# Patient Record
Sex: Female | Born: 1935
Health system: Southern US, Community
[De-identification: ages and names within clinical notes are randomized; demographics above are authoritative.]

## PROBLEM LIST (undated history)

## (undated) DIAGNOSIS — I2510 Atherosclerotic heart disease of native coronary artery without angina pectoris: Secondary | ICD-10-CM

## (undated) DIAGNOSIS — E785 Hyperlipidemia, unspecified: Secondary | ICD-10-CM

## (undated) DIAGNOSIS — M545 Low back pain, unspecified: Secondary | ICD-10-CM

## (undated) DIAGNOSIS — F419 Anxiety disorder, unspecified: Secondary | ICD-10-CM

## (undated) DIAGNOSIS — R413 Other amnesia: Secondary | ICD-10-CM

## (undated) DIAGNOSIS — J189 Pneumonia, unspecified organism: Secondary | ICD-10-CM

## (undated) DIAGNOSIS — Z952 Presence of prosthetic heart valve: Secondary | ICD-10-CM

## (undated) DIAGNOSIS — M1990 Unspecified osteoarthritis, unspecified site: Secondary | ICD-10-CM

## (undated) DIAGNOSIS — J449 Chronic obstructive pulmonary disease, unspecified: Secondary | ICD-10-CM

## (undated) DIAGNOSIS — R918 Other nonspecific abnormal finding of lung field: Secondary | ICD-10-CM

## (undated) DIAGNOSIS — M797 Fibromyalgia: Secondary | ICD-10-CM

## (undated) DIAGNOSIS — Z9849 Cataract extraction status, unspecified eye: Secondary | ICD-10-CM

## (undated) DIAGNOSIS — I350 Nonrheumatic aortic (valve) stenosis: Secondary | ICD-10-CM

## (undated) DIAGNOSIS — I872 Venous insufficiency (chronic) (peripheral): Secondary | ICD-10-CM

## (undated) DIAGNOSIS — K869 Disease of pancreas, unspecified: Secondary | ICD-10-CM

## (undated) HISTORY — DX: Chronic obstructive pulmonary disease, unspecified: J44.9

## (undated) HISTORY — DX: Anxiety disorder, unspecified: F41.9

## (undated) HISTORY — PX: EYE SURGERY: SHX253

## (undated) HISTORY — DX: Venous insufficiency (chronic) (peripheral): I87.2

## (undated) HISTORY — DX: Low back pain: M54.5

## (undated) HISTORY — DX: Hyperlipidemia, unspecified: E78.5

## (undated) HISTORY — PX: LUMBAR LAMINECTOMY: SHX95

## (undated) HISTORY — DX: Cataract extraction status, unspecified eye: Z98.49

## (undated) HISTORY — DX: Other amnesia: R41.3

## (undated) HISTORY — DX: Fibromyalgia: M79.7

## (undated) HISTORY — DX: Low back pain, unspecified: M54.50

## (undated) HISTORY — DX: Disease of pancreas, unspecified: K86.9

## (undated) HISTORY — PX: OTHER SURGICAL HISTORY: SHX169

## (undated) HISTORY — DX: Other nonspecific abnormal finding of lung field: R91.8

## (undated) HISTORY — DX: Unspecified osteoarthritis, unspecified site: M19.90

## (undated) HISTORY — PX: APPENDECTOMY: SHX54

## (undated) HISTORY — PX: CATARACT EXTRACTION: SUR2

## (undated) HISTORY — PX: CORONARY STENT INTERVENTION: CATH118234

## (undated) HISTORY — DX: Nonrheumatic aortic (valve) stenosis: I35.0

## (undated) HISTORY — PX: ABDOMINAL HYSTERECTOMY: SHX81

## (undated) HISTORY — PX: ANTERIOR CERVICAL DISCECTOMY: SHX1160

---

## 1999-01-23 ENCOUNTER — Other Ambulatory Visit: Admission: RE | Admit: 1999-01-23 | Discharge: 1999-01-23 | Payer: Self-pay | Admitting: Obstetrics and Gynecology

## 1999-02-25 ENCOUNTER — Encounter: Payer: Self-pay | Admitting: Obstetrics and Gynecology

## 1999-02-25 ENCOUNTER — Ambulatory Visit (HOSPITAL_COMMUNITY): Admission: RE | Admit: 1999-02-25 | Discharge: 1999-02-25 | Payer: Self-pay | Admitting: Obstetrics and Gynecology

## 2000-07-09 ENCOUNTER — Encounter: Payer: Self-pay | Admitting: Neurosurgery

## 2000-07-09 ENCOUNTER — Ambulatory Visit (HOSPITAL_COMMUNITY): Admission: RE | Admit: 2000-07-09 | Discharge: 2000-07-09 | Payer: Self-pay

## 2000-08-05 ENCOUNTER — Ambulatory Visit (HOSPITAL_COMMUNITY): Admission: RE | Admit: 2000-08-05 | Discharge: 2000-08-05 | Payer: Self-pay | Admitting: Neurosurgery

## 2000-08-05 ENCOUNTER — Encounter: Payer: Self-pay | Admitting: Neurosurgery

## 2000-08-19 ENCOUNTER — Encounter: Payer: Self-pay | Admitting: Neurosurgery

## 2000-08-19 ENCOUNTER — Ambulatory Visit (HOSPITAL_COMMUNITY): Admission: RE | Admit: 2000-08-19 | Discharge: 2000-08-19 | Payer: Self-pay | Admitting: Neurosurgery

## 2000-09-07 ENCOUNTER — Encounter: Payer: Self-pay | Admitting: Neurosurgery

## 2000-09-07 ENCOUNTER — Ambulatory Visit (HOSPITAL_COMMUNITY): Admission: RE | Admit: 2000-09-07 | Discharge: 2000-09-07 | Payer: Self-pay | Admitting: Neurosurgery

## 2002-05-19 ENCOUNTER — Emergency Department (HOSPITAL_COMMUNITY): Admission: EM | Admit: 2002-05-19 | Discharge: 2002-05-20 | Payer: Self-pay | Admitting: Emergency Medicine

## 2002-05-20 ENCOUNTER — Encounter: Payer: Self-pay | Admitting: Emergency Medicine

## 2002-05-28 ENCOUNTER — Encounter: Payer: Self-pay | Admitting: Neurosurgery

## 2002-05-28 ENCOUNTER — Ambulatory Visit (HOSPITAL_COMMUNITY): Admission: RE | Admit: 2002-05-28 | Discharge: 2002-05-28 | Payer: Self-pay | Admitting: Neurosurgery

## 2002-06-17 ENCOUNTER — Encounter: Payer: Self-pay | Admitting: Neurosurgery

## 2002-06-21 ENCOUNTER — Encounter: Payer: Self-pay | Admitting: Neurosurgery

## 2002-06-21 ENCOUNTER — Inpatient Hospital Stay (HOSPITAL_COMMUNITY): Admission: RE | Admit: 2002-06-21 | Discharge: 2002-06-23 | Payer: Self-pay | Admitting: Neurosurgery

## 2002-09-06 ENCOUNTER — Ambulatory Visit (HOSPITAL_COMMUNITY): Admission: RE | Admit: 2002-09-06 | Discharge: 2002-09-06 | Payer: Self-pay | Admitting: Pulmonary Disease

## 2002-09-06 ENCOUNTER — Encounter: Payer: Self-pay | Admitting: Pulmonary Disease

## 2004-09-21 ENCOUNTER — Ambulatory Visit: Payer: Self-pay | Admitting: Internal Medicine

## 2004-09-24 ENCOUNTER — Ambulatory Visit: Payer: Self-pay | Admitting: Pulmonary Disease

## 2004-10-15 ENCOUNTER — Ambulatory Visit: Payer: Self-pay | Admitting: Pulmonary Disease

## 2005-07-08 ENCOUNTER — Ambulatory Visit: Payer: Self-pay | Admitting: Pulmonary Disease

## 2005-09-25 ENCOUNTER — Ambulatory Visit: Payer: Self-pay | Admitting: Pulmonary Disease

## 2005-10-22 ENCOUNTER — Ambulatory Visit: Payer: Self-pay | Admitting: Pulmonary Disease

## 2006-05-11 ENCOUNTER — Ambulatory Visit (HOSPITAL_COMMUNITY): Admission: RE | Admit: 2006-05-11 | Discharge: 2006-05-11 | Payer: Self-pay | Admitting: Pulmonary Disease

## 2006-05-22 ENCOUNTER — Encounter: Admission: RE | Admit: 2006-05-22 | Discharge: 2006-05-22 | Payer: Self-pay | Admitting: Pulmonary Disease

## 2006-06-23 ENCOUNTER — Ambulatory Visit (HOSPITAL_COMMUNITY): Admission: RE | Admit: 2006-06-23 | Discharge: 2006-06-23 | Payer: Self-pay | Admitting: Neurosurgery

## 2006-07-01 ENCOUNTER — Ambulatory Visit (HOSPITAL_COMMUNITY): Admission: RE | Admit: 2006-07-01 | Discharge: 2006-07-01 | Payer: Self-pay | Admitting: Neurosurgery

## 2006-07-20 ENCOUNTER — Ambulatory Visit (HOSPITAL_COMMUNITY): Admission: RE | Admit: 2006-07-20 | Discharge: 2006-07-20 | Payer: Self-pay | Admitting: Neurosurgery

## 2006-09-23 ENCOUNTER — Ambulatory Visit: Payer: Self-pay | Admitting: Pulmonary Disease

## 2006-10-27 HISTORY — PX: OTHER SURGICAL HISTORY: SHX169

## 2006-11-26 ENCOUNTER — Encounter: Admission: RE | Admit: 2006-11-26 | Discharge: 2006-11-26 | Payer: Self-pay | Admitting: Orthopedic Surgery

## 2006-11-30 ENCOUNTER — Ambulatory Visit (HOSPITAL_BASED_OUTPATIENT_CLINIC_OR_DEPARTMENT_OTHER): Admission: RE | Admit: 2006-11-30 | Discharge: 2006-12-01 | Payer: Self-pay | Admitting: Orthopedic Surgery

## 2007-02-05 ENCOUNTER — Ambulatory Visit: Payer: Self-pay | Admitting: Pulmonary Disease

## 2007-02-05 LAB — CONVERTED CEMR LAB
ALT: 12 units/L (ref 0–40)
Albumin: 3.6 g/dL (ref 3.5–5.2)
Alkaline Phosphatase: 117 units/L (ref 39–117)
BUN: 12 mg/dL (ref 6–23)
Basophils Relative: 0 % (ref 0.0–1.0)
Calcium: 9.2 mg/dL (ref 8.4–10.5)
Chloride: 106 meq/L (ref 96–112)
Glucose, Bld: 103 mg/dL — ABNORMAL HIGH (ref 70–99)
Hemoglobin: 13.6 g/dL (ref 12.0–15.0)
MCHC: 35.3 g/dL (ref 30.0–36.0)
MCV: 91 fL (ref 78.0–100.0)
Monocytes Absolute: 0.4 10*3/uL (ref 0.2–0.7)
Monocytes Relative: 3 % (ref 3.0–11.0)
Neutro Abs: 11.6 10*3/uL — ABNORMAL HIGH (ref 1.4–7.7)
Platelets: 343 10*3/uL (ref 150–400)
Potassium: 3.9 meq/L (ref 3.5–5.1)
RBC: 4.24 M/uL (ref 3.87–5.11)
RDW: 12.7 % (ref 11.5–14.6)
Total Bilirubin: 0.8 mg/dL (ref 0.3–1.2)
WBC: 13.2 10*3/uL — ABNORMAL HIGH (ref 4.5–10.5)

## 2007-02-16 ENCOUNTER — Ambulatory Visit: Payer: Self-pay | Admitting: Pulmonary Disease

## 2007-05-24 ENCOUNTER — Encounter: Admission: RE | Admit: 2007-05-24 | Discharge: 2007-05-24 | Payer: Self-pay | Admitting: Pulmonary Disease

## 2007-06-15 ENCOUNTER — Ambulatory Visit: Payer: Self-pay | Admitting: Pulmonary Disease

## 2007-07-14 ENCOUNTER — Emergency Department (HOSPITAL_COMMUNITY): Admission: EM | Admit: 2007-07-14 | Discharge: 2007-07-14 | Payer: Self-pay | Admitting: Emergency Medicine

## 2007-08-31 ENCOUNTER — Ambulatory Visit: Payer: Self-pay | Admitting: Pulmonary Disease

## 2007-09-07 ENCOUNTER — Ambulatory Visit: Payer: Self-pay | Admitting: Pulmonary Disease

## 2007-09-08 LAB — CONVERTED CEMR LAB: Creatinine, Ser: 0.7 mg/dL (ref 0.4–1.2)

## 2007-09-09 ENCOUNTER — Ambulatory Visit (HOSPITAL_COMMUNITY): Admission: RE | Admit: 2007-09-09 | Discharge: 2007-09-09 | Payer: Self-pay | Admitting: Pulmonary Disease

## 2007-10-19 ENCOUNTER — Encounter: Payer: Self-pay | Admitting: Pulmonary Disease

## 2007-12-14 DIAGNOSIS — R42 Dizziness and giddiness: Secondary | ICD-10-CM | POA: Insufficient documentation

## 2007-12-14 DIAGNOSIS — F411 Generalized anxiety disorder: Secondary | ICD-10-CM

## 2007-12-14 DIAGNOSIS — Z87448 Personal history of other diseases of urinary system: Secondary | ICD-10-CM | POA: Insufficient documentation

## 2007-12-15 ENCOUNTER — Ambulatory Visit: Payer: Self-pay | Admitting: Pulmonary Disease

## 2007-12-15 DIAGNOSIS — R079 Chest pain, unspecified: Secondary | ICD-10-CM

## 2007-12-15 DIAGNOSIS — H5501 Congenital nystagmus: Secondary | ICD-10-CM | POA: Insufficient documentation

## 2007-12-15 DIAGNOSIS — M545 Low back pain: Secondary | ICD-10-CM

## 2007-12-15 DIAGNOSIS — J209 Acute bronchitis, unspecified: Secondary | ICD-10-CM | POA: Insufficient documentation

## 2007-12-15 DIAGNOSIS — I872 Venous insufficiency (chronic) (peripheral): Secondary | ICD-10-CM | POA: Insufficient documentation

## 2007-12-15 DIAGNOSIS — IMO0001 Reserved for inherently not codable concepts without codable children: Secondary | ICD-10-CM

## 2007-12-19 DIAGNOSIS — R413 Other amnesia: Secondary | ICD-10-CM | POA: Insufficient documentation

## 2008-02-17 ENCOUNTER — Encounter: Payer: Self-pay | Admitting: Pulmonary Disease

## 2008-06-12 ENCOUNTER — Ambulatory Visit: Payer: Self-pay | Admitting: Pulmonary Disease

## 2008-06-12 DIAGNOSIS — M1990 Unspecified osteoarthritis, unspecified site: Secondary | ICD-10-CM | POA: Insufficient documentation

## 2008-06-12 LAB — CONVERTED CEMR LAB
ALT: 12 units/L (ref 0–35)
AST: 17 units/L (ref 0–37)
Albumin: 3.9 g/dL (ref 3.5–5.2)
Alkaline Phosphatase: 112 units/L (ref 39–117)
Basophils Absolute: 0 10*3/uL (ref 0.0–0.1)
Basophils Relative: 0.5 % (ref 0.0–3.0)
Bilirubin, Direct: 0.1 mg/dL (ref 0.0–0.3)
Creatinine, Ser: 0.9 mg/dL (ref 0.4–1.2)
Eosinophils Absolute: 0.1 10*3/uL (ref 0.0–0.7)
Eosinophils Relative: 2 % (ref 0.0–5.0)
GFR calc Af Amer: 77 mL/min
GFR calc non Af Amer: 64 mL/min
MCV: 93.4 fL (ref 78.0–100.0)
Monocytes Relative: 6.8 % (ref 3.0–12.0)
Platelets: 272 10*3/uL (ref 150–400)
RDW: 12.4 % (ref 11.5–14.6)
Sodium: 143 meq/L (ref 135–145)
TSH: 2.24 microintl units/mL (ref 0.35–5.50)
Total Bilirubin: 0.8 mg/dL (ref 0.3–1.2)
Total CHOL/HDL Ratio: 5.1
Triglycerides: 113 mg/dL (ref 0–149)
VLDL: 23 mg/dL (ref 0–40)

## 2008-07-06 ENCOUNTER — Encounter: Payer: Self-pay | Admitting: Pulmonary Disease

## 2008-07-17 ENCOUNTER — Encounter: Admission: RE | Admit: 2008-07-17 | Discharge: 2008-07-17 | Payer: Self-pay | Admitting: Pulmonary Disease

## 2008-07-19 ENCOUNTER — Encounter: Payer: Self-pay | Admitting: Pulmonary Disease

## 2008-07-19 ENCOUNTER — Encounter: Admission: RE | Admit: 2008-07-19 | Discharge: 2008-07-19 | Payer: Self-pay | Admitting: Pulmonary Disease

## 2008-08-09 LAB — CONVERTED CEMR LAB: Vit D, 1,25-Dihydroxy: 27 — ABNORMAL LOW (ref 30–89)

## 2008-08-16 ENCOUNTER — Ambulatory Visit: Payer: Self-pay | Admitting: Pulmonary Disease

## 2008-10-31 ENCOUNTER — Telehealth (INDEPENDENT_AMBULATORY_CARE_PROVIDER_SITE_OTHER): Payer: Self-pay | Admitting: *Deleted

## 2008-12-11 ENCOUNTER — Ambulatory Visit: Payer: Self-pay | Admitting: Pulmonary Disease

## 2008-12-17 LAB — CONVERTED CEMR LAB
ALT: 12 units/L (ref 0–35)
Alkaline Phosphatase: 105 units/L (ref 39–117)
BUN: 12 mg/dL (ref 6–23)
Basophils Absolute: 0.1 10*3/uL (ref 0.0–0.1)
Basophils Relative: 1 % (ref 0.0–3.0)
CO2: 30 meq/L (ref 19–32)
Calcium: 10 mg/dL (ref 8.4–10.5)
Cholesterol: 179 mg/dL (ref 0–200)
Creatinine, Ser: 0.6 mg/dL (ref 0.4–1.2)
Eosinophils Absolute: 0.1 10*3/uL (ref 0.0–0.7)
GFR calc non Af Amer: 102 mL/min
HCT: 37.8 % (ref 36.0–46.0)
HDL: 40.8 mg/dL (ref >39.0)
Hemoglobin: 13.1 g/dL (ref 12.0–15.0)
LDL Cholesterol: 117 mg/dL — ABNORMAL HIGH (ref 0–99)
Neutro Abs: 4.2 10*3/uL (ref 1.4–7.7)
RDW: 12.5 % (ref 11.5–14.6)
Sodium: 143 meq/L (ref 135–145)
Total CHOL/HDL Ratio: 4.4
Total Protein: 6.8 g/dL (ref 6.0–8.3)
WBC: 6.2 10*3/uL (ref 4.5–10.5)

## 2009-01-14 DIAGNOSIS — E785 Hyperlipidemia, unspecified: Secondary | ICD-10-CM

## 2009-01-14 DIAGNOSIS — E559 Vitamin D deficiency, unspecified: Secondary | ICD-10-CM | POA: Insufficient documentation

## 2009-02-13 ENCOUNTER — Ambulatory Visit: Payer: Self-pay | Admitting: Pulmonary Disease

## 2009-02-13 ENCOUNTER — Telehealth: Payer: Self-pay | Admitting: Pulmonary Disease

## 2009-02-13 LAB — CONVERTED CEMR LAB
Nitrite: NEGATIVE
Specific Gravity, Urine: 1.025 (ref 1.000–1.030)
Urine Glucose: NEGATIVE mg/dL
Urobilinogen, UA: 0.2 (ref 0.0–1.0)
pH: 5.5 (ref 5.0–8.0)

## 2009-02-14 ENCOUNTER — Encounter: Payer: Self-pay | Admitting: Pulmonary Disease

## 2009-03-06 ENCOUNTER — Encounter: Payer: Self-pay | Admitting: Pulmonary Disease

## 2009-04-26 HISTORY — PX: OTHER SURGICAL HISTORY: SHX169

## 2009-05-25 ENCOUNTER — Encounter: Payer: Self-pay | Admitting: Pulmonary Disease

## 2009-07-06 ENCOUNTER — Encounter: Payer: Self-pay | Admitting: Adult Health

## 2009-07-06 ENCOUNTER — Ambulatory Visit: Payer: Self-pay | Admitting: Internal Medicine

## 2009-07-10 ENCOUNTER — Telehealth (INDEPENDENT_AMBULATORY_CARE_PROVIDER_SITE_OTHER): Payer: Self-pay | Admitting: *Deleted

## 2009-07-10 LAB — CONVERTED CEMR LAB
AST: 24 units/L (ref 0–37)
Alkaline Phosphatase: 114 units/L (ref 39–117)
BUN: 10 mg/dL (ref 6–23)
Basophils Absolute: 0 10*3/uL (ref 0.0–0.1)
Basophils Relative: 0.7 % (ref 0.0–3.0)
Bilirubin Urine: NEGATIVE
Bilirubin, Direct: 0.1 mg/dL (ref 0.0–0.3)
CO2: 32 meq/L (ref 19–32)
Hemoglobin, Urine: NEGATIVE
Hemoglobin: 12.4 g/dL (ref 12.0–15.0)
Ketones, ur: NEGATIVE mg/dL
Lymphocytes Relative: 25.1 % (ref 12.0–46.0)
MCHC: 33.7 g/dL (ref 30.0–36.0)
Monocytes Relative: 7.8 % (ref 3.0–12.0)
Neutrophils Relative %: 64.8 % (ref 43.0–77.0)
Nitrite: NEGATIVE
Platelets: 302 10*3/uL (ref 150.0–400.0)
Potassium: 3.9 meq/L (ref 3.5–5.1)
Sodium: 147 meq/L — ABNORMAL HIGH (ref 135–145)
TSH: 1.13 microintl units/mL (ref 0.35–5.50)
Total Protein: 7 g/dL (ref 6.0–8.3)
Urobilinogen, UA: 0.2 (ref 0.0–1.0)
Vit D, 25-Hydroxy: 24 ng/mL — ABNORMAL LOW (ref 30–89)
WBC: 4.8 10*3/uL (ref 4.5–10.5)

## 2009-08-01 ENCOUNTER — Telehealth: Payer: Self-pay | Admitting: Pulmonary Disease

## 2009-09-11 ENCOUNTER — Encounter: Payer: Self-pay | Admitting: Pulmonary Disease

## 2009-09-27 ENCOUNTER — Encounter: Payer: Self-pay | Admitting: Pulmonary Disease

## 2009-10-12 ENCOUNTER — Ambulatory Visit: Payer: Self-pay | Admitting: Pulmonary Disease

## 2009-11-23 ENCOUNTER — Encounter: Payer: Self-pay | Admitting: Pulmonary Disease

## 2010-01-09 ENCOUNTER — Encounter: Payer: Self-pay | Admitting: Pulmonary Disease

## 2010-01-09 ENCOUNTER — Encounter: Admission: RE | Admit: 2010-01-09 | Discharge: 2010-01-09 | Payer: Self-pay | Admitting: Pulmonary Disease

## 2010-01-16 ENCOUNTER — Telehealth (INDEPENDENT_AMBULATORY_CARE_PROVIDER_SITE_OTHER): Payer: Self-pay | Admitting: *Deleted

## 2010-02-26 ENCOUNTER — Encounter: Payer: Self-pay | Admitting: Pulmonary Disease

## 2010-04-10 ENCOUNTER — Ambulatory Visit: Payer: Self-pay | Admitting: Pulmonary Disease

## 2010-06-12 ENCOUNTER — Observation Stay (HOSPITAL_COMMUNITY): Admission: EM | Admit: 2010-06-12 | Discharge: 2010-06-14 | Payer: Self-pay | Admitting: Emergency Medicine

## 2010-06-13 ENCOUNTER — Ambulatory Visit: Payer: Self-pay | Admitting: Cardiology

## 2010-06-13 ENCOUNTER — Encounter (INDEPENDENT_AMBULATORY_CARE_PROVIDER_SITE_OTHER): Payer: Self-pay | Admitting: Internal Medicine

## 2010-07-16 ENCOUNTER — Telehealth (INDEPENDENT_AMBULATORY_CARE_PROVIDER_SITE_OTHER): Payer: Self-pay | Admitting: *Deleted

## 2010-07-18 ENCOUNTER — Ambulatory Visit: Payer: Self-pay | Admitting: Pulmonary Disease

## 2010-07-24 ENCOUNTER — Telehealth (INDEPENDENT_AMBULATORY_CARE_PROVIDER_SITE_OTHER): Payer: Self-pay | Admitting: *Deleted

## 2010-07-25 ENCOUNTER — Encounter: Payer: Self-pay | Admitting: Cardiology

## 2010-07-25 ENCOUNTER — Encounter: Payer: Self-pay | Admitting: Internal Medicine

## 2010-07-25 ENCOUNTER — Ambulatory Visit: Payer: Self-pay | Admitting: Cardiology

## 2010-07-25 ENCOUNTER — Ambulatory Visit: Payer: Self-pay

## 2010-07-25 ENCOUNTER — Encounter (HOSPITAL_COMMUNITY): Admission: RE | Admit: 2010-07-25 | Discharge: 2010-08-02 | Payer: Self-pay | Admitting: Pulmonary Disease

## 2010-08-14 ENCOUNTER — Ambulatory Visit: Payer: Self-pay | Admitting: Internal Medicine

## 2010-09-30 ENCOUNTER — Telehealth (INDEPENDENT_AMBULATORY_CARE_PROVIDER_SITE_OTHER): Payer: Self-pay | Admitting: *Deleted

## 2010-10-07 ENCOUNTER — Ambulatory Visit: Payer: Self-pay | Admitting: Pulmonary Disease

## 2010-11-01 ENCOUNTER — Telehealth (INDEPENDENT_AMBULATORY_CARE_PROVIDER_SITE_OTHER): Payer: Self-pay | Admitting: *Deleted

## 2010-11-17 ENCOUNTER — Encounter: Payer: Self-pay | Admitting: Pulmonary Disease

## 2010-11-19 ENCOUNTER — Ambulatory Visit
Admission: RE | Admit: 2010-11-19 | Discharge: 2010-11-19 | Payer: Self-pay | Source: Home / Self Care | Attending: Internal Medicine | Admitting: Internal Medicine

## 2010-11-19 ENCOUNTER — Encounter: Payer: Self-pay | Admitting: Internal Medicine

## 2010-11-19 DIAGNOSIS — I2510 Atherosclerotic heart disease of native coronary artery without angina pectoris: Secondary | ICD-10-CM | POA: Insufficient documentation

## 2010-11-26 NOTE — Assessment & Plan Note (Signed)
Summary: post hosp f/u appt/ ok per TD/mg   CC:  3 month ROV & post hosp check....  History of Present Illness: 77 y/o WF here for a follow up visit... she has multiple medical problems as noted below...     ~  saw DrDeveshwar in 4/09 w/ right shoulder bursitis and given shot- prev hx of right shoulder surg in 2008 by DrWainer... she has fallen x2 w/ ?repeat injury...  ~  saw ENT Sep09 w/ decr hearing- left cerumen impaction removed...  ~  had Mammogram Sep09 w/ ? left breast mass- w/u revealed 8mm cyst in left breast- f/u 70yr.   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 still has some right shoulder pain and they ?want second opinion at [facility]...  ~  Dec10:  she had right shoulder surg- arthroplasty by DrWeisler, Pollyann Savoy 7/10... doing better after PT etc... also followed regularly by DrDeveshwar for Pseudogout (CPPD), OA, DDD, etc.. on DCN100, Flexeril Prn, etc... DrD will be switching to another analgesic when the [facility] runs out... also takes Caltrate, MVI, Vit D 1000u/d and she notes marked clinical improvement on the Vit d supplement.   ~  April 10, 2010:  she's had some right knee pain w/ shot from [employer] & improved... saw DrDeveshwar 5/11 & stable on her Colchicine for CPPD, Vicodin & OTC NSAIDs Prn pain, & Flex for muscle spasm... 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   ~  July 18, 2010:  she was hosp 8/17-19/11by [facility] for CP- severe SSCP w/ N&V, NTG helped in ER & EKG w/ poor R progression/ NAD... pain was somewhat atyp & it hurt to breathe (neg CTAngio), but no apparent CWP/ tender... Enz were neg etc- and we discussed poss further cardiac eval w/ Myoview & she would like to proceed w/ this testing...  Risk Factors: +- FamHx, ex-smoker, LDL ~117 on diet alone, neg HBP/ DM... no recurrent CP since disch- she has Vicodin for Prn use.    Current  Problems:  CONGENITAL NYSTAGMUS (ICD-379.51)  ASTHMATIC BRONCHITIS, ACUTE (ICD-466.0) - she has stopped her prev Advair therapy and denies any prob w/ cough, sputum, dyspnea, etc... she states doing well- just wants a ZPak for Prn use.  ~  she is an ex-smoker having smoked from age 67 to 64 up to 2ppd, "but I just puffed, didn't inhale"  ~  baseline CXR 8/08 showed clear lungs x scarring left base & DJD left shoulder...  ~  PFT's 11/05 showed FVC= 2.21 (81%), FEV1=1.35 (68%), %1sec=61, mid-flows=30%pred...  ~  CT Angio Chest 8/11 showed 5mm nodule RLL w/ fu CT in 25mo rec...  Hx of CHEST PAIN  (ICD-786.50) - ** see 8/17-19/11 hosp ** EKG, Enz, 2DEcho, CTA...  VENOUS INSUFFICIENCY (ICD-459.81) - VI changes without swelling... prev tinea pedis on right foot betw 4th-5th toes has resolved w/ Lotrisone cream...  HYPERCHOLESTEROLEMIA, BORDERLINE (ICD-272.4) - on diet alone...  ~  FLP 2/10 showed TChol 179, TG 108, HDL 41, LDL 117... rec> better diet, incr exerc.  UTI'S, HX OF (ICD-V13.00) - CT Angio chest 8/11 in hosp also showed ? hydronephrosis vs parapelvic cysts> subseq CT Abd confirmed cysts...  DEGENERATIVE JOINT DISEASE (ICD-715.90) - she is followed by DrDeveshwar for Rheum w/ Pseudogout (CPPD) on COLCHICINE 0.6mg /d; Osteoarthritis w/ right shoulder arthroplasty 7/10 at [facility]; & DDD... also followed by DrKramer who treated  her left hand fracture after a fall last yr...  she takes VICODIN Prn, FLEXERIL Prn, & COLCHICINE 0.6mg /d...  LOW BACK PAIN SYNDROME (ICD-724.2) - she had prev Lumbar Lam... she uses VICODIN and FLEXERIL as needed... she states that she is stable without new complaints or concerns...  FIBROMYALGIA (ICD-729.1) - followed by DrDeveshwar and her notes are reviewed...  VITAMIN D DEFICIENCY (ICD-268.9) - on Vit D 1000 u OTC daily...  ~  labs 8/09 showed Vit D level = 27... rec> start OTC Vit D supplement.  ~  labs 9/10 showed Vit D level = 24... rec> take Vit D 1000 u  daily.  MEMORY LOSS (ICD-780.93) - she notes poor memory and prev MRIBrain 11/08 showed old lacune in right thalamus, sm vessel dis, atrophy... she takes ASA 81mg /d...  Hx of VERTIGO (ICD-780.4) - uses Meclizine as needed...  ANXIETY (ICD-300.00) - she takes LEXAPRO 10mg /d and ALPRAZOLAM 0.5mg Tid Prn... both of which help her mood and anxiety...  [id number]   [facility]bear  Preventive Screening-Counseling & Management  Alcohol-Tobacco     Smoking Status: quit     Packs/Day: 2.0     Year Quit: 1993  Allergies (verified): No Known Drug Allergies  Comments:  Nurse/Medical Assistant: The patient's medications and allergies were reviewed with the patient and were updated in the Medication and Allergy Lists.  Past History:  Past Medical History: CONGENITAL NYSTAGMUS (ICD-379.51) ASTHMATIC BRONCHITIS, ACUTE (ICD-466.0) Hx of CHEST PAIN UNSPECIFIED (ICD-786.50)   Hosp 8/11 w/ CP- r/o for MI, & referred to Cards for further eval. VENOUS INSUFFICIENCY (ICD-459.81) HYPERCHOLESTEROLEMIA, BORDERLINE (ICD-272.4) UTI'S, HX OF (ICD-V13.00) DEGENERATIVE JOINT DISEASE (ICD-715.90) LOW BACK PAIN SYNDROME (ICD-724.2) FIBROMYALGIA (ICD-729.1) VITAMIN D DEFICIENCY (ICD-268.9) MEMORY LOSS (ICD-780.93) Hx of VERTIGO (ICD-780.4) ANXIETY (ICD-300.00)  Past Surgical History: Cataract extraction Hysterectomy S/P Anterior Cervical Discectomy S/P Lumbar Laminectomy S/P right knee arthroscopy S/P right shoulder surgery 2008 by DrWainer s/p right shoulder replacement 05/22/09  Family History: Reviewed history from 06/12/2008 and no changes required. mother deceased age 53 from broken hip father deceased age 80 from kidney problems 1 sibling alive age 54 1 sibling alive age 11 1 sibling deceased age 70 1 sibling alive age 13  Social History: Reviewed history from 06/12/2008 and no changes required. quit smoking in 1993 exposed to second hand smoke exercise sometimes 3 cups caffeine  daily widowed 3 children Packs/Day:  2.0  Review of Systems      See HPI       The patient complains of dyspnea on exertion.  The patient denies anorexia, fever, weight loss, weight gain, vision loss, decreased hearing, hoarseness, chest pain, syncope, peripheral edema, prolonged cough, headaches, hemoptysis, abdominal pain, melena, hematochezia, severe indigestion/heartburn, hematuria, incontinence, muscle weakness, suspicious skin lesions, transient blindness, difficulty walking, depression, unusual weight change, abnormal bleeding, enlarged lymph nodes, and angioedema.    Vital Signs:  Patient profile:   77 year old female Height:      65 inches Weight:      163 pounds O2 Sat:      95 % on Room air Temp:     96.8 degrees F oral Pulse rate:   75 / minute BP sitting:   114 / 68  (left arm) Cuff size:   regular  Vitals Entered By: Randell Loop CMA (July 18, 2010 10:10 AM)  O2 Sat at Rest %:  95 O2 Flow:  Room air CC: 3 month ROV & post hosp check... Is Patient Diabetic? No Pain Assessment Patient in pain? no  Comments no changes in meds today   Physical Exam  Additional Exam:  WD, WN, 77 y/o WF in NAD... GENERAL:  Alert & oriented; pleasant & cooperative... HEENT:  Blanding/AT, EOM-wnl, PERRLA w/ nystagmus, EACs-clear, TMs-wnl, NOSE-clear, THROAT-clear & wnl. NECK:  Supple w/ decr ROM; no JVD; normal carotid impulses w/o bruits; no thyromegaly or nodules palpated; no lymphadenopathy. CHEST:  Clear to P & A; without wheezes/ rales/ or rhonchi. HEART:  Regular Rhythm; without murmurs/ rubs/ or gallops. ABDOMEN:  Soft & nontender; normal bowel sounds; no organomegaly or masses detected. EXT: without deformities, mild arthritic changes; no varicose veins/ +venous insuffic/ tr edema. right shoulder well healed surgical scar & can raise right arm mid level... NEURO:  CN's intact; she has congenital nystagmus, no focal neuro deficits... DERM:  No lesions noted; no rash  etc...    MISC. Report  Procedure date:  07/18/2010  Findings:      DATA REVIEWED:  ~  Hosp data from 8/11: H&P, DCSummary, XRays, Scans, Echo, & Labs all reviewed w/ pt...   Impression & Recommendations:  Problem # 1:  Hx of CHEST PAIN UNSPECIFIED (ICD-786.50) She would like Cardiac eval w/ Myoview to see if further eval warranted...  She has Vicodin for CP... Orders: Cardiology Referral (Cardiology)  Problem # 2:  ASTHMATIC BRONCHITIS, ACUTE (ICD-466.0) Breathing is stable> w/o acute exac...  Problem # 3:  HYPERCHOLESTEROLEMIA, BORDERLINE (ICD-272.4) We reviewed diet + exercise recs for control of lipids> low chol/ low fat...  Problem # 4:  UTI'S, HX OF (ICD-V13.00) Renal cysts on CT Abd...  Problem # 5:  DEGENERATIVE JOINT DISEASE (ICD-715.90) Followed by Rober Minion for Rheum>  DJD, CPPD, & suspected FM... Her updated medication list for this problem includes:    Adult Aspirin Low Strength 81 Mg Tbdp (Aspirin) .Marland Kitchen... 1 tab daily...    Vicodin 5-500 Mg Tabs (Hydrocodone-acetaminophen) .Marland Kitchen... Take 1 tab every 6 h as needed for pain...  Problem # 6:  ANXIETY (ICD-300.00) Continue same meds... Her updated medication list for this problem includes:    Lexapro 10 Mg Tabs (Escitalopram oxalate) .Marland Kitchen... Take 1 tablet by mouth once a day    Alprazolam 0.5 Mg Tabs (Alprazolam) .Marland Kitchen... Take 1/2 to 1 tablet by mouth three times a day as needed for nerves... (not to exceed 3 per day)  Problem # 7:  OTHER MEDICAL PROBLEMS AS NOTED>>> OK Flu shot today... she requests Rx for Shingles vaccine as well...  Complete Medication List: 1)  Adult Aspirin Low Strength 81 Mg Tbdp (Aspirin) .Marland Kitchen.. 1 tab daily.Marland KitchenMarland Kitchen 2)  Colchicine 0.6 Mg Tabs (Colchicine) .... Take 1 tab by mouth once daily per drdeveshwar... 3)  Flexeril 10 Mg Tabs (Cyclobenzaprine hcl) .... 1/2 to 1 tab by mouth three times a day as directed for muscle spasm... 4)  Meclizine Hcl 25 Mg Tabs (Meclizine hcl) .... 1/2 to 1 tab by mouth  three times a day as needed for dizziness... 5)  Lexapro 10 Mg Tabs (Escitalopram oxalate) .... Take 1 tablet by mouth once a day 6)  Alprazolam 0.5 Mg Tabs (Alprazolam) .... Take 1/2 to 1 tablet by mouth three times a day as needed for nerves... (not to exceed 3 per day) 7)  Caltrate 600+d Plus 600-400 Mg-unit Tabs (Calcium carbonate-vit d-min) .... Take 1 tab by mouth once daily.Marland KitchenMarland Kitchen 8)  Womens Multivitamin Plus Tabs (Multiple vitamins-minerals) .... Take one tab daily.Marland KitchenMarland Kitchen 9)  Vitamin D 1000 Unit Tabs (Cholecalciferol) .... Take 1 tab daily... 10)  Vicodin 5-500 Mg  Tabs (Hydrocodone-acetaminophen) .... Take 1 tab every 6 h as needed for pain... 11)  Shingles Vaccine  .... Administer shingles vaccine...  Other Orders: Flu Vaccine 71yrs + MEDICARE PATIENTS (Z6109) Administration Flu vaccine - MCR (U0454)  Patient Instructions: 1)  Today we updated your med list- see below.... 2)  We will arrange for a Cardiology appt & ask them to do a Myoview scan to further evaluate your chest pain episode... 3)  Today we gave you the 2011 Flu vaccine... 4)  We also wrote a perscription for the Shingles vaccine which you can get at the [facility] at your convenience... 5)  Call for any questions.Marland Kitchen 6)  Keep our planned f/u appt in Dec... Prescriptions: SHINGLES VACCINE Administer shingles vaccine...  #1 x 0   Entered and Authorized by:   Michele Mcalpine MD   Signed by:   Michele Mcalpine MD on 07/18/2010   Method used:   Print then Give to Patient   RxID:   [id number]  Flu Vaccine Consent Questions     Do you have a history of severe allergic reactions to this vaccine? no    Any prior history of allergic reactions to egg and/or gelatin? no    Do you have a sensitivity to the preservative Thimersol? no    Do you have a past history of Guillan-Barre Syndrome? no    Do you currently have an acute febrile illness? no    Have you ever had a severe reaction to latex? no    Vaccine information given and  explained to patient? yes    Are you currently pregnant? no    Lot Number:[id number]   Exp Date:04/26/2011   Site Given  Left Deltoid Sallye Lat CMA  July 18, 2010 11:14 AM    .lbmedflu

## 2010-11-26 NOTE — Letter (Signed)
Summary: [facility]  [facility]   Imported By: Sherian Rein 12/07/2009 13:12:10  _____________________________________________________________________  External Attachment:    Type:   Image     Comment:   External Document

## 2010-11-26 NOTE — Progress Notes (Signed)
Summary: cough  Phone Note Call from Patient Call back at [phone]   Caller: Daughter//sonja Call For: nadel Summary of Call: Pt c/o coughing since sat, wants an abx pls advise.//[facility] Initial call taken by: Darletta Moll,  September 30, 2010 3:17 PM  Follow-up for Phone Call        Spoke with pt's daughter.  She states that pt has started to have prod cough with yellow sputum, head and congesiton x 2 days.  Denies any wheeze, SOB or fever.  She states that she is already taking mucinex dm two times a day.  Pls advise thanks! NKDA Follow-up by: Vernie Murders,  September 30, 2010 3:40 PM  Additional Follow-up for Phone Call Additional follow up Details #1::        per SN----ok for pt to have augmentin 875mg    #14  1 by mouth two times a day until gone.  thanks Randell Loop CMA  September 30, 2010 4:20 PM     Additional Follow-up for Phone Call Additional follow up Details #2::    Rx was sent to pharm.  Spoke with pt's daughter and notified this was done. Follow-up by: Vernie Murders,  September 30, 2010 4:24 PM  New/Updated Medications: AUGMENTIN 875-125 MG TABS (AMOXICILLIN-POT CLAVULANATE) 1 by mouth two times a day until gone Prescriptions: AUGMENTIN 875-125 MG TABS (AMOXICILLIN-POT CLAVULANATE) 1 by mouth two times a day until gone  #14 x 0   Entered by:   Vernie Murders   Authorized by:   Michele Mcalpine MD   Signed by:   Vernie Murders on 09/30/2010   Method used:   Electronically to        [facility] ([facility]ail[facility]       [address]       [place], [place]  [address]       Ph: [phone]       Fax: [phone]   RxID:   [id number]

## 2010-11-26 NOTE — Assessment & Plan Note (Signed)
Summary: new pt   Visit Type:  Initial Consult Primary Provider:  Lorin Picket Nadel,M.D.  CC:  Patient is here for f/u stress test and chest pain and shortness of breath..  History of Present Illness: Jessica [name] is an 77 y/o woman with h/o HL, fibromyalgia, anxiety and COPD (recently quit smoking).  Denies any h/o known CAD. In August 2011, admitted to [facility] with chest pressure and nausea. ECG and CE normal. D-dimer mildly elevated. CT scan negative for PE.  Had post hospital Myoview with normal EF and question of inferior ischemia. Referred by Dr. Kriste Basque to discuss results.   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 does get fatigued. No palpitations, CHF or syncope. Does have severe arthritis pain especially in R   Recent carotid u/s without signicant plaque per daughter's report.  Current Medications (verified): 1)  Adult Aspirin Low Strength 81 Mg  Tbdp (Aspirin) .Marland Kitchen.. 1 Tab Daily.Marland KitchenMarland Kitchen 2)  Colchicine 0.6 Mg Tabs (Colchicine) .... Take 1 Tab By Mouth Once Daily Per Drdeveshwar... 3)  Flexeril 10 Mg Tabs (Cyclobenzaprine Hcl) .... 1/2 To 1 Tab By Mouth Three Times A Day As Directed For Muscle Spasm... 4)  Meclizine Hcl 25 Mg  Tabs (Meclizine Hcl) .... 1/2 To 1 Tab By Mouth Three Times A Day As Needed For Dizziness... 5)  Lexapro 10 Mg  Tabs (Escitalopram Oxalate) .... Take 1 Tablet By Mouth Once A Day 6)  Alprazolam 0.5 Mg  Tabs (Alprazolam) .... Take 1/2 To 1 Tablet By Mouth Three Times A Day As Needed For Nerves... (Not To Exceed 3 Per Day) 7)  Caltrate 600+d Plus 600-400 Mg-Unit Tabs (Calcium Carbonate-Vit D-Min) .... Take 1 Tab By Mouth Once Daily.Marland KitchenMarland Kitchen 8)  Womens Multivitamin Plus  Tabs (Multiple Vitamins-Minerals) .... Take One Tab Daily.Marland KitchenMarland Kitchen 9)  Vitamin D 1000 Unit Tabs (Cholecalciferol) .... Take 1 Tab Daily... 10)  Vicodin 5-500 Mg Tabs (Hydrocodone-Acetaminophen) .... Take 1 Tab Every 6 H As Needed For Pain... 11)  Shingles Vaccine ....  Administer Shingles Vaccine...  Allergies (verified): No Known Drug Allergies  Past History:  Past Medical History: Last updated: 02-Aug-2010 CONGENITAL NYSTAGMUS (ICD-379.51) ASTHMATIC BRONCHITIS, ACUTE (ICD-466.0) Hx of CHEST PAIN UNSPECIFIED (ICD-786.50)   Hosp 8/11 w/ CP- r/o for MI, & referred to Cards for further eval. VENOUS INSUFFICIENCY (ICD-459.81) HYPERCHOLESTEROLEMIA, BORDERLINE (ICD-272.4) UTI'S, HX OF (ICD-V13.00) DEGENERATIVE JOINT DISEASE (ICD-715.90) LOW BACK PAIN SYNDROME (ICD-724.2) FIBROMYALGIA (ICD-729.1) VITAMIN D DEFICIENCY (ICD-268.9) MEMORY LOSS (ICD-780.93) Hx of VERTIGO (ICD-780.4) ANXIETY (ICD-300.00)  Past Surgical History: Last updated: 08-02-2010 Cataract extraction Hysterectomy S/P Anterior Cervical Discectomy S/P Lumbar Laminectomy S/P right knee arthroscopy S/P right shoulder surgery 2008 by DrWainer s/p right shoulder replacement 05/22/09  Family History: Last updated: 08/02/2010 mother deceased age 50 from broken hip father deceased age 63 from kidney problems 1 sibling alive age 47 1 sibling alive age 48 1 sibling deceased age 59 1 sibling alive age 35  Social History: Last updated: Aug 02, 2010 quit smoking in 1993 exposed to second hand smoke exercise sometimes 3 cups caffeine daily widowed 3 children  Risk Factors: Smoking Status: quit (08-02-2010) Packs/Day: 2.0 (02-Aug-2010)     Family History: Reviewed history from 08/02/10 and no changes required. mother deceased age 13 from broken hip father deceased age 57 from kidney problems 1 sibling alive age 51 1 sibling alive age 44 1 sibling deceased age 6 1 sibling alive age 77  Social History: Reviewed history from Aug 02, 2010 and no changes required. quit smoking  in 1993 exposed to second hand smoke exercise sometimes 3 cups caffeine daily widowed 3 children  Vital Signs:  Patient profile:   77 year old female Height:      65 inches Weight:      160  pounds BMI:     26.72 Pulse rate:   76 / minute BP sitting:   140 / 80  (left arm) Cuff size:   regular  Vitals Entered By: Bishop Dublin, CMA (August 14, 2010 9:55 AM)  Physical Exam  General:  elderly. a bit frail. no acute distress. no resp difficulty HEENT: normal Neck: supple. no JVD. Carotids 2+ bilat; no bruits. No lymphadenopathy or thryomegaly appreciated. Cor: PMI nondisplaced. Regular rate & rhythm. No rubs, gallops. soft systolic murmur at RSB. s2 crisp Lungs: clear withprolonged exp phase Abdomen: soft, nontender, nondistended. No hepatosplenomegaly. No bruits or masses. Good bowel sounds. Extremities: no cyanosis, clubbing, rash, edema. + synovial thickening Neuro: alert & orientedx3, cranial nerves grossly intact. moves all 4 extremities w/o difficulty. affect pleasant    Impression & Recommendations:  Problem # 1:  CHEST TIGHTNESS-PRESSURE-OTHER (ICD-786.59) CP with mildly abnormal stress test. we reviewed the results of her nuclear test which suggested mild ischemia. We discussed the options of medical therapy vs cardiac cath. as she is currently asx and stress test is low-risk we have opted for medical therapy. Will continue ASA and start simva 20 (with goal LDL < 70). If she has further episodes of CP she knows to contact us immediately and we can reconsider cath as needed.   Patient Instructions: 1)  Your physician has recommended you make the following change in your medication: Simvastatin 20mg  one tablet at bedtime. 2)  Your physician wants you to follow-up in: 3 months.  You will receive a reminder letter in the mail two months in advance. If you don't receive a letter, please call our office to schedule the follow-up appointment. Prescriptions: SIMVASTATIN 20 MG TABS (SIMVASTATIN) Take one tablet by mouth daily at bedtime  #30 x 6   Entered by:   Benedict Needy, RN   Authorized by:   Dolores Patty, MD, [facility]   Signed by:   Benedict Needy, RN on  08/14/2010   Method used:   Electronically to        [facility]       [address]       [place], [place]  [address]       Ph: [phone]       Fax: [phone]   RxID:   [id number]

## 2010-11-26 NOTE — Letter (Signed)
Summary: [facility]  [facility]   Imported By: Sherian Rein 03/06/2010 10:20:02  _____________________________________________________________________  External Attachment:    Type:   Image     Comment:   External Document

## 2010-11-26 NOTE — Progress Notes (Signed)
Summary: HFU---appt with SN 07/18/2010  Phone Note Call from Patient   Caller: Daughter Call For: nadel Summary of Call: daughter states that pt is to have a HFU w/ sn. pt was d/c'd from hosp 3 wks ago. does dr Kriste Basque want to see her or should she see tp? sonji setzer [phone] Initial call taken by: Tivis Ringer, CNA,  July 16, 2010 12:57 PM  Follow-up for Phone Call        SN, do you want to work this pt in to see you for HFU, or do you want her sched with TP? Pls advise, thanks! Follow-up by: Vernie Murders,  July 16, 2010 2:02 PM  Additional Follow-up for Phone Call Additional follow up Details #1::        per TD, ok to add on Thursday 07/18/2010 at 10am.  Called and spoke with pt's daughter, Leander Rams, and informed her of appt date and time.  [name] verbalized understanding and will relay appt date and time to pt.  Aundra Millet Reynolds LPN  July 16, 2010 2:29 PM

## 2010-11-26 NOTE — Assessment & Plan Note (Signed)
Summary: 6 months/apc   CC:  6 month ROV & review of mult medical problems....  History of Present Illness: 77 y/o WF here for a follow up visit... she has multiple medical problems as noted below...     ~  saw DrDeveshwar in 4/09 w/ right shoulder bursitis and given shot- prev hx of right shoulder surg in 2008 by DrWainer... she has fallen x2 w/ ?repeat injury...  ~  saw ENT Sep09 w/ decr hearing- left cerumen impaction removed...  ~  had Mammogram Sep09 w/ ? left breast mass- w/u revealed 8mm cyst in left breast- f/u 33yr.   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 still has some right shoulder pain and they ?want second opinion at [facility]...  ~  Dec10:  she had right shoulder surg- arthroplasty by DrWeisler, Pollyann Savoy 7/10... doing better after PT etc... also followed regularly by DrDeveshwar for Pseudogout (CPPD), OA, DDD, etc.. on DCN100, Flexeril Prn, etc... DrD will be switching to another analgesic when the [facility] runs out... also takes Caltrate, MVI, Vit D 1000u/d and she notes marked clinical improvement on the Vit d supplement.   ~  April 10, 2010:  she's had some right knee pain w/ shot from [employer] & improved... saw DrDeveshwar 5/11 & stable on her Colchicine for CPPD, Vicodin & OTC NSAIDs Prn pain, & Flex for muscle spasm... 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    Current Problems:  CONGENITAL NYSTAGMUS (ICD-379.51)  ASTHMATIC BRONCHITIS, ACUTE (ICD-466.0) - she has stopped her prev Advair therapy and denies any prob w/ cough, sputum, dyspnea, etc... she states doing well- just wants a ZPak for Prn use.  ~  baseline CXR 8/08 showed clear lungs x scarring left base & DJD left shoulder...  ~  PFT's 11/05 showed FVC= 2.21 (81%), FEV1=1.35 (68%), %1sec=61, mid-flows=30%pred...  Hx of CHEST PAIN UNSPECIFIED (ICD-786.50) - no recurrence...  VENOUS  INSUFFICIENCY (ICD-459.81) - VI changes without swelling... prev tinea pedis on right foot betw 4th-5th toes has resolved w/ Lotrisone cream...  HYPERCHOLESTEROLEMIA, BORDERLINE (ICD-272.4) - on diet alone...  ~  FLP 2/10 showed TChol 179, TG 108, HDL 41, LDL 117... rec> better diet, incr exerc.  UTI'S, HX OF (ICD-V13.00)  DEGENERATIVE JOINT DISEASE (ICD-715.90) - she is followed by DrDeveshwar for Rheum w/ Pseudogout (CPPD) on COLCHICINE 0.6mg /d; Osteoarthritis w/ right shoulder arthro[facility] 7/10 at [facility]; & DDD... also followed by DrKramer who treated her left hand fracture after a fall last yr...  she takes VICODIN Prn, FLEXERIL Prn, & COLCHICINE 0.6mg /d...  LOW BACK PAIN SYNDROME (ICD-724.2) - she had prev Lumbar Lam... she uses VICODIN and FLEXERIL as needed... she states that she is stable without new complaints or concerns...  FIBROMYALGIA (ICD-729.1) - followed by DrDeveshwar and her notes are reviewed...  VITAMIN D DEFICIENCY (ICD-268.9) - on Vit D 1000 u OTC daily...  ~  labs 8/09 showed Vit D level = 27... rec> start OTC Vit D supplement.  ~  labs 9/10 showed Vit D level = 24... rec> take Vit D 1000 u daily.  MEMORY LOSS (ICD-780.93) - she notes poor memory and prev MRIBrain 11/08 showed old lacune in right thalamus, sm vessel dis, atrophy... she takes ASA 81mg /d...  Hx of VERTIGO (ICD-780.4) - uses Meclizine as needed...  ANXIETY (ICD-300.00) - she takes LEXAPRO 10mg /d and ALPRAZOLAM 0.5mg Tid Prn... both of which help  her mood and anxiety...   Preventive Screening-Counseling & Management  Alcohol-Tobacco     Smoking Status: quit     Year Quit: 1993  Allergies (verified): No Known Drug Allergies  Comments:  Nurse/Medical Assistant: The patient's medications and allergies were reviewed with the patient and were updated in the Medication and Allergy Lists.  Past History:  Past Medical History: CONGENITAL NYSTAGMUS (ICD-379.51) ASTHMATIC BRONCHITIS, ACUTE  (ICD-466.0) Hx of CHEST PAIN UNSPECIFIED (ICD-786.50) VENOUS INSUFFICIENCY (ICD-459.81) HYPERCHOLESTEROLEMIA, BORDERLINE (ICD-272.4) UTI'S, HX OF (ICD-V13.00) DEGENERATIVE JOINT DISEASE (ICD-715.90) LOW BACK PAIN SYNDROME (ICD-724.2) FIBROMYALGIA (ICD-729.1) VITAMIN D DEFICIENCY (ICD-268.9) MEMORY LOSS (ICD-780.93) Hx of VERTIGO (ICD-780.4) ANXIETY (ICD-300.00)  Past Surgical History: Cataract extraction Hysterectomy S/P Anterior Cervical Discectomy S/P Lumbar Laminectomy S/P right knee arthroscopy S/P right shoulder surgery 2008 by DrWainer s/p right shoulder replacement 05/22/09  Family History: Reviewed history from 06/12/2008 and no changes required. mother deceased age 43 from broken hip father deceased age 60 from kidney problems 1 sibling alive age 55 1 sibling alive age 20 1 sibling deceased age 70 1 sibling alive age 47  Social History: Reviewed history from 06/12/2008 and no changes required. quit smoking in 1993 exposed to second hand smoke exercise sometimes 3 cups caffeine daily widowed 3 children  Review of Systems      See HPI       The patient complains of dyspnea on exertion.  The patient denies anorexia, fever, weight loss, weight gain, vision loss, decreased hearing, hoarseness, chest pain, syncope, peripheral edema, prolonged cough, headaches, hemoptysis, abdominal pain, melena, hematochezia, severe indigestion/heartburn, hematuria, incontinence, muscle weakness, suspicious skin lesions, transient blindness, difficulty walking, depression, unusual weight change, abnormal bleeding, enlarged lymph nodes, and angioedema.    Vital Signs:  Patient profile:   77 year old female Height:      65 inches Weight:      164 pounds BMI:     27.39 O2 Sat:      95 % on Room air Temp:     99.3 degrees F oral Pulse rate:   65 / minute BP sitting:   136 / 64  (left arm) Cuff size:   regular  Vitals Entered By: Randell Loop CMA (April 10, 2010 9:51 AM)  O2 Sat  at Rest %:  95 O2 Flow:  Room air CC: 6 month ROV & review of mult medical problems... Is Patient Diabetic? No Pain Assessment Patient in pain? yes      Onset of pain  RIGHT KNEE PAIN AT TIMES Comments MEDS UPDATED TODAY   Physical Exam  Additional Exam:  WD, WN, 77 y/o WF in NAD... GENERAL:  Alert & oriented; pleasant & cooperative... HEENT:  [place]/AT, EOM-wnl, PERRLA w/ nystagmus, EACs-clear, TMs-wnl, NOSE-clear, THROAT-clear & wnl. NECK:  Supple w/ decr ROM; no JVD; normal carotid impulses w/o bruits; no thyromegaly or nodules palpated; no lymphadenopathy. CHEST:  Clear to P & A; without wheezes/ rales/ or rhonchi. HEART:  Regular Rhythm; without murmurs/ rubs/ or gallops. ABDOMEN:  Soft & nontender; normal bowel sounds; no organomegaly or masses detected. EXT: without deformities, mild arthritic changes; no varicose veins/ +venous insuffic/ tr edema. right shoulder well healed surgical scar & can raise right arm mid level... NEURO:  CN's intact; she has congenital nystagmus, no focal neuro deficits... DERM:  No lesions noted; no rash etc...    Impression & Recommendations:  Problem # 1:  ASTHMATIC BRONCHITIS, ACUTE (ICD-466.0) Stable w/o recent exac & denies any need for inhalers etc...  Problem #  2:  VENOUS INSUFFICIENCY (ICD-459.81) She has mild VI, VV but no incr swelling, no pain, etc...  Problem # 3:  HYPERCHOLESTEROLEMIA, BORDERLINE (ICD-272.4) She has mild hyperchol- on diet alone... we discussed f/u FLP on ret 70mo...  Problem # 4:  DEGENERATIVE JOINT DISEASE (ICD-715.90) Followed by drKramer for Ortho & DrDeveshwar for Rheum> on Colcicine for Pseudogout, Vicodin for pain/ back pain, Flexeril for FM & back pain... Her updated medication list for this problem includes:    Adult Aspirin Low Strength 81 Mg Tbdp (Aspirin) .Marland Kitchen... 1 tab daily...  Problem # 5:  ANXIETY (ICD-300.00) She is stable on the Lexapro 10mg  & Alprazolam Prn... Her updated medication list for  this problem includes:    Lexapro 10 Mg Tabs (Escitalopram oxalate) .Marland Kitchen... Take 1 tablet by mouth once a day    Alprazolam 0.5 Mg Tabs (Alprazolam) .Marland Kitchen... Take 1/2 to 1 tablet by mouth three times a day as needed for nerves... (not to exceed 3 per day)  Complete Medication List: 1)  Adult Aspirin Low Strength 81 Mg Tbdp (Aspirin) .Marland Kitchen.. 1 tab daily.Marland KitchenMarland Kitchen 2)  Flexeril 10 Mg Tabs (Cyclobenzaprine hcl) .... 1/2 to 1 tab by mouth three times a day as directed for muscle spasm... 3)  Colchicine 0.6 Mg Tabs (Colchicine) .... Take 1 tab by mouth once daily per drdeveshwar... 4)  Meclizine Hcl 25 Mg Tabs (Meclizine hcl) .... 1/2 to 1 tab by mouth three times a day as needed for dizziness... 5)  Lexapro 10 Mg Tabs (Escitalopram oxalate) .... Take 1 tablet by mouth once a day 6)  Alprazolam 0.5 Mg Tabs (Alprazolam) .... Take 1/2 to 1 tablet by mouth three times a day as needed for nerves... (not to exceed 3 per day) 7)  Caltrate 600+d Plus 600-400 Mg-unit Tabs (Calcium carbonate-vit d-min) .... Take 1 tab by mouth once daily.Marland KitchenMarland Kitchen 8)  Womens Multivitamin Plus Tabs (Multiple vitamins-minerals) .... Take one tab daily.Marland KitchenMarland Kitchen 9)  Vitamin D 1000 Unit Tabs (Cholecalciferol) .... Take 1 tab daily...  Patient Instructions: 1)  Today we updated your med list- see below.... 2)  Continue your current meds the same... 3)  Call for any problems.Marland KitchenMarland Kitchen 4)  Please schedule a follow-up appointment in 6 months, sooner as needed.   Immunization History:  Influenza Immunization History:    Influenza:  historical (09/12/2009)  Pneumovax Immunization History:    Pneumovax:  historical (01/10/2009)

## 2010-11-26 NOTE — Progress Notes (Signed)
Summary: lab notes/ fax request  Phone Note From Other Clinic   Caller: DR Titus Dubin Call For: NADEL Summary of Call: needs latest labs re: Elenore Rota. fax to [phone] attn: sheila Initial call taken by: Tivis Ringer, CNA,  January 16, 2010 3:01 PM  Follow-up for Phone Call        faxed labs/Juanita Follow-up by: Darletta Moll,  January 16, 2010 3:38 PM

## 2010-11-26 NOTE — Letter (Signed)
Summary: [facility]    [facility]    Imported By: Sherian Rein 01/16/2010 11:06:06  _____________________________________________________________________  External Attachment:    Type:   Image     Comment:   External Document

## 2010-11-26 NOTE — Progress Notes (Signed)
Summary: Nuclear pre procedure  Phone Note Outgoing Call Call back at [facility] Phone [phone]   Call placed by: Rea College, CMA,  July 24, 2010 4:48 PM Call placed to: Patient Summary of Call: Reviewed information on Myoview Information Sheet (see scanned document for further details).  Arline Asp spoke with patient.      Nuclear Med Background Indications for Stress Test: Evaluation for Ischemia, Post Hospital  Indications Comments: 06/12/10  chest pain, negative enzymes.  History: Echo  History Comments: 06/13/10 Echo:normal  Symptoms: Chest Pain, Nausea, Vomiting    Nuclear Pre-Procedure Cardiac Risk Factors: History of Smoking Height (in): 65

## 2010-11-26 NOTE — Assessment & Plan Note (Signed)
Summary: Cardiology Nuclear Testing  Nuclear Med Background Indications for Stress Test: Evaluation for Ischemia, Post Hospital  Indications Comments: 06/12/10  chest pain, negative enzymes.  History: Echo  History Comments: 06/13/10 Echo:normal  Symptoms: Chest Pressure, DOE, Fatigue, Nausea, Near Syncope, Palpitations, SOB, Vomiting  Symptoms Comments: Last episode of ZO:XWRU since d/c   Nuclear Pre-Procedure Cardiac Risk Factors: History of Smoking Caffeine/Decaff Intake: None NPO After: 8:30 PM Lungs: Clear.  O2 Sat 96% on RA. IV 0.9% NS with Angio Cath: 22g     IV Site: R Antecubital IV Started by: Bonnita Levan, RN Chest Size (in) 38     Cup Size C     Height (in): 65 Weight (lb): 159 BMI: 26.55  Nuclear Med Study 1 or 2 day study:  1 day     Stress Test Type:  Eugenie Birks Reading MD:  Olga Millers, MD     Referring MD:  Alroy Dust, MD Resting Radionuclide:  Technetium 57m Tetrofosmin     Resting Radionuclide Dose:  11 mCi  Stress Radionuclide:  Technetium 27m Tetrofosmin     Stress Radionuclide Dose:  33 mCi   Stress Protocol   Lexiscan: 0.4 mg   Stress Test Technologist:  Rea College, CMA-N     Nuclear Technologist:  Domenic Polite, CNMT  Rest Procedure  Myocardial perfusion imaging was performed at rest 45 minutes following the intravenous administration of Technetium 10m Tetrofosmin.  Stress Procedure  The patient received IV Lexiscan 0.4 mg over 15-seconds.  Technetium 61m Tetrofosmin injected at 30-seconds.  There were nonspecific T-wave changes with infusion.  Quantitative spect images were obtained after a 45 minute delay.  QPS Raw Data Images:  Acquisition technically good; normal left ventricular size. Stress Images:  There is decreased uptake in the inferior wall and apex. Rest Images:  There is decreased uptake in the inferior wall. Subtraction (SDS):  These findings are consistent with inferior thinning and mild ischemia in the inferior  wall/apex. Transient Ischemic Dilatation:  1.01  (Normal <1.22)  Lung/Heart Ratio:  .29  (Normal <0.45)  Quantitative Gated Spect Images QGS EDV:  70 ml QGS ESV:  18 ml QGS EF:  74 % QGS cine images:  Normal wall motion.   Overall Impression  Exercise Capacity: Lexiscan with no exercise. BP Response: Normal blood pressure response. Clinical Symptoms: No chest pain ECG Impression: No significant ST segment change suggestive of ischemia. Overall Impression: Abnormal lexiscan nuclear study with inferior thinning and mild ischemia in the inferior/apical wall.  Appended Document: Cardiology Nuclear Testing Myoview showed mild inferior ischemia & we will need her to see Cardiology for further eval...  SN  Appended Document: Orders Update    Clinical Lists Changes  Orders: Added new Referral order of Cardiology Referral (Cardiology) - Signed     called and spoke with pts daughter and she is aware that order to see cardiologist has been sent  and that someone will call her with this appt. Randell Loop CMA  July 29, 2010 12:50 PM

## 2010-11-28 NOTE — Progress Notes (Signed)
Summary: prescriptions-LMTCBx1 - Waiting for daughter tcb with update  Phone Note Call from Patient   Caller: Daughter/[name] Call For: Dr. Kriste Basque Summary of Call: Patients daughter Leander Rams phoned she would like for a nurse to call her. Her mother still has the congestion that Dr. Kriste Basque treated her for and last night she had stomach pains all night she had nausea but only spit up clear phlegm. She still has a deep coug and congestion in her lungs. She wants to know if something can be called in to [facility] at [facility]. [name] can be reached at [phone] Initial call taken by: Vedia Coffer,  November 01, 2010 9:59 AM  Follow-up for Phone Call        [id number]. Carron Curie CMA  November 01, 2010 11:00 AM  Pt's daughter reports that pt moaned all night with stomach pains last night.  Denies nausea or vomiting.  Also still has lots of chest congestion and deep cough.  Mucus is clear.  Denies SOB, wheezing or fever.  Daughter gave pt pain pill this am and she has been sleeping since.  She is going to check on pt and will call us back and let us know how she is .  Offered her appt with TP this afternoon.  She did not want to accept appt until she had checked on pt again. Abigail Miyamoto RN  November 01, 2010 11:20 AM    Additional Follow-up for Phone Call Additional follow up Details #1::        Pt's stomach is better now but still would like another round of Augmentin for chest congestion and cough. Please advise. Abigail Miyamoto RN  November 01, 2010 1:15 PM     Additional Follow-up for Phone Call Additional follow up Details #2::    per SN---no fever and the phlegm is clear---augmentin will not help this----what is the augmentin treating???  pt needs to use mucinex 2 two times a day with plenty of fluids and tussionex #4oz   1 tsp two times a day for cough and pred dosepak 5 mg   6 day pack take as directed.  thanks Randell Loop CMA  November 01, 2010 3:41 PM   Additional Follow-up for Phone  Call Additional follow up Details #3:: Details for Additional Follow-up Action Taken: Spoke with pt's daughter and notified of the above recs per SN. She verbalized understanding and rxs were called to pharm. Additional Follow-up by: Vernie Murders,  November 01, 2010 3:47 PM

## 2010-11-28 NOTE — Assessment & Plan Note (Signed)
Summary: F3M/AMD   Visit Type:  Follow-up Primary Provider:  Lorin Picket Nadel,M.D.  CC:  "doing well" denies chest pain and SOB and palpitations..  History of Present Illness: Jessica [name] is an 77 y/o woman with h/o HL, fibromyalgia, anxiety and COPD.  Went to ER with CP in 8/11. Had post hospital Myoview in 9/11 with normal EF and question of inferior ischemia. We discussed this last visit and given that she was asymptomatic we decided to pursue medical therapy.  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 Does get fatigued. No palpitations, CHF or syncope.   Wondering about f/u on lung nodule seen on previous CT.   Current Medications (verified): 1)  Meclizine Hcl 25 Mg  Tabs (Meclizine Hcl) .... 1/2 To 1 Tab By Mouth Three Times A Day As Needed For Dizziness... 2)  Adult Aspirin Low Strength 81 Mg  Tbdp (Aspirin) .Marland Kitchen.. 1 Tab Daily.Marland KitchenMarland Kitchen 3)  Simvastatin 20 Mg Tabs (Simvastatin) .... Take One Tablet By Mouth Daily At Bedtime 4)  Vicodin 5-500 Mg Tabs (Hydrocodone-Acetaminophen) .... Take 1 Tab Every 6 H As Needed For Pain... 5)  Colchicine 0.6 Mg Tabs (Colchicine) .... Take 1 Tab By Mouth Once Daily Per Drdeveshwar... 6)  Flexeril 10 Mg Tabs (Cyclobenzaprine Hcl) .... 1/2 To 1 Tab By Mouth Three Times A Day As Directed For Muscle Spasm... 7)  Caltrate 600+d Plus 600-400 Mg-Unit Tabs (Calcium Carbonate-Vit D-Min) .... Take 1 Tab By Mouth Once Daily.Marland KitchenMarland Kitchen 8)  Womens Multivitamin Plus  Tabs (Multiple Vitamins-Minerals) .... Take One Tab Daily.Marland KitchenMarland Kitchen 9)  Vitamin D 1000 Unit Tabs (Cholecalciferol) .... Take 1 Tab Daily... 10)  Lexapro 10 Mg  Tabs (Escitalopram Oxalate) .... Take 1 Tablet By Mouth Once A Day 11)  Alprazolam 0.5 Mg  Tabs (Alprazolam) .... Take 1/2 To 1 Tablet By Mouth Three Times A Day As Needed For Nerves... (Not To Exceed 3 Per Day) 12)  Shingles Vaccine .... Administer Shingles Vaccine... 13)  Augmentin 875-125 Mg Tabs (Amoxicillin-Pot  Clavulanate) .Marland Kitchen.. 1 By Mouth Two Times A Day Until Gone 14)  Prednisone (Pak) 5 Mg Tabs (Prednisone) .... Take As Directed---Give 6 Day Pack  Allergies (verified): No Known Drug Allergies  Past History:  Past Medical History: Last updated: 10/07/2010 CONGENITAL NYSTAGMUS (ICD-379.51) ASTHMATIC BRONCHITIS, ACUTE (ICD-466.0) Hx of CHEST PAIN UNSPECIFIED (ICD-786.50)   Hosp 8/11 w/ CP- r/o for MI, & referred to Cards for further eval. VENOUS INSUFFICIENCY (ICD-459.81) HYPERCHOLESTEROLEMIA, BORDERLINE (ICD-272.4) UTI'S, HX OF (ICD-V13.00) DEGENERATIVE JOINT DISEASE (ICD-715.90) LOW BACK PAIN SYNDROME (ICD-724.2) FIBROMYALGIA (ICD-729.1) VITAMIN D DEFICIENCY (ICD-268.9) MEMORY LOSS (ICD-780.93) Hx of VERTIGO (ICD-780.4) ANXIETY (ICD-300.00)  Past Surgical History: Last updated: 10/07/2010 Cataract extraction Hysterectomy S/P Anterior Cervical Discectomy S/P Lumbar Laminectomy S/P right knee arthroscopy S/P right shoulder surgery 2008 by DrWainer s/p right shoulder replacement 05/22/09  Family History: Last updated: 08/16/10 mother deceased age 32 from broken hip father deceased age 38 from kidney problems 1 sibling alive age 78 1 sibling alive age 19 1 sibling deceased age 31 1 sibling alive age 43  Social History: Last updated: 08/16/10 quit smoking in 1993 exposed to second hand smoke exercise sometimes 3 cups caffeine daily widowed 3 children  Risk Factors: Smoking Status: quit (10/07/2010) Packs/Day: 2.0 (10/07/2010)  Review of Systems       As per HPI and past medical history; otherwise all systems negative.   Vital Signs:  Patient profile:   77 year old female Height:  65 inches Weight:      161.25 pounds BMI:     26.93 Pulse rate:   61 / minute BP sitting:   124 / 68  (left arm) Cuff size:   regular  Vitals Entered By: Lysbeth Galas CMA (November 19, 2010 10:12 AM)  Physical Exam  General:  elderly. a bit frail. no acute distress. no  resp difficulty HEENT: normal ? mild R facial droop Neck: supple. no JVD. Carotids 2+ bilat; R bruits. No lymphadenopathy or thryomegaly appreciated. Cor: PMI nondisplaced. Regular rate & rhythm. No rubs, gallops. soft systolic murmur at RSB. s2 crisp Lungs: clear withprolonged exp phase Abdomen: soft, nontender, nondistended. No hepatosplenomegaly. No bruits or masses. Good bowel sounds. Extremities: no cyanosis, clubbing, rash, edema. + synovial thickening Neuro: alert & orientedx3, cranial nerves grossly intact. moves all 4 extremities w/o difficulty. affect pleasant    Impression & Recommendations:  Problem # 1:  CAD, NATIVE VESSEL (ICD-414.01) Presumed CAD by stress test. (low-risk). Remains asymptomatic. Continue ASA 81 & simvastatin 20 with goal LDL < 70.    Problem # 2:  Pulmonary nodule Will need f/u with Dr. Kriste Basque.   Other Orders: EKG w/ Interpretation (93000)

## 2010-11-28 NOTE — Assessment & Plan Note (Signed)
Summary: 6 months/apc   Primary Care Dynesha Woolen:  Lorin Picket Nadel,M.D.  CC:  3 month ROV & review....  History of Present Illness: 77 y/o WF here for a follow up visit... she has multiple medical problems as noted below...     ~  April 10, 2010:  she's had some right knee pain w/ shot from [employer] & improved... saw DrDeveshwar 5/11 & stable on her Colchicine for CPPD, Vicodin & OTC NSAIDs Prn pain, & Flex for muscle spasm... 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   ~  July 18, 2010:  she was hosp 8/17-19/11 by [facility] for CP- severe SSCP w/ N&V, NTG helped in ER & EKG w/ poor R progression/ NAD... pain was somewhat atyp & it hurt to breathe (neg CTAngio), but no apparent CWP/ tender... Enz were neg etc- and we discussed poss further cardiac eval w/ Myoview & she would like to proceed w/ this testing...  Risk Factors: +- FamHx, ex-smoker, LDL ~117 on diet alone, neg HBP/ DM... no recurrent CP since disch- she has Vicodin for Prn use.   ~  October 07, 2010:  Myoview showed ?inferior ischemia, norm EF; she saw DrBensimhon for cards & offered med rx vs cath- chose med rx w/ ASA, Simva20, call for problems... she is c/o left knee pain & thinks it's "gout" & she has appt w/ DrKramer later today... she had cough, yellow sput, congestion & some SOB- Augmentin called in for her & we will add Depo/ Dosepak, Mucinex, Fluids... needs f/u FLP when able.   Current Problems:  CONGENITAL NYSTAGMUS (ICD-379.51)  ASTHMATIC BRONCHITIS, ACUTE (ICD-466.0) - she has stopped her prev Advair therapy and denies any prob w/ cough, sputum, dyspnea, etc... she states doing well- just wants a ZPak for Prn use.  ~  she is an ex-smoker having smoked from age 65 to 24 up to 2ppd, "but I just puffed, didn't inhale"  ~  baseline CXR 8/08 showed clear lungs x scarring left base & DJD left shoulder...  ~  PFT's 11/05 showed FVC= 2.21 (81%), FEV1=1.35  (68%), %1sec=61, mid-flows=30%pred...  ~  CT Angio Chest 8/11 showed 5mm nodule RLL w/ fu CT in 72mo rec...  ~  12/11:  she had acute exac Rx w/ Augmentin, Depo/ Dosepak, Mucinex, etc...  Hx of CHEST PAIN  (ICD-786.50) - ** see 8/17-19/11 hosp ** EKG, Enz, 2DEcho, CTA... subseq outpt Myoview showed ?ischemia, norm EF; eval by DrBensimhon w/ option for med rx vs cath & she chose med rx w/ ASA, Simva20...  VENOUS INSUFFICIENCY (ICD-459.81) - VI changes without swelling... prev tinea pedis on right foot betw 4th-5th toes has resolved w/ Lotrisone cream...  HYPERCHOLESTEROLEMIA, BORDERLINE (ICD-272.4) - on diet alone...  ~  FLP 2/10 showed TChol 179, TG 108, HDL 41, LDL 117... rec> better diet, incr exerc.  ~  10/11:  started on Simva20 & she is due for f/u FLP- asked to ret fasting for this lab.  UTI'S, HX OF (ICD-V13.00) - CT Angio chest 8/11 in hosp also showed ? hydronephrosis vs parapelvic cysts> subseq CT Abd confirmed cysts...  DEGENERATIVE JOINT DISEASE (ICD-715.90) - she is followed by DrDeveshwar for Rheum w/ Pseudogout (CPPD) on COLCHICINE 0.6mg /d; Osteoarthritis w/ right shoulder arthroplasty 7/10 at [facility]; & DDD... also followed by DrKramer who treated her left hand fracture after a fall last yr...  she takes VICODIN Prn, FLEXERIL Prn, & COLCHICINE 0.6mg /d...  ~  12/11:  c/o right knee pain which she thinks is "gout" & she will f/u w/ DrKramer.  LOW BACK PAIN SYNDROME (ICD-724.2) - she had prev Lumbar Lam... she uses VICODIN and FLEXERIL as needed... she states that she is stable without new complaints or concerns...  FIBROMYALGIA (ICD-729.1) - followed by DrDeveshwar and her notes are reviewed...  VITAMIN D DEFICIENCY (ICD-268.9) - on Vit D 1000 u OTC daily...  ~  labs 8/09 showed Vit D level = 27... rec> start OTC Vit D supplement.  ~  labs 9/10 showed Vit D level = 24... rec> take Vit D 1000 u daily.  MEMORY LOSS (ICD-780.93) - she notes poor memory and prev MRIBrain 11/08 showed  old lacune in right thalamus, sm vessel dis, atrophy... she takes ASA 81mg /d...  Hx of VERTIGO (ICD-780.4) - uses Meclizine as needed...  ANXIETY (ICD-300.00) - she takes LEXAPRO 10mg /d and ALPRAZOLAM 0.5mg Tid Prn... both of which help her mood and anxiety...   Preventive Screening-Counseling & Management  Alcohol-Tobacco     Smoking Status: quit     Packs/Day: 2.0     Year Quit: 1993  Allergies (verified): No Known Drug Allergies  Comments:  Nurse/Medical Assistant: The patient's medications and allergies were reviewed with the patient and were updated in the Medication and Allergy Lists.  Past History:  Past Medical History: CONGENITAL NYSTAGMUS (ICD-379.51) ASTHMATIC BRONCHITIS, ACUTE (ICD-466.0) Hx of CHEST PAIN UNSPECIFIED (ICD-786.50)   Hosp 8/11 w/ CP- r/o for MI, & referred to Cards for further eval. VENOUS INSUFFICIENCY (ICD-459.81) HYPERCHOLESTEROLEMIA, BORDERLINE (ICD-272.4) UTI'S, HX OF (ICD-V13.00) DEGENERATIVE JOINT DISEASE (ICD-715.90) LOW BACK PAIN SYNDROME (ICD-724.2) FIBROMYALGIA (ICD-729.1) VITAMIN D DEFICIENCY (ICD-268.9) MEMORY LOSS (ICD-780.93) Hx of VERTIGO (ICD-780.4) ANXIETY (ICD-300.00)  Past Surgical History: Cataract extraction Hysterectomy S/P Anterior Cervical Discectomy S/P Lumbar Laminectomy S/P right knee arthroscopy S/P right shoulder surgery 2008 by DrWainer s/p right shoulder replacement 05/22/09  Family History: Reviewed history from 07/18/2010 and no changes required. mother deceased age 73 from broken hip father deceased age 71 from kidney problems 1 sibling alive age 77 1 sibling alive age 45 1 sibling deceased age 35 1 sibling alive age 73  Social History: Reviewed history from 07/18/2010 and no changes required. quit smoking in 1993 exposed to second hand smoke exercise sometimes 3 cups caffeine daily widowed 3 children  Review of Systems      See HPI       The patient complains of decreased hearing,  dyspnea on exertion, and muscle weakness.  The patient denies anorexia, fever, weight loss, weight gain, vision loss, hoarseness, chest pain, syncope, peripheral edema, prolonged cough, headaches, hemoptysis, abdominal pain, melena, hematochezia, severe indigestion/heartburn, hematuria, incontinence, suspicious skin lesions, transient blindness, difficulty walking, depression, unusual weight change, abnormal bleeding, enlarged lymph nodes, and angioedema.    Vital Signs:  Patient profile:   77 year old female Height:      65 inches O2 Sat:      94 % on Room air Temp:     98.0 degrees F oral Pulse rate:   88 / minute BP sitting:   124 / 62  (left arm) Cuff size:   regular  Vitals Entered By: Randell Loop CMA (October 07, 2010 11:14 AM)  O2 Sat at Rest %:  94 O2 Flow:  Room air CC: 3 month ROV & review... Is Patient Diabetic? No Pain Assessment Patient in pain? yes      Onset of pain  pain in right knee that started on friday---unable to bend the  knee now Comments no changes in meds today   Physical Exam  Additional Exam:  WD, WN, 77 y/o WF in NAD... GENERAL:  Alert & oriented; pleasant & cooperative... HEENT:  [place]/AT, EOM-wnl, PERRLA w/ nystagmus, EACs-clear, TMs-wnl, NOSE-clear, THROAT-clear & wnl. NECK:  Supple w/ decr ROM; no JVD; normal carotid impulses w/o bruits; no thyromegaly or nodules palpated; no lymphadenopathy. CHEST:  Clear to P & A; without wheezes/ rales/ or rhonchi. HEART:  Regular Rhythm; without murmurs/ rubs/ or gallops. ABDOMEN:  Soft & nontender; normal bowel sounds; no organomegaly or masses detected. EXT: without deformities, mild arthritic changes; no varicose veins/ +venous insuffic/ tr edema. right shoulder well healed surgical scar & can raise right arm mid level... NEURO:  CN's intact; she has congenital nystagmus, no focal neuro deficits... DERM:  No lesions noted; no rash etc...    MISC. Report  Procedure date:  10/07/2010  Findings:       DATA REVIEWED:  ~  Myoview 07/25/10...  ~  Cards eval DrBensimhon 08/14/10...   Impression & Recommendations:  Problem # 1:  ASTHMATIC BRONCHITIS, ACUTE (ICD-466.0) Recent exac Rx w/ Augmentin, Depo/ Dosepak, Mucinex, etc... Her updated medication list for this problem includes:    Augmentin 875-125 Mg Tabs (Amoxicillin-pot clavulanate) .Marland Kitchen... 1 by mouth two times a day until gone  Orders: Depo- Medrol 80mg  (J1040)  Problem # 2:  Hx of CHEST PAIN UNSPECIFIED (ICD-786.50) S/p cards eval DrBensimhon & she opted for med rx w/ ASA, Simva20, & observation... doing satis no angina etc.  Problem # 3:  HYPERCHOLESTEROLEMIA, BORDERLINE (ICD-272.4) On Simva20 now & needs f/u FLP- pending. Her updated medication list for this problem includes:    Simvastatin 20 Mg Tabs (Simvastatin) .Marland Kitchen... Take one tablet by mouth daily at bedtime  Problem # 4:  DEGENERATIVE JOINT DISEASE (ICD-715.90) Followed by DrKramer for Ortho & DrDeveshwar for Rheum... Her updated medication list for this problem includes:    Adult Aspirin Low Strength 81 Mg Tbdp (Aspirin) .Marland Kitchen... 1 tab daily...    Vicodin 5-500 Mg Tabs (Hydrocodone-acetaminophen) .Marland Kitchen... Take 1 tab every 6 h as needed for pain...  Problem # 5:  VITAMIN D DEFICIENCY (ICD-268.9) Continue Vit D supplement OTC...  Problem # 6:  ANXIETY (ICD-300.00) Stable on meds>  continue same. Her updated medication list for this problem includes:    Lexapro 10 Mg Tabs (Escitalopram oxalate) .Marland Kitchen... Take 1 tablet by mouth once a day    Alprazolam 0.5 Mg Tabs (Alprazolam) .Marland Kitchen... Take 1/2 to 1 tablet by mouth three times a day as needed for nerves... (not to exceed 3 per day)  Complete Medication List: 1)  Meclizine Hcl 25 Mg Tabs (Meclizine hcl) .... 1/2 to 1 tab by mouth three times a day as needed for dizziness... 2)  Adult Aspirin Low Strength 81 Mg Tbdp (Aspirin) .Marland Kitchen.. 1 tab daily.Marland KitchenMarland Kitchen 3)  Simvastatin 20 Mg Tabs (Simvastatin) .... Take one tablet by mouth daily at  bedtime 4)  Vicodin 5-500 Mg Tabs (Hydrocodone-acetaminophen) .... Take 1 tab every 6 h as needed for pain.Marland KitchenMarland Kitchen 5)  Colchicine 0.6 Mg Tabs (Colchicine) .... Take 1 tab by mouth once daily per drdeveshwar... 6)  Flexeril 10 Mg Tabs (Cyclobenzaprine hcl) .... 1/2 to 1 tab by mouth three times a day as directed for muscle spasm... 7)  Caltrate 600+d Plus 600-400 Mg-unit Tabs (Calcium carbonate-vit d-min) .... Take 1 tab by mouth once daily.Marland KitchenMarland Kitchen 8)  Womens Multivitamin Plus Tabs (Multiple vitamins-minerals) .... Take one tab daily.Marland KitchenMarland Kitchen 9)  Vitamin  D 1000 Unit Tabs (Cholecalciferol) .... Take 1 tab daily... 10)  Lexapro 10 Mg Tabs (Escitalopram oxalate) .... Take 1 tablet by mouth once a day 11)  Alprazolam 0.5 Mg Tabs (Alprazolam) .... Take 1/2 to 1 tablet by mouth three times a day as needed for nerves... (not to exceed 3 per day) 12)  Shingles Vaccine  .... Administer shingles vaccine... 13)  Augmentin 875-125 Mg Tabs (Amoxicillin-pot clavulanate) .Marland Kitchen.. 1 by mouth two times a day until gone 14)  Prednisone (pak) 5 Mg Tabs (Prednisone) .... Take as directed---give 6 day pack  Other Orders: Admin of Therapeutic Inj  intramuscular or subcutaneous (16109)  Patient Instructions: 1)  Today we updated your med list- see below.... 2)  For your Asthmatic Bronchitis:  we gave you a DepoMedrol shot & wrote a new perscription for a Prednisone dosepak to take over the next 6days... finish the Augmentin, and continue the Mucinex.Marland KitchenMarland Kitchen 3)  Continue your current meds including the Colchicine per DrDeveshwar (for your pseudogout).Marland KitchenMarland Kitchen 4)  Hopefully DrKramer will be able to draw some fluid off your right knee, check for gout crystals, & instill some cortisone to help this joint today... 5)  Call for any questions.Marland KitchenMarland Kitchen 6)  Let's plan a follow up visit in 3months w/ FASTING labs at that time... Prescriptions: PREDNISONE (PAK) 5 MG TABS (PREDNISONE) take as directed---give 6 day pack  #1 pack x 0   Entered by:   Randell Loop  CMA   Authorized by:   Michele Mcalpine MD   Signed by:   Randell Loop CMA on 10/09/2010   Method used:   Electronically to        [facility] (retail)       [address]       [place], [place]  [address]       Ph: [phone]       Fax: [phone]   RxID:   [id number]     Medication Administration  Injection # 1:    Medication: Depo- Medrol 80mg     Diagnosis: ASTHMATIC BRONCHITIS, ACUTE (ICD-466.0)    Route: IM    Site: RUOQ gluteus    Exp Date: 02/2013    Lot #: [id number]    Mfr: Pharm[employer]    Patient tolerated injection without complications    Given by: Randell Loop CMA (October 07, 2010 12:36 PM)  Orders Added: 1)  Est. Patient Level IV [44010] 2)  Depo- Medrol 80mg  [J1040] 3)  Admin of Therapeutic Inj  intramuscular or subcutaneous [27253]

## 2010-12-05 ENCOUNTER — Telehealth: Payer: Self-pay | Admitting: Pulmonary Disease

## 2010-12-12 NOTE — Progress Notes (Signed)
Summary: Alprazolam refill request  Phone Note Refill Request Message from:  Fax from Pharmacy on December 05, 2010 11:26 AM  Refills Requested: Medication #1:  ALPRAZOLAM 0.5 MG  TABS take 1/2 to 1 tablet by mouth three times a day as needed for nerves... (not to exceed 3 per day)   Dosage confirmed as above?Dosage Confirmed   Brand Name Necessary? No   Supply Requested: 1 month   Last Refilled: 10/28/2010   Notes: Dr. Kriste Basque patient [facility] [address]  (p) [phone]   Method Requested: Telephone to Pharmacy Next Appointment Scheduled: 01/06/2011 w/ SN Initial call taken by: Michel Bickers CMA,  December 05, 2010 11:28 AM  Follow-up for Phone Call        Pls advise if okay to refill. Michel Bickers CMA  December 05, 2010 11:29 AM    Prescriptions: ALPRAZOLAM 0.5 MG  TABS (ALPRAZOLAM) take 1/2 to 1 tablet by mouth three times a day as needed for nerves... (not to exceed 3 per day)  #100 x 5   Entered by:   Randell Loop CMA   Authorized by:   Michele Mcalpine MD   Signed by:   Randell Loop CMA on 12/06/2010   Method used:   Telephoned to ...       [facility]       [address]       [place], [place]  [address]       Ph: [phone]       Fax: [phone]   RxID:   [id number] ALPRAZOLAM 0.5 MG  TABS (ALPRAZOLAM) take 1/2 to 1 tablet by mouth three times a day as needed for nerves... (not to exceed 3 per day)  #100 x 5   Entered by:   Randell Loop CMA   Authorized by:   Michele Mcalpine MD   Signed by:   Randell Loop CMA on 12/05/2010   Method used:   Telephoned to ...       [facility] (retail)       [address]       [place], [place]  [address]       Ph: [phone]       Fax: [phone]   RxID:   [id number]

## 2010-12-24 ENCOUNTER — Encounter: Payer: Self-pay | Admitting: Adult Health

## 2010-12-24 ENCOUNTER — Ambulatory Visit (INDEPENDENT_AMBULATORY_CARE_PROVIDER_SITE_OTHER)
Admission: RE | Admit: 2010-12-24 | Discharge: 2010-12-24 | Disposition: A | Discharge status: Home / Self Care | Payer: PRIVATE HEALTH INSURANCE | Source: Ambulatory Visit | Attending: Pulmonary Disease | Admitting: Pulmonary Disease

## 2010-12-24 ENCOUNTER — Ambulatory Visit (INDEPENDENT_AMBULATORY_CARE_PROVIDER_SITE_OTHER): Payer: PRIVATE HEALTH INSURANCE | Admitting: Adult Health

## 2010-12-24 ENCOUNTER — Other Ambulatory Visit: Payer: Self-pay | Admitting: Pulmonary Disease

## 2010-12-24 DIAGNOSIS — J209 Acute bronchitis, unspecified: Secondary | ICD-10-CM

## 2011-01-02 NOTE — Assessment & Plan Note (Signed)
Summary: Acute NP office visit - bronchitis   Primary Provider/Referring Provider:  Lorin Picket Nadel,M.D.  CC:  prod cough with green/yellow mucus, wheezing, and increased SOB x48months - states is no better since 09/2010 ov w/ SN w/ pred dose pak and augmentin.  History of Present Illness: 77 yo WF with known history of DJD, Hyperlipidemia.   December 24, 2010 --Presents for an acute office visit. Complains of prod cough with green/yellow mucus, wheezing, increased SOB x59months - states is no better since 09/2010 ov w/ SN w/ pred dose pak and augmentin. Pt is with daughter today and says over last 2 months she has had a progressive cough with congestion for last 2 months. Worse for last week. She is coughing up green mucus. Does not have rx coverage. Denies chest pain,  , orthopnea, hemoptysis, fever, n/v/d, edema, headache.  NO weight loss , gerd or dysphagia      Medications Prior to Update: 1)  Meclizine Hcl 25 Mg  Tabs (Meclizine Hcl) .... 1/2 To 1 Tab By Mouth Three Times A Day As Needed For Dizziness... 2)  Adult Aspirin Low Strength 81 Mg  Tbdp (Aspirin) .Marland Kitchen.. 1 Tab Daily.Marland KitchenMarland Kitchen 3)  Simvastatin 20 Mg Tabs (Simvastatin) .... Take One Tablet By Mouth Daily At Bedtime 4)  Vicodin 5-500 Mg Tabs (Hydrocodone-Acetaminophen) .... Take 1 Tab Every 6 H As Needed For Pain... 5)  Colchicine 0.6 Mg Tabs (Colchicine) .... Take 1 Tab By Mouth Once Daily Per Drdeveshwar... 6)  Flexeril 10 Mg Tabs (Cyclobenzaprine Hcl) .... 1/2 To 1 Tab By Mouth Three Times A Day As Directed For Muscle Spasm... 7)  Caltrate 600+d Plus 600-400 Mg-Unit Tabs (Calcium Carbonate-Vit D-Min) .... Take 1 Tab By Mouth Once Daily.Marland KitchenMarland Kitchen 8)  Womens Multivitamin Plus  Tabs (Multiple Vitamins-Minerals) .... Take One Tab Daily.Marland KitchenMarland Kitchen 9)  Vitamin D 1000 Unit Tabs (Cholecalciferol) .... Take 1 Tab Daily... 10)  Lexapro 10 Mg  Tabs (Escitalopram Oxalate) .... Take 1 Tablet By Mouth Once A Day 11)  Alprazolam 0.5 Mg  Tabs (Alprazolam) .... Take 1/2 To  1 Tablet By Mouth Three Times A Day As Needed For Nerves... (Not To Exceed 3 Per Day) 12)  Shingles Vaccine .... Administer Shingles Vaccine... 13)  Augmentin 875-125 Mg Tabs (Amoxicillin-Pot Clavulanate) .Marland Kitchen.. 1 By Mouth Two Times A Day Until Gone 14)  Prednisone (Pak) 5 Mg Tabs (Prednisone) .... Take As Directed---Give 6 Day Pack  Current Medications (verified): 1)  Meclizine Hcl 25 Mg  Tabs (Meclizine Hcl) .... 1/2 To 1 Tab By Mouth Three Times A Day As Needed For Dizziness... 2)  Adult Aspirin Low Strength 81 Mg  Tbdp (Aspirin) .Marland Kitchen.. 1 Tab Daily.Marland KitchenMarland Kitchen 3)  Simvastatin 20 Mg Tabs (Simvastatin) .... Take One Tablet By Mouth Daily At Bedtime 4)  Vicodin 5-500 Mg Tabs (Hydrocodone-Acetaminophen) .... Take 1 Tab Every 6 H As Needed For Pain... 5)  Colchicine 0.6 Mg Tabs (Colchicine) .... Take 1 Tab By Mouth Once Daily Per Drdeveshwar... 6)  Flexeril 10 Mg Tabs (Cyclobenzaprine Hcl) .... 1/2 To 1 Tab By Mouth Three Times A Day As Directed For Muscle Spasm... 7)  Caltrate 600+d Plus 600-400 Mg-Unit Tabs (Calcium Carbonate-Vit D-Min) .... Take 1 Tab By Mouth Once Daily.Marland KitchenMarland Kitchen 8)  Womens Multivitamin Plus  Tabs (Multiple Vitamins-Minerals) .... Take One Tab Daily.Marland KitchenMarland Kitchen 9)  Vitamin D 1000 Unit Tabs (Cholecalciferol) .... Take 1 Tab Daily... 10)  Lexapro 10 Mg  Tabs (Escitalopram Oxalate) .... Take 1 Tablet By Mouth Once A Day 11)  Alprazolam 0.5  Mg  Tabs (Alprazolam) .... Take 1/2 To 1 Tablet By Mouth Three Times A Day As Needed For Nerves... (Not To Exceed 3 Per Day)  Allergies (verified): No Known Drug Allergies  Past History:  Past Medical History: Last updated: 10/07/2010 CONGENITAL NYSTAGMUS (ICD-379.51) ASTHMATIC BRONCHITIS, ACUTE (ICD-466.0) Hx of CHEST PAIN UNSPECIFIED (ICD-786.50)   Hosp 8/11 w/ CP- r/o for MI, & referred to Cards for further eval. VENOUS INSUFFICIENCY (ICD-459.81) HYPERCHOLESTEROLEMIA, BORDERLINE (ICD-272.4) UTI'S, HX OF (ICD-V13.00) DEGENERATIVE JOINT DISEASE  (ICD-715.90) LOW BACK PAIN SYNDROME (ICD-724.2) FIBROMYALGIA (ICD-729.1) VITAMIN D DEFICIENCY (ICD-268.9) MEMORY LOSS (ICD-780.93) Hx of VERTIGO (ICD-780.4) ANXIETY (ICD-300.00)  Past Surgical History: Last updated: 10/07/2010 Cataract extraction Hysterectomy S/P Anterior Cervical Discectomy S/P Lumbar Laminectomy S/P right knee arthroscopy S/P right shoulder surgery 2008 by DrWainer s/p right shoulder replacement 05/22/09  Family History: Last updated: 08-17-10 mother deceased age 63 from broken hip father deceased age 20 from kidney problems 1 sibling alive age 53 1 sibling alive age 39 1 sibling deceased age 36 1 sibling alive age 15  Social History: Last updated: 08-17-2010 quit smoking in 1993 exposed to second hand smoke exercise sometimes 3 cups caffeine daily widowed 3 children  Risk Factors: Smoking Status: quit (10/07/2010) Packs/Day: 2.0 (10/07/2010)  Review of Systems      See HPI  Vital Signs:  Patient profile:   77 year old female Height:      65 inches Weight:      155.38 pounds BMI:     25.95 O2 Sat:      97 % on Room air Temp:     97.4 degrees F oral Pulse rate:   88 / minute BP sitting:   136 / 64  (left arm) Cuff size:   regular  Vitals Entered By: Boone Master CNA/MA (December 24, 2010 4:53 PM)  O2 Flow:  Room air CC: prod cough with green/yellow mucus, wheezing, increased SOB x64months - states is no better since 09/2010 ov w/ SN w/ pred dose pak and augmentin Is Patient Diabetic? No Comments Medications reviewed with patient Daytime contact number verified with patient. Boone Master CNA/MA  December 24, 2010 4:54 PM    Physical Exam  Additional Exam:  WD, WN, 77  y/o WF in NAD... GENERAL:  Alert & oriented; pleasant & cooperative. HEENT:  [place]/AT,   EACs-clear, TMs-wnl, NOSE-clear, THROAT-clear & wnl. NECK:  Supple w/ decr ROM; no JVD; normal carotid impulses w/o bruits; no thyromegaly or nodules palpated; no  lymphadenopathy. CHEST:  Coarse BS w/ scattered rhonchi  HEART:  Regular Rhythm; without murmurs/ rubs/ or gallops. ABDOMEN:  Soft & nontender; normal bowel sounds; no organomegaly or masses detected. EXT: without deformities, mild arthritic changes; no varicose veins/ +venous insuffic/ tr edema.     Impression & Recommendations:  Problem # 1:  ASTHMATIC BRONCHITIS, ACUTE (ICD-466.0) Recurrent exacerbation  xopenex neb in office  cxr pending Plan:   Doxycyline 100mg  two times a day for 10 days   Mucinex DM two times a day as needed cough/congestion  Fluids and rest Prednisone taper over next week Tussionex as needed cough  Please contact office for sooner follow up if symptoms do not improve or worsen  The following medications were removed from the medication list:    Augmentin 875-125 Mg Tabs (Amoxicillin-pot clavulanate) .Marland Kitchen... 1 by mouth two times a day until gone Her updated medication list for this problem includes:    Doxycycline Hyclate 100 Mg Caps (Doxycycline hyclate) .Marland Kitchen... 1 by mouth two times  a day  Orders: T-2 View CXR (71020TC) Nebulizer Tx (91478) Albuterol Sulfate Sol 1mg  unit dose (G9562) Est. Patient Level IV (13086)  Medications Added to Medication List This Visit: 1)  Doxycycline Hyclate 100 Mg Caps (Doxycycline hyclate) .Marland Kitchen.. 1 by mouth two times a day 2)  Prednisone 10 Mg Tabs (Prednisone) .... 4 tabs for 3 days, then 3 tabs for 3 days, 2 tabs for 3 days, then 1 tab for 3 days, then stop  Patient Instructions: 1)  Doxycyline 100mg  two times a day for 10 days   2)  Mucinex DM two times a day as needed cough/congestion  3)  Fluids and rest 4)  Prednisone taper over next week 5)  Tussionex as needed cough  6)  Please contact office for sooner follow up if symptoms do not improve or worsen  Prescriptions: PREDNISONE 10 MG TABS (PREDNISONE) 4 tabs for 3 days, then 3 tabs for 3 days, 2 tabs for 3 days, then 1 tab for 3 days, then stop  #30 x 0   Entered and  Authorized by:   Rubye Oaks NP   Signed by:   Rubye Oaks NP on 12/24/2010   Method used:   Electronically to        [facility] ([facility]ail)       [address]       [place], [place]  [address]       Ph: [phone]       Fax: [phone]   RxID:   [id number] DOXYCYCLINE HYCLATE 100 MG CAPS (DOXYCYCLINE HYCLATE) 1 by mouth two times a day  #20 x 0   Entered and Authorized by:   Rubye Oaks NP   Signed by:   Tyrann Donaho NP on 12/24/2010   Method used:   Electronically to        [facility] (retail)       [address]       [place], [place]  [address]       Ph: [phone]       Fax: [phone]   RxID:   [id number]    Medication Administration  Medication # 1:    Medication: Albuterol Sulfate Sol 1mg  unit dose    Diagnosis: ASTHMATIC BRONCHITIS, ACUTE (ICD-466.0)    Dose: 1 vial    Route: inhaled    Exp Date: 10-2011    Lot #: [id number]    Mfr: nephron    Patient tolerated medication without complications    Given by: Boone Master CNA/MA (December 24, 2010 5:41 PM)  Orders Added: 1)  T-2 View CXR [71020TC] 2)  Nebulizer Tx [70623] 3)  Albuterol Sulfate Sol 1mg  unit dose [J7613] 4)  Est. Patient Level IV [76283]

## 2011-01-06 ENCOUNTER — Encounter: Payer: Self-pay | Admitting: Pulmonary Disease

## 2011-01-06 ENCOUNTER — Other Ambulatory Visit: Payer: Self-pay | Admitting: Pulmonary Disease

## 2011-01-06 ENCOUNTER — Ambulatory Visit (INDEPENDENT_AMBULATORY_CARE_PROVIDER_SITE_OTHER): Payer: Medicare Other | Admitting: Pulmonary Disease

## 2011-01-06 ENCOUNTER — Other Ambulatory Visit: Payer: Medicare Other

## 2011-01-06 DIAGNOSIS — J984 Other disorders of lung: Secondary | ICD-10-CM | POA: Insufficient documentation

## 2011-01-06 DIAGNOSIS — F411 Generalized anxiety disorder: Secondary | ICD-10-CM

## 2011-01-06 DIAGNOSIS — M1990 Unspecified osteoarthritis, unspecified site: Secondary | ICD-10-CM

## 2011-01-06 DIAGNOSIS — IMO0001 Reserved for inherently not codable concepts without codable children: Secondary | ICD-10-CM

## 2011-01-06 DIAGNOSIS — M545 Low back pain: Secondary | ICD-10-CM

## 2011-01-06 DIAGNOSIS — J209 Acute bronchitis, unspecified: Secondary | ICD-10-CM

## 2011-01-06 DIAGNOSIS — E785 Hyperlipidemia, unspecified: Secondary | ICD-10-CM

## 2011-01-06 DIAGNOSIS — E559 Vitamin D deficiency, unspecified: Secondary | ICD-10-CM

## 2011-01-06 DIAGNOSIS — I2510 Atherosclerotic heart disease of native coronary artery without angina pectoris: Secondary | ICD-10-CM

## 2011-01-06 DIAGNOSIS — R079 Chest pain, unspecified: Secondary | ICD-10-CM

## 2011-01-06 DIAGNOSIS — I872 Venous insufficiency (chronic) (peripheral): Secondary | ICD-10-CM

## 2011-01-06 LAB — BASIC METABOLIC PANEL
BUN: 13 mg/dL (ref 6–23)
CO2: 27 mEq/L (ref 19–32)
Chloride: 102 mEq/L (ref 96–112)
Creatinine, Ser: 0.7 mg/dL (ref 0.4–1.2)
Glucose, Bld: 75 mg/dL (ref 70–99)
Sodium: 139 mEq/L (ref 135–145)

## 2011-01-06 LAB — HEPATIC FUNCTION PANEL
Albumin: 4 g/dL (ref 3.5–5.2)
Alkaline Phosphatase: 93 U/L (ref 39–117)
Total Bilirubin: 0.6 mg/dL (ref 0.3–1.2)

## 2011-01-06 LAB — TSH: TSH: 1.33 u[IU]/mL (ref 0.35–5.50)

## 2011-01-06 LAB — LIPID PANEL
HDL: 43 mg/dL (ref >39.00)
Total CHOL/HDL Ratio: 4

## 2011-01-06 LAB — CBC WITH DIFFERENTIAL/PLATELET
Basophils Absolute: 0 10*3/uL (ref 0.0–0.1)
Basophils Relative: 0.6 % (ref 0.0–3.0)
Eosinophils Relative: 3.6 % (ref 0.0–5.0)
Lymphs Abs: 1.4 10*3/uL (ref 0.7–4.0)
MCHC: 34.1 g/dL (ref 30.0–36.0)
MCV: 93.9 fl (ref 78.0–100.0)
Neutrophils Relative %: 65.5 % (ref 43.0–77.0)
RBC: 3.94 Mil/uL (ref 3.87–5.11)

## 2011-01-09 LAB — HEPATIC FUNCTION PANEL
Alkaline Phosphatase: 90 U/L (ref 39–117)
Bilirubin, Direct: 0.1 mg/dL (ref 0.0–0.3)
Indirect Bilirubin: 0.5 mg/dL (ref 0.3–0.9)
Total Bilirubin: 0.6 mg/dL (ref 0.3–1.2)

## 2011-01-09 LAB — CBC
HCT: 39.2 % (ref 36.0–46.0)
HCT: 42.7 % (ref 36.0–46.0)
Hemoglobin: 12.6 g/dL (ref 12.0–15.0)
Hemoglobin: 14.4 g/dL (ref 12.0–15.0)
MCH: 30.8 pg (ref 26.0–34.0)
MCHC: 32.1 g/dL (ref 30.0–36.0)
MCV: 95.8 fL (ref 78.0–100.0)
MCV: 96 fL (ref 78.0–100.0)
RBC: 4.45 MIL/uL (ref 3.87–5.11)
WBC: 9.4 10*3/uL (ref 4.0–10.5)

## 2011-01-09 LAB — COMPREHENSIVE METABOLIC PANEL
ALT: 15 U/L (ref 0–35)
CO2: 27 mEq/L (ref 19–32)
Calcium: 8.5 mg/dL (ref 8.4–10.5)
GFR calc non Af Amer: >60 mL/min (ref >60)
Glucose, Bld: 95 mg/dL (ref 70–99)
Sodium: 142 mEq/L (ref 135–145)

## 2011-01-09 LAB — DIFFERENTIAL
Basophils Absolute: 0 10*3/uL (ref 0.0–0.1)
Eosinophils Relative: 2 % (ref 0–5)
Lymphocytes Relative: 18 % (ref 12–46)
Lymphs Abs: 1.7 10*3/uL (ref 0.7–4.0)
Monocytes Absolute: 0.6 10*3/uL (ref 0.1–1.0)
Monocytes Relative: 6 % (ref 3–12)
Neutro Abs: 6.9 10*3/uL (ref 1.7–7.7)

## 2011-01-09 LAB — BASIC METABOLIC PANEL
Chloride: 106 mEq/L (ref 96–112)
GFR calc non Af Amer: >60 mL/min (ref >60)
Potassium: 3.7 mEq/L (ref 3.5–5.1)
Sodium: 141 mEq/L (ref 135–145)

## 2011-01-09 LAB — MAGNESIUM: Magnesium: 1.9 mg/dL (ref 1.5–2.5)

## 2011-01-09 LAB — CARDIAC PANEL(CRET KIN+CKTOT+MB+TROPI)
CK, MB: 1.5 ng/mL (ref 0.3–4.0)
Relative Index: INVALID (ref 0.0–2.5)
Troponin I: 0.02 ng/mL (ref 0.00–0.06)

## 2011-01-09 LAB — POCT CARDIAC MARKERS
CKMB, poc: 1.5 ng/mL (ref 1.0–8.0)
Myoglobin, poc: 68 ng/mL (ref 12–200)

## 2011-01-09 LAB — LIPASE, BLOOD: Lipase: 22 U/L (ref 11–59)

## 2011-01-14 NOTE — Assessment & Plan Note (Signed)
Summary: 3 month rov   Primary Care Provider:  Lorin Picket Orvill Coulthard,M.D.  CC:  3 month ROV & review of mult medical problems....  History of Present Illness: 77 y/o WF here for a follow up visit... she has multiple medical problems as noted below...      ~  July 18, 2010:  she was hosp 8/17-19/11 by [facility] for CP- severe SSCP w/ N&V, NTG helped in ER & EKG w/ poor R progression/ NAD... pain was somewhat atyp & it hurt to breathe (neg CTAngio), but no apparent CWP/ tender... Enz were neg etc- and we discussed poss further cardiac eval w/ Myoview & she would like to proceed w/ this testing...  Risk Factors: +- FamHx, ex-smoker, LDL ~117 on diet alone, neg HBP/ DM... no recurrent CP since disch- she has Vicodin for Prn use.   ~  October 07, 2010:  Myoview showed ?inferior ischemia, norm EF; she saw DrBensimhon for cards & offered med rx vs cath- chose med rx w/ ASA, Simva20, call for problems... she is c/o left knee pain & thinks it's "gout" & she has appt w/ DrKramer later today... she had cough, yellow sput, congestion & some SOB- Augmentin called in for her & we will add Depo/ Dosepak, Mucinex, Fluids... needs f/u FLP when able.   ~  January 06, 2011:  35mo ROV & stable> had bronchitis 2/12 treated by TP w/ Doxy, Mucinex, Pred & improved;  CXR was clear, basilar scarring, no nodule seen, atherosclerotic changes in Ao, right humeral prosthesis;  she has hx AB & had CT Angio chest 8/11 from ER when she presented w/ CP (neg for PE & incidental 5mm RLL nodule seen)> due for f/u 6+mo CT to check RLL nodule & compare;  ex-smoker quit in 1993...    Hx CP> prev eval DrBensimhon & she opted for med Rx rather than more aggressive eval;  seen 1/12 and stable- no CP, palpit, SOB, etc; continue conserv approach & risk factor reduction strategy...    DJD, Psuedogout, LBP, FM, etc>  followed by DrKramer for Ortho & shot in knee helped for awile but she says she needs TKR;  due for f/u DrDeveshwar later this month;  continue  same meds...    Anxiety>  she remains under stress but the Lexapro/ Alpraz really helps she says...    FASTING Labs>  all look good;  FLP on Simva20 showed TChol 183, TG 110, HDL 43, LDL 118...      Current Problems:  CONGENITAL NYSTAGMUS (ICD-379.51)  ASTHMATIC BRONCHITIS, ACUTE (ICD-466.0) - she has stopped her prev Advair therapy and denies any prob w/ cough, sputum, dyspnea, etc... she states doing well- just wants a ZPak for Prn use.  ~  she is an ex-smoker having smoked from age 45 to 53 up to 2ppd, "but I just puffed, didn't inhale"  ~  baseline CXR 8/08 showed clear lungs x scarring left base & DJD left shoulder...  ~  PFT's 11/05 showed FVC= 2.21 (81%), FEV1=1.35 (68%), %1sec=61, mid-flows=30%pred...  ~  CT Angio Chest 8/11 showed 5mm nodule RLL w/ fu CT in 57mo rec...  ~  12/11:  she had acute exac Rx w/ Augmentin, Depo/ Dosepak, Mucinex, etc...  Hx of CHEST PAIN  (ICD-786.50) - ** see 8/17-19/11 hosp ** EKG, Enz, 2DEcho, CTA... subseq outpt Myoview showed ?ischemia, norm EF; eval by DrBensimhon w/ option for med rx vs cath & she chose med rx w/ ASA, Simva20...  VENOUS INSUFFICIENCY (ICD-459.81) - VI changes  without swelling... prev tinea pedis on right foot betw 4th-5th toes has resolved w/ Lotrisone cream...  HYPERCHOLESTEROLEMIA, BORDERLINE (ICD-272.4) - on SIMVASTATIN 20mg /d + diet rx now...  ~  FLP 2/10 showed TChol 179, TG 108, HDL 41, LDL 117... rec> better diet, incr exerc.  ~  10/11:  started on Simva20 & she is due for f/u FLP- asked to ret fasting for this lab.  UTI'S, HX OF (ICD-V13.00) - CT Angio chest 8/11 in hosp also showed ? hydronephrosis vs parapelvic cysts> subseq CT Abd confirmed cysts...  DEGENERATIVE JOINT DISEASE (ICD-715.90) - she is followed by DrDeveshwar for Rheum w/ Pseudogout (CPPD) on COLCHICINE 0.6mg /d; Osteoarthritis w/ right shoulder arthroplasty 7/10 at [facility]; & DDD... also followed by DrKramer who treated her left hand fracture after a fall last yr  & has injected her knees...  she takes VICODIN Prn, FLEXERIL Prn, & COLCHICINE 0.6mg /d...  ~  12/11:  c/o right knee pain which she thinks is "gout" & she will f/u w/ DrKramer.  LOW BACK PAIN SYNDROME (ICD-724.2) - she had prev Lumbar Lam... she uses VICODIN and FLEXERIL as needed... she states that she is stable without new complaints or concerns...  FIBROMYALGIA (ICD-729.1) - followed by DrDeveshwar and her notes are reviewed...  VITAMIN D DEFICIENCY (ICD-268.9) - on Vit D 1000 u OTC daily...  ~  labs 8/09 showed Vit D level = 27... rec> start OTC Vit D supplement.  ~  labs 9/10 showed Vit D level = 24... rec> take Vit D 1000 u daily.  ~  labs 3/12 showed Vit D level = 41... continue 1000 u daily.  MEMORY LOSS (ICD-780.93) - she notes poor memory and prev MRIBrain 11/08 showed old lacune in right thalamus, sm vessel dis, atrophy... she takes ASA 81mg /d...  Hx of VERTIGO (ICD-780.4) - uses Meclizine as needed...  ANXIETY (ICD-300.00) - she takes LEXAPRO 10mg /d and ALPRAZOLAM 0.5mg Tid Prn (both of which help her mood and anxiety)...  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   Preventive Screening-Counseling & Management  Alcohol-Tobacco     Smoking Status: quit     Packs/Day: 2.0     Year Quit: 1993  Allergies (verified): No Known Drug Allergies  Comments:  Nurse/Medical Assistant: The patient's medications and allergies were reviewed with the patient and were updated in the Medication and Allergy Lists.  Past History:  Past Medical History: CONGENITAL NYSTAGMUS (ICD-379.51) ASTHMATIC BRONCHITIS, ACUTE (ICD-466.0) Hx of CHEST PAIN UNSPECIFIED (ICD-786.50)   Hosp 8/11 w/ CP- r/o for MI, & referred to Cards for further eval. VENOUS INSUFFICIENCY (ICD-459.81) HYPERCHOLESTEROLEMIA, BORDERLINE (ICD-272.4) UTI'S, HX OF (ICD-V13.00) DEGENERATIVE JOINT DISEASE (ICD-715.90) LOW BACK PAIN SYNDROME (ICD-724.2) FIBROMYALGIA  (ICD-729.1) VITAMIN D DEFICIENCY (ICD-268.9) MEMORY LOSS (ICD-780.93) Hx of VERTIGO (ICD-780.4) ANXIETY (ICD-300.00)  Past Surgical History: Cataract extraction Hysterectomy S/P Anterior Cervical Discectomy S/P Lumbar Laminectomy S/P right knee arthroscopy S/P right shoulder surgery 2008 by DrWainer s/p right shoulder replacement 05/22/09  Family History: Reviewed history from 07/18/2010 and no changes required. mother deceased age 4 from broken hip father deceased age 33 from kidney problems 1 sibling alive age 83 1 sibling alive age 89 1 sibling deceased age 71 1 sibling alive age 16  Social History: Reviewed history from 07/18/2010 and no changes required. quit smoking in 1993 exposed to second hand smoke exercise sometimes 3 cups caffeine daily widowed 3 children  Review of Systems      See HPI  The patient complains of dyspnea on exertion and difficulty walking.  The patient denies anorexia, fever, weight loss, weight gain, vision loss, decreased hearing, hoarseness, chest pain, syncope, peripheral edema, prolonged cough, headaches, hemoptysis, abdominal pain, melena, hematochezia, severe indigestion/heartburn, hematuria, incontinence, muscle weakness, suspicious skin lesions, transient blindness, depression, unusual weight change, abnormal bleeding, enlarged lymph nodes, and angioedema.    Vital Signs:  Patient profile:   77 year old female Height:      65 inches Weight:      155.13 pounds BMI:     25.91 O2 Sat:      94 % on Room air Temp:     97.7 degrees F oral Pulse rate:   83 / minute BP sitting:   132 / 70  (left arm) Cuff size:   regular  Vitals Entered By: Randell Loop CMA (January 06, 2011 11:34 AM)  O2 Sat at Rest %:  94 O2 Flow:  Room air CC: 3 month ROV & review of mult medical problems... Is Patient Diabetic? No Pain Assessment Patient in pain? no      Comments meds updated today with pt and daughter   Physical Exam  Additional  Exam:  WD, WN, 77 y/o WF in NAD... GENERAL:  Alert & oriented; pleasant & cooperative... HEENT:  [place]/AT, EOM-wnl, PERRLA w/ nystagmus, EACs-clear, TMs-wnl, NOSE-clear, THROAT-clear & wnl. NECK:  Supple w/ decr ROM; no JVD; normal carotid impulses w/o bruits; no thyromegaly or nodules palpated; no lymphadenopathy. CHEST:  Clear to P & A; without wheezes/ rales/ or rhonchi. HEART:  Regular Rhythm; without murmurs/ rubs/ or gallops. ABDOMEN:  Soft & nontender; normal bowel sounds; no organomegaly or masses detected. EXT: without deformities, mild arthritic changes; no varicose veins/ +venous insuffic/ tr edema. right shoulder well healed surgical scar & can raise right arm mid level... NEURO:  CN's intact; she has congenital nystagmus, no focal neuro deficits... DERM:  No lesions noted; no rash etc...    Impression & Recommendations:  Problem # 1:  ASTHMATIC BRONCHITIS, ACUTE (ICD-466.0) Improved after last Rx 2/12... she did not want to continue Advair/ Symbicort/ etc... The following medications were removed from the medication list:    Doxycycline Hyclate 100 Mg Caps (Doxycycline hyclate) .Marland Kitchen... 1 by mouth two times a day  Problem # 2:  PULMONARY NODULE (ICD-518.89) Due for f/u CT Chest to check status of the 5mm RLL nodule seen on CTA 8/11 (incidental finding)... Recent CXR 2/12 w/o nodule seen, she has basilar scarring etc... Orders: Radiology Referral (Radiology) >> CT Chest ==> pending.  Problem # 3:  CAD, NATIVE VESSEL (ICD-414.01) ?CAD> followed by DrBensimhon on risk factor reduction strategy as she declined cath etc... Her updated medication list for this problem includes:    Adult Aspirin Low Strength 81 Mg Tbdp (Aspirin) .Marland Kitchen... 1 tab daily...  Orders: TLB-BMP (Basic Metabolic Panel-BMET) (80048-METABOL) TLB-Hepatic/Liver Function Pnl (80076-HEPATIC) TLB-CBC Platelet - w/Differential (85025-CBCD) TLB-Lipid Panel (80061-LIPID) TLB-TSH (Thyroid Stimulating Hormone)  (84443-TSH) T-Vitamin D (25-Hydroxy) ([address]-60454)  Problem # 4:  HYPERCHOLESTEROLEMIA, BORDERLINE (ICD-272.4) On Simva20 & LDL 118> goal is <100, <70... discussed diet & exercise before going up on med... Her updated medication list for this problem includes:    Simvastatin 20 Mg Tabs (Simvastatin) .Marland Kitchen... Take one tablet by mouth daily at bedtime  Problem # 5:  DEGENERATIVE JOINT DISEASE (ICD-715.90) Followed by DrKramer & she says she was told she will need a TKR... Her updated medication list for this problem includes:    Adult Aspirin Low  Strength 81 Mg Tbdp (Aspirin) .Marland Kitchen... 1 tab daily...    Vicodin 5-500 Mg Tabs (Hydrocodone-acetaminophen) .Marland Kitchen... Take 1 tab every 6 h as needed for pain...  Problem # 6:  FIBROMYALGIA (ICD-729.1) Followed by DrDeveshwar for Rheum w/ CPPD & FM... continue Rx +colchicine... Her updated medication list for this problem includes:    Adult Aspirin Low Strength 81 Mg Tbdp (Aspirin) .Marland Kitchen... 1 tab daily...    Vicodin 5-500 Mg Tabs (Hydrocodone-acetaminophen) .Marland Kitchen... Take 1 tab every 6 h as needed for pain...    Flexeril 10 Mg Tabs (Cyclobenzaprine hcl) .Marland Kitchen... 1/2 to 1 tab by mouth three times a day as directed for muscle spasm...  Problem # 7:  ANXIETY (ICD-300.00) Remains under stress, meds help & wants to continue the same... Her updated medication list for this problem includes:    Lexapro 10 Mg Tabs (Escitalopram oxalate) .Marland Kitchen... Take 1 tablet by mouth once a day    Alprazolam 0.5 Mg Tabs (Alprazolam) .Marland Kitchen... Take 1/2 to 1 tablet by mouth three times a day as needed for nerves... (not to exceed 3 per day)  Complete Medication List: 1)  Adult Aspirin Low Strength 81 Mg Tbdp (Aspirin) .Marland Kitchen.. 1 tab daily.Marland KitchenMarland Kitchen 2)  Simvastatin 20 Mg Tabs (Simvastatin) .... Take one tablet by mouth daily at bedtime 3)  Vicodin 5-500 Mg Tabs (Hydrocodone-acetaminophen) .... Take 1 tab every 6 h as needed for pain.Marland KitchenMarland Kitchen 4)  Colchicine 0.6 Mg Tabs (Colchicine) .... Take 1 tab by mouth once daily  per drdeveshwar... 5)  Flexeril 10 Mg Tabs (Cyclobenzaprine hcl) .... 1/2 to 1 tab by mouth three times a day as directed for muscle spasm... 6)  Caltrate 600+d Plus 600-400 Mg-unit Tabs (Calcium carbonate-vit d-min) .... Take 1 tab by mouth once daily.Marland KitchenMarland Kitchen 7)  Womens Multivitamin Plus Tabs (Multiple vitamins-minerals) .... Take one tab daily.Marland KitchenMarland Kitchen 8)  Vitamin D 1000 Unit Tabs (Cholecalciferol) .... Take 1 tab daily.Marland KitchenMarland Kitchen 9)  Lexapro 10 Mg Tabs (Escitalopram oxalate) .... Take 1 tablet by mouth once a day 10)  Alprazolam 0.5 Mg Tabs (Alprazolam) .... Take 1/2 to 1 tablet by mouth three times a day as needed for nerves... (not to exceed 3 per day) 11)  Meclizine Hcl 25 Mg Tabs (Meclizine hcl) .... 1/2 to 1 tab by mouth three times a day as needed for dizziness...  Patient Instructions: 1)  Today we updated your med list- see below.... 2)  Continue your current meds the same... 3)  We will call in a refill of your alprazolam to the [facility] on [address] as requested... 4)  Today we did your follow up FASTING blood work...  5)  We will sched a follow up CT Scan of your Chest to re-evaluate the tiny RLL nodule seen in Aug2011... 6)  We will call you w/ the results when avail.Marland KitchenMarland Kitchen 7)  Call for any problems... 8)  Please schedule a follow-up appointment in 6 months. Prescriptions: ALPRAZOLAM 0.5 MG  TABS (ALPRAZOLAM) take 1/2 to 1 tablet by mouth three times a day as needed for nerves... (not to exceed 3 per day)  #100 x 5   Entered by:   Randell Loop CMA   Authorized by:   Michele Mcalpine MD   Signed by:   Randell Loop CMA on 01/06/2011   Method used:   Telephoned to ...       [facility] [address] [address] (retail)       [address]       [place]  [place], [place]  [address]       Ph: [phone]       Fax: [phone]   RxID:   [id number]

## 2011-01-15 ENCOUNTER — Ambulatory Visit (INDEPENDENT_AMBULATORY_CARE_PROVIDER_SITE_OTHER)
Admission: RE | Admit: 2011-01-15 | Discharge: 2011-01-15 | Disposition: A | Discharge status: Home / Self Care | Payer: Medicare Other | Source: Ambulatory Visit | Attending: Pulmonary Disease | Admitting: Pulmonary Disease

## 2011-01-15 DIAGNOSIS — J984 Other disorders of lung: Secondary | ICD-10-CM

## 2011-01-15 MED ORDER — IOHEXOL 300 MG/ML  SOLN
100.0000 mL | Freq: Once | INTRAMUSCULAR | Status: AC | PRN
  Administered 2011-01-15: 100 mL via INTRAVENOUS

## 2011-01-16 ENCOUNTER — Telehealth: Payer: Self-pay | Admitting: *Deleted

## 2011-01-16 MED ORDER — MECLIZINE HCL 25 MG PO TABS: 25.0000 mg | ORAL_TABLET | Freq: Three times a day (TID) | ORAL | Status: DC | PRN

## 2011-01-16 NOTE — Telephone Encounter (Signed)
Called and spoke with pt about her lab results and ct scan per SN----ct scan shows no growth in the rll nodule, it is less dense and this is all good news.  We will repeat ct scan in 1 year and pt voiced her understanding of this.  Labs all looked good--cont the same meds for now.  Pt is aware that copy has been mailed to Dr. Corliss Skains for their records.  Pt requesting that meclizine be refilled for her and ok per SN to send in refills for her.

## 2011-03-14 NOTE — Assessment & Plan Note (Signed)
[facility]                             PULMONARY OFFICE NOTE   NAME:Jessica [name], Jessica [name]                     MRN:          [id number]  DATE:02/16/2007                            DOB:          13-Oct-1926    HISTORY OF PRESENT ILLNESS:  The patient is a 77 year old white female  patient of Dr. Kriste Basque with a known history of asthmatic bronchitis,  fibromyalgia, and anxiety who presents for an acute office visit.  The  patient was seen two weeks ago for an acute asthmatic bronchitic flare  and was given Avelox and a Medrol Dosepak.  The patient reports that her  cough and congestion are much better, however at that office visit the  patient had complained of some weakness and dizziness which have not  improved.  The patient continues to complain of feeling quite light-  headed when she stands up, turns positions, or rolls over in bed.  The  patient denies any associated palpitations, chest pain, slurred speech,  difficulty swallowing, or visual changes.   PAST MEDICAL HISTORY:  Reviewed.   CURRENT MEDICATIONS:  Reviewed.   PHYSICAL EXAMINATION:  The patient is an elderly, frail female in no  acute distress.  She is afebrile.  Blood pressure 160/86.  O2 saturation  98% on room air.  HEENT:  PERRLA.  EOMI without nystagmus.  EACs have some slight cerumen  noted.  TMs are normal.  NECK:  Supple without cervical adenopathy.  No JVD.  Facial features are  symmetrical.  LUNGS:  Clear without any crackles or rhonchi, or wheezes.  CARDIAC:  S1, S2 without murmur, rub, or gallop.  EXTREMITIES:  Warm without any calf cyanosis, clubbing, or edema.  NEURO:  The patient is alert and oriented to person and place.  Moves  all extremities well.  Cranial nerves II through XII are intact.  The  patient does have an unsteady gait and has reproducible symptoms with  head maneuvers.  Negative for nystagmus.   DATA:  Labs on February 05, 2007, showed a white cell count of  13,200,  hemoglobin 13.6.  Sed rate 32.  Electrolyte panel normal.  Thyroid level  normal.   IMPRESSION AND PLAN:  Suspect benign positional vertigo.  The patient is  to use meclizine 25 mg, 1/2 to 1 tablet up to 3 times a day as needed.  Increase fluid intake.  The patient is  advised if the symptoms do not improve or worsen, she is to contact our  office for sooner followup.      Rubye Oaks, NP  Electronically Signed      Lonzo Cloud. Kriste Basque, MD  Electronically Signed   TP/MedQ  DD: 02/16/2007  DT: 02/16/2007  Job #: [id number]

## 2011-03-14 NOTE — Op Note (Signed)
NAME:  Jessica [name], Jessica [name]                        ACCOUNT NO.:  [id number]   MEDICAL RECORD NO.:  [id number]                   PATIENT TYPE:  INP   LOCATION:  3025                                 FACILITY:  [facility]   PHYSICIAN:  Payton Doughty, M.D.                   DATE OF BIRTH:  23-Mar-1926   DATE OF PROCEDURE:  06/21/2002  DATE OF DISCHARGE:                                 OPERATIVE REPORT   PREOPERATIVE DIAGNOSIS:  Herniated disc at C3-4.   POSTOPERATIVE DIAGNOSIS:  Herniated disc at C3-4.   OPERATION:  C3-4 anterior cervical infusion with a tethered plate.   SURGEON:  Payton Doughty, M.D.   ANESTHESIA:  General endotracheal   PREPARATION:  Sterile drape and prepped and scrubbed with alcohol wipe.   COMPLICATIONS:  None   ASSISTANT:  [place]   INDICATIONS FOR PROCEDURE:  This is a 77 year old right handed white lady  with a herniated cervical disc at C3-4.   DESCRIPTION OF PROCEDURE:  She was taken to the operating room,  anesthetized, intubated and placed supine on the operating table in the  Holter head traction.  Shaved, prepped and draped in the usual sterile  fashion.  Skin was incised in the midline,  the medial more  sternocleidomastoid muscle approximately 3 fingerbreadths above the level of  carotid tubercle and 2 fingerbreadths below the angle of the mandible.  The  platysma was identified and elevated, divided and undermined.  The  sternocleidomastoid was identified and dissection revealed the carotid  artery, it was retracted laterally to the left.  The trachea and esophagus  were retracted laterally to the right exposing the bones of the anterior  cervical spine.  Marker was placed.  Intraoperative x-ray obtained to  confirm correctness of level.  Having confirmed the correctness of level,  dissection was carried out under close observation.  We then brought in the  operating microscope and microdissection technique was used by [name] to dissect  the anterior  epidural space, decompress the nerve roots and remove the large  herniated disc which was compressing the central part of the spinal cord.  Following complete removal of the disc and exploration of the nerve roots,  the wound was irrigated and hemostasis achieved.  A 7 mm bone graft was  fashioned with patella Allograft and tapped into place.  A 14 mm tethered  plate was then placed with 12 mm screws.  Intraoperative x-ray showed good  placement of bone graft, plate and screws.  The wound was irrigated.  Hemostasis was assured.  The platysma was reapproximated with 3-0 Vicryl in  an interrupted fashion.  Subcutaneous tissue was reapproximated with 3-0  Vicryl in interrupted fashion and skin was closed with 4-0 Vicryl in running  subcuticular fashion.  Benzoin and Steri-Strips were placed made occlusive  with Telfa and Op-Site.  The patient returned to the recovery room in good  condition.                                              Payton Doughty, M.D.   MWR/MEDQ  D:  06/21/2002  T:  06/23/2002  Job:  [id number]

## 2011-03-14 NOTE — Op Note (Signed)
NAMEGWENDLYN, Jessica [name]              ACCOUNT NO.:  [id number]   MEDICAL RECORD NO.:  [id number]          PATIENT TYPE:  AMB   LOCATION:  DSC                          FACILITY:  [facility]   PHYSICIAN:  Robert A. Thurston Hole, M.D. DATE OF BIRTH:  [birth year]   DATE OF PROCEDURE:  11/30/2006  DATE OF DISCHARGE:                               OPERATIVE REPORT   PREOPERATIVE DIAGNOSES:  1. Right shoulder partial rotator cuff tear, partial labrum tear and      partial biceps tendon tear.  2. Right shoulder chondromalacia.  3. Right shoulder impingement.  4. Right shoulder acromioclavicular joint degenerative joint disease      and spurring.   POSTOPERATIVE DIAGNOSES:  1. Right shoulder partial rotator cuff tear, partial labrum tear and      partial biceps tendon tear.  2. Right shoulder chondromalacia.  3. Right shoulder impingement.  4. Right shoulder acromioclavicular joint degenerative joint disease      and spurring.   PROCEDURES:  1. Right shoulder examination under anesthesia, followed by      arthroscopic debridement of partial rotator cuff tear, partial      labrum tear and partial biceps tendon tear.  2. Right shoulder chondroplasty.  3. Right shoulder subacromial decompression.  4. Right shoulder distal clavicle excision.   SURGEON:  Elana Alm. Thurston Hole, MD.   ASSISTANT:  Hardin Negus, PA.   ANESTHESIA:  General.   OPERATIVE TIME:  45 minutes.   COMPLICATIONS:  None.   INDICATIONS FOR PROCEDURES:  Jessica [name] is a 77 year old woman, who  fell 6 months ago injuring her right shoulder, with exam and MRI  documenting a partial versus complete rotator cuff tear, with labrum  tear and impingement, and AC joint spurring and arthropathy.  She has  failed conservative care and is now to undergo arthroscopy.   DESCRIPTION:  Jessica [name] was brought to the operating room on  November 30, 2006, after an interscalene block was placed in the holding  room by Anesthesia.  She was placed  on the operating table in a supine  position.  After being placed under general anesthesia, her right  shoulder was examined.  She had near full range of motion, and her  shoulder was stable to ligamentous exam.  She was placed in a beach-  chair position and her shoulder and arm were prepped using sterile  DuraPrep and draped using sterile technique.  Originally, through a  posterior arthroscopic portal, the arthroscope with a pump attached was  placed, and through an anterior portal an arthroscopic probe was placed.  She received Ancef 1 g IV preoperatively for prophylaxis.  On initial  inspection of the intra-articular portion of the shoulder, she was found  to have 50 to 60% grade 3 chondromalacia on the glenoid and humeral  head, and this was debrided.  She had partial tearing of the anterior,  superior and posterior labrum, 25 to 30%, which was debrided.  The  anterior inferior labrum and anterior inferior glenohumeral ligament  complex was intact.  The biceps tendon anchor was intact.  The biceps  tendon had 30 to  40% partial tearing, which was debrided.  The rotator  cuff showed a partial tear, 60 to 70% of the supraspinatus, which was  debrided, but it was otherwise well attached, and he had tearing of 30  to 40% of the infraspinatus, which was debrided.  The rest of the  rotator cuff was intact.  The inferior capsular recess showed a large  amount of erythematous synovitis, which was debrided and cauterized.  No  definite loose bodies were noted.  The subacromial space was entered,  and a lateral arthroscopic portal was made.  Moderately thickened  bursitis was resected.  The rotator cuff was very frayed on the bursal  surface, but no other significant tearing noted, and no definite  complete tear was found.  Impingement was noted and a subacromial  decompression was carried out, removing 6 mm of the undersurface of the  anterior, anterolateral and anteromedial acromion, and CA  ligament  release carried out as well.  The [facility] joint showed significant spurring  and degenerative changes, and the distal 5 to 6 mm of clavicle was  resected with a 6-mm bur.  After this was done, the shoulder could be  brought through a full range of motion with no impingement on the  rotator cuff.  At this point, it was felt that all pathology had been  satisfactorily addressed.  Instruments were removed.  The portals were  closed with 3-0 nylon suture.  Sterile dressings and a sling were  applied, and the patient was awakened and taken to the recovery room in  stable condition.   FOLLOWUP CARE:  Jessica [name] will be followed overnight for  observation, discharged tomorrow on Percocet for pain.  Begin early  physical therapy.  See me back in the office in a week for sutures out  and followup.      Robert A. Thurston Hole, M.D.  Electronically Signed     RAW/MEDQ  D:  11/30/2006  T:  11/30/2006  Job:  [id number]

## 2011-03-14 NOTE — H&P (Signed)
NAME:  Jessica [name], Jessica [name]                        ACCOUNT NO.:  [id number]   MEDICAL RECORD NO.:  [id number]                   PATIENT TYPE:  INP   LOCATION:  3025                                 FACILITY:  [facility]   PHYSICIAN:  Payton Doughty, M.D.                   DATE OF BIRTH:  1926/03/04   DATE OF ADMISSION:  06/21/2002  DATE OF DISCHARGE:                                HISTORY & PHYSICAL   ADMISSION DIAGNOSIS:  Herniated disk C3-C4.   HISTORY OF PRESENT ILLNESS:  The patient is a 77 year old right-handed white  lady whom I operated on eight years ago for herniated lumbar disk.  She did  well.  A couple of weeks ago, she fell [place] the [place] and landed  on top of her head and has had neck pain and pain in her arms ever since.  MRI's obtained show a large herniated disk at C3-4, and she is admitted for  an anterior cervicectomy and fusion.   PAST MEDICAL HISTORY:  Otherwise unremarkable.  She has had a knee  arthroscopy in the past and has undergone epidural steroids two years ago.   CURRENT MEDICATIONS:  Vicodin.   ALLERGIES:  None.   SOCIAL HISTORY:  She does not smoke, does not drink, and still works as an  Probation officer.   FAMILY HISTORY:  Not given.   REVIEW OF SYSTEMS:  Remarkable for neck pain and shoulder pain.   PHYSICAL EXAMINATION:  HEENT:  Within normal limits.  NECK:  She has limited range of motion in the neck secondary to pain.  CHEST:  Clear.  CARDIAC:  Regular rate and rhythm.  ABDOMEN:  Nontender with no hepatosplenomegaly.  EXTREMITIES:  Without clubbing or cyanosis.  GU:  Exam deferred.  PULSES:  Peripheral pulses are good.  NEUROLOGIC:  Awake, alert, and oriented.  Cranial nerves are intact.  Motor  exam shows 5/5 strength throughout the upper and lower extremities.  Dysesthesia as described in C4, C5, C6, and C7 distribution.  These occur  with neck extension and flexion.  Reflexes are 1 throughout.  Hoffman's is  negative.  Lower extremity  reflexes are non myelopathic.   LABORATORY DATA:  She has MRI that demonstrates large herniated disk at C3-4  with significant posterior displacement and compression of the spinal cord.   CLINICAL IMPRESSION:  Cervical herniated disk with early myopathy.    PLAN:  Anterior cervicectomy and fusion at C3-C4.  The risks and benefits of  this approach have been discussed, and she wishes to proceed.                                               Payton Doughty, M.D.    MWR/MEDQ  D:  06/21/2002  T:  06/22/2002  Job:  [id number]

## 2011-03-19 ENCOUNTER — Encounter: Payer: Self-pay | Admitting: Pulmonary Disease

## 2011-03-20 ENCOUNTER — Encounter: Payer: Self-pay | Admitting: Pulmonary Disease

## 2011-07-10 ENCOUNTER — Ambulatory Visit: Payer: Medicare Other | Admitting: Pulmonary Disease

## 2011-07-15 ENCOUNTER — Telehealth: Payer: Self-pay | Admitting: Pulmonary Disease

## 2011-07-15 NOTE — Telephone Encounter (Signed)
I spoke with sonja and advised her we did not have anything sooner. She states she will just get the kids out of school early

## 2011-07-17 ENCOUNTER — Encounter: Payer: Self-pay | Admitting: Pulmonary Disease

## 2011-07-17 ENCOUNTER — Ambulatory Visit (INDEPENDENT_AMBULATORY_CARE_PROVIDER_SITE_OTHER): Payer: Medicare Other | Admitting: Pulmonary Disease

## 2011-07-17 DIAGNOSIS — I872 Venous insufficiency (chronic) (peripheral): Secondary | ICD-10-CM

## 2011-07-17 DIAGNOSIS — M545 Low back pain: Secondary | ICD-10-CM

## 2011-07-17 DIAGNOSIS — E785 Hyperlipidemia, unspecified: Secondary | ICD-10-CM

## 2011-07-17 DIAGNOSIS — M1990 Unspecified osteoarthritis, unspecified site: Secondary | ICD-10-CM

## 2011-07-17 DIAGNOSIS — R413 Other amnesia: Secondary | ICD-10-CM

## 2011-07-17 DIAGNOSIS — F411 Generalized anxiety disorder: Secondary | ICD-10-CM

## 2011-07-17 DIAGNOSIS — Z23 Encounter for immunization: Secondary | ICD-10-CM

## 2011-07-17 DIAGNOSIS — I2510 Atherosclerotic heart disease of native coronary artery without angina pectoris: Secondary | ICD-10-CM

## 2011-07-17 DIAGNOSIS — IMO0001 Reserved for inherently not codable concepts without codable children: Secondary | ICD-10-CM

## 2011-07-17 DIAGNOSIS — R42 Dizziness and giddiness: Secondary | ICD-10-CM

## 2011-07-17 MED ORDER — ESCITALOPRAM OXALATE 10 MG PO TABS: 10.0000 mg | ORAL_TABLET | Freq: Every day | ORAL | Status: DC

## 2011-07-17 MED ORDER — ALPRAZOLAM 0.5 MG PO TABS: ORAL_TABLET | ORAL | Status: DC

## 2011-07-17 MED ORDER — CYCLOBENZAPRINE HCL 10 MG PO TABS: ORAL_TABLET | ORAL | Status: DC

## 2011-07-17 NOTE — Patient Instructions (Signed)
Today we updated your med list in EPIC...    Continue your current meds the same...  Call for any problems...  Let's plan a routine follw up in 6 months w/ FASTING blood work & CXR at that time.Marland KitchenMarland Kitchen

## 2011-07-17 NOTE — Progress Notes (Signed)
Subjective:    Patient ID: Jessica [name], female    DOB: 1925-11-02, 77 y.o.   MRN: [id number]  HPI 77 y/o WF here for a follow up visit... she has multiple medical problems as noted below...    ~  July 18, 2010:  she was hosp 8/17-19/11 by [facility] for CP- severe SSCP w/ N&V, NTG helped in ER & EKG w/ poor R progression/ NAD... pain was somewhat atyp & it hurt to breathe (neg CTAngio), but no apparent CWP/ tender... Enz were neg etc- and we discussed poss further cardiac eval w/ Myoview & she would like to proceed w/ this testing...  Risk Factors: +- FamHx, ex-smoker, LDL~117 on diet alone, neg HBP/ DM... no recurrent CP since disch- she has Vicodin for Prn use.  ~  October 07, 2010:  Myoview showed ?inferior ischemia, norm EF; she saw DrBensimhon for cards & offered med rx vs cath- chose med rx w/ ASA, Simva20, call for problems... she is c/o left knee pain & thinks it's "gout" & she has appt w/ DrKramer later today... she had cough, yellow sput, congestion & some SOB- Augmentin called in for her & we will add Depo/ Dosepak, Mucinex, Fluids... needs f/u FLP when able.  ~  January 06, 2011:  13mo ROV & stable> had another bronchitis 2/12 treated by TP w/ Doxy, Mucinex, Pred & improved;  CXR was clear, basilar scarring, no nodule seen, atherosclerotic changes in Ao, right humeral prosthesis;  she has hx AB & had CT Angio chest 8/11 from ER when she presented w/ CP (neg for PE & incidental 5mm RLL nodule seen)> due for f/u 6+mo CT to check RLL nodule & compare;  ex-smoker quit in 1993...    Hx CP> prev eval DrBensimhon & she opted for med Rx rather than more aggressive eval;  seen 1/12 and stable- no CP, palpit, SOB, etc; continue conserv approach & risk factor reduction strategy...    DJD, Psuedogout, LBP, FM, etc>  followed by DrKramer for Ortho & shot in knee helped for awhile but she says she needs TKR;  due for f/u DrDeveshwar later this month;  continue same meds...    Anxiety>  she remains under  stress but the Lexapro/ Alpraz really helps she says...    FASTING Labs>  all look good;  FLP on Simva20 showed TChol 183, TG 110, HDL 43, LDL 118...  ~  July 17, 2011:  3mo ROV & she reports right TKR at [facility] 7/12 by DrMartin, post op PT etc & now doing better, pleased w/ results;  Needs several refill meds & Flu shot today...    Hx Asthmatic Bronchitis> she denies recent exac and is not taking any regular breathing meds; she knows to avoid infections etc...    Hx Atyp CP> she had neg ER eval 8/11; subseq Cards eval by DrBensimhon w/ ?abn myoview & she opted for conservative approach & med Rx- ASA, Simva20, risk factor reduction strategy.    CHOL> on Simva20 + diet efforts w/ FLP 3/12 improved (LDL still 118 but she didn't want to incr dose, she'll try better diet)...    DJD> s/p right TKR 7/12 [facility] w/ post-op rehab etc; she is pleased w/ results; prev right shoulder arthroplasty 7/10 at [facility]; taking Vicodin prn pain...    Hx LBP> on Flexeril Tid as needed; she's had remote LLam & doing satis w/ daily exercises (reminded to continue exercise etc)...    FM, Psuedogout> followed by  DrDeveshwar on meds above + Colchicine 0.6mg /d...    Vit D Defic> her Vit D level has improved on her 1000u OTC supplement daily..    Memory Loss, Lacunar Infarct> on ASA daily, MRI in 2008 showed old lacunar infarct, sm vessel dis & atrophy...    Anxiety/ Depression> on Xanax & Lexapro; improved she says & confirmed by family, continue same meds...             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 she states doing well- just wants a ZPak for Prn use. ~  she is an ex-smoker having smoked from age 21 to 56 up to 2ppd, "but I just puffed, didn't inhale" ~  baseline CXR 8/08 showed clear lungs x scarring left base & DJD left shoulder... ~  PFT's 11/05 showed  FVC= 2.21 (81%), FEV1=1.35 (68%), %1sec=61, mid-flows=30%pred... ~  CT Angio Chest 8/11 showed 5mm nodule RLL w/ fu CT in 24mo rec... ~  12/11:  she had acute exac Rx w/ Augmentin, Depo/ Dosepak, Mucinex, etc... ~  3/12: f/u CTChest showed stable 5mm RUL nodule, some scarring & centrilob emphysema, coronary calcif is seen...  Hx of CHEST PAIN  (ICD-786.50) < SEE 8/17-19/11 HOSP > EKG, Enz, 2DEcho, CTA... subseq outpt Myoview showed ?ischemia, norm EF; eval by DrBensimhon w/ option for med rx vs cath & she chose med rx w/ ASA, Simva20...  VENOUS INSUFFICIENCY (ICD-459.81) - VI changes without swelling... prev tinea pedis on right foot betw 4th-5th toes has resolved w/ Lotrisone cream...  HYPERCHOLESTEROLEMIA, BORDERLINE (ICD-272.4) - on SIMVASTATIN 20mg /d + diet rx now... ~  FLP 2/10 showed TChol 179, TG 108, HDL 41, LDL 117... rec> better diet, incr exerc. ~  10/11:  started on Simva20 & she is due for f/u FLP- asked to ret fasting for this lab. ~  FLP 3/12 on Simva20 showed TChol 183, TG 110, HDL 43, LDL 118... She declined incr dose, therefore needs better diet.  UTI'S, HX OF (ICD-V13.00) - CT Angio chest 8/11 in hosp also showed ? hydronephrosis vs parapelvic cysts> subseq CT Abd confirmed cysts...  DEGENERATIVE JOINT DISEASE (ICD-715.90) - she is followed by DrDeveshwar for Rheum w/ Pseudogout (CPPD) on COLCHICINE 0.6mg /d; Osteoarthritis w/ right shoulder arthroplasty 7/10 at [facility]; & DDD... also followed by DrKramer who treated her left hand fracture after a fall last yr & has injected her knees...  she takes VICODIN Prn, FLEXERIL Prn, & COLCHICINE 0.6mg /d... ~  7/10:  s/p right Shoulder Arthroplasty at [facility] ~  12/11:  c/o right knee pain which she thinks is "gout" & she will f/u w/ DrKramer- severe DJD & needs TKR... ~  7/12:  s/p right TKR at [facility] by DrMartin...  LOW BACK PAIN SYNDROME (ICD-724.2) - she had prev Lumbar Lam & known DDD... she uses VICODIN and FLEXERIL as needed... she states  that she is stable without new complaints or concerns...  FIBROMYALGIA (ICD-729.1) - followed by DrDeveshwar and her notes are reviewed...  VITAMIN D DEFICIENCY (ICD-268.9) - on Vit D 1000 u OTC daily... ~  labs 8/09 showed Vit D level = 27... rec> start OTC Vit D supplement. ~  labs 9/10 showed Vit D level = 24... rec> take Vit D 1000 u daily. ~  labs 3/12 showed Vit D level = 41... continue 1000 u daily.  MEMORY LOSS (ICD-780.93) - she notes poor memory  and prev MRIBrain 11/08 showed old lacune in right thalamus, sm vessel dis, atrophy... she takes ASA 81mg /d...  Hx of VERTIGO (ICD-780.4) - uses Meclizine as needed...  ANXIETY (ICD-300.00) - she takes LEXAPRO 10mg /d and ALPRAZOLAM 0.5mg Tid Prn (both of which help her mood and anxiety)...  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   Past Surgical History  Procedure Date  . Cataract extraction   . Abdominal hysterectomy   . Anterior cervical discectomy   . Lumbar laminectomy   . Right knee arthroscopy   . Right shoulder surgery 2008    Dr. Thurston Hole  . Right shoulder replacement 04/2009    Outpatient Encounter Prescriptions as of 07/17/2011  Medication Sig Dispense Refill  . ALPRAZolam (XANAX) 0.5 MG tablet Take 1/2 to 1 tablet by mouth three times daily as needed for nerves--not to exceed 3 per day       . aspirin 81 MG tablet Take 81 mg by mouth daily.        . Calcium Carbonate-Vitamin D (CALTRATE 600+D) 600-400 MG-UNIT per tablet Take 1 tablet by mouth daily.        . Cholecalciferol (VITAMIN D) 1000 UNITS capsule Take 1,000 Units by mouth daily.        . colchicine 0.6 MG tablet Take 0.6 mg by mouth daily. Per Dr. Corliss Skains       . cyclobenzaprine (FLEXERIL) 10 MG tablet Take 1/2 to 1 tablet by mouth three times daily as directed for muscle spasms       . escitalopram (LEXAPRO) 10 MG tablet Take 10 mg by mouth daily.        Marland Kitchen HYDROcodone-acetaminophen (VICODIN) 5-500 MG per tablet  Take 1 tablet by mouth every 6 (six) hours as needed.        . meclizine (ANTIVERT) 25 MG tablet Take 1 tablet (25 mg total) by mouth 3 (three) times daily as needed (can take 1/2 to 1 tablet as needed for dizziness). Take 1/2 to 1 tablet by mouth three times daily as needed for dizziness  90 tablet  6  . simvastatin (ZOCOR) 20 MG tablet Take 20 mg by mouth at bedtime.        . Multiple Vitamins-Minerals (WOMENS MULTIVITAMIN PLUS) TABS Take 1 tablet by mouth daily.          No Known Allergies   Current Medications, Allergies, Past Medical History, Past Surgical History, Family History, and Social History were reviewed in [employer] record.    Review of Systems         See HPI - all other systems neg except as noted...  The patient complains of dyspnea on exertion and difficulty walking.  The patient denies anorexia, fever, weight loss, weight gain, vision loss, decreased hearing, hoarseness, chest pain, syncope, peripheral edema, prolonged cough, headaches, hemoptysis, abdominal pain, melena, hematochezia, severe indigestion/heartburn, hematuria, incontinence, muscle weakness, suspicious skin lesions, transient blindness, depression, unusual weight change, abnormal bleeding, enlarged lymph nodes, and angioedema.     Objective:   Physical Exam     WD, WN, 77 y/o WF in NAD... GENERAL:  Alert & oriented; pleasant & cooperative... HEENT:  Carrier/AT, EOM-wnl, PERRLA w/ nystagmus, EACs-clear, TMs-wnl, NOSE-clear, THROAT-clear & wnl. NECK:  Supple w/ decr ROM; no JVD; normal carotid impulses w/o bruits; no thyromegaly or nodules palpated; no lymphadenopathy. CHEST:  Clear to P & A; without wheezes/ rales/ or rhonchi. HEART:  Regular Rhythm; without murmurs/ rubs/ or  gallops. ABDOMEN:  Soft & nontender; normal bowel sounds; no organomegaly or masses detected. EXT: without deformities, mild arthritic changes; no varicose veins/ +venous insuffic/ tr edema. right shoulder well  healed surgical scar & can raise right arm mid level... NEURO:  CN's intact; she has congenital nystagmus, no focal neuro deficits... DERM:  No lesions noted; no rash etc...   Assessment & Plan:   Hx Asthmatic Bronchitis>  Stable & not taking any regular breathing meds; she knows to avoid infections etc; CTChest 3/12 is reviewed...     Hx Atyp CP>  Cards eval by DrBensimhon 2011 w/ ?abn myoview & she opted for conservative approach & med Rx- ASA, Simva20, risk factor reduction strategy.     CHOL>  on Simva20 + diet efforts w/ FLP 3/12 improved (LDL still 118 but she didn't want to incr dose, she'll try better diet)...     DJD>  s/p right TKR 7/12 by DrMartin at [facility] w/ post-op rehab etc; she is pleased w/ results; prev right shoulder arthroplasty 7/10 at [facility]; taking Vicodin prn pain...     Hx LBP>  on Flexeril Tid as needed; she's had remote LLam & doing satis w/ daily exercises (reminded to continue exercise etc)...     FM, Psuedogout>  followed by DrDeveshwar on meds above + Colchicine 0.6mg /d...     Vit D Defic>  her Vit D level has improved on her 1000u OTC supplement daily..     Memory Loss, Lacunar Infarct>  on ASA daily, MRI in 2008 showed old lacunar infarct, sm vessel dis & atrophy...     Anxiety/ Depression>  on Xanax & Lexapro; improved she says & confirmed by family, continue same meds.Marland KitchenMarland Kitchen

## 2011-07-18 ENCOUNTER — Ambulatory Visit: Payer: Medicare Other | Admitting: Pulmonary Disease

## 2011-07-21 ENCOUNTER — Encounter: Payer: Self-pay | Admitting: Pulmonary Disease

## 2011-08-29 ENCOUNTER — Other Ambulatory Visit: Payer: Self-pay | Admitting: Internal Medicine

## 2011-11-07 DIAGNOSIS — I510 Cardiac septal defect, acquired: Secondary | ICD-10-CM | POA: Diagnosis not present

## 2011-11-11 DIAGNOSIS — Z96659 Presence of unspecified artificial knee joint: Secondary | ICD-10-CM | POA: Diagnosis not present

## 2011-11-11 DIAGNOSIS — M1710 Unilateral primary osteoarthritis, unspecified knee: Secondary | ICD-10-CM | POA: Diagnosis not present

## 2011-11-11 DIAGNOSIS — G5600 Carpal tunnel syndrome, unspecified upper limb: Secondary | ICD-10-CM | POA: Diagnosis not present

## 2011-11-11 DIAGNOSIS — I10 Essential (primary) hypertension: Secondary | ICD-10-CM | POA: Diagnosis not present

## 2011-11-11 DIAGNOSIS — Z96619 Presence of unspecified artificial shoulder joint: Secondary | ICD-10-CM | POA: Diagnosis not present

## 2011-11-25 DIAGNOSIS — G5600 Carpal tunnel syndrome, unspecified upper limb: Secondary | ICD-10-CM | POA: Diagnosis not present

## 2011-12-09 DIAGNOSIS — H353290 Exudative age-related macular degeneration, unspecified eye, stage unspecified: Secondary | ICD-10-CM | POA: Diagnosis not present

## 2011-12-09 DIAGNOSIS — H353190 Nonexudative age-related macular degeneration, unspecified eye, stage unspecified: Secondary | ICD-10-CM | POA: Diagnosis not present

## 2011-12-09 DIAGNOSIS — H43819 Vitreous degeneration, unspecified eye: Secondary | ICD-10-CM | POA: Diagnosis not present

## 2011-12-11 DIAGNOSIS — Z96659 Presence of unspecified artificial knee joint: Secondary | ICD-10-CM | POA: Diagnosis not present

## 2011-12-11 DIAGNOSIS — M25469 Effusion, unspecified knee: Secondary | ICD-10-CM | POA: Diagnosis not present

## 2011-12-11 DIAGNOSIS — IMO0002 Reserved for concepts with insufficient information to code with codable children: Secondary | ICD-10-CM | POA: Diagnosis not present

## 2011-12-11 DIAGNOSIS — M1710 Unilateral primary osteoarthritis, unspecified knee: Secondary | ICD-10-CM | POA: Diagnosis not present

## 2011-12-11 DIAGNOSIS — M25569 Pain in unspecified knee: Secondary | ICD-10-CM | POA: Diagnosis not present

## 2011-12-22 DIAGNOSIS — G5600 Carpal tunnel syndrome, unspecified upper limb: Secondary | ICD-10-CM | POA: Diagnosis not present

## 2011-12-26 HISTORY — PX: CARPAL TUNNEL RELEASE: SHX101

## 2012-01-12 DIAGNOSIS — L570 Actinic keratosis: Secondary | ICD-10-CM | POA: Diagnosis not present

## 2012-01-12 DIAGNOSIS — L819 Disorder of pigmentation, unspecified: Secondary | ICD-10-CM | POA: Diagnosis not present

## 2012-01-12 DIAGNOSIS — L821 Other seborrheic keratosis: Secondary | ICD-10-CM | POA: Diagnosis not present

## 2012-01-14 ENCOUNTER — Ambulatory Visit (INDEPENDENT_AMBULATORY_CARE_PROVIDER_SITE_OTHER)
Admission: RE | Admit: 2012-01-14 | Discharge: 2012-01-14 | Disposition: A | Discharge status: Home / Self Care | Payer: Medicare Other | Source: Ambulatory Visit | Attending: Pulmonary Disease | Admitting: Pulmonary Disease

## 2012-01-14 ENCOUNTER — Ambulatory Visit (INDEPENDENT_AMBULATORY_CARE_PROVIDER_SITE_OTHER): Payer: Medicare Other | Admitting: Pulmonary Disease

## 2012-01-14 ENCOUNTER — Other Ambulatory Visit (INDEPENDENT_AMBULATORY_CARE_PROVIDER_SITE_OTHER): Payer: Medicare Other

## 2012-01-14 ENCOUNTER — Encounter: Payer: Self-pay | Admitting: Pulmonary Disease

## 2012-01-14 VITALS — BP 120/74 | HR 75 | Temp 97.1°F | Ht 65.0 in | Wt 144.6 lb

## 2012-01-14 DIAGNOSIS — J209 Acute bronchitis, unspecified: Secondary | ICD-10-CM | POA: Diagnosis not present

## 2012-01-14 DIAGNOSIS — J984 Other disorders of lung: Secondary | ICD-10-CM | POA: Diagnosis not present

## 2012-01-14 DIAGNOSIS — I872 Venous insufficiency (chronic) (peripheral): Secondary | ICD-10-CM

## 2012-01-14 DIAGNOSIS — F411 Generalized anxiety disorder: Secondary | ICD-10-CM

## 2012-01-14 DIAGNOSIS — M545 Low back pain: Secondary | ICD-10-CM

## 2012-01-14 DIAGNOSIS — R413 Other amnesia: Secondary | ICD-10-CM

## 2012-01-14 DIAGNOSIS — M1990 Unspecified osteoarthritis, unspecified site: Secondary | ICD-10-CM

## 2012-01-14 DIAGNOSIS — E785 Hyperlipidemia, unspecified: Secondary | ICD-10-CM

## 2012-01-14 DIAGNOSIS — R918 Other nonspecific abnormal finding of lung field: Secondary | ICD-10-CM | POA: Diagnosis not present

## 2012-01-14 DIAGNOSIS — IMO0001 Reserved for inherently not codable concepts without codable children: Secondary | ICD-10-CM

## 2012-01-14 LAB — CBC WITH DIFFERENTIAL/PLATELET
Basophils Absolute: 0 10*3/uL (ref 0.0–0.1)
Lymphocytes Relative: 20.8 % (ref 12.0–46.0)
Lymphs Abs: 1.2 10*3/uL (ref 0.7–4.0)
Monocytes Relative: 8.1 % (ref 3.0–12.0)
Neutrophils Relative %: 69.6 % (ref 43.0–77.0)
Platelets: 295 10*3/uL (ref 150.0–400.0)
RDW: 14.3 % (ref 11.5–14.6)

## 2012-01-14 LAB — BASIC METABOLIC PANEL
CO2: 26 mEq/L (ref 19–32)
Chloride: 102 mEq/L (ref 96–112)
Creatinine, Ser: 0.7 mg/dL (ref 0.4–1.2)
Sodium: 138 mEq/L (ref 135–145)

## 2012-01-14 LAB — HEPATIC FUNCTION PANEL
ALT: 13 U/L (ref 0–35)
AST: 20 U/L (ref 0–37)
Albumin: 3.9 g/dL (ref 3.5–5.2)
Alkaline Phosphatase: 123 U/L — ABNORMAL HIGH (ref 39–117)
Total Protein: 7.1 g/dL (ref 6.0–8.3)

## 2012-01-14 LAB — LIPID PANEL
Total CHOL/HDL Ratio: 3
Triglycerides: 55 mg/dL (ref 0.0–149.0)

## 2012-01-14 LAB — TSH: TSH: 1.4 u[IU]/mL (ref 0.35–5.50)

## 2012-01-14 MED ORDER — ALPRAZOLAM 0.5 MG PO TABS: ORAL_TABLET | ORAL | Status: DC

## 2012-01-14 NOTE — Progress Notes (Signed)
Subjective:    Patient ID: Jessica [name], female    DOB: August 11, 1926, 78 y.o.   MRN: [id number]  HPI 78 y/o WF here for a follow up visit... she has multiple medical problems as noted below...    ~  July 18, 2010:  she was hosp 8/17-19/11 by [facility] for CP- severe SSCP w/ N&V, NTG helped in ER & EKG w/ poor R progression/ NAD... pain was somewhat atyp & it hurt to breathe (neg CTAngio), but no apparent CWP/ tender... Enz were neg etc- and we discussed poss further cardiac eval w/ Myoview & she would like to proceed w/ this testing...  Risk Factors: +- FamHx, ex-smoker, LDL~117 on diet alone, neg HBP/ DM... no recurrent CP since disch- she has Vicodin for Prn use.  ~  October 07, 2010:  Myoview showed ?inferior ischemia, norm EF; she saw DrBensimhon for cards & offered med rx vs cath- chose med rx w/ ASA, Simva20, call for problems... she is c/o left knee pain & thinks it's "gout" & she has appt w/ DrKramer later today... she had cough, yellow sput, congestion & some SOB- Augmentin called in for her & we will add Depo/ Dosepak, Mucinex, Fluids... needs f/u FLP when able.  ~  January 06, 2011:  75mo ROV & stable> had another bronchitis 2/12 treated by TP w/ Doxy, Mucinex, Pred & improved;  CXR was clear, basilar scarring, no nodule seen, atherosclerotic changes in Ao, right humeral prosthesis;  she has hx AB & had CT Angio chest 8/11 from ER when she presented w/ CP (neg for PE & incidental 5mm RLL nodule seen)> due for f/u 6+mo CT to check RLL nodule & compare;  ex-smoker quit in 1993...    Hx CP> prev eval DrBensimhon & she opted for med Rx rather than more aggressive eval;  seen 1/12 and stable- no CP, palpit, SOB, etc; continue conserv approach & risk factor reduction strategy...    DJD, Psuedogout, LBP, FM, etc>  followed by DrKramer for Ortho & shot in knee helped for awhile but she says she needs TKR;  due for f/u DrDeveshwar later this month;  continue same meds...    Anxiety>  she remains under  stress but the Lexapro/ Alpraz really helps she says...    FASTING Labs>  all look good;  FLP on Simva20 showed TChol 183, TG 110, HDL 43, LDL 118...  ~  July 17, 2011:  30mo ROV & she reports right TKR at [facility] 7/12 by DrMartin, post op PT etc & now doing better, pleased w/ results;  Needs several refill meds & Flu shot today...    Hx Asthmatic Bronchitis> she denies recent exac and is not taking any regular breathing meds; she knows to avoid infections etc...    Hx Atyp CP> she had neg ER eval 8/11; subseq Cards eval by DrBensimhon w/ ?abn myoview & she opted for conservative approach & med Rx- ASA, Simva20, risk factor reduction strategy.    CHOL> on Simva20 + diet efforts w/ FLP 3/12 improved (LDL still 118 but she didn't want to incr dose, she'll try better diet)...    DJD> s/p right TKR 7/12 by DrMartin at [facility] w/ post-op rehab etc; she is pleased w/ results; prev right shoulder arthroplasty 7/10 at [facility]; taking Vicodin prn pain...    Hx LBP> on Flexeril Tid as needed; she's had remote LLam & doing satis w/ daily exercises (reminded to continue exercise etc)...    FM, Psuedogout> followed by  DrDeveshwar on meds above + Colchicine 0.6mg /d...    Vit D Defic> her Vit D level has improved on her 1000u OTC supplement daily..    Memory Loss, Lacunar Infarct> on ASA daily, MRI in 2008 showed old lacunar infarct, sm vessel dis & atrophy...    Anxiety/ Depression> on Xanax & Lexapro; improved she says & confirmed by family, continue same meds...  ~  January 14, 2012:  62mo ROV & Jessica [name] tells me that she had right CTS surg by DrWeisler at [facility] about 3 weeks ago- still having some pain but improved;  She wants [name] to refill her [name] today...    Hx Asthmatic Bronchitis> she denies recent exac and is not taking any regular breathing meds; she knows to avoid infections etc...    Hx Atyp CP> she had Cards eval 2011 by DrBensimhon w/ ?abn myoview & she opted for conservative approach & med Rx- ASA, Simva20, risk  factor reduction strategy; states she is doing well & denies recent CP, palpit, ch in SOB, etc...    CHOL> on Simva20 + diet efforts w/ FLP showing TChol 163, TG 55, HDL 65, LDL 87    DJD> s/p recent right CTS surg by  DrWeisler at [facility]; s/p right TKR 7/12 by DrMartin at [facility] w/ post-op rehab etc; prev right shoulder arthroplasty 7/10 at [facility]; she also sees DrDeveshwar> on Colchicine 0.6mg /d, Flexeril prn, Vicodin prn...    Hx LBP> she's had remote LLam & doing satis w/ daily exercises (reminded to continue exercise etc)...    FM, Psuedogout> followed by DrDeveshwar on meds above...    Vit D Defic> her Vit D level has improved on her 1000u OTC supplement daily..    Memory Loss, Lacunar Infarct> on ASA daily, MRI in 2008 showed old lacunar infarct, sm vessel dis & atrophy...    Anxiety/ Depression> on Xanax & Lexapro; improved she says & confirmed by family, continue same meds... CXR 3/13showed stable heart size, clear lungs w/ some hyperinflation, right humeral prosthesis, DJD in spine... LABS 3/12:  FLP- at goals on Simva20;  Chems- wnl;  CBC- wnl;  TSH=1.40          Problem List:   CONGENITAL NYSTAGMUS (ICD-379.51)  ASTHMATIC BRONCHITIS, ACUTE (ICD-466.0) - she has stopped her prev Advair therapy and denies any prob w/ cough, sputum, dyspnea, etc... she states doing well- just wants a ZPak for Prn use. ~  she is an ex-smoker having smoked from age 5 to 47 up to 2ppd, "but I just puffed, didn't inhale" ~  baseline CXR 8/08 showed clear lungs x scarring left base & DJD left shoulder... ~  PFT's 11/05 showed FVC= 2.21 (81%), FEV1=1.35 (68%), %1sec=61, mid-flows=30%pred... ~  CT Angio Chest 8/11 showed 5mm nodule RLL w/ fu CT in 62mo rec... ~  12/11:  she had acute exac Rx w/ Augmentin, Depo/ Dosepak, Mucinex, etc... ~  3/12: f/u CTChest showed stable 5mm RUL nodule, some scarring & centrilob emphysema, coronary calcif is seen... ~  CXR 3/13showed stable heart size, clear lungs w/ some  hyperinflation, right humeral prosthesis, DJD in spine...  Hx of CHEST PAIN  (ICD-786.50) < SEE 8/17-19/11 HOSP > EKG, Enz, 2DEcho, CTA... subseq outpt Myoview showed ?ischemia, norm EF; eval by DrBensimhon w/ option for med rx vs cath & she chose med rx w/ ASA, Simva20...  VENOUS INSUFFICIENCY (ICD-459.81) - VI changes without swelling... prev tinea pedis on right foot betw 4th-5th toes has resolved w/ Lotrisone cream...  HYPERCHOLESTEROLEMIA, BORDERLINE (ICD-272.4) -  on SIMVASTATIN 20mg /d + diet rx now... ~  FLP 2/10 showed TChol 179, TG 108, HDL 41, LDL 117... rec> better diet, incr exerc. ~  10/11:  started on Simva20 & she is due for f/u FLP- asked to ret fasting for this lab. ~  FLP 3/12 on Simva20 showed TChol 183, TG 110, HDL 43, LDL 118... She declined incr dose, therefore needs better diet. ~  FLP 3/13 on Simva20 showed TChol 163, TG 55, HDL 65, LDL 87  UTI'S, HX OF (ICD-V13.00) - CT Angio chest 8/11 in hosp also showed ? hydronephrosis vs parapelvic cysts> subseq CT Abd confirmed cysts...  DEGENERATIVE JOINT DISEASE (ICD-715.90) - she is followed by DrDeveshwar for Rheum w/ Pseudogout (CPPD) on COLCHICINE 0.6mg /d; Osteoarthritis w/ right shoulder arthroplasty 7/10 at [facility]; & DDD... also followed by DrKramer who treated her left hand fracture after a fall last yr & has injected her knees...  she takes VICODIN Prn, FLEXERIL Prn, & COLCHICINE 0.6mg /d... ~  7/10:  s/p right Shoulder Arthroplasty at [facility] ~  12/11:  c/o right knee pain which she thinks is "gout" & she will f/u w/ DrKramer- severe DJD & needs TKR... ~  7/12:  s/p right TKR at [facility] by DrMartin... ~  3/13: s/p right CTS surg at [facility] by DrWeisler  LOW BACK PAIN SYNDROME (ICD-724.2) - she had prev Lumbar Lam & known DDD... she uses VICODIN and FLEXERIL as needed... she states that she is stable without new complaints or concerns...  FIBROMYALGIA (ICD-729.1) - followed by DrDeveshwar and her notes are reviewed...  VITAMIN D  DEFICIENCY (ICD-268.9) - on Vit D 1000 u OTC daily... ~  labs 8/09 showed Vit D level = 27... rec> start OTC Vit D supplement. ~  labs 9/10 showed Vit D level = 24... rec> take Vit D 1000 u daily. ~  labs 3/12 showed Vit D level = 41... continue 1000 u daily.  MEMORY LOSS (ICD-780.93) - she notes poor memory and prev MRIBrain 11/08 showed old lacune in right thalamus, sm vessel dis, atrophy... she takes ASA 81mg /d...  Hx of VERTIGO (ICD-780.4) - uses Meclizine as needed...  ANXIETY (ICD-300.00) - she takes LEXAPRO 10mg /d and ALPRAZOLAM 0.5mg Tid Prn (both of which help her mood and anxiety)...  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   Past Surgical History  Procedure Date  . Cataract extraction   . Abdominal hysterectomy   . Anterior cervical discectomy   . Lumbar laminectomy   . Right knee arthroscopy   . Right shoulder surgery 2008    Dr. Thurston Hole  . Right shoulder replacement 04/2009  . Carpal tunnel release 12/2011    right arm    Outpatient Encounter Prescriptions as of 01/14/2012  Medication Sig Dispense Refill  . ALPRAZolam (XANAX) 0.5 MG tablet Take 1/2 to 1 tablet by mouth three times daily as needed for nerves--not to exceed 3 per day  90 tablet  5  . aspirin 81 MG tablet Take 81 mg by mouth daily.        . Calcium Carbonate-Vitamin D (CALTRATE 600+D) 600-400 MG-UNIT per tablet Take 1 tablet by mouth daily.        . Cholecalciferol (VITAMIN D) 1000 UNITS capsule Take 1,000 Units by mouth daily.        . colchicine 0.6 MG tablet Take 0.6 mg by mouth daily. Per Dr. Corliss Skains       . cyclobenzaprine (FLEXERIL) 10 MG tablet Take 1/2  to 1 tablet by mouth three times daily as directed for muscle spasms  90 tablet  5  . escitalopram (LEXAPRO) 10 MG tablet Take 1 tablet (10 mg total) by mouth daily.  30 tablet  11  . HYDROcodone-acetaminophen (VICODIN) 5-500 MG per tablet Take 1 tablet by mouth every 6 (six) hours as needed.        .  meclizine (ANTIVERT) 25 MG tablet Take 1/2 to 1 tablet by mouth three times daily as needed for dizziness      . Multiple Vitamins-Minerals (WOMENS MULTIVITAMIN PLUS) TABS Take 1 tablet by mouth daily.        . simvastatin (ZOCOR) 20 MG tablet TAKE  ONE TABLET BY MOUTH NIGHTLY AT BEDTIME  30 tablet  5  . DISCONTD: meclizine (ANTIVERT) 25 MG tablet Take 1 tablet (25 mg total) by mouth 3 (three) times daily as needed (can take 1/2 to 1 tablet as needed for dizziness). Take 1/2 to 1 tablet by mouth three times daily as needed for dizziness  90 tablet  6    No Known Allergies   Current Medications, Allergies, Past Medical History, Past Surgical History, Family History, and Social History were reviewed in [employer] record.    Review of Systems         See HPI - all other systems neg except as noted...  The patient complains of dyspnea on exertion and difficulty walking.  The patient denies anorexia, fever, weight loss, weight gain, vision loss, decreased hearing, hoarseness, chest pain, syncope, peripheral edema, prolonged cough, headaches, hemoptysis, abdominal pain, melena, hematochezia, severe indigestion/heartburn, hematuria, incontinence, muscle weakness, suspicious skin lesions, transient blindness, depression, unusual weight change, abnormal bleeding, enlarged lymph nodes, and angioedema.     Objective:   Physical Exam     WD, WN, 78 y/o WF in NAD... GENERAL:  Alert & oriented; pleasant & cooperative... HEENT:  [place]/AT, EOM-wnl, PERRLA w/ nystagmus, EACs-clear, TMs-wnl, NOSE-clear, THROAT-clear & wnl. NECK:  Supple w/ decr ROM; no JVD; normal carotid impulses w/o bruits; no thyromegaly or nodules palpated; no lymphadenopathy. CHEST:  Clear to P & A; without wheezes/ rales/ or rhonchi. HEART:  Regular Rhythm; without murmurs/ rubs/ or gallops. ABDOMEN:  Soft & nontender; normal bowel sounds; no organomegaly or masses detected. EXT: without deformities, mild  arthritic changes; no varicose veins/ +venous insuffic/ tr edema. right shoulder well healed surgical scar & can raise right arm mid level... NEURO:  CN's intact; she has congenital nystagmus, no focal neuro deficits... DERM:  No lesions noted; no rash etc...  RADIOLOGY DATA:  Reviewed in the EPIC EMR & discussed w/ the patient...    >>CXR 3/13showed stable heart size, clear lungs w/ some hyperinflation, right humeral prosthesis, DJD in spine...  LABORATORY DATA:  Reviewed in the EPIC EMR & discussed w/ the patient...    >>LABS 3/12:  FLP- at goals on Simva20;  Chems- wnl;  CBC- wnl;  TSH=1.40   Assessment & Plan:   Hx Asthmatic Bronchitis>  Stable & not taking any regular breathing meds; she knows to avoid infections etc; CXR clear, NAD...     Hx Atyp CP>  Cards eval by DrBensimhon 2011 w/ ?abn myoview & she opted for conservative approach & med Rx- ASA, Simva20, risk factor reduction strategy.     CHOL>  on Simva20 + diet efforts w/ FLP looks good w/ parameters at goal...     DJD>  s/p right TKR 7/12 by DrMartin at [facility] w/ post-op rehab  etc; she is pleased w/ results; prev right shoulder arthroplasty 7/10 at [facility]; taking Vicodin prn pain...     Hx LBP>  on Flexeril Tid as needed; she's had remote LLam & doing satis w/ daily exercises (reminded to continue exercise etc)...     FM, Psuedogout>  followed by DrDeveshwar on meds above + Colchicine 0.6mg /d...     Vit D Defic>  her Vit D level has improved on her 1000u OTC supplement daily..     Memory Loss, Lacunar Infarct>  on ASA daily, MRI in 2008 showed old lacunar infarct, sm vessel dis & atrophy...     Anxiety/ Depression>  on Xanax & Lexapro; improved she says & confirmed by family, continue same meds...   Patient's Medications  New Prescriptions   No medications on file  Previous Medications   ASPIRIN 81 MG TABLET    Take 81 mg by mouth daily.     CALCIUM CARBONATE-VITAMIN D (CALTRATE 600+D) 600-400 MG-UNIT PER TABLET    Take  1 tablet by mouth daily.     CHOLECALCIFEROL (VITAMIN D) 1000 UNITS CAPSULE    Take 1,000 Units by mouth daily.     COLCHICINE 0.6 MG TABLET    Take 0.6 mg by mouth daily. Per Dr. Corliss Skains    CYCLOBENZAPRINE (FLEXERIL) 10 MG TABLET    Take 1/2 to 1 tablet by mouth three times daily as directed for muscle spasms   ESCITALOPRAM (LEXAPRO) 10 MG TABLET    Take 1 tablet (10 mg total) by mouth daily.   HYDROCODONE-ACETAMINOPHEN (VICODIN) 5-500 MG PER TABLET    Take 1 tablet by mouth every 6 (six) hours as needed.     MULTIPLE VITAMINS-MINERALS (WOMENS MULTIVITAMIN PLUS) TABS    Take 1 tablet by mouth daily.     SIMVASTATIN (ZOCOR) 20 MG TABLET    TAKE  ONE TABLET BY MOUTH NIGHTLY AT BEDTIME  Modified Medications   Modified Medication Previous Medication   ALPRAZOLAM (XANAX) 0.5 MG TABLET ALPRAZolam (XANAX) 0.5 MG tablet      Take 1/2 to 1 tablet by mouth three times daily as needed for nerves--not to exceed 3 per day    Take 1/2 to 1 tablet by mouth three times daily as needed for nerves--not to exceed 3 per day   MECLIZINE (ANTIVERT) 25 MG TABLET meclizine (ANTIVERT) 25 MG tablet      Take 1/2 to 1 tablet by mouth three times daily as needed for dizziness    Take 1 tablet (25 mg total) by mouth 3 (three) times daily as needed (can take 1/2 to 1 tablet as needed for dizziness). Take 1/2 to 1 tablet by mouth three times daily as needed for dizziness  Discontinued Medications   No medications on file

## 2012-01-14 NOTE — Patient Instructions (Signed)
Today we updated your med list in our EPIC system...    Continue your current medications the same...  Today we did your follow up CXR & Fasting blood work...    We will call you w/ these results...  Stay as active as poss, and keep up the good work w/ diet & your weight...  Call for any questions...  Let's plan a follow up visit in 6 months, sooner if needed for any reason.Marland KitchenMarland Kitchen

## 2012-01-21 ENCOUNTER — Telehealth: Payer: Self-pay | Admitting: Pulmonary Disease

## 2012-01-21 NOTE — Telephone Encounter (Signed)
Called and spoke with pts daughter about her lab and cxr results.  Daughter voiced her understanding of these..  See result notes

## 2012-03-15 DIAGNOSIS — H31019 Macula scars of posterior pole (postinflammatory) (post-traumatic), unspecified eye: Secondary | ICD-10-CM | POA: Diagnosis not present

## 2012-03-15 DIAGNOSIS — H35059 Retinal neovascularization, unspecified, unspecified eye: Secondary | ICD-10-CM | POA: Diagnosis not present

## 2012-03-15 DIAGNOSIS — H353290 Exudative age-related macular degeneration, unspecified eye, stage unspecified: Secondary | ICD-10-CM | POA: Diagnosis not present

## 2012-03-15 DIAGNOSIS — H43819 Vitreous degeneration, unspecified eye: Secondary | ICD-10-CM | POA: Diagnosis not present

## 2012-04-08 DIAGNOSIS — S2239XA Fracture of one rib, unspecified side, initial encounter for closed fracture: Secondary | ICD-10-CM | POA: Diagnosis not present

## 2012-04-12 ENCOUNTER — Telehealth: Payer: Self-pay | Admitting: Pulmonary Disease

## 2012-04-12 MED ORDER — HYDROCOD POLST-CHLORPHEN POLST 10-8 MG/5ML PO LQCR: 5.0000 mL | Freq: Two times a day (BID) | ORAL | Status: DC

## 2012-04-12 MED ORDER — AZITHROMYCIN 250 MG PO TABS: ORAL_TABLET | ORAL | Status: AC

## 2012-04-12 NOTE — Telephone Encounter (Signed)
I spoke with daughter and pt c/o cough w/ very little clear phlem, chest congestion, nasal congestion x Friday. Denies any f/c/s/nv. Pt taking mucniex QD. Pt currently has a fx rib and it hurts her when she is coughing. Daughter wants rx called in to help pt get over this. Please advise SN THANKS  No Known Allergies    [employer] Jessica [name]

## 2012-04-12 NOTE — Telephone Encounter (Signed)
Called and lmom to make daughter aware of these meds that have been sent to the pharmacy per SN recs.  Told to call back for any questions or concerns.

## 2012-04-12 NOTE — Telephone Encounter (Signed)
Per SN--ok to call in zpak #1  Take as directed with no refills, mucinex 2 po bid with plenty of fluids, call in tussionex #4oz  1 tsp every 12 hours prn cough.  thanks

## 2012-06-01 DIAGNOSIS — H6120 Impacted cerumen, unspecified ear: Secondary | ICD-10-CM | POA: Diagnosis not present

## 2012-06-01 DIAGNOSIS — R42 Dizziness and giddiness: Secondary | ICD-10-CM | POA: Diagnosis not present

## 2012-06-15 DIAGNOSIS — H353290 Exudative age-related macular degeneration, unspecified eye, stage unspecified: Secondary | ICD-10-CM | POA: Diagnosis not present

## 2012-06-15 DIAGNOSIS — H31019 Macula scars of posterior pole (postinflammatory) (post-traumatic), unspecified eye: Secondary | ICD-10-CM | POA: Diagnosis not present

## 2012-06-15 DIAGNOSIS — H35059 Retinal neovascularization, unspecified, unspecified eye: Secondary | ICD-10-CM | POA: Diagnosis not present

## 2012-06-15 DIAGNOSIS — H43819 Vitreous degeneration, unspecified eye: Secondary | ICD-10-CM | POA: Diagnosis not present

## 2012-07-16 ENCOUNTER — Encounter: Payer: Self-pay | Admitting: Pulmonary Disease

## 2012-07-16 ENCOUNTER — Ambulatory Visit (INDEPENDENT_AMBULATORY_CARE_PROVIDER_SITE_OTHER): Payer: Medicare Other | Admitting: Pulmonary Disease

## 2012-07-16 VITALS — BP 132/62 | HR 73 | Temp 97.6°F | Ht 65.0 in | Wt 149.0 lb

## 2012-07-16 DIAGNOSIS — M1990 Unspecified osteoarthritis, unspecified site: Secondary | ICD-10-CM

## 2012-07-16 DIAGNOSIS — F411 Generalized anxiety disorder: Secondary | ICD-10-CM

## 2012-07-16 DIAGNOSIS — E785 Hyperlipidemia, unspecified: Secondary | ICD-10-CM

## 2012-07-16 DIAGNOSIS — E559 Vitamin D deficiency, unspecified: Secondary | ICD-10-CM

## 2012-07-16 DIAGNOSIS — I2510 Atherosclerotic heart disease of native coronary artery without angina pectoris: Secondary | ICD-10-CM | POA: Diagnosis not present

## 2012-07-16 DIAGNOSIS — J209 Acute bronchitis, unspecified: Secondary | ICD-10-CM | POA: Diagnosis not present

## 2012-07-16 DIAGNOSIS — IMO0001 Reserved for inherently not codable concepts without codable children: Secondary | ICD-10-CM

## 2012-07-16 DIAGNOSIS — I872 Venous insufficiency (chronic) (peripheral): Secondary | ICD-10-CM | POA: Diagnosis not present

## 2012-07-16 DIAGNOSIS — R413 Other amnesia: Secondary | ICD-10-CM

## 2012-07-16 DIAGNOSIS — M545 Low back pain, unspecified: Secondary | ICD-10-CM

## 2012-07-16 MED ORDER — HYDROCOD POLST-CHLORPHEN POLST 10-8 MG/5ML PO LQCR: 5.0000 mL | Freq: Two times a day (BID) | ORAL | Status: DC

## 2012-07-16 MED ORDER — SIMVASTATIN 20 MG PO TABS: 20.0000 mg | ORAL_TABLET | Freq: Every day | ORAL | Status: DC

## 2012-07-16 MED ORDER — CYCLOBENZAPRINE HCL 10 MG PO TABS: ORAL_TABLET | ORAL | Status: DC

## 2012-07-16 MED ORDER — ALPRAZOLAM 0.5 MG PO TABS: ORAL_TABLET | ORAL | Status: DC

## 2012-07-16 MED ORDER — ESCITALOPRAM OXALATE 10 MG PO TABS: 10.0000 mg | ORAL_TABLET | Freq: Every day | ORAL | Status: DC

## 2012-07-16 NOTE — Patient Instructions (Addendum)
Today we updated your med list in our EPIC system...    Continue your current medications the same...    We refilled your meds per request...  We gave you the 2013 flu shot today...  We wrote a prescription for a Shingles vaccine which you can fill at your local [facility] shot [facility]...  Call for any questions...  Let's plan a follow up visit in about 6 months w/ FASTING blood work at that time.Marland KitchenMarland Kitchen

## 2012-07-16 NOTE — Progress Notes (Signed)
Subjective:    Patient ID: Jessica [name], female    DOB: August 11, 1926, 78 y.o.   MRN: [id number]  HPI 78 y/o WF here for a follow up visit... she has multiple medical problems as noted below...    ~  July 18, 2010:  she was hosp 8/17-19/11 by [facility] for CP- severe SSCP w/ N&V, NTG helped in ER & EKG w/ poor R progression/ NAD... pain was somewhat atyp & it hurt to breathe (neg CTAngio), but no apparent CWP/ tender... Enz were neg etc- and we discussed poss further cardiac eval w/ Myoview & she would like to proceed w/ this testing...  Risk Factors: +- FamHx, ex-smoker, LDL~117 on diet alone, neg HBP/ DM... no recurrent CP since disch- she has Vicodin for Prn use.  ~  October 07, 2010:  Myoview showed ?inferior ischemia, norm EF; she saw DrBensimhon for cards & offered med rx vs cath- chose med rx w/ ASA, Simva20, call for problems... she is c/o left knee pain & thinks it's "gout" & she has appt w/ DrKramer later today... she had cough, yellow sput, congestion & some SOB- Augmentin called in for her & we will add Depo/ Dosepak, Mucinex, Fluids... needs f/u FLP when able.  ~  January 06, 2011:  75mo ROV & stable> had another bronchitis 2/12 treated by TP w/ Doxy, Mucinex, Pred & improved;  CXR was clear, basilar scarring, no nodule seen, atherosclerotic changes in Ao, right humeral prosthesis;  she has hx AB & had CT Angio chest 8/11 from ER when she presented w/ CP (neg for PE & incidental 5mm RLL nodule seen)> due for f/u 6+mo CT to check RLL nodule & compare;  ex-smoker quit in 1993...    Hx CP> prev eval DrBensimhon & she opted for med Rx rather than more aggressive eval;  seen 1/12 and stable- no CP, palpit, SOB, etc; continue conserv approach & risk factor reduction strategy...    DJD, Psuedogout, LBP, FM, etc>  followed by DrKramer for Ortho & shot in knee helped for awhile but she says she needs TKR;  due for f/u DrDeveshwar later this month;  continue same meds...    Anxiety>  she remains under  stress but the Lexapro/ Alpraz really helps she says...    FASTING Labs>  all look good;  FLP on Simva20 showed TChol 183, TG 110, HDL 43, LDL 118...  ~  July 17, 2011:  30mo ROV & she reports right TKR at [facility] 7/12 by DrMartin, post op PT etc & now doing better, pleased w/ results;  Needs several refill meds & Flu shot today...    Hx Asthmatic Bronchitis> she denies recent exac and is not taking any regular breathing meds; she knows to avoid infections etc...    Hx Atyp CP> she had neg ER eval 8/11; subseq Cards eval by DrBensimhon w/ ?abn myoview & she opted for conservative approach & med Rx- ASA, Simva20, risk factor reduction strategy.    CHOL> on Simva20 + diet efforts w/ FLP 3/12 improved (LDL still 118 but she didn't want to incr dose, she'll try better diet)...    DJD> s/p right TKR 7/12 by DrMartin at [facility] w/ post-op rehab etc; she is pleased w/ results; prev right shoulder arthroplasty 7/10 at [facility]; taking Vicodin prn pain...    Hx LBP> on Flexeril Tid as needed; she's had remote LLam & doing satis w/ daily exercises (reminded to continue exercise etc)...    FM, Psuedogout> followed by  DrDeveshwar on meds above + Colchicine 0.6mg /d...    Vit D Defic> her Vit D level has improved on her 1000u OTC supplement daily..    Memory Loss, Lacunar Infarct> on ASA daily, MRI in 2008 showed old lacunar infarct, sm vessel dis & atrophy...    Anxiety/ Depression> on Xanax & Lexapro; improved she says & confirmed by family, continue same meds...  ~  January 14, 2012:  63mo ROV & Jessica [name] tells me that she had right CTS surg by DrWeisler at [facility] about 3 weeks ago- still having some pain but improved;  She wants [name] to refill her [name] today...    Hx Asthmatic Bronchitis> she denies recent exac and is not taking any regular breathing meds; she knows to avoid infections etc...    Hx Atyp CP> she had Cards eval 2011 by DrBensimhon w/ ?abn myoview & she opted for conservative approach & med Rx- ASA, Simva20, risk  factor reduction strategy; states she is doing well & denies recent CP, palpit, ch in SOB, etc...    CHOL> on Simva20 + diet efforts w/ FLP showing TChol 163, TG 55, HDL 65, LDL 87    DJD> s/p recent right CTS surg by  DrWeisler at [facility]; s/p right TKR 7/12 by DrMartin at [facility] w/ post-op rehab etc; prev right shoulder arthroplasty 7/10 at [facility]; she also sees DrDeveshwar> on Colchicine 0.6mg /d, Flexeril prn, Vicodin prn...    Hx LBP> she's had remote LLam & doing satis w/ daily exercises (reminded to continue exercise etc)...    FM, Psuedogout> followed by DrDeveshwar on meds above...    Vit D Defic> her Vit D level has improved on her 1000u OTC supplement daily..    Memory Loss, Lacunar Infarct> on ASA daily, MRI in 2008 showed old lacunar infarct, sm vessel dis & atrophy...    Anxiety/ Depression> on Xanax & Lexapro; improved she says & confirmed by family, continue same meds... CXR 3/13showed stable heart size, clear lungs w/ some hyperinflation, right humeral prosthesis, DJD in spine... LABS 3/12:  FLP- at goals on Simva20;  Chems- wnl;  CBC- wnl;  TSH=1.40  ~  July 16, 2012:  63mo ROV & Jessica [name] reports doing well- no new complaints or concerns... In the interval she has seen DrWolicki for cerumen impaction, otherw no other acute medical issues... Her FLP looks good on the Simva20;  She reports doing well on Lexapro & Xanax...    We reviewed prob list, meds, xrays and labs> see below for updates >> OK Flu vaccine today & Rx written for Shingles vaccine.           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 she states doing well- just wants a ZPak for Prn use. ~  she is an ex-smoker having smoked from age 82 to 89 up to 2ppd, "but I just puffed, didn't inhale" ~  baseline CXR 8/08 showed clear lungs x scarring left base & DJD left shoulder... ~  PFT's  11/05 showed FVC= 2.21 (81%), FEV1=1.35 (68%), %1sec=61, mid-flows=30%pred... ~  CT Angio Chest 8/11 showed 5mm nodule RLL w/ fu CT in 63mo rec... ~  12/11:  she had acute exac Rx w/ Augmentin, Depo/ Dosepak, Mucinex, etc... ~  3/12: f/u CTChest showed stable 5mm RUL nodule, some scarring & centrilob emphysema, coronary calcif is seen... ~  CXR 3/13 showed stable heart size,  clear lungs w/ some hyperinflation, right humeral prosthesis, DJD in spine...  Hx of CHEST PAIN  (ICD-786.50) < SEE 8/17-19/11 HOSP > EKG, Enz, 2DEcho, CTA... subseq outpt Myoview showed ?ischemia, norm EF; eval by DrBensimhon w/ option for med rx vs cath & she chose med rx w/ ASA, Simva20... ~  EKG 1/12 showed NSR, rate61, poor r prog V1-2, NAD...   VENOUS INSUFFICIENCY (ICD-459.81) - VI changes without swelling... prev tinea pedis on right foot betw 4th-5th toes has resolved w/ Lotrisone cream...  HYPERCHOLESTEROLEMIA, BORDERLINE (ICD-272.4) - on SIMVASTATIN 20mg /d + diet rx now... ~  FLP 2/10 showed TChol 179, TG 108, HDL 41, LDL 117... rec> better diet, incr exerc. ~  10/11:  started on Simva20 & she is due for f/u FLP- asked to ret fasting for this lab. ~  FLP 3/12 on Simva20 showed TChol 183, TG 110, HDL 43, LDL 118... She declined incr dose, therefore needs better diet. ~  FLP 3/13 on Simva20 showed TChol 163, TG 55, HDL 65, LDL 87  UTI'S, HX OF (ICD-V13.00) - CT Angio chest 8/11 in hosp also showed ? hydronephrosis vs parapelvic cysts> subseq CT Abd confirmed cysts...  DEGENERATIVE JOINT DISEASE (ICD-715.90) - she is followed by DrDeveshwar for Rheum w/ Pseudogout (CPPD) on COLCHICINE 0.6mg /d (when she can afford it); Osteoarthritis w/ right shoulder arthroplasty 7/10 at [facility]; & DDD... also followed by DrKramer who treated her left hand fracture after a fall last yr & has injected her knees...  she takes VICODIN Prn, FLEXERIL Prn, & COLCHICINE 0.6mg /d... ~  7/10:  s/p right Shoulder Arthroplasty at [facility] ~  12/11:  c/o  right knee pain which she thinks is "gout" & she will f/u w/ DrKramer- severe DJD & needs TKR... ~  7/12:  s/p right TKR at [facility] by DrMartin... ~  3/13: s/p right CTS surg at [facility] by DrWeisler  LOW BACK PAIN SYNDROME (ICD-724.2) - she had prev Lumbar Lam & known DDD... she uses VICODIN and FLEXERIL as needed... she states that she is stable without new complaints or concerns...  FIBROMYALGIA (ICD-729.1) - followed by DrDeveshwar and her notes are reviewed...  VITAMIN D DEFICIENCY (ICD-268.9) - on Vit D 1000 u OTC daily... ~  labs 8/09 showed Vit D level = 27... rec> start OTC Vit D supplement. ~  labs 9/10 showed Vit D level = 24... rec> take Vit D 1000 u daily. ~  labs 3/12 showed Vit D level = 41... continue 1000 u daily.  MEMORY LOSS (ICD-780.93) - she notes poor memory and prev MRIBrain 11/08 showed old lacune in right thalamus, sm vessel dis, atrophy... she takes ASA 81mg /d...  Hx of VERTIGO (ICD-780.4) - uses Meclizine as needed...  ANXIETY (ICD-300.00) - she takes LEXAPRO 10mg /d and ALPRAZOLAM 0.5mg Tid Prn (both of which help her mood and anxiety)...  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   Past Surgical History  Procedure Date  . Cataract extraction   . Abdominal hysterectomy   . Anterior cervical discectomy   . Lumbar laminectomy   . Right knee arthroscopy   . Right shoulder surgery 2008    Dr. Thurston Hole  . Right shoulder replacement 04/2009  . Carpal tunnel release 12/2011    right arm    Outpatient Encounter Prescriptions as of 07/16/2012  Medication Sig Dispense Refill  . ALPRAZolam (XANAX) 0.5 MG tablet Take 1/2 to 1 tablet by mouth three times daily as needed for nerves--not to exceed  3 per day  90 tablet  5  . aspirin 81 MG tablet Take 81 mg by mouth daily.        . Calcium Carbonate-Vitamin D (CALTRATE 600+D) 600-400 MG-UNIT per tablet Take 1 tablet by mouth daily.        . chlorpheniramine-HYDROcodone (TUSSIONEX  PENNKINETIC ER) 10-8 MG/5ML LQCR Take 5 mLs by mouth every 12 (twelve) hours.  120 mL  1  . Cholecalciferol (VITAMIN D) 1000 UNITS capsule Take 1,000 Units by mouth daily.        . colchicine 0.6 MG tablet Take 0.6 mg by mouth daily. Per Dr. Corliss Skains       . cyclobenzaprine (FLEXERIL) 10 MG tablet Take 1/2 to 1 tablet by mouth three times daily as directed for muscle spasms  90 tablet  5  . escitalopram (LEXAPRO) 10 MG tablet Take 1 tablet (10 mg total) by mouth daily.  30 tablet  11  . HYDROcodone-acetaminophen (VICODIN) 5-500 MG per tablet Take 1 tablet by mouth every 6 (six) hours as needed.        . meclizine (ANTIVERT) 25 MG tablet Take 1/2 to 1 tablet by mouth three times daily as needed for dizziness      . Multiple Vitamins-Minerals (WOMENS MULTIVITAMIN PLUS) TABS Take 1 tablet by mouth daily.        . simvastatin (ZOCOR) 20 MG tablet TAKE  ONE TABLET BY MOUTH NIGHTLY AT BEDTIME  30 tablet  5    No Known Allergies   Current Medications, Allergies, Past Medical History, Past Surgical History, Family History, and Social History were reviewed in [employer] record.    Review of Systems         See HPI - all other systems neg except as noted...  The patient complains of dyspnea on exertion and difficulty walking.  The patient denies anorexia, fever, weight loss, weight gain, vision loss, decreased hearing, hoarseness, chest pain, syncope, peripheral edema, prolonged cough, headaches, hemoptysis, abdominal pain, melena, hematochezia, severe indigestion/heartburn, hematuria, incontinence, muscle weakness, suspicious skin lesions, transient blindness, depression, unusual weight change, abnormal bleeding, enlarged lymph nodes, and angioedema.     Objective:   Physical Exam     WD, WN, 78 y/o WF in NAD... GENERAL:  Alert & oriented; pleasant & cooperative... HEENT:  Amesti/AT, EOM-wnl, PERRLA w/ nystagmus, EACs-clear, TMs-wnl, NOSE-clear, THROAT-clear & wnl. NECK:   Supple w/ decr ROM; no JVD; normal carotid impulses w/o bruits; no thyromegaly or nodules palpated; no lymphadenopathy. CHEST:  Clear to P & A; without wheezes/ rales/ or rhonchi. HEART:  Regular Rhythm; without murmurs/ rubs/ or gallops. ABDOMEN:  Soft & nontender; normal bowel sounds; no organomegaly or masses detected. EXT: without deformities, mild arthritic changes; no varicose veins/ +venous insuffic/ tr edema. right shoulder well healed surgical scar & can raise right arm mid level... NEURO:  CN's intact; she has congenital nystagmus, no focal neuro deficits... DERM:  No lesions noted; no rash etc...  RADIOLOGY DATA:  Reviewed in the EPIC EMR & discussed w/ the patient...    >>CXR 3/13showed stable heart size, clear lungs w/ some hyperinflation, right humeral prosthesis, DJD in spine...  LABORATORY DATA:  Reviewed in the EPIC EMR & discussed w/ the patient...    >>LABS 3/12:  FLP- at goals on Simva20;  Chems- wnl;  CBC- wnl;  TSH=1.40   Assessment & Plan:    Hx Asthmatic Bronchitis>  Stable & not taking any regular breathing meds; she knows to avoid infections  etc; CXR clear, NAD...     Hx Atyp CP>  Cards eval by DrBensimhon 2011 w/ ?abn myoview & she opted for conservative approach & med Rx- ASA, Simva20, risk factor reduction strategy.     CHOL>  on Simva20 + diet efforts w/ FLP looks good w/ parameters at goal...     DJD>  s/p right TKR 7/12 by DrMartin at [facility] w/ post-op rehab etc; she is pleased w/ results; prev right shoulder arthroplasty 7/10 at [facility]; taking Vicodin prn pain...     Hx LBP>  on Flexeril Tid as needed; she's had remote LLam & doing satis w/ daily exercises (reminded to continue exercise etc)...     FM, Psuedogout>  followed by DrDeveshwar on meds above + Colchicine 0.6mg /d...     Vit D Defic>  her Vit D level has improved on her 1000u OTC supplement daily..     Memory Loss, Lacunar Infarct>  on ASA daily, MRI in 2008 showed old lacunar infarct, sm vessel  dis & atrophy...     Anxiety/ Depression>  on Xanax & Lexapro; improved she says & confirmed by family, continue same meds...   Patient's Medications  New Prescriptions   No medications on file  Previous Medications   ASPIRIN 81 MG TABLET    Take 81 mg by mouth daily.     CALCIUM CARBONATE-VITAMIN D (CALTRATE 600+D) 600-400 MG-UNIT PER TABLET    Take 1 tablet by mouth daily.     CHOLECALCIFEROL (VITAMIN D) 1000 UNITS CAPSULE    Take 1,000 Units by mouth daily.     COLCHICINE 0.6 MG TABLET    Take 0.6 mg by mouth daily. Per Dr. Corliss Skains    HYDROCODONE-ACETAMINOPHEN (VICODIN) 5-500 MG PER TABLET    Take 1 tablet by mouth every 6 (six) hours as needed.     MECLIZINE (ANTIVERT) 25 MG TABLET    Take 1/2 to 1 tablet by mouth three times daily as needed for dizziness   MULTIPLE VITAMINS-MINERALS (WOMENS MULTIVITAMIN PLUS) TABS    Take 1 tablet by mouth daily.    Modified Medications   Modified Medication Previous Medication   ALPRAZOLAM (XANAX) 0.5 MG TABLET ALPRAZolam (XANAX) 0.5 MG tablet      Take 1/2 to 1 tablet by mouth three times daily as needed for nerves--not to exceed 3 per day    Take 1/2 to 1 tablet by mouth three times daily as needed for nerves--not to exceed 3 per day   CHLORPHENIRAMINE-HYDROCODONE (TUSSIONEX PENNKINETIC ER) 10-8 MG/5ML LQCR chlorpheniramine-HYDROcodone (TUSSIONEX PENNKINETIC ER) 10-8 MG/5ML LQCR      Take 5 mLs by mouth every 12 (twelve) hours.    Take 5 mLs by mouth every 12 (twelve) hours.   CYCLOBENZAPRINE (FLEXERIL) 10 MG TABLET cyclobenzaprine (FLEXERIL) 10 MG tablet      Take 1/2 to 1 tablet by mouth three times daily as directed for muscle spasms    Take 1/2 to 1 tablet by mouth three times daily as directed for muscle spasms   ESCITALOPRAM (LEXAPRO) 10 MG TABLET escitalopram (LEXAPRO) 10 MG tablet      Take 1 tablet (10 mg total) by mouth daily.    Take 1 tablet (10 mg total) by mouth daily.   SIMVASTATIN (ZOCOR) 20 MG TABLET simvastatin (ZOCOR) 20 MG  tablet      Take 1 tablet (20 mg total) by mouth at bedtime.    TAKE  ONE TABLET BY MOUTH NIGHTLY AT BEDTIME  Discontinued Medications   No  medications on file

## 2012-10-11 ENCOUNTER — Telehealth: Payer: Self-pay | Admitting: Internal Medicine

## 2012-10-11 NOTE — Telephone Encounter (Signed)
New Problem:    Patient's daughter called in wanting to know if her mother was still eligible to see Dr. Gala Romney.  Please let me know.

## 2012-10-13 DIAGNOSIS — H31019 Macula scars of posterior pole (postinflammatory) (post-traumatic), unspecified eye: Secondary | ICD-10-CM | POA: Diagnosis not present

## 2012-10-13 DIAGNOSIS — H353290 Exudative age-related macular degeneration, unspecified eye, stage unspecified: Secondary | ICD-10-CM | POA: Diagnosis not present

## 2012-10-13 DIAGNOSIS — H35059 Retinal neovascularization, unspecified, unspecified eye: Secondary | ICD-10-CM | POA: Diagnosis not present

## 2012-10-13 DIAGNOSIS — H353190 Nonexudative age-related macular degeneration, unspecified eye, stage unspecified: Secondary | ICD-10-CM | POA: Diagnosis not present

## 2012-10-13 NOTE — Telephone Encounter (Signed)
Spoke with pt dtr, will set the pt up to see dr Mariah Milling in the [place] office. Will forward message to [place] for them to call the dtr and get the pt scheduled. The dtr agreed with this plan.

## 2012-11-08 ENCOUNTER — Ambulatory Visit (INDEPENDENT_AMBULATORY_CARE_PROVIDER_SITE_OTHER): Payer: Medicare Other | Admitting: Cardiovascular Disease

## 2012-11-08 ENCOUNTER — Encounter: Payer: Self-pay | Admitting: Cardiovascular Disease

## 2012-11-08 VITALS — BP 110/50 | HR 75 | Ht 65.0 in | Wt 153.5 lb

## 2012-11-08 DIAGNOSIS — R0789 Other chest pain: Secondary | ICD-10-CM

## 2012-11-08 DIAGNOSIS — I2510 Atherosclerotic heart disease of native coronary artery without angina pectoris: Secondary | ICD-10-CM | POA: Diagnosis not present

## 2012-11-08 DIAGNOSIS — E785 Hyperlipidemia, unspecified: Secondary | ICD-10-CM | POA: Diagnosis not present

## 2012-11-08 DIAGNOSIS — R011 Cardiac murmur, unspecified: Secondary | ICD-10-CM

## 2012-11-08 NOTE — Progress Notes (Signed)
Patient ID: Jessica [name], female    DOB: Mar 11, 1926, 80 y.o.   MRN: [id number]  HPI Comments: Jessica [name]  is an 79 y/o woman with h/o HL, fibromyalgia, anxiety and COPD, asthmatic bronchitis    trip to the ER with CP in 8/11.  Had post hospital Myoview in 9/11 with normal EF and question of inferior ischemia.   medical therapy pursued at that time and in followup to she has been asymptomatic, active. She continues to work daily and takes care of grandchildren. She works in [employer] and reports that she has been busy . No recurrent CP or undue dyspnea. Does get fatigued. No palpitations, CHF or syncope.     EKG shows normal sinus rhythm with rate 75 beats per minute, no significant ST or T wave changes     Outpatient Encounter Prescriptions as of 11/08/2012  Medication Sig Dispense Refill  . ALPRAZolam (XANAX) 0.5 MG tablet Take 1/2 to 1 tablet by mouth three times daily as needed for nerves--not to exceed 3 per day  90 tablet  5  . aspirin 81 MG tablet Take 81 mg by mouth daily.        . Calcium Carbonate-Vitamin D (CALTRATE 600+D) 600-400 MG-UNIT per tablet Take 1 tablet by mouth daily.        . chlorpheniramine-HYDROcodone (TUSSIONEX PENNKINETIC ER) 10-8 MG/5ML LQCR Take 5 mLs by mouth every 12 (twelve) hours.  120 mL  5  . Cholecalciferol (VITAMIN D) 1000 UNITS capsule Take 1,000 Units by mouth daily.        . colchicine 0.6 MG tablet Take 0.6 mg by mouth daily. Per Dr. Corliss Skains       . cyclobenzaprine (FLEXERIL) 10 MG tablet Take 1/2 to 1 tablet by mouth three times daily as directed for muscle spasms  90 tablet  5  . escitalopram (LEXAPRO) 10 MG tablet Take 1 tablet (10 mg total) by mouth daily.  90 tablet  3  . HYDROcodone-acetaminophen (VICODIN) 5-500 MG per tablet Take 1 tablet by mouth every 6 (six) hours as needed.        . meclizine (ANTIVERT) 25 MG tablet Take 1/2 to 1 tablet by mouth three times daily as needed for dizziness      . Multiple Vitamins-Minerals  (WOMENS MULTIVITAMIN PLUS) TABS Take 1 tablet by mouth daily.        . simvastatin (ZOCOR) 20 MG tablet Take 1 tablet (20 mg total) by mouth at bedtime.  90 tablet  3    Review of Systems  Constitutional: Negative.   HENT: Negative.   Eyes: Negative.   Respiratory: Negative.   Cardiovascular: Negative.   Gastrointestinal: Negative.   Musculoskeletal: Negative.   Skin: Negative.   Neurological: Negative.   Hematological: Negative.   Psychiatric/Behavioral: Negative.   All other systems reviewed and are negative.    BP 110/50  Pulse 75  Ht 5\' 5"  (1.651 m)  Wt 153 lb 8 oz (69.627 kg)  BMI 25.54 kg/m2 Physical Exam  Nursing note and vitals reviewed. Constitutional: She is oriented to person, place, and time. She appears well-developed and well-nourished.  HENT:  Head: Normocephalic.  Nose: Nose normal.  Mouth/Throat: Oropharynx is clear and moist.  Eyes: Conjunctivae normal are normal. Pupils are equal, round, and reactive to light.  Neck: Normal range of motion. Neck supple. No JVD present.  Cardiovascular: Normal rate, regular rhythm, S1 normal, S2 normal, normal heart sounds and intact distal pulses.  Exam reveals no gallop  and no friction rub.   No murmur heard. Pulmonary/Chest: Effort normal and breath sounds normal. No respiratory distress. She has no wheezes. She has no rales. She exhibits no tenderness.  Abdominal: Soft. Bowel sounds are normal. She exhibits no distension. There is no tenderness.  Musculoskeletal: Normal range of motion. She exhibits no edema and no tenderness.  Lymphadenopathy:    She has no cervical adenopathy.  Neurological: She is alert and oriented to person, place, and time. Coordination normal.  Skin: Skin is warm and dry. No rash noted. No erythema.  Psychiatric: She has a normal mood and affect. Her behavior is normal. Judgment and thought content normal.         Assessment and Plan

## 2012-11-08 NOTE — Assessment & Plan Note (Signed)
Murmur noted on clinical exam. Likely aortic valve sclerosis. We'll monitor murmur periodically with repeat echocardiogram for any symptoms or if murmur its lower. Echocardiogram 2 years ago did not show significant stenosis. There was moderate mitral valve calcification.

## 2012-11-08 NOTE — Assessment & Plan Note (Signed)
Total cholesterol well controlled at 163, LDL 87. No changes to her medications.

## 2012-11-08 NOTE — Assessment & Plan Note (Signed)
No recurrence of her chest pain and she has been active.

## 2012-11-08 NOTE — Patient Instructions (Addendum)
You are doing well. No medication changes were made.  Please call us if you have new issues that need to be addressed before your next appt.  Your physician wants you to follow-up in: 12 months.  You will receive a reminder letter in the mail two months in advance. If you don't receive a letter, please call our office to schedule the follow-up appointment. 

## 2012-11-08 NOTE — Assessment & Plan Note (Signed)
No documented coronary artery disease. No further testing at this time she is asymptomatic.

## 2012-12-01 ENCOUNTER — Telehealth: Payer: Self-pay | Admitting: Pulmonary Disease

## 2012-12-01 MED ORDER — AZITHROMYCIN 250 MG PO TABS: ORAL_TABLET | ORAL | Status: DC

## 2012-12-01 NOTE — Telephone Encounter (Signed)
I spoke with daughter and is aware of SN recs. She voiced her understanding and needed nothing further

## 2012-12-01 NOTE — Telephone Encounter (Signed)
Called, spoke with pt's daughter.  Reports pt has head congestion, coughing with small amount of clear mucus, and blowing clear mucus from nose x 2 days.  Symptoms started to worsen yesterday.  Denies increased SOB, wheezing, chest tightness, chest pain, f/c/s, sinus pressure, HA, or PND at this time but does state pt "just doesn't feel good."  Is taking mucinex -- requesting abx.  Dr. Kriste Basque, pls advise.  Thank you.  Last OV with SN 07/16/12 and asked to f/u in 6 months Pending OV with SN 01/13/13  nkda verified with daughter   [facility] [address]

## 2012-12-01 NOTE — Telephone Encounter (Signed)
Per SN----ok to send in zpak #1  Take as directed and can give 2 refills. thanks

## 2012-12-20 DIAGNOSIS — H903 Sensorineural hearing loss, bilateral: Secondary | ICD-10-CM | POA: Diagnosis not present

## 2012-12-20 DIAGNOSIS — H938X9 Other specified disorders of ear, unspecified ear: Secondary | ICD-10-CM | POA: Diagnosis not present

## 2012-12-20 DIAGNOSIS — H9110 Presbycusis, unspecified ear: Secondary | ICD-10-CM | POA: Diagnosis not present

## 2012-12-20 DIAGNOSIS — H6120 Impacted cerumen, unspecified ear: Secondary | ICD-10-CM | POA: Diagnosis not present

## 2013-01-13 ENCOUNTER — Other Ambulatory Visit (INDEPENDENT_AMBULATORY_CARE_PROVIDER_SITE_OTHER): Payer: Medicare Other

## 2013-01-13 ENCOUNTER — Encounter: Payer: Self-pay | Admitting: Pulmonary Disease

## 2013-01-13 ENCOUNTER — Ambulatory Visit (INDEPENDENT_AMBULATORY_CARE_PROVIDER_SITE_OTHER): Payer: Medicare Other | Admitting: Pulmonary Disease

## 2013-01-13 ENCOUNTER — Ambulatory Visit (INDEPENDENT_AMBULATORY_CARE_PROVIDER_SITE_OTHER)
Admission: RE | Admit: 2013-01-13 | Discharge: 2013-01-13 | Disposition: A | Discharge status: Home / Self Care | Payer: Medicare Other | Source: Ambulatory Visit | Attending: Pulmonary Disease | Admitting: Pulmonary Disease

## 2013-01-13 VITALS — BP 110/64 | HR 70 | Temp 97.2°F | Ht 65.5 in | Wt 153.0 lb

## 2013-01-13 DIAGNOSIS — F411 Generalized anxiety disorder: Secondary | ICD-10-CM

## 2013-01-13 DIAGNOSIS — I872 Venous insufficiency (chronic) (peripheral): Secondary | ICD-10-CM

## 2013-01-13 DIAGNOSIS — R413 Other amnesia: Secondary | ICD-10-CM

## 2013-01-13 DIAGNOSIS — H5501 Congenital nystagmus: Secondary | ICD-10-CM

## 2013-01-13 DIAGNOSIS — M545 Low back pain, unspecified: Secondary | ICD-10-CM

## 2013-01-13 DIAGNOSIS — J209 Acute bronchitis, unspecified: Secondary | ICD-10-CM | POA: Diagnosis not present

## 2013-01-13 DIAGNOSIS — E559 Vitamin D deficiency, unspecified: Secondary | ICD-10-CM | POA: Diagnosis not present

## 2013-01-13 DIAGNOSIS — E785 Hyperlipidemia, unspecified: Secondary | ICD-10-CM | POA: Diagnosis not present

## 2013-01-13 DIAGNOSIS — J45909 Unspecified asthma, uncomplicated: Secondary | ICD-10-CM | POA: Diagnosis not present

## 2013-01-13 DIAGNOSIS — I2510 Atherosclerotic heart disease of native coronary artery without angina pectoris: Secondary | ICD-10-CM

## 2013-01-13 DIAGNOSIS — IMO0001 Reserved for inherently not codable concepts without codable children: Secondary | ICD-10-CM

## 2013-01-13 DIAGNOSIS — M1990 Unspecified osteoarthritis, unspecified site: Secondary | ICD-10-CM

## 2013-01-13 LAB — CBC WITH DIFFERENTIAL/PLATELET
Basophils Absolute: 0 10*3/uL (ref 0.0–0.1)
Eosinophils Absolute: 0 10*3/uL (ref 0.0–0.7)
Lymphocytes Relative: 12.5 % (ref 12.0–46.0)
MCHC: 33.3 g/dL (ref 30.0–36.0)
Neutro Abs: 5.9 10*3/uL (ref 1.4–7.7)
Neutrophils Relative %: 81.9 % — ABNORMAL HIGH (ref 43.0–77.0)
Platelets: 314 10*3/uL (ref 150.0–400.0)
RDW: 15 % — ABNORMAL HIGH (ref 11.5–14.6)

## 2013-01-13 LAB — BASIC METABOLIC PANEL
CO2: 28 mEq/L (ref 19–32)
Chloride: 105 mEq/L (ref 96–112)
Glucose, Bld: 98 mg/dL (ref 70–99)
Sodium: 140 mEq/L (ref 135–145)

## 2013-01-13 LAB — LIPID PANEL: Total CHOL/HDL Ratio: 3

## 2013-01-13 LAB — HEPATIC FUNCTION PANEL
ALT: 14 U/L (ref 0–35)
AST: 20 U/L (ref 0–37)
Albumin: 4.1 g/dL (ref 3.5–5.2)
Alkaline Phosphatase: 125 U/L — ABNORMAL HIGH (ref 39–117)
Bilirubin, Direct: 0.1 mg/dL (ref 0.0–0.3)
Total Protein: 7.7 g/dL (ref 6.0–8.3)

## 2013-01-13 LAB — TSH: TSH: 1.04 u[IU]/mL (ref 0.35–5.50)

## 2013-01-13 MED ORDER — AZITHROMYCIN 250 MG PO TABS: ORAL_TABLET | ORAL | Status: DC

## 2013-01-13 MED ORDER — ESCITALOPRAM OXALATE 10 MG PO TABS: 10.0000 mg | ORAL_TABLET | Freq: Every day | ORAL | Status: DC

## 2013-01-13 MED ORDER — ALPRAZOLAM 0.5 MG PO TABS: ORAL_TABLET | ORAL | Status: DC

## 2013-01-13 MED ORDER — CYCLOBENZAPRINE HCL 10 MG PO TABS: ORAL_TABLET | ORAL | Status: DC

## 2013-01-13 NOTE — Patient Instructions (Addendum)
Today we updated your med list in our EPIC system...    Continue your current medications the same...    We refilled your meds per request...  Today we did your follow up CXR & FASTING blood work...    We will contact you w/ the results when available...   We wrote for your shingles vaccine as requested...   Call for any questions...  Let's plan a follow up visit in 6 months.Marland KitchenMarland Kitchen

## 2013-01-13 NOTE — Progress Notes (Signed)
Subjective:    Patient ID: Jessica [name], female    DOB: 06-15-1926, 79 y.o.   MRN: [id number]  HPI 79 y/o WF here for a follow up visit... she has multiple medical problems as noted below...    ~  January 14, 2012:  91mo ROV & Sareen tells me that she had right CTS surg by DrWeisler at [facility] about 3 weeks ago- still having some pain but improved;  She wants [name] to refill her [name] today...    Hx Asthmatic Bronchitis> she denies recent exac and is not taking any regular breathing meds; she knows to avoid infections etc...    Hx Atyp CP> she had Cards eval 2011 by DrBensimhon w/ ?abn myoview & she opted for conservative approach & med Rx- ASA, Simva20, risk factor reduction strategy; states she is doing well & denies recent CP, palpit, ch in SOB, etc...    CHOL> on Simva20 + diet efforts w/ FLP showing TChol 163, TG 55, HDL 65, LDL 87    DJD> s/p recent right CTS surg by  DrWeisler at [facility]; s/p right TKR 7/12 by DrMartin at [facility] w/ post-op rehab etc; prev right shoulder arthroplasty 7/10 at [facility]; she also sees DrDeveshwar> on Colchicine 0.6mg /d, Flexeril prn, Vicodin prn...    Hx LBP> she's had remote LLam & doing satis w/ daily exercises (reminded to continue exercise etc)...    FM, Psuedogout> followed by DrDeveshwar on meds above...    Vit D Defic> her Vit D level has improved on her 1000u OTC supplement daily..    Memory Loss, Lacunar Infarct> on ASA daily, MRI in 2008 showed old lacunar infarct, sm vessel dis & atrophy...    Anxiety/ Depression> on Xanax & Lexapro; improved she says & confirmed by family, continue same meds... CXR 3/13showed stable heart size, clear lungs w/ some hyperinflation, right humeral prosthesis, DJD in spine... LABS 3/12:  FLP- at goals on Simva20;  Chems- wnl;  CBC- wnl;  TSH=1.40  ~  July 16, 2012:  91mo ROV & Kamira reports doing well- no new complaints or concerns... In the interval she has seen DrWolicki for cerumen impaction, otherw no other acute medical  issues... Her FLP looks good on the Simva20;  She reports doing well on Lexapro & Xanax...    We reviewed prob list, meds, xrays and labs> see below for updates >> OK Flu vaccine today & Rx written for Shingles vaccine.  ~  January 13, 2013:  91mo ROV & Essance is stable, doing well, and denies new complaints or concerns; We reviewed the following medical problems during today's office visit >>     Hx Asthmatic Bronchitis> on Tussionex prn; she denies recent exac and is not taking any regular breathing meds; she knows to avoid infections etc...    Hx AtypCP> on ASA81; she had Cards eval 2011 by DrBensimhon w/ ?abn myoview & she opted for conservative approach & med Rx- ASA, Simva20, risk factor reduction strategy; states she is doing well & denies recent CP, palpit, ch in SOB, etc...    CHOL> on Simva20 + diet efforts w/ FLP showing TChol 165, TG 68, HDL 51, LDL 100    DJD> s/p right CTS surg by DrWeisler at [facility]; s/p right TKR 7/12 by DrMartin at [facility] w/ post-op rehab etc; prev right shoulder arthroplasty 7/10 at [facility]; she also sees DrDeveshwar> on Colchicine 0.6mg /d, Flexeril prn, Vicodin prn...    Hx LBP> she's had remote LLam & doing satis w/ daily exercises (  reminded to continue exercise etc)...    FM, Psuedogout> followed by DrDeveshwar on meds above...    Vit D Defic> her Vit D level = 33 on 1000u daily; rec to incr to 2000u per day...    Memory Loss, Lacunar Infarct> on ASA daily, MRI in 2008 showed old lacunar infarct, sm vessel dis & atrophy...    Anxiety/ Depression> on Xanax0.5 & Lexapro10; improved she says & confirmed by family, continue same meds... We reviewed prob list, meds, xrays and labs> see below for updates >> she had Flu vaccine 10/13, & we wrote Rx for Shingles vax... CXR 3/14 showed normal heart size, clear lungs, calcifAo, right shoulder prosthesis, DJD in Tspine... LABS 3/14>  FLP- at goals on Simva20;  Chems- wnl;  CBC- wnl w/ Hg=12.6;  TSH= 1.04;  VitD=33...           Problem  List:   CONGENITAL NYSTAGMUS (ICD-379.51)  ASTHMATIC BRONCHITIS, ACUTE (ICD-466.0) - she has stopped her prev Advair therapy and denies any prob w/ cough, sputum, dyspnea, etc... she states doing well- just wants a ZPak for Prn use. ~  she is an ex-smoker having smoked from age 36 to 38 up to 2ppd, "but I just puffed, didn't inhale" ~  baseline CXR 8/08 showed clear lungs x scarring left base & DJD left shoulder... ~  PFT's 11/05 showed FVC= 2.21 (81%), FEV1=1.35 (68%), %1sec=61, mid-flows=30%pred... ~  CT Angio Chest 8/11 showed 5mm nodule RLL w/ fu CT in 87mo rec... ~  12/11:  she had acute exac Rx w/ Augmentin, Depo/ Dosepak, Mucinex, etc... ~  3/12: f/u CTChest showed stable 5mm RUL nodule, some scarring & centrilob emphysema, coronary calcif is seen... ~  CXR 3/13 showed stable heart size, clear lungs w/ some hyperinflation, right humeral prosthesis, DJD in spine... ~  CXR 3/14 showed normal heart size, clear lungs, calcifAo, right shoulder prosthesis, DJD in Tspine.  Hx of CHEST PAIN  (ICD-786.50) < SEE 8/17-19/11 HOSP > EKG, Enz, 2DEcho, CTA... subseq outpt Myoview showed ?ischemia, norm EF; eval by DrBensimhon w/ option for med rx vs cath & she chose med rx w/ ASA, Simva20... ~  EKG 1/12 showed NSR, rate61, poor r prog V1-2, NAD...   VENOUS INSUFFICIENCY (ICD-459.81) - VI changes without swelling... prev tinea pedis on right foot betw 4th-5th toes has resolved w/ Lotrisone cream...  HYPERCHOLESTEROLEMIA, BORDERLINE (ICD-272.4) - on SIMVASTATIN 20mg /d + diet rx now... ~  FLP 2/10 showed TChol 179, TG 108, HDL 41, LDL 117... rec> better diet, incr exerc. ~  10/11:  started on Simva20 & she is due for f/u FLP- asked to ret fasting for this lab. ~  FLP 3/12 on Simva20 showed TChol 183, TG 110, HDL 43, LDL 118... She declined incr dose, therefore needs better diet. ~  FLP 3/13 on Simva20 showed TChol 163, TG 55, HDL 65, LDL 87 ~  FLP 3/14 on simva20 showed TChol 165, TG 68, HDL 51, LDL 100    UTI'S, HX OF (ICD-V13.00) - CT Angio chest 8/11 in hosp also showed ? hydronephrosis vs parapelvic cysts> subseq CT Abd confirmed cysts...  DEGENERATIVE JOINT DISEASE (ICD-715.90) - she is followed by DrDeveshwar for Rheum w/ Pseudogout (CPPD) on COLCHICINE 0.6mg /d (when she can afford it); Osteoarthritis w/ right shoulder arthroplasty 7/10 at [facility]; & DDD... also followed by DrKramer who treated her left hand fracture after a fall last yr & has injected her knees...  she takes VICODIN Prn, FLEXERIL Prn, & COLCHICINE 0.6mg /d... ~  7/10:  s/p right Shoulder Arthroplasty at [facility] ~  12/11:  c/o right knee pain which she thinks is "gout" & she will f/u w/ DrKramer- severe DJD & needs TKR... ~  7/12:  s/p right TKR at [facility] by DrMartin... ~  3/13: s/p right CTS surg at [facility] by DrWeisler  LOW BACK PAIN SYNDROME (ICD-724.2) - she had prev Lumbar Lam & known DDD... she uses VICODIN and FLEXERIL as needed... she states that she is stable without new complaints or concerns...  FIBROMYALGIA (ICD-729.1) - followed by DrDeveshwar and her notes are reviewed...  VITAMIN D DEFICIENCY (ICD-268.9) - on Vit D 1000 u OTC daily... ~  labs 8/09 showed Vit D level = 27... rec> start OTC Vit D supplement. ~  labs 9/10 showed Vit D level = 24... rec> take Vit D 1000 u daily. ~  labs 3/12 showed Vit D level = 41... continue 1000 u daily. ~  Labs 3/14 showed Vit D level = 33... rec to incr Vit D supplement to 2000u.  MEMORY LOSS (ICD-780.93) - she notes poor memory and prev MRIBrain 11/08 showed old lacune in right thalamus, sm vessel dis, atrophy... she takes ASA 81mg /d...  Hx of VERTIGO (ICD-780.4) - uses Meclizine as needed...  ANXIETY (ICD-300.00) - she takes LEXAPRO 10mg /d and ALPRAZOLAM 0.5mg Tid Prn (both of which help her mood and anxiety)...  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   Past Surgical History  Procedure Laterality Date  . Cataract extraction    .  Abdominal hysterectomy    . Anterior cervical discectomy    . Lumbar laminectomy    . Right knee arthroscopy    . Right shoulder surgery  2008    Dr. Thurston Hole  . Right shoulder replacement  04/2009  . Carpal tunnel release  12/2011    right arm    Outpatient Encounter Prescriptions as of 01/13/2013  Medication Sig Dispense Refill  . ALPRAZolam (XANAX) 0.5 MG tablet Take 1/2 to 1 tablet by mouth three times daily as needed for nerves--not to exceed 3 per day  90 tablet  5  . aspirin 81 MG tablet Take 81 mg by mouth daily.        . Calcium Carbonate-Vitamin D (CALTRATE 600+D) 600-400 MG-UNIT per tablet Take 1 tablet by mouth daily.        . chlorpheniramine-HYDROcodone (TUSSIONEX PENNKINETIC ER) 10-8 MG/5ML LQCR Take 5 mLs by mouth every 12 (twelve) hours.  120 mL  5  . Cholecalciferol (VITAMIN D) 1000 UNITS capsule Take 1,000 Units by mouth daily.        . colchicine 0.6 MG tablet Take 0.6 mg by mouth daily. Per Dr. Corliss Skains       . cyclobenzaprine (FLEXERIL) 10 MG tablet Take 1/2 to 1 tablet by mouth three times daily as directed for muscle spasms  90 tablet  5  . escitalopram (LEXAPRO) 10 MG tablet Take 1 tablet (10 mg total) by mouth daily.  90 tablet  3  . HYDROcodone-acetaminophen (VICODIN) 5-500 MG per tablet Take 1 tablet by mouth every 6 (six) hours as needed.        . meclizine (ANTIVERT) 25 MG tablet Take 1/2 to 1 tablet by mouth three times daily as needed for dizziness      . Multiple Vitamins-Minerals (WOMENS MULTIVITAMIN PLUS) TABS Take 1 tablet by mouth daily.        . simvastatin (ZOCOR) 20 MG tablet Take 1 tablet (  20 mg total) by mouth at bedtime.  90 tablet  3  . [DISCONTINUED] azithromycin (ZITHROMAX) 250 MG tablet Take as directed  6 tablet  2   No facility-administered encounter medications on file as of 01/13/2013.    No Known Allergies   Current Medications, Allergies, Past Medical History, Past Surgical History, Family History, and Social History were reviewed in  [employer] record.    Review of Systems         See HPI - all other systems neg except as noted...  The patient complains of dyspnea on exertion and difficulty walking.  The patient denies anorexia, fever, weight loss, weight gain, vision loss, decreased hearing, hoarseness, chest pain, syncope, peripheral edema, prolonged cough, headaches, hemoptysis, abdominal pain, melena, hematochezia, severe indigestion/heartburn, hematuria, incontinence, muscle weakness, suspicious skin lesions, transient blindness, depression, unusual weight change, abnormal bleeding, enlarged lymph nodes, and angioedema.     Objective:   Physical Exam     WD, WN, 79 y/o WF in NAD... GENERAL:  Alert & oriented; pleasant & cooperative... HEENT:  [place]/AT, EOM-wnl, PERRLA w/ nystagmus, EACs-clear, TMs-wnl, NOSE-clear, THROAT-clear & wnl. NECK:  Supple w/ decr ROM; no JVD; normal carotid impulses w/o bruits; no thyromegaly or nodules palpated; no lymphadenopathy. CHEST:  Clear to P & A; without wheezes/ rales/ or rhonchi. HEART:  Regular Rhythm; without murmurs/ rubs/ or gallops. ABDOMEN:  Soft & nontender; normal bowel sounds; no organomegaly or masses detected. EXT: without deformities, mild arthritic changes; no varicose veins/ +venous insuffic/ tr edema. right shoulder well healed surgical scar & can raise right arm mid level... NEURO:  CN's intact; she has congenital nystagmus, no focal neuro deficits... DERM:  No lesions noted; no rash etc...  RADIOLOGY DATA:  Reviewed in the EPIC EMR & discussed w/ the patient...  LABORATORY DATA:  Reviewed in the EPIC EMR & discussed w/ the patient...    Assessment & Plan:    Hx Asthmatic Bronchitis>  Stable & not taking any regular breathing meds; she knows to avoid infections etc; CXR clear, NAD...     Hx Atyp CP>  Cards eval by DrBensimhon 2011 w/ ?abn myoview & she opted for conservative approach & med Rx- ASA, Simva20, risk factor reduction  strategy.     CHOL>  on Simva20 + diet efforts w/ FLP looks good w/ parameters at goal...     DJD>  s/p right TKR 7/12 by DrMartin at [facility] w/ post-op rehab etc; she is pleased w/ results; prev right shoulder arthroplasty 7/10 at [facility]; taking Vicodin prn pain...     Hx LBP>  on Flexeril Tid as needed; she's had remote LLam & doing satis w/ daily exercises (reminded to continue exercise etc)...     FM, Psuedogout>  followed by DrDeveshwar on meds above + Colchicine 0.6mg /d...     Vit D Defic>  her Vit D level = 33 on the 1000u daily, rec to incr to 2000u daily...     Memory Loss, Lacunar Infarct>  on ASA daily, MRI in 2008 showed old lacunar infarct, sm vessel dis & atrophy...     Anxiety/ Depression>  on Xanax & Lexapro; improved she says & confirmed by family, continue same meds...   Patient's Medications  New Prescriptions   No medications on file  Previous Medications   ASPIRIN 81 MG TABLET    Take 81 mg by mouth daily.     CALCIUM CARBONATE-VITAMIN D (CALTRATE 600+D) 600-400 MG-UNIT PER TABLET    Take 1  tablet by mouth daily.     CHLORPHENIRAMINE-HYDROCODONE (TUSSIONEX PENNKINETIC ER) 10-8 MG/5ML LQCR    Take 5 mLs by mouth every 12 (twelve) hours.   CHOLECALCIFEROL (VITAMIN D) 1000 UNITS CAPSULE    Take 1,000 Units by mouth daily.     COLCHICINE 0.6 MG TABLET    Take 0.6 mg by mouth daily. Per Dr. Corliss Skains    HYDROCODONE-ACETAMINOPHEN (VICODIN) 5-500 MG PER TABLET    Take 1 tablet by mouth every 6 (six) hours as needed.     MECLIZINE (ANTIVERT) 25 MG TABLET    Take 1/2 to 1 tablet by mouth three times daily as needed for dizziness   MULTIPLE VITAMINS-MINERALS (WOMENS MULTIVITAMIN PLUS) TABS    Take 1 tablet by mouth daily.     SIMVASTATIN (ZOCOR) 20 MG TABLET    Take 1 tablet (20 mg total) by mouth at bedtime.  Modified Medications   Modified Medication Previous Medication   ALPRAZOLAM (XANAX) 0.5 MG TABLET ALPRAZolam (XANAX) 0.5 MG tablet      Take 1/2 to 1 tablet by mouth three  times daily as needed for nerves--not to exceed 3 per day    Take 1/2 to 1 tablet by mouth three times daily as needed for nerves--not to exceed 3 per day   AZITHROMYCIN (ZITHROMAX) 250 MG TABLET azithromycin (ZITHROMAX) 250 MG tablet      Take as directed    Take as directed   CYCLOBENZAPRINE (FLEXERIL) 10 MG TABLET cyclobenzaprine (FLEXERIL) 10 MG tablet      Take 1/2 to 1 tablet by mouth three times daily as directed for muscle spasms    Take 1/2 to 1 tablet by mouth three times daily as directed for muscle spasms   ESCITALOPRAM (LEXAPRO) 10 MG TABLET escitalopram (LEXAPRO) 10 MG tablet      Take 1 tablet (10 mg total) by mouth daily.    Take 1 tablet (10 mg total) by mouth daily.  Discontinued Medications   No medications on file

## 2013-01-16 ENCOUNTER — Emergency Department (HOSPITAL_COMMUNITY): Payer: Medicare Other

## 2013-01-16 ENCOUNTER — Emergency Department (HOSPITAL_COMMUNITY)
Admission: EM | Admit: 2013-01-16 | Discharge: 2013-01-16 | Disposition: A | Discharge status: Home / Self Care | Payer: Medicare Other | Attending: Emergency Medicine | Admitting: Emergency Medicine

## 2013-01-16 ENCOUNTER — Other Ambulatory Visit: Payer: Self-pay

## 2013-01-16 ENCOUNTER — Encounter (HOSPITAL_COMMUNITY): Payer: Self-pay

## 2013-01-16 DIAGNOSIS — Z79899 Other long term (current) drug therapy: Secondary | ICD-10-CM | POA: Insufficient documentation

## 2013-01-16 DIAGNOSIS — W010XXA Fall on same level from slipping, tripping and stumbling without subsequent striking against object, initial encounter: Secondary | ICD-10-CM | POA: Insufficient documentation

## 2013-01-16 DIAGNOSIS — J45909 Unspecified asthma, uncomplicated: Secondary | ICD-10-CM | POA: Insufficient documentation

## 2013-01-16 DIAGNOSIS — S0100XA Unspecified open wound of scalp, initial encounter: Secondary | ICD-10-CM | POA: Diagnosis not present

## 2013-01-16 DIAGNOSIS — Z8669 Personal history of other diseases of the nervous system and sense organs: Secondary | ICD-10-CM | POA: Insufficient documentation

## 2013-01-16 DIAGNOSIS — S0993XA Unspecified injury of face, initial encounter: Secondary | ICD-10-CM | POA: Diagnosis not present

## 2013-01-16 DIAGNOSIS — F411 Generalized anxiety disorder: Secondary | ICD-10-CM | POA: Diagnosis not present

## 2013-01-16 DIAGNOSIS — Z23 Encounter for immunization: Secondary | ICD-10-CM | POA: Insufficient documentation

## 2013-01-16 DIAGNOSIS — S0180XA Unspecified open wound of other part of head, initial encounter: Secondary | ICD-10-CM | POA: Insufficient documentation

## 2013-01-16 DIAGNOSIS — E785 Hyperlipidemia, unspecified: Secondary | ICD-10-CM | POA: Diagnosis not present

## 2013-01-16 DIAGNOSIS — Z7982 Long term (current) use of aspirin: Secondary | ICD-10-CM | POA: Insufficient documentation

## 2013-01-16 DIAGNOSIS — Z8739 Personal history of other diseases of the musculoskeletal system and connective tissue: Secondary | ICD-10-CM | POA: Insufficient documentation

## 2013-01-16 DIAGNOSIS — Z87891 Personal history of nicotine dependence: Secondary | ICD-10-CM | POA: Diagnosis not present

## 2013-01-16 DIAGNOSIS — Y9289 Other specified places as the place of occurrence of the external cause: Secondary | ICD-10-CM | POA: Insufficient documentation

## 2013-01-16 DIAGNOSIS — S0181XA Laceration without foreign body of other part of head, initial encounter: Secondary | ICD-10-CM

## 2013-01-16 DIAGNOSIS — S199XXA Unspecified injury of neck, initial encounter: Secondary | ICD-10-CM | POA: Diagnosis not present

## 2013-01-16 DIAGNOSIS — Z8679 Personal history of other diseases of the circulatory system: Secondary | ICD-10-CM | POA: Insufficient documentation

## 2013-01-16 DIAGNOSIS — W1809XA Striking against other object with subsequent fall, initial encounter: Secondary | ICD-10-CM | POA: Insufficient documentation

## 2013-01-16 DIAGNOSIS — Y9301 Activity, walking, marching and hiking: Secondary | ICD-10-CM | POA: Insufficient documentation

## 2013-01-16 DIAGNOSIS — S0990XA Unspecified injury of head, initial encounter: Secondary | ICD-10-CM

## 2013-01-16 MED ORDER — TETANUS-DIPHTH-ACELL PERTUSSIS 5-2.5-18.5 LF-MCG/0.5 IM SUSP
0.5000 mL | Freq: Once | INTRAMUSCULAR | Status: AC
  Administered 2013-01-16: 0.5 mL via INTRAMUSCULAR
  Filled 2013-01-16: qty 0.5

## 2013-01-16 NOTE — ED Provider Notes (Signed)
History     CSN: [id number]  Arrival date & time 01/16/13  1414   First MD Initiated Contact with Patient 01/16/13 1508      Chief Complaint  Patient presents with  . Fall  . Head Laceration    (Consider location/radiation/quality/duration/timing/severity/associated sxs/prior treatment) The history is provided by the patient.  Jessica [name] is a 79 y.o. female history of arthritis, fibromyalgia here presenting with head laceration. She was walking in her room and tripped over something and hit the door frame. Denies any LOC or syncope or back or neck pain. Didn't remember when her last tetanus was. Denies any other injuries. She is on baby ASA daily and no coumadin or plavix.    Past Medical History  Diagnosis Date  . congenital nystagmus   . Acute asthmatic bronchitis   . Venous insufficiency   . Atypical chest pain   . Other and unspecified hyperlipidemia   . DJD (degenerative joint disease)   . Low back pain syndrome   . Fibromyalgia   . Memory loss   . Anxiety     Past Surgical History  Procedure Laterality Date  . Cataract extraction    . Abdominal hysterectomy    . Anterior cervical discectomy    . Lumbar laminectomy    . Right knee arthroscopy    . Right shoulder surgery  2008    Dr. Thurston Hole  . Right shoulder replacement  04/2009  . Carpal tunnel release  12/2011    right arm    No family history on file.  History  Substance Use Topics  . Smoking status: Former Smoker -- 2.00 packs/day    Types: Cigarettes    Quit date: 10/28/1991  . Smokeless tobacco: Not on file  . Alcohol Use: Not on file    OB History   Grav Para Term Preterm Abortions TAB SAB Ect Mult Living                  Review of Systems  Skin: Positive for wound.  All other systems reviewed and are negative.    Allergies  Review of patient's allergies indicates no known allergies.  Home Medications   Current Outpatient Rx  Name  Route  Sig  Dispense  Refill  . ALPRAZolam  (XANAX) 0.5 MG tablet      Take 1/2 to 1 tablet by mouth three times daily as needed for nerves--not to exceed 3 per day   90 tablet   5   . aspirin 81 MG tablet   Oral   Take 81 mg by mouth daily.           . Calcium Carbonate-Vitamin D (CALTRATE 600+D) 600-400 MG-UNIT per tablet   Oral   Take 1 tablet by mouth daily.           . Cholecalciferol (VITAMIN D) 1000 UNITS capsule   Oral   Take 1,000 Units by mouth daily.           Marland Kitchen escitalopram (LEXAPRO) 10 MG tablet   Oral   Take 1 tablet (10 mg total) by mouth daily.   90 tablet   3   . HYDROcodone-acetaminophen (VICODIN) 5-500 MG per tablet   Oral   Take 1 tablet by mouth every 6 (six) hours as needed for pain.          . simvastatin (ZOCOR) 20 MG tablet   Oral   Take 1 tablet (20 mg total) by mouth at bedtime.  90 tablet   3   . vitamin C (ASCORBIC ACID) 500 MG tablet   Oral   Take 500 mg by mouth daily.           There were no vitals taken for this visit.  Physical Exam  Nursing note and vitals reviewed. Constitutional: She is oriented to person, place, and time. She appears well-developed and well-nourished.  HENT:  Head: Normocephalic.  Mouth/Throat: Oropharynx is clear and moist.  No posterior hematoma. See skin section for laceration   Eyes: Conjunctivae are normal. Pupils are equal, round, and reactive to light.  Neck: Normal range of motion. Neck supple.  Cardiovascular: Normal rate, regular rhythm and normal heart sounds.   Pulmonary/Chest: Effort normal and breath sounds normal. No respiratory distress. She has no wheezes. She has no rales.  Abdominal: Soft. Bowel sounds are normal. She exhibits no distension. There is no tenderness. There is no rebound and no guarding.  Musculoskeletal: Normal range of motion.  No obvious injury in extremities.   Neurological: She is alert and oriented to person, place, and time.  Skin: Skin is warm and dry.  3 in vertical laceration on forehead that is  linear but there is a v shaped area on the super aspect. Well approximated.   Psychiatric: She has a normal mood and affect. Her behavior is normal. Judgment and thought content normal.    ED Course  Procedures (including critical care time)  LACERATION REPAIR Performed by: Chaney Malling Authorized by: Chaney Malling Consent: Verbal consent obtained. Risks and benefits: risks, benefits and alternatives were discussed Consent given by: patient Patient identity confirmed: provided demographic data Prepped and Draped in normal sterile fashion Wound explored  Laceration Location: forehead  Laceration Length: 8 cm, complicated   No Foreign Bodies seen or palpated  Anesthesia: local infiltration  Local anesthetic: lidocaine 2% with epinephrine  Anesthetic total: 10 ml  Irrigation method: syringe Amount of cleaning: standard  Skin closure: multi layer closure with 3 4-0 vicryl subcutaneous stitches and 8 6-0 ethilon superficial simple interrupted stitches   Number of sutures: 11  Technique: See above   Patient tolerance: Patient tolerated the procedure well with no immediate complications.    Labs Reviewed - No data to display Ct Head Wo Contrast  01/16/2013  *RADIOLOGY REPORT*  Clinical Data: Fall, laceration  CT HEAD WITHOUT CONTRAST,CT CERVICAL SPINE WITHOUT CONTRAST  Technique:  Contiguous axial images were obtained from the base of the skull through the vertex without contrast.,Technique: Multidetector CT imaging of the cervical spine was performed. Multiplanar CT image reconstructions were also generated.  Comparison: 09/09/2007  Findings: No skull fracture is noted.  There is scalp laceration left frontal region adjacent to midline. No intracranial hemorrhage, mass effect or midline shift.  Moderate cerebral atrophy.  Periventricular white matter decreased attenuation is probable due to chronic small vessel ischemic changes.  No acute infarction.  No mass lesion is noted on this  unenhanced scan.  IMPRESSION: There is no acute intracranial abnormality.  Moderate cerebral atrophy.  Scalp laceration in the left frontal region.  CT cervical spine without IV contrast:  Axial images of the cervical spine shows no acute fracture or subluxation.  Computer processed images shows degenerative changes C1-C2 articulation.  Postsurgical changes with anterior metallic fusion plate noted at W0-J8 level.  There is disc space flattening with mild anterior and mild posterior spurring at C4-C5 and C5-C6 level. Mild disc space flattening with mild anterior spurring at C6-C7 level.  No prevertebral  soft tissue swelling.  Cervical airway is patent.  Impression: 1.  No acute fracture or subluxation.  Degenerative changes as described above. 2.  Postsurgical changes at C3-C4 level.   Original Report Authenticated By: Natasha Mead, M.D.    Ct Cervical Spine Wo Contrast  01/16/2013  *RADIOLOGY REPORT*  Clinical Data: Fall, laceration  CT HEAD WITHOUT CONTRAST,CT CERVICAL SPINE WITHOUT CONTRAST  Technique:  Contiguous axial images were obtained from the base of the skull through the vertex without contrast.,Technique: Multidetector CT imaging of the cervical spine was performed. Multiplanar CT image reconstructions were also generated.  Comparison: 09/09/2007  Findings: No skull fracture is noted.  There is scalp laceration left frontal region adjacent to midline. No intracranial hemorrhage, mass effect or midline shift.  Moderate cerebral atrophy.  Periventricular white matter decreased attenuation is probable due to chronic small vessel ischemic changes.  No acute infarction.  No mass lesion is noted on this unenhanced scan.  IMPRESSION: There is no acute intracranial abnormality.  Moderate cerebral atrophy.  Scalp laceration in the left frontal region.  CT cervical spine without IV contrast:  Axial images of the cervical spine shows no acute fracture or subluxation.  Computer processed images shows degenerative  changes C1-C2 articulation.  Postsurgical changes with anterior metallic fusion plate noted at N5-A2 level.  There is disc space flattening with mild anterior and mild posterior spurring at C4-C5 and C5-C6 level. Mild disc space flattening with mild anterior spurring at C6-C7 level.  No prevertebral soft tissue swelling.  Cervical airway is patent.  Impression: 1.  No acute fracture or subluxation.  Degenerative changes as described above. 2.  Postsurgical changes at C3-C4 level.   Original Report Authenticated By: Natasha Mead, M.D.      No diagnosis found.    MDM  COURTLAND COPPA is a 79 y.o. female here with mechanical fall. Will do CT head/neck. Will suture laceration and update tetanus.   5:03 PM CT head/neck normal. Complicated laceration repaired. Recommend PMD or urgent f/u in 7 days for suture removal.         Richardean Canal, MD 01/16/13 [phone]

## 2013-01-16 NOTE — ED Notes (Signed)
Pt presents with NAD- daughter present- Pt recalls tripping over "tub" in hall hitting door jam.  Denies LOC, neck pain, back pain. Head laceration 3 inch vertical laceration to forehead.  Bleeding controlled- dressing applied for small amount of bleeding.  GCS 15 PEERL. No other injuries visual from chest upward.  Becca RN updated on pt current status

## 2013-01-16 NOTE — ED Notes (Signed)
Patient transported to CT 

## 2013-01-16 NOTE — ED Notes (Signed)
Pt resting quietly. Denies pain at present. States her head "feels numb". Pt with no acute distress. Dressing remains on head wound. Bleeding controlled.

## 2013-01-25 ENCOUNTER — Encounter (HOSPITAL_COMMUNITY): Payer: Self-pay

## 2013-01-25 ENCOUNTER — Emergency Department (INDEPENDENT_AMBULATORY_CARE_PROVIDER_SITE_OTHER)
Admission: EM | Admit: 2013-01-25 | Discharge: 2013-01-25 | Disposition: A | Discharge status: Home / Self Care | Payer: Medicare Other | Source: Home / Self Care

## 2013-01-25 DIAGNOSIS — Z4802 Encounter for removal of sutures: Secondary | ICD-10-CM

## 2013-01-25 NOTE — ED Notes (Signed)
Here for wound check and poss suture removal

## 2013-01-25 NOTE — ED Provider Notes (Signed)
History     CSN: [id number]  Arrival date & time 01/25/13  1028   First MD Initiated Contact with Patient 01/25/13 1119      Chief Complaint  Patient presents with  . Wound Check    (Consider location/radiation/quality/duration/timing/severity/associated sxs/prior treatment) HPI Comments: A 79-year-old patient had a fall approximately one week ago and was evaluated emergency department. She suffered a approximately 4 cm vertical laceration to the fore head. And was closed in emergency department. She is here for suture removal   Past Medical History  Diagnosis Date  . congenital nystagmus   . Acute asthmatic bronchitis   . Venous insufficiency   . Atypical chest pain   . Other and unspecified hyperlipidemia   . DJD (degenerative joint disease)   . Low back pain syndrome   . Fibromyalgia   . Memory loss   . Anxiety     Past Surgical History  Procedure Laterality Date  . Cataract extraction    . Abdominal hysterectomy    . Anterior cervical discectomy    . Lumbar laminectomy    . Right knee arthroscopy    . Right shoulder surgery  2008    Dr. Thurston Hole  . Right shoulder replacement  04/2009  . Carpal tunnel release  12/2011    right arm    History reviewed. No pertinent family history.  History  Substance Use Topics  . Smoking status: Former Smoker -- 2.00 packs/day    Types: Cigarettes    Quit date: 10/28/1991  . Smokeless tobacco: Not on file  . Alcohol Use: Not on file    OB History   Grav Para Term Preterm Abortions TAB SAB Ect Mult Living                  Review of Systems  All other systems reviewed and are negative.    Allergies  Review of patient's allergies indicates no known allergies.  Home Medications   Current Outpatient Rx  Name  Route  Sig  Dispense  Refill  . ALPRAZolam (XANAX) 0.5 MG tablet      Take 1/2 to 1 tablet by mouth three times daily as needed for nerves--not to exceed 3 per day   90 tablet   5   . aspirin 81 MG  tablet   Oral   Take 81 mg by mouth daily.           . Calcium Carbonate-Vitamin D (CALTRATE 600+D) 600-400 MG-UNIT per tablet   Oral   Take 1 tablet by mouth daily.           . Cholecalciferol (VITAMIN D) 1000 UNITS capsule   Oral   Take 1,000 Units by mouth daily.           Marland Kitchen escitalopram (LEXAPRO) 10 MG tablet   Oral   Take 1 tablet (10 mg total) by mouth daily.   90 tablet   3   . HYDROcodone-acetaminophen (VICODIN) 5-500 MG per tablet   Oral   Take 1 tablet by mouth every 6 (six) hours as needed for pain.          . simvastatin (ZOCOR) 20 MG tablet   Oral   Take 1 tablet (20 mg total) by mouth at bedtime.   90 tablet   3   . vitamin C (ASCORBIC ACID) 500 MG tablet   Oral   Take 500 mg by mouth daily.           BP 153/52  Pulse 66  Temp(Src) 98.1 F (36.7 C) (Oral)  Resp 16  SpO2 100%  Physical Exam  Nursing note and vitals reviewed. Constitutional: She appears well-developed and well-nourished. No distress.  Neck: Neck supple.  Pulmonary/Chest: Effort normal.  Skin:  Sutures intact. Edges are well approximated and the wound is healing well. No signs of infection    ED Course  SUTURE REMOVAL Date/Time: 01/25/2013 11:48 AM Performed by: Phineas Real, Atziri Zubiate Authorized by: Bradd Canary D Consent: Verbal consent obtained. Risks and benefits: risks, benefits and alternatives were discussed Consent given by: patient Patient identity confirmed: verbally with patient Body area: head/neck Location details: forehead Wound Appearance: clean Sutures Removed: 6 Comments: All sutures removed and  Healing well.    (including critical care time)  Labs Reviewed - No data to display No results found.   1. Visit for suture removal       MDM  Is healing nicely. All sutures were removed. May return for any new symptoms problems or worsening.    Hayden Rasmussen, NP 01/25/13 1151

## 2013-01-27 NOTE — ED Provider Notes (Signed)
Medical screening examination/treatment/procedure(s) were performed by resident physician or non-physician practitioner and as supervising physician I was immediately available for consultation/collaboration.   KINDL,JAMES DOUGLAS MD.   James D Kindl, MD 01/27/13 1943 

## 2013-02-07 ENCOUNTER — Other Ambulatory Visit: Payer: Self-pay

## 2013-02-07 ENCOUNTER — Ambulatory Visit
Admission: RE | Admit: 2013-02-07 | Discharge: 2013-02-07 | Disposition: A | Discharge status: Home / Self Care | Payer: Medicare Other | Source: Ambulatory Visit

## 2013-02-07 DIAGNOSIS — L821 Other seborrheic keratosis: Secondary | ICD-10-CM | POA: Diagnosis not present

## 2013-02-07 DIAGNOSIS — Z1231 Encounter for screening mammogram for malignant neoplasm of breast: Secondary | ICD-10-CM | POA: Diagnosis not present

## 2013-02-07 DIAGNOSIS — D1801 Hemangioma of skin and subcutaneous tissue: Secondary | ICD-10-CM | POA: Diagnosis not present

## 2013-02-16 ENCOUNTER — Telehealth (HOSPITAL_COMMUNITY): Payer: Self-pay | Admitting: Emergency Medicine

## 2013-02-16 ENCOUNTER — Emergency Department (INDEPENDENT_AMBULATORY_CARE_PROVIDER_SITE_OTHER)
Admission: EM | Admit: 2013-02-16 | Discharge: 2013-02-16 | Disposition: A | Discharge status: Home / Self Care | Payer: Medicare Other | Source: Home / Self Care | Attending: Family Medicine | Admitting: Family Medicine

## 2013-02-16 ENCOUNTER — Encounter (HOSPITAL_COMMUNITY): Payer: Self-pay

## 2013-02-16 DIAGNOSIS — Z5189 Encounter for other specified aftercare: Secondary | ICD-10-CM

## 2013-02-16 NOTE — ED Provider Notes (Signed)
History     CSN: [id number]  Arrival date & time 02/16/13  1515   First MD Initiated Contact with Patient 02/16/13 1736      Chief Complaint  Patient presents with  . Wound Check    (Consider location/radiation/quality/duration/timing/severity/associated sxs/prior treatment) HPI Comments: Pt had stitches from wound in forehead removed last week, concerned one was left behind because can see white colored thread in wound.   Patient is a 79 y.o. female presenting with wound check. The history is provided by the patient and a relative.  Wound Check This is a new problem. Episode onset: in March. The problem occurs constantly. The problem has been gradually improving. Nothing aggravates the symptoms. Nothing relieves the symptoms. Treatments tried: sutures. The treatment provided significant relief.    Past Medical History  Diagnosis Date  . congenital nystagmus   . Acute asthmatic bronchitis   . Venous insufficiency   . Atypical chest pain   . Other and unspecified hyperlipidemia   . DJD (degenerative joint disease)   . Low back pain syndrome   . Fibromyalgia   . Memory loss   . Anxiety     Past Surgical History  Procedure Laterality Date  . Cataract extraction    . Abdominal hysterectomy    . Anterior cervical discectomy    . Lumbar laminectomy    . Right knee arthroscopy    . Right shoulder surgery  2008    Dr. Thurston Hole  . Right shoulder replacement  04/2009  . Carpal tunnel release  12/2011    right arm    History reviewed. No pertinent family history.  History  Substance Use Topics  . Smoking status: Former Smoker -- 2.00 packs/day    Types: Cigarettes    Quit date: 10/28/1991  . Smokeless tobacco: Not on file  . Alcohol Use: Not on file    OB History   Grav Para Term Preterm Abortions TAB SAB Ect Mult Living                  Review of Systems  Constitutional: Negative for fever and chills.  Skin: Positive for wound. Negative for color change.     Allergies  Review of patient's allergies indicates no known allergies.  Home Medications   Current Outpatient Rx  Name  Route  Sig  Dispense  Refill  . ALPRAZolam (XANAX) 0.5 MG tablet      Take 1/2 to 1 tablet by mouth three times daily as needed for nerves--not to exceed 3 per day   90 tablet   5   . aspirin 81 MG tablet   Oral   Take 81 mg by mouth daily.           . Calcium Carbonate-Vitamin D (CALTRATE 600+D) 600-400 MG-UNIT per tablet   Oral   Take 1 tablet by mouth daily.           . Cholecalciferol (VITAMIN D) 1000 UNITS capsule   Oral   Take 1,000 Units by mouth daily.           Marland Kitchen escitalopram (LEXAPRO) 10 MG tablet   Oral   Take 1 tablet (10 mg total) by mouth daily.   90 tablet   3   . HYDROcodone-acetaminophen (VICODIN) 5-500 MG per tablet   Oral   Take 1 tablet by mouth every 6 (six) hours as needed for pain.          . simvastatin (ZOCOR) 20 MG tablet  Oral   Take 1 tablet (20 mg total) by mouth at bedtime.   90 tablet   3   . vitamin C (ASCORBIC ACID) 500 MG tablet   Oral   Take 500 mg by mouth daily.           BP 146/55  Pulse 70  Temp(Src) 97.9 F (36.6 C) (Oral)  Resp 16  SpO2 96%  Physical Exam  Constitutional: She appears well-developed and well-nourished. No distress.  Skin: Skin is warm and dry. No erythema.  2 areas in healing forehead laceration with white suture visible. No purulent drainage, no erythema.     ED Course  Procedures (including critical care time)  Labs Reviewed - No data to display No results found.   1. Visit for wound check       MDM  Healing laceration. Pt able to see 2 spots of white dissolvable, internal suture. Pt reassured.         Cathlyn Parsons, NP 02/16/13 [phone]

## 2013-02-16 NOTE — ED Notes (Signed)
Vitals taken by xray student 

## 2013-02-16 NOTE — ED Notes (Signed)
Here for wound check; family concern for retained suture; wound checked, white colored suture material visible; NAD

## 2013-02-17 NOTE — ED Provider Notes (Signed)
Medical screening examination/treatment/procedure(s) were performed by non-physician practitioner and as supervising physician I was immediately available for consultation/collaboration.   MORENO-COLL,Daily Crate; MD  Charlottie Peragine Moreno-Coll, MD 02/17/13 0925 

## 2013-02-23 ENCOUNTER — Telehealth: Payer: Self-pay | Admitting: Pulmonary Disease

## 2013-02-23 NOTE — Telephone Encounter (Signed)
Pt's daughter is aware of SN recommendations. She states that they already MMW.  I advised her to call back if she doesn't get any better.

## 2013-02-23 NOTE — Telephone Encounter (Signed)
Spoke with pt's daughter  Pt is c/o prod cough with clear sputum, chest congestion, and sore throat x 2 days Started on zpack that she had on yesterday and is c/o not feeling any better yet I advised that it probably needs more time to work, but since mucus is not purulent, may not even need this SN, what do you rec? Please advise thanks! Pt last seen 07/20/12 Next ov 07/20/13 No Known Allergies

## 2013-02-23 NOTE — Telephone Encounter (Signed)
Per SN---  Rest at home, fluids, mucinex 2 po bid, it takes a little time for the zpak to start working,  Can call in MMW for the sore throat if she needs this  #4oz  1 tsp gargle and swallow QID and she can use hot tea with lemon and honey.  thanks

## 2013-04-13 DIAGNOSIS — H353290 Exudative age-related macular degeneration, unspecified eye, stage unspecified: Secondary | ICD-10-CM | POA: Diagnosis not present

## 2013-04-13 DIAGNOSIS — H35059 Retinal neovascularization, unspecified, unspecified eye: Secondary | ICD-10-CM | POA: Diagnosis not present

## 2013-07-14 ENCOUNTER — Other Ambulatory Visit: Payer: Self-pay | Admitting: Pulmonary Disease

## 2013-07-14 MED ORDER — ALPRAZOLAM 0.5 MG PO TABS: ORAL_TABLET | ORAL | Status: DC

## 2013-07-20 ENCOUNTER — Ambulatory Visit (INDEPENDENT_AMBULATORY_CARE_PROVIDER_SITE_OTHER): Payer: Medicare Other | Admitting: Pulmonary Disease

## 2013-07-20 ENCOUNTER — Encounter: Payer: Self-pay | Admitting: Pulmonary Disease

## 2013-07-20 VITALS — BP 114/66 | HR 69 | Temp 97.4°F | Ht 65.5 in | Wt 151.8 lb

## 2013-07-20 DIAGNOSIS — I872 Venous insufficiency (chronic) (peripheral): Secondary | ICD-10-CM | POA: Diagnosis not present

## 2013-07-20 DIAGNOSIS — J209 Acute bronchitis, unspecified: Secondary | ICD-10-CM

## 2013-07-20 DIAGNOSIS — R413 Other amnesia: Secondary | ICD-10-CM

## 2013-07-20 DIAGNOSIS — M1990 Unspecified osteoarthritis, unspecified site: Secondary | ICD-10-CM

## 2013-07-20 DIAGNOSIS — M545 Low back pain: Secondary | ICD-10-CM

## 2013-07-20 DIAGNOSIS — R079 Chest pain, unspecified: Secondary | ICD-10-CM | POA: Diagnosis not present

## 2013-07-20 DIAGNOSIS — E559 Vitamin D deficiency, unspecified: Secondary | ICD-10-CM

## 2013-07-20 DIAGNOSIS — F411 Generalized anxiety disorder: Secondary | ICD-10-CM

## 2013-07-20 DIAGNOSIS — E785 Hyperlipidemia, unspecified: Secondary | ICD-10-CM | POA: Diagnosis not present

## 2013-07-20 MED ORDER — AZITHROMYCIN 250 MG PO TABS: 250.0000 mg | ORAL_TABLET | Freq: Every day | ORAL | Status: DC

## 2013-07-20 MED ORDER — ALBUTEROL SULFATE HFA 108 (90 BASE) MCG/ACT IN AERS: INHALATION_SPRAY | RESPIRATORY_TRACT | Status: DC

## 2013-07-20 NOTE — Patient Instructions (Addendum)
Today we updated your med list in our EPIC system...    Continue your current medications the same...  We wrote a new prescription for a Ventolin inhaler to use as needed and refilled your ZPak to keep on hand...  Continue the MUCINEX 600mg  - 2 tabs twice daily w/ lots of fluids...  Try the warm towel trick behinf the ears 7 on your neck for the discomfort in that area...  Call for any questions...  Let's plan a follow up visit in 42mo w/ FASTING blood work at that time.Marland KitchenMarland Kitchen

## 2013-07-20 NOTE — Progress Notes (Signed)
Subjective:    Patient ID: Jessica [name], female    DOB: 01/25/1926, 79 y.o.   MRN: [id number]  HPI 79 y/o WF here for a follow up visit... she has multiple medical problems as noted below...    ~  January 14, 2012:  34mo ROV & [place] tells me that she had right CTS surg by DrWeisler at [facility] about 3 weeks ago- still having some pain but improved;  She wants [name] to refill her [name] today...    Hx Asthmatic Bronchitis> she denies recent exac and is not taking any regular breathing meds; she knows to avoid infections etc...    Hx Atyp CP> she had Cards eval 2011 by DrBensimhon w/ ?abn myoview & she opted for conservative approach & med Rx- ASA, Simva20, risk factor reduction strategy; states she is doing well & denies recent CP, palpit, ch in SOB, etc...    CHOL> on Simva20 + diet efforts w/ FLP showing TChol 163, TG 55, HDL 65, LDL 87    DJD> s/p recent right CTS surg by  DrWeisler at [facility]; s/p right TKR 7/12 by DrMartin at [facility] w/ post-op rehab etc; prev right shoulder arthroplasty 7/10 at [facility]; she also sees DrDeveshwar> on Colchicine 0.6mg /d, Flexeril prn, Vicodin prn...    Hx LBP> she's had remote LLam & doing satis w/ daily exercises (reminded to continue exercise etc)...    FM, Psuedogout> followed by DrDeveshwar on meds above...    Vit D Defic> her Vit D level has improved on her 1000u OTC supplement daily..    Memory Loss, Lacunar Infarct> on ASA daily, MRI in 2008 showed old lacunar infarct, sm vessel dis & atrophy...    Anxiety/ Depression> on Xanax & Lexapro; improved she says & confirmed by family, continue same meds... CXR 3/13showed stable heart size, clear lungs w/ some hyperinflation, right humeral prosthesis, DJD in spine... LABS 3/12:  FLP- at goals on Simva20;  Chems- wnl;  CBC- wnl;  TSH=1.40  ~  July 16, 2012:  34mo ROV & [name] reports doing well- no new complaints or concerns... In the interval she has seen DrWolicki for cerumen impaction, otherw no other acute medical  issues... Her FLP looks good on the Simva20;  She reports doing well on Lexapro & Xanax...    We reviewed prob list, meds, xrays and labs> see below for updates >> [place] Flu vaccine today & Rx written for Shingles vaccine.  ~  January 13, 2013:  34mo ROV & [name] is stable, doing well, and denies new complaints or concerns; We reviewed the following medical problems during today's office visit >>     Hx Asthmatic Bronchitis> on Tussionex prn; she denies recent exac and is not taking any regular breathing meds; she knows to avoid infections etc...    Hx AtypCP> on ASA81; she had Cards eval 2011 by DrBensimhon w/ ?abn myoview & she opted for conservative approach & med Rx- ASA, Simva20, risk factor reduction strategy; states she is doing well & denies recent CP, palpit, ch in SOB, etc...    CHOL> on Simva20 + diet efforts w/ FLP showing TChol 165, TG 68, HDL 51, LDL 100    DJD> s/p right CTS surg by DrWeisler at [facility]; s/p right TKR 7/12 by DrMartin at [facility] w/ post-op rehab etc; prev right shoulder arthroplasty 7/10 at [facility]; she also sees DrDeveshwar> on Colchicine 0.6mg /d, Flexeril prn, Vicodin prn...    Hx LBP> she's had remote LLam & doing satis w/ daily exercises (  reminded to continue exercise etc)...    FM, Psuedogout> followed by DrDeveshwar on meds above...    Vit D Defic> her Vit D level = 33 on 1000u daily; rec to incr to 2000u per day...    Memory Loss, Lacunar Infarct> on ASA daily, MRI in 2008 showed old lacunar infarct, sm vessel dis & atrophy...    Anxiety/ Depression> on Xanax0.5 & Lexapro10; improved she says & confirmed by family, continue same meds... We reviewed prob list, meds, xrays and labs> see below for updates >> she had Flu vaccine 10/13, & we wrote Rx for Shingles vax... CXR 3/14 showed normal heart size, clear lungs, calcifAo, right shoulder prosthesis, DJD in Tspine... LABS 3/14>  FLP- at goals on Simva20;  Chems- wnl;  CBC- wnl w/ Hg=12.6;  TSH= 1.04;  VitD=33...   ~  July 20, 2013:  4mo ROV & [name] has had a good interval- stable w/o signif new complaints or concerns...    She has HxAB on no regular meds; she likes to keep ZPak & Tussionex on hand, but not on regular inhalers and no exac in the interval...    Hx AtypCP and conserv Rx from Cards; doing satis w/o CP, palpit, SOB, etc but needs to be more active, incr exercise, etc...    Chol is well regulated w/ Simva20 + diet, weight is 152# and stable...    Hx DJD, LBP, FM, pseudogout> on Colchicine 0.6mg /d, Flexeril prn, Vicodin prn & followed by Rober Minion- she has had numerous operations from Ortho at [facility] as noted...    Memory loss, Hx lacunar infarct, anxiety, depression> on ASA 81 + Xanax & Lexapro; stablr w/o cerebral ischemic symptoms...   We reviewed prob list, meds, xrays and labs> see below for updates >> OK 2014 Flu vaccine today...            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 she states doing well- just wants a ZPak for Prn use. ~  she is an ex-smoker having smoked from age 38 to 66 up to 2ppd, "but I just puffed, didn't inhale" ~  baseline CXR 8/08 showed clear lungs x scarring left base & DJD left shoulder... ~  PFT's 11/05 showed FVC= 2.21 (81%), FEV1=1.35 (68%), %1sec=61, mid-flows=30%pred... ~  CT Angio Chest 8/11 showed 5mm nodule RLL w/ fu CT in 4mo rec... ~  12/11:  she had acute exac Rx w/ Augmentin, Depo/ Dosepak, Mucinex, etc... ~  3/12: f/u CTChest showed stable 5mm RUL nodule, some scarring & centrilob emphysema, coronary calcif is seen... ~  CXR 3/13 showed stable heart size, clear lungs w/ some hyperinflation, right humeral prosthesis, DJD in spine... ~  CXR 3/14 showed normal heart size, clear lungs, calcifAo, right shoulder prosthesis, DJD in Tspine.  Hx of CHEST PAIN  (ICD-786.50) < SEE 8/17-19/11 HOSP > EKG, Enz, 2DEcho, CTA...  subseq outpt Myoview showed ?ischemia, norm EF; eval by DrBensimhon w/ option for med rx vs cath & she chose med rx w/ ASA, Simva20... ~  EKG 1/12 showed NSR, rate61, poor r prog V1-2, NAD...  ~  EKG 3/14 showed NSR, rate71, poor r progression V1-3, NAD...  VENOUS INSUFFICIENCY (ICD-459.81) - VI changes without swelling... prev tinea pedis on right foot betw 4th-5th toes has resolved w/ Lotrisone cream...  HYPERCHOLESTEROLEMIA, BORDERLINE (ICD-272.4) - on SIMVASTATIN 20mg /d + diet rx now... ~  FLP 2/10 showed TChol 179, TG 108, HDL 41, LDL 117... rec> better diet, incr exerc. ~  10/11:  started on Simva20 & she is due for f/u FLP- asked to ret fasting for this lab. ~  FLP 3/12 on Simva20 showed TChol 183, TG 110, HDL 43, LDL 118... She declined incr dose, therefore needs better diet. ~  FLP 3/13 on Simva20 showed TChol 163, TG 55, HDL 65, LDL 87 ~  FLP 3/14 on simva20 showed TChol 165, TG 68, HDL 51, LDL 100   UTI'S, HX OF (ICD-V13.00) - CT Angio chest 8/11 in hosp also showed ? hydronephrosis vs parapelvic cysts> subseq CT Abd confirmed cysts...  DEGENERATIVE JOINT DISEASE (ICD-715.90) - she is followed by DrDeveshwar for Rheum w/ Pseudogout (CPPD) on COLCHICINE 0.6mg /d (when she can afford it); Osteoarthritis w/ right shoulder arthroplasty 7/10 at [facility]; & DDD... also followed by DrKramer who treated her left hand fracture after a fall last yr & has injected her knees...  she takes VICODIN Prn, FLEXERIL Prn, & COLCHICINE 0.6mg /d... ~  7/10:  s/p right Shoulder Arthroplasty at [facility] ~  12/11:  c/o right knee pain which she thinks is "gout" & she will f/u w/ DrKramer- severe DJD & needs TKR... ~  7/12:  s/p right TKR at [facility] by DrMartin... ~  3/13: s/p right CTS surg at [facility] by DrWeisler  LOW BACK PAIN SYNDROME (ICD-724.2) - she had prev Lumbar Lam & known DDD... she uses VICODIN and FLEXERIL as needed... she states that she is stable without new complaints or concerns...  FIBROMYALGIA (ICD-729.1) -  followed by DrDeveshwar and her notes are reviewed...  VITAMIN D DEFICIENCY (ICD-268.9) - on Vit D 1000 u OTC daily... ~  labs 8/09 showed Vit D level = 27... rec> start OTC Vit D supplement. ~  labs 9/10 showed Vit D level = 24... rec> take Vit D 1000 u daily. ~  labs 3/12 showed Vit D level = 41... continue 1000 u daily. ~  Labs 3/14 showed Vit D level = 33... rec to incr Vit D supplement to 2000u.  MEMORY LOSS (ICD-780.93) - she notes poor memory and prev MRIBrain 11/08 showed old lacune in right thalamus, sm vessel dis, atrophy... she takes ASA 81mg /d...  Hx of VERTIGO (ICD-780.4) - uses Meclizine as needed...  ANXIETY (ICD-300.00) - she takes LEXAPRO 10mg /d and ALPRAZOLAM 0.5mg Tid Prn (both of which help her mood and anxiety)...  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   Past Surgical History  Procedure Laterality Date  . Cataract extraction    . Abdominal hysterectomy    . Anterior cervical discectomy    . Lumbar laminectomy    . Right knee arthroscopy    . Right shoulder surgery  2008    Dr. Thurston Hole  . Right shoulder replacement  04/2009  . Carpal tunnel release  12/2011    right arm    Outpatient Encounter Prescriptions as of 07/20/2013  Medication Sig Dispense Refill  . ALPRAZolam (XANAX) 0.5 MG tablet Take 1/2 to 1 tablet by mouth three times daily as needed for nerves--not to exceed 3 per day  90 tablet  5  . aspirin 81 MG tablet Take 81 mg by mouth daily.        . Calcium Carbonate-Vitamin D (CALTRATE 600+D) 600-400 MG-UNIT per tablet Take 1 tablet by mouth daily.        . Cholecalciferol (VITAMIN D) 1000 UNITS capsule Take 1,000  Units by mouth daily.        Marland Kitchen escitalopram (LEXAPRO) 10 MG tablet Take 1 tablet (10 mg total) by mouth daily.  90 tablet  3  . HYDROcodone-acetaminophen (VICODIN) 5-500 MG per tablet Take 1 tablet by mouth every 6 (six) hours as needed for pain.       . simvastatin (ZOCOR) 20 MG tablet Take 1  tablet (20 mg total) by mouth at bedtime.  90 tablet  3  . vitamin C (ASCORBIC ACID) 500 MG tablet Take 500 mg by mouth daily.       No facility-administered encounter medications on file as of 07/20/2013.    No Known Allergies   Current Medications, Allergies, Past Medical History, Past Surgical History, Family History, and Social History were reviewed in [employer] record.    Review of Systems         See HPI - all other systems neg except as noted...  The patient complains of dyspnea on exertion and difficulty walking.  The patient denies anorexia, fever, weight loss, weight gain, vision loss, decreased hearing, hoarseness, chest pain, syncope, peripheral edema, prolonged cough, headaches, hemoptysis, abdominal pain, melena, hematochezia, severe indigestion/heartburn, hematuria, incontinence, muscle weakness, suspicious skin lesions, transient blindness, depression, unusual weight change, abnormal bleeding, enlarged lymph nodes, and angioedema.     Objective:   Physical Exam     WD, WN, 79 y/o WF in NAD... GENERAL:  Alert & oriented; pleasant & cooperative... HEENT:  [place]/AT, EOM-wnl, PERRLA w/ nystagmus, EACs-clear, TMs-wnl, NOSE-clear, THROAT-clear & wnl. NECK:  Supple w/ decr ROM; no JVD; normal carotid impulses w/o bruits; no thyromegaly or nodules palpated; no lymphadenopathy. CHEST:  Clear to P & A; without wheezes/ rales/ or rhonchi. HEART:  Regular Rhythm; without murmurs/ rubs/ or gallops. ABDOMEN:  Soft & nontender; normal bowel sounds; no organomegaly or masses detected. EXT: without deformities, mild arthritic changes; no varicose veins/ +venous insuffic/ tr edema. right shoulder well healed surgical scar & can raise right arm mid level... NEURO:  CN's intact; she has congenital nystagmus, no focal neuro deficits... DERM:  No lesions noted; no rash etc...  RADIOLOGY DATA:  Reviewed in the EPIC EMR & discussed w/ the patient...  LABORATORY DATA:   Reviewed in the EPIC EMR & discussed w/ the patient...    Assessment & Plan:    Hx Asthmatic Bronchitis>  Stable & not taking any regular breathing meds; she knows to avoid infections etc; CXR clear, NAD...     Hx Atyp CP>  Cards eval by DrBensimhon 2011 w/ ?abn myoview & she opted for conservative approach & med Rx- ASA, Simva20, risk factor reduction strategy.     CHOL>  on Simva20 + diet efforts w/ FLP looks good w/ parameters at goal...     DJD>  s/p right TKR 7/12 by DrMartin at [facility] w/ post-op rehab etc; she is pleased w/ results; prev right shoulder arthroplasty 7/10 [facility]; taking Vicodin prn pain...     Hx LBP>  on Flexeril Tid as needed; she's had remote LLam & doing satis w/ daily exercises (reminded to continue exercise etc)...     FM, Psuedogout>  followed by DrDeveshwar on meds above + Colchicine 0.6mg /d...     Vit D Defic>  her Vit D level = 33 on the 1000u daily, rec to incr to 2000u daily...     Memory Loss, Lacunar Infarct>  on ASA daily, MRI in 2008 showed old lacunar infarct, sm vessel dis & atrophy.Marland KitchenMarland Kitchen  Anxiety/ Depression>  on Xanax & Lexapro; improved she says & confirmed by family, continue same meds...   Patient's Medications  New Prescriptions   ALBUTEROL (PROVENTIL HFA;VENTOLIN HFA) 108 (90 BASE) MCG/ACT INHALER    1-2 puffs every 4 hours as needed   AZITHROMYCIN (ZITHROMAX) 250 MG TABLET    Take 1 tablet (250 mg total) by mouth daily. As needed use  Previous Medications   ALPRAZOLAM (XANAX) 0.5 MG TABLET    Take 1/2 to 1 tablet by mouth three times daily as needed for nerves--not to exceed 3 per day   ASPIRIN 81 MG TABLET    Take 81 mg by mouth daily.     CALCIUM CARBONATE-VITAMIN D (CALTRATE 600+D) 600-400 MG-UNIT PER TABLET    Take 1 tablet by mouth daily.     CHOLECALCIFEROL (VITAMIN D) 1000 UNITS CAPSULE    Take 1,000 Units by mouth daily.     ESCITALOPRAM (LEXAPRO) 10 MG TABLET    Take 1 tablet (10 mg total) by mouth daily.    HYDROCODONE-ACETAMINOPHEN (VICODIN) 5-500 MG PER TABLET    Take 1 tablet by mouth every 6 (six) hours as needed for pain.    VITAMIN C (ASCORBIC ACID) 500 MG TABLET    Take 500 mg by mouth daily.  Modified Medications   Modified Medication Previous Medication   SIMVASTATIN (ZOCOR) 20 MG TABLET simvastatin (ZOCOR) 20 MG tablet      Take 1 tablet (20 mg total) by mouth at bedtime.    Take 1 tablet (20 mg total) by mouth at bedtime.  Discontinued Medications   No medications on file

## 2013-08-22 ENCOUNTER — Telehealth: Payer: Self-pay | Admitting: Pulmonary Disease

## 2013-08-22 NOTE — Telephone Encounter (Signed)
Pt's daughter is aware that we do not have a flu shot documented. She is going to bring her in through the allergy lab for this.

## 2013-10-12 DIAGNOSIS — H353190 Nonexudative age-related macular degeneration, unspecified eye, stage unspecified: Secondary | ICD-10-CM | POA: Diagnosis not present

## 2013-10-12 DIAGNOSIS — H35379 Puckering of macula, unspecified eye: Secondary | ICD-10-CM | POA: Diagnosis not present

## 2013-10-12 DIAGNOSIS — H31019 Macula scars of posterior pole (postinflammatory) (post-traumatic), unspecified eye: Secondary | ICD-10-CM | POA: Diagnosis not present

## 2013-10-12 DIAGNOSIS — H43819 Vitreous degeneration, unspecified eye: Secondary | ICD-10-CM | POA: Diagnosis not present

## 2013-10-28 ENCOUNTER — Telehealth: Payer: Self-pay | Admitting: Pulmonary Disease

## 2013-10-28 MED ORDER — PREDNISONE (PAK) 5 MG PO TABS: ORAL_TABLET | ORAL | Status: DC

## 2013-10-28 NOTE — Telephone Encounter (Signed)
Pt's daughter [name] says she can be reached at [phone].Elnita Maxwell

## 2013-10-28 NOTE — Telephone Encounter (Signed)
Called and spoke with pts daughter and she stated that the pt has started on her 2nd round of zpak.  Daughter stated that the pt has lots of clear congestion, just does not feel good with no energy.  She has been using the tussionex and is almost out of this.  She has started on mucinex and this is still not helping.  She denies any body aches or fever.  Daughter wanted to know if SN thought steroids would help her.   SN please advise. Thanks  No Known Allergies   Current Outpatient Prescriptions on File Prior to Visit  Medication Sig Dispense Refill  . albuterol (PROVENTIL HFA;VENTOLIN HFA) 108 (90 BASE) MCG/ACT inhaler 1-2 puffs every 4 hours as needed  1 Inhaler  11  . ALPRAZolam (XANAX) 0.5 MG tablet Take 1/2 to 1 tablet by mouth three times daily as needed for nerves--not to exceed 3 per day  90 tablet  5  . aspirin 81 MG tablet Take 81 mg by mouth daily.        Marland Kitchen azithromycin (ZITHROMAX) 250 MG tablet Take 1 tablet (250 mg total) by mouth daily. As needed use  6 tablet  1  . Calcium Carbonate-Vitamin D (CALTRATE 600+D) 600-400 MG-UNIT per tablet Take 1 tablet by mouth daily.        . Cholecalciferol (VITAMIN D) 1000 UNITS capsule Take 1,000 Units by mouth daily.        Marland Kitchen escitalopram (LEXAPRO) 10 MG tablet Take 1 tablet (10 mg total) by mouth daily.  90 tablet  3  . HYDROcodone-acetaminophen (VICODIN) 5-500 MG per tablet Take 1 tablet by mouth every 6 (six) hours as needed for pain.       . simvastatin (ZOCOR) 20 MG tablet Take 1 tablet (20 mg total) by mouth at bedtime.  90 tablet  3  . vitamin C (ASCORBIC ACID) 500 MG tablet Take 500 mg by mouth daily.       No current facility-administered medications on file prior to visit.

## 2013-10-28 NOTE — Telephone Encounter (Signed)
Per SN---  Ok to call in prednisone dosepak  5 mg  6 day pack.  Called and spoke with pts daughter and she is aware of meds sent to the pharmacy.  Nothing further is needed.

## 2013-11-07 ENCOUNTER — Ambulatory Visit (INDEPENDENT_AMBULATORY_CARE_PROVIDER_SITE_OTHER): Payer: Medicare Other | Admitting: Cardiovascular Disease

## 2013-11-07 ENCOUNTER — Encounter: Payer: Self-pay | Admitting: Cardiovascular Disease

## 2013-11-07 VITALS — BP 130/62 | HR 77 | Ht 64.5 in | Wt 148.2 lb

## 2013-11-07 DIAGNOSIS — R42 Dizziness and giddiness: Secondary | ICD-10-CM | POA: Diagnosis not present

## 2013-11-07 DIAGNOSIS — I2510 Atherosclerotic heart disease of native coronary artery without angina pectoris: Secondary | ICD-10-CM

## 2013-11-07 DIAGNOSIS — R079 Chest pain, unspecified: Secondary | ICD-10-CM

## 2013-11-07 DIAGNOSIS — R011 Cardiac murmur, unspecified: Secondary | ICD-10-CM | POA: Diagnosis not present

## 2013-11-07 DIAGNOSIS — R0602 Shortness of breath: Secondary | ICD-10-CM | POA: Diagnosis not present

## 2013-11-07 DIAGNOSIS — F411 Generalized anxiety disorder: Secondary | ICD-10-CM

## 2013-11-07 DIAGNOSIS — E785 Hyperlipidemia, unspecified: Secondary | ICD-10-CM

## 2013-11-07 MED ORDER — SIMVASTATIN 20 MG PO TABS: 20.0000 mg | ORAL_TABLET | Freq: Every day | ORAL | Status: DC

## 2013-11-07 NOTE — Progress Notes (Signed)
Patient ID: Jessica [name], female    DOB: [birth year], 80 y.o.   MRN: [id number]  HPI Comments: [name]  is an 80 y/o woman with h/o HL, fibromyalgia, anxiety and COPD, asthmatic bronchitis Previous smoking history but stopped over 30 years ago. History of total knee replacement July 2013 by her report Mild gait instability, walks with a cane. Previous history of falls with trauma to her face/forehead requiring stitches. No recent falls  In followup today, she reports that she has periodic anxiety and in this setting has episodes of shortness of breath. She denies having shortness of breath with exertion. She is very active, continues to work in Production assistant, radio working full time. Reports that she is helping to take care of 2 grandchildren.   Previous trip to the ER with CP in 8/11.  Had post hospital Myoview in 9/11 with normal EF and question of inferior ischemia.   medical therapy pursued at that time. Asymptomatic since then  No recurrent CP or undue dyspnea. Does get fatigued. No palpitations, CHF or syncope.     EKG shows normal sinus rhythm with rate 77 beats per minute, no significant ST or T wave changes     Outpatient Encounter Prescriptions as of 11/07/2013  Medication Sig  . albuterol (PROVENTIL HFA;VENTOLIN HFA) 108 (90 BASE) MCG/ACT inhaler 1-2 puffs every 4 hours as needed  . ALPRAZolam (XANAX) 0.5 MG tablet Take 1/2 to 1 tablet by mouth three times daily as needed for nerves--not to exceed 3 per day  . aspirin 81 MG tablet Take 81 mg by mouth daily.    Marland Kitchen azithromycin (ZITHROMAX) 250 MG tablet Take 1 tablet (250 mg total) by mouth daily. As needed use  . Calcium Carbonate-Vitamin D (CALTRATE 600+D) 600-400 MG-UNIT per tablet Take 1 tablet by mouth daily.    . Cholecalciferol (VITAMIN D) 1000 UNITS capsule Take 1,000 Units by mouth daily.    Marland Kitchen escitalopram (LEXAPRO) 10 MG tablet Take 1 tablet (10 mg total) by mouth daily.  Marland Kitchen HYDROcodone-acetaminophen (VICODIN) 5-500  MG per tablet Take 1 tablet by mouth every 6 (six) hours as needed for pain.   . simvastatin (ZOCOR) 20 MG tablet Take 1 tablet (20 mg total) by mouth at bedtime.  . vitamin C (ASCORBIC ACID) 500 MG tablet Take 500 mg by mouth daily.  . [DISCONTINUED] predniSONE (STERAPRED UNI-PAK) 5 MG TABS tablet Take as directed    Review of Systems  Constitutional: Negative.   HENT: Negative.   Eyes: Negative.   Respiratory: Negative.   Cardiovascular: Negative.   Gastrointestinal: Negative.   Endocrine: Negative.   Musculoskeletal: Negative.   Skin: Negative.   Allergic/Immunologic: Negative.   Neurological: Negative.   Hematological: Negative.   Psychiatric/Behavioral: Negative.   All other systems reviewed and are negative.    BP 130/62  Pulse 77  Ht 5' 4.5" (1.638 m)  Wt 148 lb 4 oz (67.246 kg)  BMI 25.06 kg/m2  Physical Exam  Nursing note and vitals reviewed. Constitutional: She is oriented to person, place, and time. She appears well-developed and well-nourished.  HENT:  Head: Normocephalic.  Nose: Nose normal.  Mouth/Throat: Oropharynx is clear and moist.  Eyes: Conjunctivae are normal. Pupils are equal, round, and reactive to light.  Neck: Normal range of motion. Neck supple. No JVD present.  Cardiovascular: Normal rate, regular rhythm, S1 normal, S2 normal and intact distal pulses.  Exam reveals no gallop and no friction rub.   Murmur heard.  Systolic murmur is  present with a grade of 2/6  Pulmonary/Chest: Effort normal and breath sounds normal. No respiratory distress. She has no wheezes. She has no rales. She exhibits no tenderness.  Abdominal: Soft. Bowel sounds are normal. She exhibits no distension. There is no tenderness.  Musculoskeletal: Normal range of motion. She exhibits no edema and no tenderness.  Lymphadenopathy:    She has no cervical adenopathy.  Neurological: She is alert and oriented to person, place, and time. Coordination normal.  Skin: Skin is warm and  dry. No rash noted. No erythema.  Psychiatric: She has a normal mood and affect. Her behavior is normal. Judgment and thought content normal.    Assessment and Plan

## 2013-11-07 NOTE — Assessment & Plan Note (Signed)
Cholesterol is at goal on the current lipid regimen. No changes to the medications were made.  

## 2013-11-07 NOTE — Assessment & Plan Note (Signed)
No further episodes of chest pain on today's visit.

## 2013-11-07 NOTE — Assessment & Plan Note (Signed)
Currently with no symptoms of angina. No further workup at this time. Continue current medication regimen. 

## 2013-11-07 NOTE — Assessment & Plan Note (Signed)
Low-grade murmur on exam. Suspect aortic valve sclerosis, possible stenosis, mild

## 2013-11-07 NOTE — Assessment & Plan Note (Signed)
Periodic episodes of anxiety with associated shortness of breath. She has Xanax on her medication list. Does not seem to be consistent with cardiac issue or angina as she is otherwise active with no symptoms. No further testing at this time

## 2013-11-07 NOTE — Patient Instructions (Signed)
You are doing well. No medication changes were made.  Please call us if you have new issues that need to be addressed before your next appt.  Your physician wants you to follow-up in: 12 months.  You will receive a reminder letter in the mail two months in advance. If you don't receive a letter, please call our office to schedule the follow-up appointment. 

## 2013-11-23 DIAGNOSIS — H353290 Exudative age-related macular degeneration, unspecified eye, stage unspecified: Secondary | ICD-10-CM | POA: Diagnosis not present

## 2013-11-23 DIAGNOSIS — H04129 Dry eye syndrome of unspecified lacrimal gland: Secondary | ICD-10-CM | POA: Diagnosis not present

## 2014-01-18 ENCOUNTER — Encounter: Payer: Self-pay | Admitting: Pulmonary Disease

## 2014-01-18 ENCOUNTER — Ambulatory Visit (INDEPENDENT_AMBULATORY_CARE_PROVIDER_SITE_OTHER): Payer: Medicare Other | Admitting: Pulmonary Disease

## 2014-01-18 ENCOUNTER — Other Ambulatory Visit (INDEPENDENT_AMBULATORY_CARE_PROVIDER_SITE_OTHER): Payer: Medicare Other

## 2014-01-18 VITALS — BP 110/70 | HR 78 | Temp 97.3°F | Ht 65.0 in | Wt 145.8 lb

## 2014-01-18 DIAGNOSIS — I872 Venous insufficiency (chronic) (peripheral): Secondary | ICD-10-CM

## 2014-01-18 DIAGNOSIS — M545 Low back pain, unspecified: Secondary | ICD-10-CM

## 2014-01-18 DIAGNOSIS — E559 Vitamin D deficiency, unspecified: Secondary | ICD-10-CM

## 2014-01-18 DIAGNOSIS — IMO0001 Reserved for inherently not codable concepts without codable children: Secondary | ICD-10-CM | POA: Diagnosis not present

## 2014-01-18 DIAGNOSIS — E785 Hyperlipidemia, unspecified: Secondary | ICD-10-CM | POA: Diagnosis not present

## 2014-01-18 DIAGNOSIS — F411 Generalized anxiety disorder: Secondary | ICD-10-CM

## 2014-01-18 DIAGNOSIS — I2510 Atherosclerotic heart disease of native coronary artery without angina pectoris: Secondary | ICD-10-CM

## 2014-01-18 DIAGNOSIS — R413 Other amnesia: Secondary | ICD-10-CM

## 2014-01-18 DIAGNOSIS — M1990 Unspecified osteoarthritis, unspecified site: Secondary | ICD-10-CM | POA: Diagnosis not present

## 2014-01-18 LAB — TSH: TSH: 1.67 u[IU]/mL (ref 0.35–5.50)

## 2014-01-18 LAB — CBC WITH DIFFERENTIAL/PLATELET
BASOS ABS: 0 10*3/uL (ref 0.0–0.1)
Basophils Relative: 0.4 % (ref 0.0–3.0)
EOS ABS: 0 10*3/uL (ref 0.0–0.7)
Eosinophils Relative: 0.6 % (ref 0.0–5.0)
HEMATOCRIT: 37.8 % (ref 36.0–46.0)
Hemoglobin: 12.6 g/dL (ref 12.0–15.0)
LYMPHS ABS: 1.1 10*3/uL (ref 0.7–4.0)
Lymphocytes Relative: 21.7 % (ref 12.0–46.0)
MCHC: 33.3 g/dL (ref 30.0–36.0)
MCV: 91.8 fl (ref 78.0–100.0)
MONO ABS: 0.4 10*3/uL (ref 0.1–1.0)
MONOS PCT: 7.3 % (ref 3.0–12.0)
Neutro Abs: 3.5 10*3/uL (ref 1.4–7.7)
Neutrophils Relative %: 70 % (ref 43.0–77.0)
PLATELETS: 278 10*3/uL (ref 150.0–400.0)
RBC: 4.12 Mil/uL (ref 3.87–5.11)
RDW: 14.9 % — AB (ref 11.5–14.6)
WBC: 5 10*3/uL (ref 4.5–10.5)

## 2014-01-18 LAB — HEPATIC FUNCTION PANEL
ALBUMIN: 4.1 g/dL (ref 3.5–5.2)
ALK PHOS: 101 U/L (ref 39–117)
ALT: 13 U/L (ref 0–35)
AST: 22 U/L (ref 0–37)
Bilirubin, Direct: 0.1 mg/dL (ref 0.0–0.3)
Total Bilirubin: 0.7 mg/dL (ref 0.3–1.2)
Total Protein: 7 g/dL (ref 6.0–8.3)

## 2014-01-18 LAB — LIPID PANEL
Cholesterol: 168 mg/dL (ref 0–200)
HDL: 53.4 mg/dL (ref >39.00)
LDL Cholesterol: 101 mg/dL — ABNORMAL HIGH (ref 0–99)
Total CHOL/HDL Ratio: 3
Triglycerides: 69 mg/dL (ref 0.0–149.0)
VLDL: 13.8 mg/dL (ref 0.0–40.0)

## 2014-01-18 LAB — BASIC METABOLIC PANEL
BUN: 12 mg/dL (ref 6–23)
CO2: 27 mEq/L (ref 19–32)
Calcium: 9.6 mg/dL (ref 8.4–10.5)
Chloride: 105 mEq/L (ref 96–112)
Creatinine, Ser: 0.8 mg/dL (ref 0.4–1.2)
GFR: 73.34 mL/min (ref >60.00)
GLUCOSE: 84 mg/dL (ref 70–99)
POTASSIUM: 3.8 meq/L (ref 3.5–5.1)
SODIUM: 138 meq/L (ref 135–145)

## 2014-01-18 MED ORDER — ALPRAZOLAM 0.5 MG PO TABS: ORAL_TABLET | ORAL | Status: DC

## 2014-01-18 MED ORDER — ESCITALOPRAM OXALATE 20 MG PO TABS: 20.0000 mg | ORAL_TABLET | Freq: Every day | ORAL | Status: DC

## 2014-01-18 MED ORDER — SIMVASTATIN 20 MG PO TABS
20.0000 mg | ORAL_TABLET | Freq: Every day | ORAL | Status: DC
Start: 2014-01-18 — End: 2015-02-26

## 2014-01-18 MED ORDER — AZITHROMYCIN 250 MG PO TABS: 250.0000 mg | ORAL_TABLET | Freq: Every day | ORAL | Status: DC

## 2014-01-18 MED ORDER — ALBUTEROL SULFATE HFA 108 (90 BASE) MCG/ACT IN AERS: INHALATION_SPRAY | RESPIRATORY_TRACT | Status: DC

## 2014-01-18 NOTE — Patient Instructions (Signed)
Today we updated your med list in our EPIC system...    Continue your current medications the same...  We decided to increase the Lexapro to 20mg  daily...  Today we did your follow up FASTING blood work...    We will contact you w/ the results when available...   Call for any questions or if we can be of service in any way.Marland KitchenMarland Kitchen

## 2014-01-18 NOTE — Progress Notes (Signed)
Subjective:    Patient ID: Jessica [name], female    DOB: August 24, 1926, 80 y.o.   MRN: [id number]  HPI 80 y/o WF here for a follow up visit... she has multiple medical problems as noted below...    ~  January 14, 2012:  125moROV & HTaleahtells me that she [facility]- still having some pain but improved;  She wants uKoreato refill her ASharin Gravetoday...    Hx Asthmatic Bronchitis> she denies recent exac and is not taking any regular breathing meds; she knows to avoid infections etc...    Hx Atyp CP> she had Cards eval 2011 by DrBensimhon w/ ?abn myoview & she opted for conservative approach & med Rx- ASA, Simva20, risk factor reduction strategy; states she is doing well & denies recent CP, palpit, ch in SOB, etc...    CHOL> on Simva20 + diet efforts w/ FLP showing TChol 163, TG 55, HDL 65, LDL 87    DJD> [facility]; [facility] she also sees DrDeveshwar> on Colchicine 0.622md, Flexeril prn, Vicodin prn...    Hx LBP> she's had remote LLam & doing satis w/ daily exercises (reminded to continue exercise etc)...    FM, Psuedogout> followed by DrDeveshwar on meds above...    Vit D Defic> her Vit D level has improved on her 1000u OTC supplement daily..    Memory Loss, Lacunar Infarct> on ASA daily, MRI in 2008 showed old lacunar infarct, sm vessel dis & atrophy...    Anxiety/ Depression> on Xanax & Lexapro; improved she says & confirmed by family, continue same meds...  CXR 3/13showed stable heart size, clear lungs w/ some hyperinflation, right humeral prosthesis, DJD in spine...  LABS 3/12:  FLP- at goals on Simva20;  Chems- wnl;  CBC- wnl;  TSH=1.40  ~  July 16, 2012:  80moV & [name] reports doing well- no new complaints or concerns... In the interval she has seen DrWolicki for cerumen impaction, otherw no other acute medical  issues... Her FLP looks good on the Simva20;  She reports doing well on Lexapro & Xanax...    We reviewed prob list, meds, xrays and labs> see below for updates >> OK Flu vaccine today & Rx written for Shingles vaccine.  ~  January 13, 2013:  80mo & HeleJenniahstable, doing well, and denies new complaints or concerns; We reviewed the following medical problems during today's office visit >>     Hx Asthmatic Bronchitis> on Tussionex prn; she denies recent exac and is not taking any regular breathing meds; she knows to avoid infections etc...    Hx AtypCP> on ASA81; she had Cards eval 2011 by DrBensimhon w/ ?abn myoview & she opted for conservative approach & med Rx- ASA, Simva20, risk factor reduction strategy; states she is doing well & denies recent CP, palpit, ch in SOB, etc...    CHOL> on Simva20 + diet efforts w/ FLP showing TChol 165, TG 68, HDL 51, LDL 100    DJD> [facility]; [facility]> on Colchicine 0.25mg/4mFlexeril prn, Vicodin prn...    Hx LBP> she's had remote LLam & doing satis w/  daily exercises (reminded to continue exercise etc)...    FM, Psuedogout> followed by DrDeveshwar on meds above...    Vit D Defic> her Vit D level = 33 on 1000u daily; rec to incr to 2000u per day...    Memory Loss, Lacunar Infarct> on ASA daily, MRI in 2008 showed old lacunar infarct, sm vessel dis & atrophy...    Anxiety/ Depression> on Xanax0.5 & Lexapro10; improved she says & confirmed by family, continue same meds... We reviewed prob list, meds, xrays and labs> see below for updates >> she had Flu vaccine 10/13, & we wrote Rx for Shingles vax...  CXR 3/14 showed normal heart size, clear lungs, calcifAo, right shoulder prosthesis, DJD in Tspine...  LABS 3/14>  FLP- at goals on Simva20;  Chems- wnl;  CBC- wnl w/ Hg=12.6;  TSH= 1.04;  VitD=33...   ~  July 20, 2013:  78moROV & HCambreighhas had a good interval- stable w/o signif new complaints or concerns...    She has HxAB on no regular meds; she likes to keep ZPak & Tussionex on hand, but not on regular inhalers and no exac in the interval...    Hx AtypCP and conserv Rx from Cards; doing satis w/o CP, palpit, SOB, etc but needs to be more active, incr exercise, etc...    Chol is well regulated w/ Simva20 + diet, weight is 152# and stable...    Hx DJD, LBP, FM, pseudogout> on Colchicine 0.673md, Flexeril prn, Vicodin prn & followed by DrLadene Artistshe has had numerous [facility]...    Memory loss, Hx lacunar infarct, anxiety, depression> on ASA 81 + Xanax & Lexapro; stablr w/o cerebral ischemic symptoms... We reviewed prob list, meds, xrays and labs> see below for updates >> OK 2014 Flu vaccine today...  ~  January 18, 2014:  80moV & Holley reports macular degen w/ 6 shots in left eye by DrSanders, she is improved she says; daugh feels she is still depressed "I worry too much" & they want to increase her Lexapro from 27m63mto 20mg63mOK... We reviewed the following medical problems during today's office visit >>     Hx Asthmatic Bronchitis> on ProairHFA & Tussionex prn; she denies recent exac and is not taking any regular breathing meds; she knows to avoid infections etc...    Hx AtypCP> on ASA81; she had Cards eval 2011 by DrBensimhon w/ ?abn myoview & she opted for conservative approach & med Rx- ASA, Simva20, risk factor reduction strategy; states she is doing well & denies recent CP, palpit, ch in SOB, etc...    CHOL> on Simva20 + diet efforts w/ FLP 3/15 showing TChol 168, TG 69, HDL 53, LDL 101    DJD> [facility]; [facility] also sees DrDeveshwar> on Vicodin prn (but off prev colchicine & flexeril)...    Hx LBP> she's had remote LLam & doing satis w/  daily exercises (reminded to continue exercise etc)...    FM, Psuedogout> followed by DrDeveshwar on meds above...    Vit D Defic> her Vit D level = 47 on 1000u daily...    Memory Loss, Lacunar Infarct> on ASA daily, MRI in 2008 showed old lacunar infarct, sm vessel dis & atrophy...    Anxiety/ Depression> on Xanax0.5 & Lexapro10 but daugh wants to incr Lexapro to 20mg/69mk... We  reviewed prob list, meds, xrays and labs> see below for updates >> All meds refilled today per request...  LABS 3/15:  FLP- at goals on simva20;  Chems- wnl;  CBC- ok w/ Hg=12.6;  TSH=1.67;  VitD=47...            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 she states doing well- just wants a ZPak for Prn use. ~  she is an ex-smoker having smoked from age 53 to 9 up to 2ppd, "but I just puffed, didn't inhale" ~  baseline CXR 8/08 showed clear lungs x scarring left base & DJD left shoulder... ~  PFT's 11/05 showed FVC= 2.21 (81%), FEV1=1.35 (68%), %1sec=61, mid-flows=30%pred... ~  CT Angio Chest 8/11 showed 7m nodule RLL w/ fu CT in 676moec... ~  12/11:  she had acute exac Rx w/ Augmentin, Depo/ Dosepak, Mucinex, etc... ~  3/12: f/u CTChest showed stable 42m101mUL nodule, some scarring & centrilob emphysema, coronary calcif is seen... ~  CXR 3/13 showed stable heart size, clear lungs w/ some hyperinflation, right humeral prosthesis, DJD in spine... ~  CXR 3/14 showed normal heart size, clear lungs, calcifAo, right shoulder prosthesis, DJD in Tspine.  Hx of CHEST PAIN  (ICD-786.50) < SEE 8/17-19/11 HOSP > EKG, Enz, 2DEcho, CTA... subseq outpt Myoview showed ?ischemia, norm EF; eval by DrBensimhon w/ option for med rx vs cath & she chose med rx w/ ASA, Simva20... ~  EKG 1/12 showed NSR, rate61, poor r prog V1-2, NAD...  ~  EKG 3/14 showed NSR, rate71, poor r progression V1-3,  NAD...  VENOUS INSUFFICIENCY (ICD-459.81) - VI changes without swelling... prev tinea pedis on right foot betw 4th-5th toes has resolved w/ Lotrisone cream...  HYPERCHOLESTEROLEMIA, BORDERLINE (ICD-272.4) - on SIMVASTATIN 68m80m+ diet rx now... ~  FLP 2/10 showed TChol 179, TG 108, HDL 41, LDL 117... rec> better diet, incr exerc. ~  10/11:  started on Simva20 & she is due for f/u FLP- asked to ret fasting for this lab. ~  FLP Deep Water2 on Simva20 showed TChol 183, TG 110, HDL 43, LDL 118... She declined incr dose, therefore needs better diet. ~  FLP 3/13 on Simva20 showed TChol 163, TG 55, HDL 65, LDL 87 ~  FLP 3/14 on simva20 showed TChol 165, TG 68, HDL 51, LDL 100   UTI'S, HX OF (ICD-V13.00) - CT Angio chest 8/11 in hosp also showed ? hydronephrosis vs parapelvic cysts> subseq CT Abd confirmed cysts...  DEGENERATIVE JOINT DISEASE (ICD-715.90) - she is followed by DrDeveshwar for Rheum w/ Pseudogout (CPPD) on COLCHICINE 0.6mg/78mwhen she can afford it); [facility]/ [facility]... also followed by DrKramer who treated her left hand fracture after a fall last yr & has injected her knees...  she takes VICODIN Prn, FLEXERIL Prn, & COLCHICINE 0.6mg/d3m ~  7/10:  [facility]:  c/o right knee pain which she thinks is "gout" & she will f/u w/ DrKramer- severe DJD & needs TKR... ~  [facility]  [facility] ~  [facility]  LOW BACK PAIN SYNDROME (ICD-724.2) - she had prev Lumbar Lam & known DDD... she uses VICODIN and FLEXERIL as needed... she states that she is stable without new complaints or concerns...  FIBROMYALGIA (  ICD-729.1) - followed by Ladene Artist and her notes are reviewed...  VITAMIN D DEFICIENCY (ICD-268.9) - on Vit D 1000 u OTC daily... ~  labs 8/09 showed Vit D level = 27... rec> start OTC Vit D supplement. ~  labs 9/10 showed Vit D level = 24... rec> take Vit D  1000 u daily. ~  labs 3/12 showed Vit D level = 41... continue 1000 u daily. ~  Labs 3/14 showed Vit D level = 33... rec to incr Vit D supplement to 2000u.  MEMORY LOSS (ICD-780.93) - she notes poor memory and prev MRIBrain 11/08 showed old lacune in right thalamus, sm vessel dis, atrophy... she takes ASA 48m/d...  Hx of VERTIGO (ICD-780.4) - uses Meclizine as needed...  ANXIETY (ICD-300.00) - she takes LEXAPRO 176md and ALPRAZOLAM 0.42m80md Prn (both of which help her mood and anxiety)...  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   Past Surgical History  Procedure Laterality Date  . Cataract extraction    . Abdominal hysterectomy    . Anterior cervical discectomy    . Lumbar laminectomy    . Right knee arthroscopy    . Right shoulder surgery  2008    Dr. WaiNoemi Chapel Right shoulder replacement  04/2009  . Carpal tunnel release  12/2011    right arm    Outpatient Encounter Prescriptions as of 01/18/2014  Medication Sig  . albuterol (PROVENTIL HFA;VENTOLIN HFA) 108 (90 BASE) MCG/ACT inhaler 1-2 puffs every 4 hours as needed  . ALPRAZolam (XANAX) 0.5 MG tablet Take 1/2 to 1 tablet by mouth three times daily as needed for nerves--not to exceed 3 per day  . aspirin 81 MG tablet Take 81 mg by mouth daily.    . Calcium Carbonate-Vitamin D (CALTRATE 600+D) 600-400 MG-UNIT per tablet Take 1 tablet by mouth daily.    . Cholecalciferol (VITAMIN D) 1000 UNITS capsule Take 1,000 Units by mouth daily.    . eMarland Kitchencitalopram (LEXAPRO) 10 MG tablet Take 1 tablet (10 mg total) by mouth daily.  . HMarland KitchenDROcodone-acetaminophen (VICODIN) 5-500 MG per tablet Take 1 tablet by mouth every 6 (six) hours as needed for pain.   . simvastatin (ZOCOR) 20 MG tablet Take 1 tablet (20 mg total) by mouth at bedtime.  . vitamin C (ASCORBIC ACID) 500 MG tablet Take 500 mg by mouth daily.  . [DISCONTINUED] azithromycin (ZITHROMAX) 250 MG tablet Take 1 tablet (250 mg total) by  mouth daily. As needed use    No Known Allergies   Current Medications, Allergies, Past Medical History, Past Surgical History, Family History, and Social History were reviewed in [employer].    Review of Systems         See HPI - all other systems neg except as noted...  The patient complains of dyspnea on exertion and difficulty walking.  The patient denies anorexia, fever, weight loss, weight gain, vision loss, decreased hearing, hoarseness, chest pain, syncope, peripheral edema, prolonged cough, headaches, hemoptysis, abdominal pain, melena, hematochezia, severe indigestion/heartburn, hematuria, incontinence, muscle weakness, suspicious skin lesions, transient blindness, depression, unusual weight change, abnormal bleeding, enlarged lymph nodes, and angioedema.     Objective:   Physical Exam     WD, WN, [age] WF in NAD... GENERAL:  Alert & oriented; pleasant & cooperative... HEENT:  [place]/AT, EOM-wnl, PERRLA w/ nystagmus, EACs-clear, TMs-wnl, NOSE-clear, THROAT-clear & wnl. NECK:  Supple w/ decr ROM; no JVD; normal carotid impulses w/o bruits; no  thyromegaly or nodules palpated; no lymphadenopathy. CHEST:  Clear to P & A; without wheezes/ rales/ or rhonchi. HEART:  Regular Rhythm; without murmurs/ rubs/ or gallops. ABDOMEN:  Soft & nontender; normal bowel sounds; no organomegaly or masses detected. EXT: without deformities, mild arthritic changes; no varicose veins/ +venous insuffic/ tr edema. right shoulder well healed surgical scar & can raise right arm mid level... NEURO:  CN's intact; she has congenital nystagmus, no focal neuro deficits... DERM:  No lesions noted; no rash etc...  RADIOLOGY DATA:  Reviewed in the EPIC EMR & discussed w/ the patient...  LABORATORY DATA:  Reviewed in the EPIC EMR & discussed w/ the patient...    Assessment & Plan:    Hx Asthmatic Bronchitis>  Stable & not taking any regular breathing meds; she knows to avoid  infections etc; CXR clear, NAD...     Hx Atyp CP>  Cards eval by DrBensimhon 2011 w/ ?abn myoview & she opted for conservative approach & med Rx- ASA, Simva20, risk factor reduction strategy.     CHOL>  on Simva20 + diet efforts w/ FLP looks good w/ parameters at goal...     DJD>  s[facility] etc; she is pleased w/ results; [facility]; taking Vicodin prn pain...     Hx LBP>  she's had remote LLam & doing satis w/ daily exercises (reminded to continue exercise etc)...     FM, Psuedogout>  followed by DrDeveshwar on meds above + she's topped her prev Colchicine & Flexeril Rx...     Vit D Defic>  her Vit D level = 47 on the 1000u daily...     Memory Loss, Lacunar Infarct>  on ASA daily, MRI in 2008 showed old lacunar infarct, sm vessel dis & atrophy...     Anxiety/ Depression>  on Xanax & Lexapro; OK to incr the Lexapro to 18m/d...   Patient's Medications  New Prescriptions   ESCITALOPRAM (LEXAPRO) 20 MG TABLET    Take 1 tablet (20 mg total) by mouth daily.  Previous Medications   ASPIRIN 81 MG TABLET    Take 81 mg by mouth daily.     CALCIUM CARBONATE-VITAMIN D (CALTRATE 600+D) 600-400 MG-UNIT PER TABLET    Take 1 tablet by mouth daily.     CHOLECALCIFEROL (VITAMIN D) 1000 UNITS CAPSULE    Take 1,000 Units by mouth daily.     HYDROCODONE-ACETAMINOPHEN (VICODIN) 5-500 MG PER TABLET    Take 1 tablet by mouth every 6 (six) hours as needed for pain.    VITAMIN C (ASCORBIC ACID) 500 MG TABLET    Take 500 mg by mouth daily.  Modified Medications   Modified Medication Previous Medication   ALBUTEROL (PROVENTIL HFA;VENTOLIN HFA) 108 (90 BASE) MCG/ACT INHALER albuterol (PROVENTIL HFA;VENTOLIN HFA) 108 (90 BASE) MCG/ACT inhaler      1-2 puffs every 4 hours as needed    1-2 puffs every 4 hours as needed   ALPRAZOLAM (XANAX) 0.5 MG TABLET ALPRAZolam (XANAX) 0.5 MG tablet      Take 1/2 to 1 tablet by mouth three times  daily as needed for nerves--not to exceed 3 per day    Take 1/2 to 1 tablet by mouth three times daily as needed for nerves--not to exceed 3 per day   AZITHROMYCIN (ZITHROMAX) 250 MG TABLET azithromycin (ZITHROMAX) 250 MG tablet      Take 1 tablet (250 mg total) by mouth daily. As  needed use    Take 1 tablet (250 mg total) by mouth daily. As needed use   SIMVASTATIN (ZOCOR) 20 MG TABLET simvastatin (ZOCOR) 20 MG tablet      Take 1 tablet (20 mg total) by mouth at bedtime.    Take 1 tablet (20 mg total) by mouth at bedtime.  Discontinued Medications   ESCITALOPRAM (LEXAPRO) 10 MG TABLET    Take 1 tablet (10 mg total) by mouth daily.

## 2014-01-19 LAB — VITAMIN D 25 HYDROXY (VIT D DEFICIENCY, FRACTURES): Vit D, 25-Hydroxy: 47 ng/mL (ref 30–89)

## 2014-03-06 ENCOUNTER — Telehealth: Payer: Self-pay | Admitting: Pulmonary Disease

## 2014-03-06 NOTE — Telephone Encounter (Signed)
Called and spoke with pts daughter and she stated that she has been coughing with clear sputum, and wheezing at night when she lays down in the bed.  Pt has been taking mucinex daily.  Daughter stated that the pt is very concerned about this and would like to be seen.  appt scheduled for the pt with SN on 5/12/at 10.

## 2014-03-07 ENCOUNTER — Telehealth: Payer: Self-pay | Admitting: Pulmonary Disease

## 2014-03-07 ENCOUNTER — Ambulatory Visit (INDEPENDENT_AMBULATORY_CARE_PROVIDER_SITE_OTHER)
Admission: RE | Admit: 2014-03-07 | Discharge: 2014-03-07 | Disposition: A | Discharge status: Home / Self Care | Payer: Medicare Other | Source: Ambulatory Visit | Attending: Pulmonary Disease | Admitting: Pulmonary Disease

## 2014-03-07 ENCOUNTER — Ambulatory Visit (INDEPENDENT_AMBULATORY_CARE_PROVIDER_SITE_OTHER): Payer: Medicare Other | Admitting: Pulmonary Disease

## 2014-03-07 ENCOUNTER — Encounter: Payer: Self-pay | Admitting: Pulmonary Disease

## 2014-03-07 VITALS — BP 128/80 | HR 68 | Temp 97.5°F | Ht 65.0 in | Wt 143.0 lb

## 2014-03-07 DIAGNOSIS — M545 Low back pain, unspecified: Secondary | ICD-10-CM

## 2014-03-07 DIAGNOSIS — J209 Acute bronchitis, unspecified: Secondary | ICD-10-CM | POA: Diagnosis not present

## 2014-03-07 DIAGNOSIS — M1990 Unspecified osteoarthritis, unspecified site: Secondary | ICD-10-CM

## 2014-03-07 DIAGNOSIS — F411 Generalized anxiety disorder: Secondary | ICD-10-CM

## 2014-03-07 DIAGNOSIS — E559 Vitamin D deficiency, unspecified: Secondary | ICD-10-CM

## 2014-03-07 DIAGNOSIS — I2510 Atherosclerotic heart disease of native coronary artery without angina pectoris: Secondary | ICD-10-CM

## 2014-03-07 DIAGNOSIS — I872 Venous insufficiency (chronic) (peripheral): Secondary | ICD-10-CM

## 2014-03-07 DIAGNOSIS — R413 Other amnesia: Secondary | ICD-10-CM

## 2014-03-07 DIAGNOSIS — E785 Hyperlipidemia, unspecified: Secondary | ICD-10-CM

## 2014-03-07 DIAGNOSIS — J984 Other disorders of lung: Secondary | ICD-10-CM | POA: Diagnosis not present

## 2014-03-07 MED ORDER — LEVALBUTEROL HCL 0.63 MG/3ML IN NEBU
0.6300 mg | INHALATION_SOLUTION | Freq: Once | RESPIRATORY_TRACT | Status: AC
  Administered 2014-03-07: 0.63 mg via RESPIRATORY_TRACT

## 2014-03-07 MED ORDER — ALBUTEROL SULFATE (2.5 MG/3ML) 0.083% IN NEBU: 2.5000 mg | INHALATION_SOLUTION | Freq: Three times a day (TID) | RESPIRATORY_TRACT | Status: DC

## 2014-03-07 MED ORDER — PREDNISONE 20 MG PO TABS: ORAL_TABLET | ORAL | Status: DC

## 2014-03-07 MED ORDER — METHYLPREDNISOLONE ACETATE 80 MG/ML IJ SUSP
80.0000 mg | Freq: Once | INTRAMUSCULAR | Status: AC
  Administered 2014-03-07: 80 mg via INTRAMUSCULAR

## 2014-03-07 MED ORDER — ALBUTEROL SULFATE (2.5 MG/3ML) 0.083% IN NEBU: 2.5000 mg | INHALATION_SOLUTION | Freq: Three times a day (TID) | RESPIRATORY_TRACT | Status: DC | PRN

## 2014-03-07 NOTE — Telephone Encounter (Signed)
Per SN--  Ok to change the rx to read 1 vial via nebulizer TID.  thanks

## 2014-03-07 NOTE — Telephone Encounter (Signed)
Called spoke with Manuela Schwartz from [facility]. Medicare no longer allows PRN to be on nebulizer medications any longer. We sent in order to state 1 vial tid prn. They are wanting okay and will need new RX to state 1 vial TID. Please advise SN thanks

## 2014-03-07 NOTE — Progress Notes (Addendum)
Subjective:    Patient ID: Jessica [name], female    DOB: 02-Nov-1925, 80 y.o.   MRN: [id number]  HPI 80 y/o WF here for a follow up visit... she has multiple medical problems as noted below...    ~  July 16, 2012:  23moROV & Trea reports doing well- no new complaints or concerns... In the interval she has seen DrWolicki for cerumen impaction, otherw no other acute medical issues... Her FLP looks good on the Simva20;  She reports doing well on Lexapro & Xanax...    We reviewed prob list, meds, xrays and labs> see below for updates >> OK Flu vaccine today & Rx written for Shingles vaccine.  ~  January 13, 2013:  664moOV & HeLougenias stable, doing well, and denies new complaints or concerns; We reviewed the following medical problems during today's office visit >>     Hx Asthmatic Bronchitis> on Tussionex prn; she denies recent exac and is not taking any regular breathing meds; she knows to avoid infections etc...    Hx AtypCP> on ASA81; she had Cards eval 2011 by DrBensimhon w/ ?abn myoview & she opted for conservative approach & med Rx- ASA, Simva20, risk factor reduction strategy; states she is doing well & denies recent CP, palpit, ch in SOB, etc...    CHOL> on Simva20 + diet efforts w/ FLP showing TChol 165, TG 68, HDL 51, LDL 100    DJD> [facility] etc; [facility]> on Colchicine 0.35m40m, Flexeril prn, Vicodin prn...    Hx LBP> she's had remote LLam & doing satis w/ daily exercises (reminded to continue exercise etc)...    FM, Psuedogout> followed by DrDeveshwar on meds above...    Vit D Defic> her Vit D level = 33 on 1000u daily; rec to incr to 2000u per day...    Memory Loss, Lacunar Infarct> on ASA daily, MRI in 2008 showed old lacunar infarct, sm vessel dis & atrophy...    Anxiety/ Depression> on Xanax0.5 & Lexapro10; improved she says &  confirmed by family, continue same meds... We reviewed prob list, meds, xrays and labs> see below for updates >> she had Flu vaccine 10/13, & we wrote Rx for Shingles vax...  CXR 3/14 showed normal heart size, clear lungs, calcifAo, right shoulder prosthesis, DJD in Tspine...  LABS 3/14>  FLP- at goals on Simva20;  Chems- wnl;  CBC- wnl w/ Hg=12.6;  TSH= 1.04;  VitD=33...   ~  July 20, 2013:  35mo48mo & Jessica [name] had a good interval- stable w/o signif new complaints or concerns...    She has HxAB on no regular meds; she likes to keep ZPak & Tussionex on hand, but not on regular inhalers and no exac in the interval...    Hx AtypCP and conserv Rx from Cards; doing satis w/o CP, palpit, SOB, etc but needs to be more active, incr exercise, etc...    Chol is well regulated w/ Simva20 + diet, weight is 152# and stable...    Hx DJD, LBP, FM, pseudogout> on Colchicine 0.35mg/45mFlexeril prn, Vicodin prn & followed by DrDevLadene Artist [facility]...    Memory loss, Hx lacunar infarct, anxiety, depression> on ASA 81 + Xanax & Lexapro; stablr w/o cerebral ischemic symptoms... We reviewed prob list, meds, xrays and  labs> see below for updates >> OK 2014 Flu vaccine today...  ~  January 18, 2014:  68moROV & Jessica [name] reports macular degen w/ 6 shots in left eye by DrSanders, she is improved she says; daugh feels she is still depressed "I worry too much" & they want to increase her Lexapro from 156md to 2027m- OK... We reviewed the following medical problems during today's office visit >>     Hx Asthmatic Bronchitis> on ProairHFA & Tussionex prn; she denies recent exac and is not taking any regular breathing meds; she knows to avoid infections etc...    Hx AtypCP> on ASA81; she had Cards eval 2011 by DrBensimhon w/ ?abn myoview & she opted for conservative approach & med Rx- ASA, Simva20, risk factor reduction strategy; states she is doing well & denies recent CP, palpit,  ch in SOB, etc...    CHOL> on Simva20 + diet efforts w/ FLP 3/15 showing TChol 168, TG 69, HDL 53, LDL 101    DJD> [facility]; [facility]> on Vicodin prn (but off prev colchicine & flexeril)...    Hx LBP> she's had remote LLam & doing satis w/ daily exercises (reminded to continue exercise etc)...    FM, Psuedogout> followed by DrDeveshwar on meds above...    Vit D Defic> her Vit D level = 47 on 1000u daily...    Memory Loss, Lacunar Infarct> on ASA daily, MRI in 2008 showed old lacunar infarct, sm vessel dis & atrophy...    Anxiety/ Depression> on Xanax0.5 & Lexapro10 but daugh wants to incr Lexapro to 23m67m ok... We reviewed prob list, meds, xrays and labs> see below for updates >> All meds refilled today per request...  LABS 3/15:  FLP- at goals on simva20;  Chems- wnl;  CBC- ok w/ Hg=12.6;  TSH=1.67;  VitD=47...   ~  Mar 07, 2014:  6wk ROV & add-on requested for 2-3wk hx of SOB; states she had a URI ~3wks ago "bad cold" w/ cough, congestion, yellow sput, & SOB, but she denies f/c/s or CP; she took ZPak x2, and Mucinex OTC, plus her ProairHFA but symptoms have persisted & incr SOB noted; Exam shows bibasilar rhonchi but no conslidation; CXR is clear w/o signs of pneumonia; we gave her a NEB treatment in office & this worked better for her in opening up her bronchial tubes & clearing the mucus; we decided to get her a NEBULIZER w/ Albut2.5mg 74mto Tid, continue the Mucinex 1200mgB53mand give her a Depo80 shot + Pred23mg- 70mapering schedule... She is reminded to use the Alprazolam up to tid as needed for anxiety...   CXR 5/15 showed norm heart size, mild atherosclerotic changes in Ao, clear lungs w/ mild scarring at the bases, right shoulder prosthesis, NAD...           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 she states doing well- just wants a ZPak for Prn use. ~  she is an ex-smoker having smoked from age 45 to 651up t102ppd, "but I just puffed, didn't inhale" ~  baseline CXR 8/08 showed clear lungs x scarring left base & DJD left shoulder... ~  PFT's 11/05 showed FVC= 2.21 (81%), FEV1=1.35 (68%), %  1sec=61, mid-flows=30%pred... ~  CT Angio Chest 8/11 showed 1m nodule RLL w/ fu CT in 613moec... ~  12/11:  she had acute exac Rx w/ Augmentin, Depo/ Dosepak, Mucinex, etc... ~  3/12: f/u CTChest showed stable 7m90mUL nodule, some scarring & centrilob emphysema, coronary calcif is seen... ~  CXR 3/13 showed stable heart size, clear lungs w/ some hyperinflation, right humeral prosthesis, DJD in spine... ~  CXR 3/14 showed normal heart size, clear lungs, calcifAo, right shoulder prosthesis, DJD in Tspine. ~  Presented 5/15 w/ 3wk hx URI, cough, congestion, SOB; AB exac treated w/ ZPak, Mucinex, Depo80, Pred20-3d taper, & switch to NEB w/ Albut tid as needed... ~  CXR 5/15 showed norm heart size, mild atherosclerotic changes in Ao, clear lungs w/ mild scarring at the bases, right shoulder prosthesis, NAD  Hx of CHEST PAIN  (ICD-786.50) < SEE 8/17-19/11 HOSP > EKG, Enz, 2DEcho, CTA... subseq outpt Myoview showed ?ischemia, norm EF; eval by DrBensimhon w/ option for med rx vs cath & she chose med rx w/ ASA, Simva20... ~  EKG 1/12 showed NSR, rate61, poor r prog V1-2, NAD...  ~  EKG 3/14 showed NSR, rate71, poor r progression V1-3, NAD... ~  1/15:  She saw DrGollan for Cards f/u & felt to be stable- no change in meds...  VENOUS INSUFFICIENCY (ICD-459.81) - VI changes without swelling... prev tinea pedis on right foot betw 4th-5th toes has resolved w/ Lotrisone cream...  HYPERCHOLESTEROLEMIA, BORDERLINE (ICD-272.4) - on SIMVASTATIN 90m50m+ diet rx now... ~  FLP 2/10 showed TChol 179, TG 108, HDL  41, LDL 117... rec> better diet, incr exerc. ~  10/11:  started on Simva20 & she is due for f/u FLP- asked to ret fasting for this lab. ~  FLP [place] on Simva20 showed TChol 183, TG 110, HDL 43, LDL 118... She declined incr dose, therefore needs better diet. ~  FLP 3/13 on Simva20 showed TChol 163, TG 55, HDL 65, LDL 87 ~  FLP 3/14 on Simva20 showed TChol 165, TG 68, HDL 51, LDL 100  ~  FLP 3/15 on Simva20 showed TChol 168, TG 69, HDL 53, LDL 101  UTI'S, HX OF (ICD-V13.00) - CT Angio chest 8/11 in hosp also showed ? hydronephrosis vs parapelvic cysts> subseq CT Abd confirmed cysts...  DEGENERATIVE JOINT DISEASE (ICD-715.90) - she is followed by DrDeveshwar for Rheum w/ Pseudogout (CPPD) on COLCHICINE 0.6mg/67mwhen she can afford it); [facility]/ [facility] also followed by DrKramer who treated her left hand fracture after a fall last yr & has injected her knees...  she takes VICODIN Prn, FLEXERIL Prn, & COLCHICINE 0.6mg/d28m ~  7/10:  [facility]/p [facility]plasty at [facility]/11:  c/o right knee pain which she thinks is "gout" & she will f/u w/ DrKramer- severe DJD & needs TKR... ~  [facility]  [facility] ~  [facility]  LOW BACK PAIN SYNDROME (ICD-724.2) - she had prev Lumbar Lam & known DDD... she uses VICODIN and FLEXERIL as needed... she states that she is stable without new complaints or concerns...  FIBROMYALGIA (ICD-729.1) - followed by DrDeveshwar and her notes are reviewed...  VITAMIN D DEFICIENCY (ICD-268.9) - on Vit D 1000 u OTC daily... ~  labs 8/09 showed Vit D level = 27... rec> start OTC Vit D supplement. ~  labs 9/10 showed Vit D level = 24... rec>  take Vit D 1000 u daily. ~  labs 3/12 showed Vit D level = 41... continue 1000 u daily. ~  Labs 3/14 showed Vit D level = 33... rec to incr Vit D supplement to 2000u.  MEMORY LOSS (ICD-780.93) - she notes poor memory and prev MRIBrain  11/08 showed old lacune in right thalamus, sm vessel dis, atrophy... she takes ASA 16m/d...  Hx of VERTIGO (ICD-780.4) - uses Meclizine as needed...  ANXIETY (ICD-300.00) - she takes LEXAPRO 293md and ALPRAZOLAM 0.42m64md Prn (both of which help her mood and anxiety)...   Past Surgical History  Procedure Laterality Date  . Cataract extraction    . Abdominal hysterectomy    . Anterior cervical discectomy    . Lumbar laminectomy    . Right knee arthroscopy    . Right shoulder surgery  2008    Dr. WaiNoemi Chapel Right shoulder replacement  04/2009  . Carpal tunnel release  12/2011    right arm    Outpatient Encounter Prescriptions as of 03/07/2014  Medication Sig  . albuterol (PROVENTIL HFA;VENTOLIN HFA) 108 (90 BASE) MCG/ACT inhaler 1-2 puffs every 4 hours as needed  . ALPRAZolam (XANAX) 0.5 MG tablet Take 1/2 to 1 tablet by mouth three times daily as needed for nerves--not to exceed 3 per day  . aspirin 81 MG tablet Take 81 mg by mouth daily.    . Calcium Carbonate-Vitamin D (CALTRATE 600+D) 600-400 MG-UNIT per tablet Take 1 tablet by mouth daily.    . Cholecalciferol (VITAMIN D) 1000 UNITS capsule Take 1,000 Units by mouth daily.    . eMarland Kitchencitalopram (LEXAPRO) 20 MG tablet Take 1 tablet (20 mg total) by mouth daily.  . HMarland KitchenDROcodone-acetaminophen (VICODIN) 5-500 MG per tablet Take 1 tablet by mouth every 6 (six) hours as needed for pain.   . simvastatin (ZOCOR) 20 MG tablet Take 1 tablet (20 mg total) by mouth at bedtime.  . vitamin C (ASCORBIC ACID) 500 MG tablet Take 500 mg by mouth daily.  . aMarland Kitchenithromycin (ZITHROMAX) 250 MG tablet Take 1 tablet (250 mg total) by mouth daily. As needed use    No Known Allergies   Current Medications, Allergies, Past Medical History, Past Surgical History, Family History, and Social History were reviewed in [employer].    Review of Systems         See HPI - all other systems neg except as noted...  The patient complains  of dyspnea on exertion and difficulty walking.  The patient denies anorexia, fever, weight loss, weight gain, vision loss, decreased hearing, hoarseness, chest pain, syncope, peripheral edema, prolonged cough, headaches, hemoptysis, abdominal pain, melena, hematochezia, severe indigestion/heartburn, hematuria, incontinence, muscle weakness, suspicious skin lesions, transient blindness, depression, unusual weight change, abnormal bleeding, enlarged lymph nodes, and angioedema.     Objective:   Physical Exam     WD, WN, 88 51o WF in NAD... GENERAL:  Alert & oriented; pleasant & cooperative... HEENT:  [place]/AT, EOM-wnl, PERRLA w/ nystagmus, EACs-clear, TMs-wnl, NOSE-clear, THROAT-clear & wnl. NECK:  Supple w/ decr ROM; no JVD; normal carotid impulses w/o bruits; no thyromegaly or nodules palpated; no lymphadenopathy. CHEST:  Clear to P & A; without wheezes/ rales/ or rhonchi. HEART:  Regular Rhythm; without murmurs/ rubs/ or gallops. ABDOMEN:  Soft & nontender; normal bowel sounds; no organomegaly or masses detected. EXT: without deformities, mild arthritic changes; no varicose veins/ +venous insuffic/ tr edema. right shoulder well healed surgical scar & can raise right arm mid level... NEURO:  CN's intact; she has congenital nystagmus, no focal neuro deficits... DERM:  No lesions noted; no rash etc...  RADIOLOGY DATA:  Reviewed in the EPIC EMR & discussed w/ the patient...  LABORATORY DATA:  Reviewed in the EPIC EMR & discussed w/ the patient...    Assessment & Plan:    Hx Asthmatic Bronchitis>  Presents w/ AB axac & hard to shake it off- Rx w/ Depo80, Pred45m- 3d tapering sched, Mucinex 12064mid, and NEBS w/ Albut tid (she just finished ZPak)...      Hx Atyp CP>  Cards eval by DrBensimhon 2011 w/ ?abn myoview & she opted for conservative approach & med Rx- ASA, Simva20, risk factor reduction strategy; [facility]- stable...     CHOL>  on Simva20 + diet efforts w/ FLP looks good  w/ parameters at goal...     DJD>  [facility] etc; she is pleased w/ results; [facility]...     Hx LBP>  she's had remote LLam & doing satis w/ daily exercises (reminded to continue exercise etc)...     FM, Psuedogout>  followed by DrDeveshwar on meds above + she's topped her prev Colchicine & Flexeril Rx...     Vit D Defic>  her Vit D level = 47 on the 1000u daily...     Memory Loss, Lacunar Infarct>  on ASA daily, MRI in 2008 showed old lacunar infarct, sm vessel dis & atrophy...     Anxiety/ Depression>  on Xanax & Lexapro- stable & doing satis per daughter...   Patient's Medications  New Prescriptions   ALBUTEROL (PROVENTIL) (2.5 MG/3ML) 0.083% NEBULIZER SOLUTION    Take 3 mLs (2.5 mg total) by nebulization 3 (three) times daily as needed for wheezing or shortness of breath.   PREDNISONE (DELTASONE) 20 MG TABLET    1 by mouth two times daily x 3 days, 1 by mouth daily x 3 days, 1/2 tablet daily x 3 days, 1/2 tablet every other day until gone.  Previous Medications   ALBUTEROL (PROVENTIL HFA;VENTOLIN HFA) 108 (90 BASE) MCG/ACT INHALER    1-2 puffs every 4 hours as needed   ALPRAZOLAM (XANAX) 0.5 MG TABLET    Take 1/2 to 1 tablet by mouth three times daily as needed for nerves--not to exceed 3 per day   ASPIRIN 81 MG TABLET    Take 81 mg by mouth daily.     AZITHROMYCIN (ZITHROMAX) 250 MG TABLET    Take 1 tablet (250 mg total) by mouth daily. As needed use   CALCIUM CARBONATE-VITAMIN D (CALTRATE 600+D) 600-400 MG-UNIT PER TABLET    Take 1 tablet by mouth daily.     CHOLECALCIFEROL (VITAMIN D) 1000 UNITS CAPSULE    Take 1,000 Units by mouth daily.     ESCITALOPRAM (LEXAPRO) 20 MG TABLET    Take 1 tablet (20 mg total) by mouth daily.   HYDROCODONE-ACETAMINOPHEN (VICODIN) 5-500 MG PER TABLET    Take 1 tablet by mouth every 6 (six) hours as needed for pain.    SIMVASTATIN (ZOCOR) 20 MG  TABLET    Take 1 tablet (20 mg total) by mouth at bedtime.   VITAMIN C (ASCORBIC ACID) 500 MG TABLET    Take 500 mg by mouth daily.  Modified Medications   No medications on file  Discontinued Medications   No medications on file

## 2014-03-07 NOTE — Patient Instructions (Signed)
Today we updated your med list in our EPIC system...    Continue your current medications the same...  Today we did a follow up CXR...    We will contact you w/ the results when available...   We gave you a NEBULIZER treatment & decided to go w/ a home nebulizer machine>    Use this up to 3 times daily as needed...  Today we gave you a Depo shot & a new prescription for Prednisone to take as directed>>    Start w/ one tab twice daily for 3 days...    Then take one tab each AM for 3 days...    Then take 1/2 tab each AM for 3 days...    Then take 1/2 tab every other day til gone...  Continue the MUCINEX 1200mg  (eg- 2 of the 600mg  tabs) twice daily w/ lots of water by mouth...  Call for any questions or if we can be of service in any way.Marland KitchenMarland Kitchen

## 2014-03-07 NOTE — Telephone Encounter (Signed)
RX has been printed and placed on SN cart for signature. Will fax once done. Nothing furthr needed

## 2014-03-08 ENCOUNTER — Ambulatory Visit: Payer: Self-pay | Admitting: Internal Medicine

## 2014-03-09 ENCOUNTER — Telehealth: Payer: Self-pay | Admitting: Pulmonary Disease

## 2014-03-09 NOTE — Telephone Encounter (Signed)
Jessica [name] w/ [facility] called this morning upset stating that pt's rx for Albuterol neb soln needs to be faxed STAT b/c pt is currently out of this medication - message was left as well as faxes sent to the office.  Spoke with apologizing for the delay and confusion - pt's chart states this was done on 5.12.15 Was able to locate a fax from [facility] in [place] will have this signed NOW and faxed  Form signed by SN and faxed back to [place] @ [phone] Otho Bellows back at [phone] to inform her of this - she stated this is a generic fax and requested it be sent to her direct fax at [phone]. This has been done and fax has been received  Will sign off and fax sent to be scanned

## 2014-03-21 ENCOUNTER — Telehealth: Payer: Self-pay | Admitting: Pulmonary Disease

## 2014-03-21 NOTE — Telephone Encounter (Signed)
LMOM x 1 for pt to return call. Need to verify pharmacy that patient is wanting to receive nebs through before order can be finalized for refills.

## 2014-03-22 NOTE — Telephone Encounter (Signed)
lmomtcb x1 

## 2014-03-22 NOTE — Telephone Encounter (Signed)
Patient returning call.  [phone]

## 2014-03-22 NOTE — Telephone Encounter (Signed)
Will forward to [place] per our conversation.

## 2014-03-22 NOTE — Telephone Encounter (Signed)
Called and spoke with pts daughter and she is aware that we are going to keep the pt with [facility].  No other medications or equipment should come from [facility].  The order form for [facility] has been marked to cancel all future orders for the pt and faxed back to them.  pts daugher is aware.

## 2014-04-10 DIAGNOSIS — H35059 Retinal neovascularization, unspecified, unspecified eye: Secondary | ICD-10-CM | POA: Diagnosis not present

## 2014-04-10 DIAGNOSIS — H353290 Exudative age-related macular degeneration, unspecified eye, stage unspecified: Secondary | ICD-10-CM | POA: Diagnosis not present

## 2014-06-06 ENCOUNTER — Telehealth: Payer: Self-pay | Admitting: Pulmonary Disease

## 2014-06-06 MED ORDER — ALBUTEROL SULFATE HFA 108 (90 BASE) MCG/ACT IN AERS
INHALATION_SPRAY | RESPIRATORY_TRACT | Status: DC
Start: 2014-06-06 — End: 2014-06-06

## 2014-06-06 MED ORDER — AZITHROMYCIN 250 MG PO TABS: ORAL_TABLET | ORAL | Status: DC

## 2014-06-06 MED ORDER — ALBUTEROL SULFATE HFA 108 (90 BASE) MCG/ACT IN AERS: INHALATION_SPRAY | RESPIRATORY_TRACT | Status: DC

## 2014-06-06 NOTE — Telephone Encounter (Signed)
Per SN---  Ok to call in zpak #1  Take as directed proair [facility]  Called and spoke with pts daughter and she is aware of meds that have been sent to the pts pharmacy.  Nothing further is needed.

## 2014-06-06 NOTE — Telephone Encounter (Signed)
Spoke with [employer] States that the patient is having incr productive coughing--yellow, wheezing and having increased SOB. Pt is requesting refill of rescue inhaler-- this has been sent to [facility].  Requesting abx for current symptoms.  No Known Allergies  Please advise Dr Lenna Gilford. thanks.

## 2014-06-12 DIAGNOSIS — R42 Dizziness and giddiness: Secondary | ICD-10-CM | POA: Diagnosis not present

## 2014-06-15 DIAGNOSIS — R42 Dizziness and giddiness: Secondary | ICD-10-CM | POA: Diagnosis not present

## 2014-06-17 ENCOUNTER — Encounter: Payer: Self-pay | Admitting: Family Medicine

## 2014-06-17 ENCOUNTER — Ambulatory Visit (INDEPENDENT_AMBULATORY_CARE_PROVIDER_SITE_OTHER): Payer: Medicare Other | Admitting: Family Medicine

## 2014-06-17 VITALS — BP 128/70 | HR 88 | Temp 97.5°F | Wt 138.8 lb

## 2014-06-17 DIAGNOSIS — J209 Acute bronchitis, unspecified: Secondary | ICD-10-CM | POA: Diagnosis not present

## 2014-06-17 DIAGNOSIS — I2510 Atherosclerotic heart disease of native coronary artery without angina pectoris: Secondary | ICD-10-CM | POA: Diagnosis not present

## 2014-06-17 MED ORDER — PREDNISONE 20 MG PO TABS: ORAL_TABLET | ORAL | Status: DC

## 2014-06-17 MED ORDER — AZITHROMYCIN 250 MG PO TABS: ORAL_TABLET | ORAL | Status: DC

## 2014-06-17 NOTE — Assessment & Plan Note (Signed)
New-  Sent in another course of zpack. Prednisone eRx sent as well. Follow up with Dr. Lenna Gilford next week. The patient indicates understanding of these issues and agrees with the plan.

## 2014-06-17 NOTE — Progress Notes (Signed)
Pre visit review using our clinic review tool, if applicable. No additional management support is needed unless otherwise documented below in the visit note. 

## 2014-06-17 NOTE — Patient Instructions (Signed)
Nice to meet you. Take prednisone and zpack as directed. Please call Dr. Lenna Gilford next week.

## 2014-06-17 NOTE — Progress Notes (Signed)
Subjective:   Patient ID: Jessica [name], female    DOB: 1926/06/13, 80 y.o.   MRN: [id number]  Jessica [name] is a pleasant 80 y.o. year old female new to me who presents to weekend clinic today with Cough and Shortness of Breath  on 06/17/2014  HPI:  H/o asthmatic bronchitis/COPD, followed by Dr. Lenna Gilford.  + former smoker  Also has been seeing cardiology for ?CAD, Dr. [name] (most recently on 11/07/13- note reviewed)- suspects AS, no further work up advised at this time.   2 weeks of increased productive cough, congestion. Dr. Lenna Gilford sent in a zpack- finished this last Friday.    She has just not "bounced back." Using albuterol nebulizer.  No fevers.  She is still SOB but mainly just weak.  Current Outpatient Prescriptions on File Prior to Visit  Medication Sig Dispense Refill  . albuterol (PROVENTIL HFA;VENTOLIN HFA) 108 (90 BASE) MCG/ACT inhaler 1-2 puffs every 4 hours as needed  1 Inhaler  3  . albuterol (PROVENTIL) (2.5 MG/3ML) 0.083% nebulizer solution Take 3 mLs (2.5 mg total) by nebulization 3 (three) times daily.  270 mL  3  . ALPRAZolam (XANAX) 0.5 MG tablet Take 1/2 to 1 tablet by mouth three times daily as needed for nerves--not to exceed 3 per day  90 tablet  5  . aspirin 81 MG tablet Take 81 mg by mouth daily.        . Calcium Carbonate-Vitamin D (CALTRATE 600+D) 600-400 MG-UNIT per tablet Take 1 tablet by mouth daily.        . Cholecalciferol (VITAMIN D) 1000 UNITS capsule Take 1,000 Units by mouth daily.        Marland Kitchen escitalopram (LEXAPRO) 20 MG tablet Take 1 tablet (20 mg total) by mouth daily.  30 tablet  4  . HYDROcodone-acetaminophen (VICODIN) 5-500 MG per tablet Take 1 tablet by mouth every 6 (six) hours as needed for pain.       . simvastatin (ZOCOR) 20 MG tablet Take 1 tablet (20 mg total) by mouth at bedtime.  90 tablet  1  . vitamin C (ASCORBIC ACID) 500 MG tablet Take 500 mg by mouth daily.       No current facility-administered medications on file prior  to visit.    No Known Allergies  Past Medical History  Diagnosis Date  . congenital nystagmus   . Acute asthmatic bronchitis   . Venous insufficiency   . Atypical chest pain   . Other and unspecified hyperlipidemia   . DJD (degenerative joint disease)   . Low back pain syndrome   . Fibromyalgia   . Memory loss   . Anxiety     Past Surgical History  Procedure Laterality Date  . Cataract extraction    . Abdominal hysterectomy    . Anterior cervical discectomy    . Lumbar laminectomy    . Right knee arthroscopy    . Right shoulder surgery  2008    Dr. Noemi Chapel  . Right shoulder replacement  04/2009  . Carpal tunnel release  12/2011    right arm    No family history on file.  History   Social History  . Marital Status: Widowed    Spouse Name: N/A    Number of Children: 3  . Years of Education: N/A   Occupational History  .     Social History Main Topics  . Smoking status: Former Smoker -- 2.00 packs/day for 30 years    Types: Cigarettes  Quit date: 10/28/1991  . Smokeless tobacco: Not on file  . Alcohol Use: No  . Drug Use: No  . Sexual Activity: Not on file   Other Topics Concern  . Not on file   Social History Narrative   1 sibling alive age 2   1 sibling alive age 66   1 sibling deceased age 15   1 sibling alive age 79   The PMH, PSH, Social History, Family History, Medications, and allergies have been reviewed in [facility], and have been updated if relevant.     Review of Systems     Objective:    BP 128/70  Pulse 88  Temp(Src) 97.5 F (36.4 C) (Oral)  Wt 138 lb 12 oz (62.937 kg)  SpO2 93%   Physical Exam  Constitutional: She is oriented to person, place, and time. She appears well-developed and well-nourished. No distress.  HENT:  Head: Normocephalic.  Cardiovascular:  Murmur heard. Pulmonary/Chest: No respiratory distress. She has wheezes. She has rales. She exhibits no tenderness.  Musculoskeletal: Normal range of motion.    Neurological: She is alert and oriented to person, place, and time.  Skin: Skin is warm and dry.           Assessment & Plan:   No diagnosis found. No Follow-up on file.

## 2014-06-21 DIAGNOSIS — R42 Dizziness and giddiness: Secondary | ICD-10-CM | POA: Diagnosis not present

## 2014-06-23 ENCOUNTER — Telehealth: Payer: Self-pay | Admitting: *Deleted

## 2014-06-23 DIAGNOSIS — R42 Dizziness and giddiness: Secondary | ICD-10-CM | POA: Diagnosis not present

## 2014-06-23 NOTE — Telephone Encounter (Signed)
[facility] Triage Call Report Triage Record Num: [id number] Operator: Marianne Sofia Patient Name: Jessica [name] Call Date & Time: 06/17/2014 11:30:00PM Patient Phone: [phone] PCP: Unice Cobble Patient Gender: Female PCP Fax : [phone] Patient DOB: 05/01/1949 Practice Name: [facility] Reason for Call: Caller: Cynthia/Patient; PCP: Unice Cobble; CB#: [phone]; Call regarding insect bite at 19:00 this PM 06/17/14, right buttock, still stinging and itches, now feels itchy all over body, left arm painful since bite, took Benadryl at 21:00 and pain started in left arm at 22:00, asking what to take for severe arm pain, 911 disposition obtained pr Bite Guideline due to left arm/chest pain, she was given disposition,encouraged to go to ED Protocol(s) Used: Bites and Stings - Insects or Spiders Recommended Outcome per Protocol: Activate EMS 911 Reason for Outcome: Signs/symptoms of anaphylaxis Care Advice: ~ 08/

## 2014-06-26 ENCOUNTER — Telehealth: Payer: Self-pay | Admitting: Pulmonary Disease

## 2014-06-26 NOTE — Telephone Encounter (Signed)
Your pt Jessica [name] stated dr Shawna Orleans will accept this pt and her niece. Can I sch?

## 2014-06-27 DIAGNOSIS — R42 Dizziness and giddiness: Secondary | ICD-10-CM | POA: Diagnosis not present

## 2014-06-27 NOTE — Telephone Encounter (Signed)
Yes, I will accept as new pt

## 2014-06-28 NOTE — Telephone Encounter (Signed)
Pt has been sch

## 2014-06-29 DIAGNOSIS — R42 Dizziness and giddiness: Secondary | ICD-10-CM | POA: Diagnosis not present

## 2014-07-05 DIAGNOSIS — R42 Dizziness and giddiness: Secondary | ICD-10-CM | POA: Diagnosis not present

## 2014-07-07 DIAGNOSIS — R42 Dizziness and giddiness: Secondary | ICD-10-CM | POA: Diagnosis not present

## 2014-07-12 DIAGNOSIS — R42 Dizziness and giddiness: Secondary | ICD-10-CM | POA: Diagnosis not present

## 2014-07-12 DIAGNOSIS — H8110 Benign paroxysmal vertigo, unspecified ear: Secondary | ICD-10-CM | POA: Diagnosis not present

## 2014-07-14 DIAGNOSIS — H8110 Benign paroxysmal vertigo, unspecified ear: Secondary | ICD-10-CM | POA: Diagnosis not present

## 2014-07-14 DIAGNOSIS — R42 Dizziness and giddiness: Secondary | ICD-10-CM | POA: Diagnosis not present

## 2014-07-14 DIAGNOSIS — R269 Unspecified abnormalities of gait and mobility: Secondary | ICD-10-CM | POA: Diagnosis not present

## 2014-07-14 DIAGNOSIS — R279 Unspecified lack of coordination: Secondary | ICD-10-CM | POA: Diagnosis not present

## 2014-07-19 DIAGNOSIS — R42 Dizziness and giddiness: Secondary | ICD-10-CM | POA: Diagnosis not present

## 2014-07-19 DIAGNOSIS — H8110 Benign paroxysmal vertigo, unspecified ear: Secondary | ICD-10-CM | POA: Diagnosis not present

## 2014-07-19 DIAGNOSIS — R279 Unspecified lack of coordination: Secondary | ICD-10-CM | POA: Diagnosis not present

## 2014-07-19 DIAGNOSIS — R269 Unspecified abnormalities of gait and mobility: Secondary | ICD-10-CM | POA: Diagnosis not present

## 2014-07-25 ENCOUNTER — Ambulatory Visit: Payer: Medicare Other | Admitting: Internal Medicine

## 2014-07-25 DIAGNOSIS — R269 Unspecified abnormalities of gait and mobility: Secondary | ICD-10-CM | POA: Diagnosis not present

## 2014-07-25 DIAGNOSIS — H8110 Benign paroxysmal vertigo, unspecified ear: Secondary | ICD-10-CM | POA: Diagnosis not present

## 2014-07-25 DIAGNOSIS — R279 Unspecified lack of coordination: Secondary | ICD-10-CM | POA: Diagnosis not present

## 2014-07-25 DIAGNOSIS — R42 Dizziness and giddiness: Secondary | ICD-10-CM | POA: Diagnosis not present

## 2014-07-27 ENCOUNTER — Other Ambulatory Visit: Payer: Self-pay | Admitting: Pulmonary Disease

## 2014-07-28 ENCOUNTER — Ambulatory Visit: Payer: Medicare Other | Admitting: Internal Medicine

## 2014-07-31 ENCOUNTER — Telehealth: Payer: Self-pay | Admitting: Pulmonary Disease

## 2014-07-31 NOTE — Telephone Encounter (Signed)
Called # listed x 3. Line would ring once then stop ringing. WCB

## 2014-08-01 MED ORDER — PREDNISONE (PAK) 5 MG PO TABS: 5.0000 mg | ORAL_TABLET | ORAL | Status: DC

## 2014-08-01 MED ORDER — LEVOFLOXACIN 500 MG PO TABS: 500.0000 mg | ORAL_TABLET | Freq: Every day | ORAL | Status: DC

## 2014-08-01 NOTE — Telephone Encounter (Signed)
Per SN-  Pred dose pack 5mg  6 day pack, levaquin 500mg  #7 1 PO QD, Align OTC TID, Mucinex 600mg  2 tab BID.   Advised daughter of the above and sent rx to preferred pharm. Daughter verbalized understanding and denied any further questions or concerns at this time.

## 2014-08-01 NOTE — Telephone Encounter (Signed)
Called and spoke to pt's daughter. Pt's daughter stated the pt as been c/o weakness, increase in SOB and prod cough with yellow mucus that presented 7 days ago. Pt's daughter stated pt has been alert and oriented. Pt denies CP, f/c/s, swelling and lethargy. Pt's daughter had a zpak that was unused and gave it to pt, since taking the zpak she stated the pt has slightly improved. Pt also taking mucinex. Pt's daughter is requesting recs from SN.  SN, please advise.  No Known Allergies   Current Outpatient Prescriptions on File Prior to Visit  Medication Sig Dispense Refill  . albuterol (PROVENTIL HFA;VENTOLIN HFA) 108 (90 BASE) MCG/ACT inhaler 1-2 puffs every 4 hours as needed  1 Inhaler  3  . albuterol (PROVENTIL) (2.5 MG/3ML) 0.083% nebulizer solution Take 3 mLs (2.5 mg total) by nebulization 3 (three) times daily.  270 mL  3  . ALPRAZolam (XANAX) 0.5 MG tablet TAKE ONE HALF TO ONE TABLET BY MOUTH THREE TIMES DAILY AS NEEDED for nerves. Do not exceed 3 per day.  90 tablet  3  . aspirin 81 MG tablet Take 81 mg by mouth daily.        Marland Kitchen azithromycin (ZITHROMAX) 250 MG tablet Take as directed  6 tablet  0  . Calcium Carbonate-Vitamin D (CALTRATE 600+D) 600-400 MG-UNIT per tablet Take 1 tablet by mouth daily.        . Cholecalciferol (VITAMIN D) 1000 UNITS capsule Take 1,000 Units by mouth daily.        Marland Kitchen escitalopram (LEXAPRO) 20 MG tablet Take 1 tablet (20 mg total) by mouth daily.  30 tablet  4  . HYDROcodone-acetaminophen (VICODIN) 5-500 MG per tablet Take 1 tablet by mouth every 6 (six) hours as needed for pain.       . predniSONE (DELTASONE) 20 MG tablet 1 by mouth two times daily x 3 days, 1 by mouth daily x 3 days, 1/2 tablet daily x 3 days, 1/2 tablet every other day until gone.  15 tablet  0  . simvastatin (ZOCOR) 20 MG tablet Take 1 tablet (20 mg total) by mouth at bedtime.  90 tablet  1  . vitamin C (ASCORBIC ACID) 500 MG tablet Take 500 mg by mouth daily.       No current  facility-administered medications on file prior to visit.

## 2014-09-05 ENCOUNTER — Ambulatory Visit: Payer: Medicare Other | Admitting: Internal Medicine

## 2014-09-06 ENCOUNTER — Ambulatory Visit (INDEPENDENT_AMBULATORY_CARE_PROVIDER_SITE_OTHER): Payer: Medicare Other | Admitting: Internal Medicine

## 2014-09-06 ENCOUNTER — Encounter: Payer: Self-pay | Admitting: Internal Medicine

## 2014-09-06 VITALS — BP 124/68 | HR 76 | Temp 98.3°F | Ht 65.0 in | Wt 139.0 lb

## 2014-09-06 DIAGNOSIS — R05 Cough: Secondary | ICD-10-CM

## 2014-09-06 DIAGNOSIS — R053 Chronic cough: Secondary | ICD-10-CM

## 2014-09-06 DIAGNOSIS — I2510 Atherosclerotic heart disease of native coronary artery without angina pectoris: Secondary | ICD-10-CM | POA: Diagnosis not present

## 2014-09-06 DIAGNOSIS — R413 Other amnesia: Secondary | ICD-10-CM | POA: Diagnosis not present

## 2014-09-06 DIAGNOSIS — Z23 Encounter for immunization: Secondary | ICD-10-CM

## 2014-09-06 DIAGNOSIS — R0989 Other specified symptoms and signs involving the circulatory and respiratory systems: Secondary | ICD-10-CM | POA: Diagnosis not present

## 2014-09-06 DIAGNOSIS — M810 Age-related osteoporosis without current pathological fracture: Secondary | ICD-10-CM | POA: Diagnosis not present

## 2014-09-06 NOTE — Progress Notes (Signed)
Subjective:    Patient ID: Jessica [name], female    DOB: September 21, 1926, 80 y.o.   MRN: [id number]  HPI  80 year old white female with history of tobacco use, anxiety disorder and degenerative joint disease to transfer care. Patient previously followed by Dr. Lenna Gilford. Patient has a 60 year pack history. She quit smoking in 1993. She is accompanied by her supportive daughter.  Patient never diagnosed officially with COPD. She had episode of significant bronchitis in August 2015 which required 2 rounds of antibiotics. Patient complains of chronic intermittent cough. Her medical records reviewed. She underwent CT of chest in 2011 with IV contrast. It is notable for 5 mm nodule in the right lower lobe. She has not had follow-up CT scan but her chest x-ray in May 2015 was unremarkable.  Chronic anxiety-patient is stable on her current dose of Lexapro 20 mg once daily.  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  Eyes: Negative for visual disturbance.  Respiratory: Negative for cough, chest tightness and shortness of breath.   Cardiovascular: Negative for chest pain.  Genitourinary: Negative for difficulty urinating.  Neurological: Negative for headaches.  Patient denies significant memory loss.  Her daughter helps her with her finances  Gastrointestinal: Negative for abdominal pain, heartburn melena or hematochezia Psych: Negative for depression or anxiety Endo:  No polyuria or polydypsia        Past Medical History  Diagnosis Date  . congenital nystagmus   . Acute asthmatic bronchitis   . Venous insufficiency   . Atypical chest pain   . Other and unspecified hyperlipidemia   . DJD (degenerative joint disease)   . Low back pain syndrome   . Fibromyalgia   . Memory loss   . Anxiety     History   Social History  . Marital Status: Widowed     Spouse Name: N/A    Number of Children: 3  . Years of Education: N/A   Occupational History  .     Social History Main Topics  . Smoking status: Former Smoker -- 2.00 packs/day for 30 years    Types: Cigarettes    Quit date: 10/28/1991  . Smokeless tobacco: Not on file  . Alcohol Use: No  . Drug Use: No  . Sexual Activity: Not on file   Other Topics Concern  . Not on file   Social History Narrative   1 sibling alive age 48   1 sibling alive age 60   1 sibling deceased age 43   1 sibling alive age 44    Past Surgical History  Procedure Laterality Date  . Cataract extraction    . Abdominal hysterectomy    . Anterior cervical discectomy    . Lumbar laminectomy    . Right knee arthroscopy    . Right shoulder surgery  2008    Dr. Noemi Chapel  . Right shoulder replacement  04/2009  . Carpal tunnel release  12/2011    right arm    No family history on file.  No Known Allergies  Current Outpatient Prescriptions on File Prior to Visit  Medication Sig Dispense Refill  . albuterol (PROVENTIL HFA;VENTOLIN HFA) 108 (90 BASE) MCG/ACT inhaler 1-2 puffs every 4 hours as needed 1 Inhaler 3  . albuterol (PROVENTIL) (2.5 MG/3ML) 0.083% nebulizer solution Take 3 mLs (2.5 mg total) by nebulization 3 (three) times daily. 270 mL 3  .  ALPRAZolam (XANAX) 0.5 MG tablet TAKE ONE HALF TO ONE TABLET BY MOUTH THREE TIMES DAILY AS NEEDED for nerves. Do not exceed 3 per day. 90 tablet 3  . aspirin 81 MG tablet Take 81 mg by mouth daily.      . Calcium Carbonate-Vitamin D (CALTRATE 600+D) 600-400 MG-UNIT per tablet Take 1 tablet by mouth daily.      . Cholecalciferol (VITAMIN D) 1000 UNITS capsule Take 1,000 Units by mouth daily.      Marland Kitchen escitalopram (LEXAPRO) 20 MG tablet Take 1 tablet (20 mg total) by mouth daily. 30 tablet 4  . HYDROcodone-acetaminophen (VICODIN) 5-500 MG per tablet Take 1 tablet by mouth every 6 (six) hours as needed for pain.     . simvastatin (ZOCOR) 20 MG tablet Take 1 tablet  (20 mg total) by mouth at bedtime. 90 tablet 1  . vitamin C (ASCORBIC ACID) 500 MG tablet Take 500 mg by mouth daily.     No current facility-administered medications on file prior to visit.    BP 124/68 mmHg  Pulse 76  Temp(Src) 98.3 F (36.8 C) (Oral)  Ht 5\' 5"  (1.651 m)  Wt 139 lb (63.05 kg)  BMI 23.13 kg/m2     Objective:   Physical Exam  Constitutional: She is oriented to person, place, and time. She appears well-developed and well-nourished. No distress.  HENT:  Head: Normocephalic and atraumatic.  Mouth/Throat: Oropharynx is clear and moist.  Eyes: Conjunctivae and EOM are normal. Pupils are equal, round, and reactive to light.  Neck: Neck supple.  Left carotid bruit  Cardiovascular: Normal rate, regular rhythm and normal heart sounds.   Systolic ejection murmur right sternal border 2 out of 6  Pulmonary/Chest: Effort normal. She has no wheezes.  Prolonged expiration, slight coarse breath sounds bilaterally  Abdominal: Soft. Bowel sounds are normal. There is no tenderness.  Musculoskeletal:  Bilateral hand osteoarthritis (Heberden's and Bouchard's nodes)  Neurological: She is alert and oriented to person, place, and time. No cranial nerve deficit.  Skin: Skin is warm and dry.  Psychiatric: She has a normal mood and affect. Her behavior is normal.          Assessment & Plan:

## 2014-09-06 NOTE — Assessment & Plan Note (Signed)
Patient has history of memory loss. Patient oriented to person, place, month and year. Patient likely has mild cognitive impairment. Consider further testing with Mini-Mental Status exam

## 2014-09-06 NOTE — Assessment & Plan Note (Signed)
Patient has left carotid bruit. Obtain carotid Dopplers. Continue statin and aspirin therapy.

## 2014-09-06 NOTE — Assessment & Plan Note (Signed)
80 year old white female with 60-pack-year history complains of chronic cough. She quit smoking in 1993. She likely has underlying COPD. Repeat chest x-ray. Obtain PFTs. Patient may benefit from inhaled corticosteroids. Patient updated with Prevnar 13.

## 2014-09-06 NOTE — Progress Notes (Signed)
Pre visit review using our clinic review tool, if applicable. No additional management support is needed unless otherwise documented below in the visit note. 

## 2014-09-12 ENCOUNTER — Ambulatory Visit (HOSPITAL_COMMUNITY): Payer: Medicare Other | Attending: Cardiology | Admitting: *Deleted

## 2014-09-12 DIAGNOSIS — R0989 Other specified symptoms and signs involving the circulatory and respiratory systems: Secondary | ICD-10-CM

## 2014-09-12 DIAGNOSIS — Z87891 Personal history of nicotine dependence: Secondary | ICD-10-CM | POA: Insufficient documentation

## 2014-09-12 NOTE — Progress Notes (Signed)
Carotid Duplex Performed 

## 2014-11-07 DIAGNOSIS — H6123 Impacted cerumen, bilateral: Secondary | ICD-10-CM | POA: Diagnosis not present

## 2014-11-08 ENCOUNTER — Ambulatory Visit: Payer: Medicare Other | Admitting: Internal Medicine

## 2014-11-09 ENCOUNTER — Ambulatory Visit (INDEPENDENT_AMBULATORY_CARE_PROVIDER_SITE_OTHER)
Admission: RE | Admit: 2014-11-09 | Discharge: 2014-11-09 | Disposition: A | Discharge status: Home / Self Care | Payer: Medicare Other | Source: Ambulatory Visit | Attending: Internal Medicine | Admitting: Internal Medicine

## 2014-11-09 ENCOUNTER — Encounter: Payer: Self-pay | Admitting: Internal Medicine

## 2014-11-09 ENCOUNTER — Ambulatory Visit (INDEPENDENT_AMBULATORY_CARE_PROVIDER_SITE_OTHER): Payer: Medicare Other | Admitting: Internal Medicine

## 2014-11-09 VITALS — BP 120/68 | HR 85 | Temp 97.7°F | Resp 20 | Ht 65.0 in | Wt 143.0 lb

## 2014-11-09 DIAGNOSIS — J441 Chronic obstructive pulmonary disease with (acute) exacerbation: Secondary | ICD-10-CM

## 2014-11-09 DIAGNOSIS — J209 Acute bronchitis, unspecified: Secondary | ICD-10-CM | POA: Diagnosis not present

## 2014-11-09 DIAGNOSIS — J449 Chronic obstructive pulmonary disease, unspecified: Secondary | ICD-10-CM | POA: Diagnosis not present

## 2014-11-09 DIAGNOSIS — R918 Other nonspecific abnormal finding of lung field: Secondary | ICD-10-CM | POA: Diagnosis not present

## 2014-11-09 DIAGNOSIS — R0989 Other specified symptoms and signs involving the circulatory and respiratory systems: Secondary | ICD-10-CM | POA: Diagnosis not present

## 2014-11-09 DIAGNOSIS — R0602 Shortness of breath: Secondary | ICD-10-CM | POA: Diagnosis not present

## 2014-11-09 MED ORDER — AZITHROMYCIN 250 MG PO TABS: ORAL_TABLET | ORAL | Status: DC

## 2014-11-09 NOTE — Progress Notes (Signed)
Subjective:    Patient ID: Jessica [name], female    DOB: [birth year], 81 y.o.   MRN: [id number]  HPI 81 year old patient who has a prior history of tobacco use.  4 days ago.  She had an episode of chills and over the past few days has had worsening chest congestion and cough.  No documented fever.  No wheezing.  However, she has been using home nebulizer treatments with albuterol She has a history of a left carotid bruit and this has been evaluated with carotid artery Doppler study that revealed noncritical disease.  Denies any focal neurological complaints  Past Medical History  Diagnosis Date  . congenital nystagmus   . Acute asthmatic bronchitis   . Venous insufficiency   . Atypical chest pain   . Other and unspecified hyperlipidemia   . DJD (degenerative joint disease)   . Low back pain syndrome   . Fibromyalgia   . Memory loss   . Anxiety     History   Social History  . Marital Status: Widowed    Spouse Name: N/A    Number of Children: 3  . Years of Education: N/A   Occupational History  .     Social History Main Topics  . Smoking status: Former Smoker -- 2.00 packs/day for 30 years    Types: Cigarettes    Quit date: 10/28/1991  . Smokeless tobacco: Not on file  . Alcohol Use: No  . Drug Use: No  . Sexual Activity: Not on file   Other Topics Concern  . Not on file   Social History Narrative   1 sibling alive age 53   1 sibling alive age 75   1 sibling deceased age 71   1 sibling alive age 72    Past Surgical History  Procedure Laterality Date  . Cataract extraction    . Abdominal hysterectomy    . Anterior cervical discectomy    . Lumbar laminectomy    . Right knee arthroscopy    . Right shoulder surgery  2008    Dr. Noemi Chapel  . Right shoulder replacement  04/2009  . Carpal tunnel release  12/2011    right arm    No family history on file.  No Known Allergies  Current Outpatient Prescriptions on File Prior to Visit  Medication Sig Dispense  Refill  . albuterol (PROVENTIL HFA;VENTOLIN HFA) 108 (90 BASE) MCG/ACT inhaler 1-2 puffs every 4 hours as needed 1 Inhaler 3  . albuterol (PROVENTIL) (2.5 MG/3ML) 0.083% nebulizer solution Take 3 mLs (2.5 mg total) by nebulization 3 (three) times daily. 270 mL 3  . ALPRAZolam (XANAX) 0.5 MG tablet TAKE ONE HALF TO ONE TABLET BY MOUTH THREE TIMES DAILY AS NEEDED for nerves. Do not exceed 3 per day. 90 tablet 3  . aspirin 81 MG tablet Take 81 mg by mouth daily.      . Calcium Carbonate-Vitamin D (CALTRATE 600+D) 600-400 MG-UNIT per tablet Take 1 tablet by mouth daily.      . Cholecalciferol (VITAMIN D) 1000 UNITS capsule Take 1,000 Units by mouth daily.      Marland Kitchen escitalopram (LEXAPRO) 20 MG tablet Take 1 tablet (20 mg total) by mouth daily. 30 tablet 4  . HYDROcodone-acetaminophen (VICODIN) 5-500 MG per tablet Take 1 tablet by mouth every 6 (six) hours as needed for pain.     . simvastatin (ZOCOR) 20 MG tablet Take 1 tablet (20 mg total) by mouth at bedtime. 90 tablet 1  .  vitamin C (ASCORBIC ACID) 500 MG tablet Take 500 mg by mouth daily.     No current facility-administered medications on file prior to visit.    BP 120/68 mmHg  Pulse 85  Temp(Src) 97.7 F (36.5 C) (Oral)  Resp 20  Ht 5\' 5"  (1.651 m)  Wt 143 lb (64.864 kg)  BMI 23.80 kg/m2  SpO2 95%      Review of Systems  Constitutional: Positive for chills, activity change and appetite change.  HENT: Positive for congestion. Negative for dental problem, hearing loss, rhinorrhea, sinus pressure, sore throat and tinnitus.   Eyes: Negative for pain, discharge and visual disturbance.  Respiratory: Positive for cough. Negative for shortness of breath.   Cardiovascular: Negative for chest pain, palpitations and leg swelling.  Gastrointestinal: Negative for nausea, vomiting, abdominal pain, diarrhea, constipation, blood in stool and abdominal distention.  Genitourinary: Negative for dysuria, urgency, frequency, hematuria, flank pain,  vaginal bleeding, vaginal discharge, difficulty urinating, vaginal pain and pelvic pain.  Musculoskeletal: Negative for joint swelling, arthralgias and gait problem.  Skin: Negative for rash.  Neurological: Negative for dizziness, syncope, speech difficulty, weakness, numbness and headaches.  Hematological: Negative for adenopathy.  Psychiatric/Behavioral: Negative for behavioral problems, dysphoric mood and agitation. The patient is not nervous/anxious.        Objective:   Physical Exam  Constitutional: She is oriented to person, place, and time. She appears well-developed and well-nourished.  HENT:  Head: Normocephalic.  Right Ear: External ear normal.  Left Ear: External ear normal.  Mouth/Throat: Oropharynx is clear and moist.  Eyes: Conjunctivae and EOM are normal. Pupils are equal, round, and reactive to light.  Neck: Normal range of motion. Neck supple. No thyromegaly present.  LOUD left bruit  Cardiovascular: Normal rate, regular rhythm and intact distal pulses.   Murmur heard. Pulmonary/Chest: Effort normal. She has rales.  Scattered coarse rhonchi and bibasilar rales, left greater than the right  Abdominal: Soft. Bowel sounds are normal. She exhibits no mass. There is no tenderness.  Musculoskeletal: Normal range of motion.  Lymphadenopathy:    She has no cervical adenopathy.  Neurological: She is alert and oriented to person, place, and time.  Skin: Skin is warm and dry. No rash noted.  Psychiatric: She has a normal mood and affect. Her behavior is normal.          Assessment & Plan:   Exacerbation of COPD, rule out community-acquired pneumonia.  Will treat with azithromycin and expectorants.  A chest x-ray will be reviewed.  Will continue home nebulizer treatments.  We'll report any clinical worsening Left carotid bruit.  Stable

## 2014-11-09 NOTE — Patient Instructions (Signed)
Take over-the-counter expectorants and cough medications such as  Mucinex DM.  Call if there is no improvement in 5 to 7 days or if  you develop worsening cough, fever, or new symptoms, such as shortness of breath or chest pain.  Chronic Obstructive Pulmonary Disease Exacerbation Chronic obstructive pulmonary disease (COPD) is a common lung condition in which airflow from the lungs is limited. COPD is a general term that can be used to describe many different lung problems that limit airflow, including chronic bronchitis and emphysema. COPD exacerbations are episodes when breathing symptoms become much worse and require extra treatment. Without treatment, COPD exacerbations can be life threatening, and frequent COPD exacerbations can cause further damage to your lungs. CAUSES   Respiratory infections.   Exposure to smoke.   Exposure to air pollution, chemical fumes, or dust. Sometimes there is no apparent cause or trigger. RISK FACTORS  Smoking cigarettes.  Older age.  Frequent prior COPD exacerbations. SIGNS AND SYMPTOMS   Increased coughing.   Increased thick spit (sputum) production.   Increased wheezing.   Increased shortness of breath.   Rapid breathing.   Chest tightness. DIAGNOSIS  Your medical history, a physical exam, and tests will help your health care provider make a diagnosis. Tests may include:  A chest X-ray.  Basic lab tests.  Sputum testing.  An arterial blood gas test. TREATMENT  Depending on the severity of your COPD exacerbation, you may need to be admitted to a hospital for treatment. Some of the treatments commonly used to treat COPD exacerbations are:   Antibiotic medicines.   Bronchodilators. These are drugs that expand the air passages. They may be given with an inhaler or nebulizer. Spacer devices may be needed to help improve drug delivery.  Corticosteroid medicines.  Supplemental oxygen therapy.  HOME CARE INSTRUCTIONS   Do  not smoke. Quitting smoking is very important to prevent COPD from getting worse and exacerbations from happening as often.  Avoid exposure to all substances that irritate the airway, especially to tobacco smoke.   If you were prescribed an antibiotic medicine, finish it all even if you start to feel better.  Take all medicines as directed by your health care provider.It is important to use correct technique with inhaled medicines.  Drink enough fluids to keep your urine clear or pale yellow (unless you have a medical condition that requires fluid restriction).  Use a cool mist vaporizer. This makes it easier to clear your chest when you cough.   If you have a home nebulizer and oxygen, continue to use them as directed.   Maintain all necessary vaccinations to prevent infections.   Exercise regularly.   Eat a healthy diet.   Keep all follow-up appointments as directed by your health care provider. SEEK IMMEDIATE MEDICAL CARE IF:  You have worsening shortness of breath.   You have trouble talking.   You have severe chest pain.  You have blood in your sputum.  You have a fever.  You have weakness, vomit repeatedly, or faint.   You feel confused.   You continue to get worse. MAKE SURE YOU:   Understand these instructions.  Will watch your condition.  Will get help right away if you are not doing well or get worse. Document Released: 08/10/2007 Document Revised: 02/27/2014 Document Reviewed: 06/17/2013 [facility] Patient Information 2015 [place], [place]. This information is not intended to replace advice given to you by your health care provider. Make sure you discuss any questions you have with your  health care provider.  

## 2014-11-09 NOTE — Progress Notes (Signed)
Pre visit review using our clinic review tool, if applicable. No additional management support is needed unless otherwise documented below in the visit note. 

## 2014-11-17 ENCOUNTER — Ambulatory Visit: Payer: Medicare Other | Admitting: Family Medicine

## 2014-12-14 ENCOUNTER — Telehealth: Payer: Self-pay | Admitting: Internal Medicine

## 2014-12-14 MED ORDER — ALPRAZOLAM 0.5 MG PO TABS
ORAL_TABLET | ORAL | Status: DC
Start: 2014-12-14 — End: 2015-04-12

## 2014-12-14 NOTE — Telephone Encounter (Signed)
rx faxed

## 2014-12-14 NOTE — Telephone Encounter (Signed)
[name] call stated she spoke with someone about her mother Alprazolam 0.5  they said they would call it in but did not ,She said her mother is out.

## 2014-12-20 ENCOUNTER — Ambulatory Visit (INDEPENDENT_AMBULATORY_CARE_PROVIDER_SITE_OTHER): Payer: Medicare Other | Admitting: Internal Medicine

## 2014-12-20 ENCOUNTER — Encounter: Payer: Self-pay | Admitting: Internal Medicine

## 2014-12-20 VITALS — BP 140/74 | HR 83 | Temp 97.9°F | Ht 65.0 in | Wt 142.0 lb

## 2014-12-20 DIAGNOSIS — J441 Chronic obstructive pulmonary disease with (acute) exacerbation: Secondary | ICD-10-CM | POA: Diagnosis not present

## 2014-12-20 DIAGNOSIS — R05 Cough: Secondary | ICD-10-CM | POA: Diagnosis not present

## 2014-12-20 DIAGNOSIS — R053 Chronic cough: Secondary | ICD-10-CM

## 2014-12-20 MED ORDER — PREDNISONE 20 MG PO TABS: ORAL_TABLET | ORAL | Status: DC

## 2014-12-20 MED ORDER — DOXYCYCLINE HYCLATE 100 MG PO TABS: 100.0000 mg | ORAL_TABLET | Freq: Two times a day (BID) | ORAL | Status: DC

## 2014-12-20 MED ORDER — FLUTICASONE-SALMETEROL 250-50 MCG/DOSE IN AEPB: 1.0000 | INHALATION_SPRAY | Freq: Two times a day (BID) | RESPIRATORY_TRACT | Status: DC

## 2014-12-20 NOTE — Progress Notes (Signed)
Pre visit review using our clinic review tool, if applicable. No additional management support is needed unless otherwise documented below in the visit note. 

## 2014-12-20 NOTE — Progress Notes (Deleted)
Subjective:     Patient ID: Jessica [name], female   DOB: 1926/10/02, 81 y.o.   MRN: [id number]  HPI   Review of Systems     Objective:   Physical Exam     Assessment:     ***    Plan:     ***

## 2014-12-20 NOTE — Assessment & Plan Note (Signed)
Patient experiencing COPD exacerbation. Patient seen in January 2016 and treated with azithromycin and prednisone taper. Chest x-ray concerning for focal opacity at the posterior base on lateral view.  She has persistent cough and wheezing. Treat with doxycycline 20 mg twice daily and repeat prednisone taper. Also start Advair 250/50 one dose twice daily.  Obtain CT of chest without contrast considering 60-pack-year smoking history.  Patient's daughter declines referral for home nebulizer. She already has nebulizer and albuterol to use at home.

## 2014-12-20 NOTE — Progress Notes (Signed)
Subjective:    Patient ID: Jessica [name], female    DOB: 24-Mar-1926, 81 y.o.   MRN: [id number]  HPI  81 year old white female with history of tobacco use (60 pack year), hyperlipidemia carotid artery disease for follow-up. Patient seen in January, 79 by Dr. Raliegh Ip for asthmatic bronchitis. She was treated with azithromycin and prednisone taper. She is accompanied by supportive daughter. Her cough has improved but not resolved. She has had chronic cough for months.  At her initial visit in October,2015  she was referred for port function tests but this was not completed.  Patient completed chest x-ray on 11/09/2014. It is notable for COPD changes. "Focal opacity at the posterior lung base on lateral view could represent atelectasis or infiltrate or mass/nodule not excluded."  Review of Systems Chronic cough, mild shortness of breath, negative for fever    Past Medical History  Diagnosis Date  . congenital nystagmus   . Acute asthmatic bronchitis   . Venous insufficiency   . Atypical chest pain   . Other and unspecified hyperlipidemia   . DJD (degenerative joint disease)   . Low back pain syndrome   . Fibromyalgia   . Memory loss   . Anxiety     History   Social History  . Marital Status: Widowed    Spouse Name: N/A  . Number of Children: 3  . Years of Education: N/A   Occupational History  .     Social History Main Topics  . Smoking status: Former Smoker -- 2.00 packs/day for 30 years    Types: Cigarettes    Quit date: 10/28/1991  . Smokeless tobacco: Not on file  . Alcohol Use: No  . Drug Use: No  . Sexual Activity: Not on file   Other Topics Concern  . Not on file   Social History Narrative   1 sibling alive age 70   1 sibling alive age 44   1 sibling deceased age 86   1 sibling alive age 44    Past Surgical History  Procedure Laterality Date  . Cataract extraction    . Abdominal hysterectomy    . Anterior cervical discectomy    . Lumbar laminectomy    .  Right knee arthroscopy    . Right shoulder surgery  2008    Dr. Noemi Chapel  . Right shoulder replacement  04/2009  . Carpal tunnel release  12/2011    right arm    No family history on file.  No Known Allergies  Current Outpatient Prescriptions on File Prior to Visit  Medication Sig Dispense Refill  . albuterol (PROVENTIL HFA;VENTOLIN HFA) 108 (90 BASE) MCG/ACT inhaler 1-2 puffs every 4 hours as needed 1 Inhaler 3  . albuterol (PROVENTIL) (2.5 MG/3ML) 0.083% nebulizer solution Take 3 mLs (2.5 mg total) by nebulization 3 (three) times daily. 270 mL 3  . ALPRAZolam (XANAX) 0.5 MG tablet TAKE ONE HALF TO ONE TABLET BY MOUTH THREE TIMES DAILY AS NEEDED for nerves. Do not exceed 3 per day. 90 tablet 3  . aspirin 81 MG tablet Take 81 mg by mouth daily.      Marland Kitchen azithromycin (ZITHROMAX) 250 MG tablet 2 tablets once daily for 3 consecutive days 6 tablet 0  . Calcium Carbonate-Vitamin D (CALTRATE 600+D) 600-400 MG-UNIT per tablet Take 1 tablet by mouth daily.      . Cholecalciferol (VITAMIN D) 1000 UNITS capsule Take 1,000 Units by mouth daily.      Marland Kitchen escitalopram (LEXAPRO) 20 MG  tablet Take 1 tablet (20 mg total) by mouth daily. 30 tablet 4  . HYDROcodone-acetaminophen (VICODIN) 5-500 MG per tablet Take 1 tablet by mouth every 6 (six) hours as needed for pain.     . simvastatin (ZOCOR) 20 MG tablet Take 1 tablet (20 mg total) by mouth at bedtime. 90 tablet 1  . vitamin C (ASCORBIC ACID) 500 MG tablet Take 500 mg by mouth daily.     No current facility-administered medications on file prior to visit.    BP 140/74 mmHg  Pulse 83  Temp(Src) 97.9 F (36.6 C) (Oral)  Ht 5\' 5"  (1.651 m)  Wt 142 lb (64.411 kg)  BMI 23.63 kg/m2    Objective:   Physical Exam  Constitutional: She is oriented to person, place, and time. She appears well-developed and well-nourished. No distress.  HENT:  Head: Normocephalic and atraumatic.  Right Ear: External ear normal.  Left Ear: External ear normal.    Mouth/Throat: Oropharynx is clear and moist.  Neck: Neck supple.  Cardiovascular: Normal rate and normal heart sounds.  Exam reveals no gallop.   No murmur heard. Pulmonary/Chest: Effort normal.  Prolonged expiration, scattered expiratory wheeze  Musculoskeletal: She exhibits no edema.  Neurological: She is alert and oriented to person, place, and time. No cranial nerve deficit.  Skin: Skin is warm and dry.  Psychiatric: She has a normal mood and affect. Her behavior is normal.          Assessment & Plan:

## 2014-12-28 ENCOUNTER — Ambulatory Visit (INDEPENDENT_AMBULATORY_CARE_PROVIDER_SITE_OTHER)
Admission: RE | Admit: 2014-12-28 | Discharge: 2014-12-28 | Disposition: A | Discharge status: Home / Self Care | Payer: Medicare Other | Source: Ambulatory Visit | Attending: Internal Medicine | Admitting: Internal Medicine

## 2014-12-28 DIAGNOSIS — R05 Cough: Secondary | ICD-10-CM

## 2014-12-28 DIAGNOSIS — R053 Chronic cough: Secondary | ICD-10-CM

## 2014-12-28 DIAGNOSIS — J984 Other disorders of lung: Secondary | ICD-10-CM | POA: Diagnosis not present

## 2015-01-24 ENCOUNTER — Ambulatory Visit (INDEPENDENT_AMBULATORY_CARE_PROVIDER_SITE_OTHER): Payer: Medicare Other | Admitting: Internal Medicine

## 2015-01-24 ENCOUNTER — Encounter: Payer: Self-pay | Admitting: Internal Medicine

## 2015-01-24 VITALS — BP 138/72 | HR 72 | Temp 98.2°F | Ht 65.0 in | Wt 138.0 lb

## 2015-01-24 DIAGNOSIS — K5900 Constipation, unspecified: Secondary | ICD-10-CM | POA: Diagnosis not present

## 2015-01-24 DIAGNOSIS — R011 Cardiac murmur, unspecified: Secondary | ICD-10-CM | POA: Diagnosis not present

## 2015-01-24 DIAGNOSIS — J441 Chronic obstructive pulmonary disease with (acute) exacerbation: Secondary | ICD-10-CM | POA: Diagnosis not present

## 2015-01-24 DIAGNOSIS — J439 Emphysema, unspecified: Secondary | ICD-10-CM | POA: Diagnosis not present

## 2015-01-24 MED ORDER — POLYETHYLENE GLYCOL 3350 17 GM/SCOOP PO POWD: 17.0000 g | Freq: Two times a day (BID) | ORAL | Status: DC | PRN

## 2015-01-24 MED ORDER — FLUTICASONE-SALMETEROL 250-50 MCG/DOSE IN AEPB: 1.0000 | INHALATION_SPRAY | Freq: Two times a day (BID) | RESPIRATORY_TRACT | Status: DC

## 2015-01-24 NOTE — Assessment & Plan Note (Signed)
CT of chest negative for signs of malignancy.  Her respiratory status is back to baseline. Patient advised to use Advair and a regular basis. Obtain PFTs as planned to assess severity of COPD.

## 2015-01-24 NOTE — Progress Notes (Signed)
Subjective:    Patient ID: Jessica [name], female    DOB: 15-Apr-1926, 81 y.o.   MRN: [id number]  HPI  81 year old white female previously seen for pneumonia/bronchitis for follow-up. She completed CT of the chest. It was negative for edema or consolidation. The opacity in the posterior lung base seen on the lateral view of most recent chest radiograph is not apparent currently. Several small stable nodular opacities remain in the lungs. There are areas of mild scarring. No new opacity. There is underlying central lobar emphysema.  Patient reports her cough resolved. She stopped using Advair but her symptoms improved.  She still reports dyspnea with exertion. She never completed PFTs.  She also has heart murmur. No 2-D echocardiogram in medical records for review.  Patient accompanied by supportive daughter. She uses Vicodin 2-3 times per week to treat chronic left knee pain secondary to osteoarthritis. She complains of associated constipation.  Review of Systems Dyspnea, no chronic cough.  Weight is stable.       Past Medical History  Diagnosis Date  . congenital nystagmus   . Acute asthmatic bronchitis   . Venous insufficiency   . Atypical chest pain   . Other and unspecified hyperlipidemia   . DJD (degenerative joint disease)   . Low back pain syndrome   . Fibromyalgia   . Memory loss   . Anxiety     History   Social History  . Marital Status: Widowed    Spouse Name: N/A  . Number of Children: 3  . Years of Education: N/A   Occupational History  .     Social History Main Topics  . Smoking status: Former Smoker -- 2.00 packs/day for 30 years    Types: Cigarettes    Quit date: 10/28/1991  . Smokeless tobacco: Not on file  . Alcohol Use: No  . Drug Use: No  . Sexual Activity: Not on file   Other Topics Concern  . Not on file   Social History Narrative   1 sibling alive age 49   1 sibling alive age 72   1 sibling deceased age 33   1 sibling alive age 77     Past Surgical History  Procedure Laterality Date  . Cataract extraction    . Abdominal hysterectomy    . Anterior cervical discectomy    . Lumbar laminectomy    . Right knee arthroscopy    . Right shoulder surgery  2008    Dr. Noemi Chapel  . Right shoulder replacement  04/2009  . Carpal tunnel release  12/2011    right arm    No family history on file.  No Known Allergies  Current Outpatient Prescriptions on File Prior to Visit  Medication Sig Dispense Refill  . albuterol (PROVENTIL HFA;VENTOLIN HFA) 108 (90 BASE) MCG/ACT inhaler 1-2 puffs every 4 hours as needed 1 Inhaler 3  . albuterol (PROVENTIL) (2.5 MG/3ML) 0.083% nebulizer solution Take 3 mLs (2.5 mg total) by nebulization 3 (three) times daily. 270 mL 3  . ALPRAZolam (XANAX) 0.5 MG tablet TAKE ONE HALF TO ONE TABLET BY MOUTH THREE TIMES DAILY AS NEEDED for nerves. Do not exceed 3 per day. 90 tablet 3  . aspirin 81 MG tablet Take 81 mg by mouth daily.      . Calcium Carbonate-Vitamin D (CALTRATE 600+D) 600-400 MG-UNIT per tablet Take 1 tablet by mouth daily.      . Cholecalciferol (VITAMIN D) 1000 UNITS capsule Take 1,000 Units by mouth daily.      Marland Kitchen  escitalopram (LEXAPRO) 20 MG tablet Take 1 tablet (20 mg total) by mouth daily. 30 tablet 4  . HYDROcodone-acetaminophen (VICODIN) 5-500 MG per tablet Take 1 tablet by mouth every 6 (six) hours as needed for pain.     . predniSONE (DELTASONE) 20 MG tablet Take 1 tab twice daily for 4 days, then 1 tab once daily for 4 days, then 1/2 tab daily for 4 days 14 tablet 0  . simvastatin (ZOCOR) 20 MG tablet Take 1 tablet (20 mg total) by mouth at bedtime. 90 tablet 1  . vitamin C (ASCORBIC ACID) 500 MG tablet Take 500 mg by mouth daily.     No current facility-administered medications on file prior to visit.    BP 138/72 mmHg  Pulse 72  Temp(Src) 98.2 F (36.8 C) (Oral)  Ht 5\' 5"  (1.651 m)  Wt 138 lb (62.596 kg)  BMI 22.96 kg/m2    Objective:   Physical Exam  Constitutional:  She is oriented to person, place, and time. She appears well-developed and well-nourished. No distress.  HENT:  Head: Normocephalic and atraumatic.  Mouth/Throat: Oropharynx is clear and moist.  Cardiovascular: Normal rate, regular rhythm and normal heart sounds.   No murmur heard. Pulmonary/Chest: Effort normal. She has no wheezes.  Prolonged expiration, no wheezing  Musculoskeletal: She exhibits no edema.  Neurological: She is alert and oriented to person, place, and time.  Psychiatric: She has a normal mood and affect. Her behavior is normal.          Assessment & Plan:

## 2015-01-24 NOTE — Patient Instructions (Signed)
Use over-the-counter Metamucil once daily

## 2015-01-24 NOTE — Assessment & Plan Note (Signed)
Patient has constipation associated with Vicodin use.  Use miralax and metamucile as directed.  She may need stronger laxative.

## 2015-01-24 NOTE — Progress Notes (Signed)
Pre visit review using our clinic review tool, if applicable. No additional management support is needed unless otherwise documented below in the visit note. 

## 2015-01-24 NOTE — Assessment & Plan Note (Signed)
Patient complains of chronic dyspnea. This is likely secondary to COPD. She has systolic ejection murmur right sternal border which is likely secondary to aortic sclerosis. Obtain 2-D echocardiogram.

## 2015-01-26 ENCOUNTER — Ambulatory Visit (INDEPENDENT_AMBULATORY_CARE_PROVIDER_SITE_OTHER): Payer: Medicare Other | Admitting: Internal Medicine

## 2015-01-26 DIAGNOSIS — J439 Emphysema, unspecified: Secondary | ICD-10-CM | POA: Diagnosis not present

## 2015-01-26 LAB — PULMONARY FUNCTION TEST
DL/VA % PRED: 75 %
DL/VA: 3.56 ml/min/mmHg/L
DLCO unc % pred: 64 %
DLCO unc: 14.87 ml/min/mmHg
FEF 25-75 Post: 0.87 L/sec
FEF 25-75 Pre: 0.53 L/sec
FEF2575-%CHANGE-POST: 64 %
FEF2575-%PRED-PRE: 53 %
FEF2575-%Pred-Post: 88 %
FEV1-%CHANGE-POST: 25 %
FEV1-%Pred-Post: 82 %
FEV1-%Pred-Pre: 65 %
FEV1-Post: 1.31 L
FEV1-Pre: 1.05 L
FEV1FVC-%Change-Post: 11 %
FEV1FVC-%Pred-Pre: 75 %
FEV6-%Change-Post: 13 %
FEV6-%PRED-POST: 107 %
FEV6-%Pred-Pre: 94 %
FEV6-POST: 2.18 L
FEV6-Pre: 1.92 L
FEV6FVC-%Change-Post: 0 %
FEV6FVC-%PRED-PRE: 105 %
FEV6FVC-%Pred-Post: 106 %
FVC-%Change-Post: 12 %
FVC-%PRED-PRE: 89 %
FVC-%Pred-Post: 100 %
FVC-POST: 2.18 L
FVC-PRE: 1.94 L
Post FEV1/FVC ratio: 60 %
Post FEV6/FVC ratio: 100 %
Pre FEV1/FVC ratio: 54 %
Pre FEV6/FVC Ratio: 99 %
RV % pred: 119 %
RV: 2.97 L
TLC % PRED: 101 %
TLC: 4.96 L

## 2015-01-26 NOTE — Progress Notes (Signed)
PFT performed today. 

## 2015-01-30 ENCOUNTER — Ambulatory Visit (HOSPITAL_COMMUNITY): Payer: Medicare Other | Attending: Cardiology

## 2015-01-30 DIAGNOSIS — R011 Cardiac murmur, unspecified: Secondary | ICD-10-CM | POA: Diagnosis not present

## 2015-01-30 DIAGNOSIS — I351 Nonrheumatic aortic (valve) insufficiency: Secondary | ICD-10-CM | POA: Diagnosis not present

## 2015-01-30 DIAGNOSIS — I059 Rheumatic mitral valve disease, unspecified: Secondary | ICD-10-CM | POA: Insufficient documentation

## 2015-01-30 DIAGNOSIS — I350 Nonrheumatic aortic (valve) stenosis: Secondary | ICD-10-CM | POA: Insufficient documentation

## 2015-01-30 NOTE — Progress Notes (Signed)
2D Echo completed. 01/30/2015

## 2015-02-02 ENCOUNTER — Other Ambulatory Visit: Payer: Self-pay | Admitting: Internal Medicine

## 2015-02-02 DIAGNOSIS — I350 Nonrheumatic aortic (valve) stenosis: Secondary | ICD-10-CM

## 2015-02-10 ENCOUNTER — Ambulatory Visit (INDEPENDENT_AMBULATORY_CARE_PROVIDER_SITE_OTHER): Payer: Medicare Other | Admitting: Family Medicine

## 2015-02-10 ENCOUNTER — Encounter: Payer: Self-pay | Admitting: Family Medicine

## 2015-02-10 VITALS — BP 120/68 | HR 96 | Temp 97.5°F | Wt 136.0 lb

## 2015-02-10 DIAGNOSIS — J209 Acute bronchitis, unspecified: Secondary | ICD-10-CM | POA: Diagnosis not present

## 2015-02-10 DIAGNOSIS — J441 Chronic obstructive pulmonary disease with (acute) exacerbation: Secondary | ICD-10-CM

## 2015-02-10 MED ORDER — PREDNISONE 10 MG PO TABS: ORAL_TABLET | ORAL | Status: DC

## 2015-02-10 MED ORDER — DOXYCYCLINE HYCLATE 100 MG PO CAPS: 100.0000 mg | ORAL_CAPSULE | Freq: Two times a day (BID) | ORAL | Status: AC

## 2015-02-10 NOTE — Progress Notes (Signed)
Pre visit review using our clinic review tool, if applicable. No additional management support is needed unless otherwise documented below in the visit note. 

## 2015-02-10 NOTE — Progress Notes (Signed)
   Subjective:    Patient ID: Jessica [name], female    DOB: 1926-02-06, 81 y.o.   MRN: [id number]  HPI Here for 4 days of chest congestion, wheezing, and coughing up yellow sputum. No fever. She uses Advair bid and she has a nebulizer she can use at home prn.   Review of Systems  Constitutional: Negative.   HENT: Positive for congestion. Negative for postnasal drip and sinus pressure.   Eyes: Negative.   Respiratory: Positive for cough, chest tightness, shortness of breath and wheezing.   Cardiovascular: Negative.        Objective:   Physical Exam  Constitutional: She appears well-developed and well-nourished. No distress.  HENT:  Right Ear: External ear normal.  Left Ear: External ear normal.  Nose: Nose normal.  Mouth/Throat: Oropharynx is clear and moist.  Eyes: Conjunctivae are normal.  Pulmonary/Chest: Effort normal and breath sounds normal. No respiratory distress. She has no rales.  Scattered wheezes and rhonchi   Lymphadenopathy:    She has no cervical adenopathy.          Assessment & Plan:  Add Mucinex prn.

## 2015-02-22 ENCOUNTER — Ambulatory Visit: Payer: Medicare Other | Admitting: Cardiovascular Disease

## 2015-02-26 ENCOUNTER — Encounter: Payer: Self-pay | Admitting: Cardiovascular Disease

## 2015-02-26 ENCOUNTER — Ambulatory Visit (INDEPENDENT_AMBULATORY_CARE_PROVIDER_SITE_OTHER): Payer: Medicare Other | Admitting: Cardiovascular Disease

## 2015-02-26 VITALS — BP 130/62 | HR 63 | Ht 64.0 in | Wt 138.0 lb

## 2015-02-26 DIAGNOSIS — R0609 Other forms of dyspnea: Secondary | ICD-10-CM | POA: Insufficient documentation

## 2015-02-26 DIAGNOSIS — E785 Hyperlipidemia, unspecified: Secondary | ICD-10-CM

## 2015-02-26 DIAGNOSIS — R0602 Shortness of breath: Secondary | ICD-10-CM

## 2015-02-26 DIAGNOSIS — I2510 Atherosclerotic heart disease of native coronary artery without angina pectoris: Secondary | ICD-10-CM

## 2015-02-26 DIAGNOSIS — I350 Nonrheumatic aortic (valve) stenosis: Secondary | ICD-10-CM | POA: Insufficient documentation

## 2015-02-26 DIAGNOSIS — J441 Chronic obstructive pulmonary disease with (acute) exacerbation: Secondary | ICD-10-CM | POA: Diagnosis not present

## 2015-02-26 MED ORDER — SIMVASTATIN 20 MG PO TABS: 20.0000 mg | ORAL_TABLET | Freq: Every day | ORAL | Status: DC

## 2015-02-26 MED ORDER — ALBUTEROL SULFATE HFA 108 (90 BASE) MCG/ACT IN AERS: INHALATION_SPRAY | RESPIRATORY_TRACT | Status: DC

## 2015-02-26 NOTE — Assessment & Plan Note (Signed)
Mild to moderate aortic valve stenosis. Repeat echocardiogram in a few years. Will likely have slow progression. This could contribute to mild shortness of breath in addition to her asthmatic bronchitis, COPD

## 2015-02-26 NOTE — Progress Notes (Signed)
Patient ID: Jessica [name], female    DOB: Jun 11, 1926, 81 y.o.   MRN: [id number]  HPI Comments: Jessica [name]  is an 81 y/o woman with mild to moderate aortic valve stenosis, h/o HL, fibromyalgia, anxiety and moderate COPD/emphysema, asthmatic bronchitis Previous smoking history but stopped over 30 years ago. History of total knee replacement July 2013 by her report Mild gait instability, walks with a cane. Previous history of falls with trauma to her face/forehead requiring stitches. No recent falls She presents for follow-up of her aortic valve disease and shortness of breath  In follow-up, she continues to be busy, continues to work in Production assistant, radio working full time. Reports that she is helping to take care of 2 grandchildren. She does have some shortness of breath with heavy exertion, some days are worse than others. She has albuterol, nebulizer, Advair.  Echocardiogram April 2016 showing mild to moderate aortic valve stenosis Carotid ultrasound November 2015 showing mild bilateral carotid disease Results were discussed with her of both studies  EKG shows normal sinus rhythm with rate 63 bpm, no significant ST or T-wave changes   Other past medical history Previous trip to the ER with CP in 8/11.  Had post hospital Myoview in 9/11 with normal EF and question of inferior ischemia.   medical therapy pursued at that time. Asymptomatic since then  No recurrent CP or undue dyspnea. Does get fatigued. No palpitations, CHF or syncope.      No Known Allergies  Current Outpatient Prescriptions on File Prior to Visit  Medication Sig Dispense Refill  . albuterol (PROVENTIL) (2.5 MG/3ML) 0.083% nebulizer solution Take 3 mLs (2.5 mg total) by nebulization 3 (three) times daily. 270 mL 3  . ALPRAZolam (XANAX) 0.5 MG tablet TAKE ONE HALF TO ONE TABLET BY MOUTH THREE TIMES DAILY AS NEEDED for nerves. Do not exceed 3 per day. 90 tablet 3  . aspirin 81 MG tablet Take 81 mg by mouth daily.       . Calcium Carbonate-Vitamin D (CALTRATE 600+D) 600-400 MG-UNIT per tablet Take 1 tablet by mouth daily.      . Cholecalciferol (VITAMIN D) 1000 UNITS capsule Take 1,000 Units by mouth daily.      Marland Kitchen escitalopram (LEXAPRO) 20 MG tablet Take 1 tablet (20 mg total) by mouth daily. 30 tablet 4  . Fluticasone-Salmeterol (ADVAIR) 250-50 MCG/DOSE AEPB Inhale 1 puff into the lungs 2 (two) times daily. 60 each 5  . HYDROcodone-acetaminophen (VICODIN) 5-500 MG per tablet Take 1 tablet by mouth every 6 (six) hours as needed for pain.     . polyethylene glycol powder (GLYCOLAX/MIRALAX) powder Take 17 g by mouth 2 (two) times daily as needed. 3350 g 3   No current facility-administered medications on file prior to visit.    Past Medical History  Diagnosis Date  . congenital nystagmus   . Acute asthmatic bronchitis   . Venous insufficiency   . Atypical chest pain   . Other and unspecified hyperlipidemia   . DJD (degenerative joint disease)   . Low back pain syndrome   . Fibromyalgia   . Memory loss   . Anxiety     Past Surgical History  Procedure Laterality Date  . Cataract extraction    . Abdominal hysterectomy    . Anterior cervical discectomy    . Lumbar laminectomy    . Right knee arthroscopy    . Right shoulder surgery  2008    Dr. Noemi Chapel  . Right shoulder replacement  04/2009  . Carpal tunnel release  12/2011    right arm    Social History  reports that she quit smoking about 23 years ago. Her smoking use included Cigarettes. She has a 60 pack-year smoking history. She does not have any smokeless tobacco history on file. She reports that she does not drink alcohol or use illicit drugs.  Family History Family history is unknown by patient.   Review of Systems  Constitutional: Negative.   Respiratory: Negative.   Cardiovascular: Negative.   Gastrointestinal: Negative.   Musculoskeletal: Negative.   Skin: Negative.   Neurological: Negative.   Hematological: Negative.    Psychiatric/Behavioral: Negative.   All other systems reviewed and are negative.   BP 130/62 mmHg  Pulse 63  Ht 5\' 4"  (1.626 m)  Wt 138 lb (62.596 kg)  BMI 23.68 kg/m2  Physical Exam  Constitutional: She is oriented to person, place, and time. She appears well-developed and well-nourished.  HENT:  Head: Normocephalic.  Nose: Nose normal.  Mouth/Throat: Oropharynx is clear and moist.  Eyes: Conjunctivae are normal. Pupils are equal, round, and reactive to light.  Neck: Normal range of motion. Neck supple. No JVD present.  Cardiovascular: Normal rate, regular rhythm, S1 normal, S2 normal and intact distal pulses.  Exam reveals no gallop and no friction rub.   Murmur heard.  Systolic murmur is present with a grade of 2/6  Pulmonary/Chest: Effort normal and breath sounds normal. No respiratory distress. She has no wheezes. She has no rales. She exhibits no tenderness.  Abdominal: Soft. Bowel sounds are normal. She exhibits no distension. There is no tenderness.  Musculoskeletal: Normal range of motion. She exhibits no edema or tenderness.  Lymphadenopathy:    She has no cervical adenopathy.  Neurological: She is alert and oriented to person, place, and time. Coordination normal.  Skin: Skin is warm and dry. No rash noted. No erythema.  Psychiatric: She has a normal mood and affect. Her behavior is normal. Judgment and thought content normal.    Assessment and Plan  Nursing note and vitals reviewed.

## 2015-02-26 NOTE — Assessment & Plan Note (Signed)
Waxing waning episodes of shortness of breath, she reports stable recently Moderate COPD, emphysema on CT scan

## 2015-02-26 NOTE — Assessment & Plan Note (Signed)
Recommended she restart her simvastatin 20 mg daily Coronary artery disease seen on CT scan. Mild carotid arterial disease Goal LDL ideally less than 70

## 2015-02-26 NOTE — Patient Instructions (Signed)
You are doing well.  Please restart the simvastatin one a day  Please call us if you have new issues that need to be addressed before your next appt.  Your physician wants you to follow-up in: 12 months.  You will receive a reminder letter in the mail two months in advance. If you don't receive a letter, please call our office to schedule the follow-up appointment.

## 2015-02-26 NOTE — Assessment & Plan Note (Signed)
Currently with no symptoms of angina. No further workup at this time. Continue current medication regimen. Mild carotid artery disease seen on CT scan

## 2015-02-26 NOTE — Assessment & Plan Note (Signed)
Chronic issue, multifactorial including COPD, asthma, mild to moderate aortic valve stenosis, unable to exclude deconditioning

## 2015-04-12 ENCOUNTER — Other Ambulatory Visit: Payer: Self-pay | Admitting: Internal Medicine

## 2015-04-13 NOTE — Telephone Encounter (Signed)
Okay to refill? 

## 2015-04-18 NOTE — Telephone Encounter (Signed)
Pt has a few pill left

## 2015-05-07 ENCOUNTER — Encounter: Payer: Self-pay | Admitting: Internal Medicine

## 2015-05-07 ENCOUNTER — Ambulatory Visit (INDEPENDENT_AMBULATORY_CARE_PROVIDER_SITE_OTHER): Payer: Medicare Other | Admitting: Internal Medicine

## 2015-05-07 VITALS — BP 130/78 | HR 93 | Wt 134.0 lb

## 2015-05-07 DIAGNOSIS — F411 Generalized anxiety disorder: Secondary | ICD-10-CM

## 2015-05-07 DIAGNOSIS — J449 Chronic obstructive pulmonary disease, unspecified: Secondary | ICD-10-CM | POA: Insufficient documentation

## 2015-05-07 DIAGNOSIS — I350 Nonrheumatic aortic (valve) stenosis: Secondary | ICD-10-CM

## 2015-05-07 DIAGNOSIS — I2510 Atherosclerotic heart disease of native coronary artery without angina pectoris: Secondary | ICD-10-CM | POA: Diagnosis not present

## 2015-05-07 DIAGNOSIS — N907 Vulvar cyst: Secondary | ICD-10-CM

## 2015-05-07 DIAGNOSIS — J439 Emphysema, unspecified: Secondary | ICD-10-CM

## 2015-05-07 MED ORDER — FLUTICASONE-SALMETEROL 250-50 MCG/DOSE IN AEPB: 1.0000 | INHALATION_SPRAY | Freq: Two times a day (BID) | RESPIRATORY_TRACT | Status: DC

## 2015-05-07 MED ORDER — ESCITALOPRAM OXALATE 20 MG PO TABS: 20.0000 mg | ORAL_TABLET | Freq: Every day | ORAL | Status: DC

## 2015-05-07 NOTE — Patient Instructions (Signed)
Please complete the following lab tests before your next follow up appointment: FLP, BMET, LFTs - 272.4 CBCD - use COPD code

## 2015-05-07 NOTE — Assessment & Plan Note (Signed)
Stable / compensated.  Continue same dose of Advair.  I stressed importance of avoiding 2nd hand smoke.

## 2015-05-07 NOTE — Assessment & Plan Note (Signed)
Stable.  Continue same dose of Lexapro 20 mg.  Use alprazolam as needed.

## 2015-05-07 NOTE — Assessment & Plan Note (Signed)
Continue simvastatin. Monitor LFTs.

## 2015-05-07 NOTE — Assessment & Plan Note (Signed)
Refer to GYN for further evaluation / treatment.

## 2015-05-07 NOTE — Progress Notes (Signed)
Subjective:    Patient ID: Jessica [name], female    DOB: 1926-07-14, 81 y.o.   MRN: [id number]  HPI  81 year old white female for follow-up regarding heart murmur, COPD and anxiety. Interval medical history patient seen by cardiology.  2-D echocardiogram completed.Patient found to have mild to moderate aortic valve stenosis. Valve area (VTI): 1.09 cm^2. Valve area(Vmean): 0.93 cm^2.   COPD-her breathing is at baseline. She is exposed to secondhand smoke. Her daughter lives with patient and she is a smoker.  She is using Advair regularly.  She notes anxiety getting better.  She uses Lexapro and alprazolam at bedtime as needed  She also complains of labial cyst. She would like to see GYN.  Review of Systems Negative for chest pain.  Chronic dyspnea unchanged. Negative for cough    Past Medical History  Diagnosis Date  . congenital nystagmus   . Acute asthmatic bronchitis   . Venous insufficiency   . Atypical chest pain   . Other and unspecified hyperlipidemia   . DJD (degenerative joint disease)   . Low back pain syndrome   . Fibromyalgia   . Memory loss   . Anxiety     History   Social History  . Marital Status: Widowed    Spouse Name: N/A  . Number of Children: 3  . Years of Education: N/A   Occupational History  .     Social History Main Topics  . Smoking status: Former Smoker -- 2.00 packs/day for 30 years    Types: Cigarettes    Quit date: 10/28/1991  . Smokeless tobacco: Not on file  . Alcohol Use: No  . Drug Use: No  . Sexual Activity: Not on file   Other Topics Concern  . Not on file   Social History Narrative   1 sibling alive age 31   1 sibling alive age 58   1 sibling deceased age 59   1 sibling alive age 71    Past Surgical History  Procedure Laterality Date  . Cataract extraction    . Abdominal hysterectomy    . Anterior cervical discectomy    . Lumbar laminectomy    . Right knee arthroscopy    . Right shoulder surgery  2008   Dr. Noemi Chapel  . Right shoulder replacement  04/2009  . Carpal tunnel release  12/2011    right arm    Family History  Problem Relation Age of Onset  . Family history unknown: Yes    No Known Allergies  Current Outpatient Prescriptions on File Prior to Visit  Medication Sig Dispense Refill  . albuterol (PROVENTIL HFA;VENTOLIN HFA) 108 (90 BASE) MCG/ACT inhaler 1-2 puffs every 4 hours as needed 1 Inhaler 3  . albuterol (PROVENTIL) (2.5 MG/3ML) 0.083% nebulizer solution Take 3 mLs (2.5 mg total) by nebulization 3 (three) times daily. 270 mL 3  . ALPRAZolam (XANAX) 0.5 MG tablet TAKE 1/2-1 TABLET BY MOUTH THREE TIMES DAILY AS NEEDED FOR NERVE(MAXIUMUM 3 A DAY) 90 tablet 2  . aspirin 81 MG tablet Take 81 mg by mouth daily.      . Calcium Carbonate-Vitamin D (CALTRATE 600+D) 600-400 MG-UNIT per tablet Take 1 tablet by mouth daily.      . Cholecalciferol (VITAMIN D) 1000 UNITS capsule Take 1,000 Units by mouth daily.      Marland Kitchen escitalopram (LEXAPRO) 20 MG tablet Take 1 tablet (20 mg total) by mouth daily. 30 tablet 4  . Fluticasone-Salmeterol (ADVAIR) 250-50 MCG/DOSE AEPB Inhale 1  puff into the lungs 2 (two) times daily. 60 each 5  . HYDROcodone-acetaminophen (VICODIN) 5-500 MG per tablet Take 1 tablet by mouth every 6 (six) hours as needed for pain.     . polyethylene glycol powder (GLYCOLAX/MIRALAX) powder Take 17 g by mouth 2 (two) times daily as needed. 3350 g 3  . simvastatin (ZOCOR) 20 MG tablet Take 1 tablet (20 mg total) by mouth at bedtime. 90 tablet 3   No current facility-administered medications on file prior to visit.    BP 130/78 mmHg  Pulse 93  Wt 134 lb (60.782 kg)  SpO2 93%    Objective:   Physical Exam  Constitutional: She is oriented to person, place, and time. She appears well-developed and well-nourished.  HENT:  Head: Normocephalic and atraumatic.  Neck: Neck supple.  Cardiovascular: Normal rate and regular rhythm.   SEM II/VI RSB   Pulmonary/Chest: Effort normal.   Prolonged expiration.  Decrease BS throughout  Neurological: She is alert and oriented to person, place, and time. No cranial nerve deficit.  Skin: Skin is warm and dry.  Psychiatric: She has a normal mood and affect. Her behavior is normal.          Assessment & Plan:

## 2015-05-07 NOTE — Assessment & Plan Note (Signed)
Followed by Dr. [name].  Plan to repeat 2D echo in 1-2 years.

## 2015-05-07 NOTE — Progress Notes (Signed)
Pre visit review using our clinic review tool, if applicable. No additional management support is needed unless otherwise documented below in the visit note. 

## 2015-05-24 DIAGNOSIS — Z1289 Encounter for screening for malignant neoplasm of other sites: Secondary | ICD-10-CM | POA: Diagnosis not present

## 2015-05-31 DIAGNOSIS — H3532 Exudative age-related macular degeneration: Secondary | ICD-10-CM | POA: Diagnosis not present

## 2015-06-01 DIAGNOSIS — H3532 Exudative age-related macular degeneration: Secondary | ICD-10-CM | POA: Diagnosis not present

## 2015-06-01 DIAGNOSIS — H35372 Puckering of macula, left eye: Secondary | ICD-10-CM | POA: Diagnosis not present

## 2015-06-01 DIAGNOSIS — H43813 Vitreous degeneration, bilateral: Secondary | ICD-10-CM | POA: Diagnosis not present

## 2015-06-01 DIAGNOSIS — H31011 Macula scars of posterior pole (postinflammatory) (post-traumatic), right eye: Secondary | ICD-10-CM | POA: Diagnosis not present

## 2015-07-11 DIAGNOSIS — H10012 Acute follicular conjunctivitis, left eye: Secondary | ICD-10-CM | POA: Diagnosis not present

## 2015-07-20 ENCOUNTER — Other Ambulatory Visit: Payer: Self-pay | Admitting: Internal Medicine

## 2015-07-25 ENCOUNTER — Telehealth: Payer: Self-pay | Admitting: Internal Medicine

## 2015-07-25 NOTE — Telephone Encounter (Signed)
Pt needs a refill on alprazolam. Call into [facility] on [address]

## 2015-07-25 NOTE — Telephone Encounter (Signed)
Ok to RF x 1.  She will need to see another provider next month if she needs additional refills

## 2015-07-26 MED ORDER — ALPRAZOLAM 0.5 MG PO TABS: ORAL_TABLET | ORAL | Status: DC

## 2015-07-30 DIAGNOSIS — H353211 Exudative age-related macular degeneration, right eye, with active choroidal neovascularization: Secondary | ICD-10-CM | POA: Diagnosis not present

## 2015-07-30 DIAGNOSIS — H353112 Nonexudative age-related macular degeneration, right eye, intermediate dry stage: Secondary | ICD-10-CM | POA: Diagnosis not present

## 2015-08-08 ENCOUNTER — Ambulatory Visit: Payer: PRIVATE HEALTH INSURANCE | Admitting: Internal Medicine

## 2015-08-09 ENCOUNTER — Encounter (HOSPITAL_COMMUNITY): Payer: Self-pay | Admitting: *Deleted

## 2015-08-09 ENCOUNTER — Emergency Department (INDEPENDENT_AMBULATORY_CARE_PROVIDER_SITE_OTHER)
Admission: EM | Admit: 2015-08-09 | Discharge: 2015-08-09 | Disposition: A | Discharge status: Home / Self Care | Payer: Medicare Other | Source: Home / Self Care

## 2015-08-09 DIAGNOSIS — S61219A Laceration without foreign body of unspecified finger without damage to nail, initial encounter: Secondary | ICD-10-CM | POA: Diagnosis not present

## 2015-08-09 NOTE — ED Provider Notes (Signed)
CSN: [id number]     Arrival date & time 08/09/15  1310 History   None    Chief Complaint  Patient presents with  . Hand Injury   (Consider location/radiation/quality/duration/timing/severity/associated sxs/prior Treatment) HPI Comments: A 81 year old Caucasian female presents to urgent care complaining of lacerations to her third and fourth right fingers. She excellently slammed her fingers in a car door possibly 2 hours prior to presentation. She is able to move both fingers and her hand without difficulty. She states that the wound on the top of her right third finger initially bled but stopped bleeding on its own. She denies significant pain. In fact, she states her fingers are slightly numb.  Patient is a 81 y.o. female presenting with hand injury. The history is provided by the patient.  Hand Injury Location:  Finger Time since incident:  2 hours Injury: yes   Finger location:  R middle finger and R ring finger Pain details:    Quality: numb. Chronicity:  New Dislocation: no   Foreign body present:  No foreign bodies Tetanus status:  Up to date Prior injury to area:  No Worsened by:  Nothing tried Ineffective treatments:  None tried Associated symptoms: numbness   Associated symptoms: no fever     Past Medical History  Diagnosis Date  . congenital nystagmus   . Acute asthmatic bronchitis   . Venous insufficiency   . Atypical chest pain   . Other and unspecified hyperlipidemia   . DJD (degenerative joint disease)   . Low back pain syndrome   . Fibromyalgia   . Memory loss   . Anxiety    Past Surgical History  Procedure Laterality Date  . Cataract extraction    . Abdominal hysterectomy    . Anterior cervical discectomy    . Lumbar laminectomy    . Right knee arthroscopy    . Right shoulder surgery  2008    Dr. Noemi Chapel  . Right shoulder replacement  04/2009  . Carpal tunnel release  12/2011    right arm   Family History  Problem Relation Age of Onset  . Family  history unknown: Yes   Social History  Substance Use Topics  . Smoking status: Former Smoker -- 2.00 packs/day for 30 years    Types: Cigarettes    Quit date: 10/28/1991  . Smokeless tobacco: None  . Alcohol Use: No   OB History    No data available     Review of Systems  Constitutional: Negative for fever.  Respiratory: Negative for cough, chest tightness and shortness of breath.   Cardiovascular: Negative for chest pain.  Gastrointestinal: Negative for nausea, vomiting, abdominal pain and diarrhea.  Genitourinary: Negative for dysuria.  Musculoskeletal: Negative for joint swelling.  Skin: Positive for wound.  Neurological: Negative for dizziness and light-headedness.    Allergies  Review of patient's allergies indicates no known allergies.  Home Medications   Prior to Admission medications   Medication Sig Start Date End Date Taking? Authorizing Provider  albuterol (PROVENTIL HFA;VENTOLIN HFA) 108 (90 BASE) MCG/ACT inhaler 1-2 puffs every 4 hours as needed 02/26/15   [name], MD  albuterol (PROVENTIL) (2.5 MG/3ML) 0.083% nebulizer solution Take 3 mLs (2.5 mg total) by nebulization 3 (three) times daily. 03/07/14   Noralee Space, MD  ALPRAZolam Duanne Moron) 0.5 MG tablet TAKE 1/2-1 TABLET BY MOUTH THREE TIMES DAILY AS NEEDED FOR NERVE(MAXIUMUM 3 A DAY) 07/26/15   Doe-Hyun R Shawna Orleans, DO  aspirin 81 MG tablet Take  81 mg by mouth daily.      Historical Provider, MD  Calcium Carbonate-Vitamin D (CALTRATE 600+D) 600-400 MG-UNIT per tablet Take 1 tablet by mouth daily.      Historical Provider, MD  Cholecalciferol (VITAMIN D) 1000 UNITS capsule Take 1,000 Units by mouth daily.      Historical Provider, MD  escitalopram (LEXAPRO) 20 MG tablet Take 1 tablet (20 mg total) by mouth daily. 05/07/15   Doe-Hyun R Shawna Orleans, DO  Fluticasone-Salmeterol (ADVAIR) 250-50 MCG/DOSE AEPB Inhale 1 puff into the lungs 2 (two) times daily. 05/07/15   Doe-Hyun R Shawna Orleans, DO  HYDROcodone-acetaminophen (VICODIN) 5-500  MG per tablet Take 1 tablet by mouth every 6 (six) hours as needed for pain.     Historical Provider, MD  polyethylene glycol powder (GLYCOLAX/MIRALAX) powder Take 17 g by mouth 2 (two) times daily as needed. 01/24/15   Doe-Hyun R Shawna Orleans, DO  simvastatin (ZOCOR) 20 MG tablet Take 1 tablet (20 mg total) by mouth at bedtime. 02/26/15   [name], MD   Meds Ordered and Administered this Visit  Medications - No data to display  BP 143/58 mmHg  Pulse 65  Temp(Src) 97.9 F (36.6 C) (Oral)  Resp 16  SpO2 98% No data found.   Physical Exam  Constitutional: She is oriented to person, place, and time. She appears well-developed and well-nourished. No distress.  HENT:  Head: Normocephalic and atraumatic.  Mouth/Throat: Oropharynx is clear and moist.  Eyes: Conjunctivae and EOM are normal. Pupils are equal, round, and reactive to light. No scleral icterus.  Neck: Normal range of motion. Neck supple.  Cardiovascular: Normal rate and regular rhythm.  Exam reveals no gallop and no friction rub.   Murmur heard.  Systolic murmur is present with a grade of 3/6  Radiates to carotid  Pulmonary/Chest: Effort normal and breath sounds normal.  Abdominal: Soft. Bowel sounds are normal. She exhibits no distension. There is no tenderness.  Musculoskeletal: Normal range of motion. She exhibits no edema.       Hands: Bruising present palmar side of third and fourth right fingers  Neurological: She is alert and oriented to person, place, and time. No cranial nerve deficit.  Skin: Skin is warm and dry.  Psychiatric: She has a normal mood and affect. Her behavior is normal. Judgment and thought content normal.    ED Course  Procedures (including critical care time)  Labs Review Labs Reviewed - No data to display  Imaging Review No results found.   Visual Acuity Review  Right Eye Distance:   Left Eye Distance:   Bilateral Distance:    Right Eye Near:   Left Eye Near:    Bilateral Near:          MDM   1. Laceration of finger of right hand, initial encounter    He lacerations to third and fourth right digit with well approximated edges and no foreign bodies within wound. Good range of motion. No apparent tendon damage. No joint involvement. Wounds clean and dressed in clinic. Antibiotic ointment applied. Wound care instructions provided. Follow-up with primary care doctor.    Harrie Foreman, MD 08/09/15 [phone]

## 2015-08-09 NOTE — ED Notes (Addendum)
Pt  Reports  She  Sustained  An injury  To  Her  r  Ring  Finger      Slammed  In  Car  Door    Lac  Present      [t  denys  Any  Other    Injury    Ambulated  To  Room

## 2015-08-09 NOTE — Discharge Instructions (Signed)
Laceration Care, Adult  A laceration is a cut that goes through all layers of the skin. The cut also goes into the tissue that is right under the skin. Some cuts heal on their own. Others need to be closed with stitches (sutures), staples, skin adhesive strips, or wound glue. Taking care of your cut lowers your risk of infection and helps your cut to heal better.  HOW TO TAKE CARE OF YOUR CUT  For stitches or staples:  · Keep the wound clean and dry.  · If you were given a bandage (dressing), you should change it at least one time per day or as told by your doctor. You should also change it if it gets wet or dirty.  · Keep the wound completely dry for the first 24 hours or as told by your doctor. After that time, you may take a shower or a bath. However, make sure that the wound is not soaked in water until after the stitches or staples have been removed.  · Clean the wound one time each day or as told by your doctor:    Wash the wound with soap and water.    Rinse the wound with water until all of the soap comes off.    Pat the wound dry with a clean towel. Do not rub the wound.  · After you clean the wound, put a thin layer of antibiotic ointment on it as told by your doctor. This ointment:    Helps to prevent infection.    Keeps the bandage from sticking to the wound.  · Have your stitches or staples removed as told by your doctor.  If your doctor used skin adhesive strips:   · Keep the wound clean and dry.  · If you were given a bandage, you should change it at least one time per day or as told by your doctor. You should also change it if it gets dirty or wet.  · Do not get the skin adhesive strips wet. You can take a shower or a bath, but be careful to keep the wound dry.  · If the wound gets wet, pat it dry with a clean towel. Do not rub the wound.  · Skin adhesive strips fall off on their own. You can trim the strips as the wound heals. Do not remove any strips that are still stuck to the wound. They will  fall off after a while.  If your doctor used wound glue:  · Try to keep your wound dry, but you may briefly wet it in the shower or bath. Do not soak the wound in water, such as by swimming.  · After you take a shower or a bath, gently pat the wound dry with a clean towel. Do not rub the wound.  · Do not do any activities that will make you really sweaty until the skin glue has fallen off on its own.  · Do not apply liquid, cream, or ointment medicine to your wound while the skin glue is still on.  · If you were given a bandage, you should change it at least one time per day or as told by your doctor. You should also change it if it gets dirty or wet.  · If a bandage is placed over the wound, do not let the tape for the bandage touch the skin glue.  · Do not pick at the glue. The skin glue usually stays on for 5-10 days. Then, it   falls off of the skin.  General Instructions   · To help prevent scarring, make sure to cover your wound with sunscreen whenever you are outside after stitches are removed, after adhesive strips are removed, or when wound glue stays in place and the wound is healed. Make sure to wear a sunscreen of at least 30 SPF.  · Take over-the-counter and prescription medicines only as told by your doctor.  · If you were given antibiotic medicine or ointment, take or apply it as told by your doctor. Do not stop using the antibiotic even if your wound is getting better.  · Do not scratch or pick at the wound.  · Keep all follow-up visits as told by your doctor. This is important.  · Check your wound every day for signs of infection. Watch for:    Redness, swelling, or pain.    Fluid, blood, or pus.  · Raise (elevate) the injured area above the level of your heart while you are sitting or lying down, if possible.  GET HELP IF:  · You got a tetanus shot and you have any of these problems at the injection site:    Swelling.    Very bad pain.    Redness.    Bleeding.  · You have a fever.  · A wound that was  closed breaks open.  · You notice a bad smell coming from your wound or your bandage.  · You notice something coming out of the wound, such as wood or glass.  · Medicine does not help your pain.  · You have more redness, swelling, or pain at the site of your wound.  · You have fluid, blood, or pus coming from your wound.  · You notice a change in the color of your skin near your wound.  · You need to change the bandage often because fluid, blood, or pus is coming from the wound.  · You start to have a new rash.  · You start to have numbness around the wound.  GET HELP RIGHT AWAY IF:  · You have very bad swelling around the wound.  · Your pain suddenly gets worse and is very bad.  · You notice painful lumps near the wound or on skin that is anywhere on your body.  · You have a red streak going away from your wound.  · The wound is on your hand or foot and you cannot move a finger or toe like you usually can.  · The wound is on your hand or foot and you notice that your fingers or toes look pale or bluish.     This information is not intended to replace advice given to you by your health care provider. Make sure you discuss any questions you have with your health care provider.     Document Released: 03/31/2008 Document Revised: 02/27/2015 Document Reviewed: 10/09/2014  Elsevier Interactive Patient Education ©2016 [employer]

## 2015-08-13 ENCOUNTER — Ambulatory Visit: Payer: PRIVATE HEALTH INSURANCE | Admitting: Internal Medicine

## 2015-08-13 DIAGNOSIS — Z0289 Encounter for other administrative examinations: Secondary | ICD-10-CM

## 2015-08-21 DIAGNOSIS — H6123 Impacted cerumen, bilateral: Secondary | ICD-10-CM | POA: Diagnosis not present

## 2015-08-21 DIAGNOSIS — H903 Sensorineural hearing loss, bilateral: Secondary | ICD-10-CM | POA: Diagnosis not present

## 2015-08-22 DIAGNOSIS — M25462 Effusion, left knee: Secondary | ICD-10-CM | POA: Diagnosis not present

## 2015-08-22 DIAGNOSIS — M85862 Other specified disorders of bone density and structure, left lower leg: Secondary | ICD-10-CM | POA: Diagnosis not present

## 2015-08-22 DIAGNOSIS — M25562 Pain in left knee: Secondary | ICD-10-CM | POA: Diagnosis not present

## 2015-08-22 DIAGNOSIS — Z96651 Presence of right artificial knee joint: Secondary | ICD-10-CM | POA: Diagnosis not present

## 2015-08-22 DIAGNOSIS — M1712 Unilateral primary osteoarthritis, left knee: Secondary | ICD-10-CM | POA: Diagnosis not present

## 2015-08-22 DIAGNOSIS — M179 Osteoarthritis of knee, unspecified: Secondary | ICD-10-CM | POA: Diagnosis not present

## 2015-08-25 ENCOUNTER — Other Ambulatory Visit: Payer: Self-pay | Admitting: Internal Medicine

## 2015-08-30 ENCOUNTER — Telehealth: Payer: Self-pay | Admitting: Internal Medicine

## 2015-08-30 NOTE — Telephone Encounter (Signed)
Pt request refill of the following: ALPRAZolam (XANAX) 0.5 MG tablet   Phamacy:  [facility] [address] [place]

## 2015-08-31 MED ORDER — ALPRAZOLAM 0.5 MG PO TABS
ORAL_TABLET | ORAL | Status: DC
Start: 2015-08-31 — End: 2015-11-01

## 2015-08-31 NOTE — Telephone Encounter (Signed)
Ok to RF x 2 

## 2015-08-31 NOTE — Telephone Encounter (Signed)
Rx called in 

## 2015-09-08 DIAGNOSIS — M25562 Pain in left knee: Secondary | ICD-10-CM | POA: Diagnosis not present

## 2015-09-08 DIAGNOSIS — M11262 Other chondrocalcinosis, left knee: Secondary | ICD-10-CM | POA: Diagnosis not present

## 2015-09-08 DIAGNOSIS — S83282A Other tear of lateral meniscus, current injury, left knee, initial encounter: Secondary | ICD-10-CM | POA: Diagnosis not present

## 2015-09-08 DIAGNOSIS — S83242A Other tear of medial meniscus, current injury, left knee, initial encounter: Secondary | ICD-10-CM | POA: Diagnosis not present

## 2015-09-08 DIAGNOSIS — M6588 Other synovitis and tenosynovitis, other site: Secondary | ICD-10-CM | POA: Diagnosis not present

## 2015-09-08 DIAGNOSIS — M179 Osteoarthritis of knee, unspecified: Secondary | ICD-10-CM | POA: Diagnosis not present

## 2015-09-18 DIAGNOSIS — Z7982 Long term (current) use of aspirin: Secondary | ICD-10-CM | POA: Diagnosis not present

## 2015-09-18 DIAGNOSIS — Z96651 Presence of right artificial knee joint: Secondary | ICD-10-CM | POA: Diagnosis not present

## 2015-09-18 DIAGNOSIS — Z87891 Personal history of nicotine dependence: Secondary | ICD-10-CM | POA: Diagnosis not present

## 2015-09-18 DIAGNOSIS — Z471 Aftercare following joint replacement surgery: Secondary | ICD-10-CM | POA: Diagnosis not present

## 2015-09-18 DIAGNOSIS — M170 Bilateral primary osteoarthritis of knee: Secondary | ICD-10-CM | POA: Diagnosis not present

## 2015-09-24 DIAGNOSIS — H353113 Nonexudative age-related macular degeneration, right eye, advanced atrophic without subfoveal involvement: Secondary | ICD-10-CM | POA: Diagnosis not present

## 2015-09-24 DIAGNOSIS — H43813 Vitreous degeneration, bilateral: Secondary | ICD-10-CM | POA: Diagnosis not present

## 2015-09-24 DIAGNOSIS — H353221 Exudative age-related macular degeneration, left eye, with active choroidal neovascularization: Secondary | ICD-10-CM | POA: Diagnosis not present

## 2015-10-03 DIAGNOSIS — M1712 Unilateral primary osteoarthritis, left knee: Secondary | ICD-10-CM | POA: Diagnosis not present

## 2015-10-03 DIAGNOSIS — Z23 Encounter for immunization: Secondary | ICD-10-CM | POA: Diagnosis not present

## 2015-10-03 DIAGNOSIS — Z96651 Presence of right artificial knee joint: Secondary | ICD-10-CM | POA: Diagnosis not present

## 2015-10-10 DIAGNOSIS — M1712 Unilateral primary osteoarthritis, left knee: Secondary | ICD-10-CM | POA: Diagnosis not present

## 2015-10-17 DIAGNOSIS — M1712 Unilateral primary osteoarthritis, left knee: Secondary | ICD-10-CM | POA: Diagnosis not present

## 2015-11-01 ENCOUNTER — Other Ambulatory Visit: Payer: Self-pay | Admitting: Internal Medicine

## 2015-11-22 NOTE — Telephone Encounter (Signed)
This refill has been pending since 11/01/15. Pt had appointment on 1/27 and was going to address at this time. Can you please refill until she next appointment 12/19/15? [facility] / [address]

## 2015-11-23 ENCOUNTER — Ambulatory Visit: Payer: Medicare Other | Admitting: Internal Medicine

## 2015-11-28 NOTE — Telephone Encounter (Signed)
Pt is out and daughter is having a hard time with her. Can you refill this?  Again, pending since Jan. Thank you!!

## 2015-11-29 NOTE — Telephone Encounter (Signed)
Rx sent 

## 2015-12-11 DIAGNOSIS — S299XXA Unspecified injury of thorax, initial encounter: Secondary | ICD-10-CM | POA: Diagnosis not present

## 2015-12-11 DIAGNOSIS — S42001A Fracture of unspecified part of right clavicle, initial encounter for closed fracture: Secondary | ICD-10-CM | POA: Diagnosis not present

## 2015-12-11 DIAGNOSIS — S42021A Displaced fracture of shaft of right clavicle, initial encounter for closed fracture: Secondary | ICD-10-CM | POA: Diagnosis not present

## 2015-12-11 DIAGNOSIS — Z043 Encounter for examination and observation following other accident: Secondary | ICD-10-CM | POA: Diagnosis not present

## 2015-12-11 DIAGNOSIS — I10 Essential (primary) hypertension: Secondary | ICD-10-CM | POA: Diagnosis not present

## 2015-12-11 DIAGNOSIS — F329 Major depressive disorder, single episode, unspecified: Secondary | ICD-10-CM | POA: Diagnosis not present

## 2015-12-11 DIAGNOSIS — S4991XA Unspecified injury of right shoulder and upper arm, initial encounter: Secondary | ICD-10-CM | POA: Diagnosis not present

## 2015-12-11 DIAGNOSIS — W19XXXA Unspecified fall, initial encounter: Secondary | ICD-10-CM | POA: Diagnosis not present

## 2015-12-11 DIAGNOSIS — J449 Chronic obstructive pulmonary disease, unspecified: Secondary | ICD-10-CM | POA: Diagnosis not present

## 2015-12-11 DIAGNOSIS — M542 Cervicalgia: Secondary | ICD-10-CM | POA: Diagnosis not present

## 2015-12-11 DIAGNOSIS — F419 Anxiety disorder, unspecified: Secondary | ICD-10-CM | POA: Diagnosis not present

## 2015-12-12 DIAGNOSIS — S42001A Fracture of unspecified part of right clavicle, initial encounter for closed fracture: Secondary | ICD-10-CM | POA: Diagnosis not present

## 2015-12-12 DIAGNOSIS — S42021A Displaced fracture of shaft of right clavicle, initial encounter for closed fracture: Secondary | ICD-10-CM | POA: Diagnosis present

## 2015-12-12 DIAGNOSIS — I951 Orthostatic hypotension: Secondary | ICD-10-CM | POA: Diagnosis not present

## 2015-12-12 DIAGNOSIS — F329 Major depressive disorder, single episode, unspecified: Secondary | ICD-10-CM | POA: Diagnosis present

## 2015-12-12 DIAGNOSIS — R0781 Pleurodynia: Secondary | ICD-10-CM | POA: Diagnosis not present

## 2015-12-12 DIAGNOSIS — Z7982 Long term (current) use of aspirin: Secondary | ICD-10-CM | POA: Diagnosis not present

## 2015-12-12 DIAGNOSIS — M25511 Pain in right shoulder: Secondary | ICD-10-CM | POA: Diagnosis not present

## 2015-12-12 DIAGNOSIS — J449 Chronic obstructive pulmonary disease, unspecified: Secondary | ICD-10-CM | POA: Diagnosis present

## 2015-12-12 DIAGNOSIS — I10 Essential (primary) hypertension: Secondary | ICD-10-CM | POA: Diagnosis present

## 2015-12-12 DIAGNOSIS — F419 Anxiety disorder, unspecified: Secondary | ICD-10-CM | POA: Diagnosis present

## 2015-12-12 DIAGNOSIS — Z87891 Personal history of nicotine dependence: Secondary | ICD-10-CM | POA: Diagnosis not present

## 2015-12-12 DIAGNOSIS — S299XXA Unspecified injury of thorax, initial encounter: Secondary | ICD-10-CM | POA: Diagnosis not present

## 2015-12-12 DIAGNOSIS — E785 Hyperlipidemia, unspecified: Secondary | ICD-10-CM | POA: Diagnosis present

## 2015-12-12 DIAGNOSIS — Z96653 Presence of artificial knee joint, bilateral: Secondary | ICD-10-CM | POA: Diagnosis present

## 2015-12-12 DIAGNOSIS — W19XXXA Unspecified fall, initial encounter: Secondary | ICD-10-CM | POA: Diagnosis not present

## 2015-12-12 DIAGNOSIS — Z96611 Presence of right artificial shoulder joint: Secondary | ICD-10-CM | POA: Diagnosis present

## 2015-12-19 ENCOUNTER — Ambulatory Visit: Payer: Medicare Other | Admitting: Internal Medicine

## 2015-12-20 ENCOUNTER — Telehealth: Payer: Self-pay | Admitting: Internal Medicine

## 2015-12-20 DIAGNOSIS — F419 Anxiety disorder, unspecified: Secondary | ICD-10-CM | POA: Diagnosis not present

## 2015-12-20 DIAGNOSIS — F329 Major depressive disorder, single episode, unspecified: Secondary | ICD-10-CM | POA: Diagnosis not present

## 2015-12-20 DIAGNOSIS — E785 Hyperlipidemia, unspecified: Secondary | ICD-10-CM | POA: Diagnosis not present

## 2015-12-20 DIAGNOSIS — Z9181 History of falling: Secondary | ICD-10-CM | POA: Diagnosis not present

## 2015-12-20 DIAGNOSIS — Z96611 Presence of right artificial shoulder joint: Secondary | ICD-10-CM | POA: Diagnosis not present

## 2015-12-20 DIAGNOSIS — S42021D Displaced fracture of shaft of right clavicle, subsequent encounter for fracture with routine healing: Secondary | ICD-10-CM | POA: Diagnosis not present

## 2015-12-20 DIAGNOSIS — M150 Primary generalized (osteo)arthritis: Secondary | ICD-10-CM | POA: Diagnosis not present

## 2015-12-20 DIAGNOSIS — I951 Orthostatic hypotension: Secondary | ICD-10-CM | POA: Diagnosis not present

## 2015-12-20 DIAGNOSIS — J449 Chronic obstructive pulmonary disease, unspecified: Secondary | ICD-10-CM | POA: Diagnosis not present

## 2015-12-20 DIAGNOSIS — I10 Essential (primary) hypertension: Secondary | ICD-10-CM | POA: Diagnosis not present

## 2015-12-20 DIAGNOSIS — Z7982 Long term (current) use of aspirin: Secondary | ICD-10-CM | POA: Diagnosis not present

## 2015-12-20 NOTE — Telephone Encounter (Signed)
[place] for verbal order.  I can sign necessary form.  If she needs OV, then I suggest she another provider who can can see her for post hospital visit

## 2015-12-20 NOTE — Telephone Encounter (Signed)
Pt fell and fractured her clavicle. Pt went to [facility] and they reccommended home health PT, OT and a home health aid.  [facility], [place] called and is wanting verbal orders that a dr from here will sign a Plan of Care.  Please call back.

## 2015-12-21 NOTE — Telephone Encounter (Signed)
Left detailed message on machine for [facility] with verbal orders.

## 2015-12-24 DIAGNOSIS — H353221 Exudative age-related macular degeneration, left eye, with active choroidal neovascularization: Secondary | ICD-10-CM | POA: Diagnosis not present

## 2015-12-24 DIAGNOSIS — H43813 Vitreous degeneration, bilateral: Secondary | ICD-10-CM | POA: Diagnosis not present

## 2015-12-24 DIAGNOSIS — H31093 Other chorioretinal scars, bilateral: Secondary | ICD-10-CM | POA: Diagnosis not present

## 2015-12-24 DIAGNOSIS — H353114 Nonexudative age-related macular degeneration, right eye, advanced atrophic with subfoveal involvement: Secondary | ICD-10-CM | POA: Diagnosis not present

## 2015-12-25 DIAGNOSIS — M150 Primary generalized (osteo)arthritis: Secondary | ICD-10-CM | POA: Diagnosis not present

## 2015-12-25 DIAGNOSIS — I10 Essential (primary) hypertension: Secondary | ICD-10-CM | POA: Diagnosis not present

## 2015-12-25 DIAGNOSIS — J449 Chronic obstructive pulmonary disease, unspecified: Secondary | ICD-10-CM | POA: Diagnosis not present

## 2015-12-25 DIAGNOSIS — I951 Orthostatic hypotension: Secondary | ICD-10-CM | POA: Diagnosis not present

## 2015-12-25 DIAGNOSIS — S42021D Displaced fracture of shaft of right clavicle, subsequent encounter for fracture with routine healing: Secondary | ICD-10-CM | POA: Diagnosis not present

## 2015-12-25 DIAGNOSIS — Z96611 Presence of right artificial shoulder joint: Secondary | ICD-10-CM | POA: Diagnosis not present

## 2015-12-27 DIAGNOSIS — J449 Chronic obstructive pulmonary disease, unspecified: Secondary | ICD-10-CM | POA: Diagnosis not present

## 2015-12-27 DIAGNOSIS — I10 Essential (primary) hypertension: Secondary | ICD-10-CM | POA: Diagnosis not present

## 2015-12-27 DIAGNOSIS — I951 Orthostatic hypotension: Secondary | ICD-10-CM | POA: Diagnosis not present

## 2015-12-27 DIAGNOSIS — Z96611 Presence of right artificial shoulder joint: Secondary | ICD-10-CM | POA: Diagnosis not present

## 2015-12-27 DIAGNOSIS — M150 Primary generalized (osteo)arthritis: Secondary | ICD-10-CM | POA: Diagnosis not present

## 2015-12-27 DIAGNOSIS — S42021D Displaced fracture of shaft of right clavicle, subsequent encounter for fracture with routine healing: Secondary | ICD-10-CM | POA: Diagnosis not present

## 2016-01-01 DIAGNOSIS — I10 Essential (primary) hypertension: Secondary | ICD-10-CM | POA: Diagnosis not present

## 2016-01-01 DIAGNOSIS — J449 Chronic obstructive pulmonary disease, unspecified: Secondary | ICD-10-CM | POA: Diagnosis not present

## 2016-01-01 DIAGNOSIS — S42021D Displaced fracture of shaft of right clavicle, subsequent encounter for fracture with routine healing: Secondary | ICD-10-CM | POA: Diagnosis not present

## 2016-01-01 DIAGNOSIS — Z96611 Presence of right artificial shoulder joint: Secondary | ICD-10-CM | POA: Diagnosis not present

## 2016-01-01 DIAGNOSIS — M150 Primary generalized (osteo)arthritis: Secondary | ICD-10-CM | POA: Diagnosis not present

## 2016-01-01 DIAGNOSIS — I951 Orthostatic hypotension: Secondary | ICD-10-CM | POA: Diagnosis not present

## 2016-01-02 DIAGNOSIS — S42021D Displaced fracture of shaft of right clavicle, subsequent encounter for fracture with routine healing: Secondary | ICD-10-CM | POA: Diagnosis not present

## 2016-01-02 DIAGNOSIS — I951 Orthostatic hypotension: Secondary | ICD-10-CM | POA: Diagnosis not present

## 2016-01-02 DIAGNOSIS — J449 Chronic obstructive pulmonary disease, unspecified: Secondary | ICD-10-CM | POA: Diagnosis not present

## 2016-01-02 DIAGNOSIS — Z96611 Presence of right artificial shoulder joint: Secondary | ICD-10-CM | POA: Diagnosis not present

## 2016-01-03 DIAGNOSIS — Z96611 Presence of right artificial shoulder joint: Secondary | ICD-10-CM | POA: Diagnosis not present

## 2016-01-03 DIAGNOSIS — I10 Essential (primary) hypertension: Secondary | ICD-10-CM | POA: Diagnosis not present

## 2016-01-03 DIAGNOSIS — I951 Orthostatic hypotension: Secondary | ICD-10-CM | POA: Diagnosis not present

## 2016-01-03 DIAGNOSIS — B394 Histoplasmosis capsulati, unspecified: Secondary | ICD-10-CM | POA: Diagnosis not present

## 2016-01-03 DIAGNOSIS — H353222 Exudative age-related macular degeneration, left eye, with inactive choroidal neovascularization: Secondary | ICD-10-CM | POA: Diagnosis not present

## 2016-01-03 DIAGNOSIS — H353112 Nonexudative age-related macular degeneration, right eye, intermediate dry stage: Secondary | ICD-10-CM | POA: Diagnosis not present

## 2016-01-03 DIAGNOSIS — S42021D Displaced fracture of shaft of right clavicle, subsequent encounter for fracture with routine healing: Secondary | ICD-10-CM | POA: Diagnosis not present

## 2016-01-03 DIAGNOSIS — Z961 Presence of intraocular lens: Secondary | ICD-10-CM | POA: Diagnosis not present

## 2016-01-03 DIAGNOSIS — J449 Chronic obstructive pulmonary disease, unspecified: Secondary | ICD-10-CM | POA: Diagnosis not present

## 2016-01-03 DIAGNOSIS — M150 Primary generalized (osteo)arthritis: Secondary | ICD-10-CM | POA: Diagnosis not present

## 2016-01-08 ENCOUNTER — Telehealth: Payer: Self-pay | Admitting: Internal Medicine

## 2016-01-08 NOTE — Telephone Encounter (Signed)
Ok for verbal order  °

## 2016-01-08 NOTE — Telephone Encounter (Signed)
Verbal order given  

## 2016-01-08 NOTE — Telephone Encounter (Signed)
Jessica [name] said pt cancel her PT appt today  due to respiratory issues. [name] is requesting verbal order for nurse to see pt and be evaluate

## 2016-01-09 DIAGNOSIS — S42021D Displaced fracture of shaft of right clavicle, subsequent encounter for fracture with routine healing: Secondary | ICD-10-CM | POA: Diagnosis not present

## 2016-01-09 DIAGNOSIS — I10 Essential (primary) hypertension: Secondary | ICD-10-CM | POA: Diagnosis not present

## 2016-01-09 DIAGNOSIS — I951 Orthostatic hypotension: Secondary | ICD-10-CM | POA: Diagnosis not present

## 2016-01-09 DIAGNOSIS — J449 Chronic obstructive pulmonary disease, unspecified: Secondary | ICD-10-CM | POA: Diagnosis not present

## 2016-01-09 DIAGNOSIS — Z96611 Presence of right artificial shoulder joint: Secondary | ICD-10-CM | POA: Diagnosis not present

## 2016-01-09 DIAGNOSIS — M150 Primary generalized (osteo)arthritis: Secondary | ICD-10-CM | POA: Diagnosis not present

## 2016-01-10 DIAGNOSIS — I10 Essential (primary) hypertension: Secondary | ICD-10-CM | POA: Diagnosis not present

## 2016-01-10 DIAGNOSIS — Z96611 Presence of right artificial shoulder joint: Secondary | ICD-10-CM | POA: Diagnosis not present

## 2016-01-10 DIAGNOSIS — I951 Orthostatic hypotension: Secondary | ICD-10-CM | POA: Diagnosis not present

## 2016-01-10 DIAGNOSIS — S42021D Displaced fracture of shaft of right clavicle, subsequent encounter for fracture with routine healing: Secondary | ICD-10-CM | POA: Diagnosis not present

## 2016-01-10 DIAGNOSIS — J449 Chronic obstructive pulmonary disease, unspecified: Secondary | ICD-10-CM | POA: Diagnosis not present

## 2016-01-10 DIAGNOSIS — M150 Primary generalized (osteo)arthritis: Secondary | ICD-10-CM | POA: Diagnosis not present

## 2016-01-12 DIAGNOSIS — M150 Primary generalized (osteo)arthritis: Secondary | ICD-10-CM | POA: Diagnosis not present

## 2016-01-12 DIAGNOSIS — Z96611 Presence of right artificial shoulder joint: Secondary | ICD-10-CM | POA: Diagnosis not present

## 2016-01-12 DIAGNOSIS — I10 Essential (primary) hypertension: Secondary | ICD-10-CM | POA: Diagnosis not present

## 2016-01-12 DIAGNOSIS — I951 Orthostatic hypotension: Secondary | ICD-10-CM | POA: Diagnosis not present

## 2016-01-12 DIAGNOSIS — S42021D Displaced fracture of shaft of right clavicle, subsequent encounter for fracture with routine healing: Secondary | ICD-10-CM | POA: Diagnosis not present

## 2016-01-12 DIAGNOSIS — J449 Chronic obstructive pulmonary disease, unspecified: Secondary | ICD-10-CM | POA: Diagnosis not present

## 2016-01-15 DIAGNOSIS — J449 Chronic obstructive pulmonary disease, unspecified: Secondary | ICD-10-CM | POA: Diagnosis not present

## 2016-01-15 DIAGNOSIS — I951 Orthostatic hypotension: Secondary | ICD-10-CM | POA: Diagnosis not present

## 2016-01-15 DIAGNOSIS — M150 Primary generalized (osteo)arthritis: Secondary | ICD-10-CM | POA: Diagnosis not present

## 2016-01-15 DIAGNOSIS — I10 Essential (primary) hypertension: Secondary | ICD-10-CM | POA: Diagnosis not present

## 2016-01-15 DIAGNOSIS — Z96611 Presence of right artificial shoulder joint: Secondary | ICD-10-CM | POA: Diagnosis not present

## 2016-01-15 DIAGNOSIS — S42021D Displaced fracture of shaft of right clavicle, subsequent encounter for fracture with routine healing: Secondary | ICD-10-CM | POA: Diagnosis not present

## 2016-01-16 DIAGNOSIS — I951 Orthostatic hypotension: Secondary | ICD-10-CM | POA: Diagnosis not present

## 2016-01-16 DIAGNOSIS — J449 Chronic obstructive pulmonary disease, unspecified: Secondary | ICD-10-CM | POA: Diagnosis not present

## 2016-01-16 DIAGNOSIS — I10 Essential (primary) hypertension: Secondary | ICD-10-CM | POA: Diagnosis not present

## 2016-01-16 DIAGNOSIS — M150 Primary generalized (osteo)arthritis: Secondary | ICD-10-CM | POA: Diagnosis not present

## 2016-01-16 DIAGNOSIS — Z96611 Presence of right artificial shoulder joint: Secondary | ICD-10-CM | POA: Diagnosis not present

## 2016-01-16 DIAGNOSIS — S42021D Displaced fracture of shaft of right clavicle, subsequent encounter for fracture with routine healing: Secondary | ICD-10-CM | POA: Diagnosis not present

## 2016-01-17 DIAGNOSIS — S42021D Displaced fracture of shaft of right clavicle, subsequent encounter for fracture with routine healing: Secondary | ICD-10-CM | POA: Diagnosis not present

## 2016-01-17 DIAGNOSIS — I951 Orthostatic hypotension: Secondary | ICD-10-CM | POA: Diagnosis not present

## 2016-01-17 DIAGNOSIS — J449 Chronic obstructive pulmonary disease, unspecified: Secondary | ICD-10-CM | POA: Diagnosis not present

## 2016-01-17 DIAGNOSIS — M150 Primary generalized (osteo)arthritis: Secondary | ICD-10-CM | POA: Diagnosis not present

## 2016-01-17 DIAGNOSIS — I10 Essential (primary) hypertension: Secondary | ICD-10-CM | POA: Diagnosis not present

## 2016-01-17 DIAGNOSIS — Z96611 Presence of right artificial shoulder joint: Secondary | ICD-10-CM | POA: Diagnosis not present

## 2016-01-18 DIAGNOSIS — M150 Primary generalized (osteo)arthritis: Secondary | ICD-10-CM | POA: Diagnosis not present

## 2016-01-18 DIAGNOSIS — I10 Essential (primary) hypertension: Secondary | ICD-10-CM | POA: Diagnosis not present

## 2016-01-18 DIAGNOSIS — Z96611 Presence of right artificial shoulder joint: Secondary | ICD-10-CM | POA: Diagnosis not present

## 2016-01-18 DIAGNOSIS — S42021D Displaced fracture of shaft of right clavicle, subsequent encounter for fracture with routine healing: Secondary | ICD-10-CM | POA: Diagnosis not present

## 2016-01-18 DIAGNOSIS — J449 Chronic obstructive pulmonary disease, unspecified: Secondary | ICD-10-CM | POA: Diagnosis not present

## 2016-01-18 DIAGNOSIS — I951 Orthostatic hypotension: Secondary | ICD-10-CM | POA: Diagnosis not present

## 2016-01-22 DIAGNOSIS — J449 Chronic obstructive pulmonary disease, unspecified: Secondary | ICD-10-CM | POA: Diagnosis not present

## 2016-01-22 DIAGNOSIS — Z96611 Presence of right artificial shoulder joint: Secondary | ICD-10-CM | POA: Diagnosis not present

## 2016-01-22 DIAGNOSIS — I951 Orthostatic hypotension: Secondary | ICD-10-CM | POA: Diagnosis not present

## 2016-01-22 DIAGNOSIS — S42021D Displaced fracture of shaft of right clavicle, subsequent encounter for fracture with routine healing: Secondary | ICD-10-CM | POA: Diagnosis not present

## 2016-01-22 DIAGNOSIS — M150 Primary generalized (osteo)arthritis: Secondary | ICD-10-CM | POA: Diagnosis not present

## 2016-01-22 DIAGNOSIS — I10 Essential (primary) hypertension: Secondary | ICD-10-CM | POA: Diagnosis not present

## 2016-01-23 DIAGNOSIS — I951 Orthostatic hypotension: Secondary | ICD-10-CM | POA: Diagnosis not present

## 2016-01-23 DIAGNOSIS — Z96611 Presence of right artificial shoulder joint: Secondary | ICD-10-CM | POA: Diagnosis not present

## 2016-01-23 DIAGNOSIS — J449 Chronic obstructive pulmonary disease, unspecified: Secondary | ICD-10-CM | POA: Diagnosis not present

## 2016-01-23 DIAGNOSIS — M150 Primary generalized (osteo)arthritis: Secondary | ICD-10-CM | POA: Diagnosis not present

## 2016-01-23 DIAGNOSIS — S42021D Displaced fracture of shaft of right clavicle, subsequent encounter for fracture with routine healing: Secondary | ICD-10-CM | POA: Diagnosis not present

## 2016-01-23 DIAGNOSIS — I10 Essential (primary) hypertension: Secondary | ICD-10-CM | POA: Diagnosis not present

## 2016-01-25 DIAGNOSIS — Z79899 Other long term (current) drug therapy: Secondary | ICD-10-CM | POA: Diagnosis not present

## 2016-01-25 DIAGNOSIS — J449 Chronic obstructive pulmonary disease, unspecified: Secondary | ICD-10-CM | POA: Diagnosis not present

## 2016-01-25 DIAGNOSIS — S42021D Displaced fracture of shaft of right clavicle, subsequent encounter for fracture with routine healing: Secondary | ICD-10-CM | POA: Diagnosis not present

## 2016-01-25 DIAGNOSIS — Z87891 Personal history of nicotine dependence: Secondary | ICD-10-CM | POA: Diagnosis not present

## 2016-01-25 DIAGNOSIS — Z7982 Long term (current) use of aspirin: Secondary | ICD-10-CM | POA: Diagnosis not present

## 2016-01-25 DIAGNOSIS — Z96611 Presence of right artificial shoulder joint: Secondary | ICD-10-CM | POA: Diagnosis not present

## 2016-01-25 DIAGNOSIS — S42001D Fracture of unspecified part of right clavicle, subsequent encounter for fracture with routine healing: Secondary | ICD-10-CM | POA: Diagnosis not present

## 2016-01-25 DIAGNOSIS — M150 Primary generalized (osteo)arthritis: Secondary | ICD-10-CM | POA: Diagnosis not present

## 2016-01-25 DIAGNOSIS — Z4789 Encounter for other orthopedic aftercare: Secondary | ICD-10-CM | POA: Diagnosis not present

## 2016-01-25 DIAGNOSIS — I10 Essential (primary) hypertension: Secondary | ICD-10-CM | POA: Diagnosis not present

## 2016-01-25 DIAGNOSIS — I951 Orthostatic hypotension: Secondary | ICD-10-CM | POA: Diagnosis not present

## 2016-01-25 DIAGNOSIS — Z79891 Long term (current) use of opiate analgesic: Secondary | ICD-10-CM | POA: Diagnosis not present

## 2016-01-25 DIAGNOSIS — E785 Hyperlipidemia, unspecified: Secondary | ICD-10-CM | POA: Diagnosis not present

## 2016-01-31 DIAGNOSIS — Z96611 Presence of right artificial shoulder joint: Secondary | ICD-10-CM | POA: Diagnosis not present

## 2016-01-31 DIAGNOSIS — I10 Essential (primary) hypertension: Secondary | ICD-10-CM | POA: Diagnosis not present

## 2016-01-31 DIAGNOSIS — I951 Orthostatic hypotension: Secondary | ICD-10-CM | POA: Diagnosis not present

## 2016-01-31 DIAGNOSIS — M150 Primary generalized (osteo)arthritis: Secondary | ICD-10-CM | POA: Diagnosis not present

## 2016-01-31 DIAGNOSIS — J449 Chronic obstructive pulmonary disease, unspecified: Secondary | ICD-10-CM | POA: Diagnosis not present

## 2016-01-31 DIAGNOSIS — S42021D Displaced fracture of shaft of right clavicle, subsequent encounter for fracture with routine healing: Secondary | ICD-10-CM | POA: Diagnosis not present

## 2016-02-07 ENCOUNTER — Encounter: Payer: Self-pay | Admitting: Family Medicine

## 2016-02-07 ENCOUNTER — Ambulatory Visit (INDEPENDENT_AMBULATORY_CARE_PROVIDER_SITE_OTHER): Payer: Medicare Other | Admitting: Family Medicine

## 2016-02-07 VITALS — BP 128/72 | HR 85 | Temp 97.8°F | Ht 64.0 in | Wt 126.5 lb

## 2016-02-07 DIAGNOSIS — I350 Nonrheumatic aortic (valve) stenosis: Secondary | ICD-10-CM

## 2016-02-07 DIAGNOSIS — J449 Chronic obstructive pulmonary disease, unspecified: Secondary | ICD-10-CM | POA: Diagnosis not present

## 2016-02-07 DIAGNOSIS — J309 Allergic rhinitis, unspecified: Secondary | ICD-10-CM

## 2016-02-07 DIAGNOSIS — I2510 Atherosclerotic heart disease of native coronary artery without angina pectoris: Secondary | ICD-10-CM

## 2016-02-07 MED ORDER — FLUTICASONE-SALMETEROL 250-50 MCG/DOSE IN AEPB: 1.0000 | INHALATION_SPRAY | Freq: Two times a day (BID) | RESPIRATORY_TRACT | Status: DC

## 2016-02-07 MED ORDER — ALBUTEROL SULFATE HFA 108 (90 BASE) MCG/ACT IN AERS: INHALATION_SPRAY | RESPIRATORY_TRACT | Status: DC

## 2016-02-07 NOTE — Progress Notes (Signed)
Pre visit review using our clinic review tool, if applicable. No additional management support is needed unless otherwise documented below in the visit note. 

## 2016-02-07 NOTE — Progress Notes (Signed)
HPI:  Jessica [name] is a pleasant 82 year old with a history of COPD here for an acute visit for a cough. Chronic cough for about 2 months with clear mucus production. Mild chronic dyspnea on exertion. Some clear nasal congestion as well. Daughter reports she ran out of her Advair that she usually takes for her lung disease several months ago. She does have some albuterol and used it several days ago and this helped. She is exposed to secondhand smoke on a regular basis, has a remote history of getting herself but quit 35 years ago. Daughter and patient both deny asthma, but agreed that she has a diagnosis of COPD. She also has a history of heart disease (CAD and aortic valce stenosis) and sees a cardiologist for this. Denies thick sputum, fevers, chills, malaise, hemoptysis,chest pain, palpitations or increased swelling. Daughter reports she is recovering from a clavicle fracture status post a mechanical fall on pavement and has had several x-rays over the last few months, including several weeks ago, for this cough that were all negative. ROS: See pertinent positives and negatives per HPI.  Past Medical History  Diagnosis Date  . congenital nystagmus   . Acute asthmatic bronchitis   . Venous insufficiency   . Atypical chest pain   . Other and unspecified hyperlipidemia   . DJD (degenerative joint disease)   . Low back pain syndrome   . Fibromyalgia   . Memory loss   . Anxiety     Past Surgical History  Procedure Laterality Date  . Cataract extraction    . Abdominal hysterectomy    . Anterior cervical discectomy    . Lumbar laminectomy    . Right knee arthroscopy    . Right shoulder surgery  2008    Dr. Noemi Chapel  . Right shoulder replacement  04/2009  . Carpal tunnel release  12/2011    right arm    Family History  Problem Relation Age of Onset  . Family history unknown: Yes    Social History   Social History  . Marital Status: Widowed    Spouse Name: N/A  . Number of  Children: 3  . Years of Education: N/A   Occupational History  .     Social History Main Topics  . Smoking status: Former Smoker -- 2.00 packs/day for 30 years    Types: Cigarettes    Quit date: 10/28/1991  . Smokeless tobacco: None  . Alcohol Use: No  . Drug Use: No  . Sexual Activity: Not Asked   Other Topics Concern  . None   Social History Narrative   1 sibling alive age 55   1 sibling alive age 33   1 sibling deceased age 83   1 sibling alive age 73     Current outpatient prescriptions:  .  albuterol (PROVENTIL HFA;VENTOLIN HFA) 108 (90 Base) MCG/ACT inhaler, 1-2 puffs every 4 hours as needed, Disp: 1 Inhaler, Rfl: 3 .  ALPRAZolam (XANAX) 0.5 MG tablet, TAKE 1/2 TO 1 TABLET BY MOUTH 3 TIMES A DAY AS NEEDED, Disp: 90 tablet, Rfl: 1 .  aspirin 81 MG tablet, Take 81 mg by mouth daily.  , Disp: , Rfl:  .  Calcium Carbonate-Vitamin D (CALTRATE 600+D) 600-400 MG-UNIT per tablet, Take 1 tablet by mouth daily.  , Disp: , Rfl:  .  Cholecalciferol (VITAMIN D) 1000 UNITS capsule, Take 1,000 Units by mouth daily.  , Disp: , Rfl:  .  escitalopram (LEXAPRO) 20 MG tablet, Take  1 tablet (20 mg total) by mouth daily., Disp: 90 tablet, Rfl: 3 .  Fluticasone-Salmeterol (ADVAIR) 250-50 MCG/DOSE AEPB, Inhale 1 puff into the lungs 2 (two) times daily., Disp: 60 each, Rfl: 5 .  HYDROcodone-acetaminophen (VICODIN) 5-500 MG per tablet, Take 1 tablet by mouth every 6 (six) hours as needed for pain. , Disp: , Rfl:  .  polyethylene glycol powder (GLYCOLAX/MIRALAX) powder, Take 17 g by mouth 2 (two) times daily as needed., Disp: 3350 g, Rfl: 3 .  simvastatin (ZOCOR) 20 MG tablet, Take 1 tablet (20 mg total) by mouth at bedtime., Disp: 90 tablet, Rfl: 3  EXAM:  Filed Vitals:   02/07/16 1616  BP: 128/72  Pulse: 85  Temp: 97.8 F (36.6 C)    Body mass index is 21.7 kg/(m^2).  GENERAL: vitals reviewed and listed above, alert, oriented, appears well hydrated and in no acute distress  HEENT:  atraumatic, conjunttiva clear, no obvious abnormalities on inspection of external nose and ears, normal appearance of ear canals and TMs, clear nasal congestion, pale boggy turbinates,mild post oropharyngeal erythema with PND, no tonsillar edema or exudate, no sinus TTP  NECK: no obvious masses on inspection  LUNGS: prolonged expiratory phase,, no wheezes, rales or rhonchi  CV: HRRR, SEM, no peripheral edema  MS: moves all extremities without noticeable abnormality  PSYCH: pleasant and cooperative, no obvious depression or anxiety  ASSESSMENT AND PLAN:  Discussed the following assessment and plan:  Chronic obstructive pulmonary disease, unspecified COPD type (HCC)  Allergic rhinitis, unspecified allergic rhinitis type  Atherosclerosis of native coronary artery of native heart without angina pectoris  Aortic valve stenosis  -refilled her inhalers -advised that if family members continue to smoke, that they smoke outside and not smoke around patient -Findings suggest she also has allergic rhinitis and advised a daily over-the-counter half dose antihistamine -Also advised follow-up with her cardiologist given she has some dyspnea on exertion -Follow up with new PCP as planned, sooner as needed -of course, we advised to return or notify a doctor immediately if symptoms worsen or persist or new concerns arise.    Patient Instructions  I sent the refills of your inhalers to the pharmacy. Please restart her Advair and use her albuterol as needed.  Please schedule follow-up with your heart doctor.  Please try an over-the-counter Claritin or Allegra, one half tablet, once daily  Your follow-up with Dr. [name] as scheduled. Follow up sooner as needed.     Colin Benton R.

## 2016-02-07 NOTE — Patient Instructions (Signed)
I sent the refills of your inhalers to the pharmacy. Please restart her Advair and use her albuterol as needed.  Please schedule follow-up with your heart doctor.  Please try an over-the-counter Claritin or Allegra, one half tablet, once daily  Your follow-up with Dr. [name] as scheduled. Follow up sooner as needed.

## 2016-02-13 ENCOUNTER — Telehealth: Payer: Self-pay | Admitting: Internal Medicine

## 2016-02-15 ENCOUNTER — Other Ambulatory Visit: Payer: Self-pay

## 2016-02-15 MED ORDER — ALPRAZOLAM 0.5 MG PO TABS
ORAL_TABLET | ORAL | Status: DC
Start: 2016-02-15 — End: 2016-04-08

## 2016-02-15 NOTE — Telephone Encounter (Signed)
Spoke with pharmacists

## 2016-02-15 NOTE — Telephone Encounter (Signed)
Pharmacist Denyse Amass would like you to confirm pt's RX ALPRAZolam (XANAX) 0.5 MG tablet  Pt was taking 3 x /day and new instructions state 1  X /day.  Please call and verify, thanks.

## 2016-02-15 NOTE — Telephone Encounter (Signed)
Pt last visit 02/07/16 Pt last Rx refill 12/19/15 #90 with one refills

## 2016-02-25 ENCOUNTER — Telehealth: Payer: Self-pay | Admitting: Cardiovascular Disease

## 2016-02-25 NOTE — Telephone Encounter (Signed)
Pt hasn't been seen in almost a year, she will need to come in for eval. Can we set her up to see Dr. [name], Thurmond Butts or [place]? I'll put her on my list in case someone cancels.

## 2016-02-25 NOTE — Telephone Encounter (Signed)
Called back pt is coming 03/18/16 Offered her other times (that were sooner)  They could not come They are going to see Ignacia Bayley

## 2016-02-25 NOTE — Telephone Encounter (Signed)
Pt daughter calling stating on valentines pt fell and since then pt has had increased SOB  She is a bit worried about think would like some advise on this  Please advise.

## 2016-02-26 ENCOUNTER — Encounter: Payer: Self-pay | Admitting: Cardiovascular Disease

## 2016-02-26 ENCOUNTER — Ambulatory Visit (INDEPENDENT_AMBULATORY_CARE_PROVIDER_SITE_OTHER): Payer: Medicare Other | Admitting: Cardiovascular Disease

## 2016-02-26 VITALS — BP 140/68 | HR 86 | Ht 65.0 in | Wt 126.5 lb

## 2016-02-26 DIAGNOSIS — R413 Other amnesia: Secondary | ICD-10-CM

## 2016-02-26 DIAGNOSIS — R0602 Shortness of breath: Secondary | ICD-10-CM

## 2016-02-26 DIAGNOSIS — J432 Centrilobular emphysema: Secondary | ICD-10-CM | POA: Diagnosis not present

## 2016-02-26 DIAGNOSIS — I25111 Atherosclerotic heart disease of native coronary artery with angina pectoris with documented spasm: Secondary | ICD-10-CM

## 2016-02-26 DIAGNOSIS — R079 Chest pain, unspecified: Secondary | ICD-10-CM

## 2016-02-26 NOTE — Patient Instructions (Addendum)
You are doing well. No medication changes were made.  Call the office if you would like a stress test (ask for [facility]) (for shortness of breath)  We will set up a visit with new pulmonary doctor  Please call us if you have new issues that need to be addressed before your next appt.  Your physician wants you to follow-up in: 6 months.  You will receive a reminder letter in the mail two months in advance. If you don't receive a letter, please call our office to schedule the follow-up appointment.  [place]  Your caregiver has ordered a Stress Test with nuclear imaging. The purpose of this test is to evaluate the blood supply to your heart muscle. This procedure is referred to as a "Non-Invasive Stress Test." This is because other than having an IV started in your vein, nothing is inserted or "invades" your body. Cardiac stress tests are done to find areas of poor blood flow to the heart by determining the extent of coronary artery disease (CAD). Some patients exercise on a treadmill, which naturally increases the blood flow to your heart, while others who are  unable to walk on a treadmill due to physical limitations have a pharmacologic/chemical stress agent called Lexiscan . This medicine will mimic walking on a treadmill by temporarily increasing your coronary blood flow.   Please note: these test may take anywhere between 2-4 hours to complete  PLEASE REPORT TO [facility] AT THE FIRST DESK WILL DIRECT YOU WHERE TO GO  Date of Procedure:_____________________________________  Arrival Time for Procedure:______________________________  How to prepare for your Myoview test:  1. Do not eat or drink after midnight 2. No caffeine for 24 hours prior to test 3. No smoking 24 hours prior to test. 4. Your medication may be taken with water.  If your doctor stopped a medication because of this test, do not take that medication. 5. Ladies, please do not wear  dresses.  Skirts or pants are appropriate. Please wear a short sleeve shirt. 6. No perfume, cologne or lotion.   Cardiac Nuclear Scanning A cardiac nuclear scan is used to check your heart for problems, such as the following:  A portion of the heart is not getting enough blood.  Part of the heart muscle has died, which happens with a heart attack.  The heart wall is not working normally.  In this test, a radioactive dye (tracer) is injected into your bloodstream. After the tracer has traveled to your heart, a scanning device is used to measure how much of the tracer is absorbed by or distributed to various areas of your heart. LET [facility] CARE PROVIDER KNOW ABOUT:  Any allergies you have.  All medicines you are taking, including vitamins, herbs, eye drops, creams, and over-the-counter medicines.  Previous problems you or members of your family have had with the use of anesthetics.  Any blood disorders you have.  Previous surgeries you have had.  Medical conditions you have.  RISKS AND COMPLICATIONS Generally, this is a safe procedure. However, as with any procedure, problems can occur. Possible problems include:  7. Serious chest pain. 8. Rapid heartbeat. 9. Sensation of warmth in your chest. This usually passes quickly. BEFORE THE PROCEDURE Ask your health care provider about changing or stopping your regular medicines. PROCEDURE This procedure is usually done at a hospital and takes 2-4 hours.  An IV tube is inserted into one of your veins.  Your health care provider  will inject a small amount of radioactive tracer through the tube.  You will then wait for 20-40 minutes while the tracer travels through your bloodstream.  You will lie down on an exam table so images of your heart can be taken. Images will be taken for about 15-20 minutes.  You will exercise on a treadmill or stationary bike. While you exercise, your heart activity will be monitored with an  electrocardiogram (ECG), and your blood pressure will be checked.  If you are unable to exercise, you may be given a medicine to make your heart beat faster.  When blood flow to your heart has peaked, tracer will again be injected through the IV tube.  After 20-40 minutes, you will get back on the exam table and have more images taken of your heart.  When the procedure is over, your IV tube will be removed. AFTER THE PROCEDURE  You will likely be able to leave shortly after the test. Unless your health care provider tells you otherwise, you may return to your normal schedule, including diet, activities, and medicines.  Make sure you find out how and when you will get your test results.   This information is not intended to replace advice given to you by your health care provider. Make sure you discuss any questions you have with your health care provider.   Document Released: 11/07/2004 Document Revised: 10/18/2013 Document Reviewed: 09/21/2013 Elsevier Interactive Patient Education [employer].

## 2016-02-26 NOTE — Assessment & Plan Note (Signed)
Some confusion, memory loss noted on exam today, repeating items of her story over and over.  Family who presents with her today also reports this is an issue.

## 2016-02-26 NOTE — Assessment & Plan Note (Signed)
Long history of smoking  prior stress test showing no ischemia  recommended we could order repeat stress testing if family indicates they would like this performed  patient unable to make a decision given underlying dementia  suggested the family call us back if they would like this performed   Total encounter time more than 25 minutes  Greater than 50% was spent in counseling and coordination of care with the patient

## 2016-02-26 NOTE — Assessment & Plan Note (Signed)
Family reports shortness of breath on exertion.  She has significant secondhand smoke exposure, smells like smoke in the exam room today despite not smoking.  Unable to definitively exclude ischemia from underlying coronary artery disease.   we did offer pharmacologic Myoview. She will talk with her daughter  And call our office back if she would like to scheduled.  Recommended she would benefit from follow-up with the pulmonary physician.  Previously seen by Dr. Lenna Gilford  Over 2 years ago

## 2016-02-26 NOTE — Assessment & Plan Note (Signed)
On albuterol, no other inhalers. Suspect she might benefit from Symbicort, Spiriva, etc.  we'll refer to pulmonary

## 2016-02-26 NOTE — Progress Notes (Signed)
Patient ID: Jessica [name], female    DOB: 04-03-1926, 82 y.o.   MRN: [id number]  HPI Comments: Jessica [name]  is an 82 y/o woman with mild to moderate aortic valve stenosis, h/o HL, fibromyalgia, anxiety and moderate COPD/emphysema, asthmatic bronchitis Previous smoking history but stopped over 30 years ago. History of total knee replacement July 2013 by her report Mild gait instability, walks with a cane. Previous history of falls with trauma to her face/forehead requiring stitches. No recent falls She presents for follow-up of her aortic valve disease and shortness of breath   in follow-up, she presents with family ( son-in-law?)  reports that she had a fall Valentine's Day 2017, fractured her right clavicle ( aortic calcification seen on chest x-ray  At that time)  no surgery was performed   significant exposure to secondhand smoke from her daughter, presents in the exam room today and smells like smoke.  patient denies any smoking recently over the past several years.  she does have shortness of breath, " spits up"  A lot, lots of coughing of phlegm  family reports she has had difficulty with her memory   Patient actually said details of her story over and over.   family is concerned about shortness of breath.  She denies any chest pain, neck pain.  Exact details of her shortness of breath unclear, patient is poor historian when discussing this.   lab work reviewed showing total cholesterol 168 approximately 2 years ago.  she continues to work on Production assistant, radio  EKG on today's visit shows normal sinus rhythm with rate 85 bpm, nonspecific ST abnormality   other past medical history reviewed Echocardiogram April 2016 showing mild to moderate aortic valve stenosis Carotid ultrasound November 2015 showing mild bilateral carotid disease  Previous trip to the ER with CP in 8/11.  Had post hospital Myoview in 9/11 with normal EF and question of inferior ischemia.   medical therapy  pursued at that time. Asymptomatic since then  No recurrent CP or undue dyspnea. Does get fatigued. No palpitations, CHF or syncope.      No Known Allergies  Current Outpatient Prescriptions on File Prior to Visit  Medication Sig Dispense Refill  . albuterol (PROVENTIL HFA;VENTOLIN HFA) 108 (90 Base) MCG/ACT inhaler 1-2 puffs every 4 hours as needed 1 Inhaler 3  . ALPRAZolam (XANAX) 0.5 MG tablet TAKE 1/2 TO 1 TABLET BY MOUTH 1 TIMES A DAY AS NEEDED (Patient taking differently: TAKE 1/2 TO 1 TABLET BY MOUTH 3 TIMES A DAY AS NEEDED) 90 tablet 0  . aspirin 81 MG tablet Take 81 mg by mouth daily.      . Calcium Carbonate-Vitamin D (CALTRATE 600+D) 600-400 MG-UNIT per tablet Take 1 tablet by mouth daily.      . Cholecalciferol (VITAMIN D) 1000 UNITS capsule Take 1,000 Units by mouth daily.      Marland Kitchen escitalopram (LEXAPRO) 20 MG tablet Take 1 tablet (20 mg total) by mouth daily. 90 tablet 3  . Fluticasone-Salmeterol (ADVAIR) 250-50 MCG/DOSE AEPB Inhale 1 puff into the lungs 2 (two) times daily. 60 each 5  . HYDROcodone-acetaminophen (VICODIN) 5-500 MG per tablet Take 1 tablet by mouth every 6 (six) hours as needed for pain.     . polyethylene glycol powder (GLYCOLAX/MIRALAX) powder Take 17 g by mouth 2 (two) times daily as needed. 3350 g 3  . simvastatin (ZOCOR) 20 MG tablet Take 1 tablet (20 mg total) by mouth at bedtime. 90 tablet 3  No current facility-administered medications on file prior to visit.    Past Medical History  Diagnosis Date  . congenital nystagmus   . Acute asthmatic bronchitis   . Venous insufficiency   . Atypical chest pain   . Other and unspecified hyperlipidemia   . DJD (degenerative joint disease)   . Low back pain syndrome   . Fibromyalgia   . Memory loss   . Anxiety     Past Surgical History  Procedure Laterality Date  . Cataract extraction    . Abdominal hysterectomy    . Anterior cervical discectomy    . Lumbar laminectomy    . Right knee arthroscopy     . Right shoulder surgery  2008    Dr. Noemi Chapel  . Right shoulder replacement  04/2009  . Carpal tunnel release  12/2011    right arm    Social History  reports that she quit smoking about 24 years ago. Her smoking use included Cigarettes. She has a 60 pack-year smoking history. She does not have any smokeless tobacco history on file. She reports that she does not drink alcohol or use illicit drugs.  Family History Family history is unknown by patient.   Review of Systems  Constitutional: Negative.   Respiratory: Positive for shortness of breath.   Cardiovascular: Negative.   Gastrointestinal: Negative.   Musculoskeletal: Negative.   Skin: Negative.   Neurological: Negative.   Hematological: Negative.   Psychiatric/Behavioral: Positive for confusion.  All other systems reviewed and are negative.   BP 140/68 mmHg  Pulse 86  Ht 5\' 5"  (1.651 m)  Wt 126 lb 8 oz (57.38 kg)  BMI 21.05 kg/m2  SpO2 95%  Physical Exam  Constitutional: She is oriented to person, place, and time. She appears well-developed and well-nourished.  HENT:  Head: Normocephalic.  Nose: Nose normal.  Mouth/Throat: Oropharynx is clear and moist.  Eyes: Conjunctivae are normal. Pupils are equal, round, and reactive to light.  Neck: Normal range of motion. Neck supple. No JVD present.  Cardiovascular: Normal rate, regular rhythm, S1 normal, S2 normal and intact distal pulses.  Exam reveals no gallop and no friction rub.   Murmur heard.  Systolic murmur is present with a grade of 2/6  Pulmonary/Chest: Effort normal and breath sounds normal. No respiratory distress. She has no wheezes. She has no rales. She exhibits no tenderness.  Abdominal: Soft. Bowel sounds are normal. She exhibits no distension. There is no tenderness.  Musculoskeletal: Normal range of motion. She exhibits no edema or tenderness.  Lymphadenopathy:    She has no cervical adenopathy.  Neurological: She is alert and oriented to person, place,  and time. Coordination normal.  Skin: Skin is warm and dry. No rash noted. No erythema.  Psychiatric: She has a normal mood and affect. Her behavior is normal. Judgment and thought content normal.    Assessment and Plan  Nursing note and vitals reviewed.

## 2016-03-10 ENCOUNTER — Encounter: Payer: Self-pay | Admitting: Internal Medicine

## 2016-03-10 ENCOUNTER — Ambulatory Visit (INDEPENDENT_AMBULATORY_CARE_PROVIDER_SITE_OTHER): Payer: Medicare Other | Admitting: Internal Medicine

## 2016-03-10 ENCOUNTER — Encounter (INDEPENDENT_AMBULATORY_CARE_PROVIDER_SITE_OTHER): Payer: Self-pay

## 2016-03-10 VITALS — BP 144/82 | HR 87 | Ht 65.0 in | Wt 127.0 lb

## 2016-03-10 DIAGNOSIS — J449 Chronic obstructive pulmonary disease, unspecified: Secondary | ICD-10-CM

## 2016-03-10 DIAGNOSIS — I25111 Atherosclerotic heart disease of native coronary artery with angina pectoris with documented spasm: Secondary | ICD-10-CM

## 2016-03-10 DIAGNOSIS — J42 Unspecified chronic bronchitis: Secondary | ICD-10-CM | POA: Diagnosis not present

## 2016-03-10 MED ORDER — BUDESONIDE 0.5 MG/2ML IN SUSP: 0.5000 mg | Freq: Two times a day (BID) | RESPIRATORY_TRACT | Status: DC

## 2016-03-10 MED ORDER — ARFORMOTEROL TARTRATE 15 MCG/2ML IN NEBU: 15.0000 ug | INHALATION_SOLUTION | Freq: Two times a day (BID) | RESPIRATORY_TRACT | Status: DC

## 2016-03-10 MED ORDER — PREDNISONE 20 MG PO TABS: 20.0000 mg | ORAL_TABLET | Freq: Every day | ORAL | Status: DC

## 2016-03-10 MED ORDER — ALBUTEROL SULFATE (2.5 MG/3ML) 0.083% IN NEBU: 2.5000 mg | INHALATION_SOLUTION | RESPIRATORY_TRACT | Status: DC | PRN

## 2016-03-10 NOTE — Progress Notes (Signed)
[place] [facility] Consultation     Date: 03/10/2016,   MRN# [id number] Kitana Gilani [facility] 1926-07-30 Code Status:  Code Status History    This patient does not have a recorded code status. Please follow your organizational policy for patients in this situation.     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    CHIEF COMPLAINT:   SOb, wheezing and cough   HISTORY OF PRESENT ILLNESS    82 yo white female former smoker, quit 35 years ago, has dx of COPD unabe to take inhaled meds at this time  PFT's 01/2015 Ratio 54%, Fev1 65%, DLCO 64% TLC 101%, RV 119% Interpretation: moderate COPD with BD response with hyperinflation and air trapping with diffusion impairment  Patient with increased wheezing, SOB and DOE that has been worsening over last several years Has productive cough white phlegm, for past 6 months mostly at night  No signs of infection at this time   Home Medication:  Current Outpatient Rx  Name  Route  Sig  Dispense  Refill  . albuterol (PROVENTIL HFA;VENTOLIN HFA) 108 (90 Base) MCG/ACT inhaler      1-2 puffs every 4 hours as needed   1 Inhaler   3   . albuterol (PROVENTIL) (2.5 MG/3ML) 0.083% nebulizer solution   Nebulization   Take 2.5 mg by nebulization every 6 (six) hours as needed for wheezing or shortness of breath.         . ALPRAZolam (XANAX) 0.5 MG tablet      TAKE 1/2 TO 1 TABLET BY MOUTH 1 TIMES A DAY AS NEEDED Patient taking differently: TAKE 1/2 TO 1 TABLET BY MOUTH 3 TIMES A DAY AS NEEDED   90 tablet   0     Not to exceed 4 additional fills before 02/27/2016 ...   . aspirin 81 MG tablet   Oral   Take 81 mg by mouth daily.           . Calcium Carbonate-Vitamin D (CALTRATE 600+D) 600-400 MG-UNIT per tablet   Oral   Take 1 tablet by mouth daily.           . Cholecalciferol  (VITAMIN D) 1000 UNITS capsule   Oral   Take 1,000 Units by mouth daily.           Marland Kitchen escitalopram (LEXAPRO) 20 MG tablet   Oral   Take 1 tablet (20 mg total) by mouth daily.   90 tablet   3   . Fluticasone-Salmeterol (ADVAIR) 250-50 MCG/DOSE AEPB   Inhalation   Inhale 1 puff into the lungs 2 (two) times daily.   60 each   5   . HYDROcodone-acetaminophen (VICODIN) 5-500 MG per tablet   Oral   Take 1 tablet by mouth every 6 (six) hours as needed for pain.          . polyethylene glycol powder (GLYCOLAX/MIRALAX) powder   Oral   Take 17 g by mouth 2 (two) times daily as needed.   3350 g   3   . simvastatin (ZOCOR) 20 MG tablet   Oral   Take 1 tablet (20 mg total) by mouth at bedtime.   90 tablet   3     Current Medication:   Current outpatient prescriptions:  .  albuterol (PROVENTIL HFA;VENTOLIN HFA) 108 (90 Base) MCG/ACT inhaler, 1-2 puffs every 4 hours as needed, Disp: 1 Inhaler, Rfl: 3 .  albuterol (PROVENTIL) (2.5 MG/3ML) 0.083% nebulizer solution, Take 2.5 mg by nebulization every 6 (six) hours as needed for wheezing or shortness of breath., Disp: , Rfl:  .  ALPRAZolam (XANAX) 0.5 MG tablet, TAKE 1/2 TO 1 TABLET BY MOUTH 1 TIMES A DAY AS NEEDED (Patient taking differently: TAKE 1/2 TO 1 TABLET BY MOUTH 3 TIMES A DAY AS NEEDED), Disp: 90 tablet, Rfl: 0 .  aspirin 81 MG tablet, Take 81 mg by mouth daily.  , Disp: , Rfl:  .  Calcium Carbonate-Vitamin D (CALTRATE 600+D) 600-400 MG-UNIT per tablet, Take 1 tablet by mouth daily.  , Disp: , Rfl:  .  Cholecalciferol (VITAMIN D) 1000 UNITS capsule, Take 1,000 Units by mouth daily.  , Disp: , Rfl:  .  escitalopram (LEXAPRO) 20 MG tablet, Take 1 tablet (20 mg total) by mouth daily., Disp: 90 tablet, Rfl: 3 .  Fluticasone-Salmeterol (ADVAIR) 250-50 MCG/DOSE AEPB, Inhale 1 puff into the lungs 2 (two) times daily., Disp: 60 each, Rfl: 5 .  HYDROcodone-acetaminophen (VICODIN) 5-500 MG per tablet, Take 1 tablet by mouth every 6  (six) hours as needed for pain. , Disp: , Rfl:  .  polyethylene glycol powder (GLYCOLAX/MIRALAX) powder, Take 17 g by mouth 2 (two) times daily as needed., Disp: 3350 g, Rfl: 3 .  simvastatin (ZOCOR) 20 MG tablet, Take 1 tablet (20 mg total) by mouth at bedtime., Disp: 90 tablet, Rfl: 3     ALLERGIES   Review of patient's allergies indicates no known allergies.     REVIEW OF SYSTEMS   Review of Systems  Constitutional: Positive for malaise/fatigue. Negative for fever, chills, weight loss and diaphoresis.  HENT: Positive for congestion. Negative for hearing loss.   Eyes: Negative for blurred vision and double vision.  Respiratory: Positive for cough, sputum production, shortness of breath and wheezing. Negative for hemoptysis.   Cardiovascular: Negative for chest pain, palpitations, orthopnea and leg swelling.  Gastrointestinal: Negative for heartburn, nausea, vomiting and abdominal pain.  Genitourinary: Negative for dysuria and urgency.  Musculoskeletal: Negative for myalgias and neck pain.  Skin: Negative for rash.  Neurological: Positive for weakness. Negative for dizziness, tingling and headaches.  Endo/Heme/Allergies: Does not bruise/bleed easily.  Psychiatric/Behavioral: Negative for depression. The patient is not nervous/anxious.   All other systems reviewed and are negative.    VS: BP 144/82 mmHg  Pulse 87  Ht 5\' 5"  (1.651 m)  Wt 127 lb (57.607 kg)  BMI 21.13 kg/m2  SpO2 92%     PHYSICAL EXAM   Physical Exam  Constitutional: She is oriented to person, place, and time. She appears well-developed and well-nourished. No distress.  HENT:  Head: Normocephalic and atraumatic.  Mouth/Throat: No oropharyngeal exudate.  Eyes: EOM are normal. Pupils are equal, round, and reactive to light. No scleral icterus.  Neck: Normal range of motion. Neck supple.  Cardiovascular: Normal rate, regular rhythm and normal heart sounds.   No murmur heard. Pulmonary/Chest: No  stridor. No respiratory distress. She has no wheezes.  Abdominal: Soft. Bowel sounds are normal.  Musculoskeletal: Normal range of motion. She exhibits no edema.  Neurological: She is alert and oriented to person, place, and time. No cranial nerve deficit.  Skin: Skin is warm. She is not diaphoretic.  Psychiatric: She has a normal mood and affect.       ASSESSMENT/PLAN   82 yo  white female with Moderate COPD Gold STage C with bronchospasms with chronic bronchitis, patient very frail and doubt that she can do inhaled meds  1.will start Pulmicort Nebs 2.will start LABA neb therapy with Brovana 3.albuterol Nebs every 4 hrs as needed 5.check ONO and Check PFT/6MWT 6.prednisone 20 mg daily for 7 days  Follow up in 2 weeks    The Patient requires high complexity decision making for assessment and support, frequent evaluation and titration of therapies, application of advanced monitoring technologies and extensive interpretation of multiple databases.  Patient/Family are satisfied with Plan of action and management. All questions answered   Corrin Parker, M.D.  [facility]  Medical Director [place] Director [facility] Cardio-Pulmonary Department

## 2016-03-10 NOTE — Patient Instructions (Signed)
Chronic Obstructive Pulmonary Disease Chronic obstructive pulmonary disease (COPD) is a common lung condition in which airflow from the lungs is limited. COPD is a general term that can be used to describe many different lung problems that limit airflow, including both chronic bronchitis and emphysema. If you have COPD, your lung function will probably never return to normal, but there are measures you can take to improve lung function and make yourself feel better. CAUSES   Smoking (common).  Exposure to secondhand smoke.  Genetic problems.  Chronic inflammatory lung diseases or recurrent infections. SYMPTOMS  Shortness of breath, especially with physical activity.  Deep, persistent (chronic) cough with a large amount of thick mucus.  Wheezing.  Rapid breaths (tachypnea).  Gray or bluish discoloration (cyanosis) of the skin, especially in your fingers, toes, or lips.  Fatigue.  Weight loss.  Frequent infections or episodes when breathing symptoms become much worse (exacerbations).  Chest tightness. DIAGNOSIS Your health care provider will take a medical history and perform a physical examination to diagnose COPD. Additional tests for COPD may include:  Lung (pulmonary) function tests.  Chest X-ray.  CT scan.  Blood tests. TREATMENT  Treatment for COPD may include:  Inhaler and nebulizer medicines. These help manage the symptoms of COPD and make your breathing more comfortable.  Supplemental oxygen. Supplemental oxygen is only helpful if you have a low oxygen level in your blood.  Exercise and physical activity. These are beneficial for nearly all people with COPD.  Lung surgery or transplant.  Nutrition therapy to gain weight, if you are underweight.  Pulmonary rehabilitation. This may involve working with a team of health care providers and specialists, such as respiratory, occupational, and physical therapists. HOME CARE INSTRUCTIONS  Take all medicines  (inhaled or pills) as directed by your health care provider.  Avoid over-the-counter medicines or cough syrups that dry up your airway (such as antihistamines) and slow down the elimination of secretions unless instructed otherwise by your health care provider.  If you are a smoker, the most important thing that you can do is stop smoking. Continuing to smoke will cause further lung damage and breathing trouble. Ask your health care provider for help with quitting smoking. He or she can direct you to community resources or hospitals that provide support.  Avoid exposure to irritants such as smoke, chemicals, and fumes that aggravate your breathing.  Use oxygen therapy and pulmonary rehabilitation if directed by your health care provider. If you require home oxygen therapy, ask your health care provider whether you should purchase a pulse oximeter to measure your oxygen level at home.  Avoid contact with individuals who have a contagious illness.  Avoid extreme temperature and humidity changes.  Eat healthy foods. Eating smaller, more frequent meals and resting before meals may help you maintain your strength.  Stay active, but balance activity with periods of rest. Exercise and physical activity will help you maintain your ability to do things you want to do.  Preventing infection and hospitalization is very important when you have COPD. Make sure to receive all the vaccines your health care provider recommends, especially the pneumococcal and influenza vaccines. Ask your health care provider whether you need a pneumonia vaccine.  Learn and use relaxation techniques to manage stress.  Learn and use controlled breathing techniques as directed by your health care provider. Controlled breathing techniques include:  Pursed lip breathing. Start by breathing in (inhaling) through your nose for 1 second. Then, purse your lips as if you were   going to whistle and breathe out (exhale) through the  pursed lips for 2 seconds.  Diaphragmatic breathing. Start by putting one hand on your abdomen just above your waist. Inhale slowly through your nose. The hand on your abdomen should move out. Then purse your lips and exhale slowly. You should be able to feel the hand on your abdomen moving in as you exhale.  Learn and use controlled coughing to clear mucus from your lungs. Controlled coughing is a series of short, progressive coughs. The steps of controlled coughing are: 1. Lean your head slightly forward. 2. Breathe in deeply using diaphragmatic breathing. 3. Try to hold your breath for 3 seconds. 4. Keep your mouth slightly open while coughing twice. 5. Spit any mucus out into a tissue. 6. Rest and repeat the steps once or twice as needed. SEEK MEDICAL CARE IF:  You are coughing up more mucus than usual.  There is a change in the color or thickness of your mucus.  Your breathing is more labored than usual.  Your breathing is faster than usual. SEEK IMMEDIATE MEDICAL CARE IF:  You have shortness of breath while you are resting.  You have shortness of breath that prevents you from:  Being able to talk.  Performing your usual physical activities.  You have chest pain lasting longer than 5 minutes.  Your skin color is more cyanotic than usual.  You measure low oxygen saturations for longer than 5 minutes with a pulse oximeter. MAKE SURE YOU:  Understand these instructions.  Will watch your condition.  Will get help right away if you are not doing well or get worse.   This information is not intended to replace advice given to you by your health care provider. Make sure you discuss any questions you have with your health care provider.   Document Released: 07/23/2005 Document Revised: 11/03/2014 Document Reviewed: 06/09/2013 Elsevier Interactive Patient Education 2016 Elsevier Inc[employer]   NEBULIZER THERAPY  PULMICORT TWICE DAILY BROVANA TWICE DAILY ALBUTEROL NEBS EVERY  4 HRS as NEEDED

## 2016-03-11 ENCOUNTER — Ambulatory Visit (INDEPENDENT_AMBULATORY_CARE_PROVIDER_SITE_OTHER): Payer: Medicare Other | Admitting: Family Medicine

## 2016-03-11 ENCOUNTER — Encounter: Payer: Self-pay | Admitting: Family Medicine

## 2016-03-11 VITALS — BP 112/60 | HR 91 | Temp 97.6°F | Resp 12 | Ht 65.0 in | Wt 128.0 lb

## 2016-03-11 DIAGNOSIS — G4700 Insomnia, unspecified: Secondary | ICD-10-CM

## 2016-03-11 DIAGNOSIS — E559 Vitamin D deficiency, unspecified: Secondary | ICD-10-CM

## 2016-03-11 DIAGNOSIS — F411 Generalized anxiety disorder: Secondary | ICD-10-CM | POA: Diagnosis not present

## 2016-03-11 DIAGNOSIS — E785 Hyperlipidemia, unspecified: Secondary | ICD-10-CM

## 2016-03-11 DIAGNOSIS — J441 Chronic obstructive pulmonary disease with (acute) exacerbation: Secondary | ICD-10-CM

## 2016-03-11 DIAGNOSIS — I25111 Atherosclerotic heart disease of native coronary artery with angina pectoris with documented spasm: Secondary | ICD-10-CM

## 2016-03-11 LAB — COMPREHENSIVE METABOLIC PANEL
ALT: 10 U/L (ref 0–35)
AST: 18 U/L (ref 0–37)
Albumin: 3.9 g/dL (ref 3.5–5.2)
Alkaline Phosphatase: 97 U/L (ref 39–117)
BUN: 13 mg/dL (ref 6–23)
CALCIUM: 9.3 mg/dL (ref 8.4–10.5)
CHLORIDE: 104 meq/L (ref 96–112)
CO2: 28 meq/L (ref 19–32)
CREATININE: 0.71 mg/dL (ref 0.40–1.20)
GFR: 82.54 mL/min (ref >60.00)
Glucose, Bld: 115 mg/dL — ABNORMAL HIGH (ref 70–99)
POTASSIUM: 3.1 meq/L — AB (ref 3.5–5.1)
Sodium: 141 mEq/L (ref 135–145)
Total Bilirubin: 0.4 mg/dL (ref 0.2–1.2)
Total Protein: 6.7 g/dL (ref 6.0–8.3)

## 2016-03-11 LAB — LIPID PANEL
CHOL/HDL RATIO: 2
Cholesterol: 133 mg/dL (ref 0–200)
HDL: 53.7 mg/dL (ref >39.00)
LDL CALC: 67 mg/dL (ref 0–99)
NonHDL: 78.95
Triglycerides: 59 mg/dL (ref 0.0–149.0)
VLDL: 11.8 mg/dL (ref 0.0–40.0)

## 2016-03-11 LAB — VITAMIN D 25 HYDROXY (VIT D DEFICIENCY, FRACTURES): VITD: 43.52 ng/mL (ref 30.00–100.00)

## 2016-03-11 MED ORDER — TRAZODONE HCL 50 MG PO TABS: 25.0000 mg | ORAL_TABLET | Freq: Every evening | ORAL | Status: DC | PRN

## 2016-03-11 NOTE — Progress Notes (Signed)
Subjective:    Patient ID: Jessica [name], female    DOB: 30-Jun-1926, 82 y.o.   MRN: [id number]  HPI   Jessica [name] is a 82 y.o.female here today with her daughter to establish care with me,fprmer Dr Lora Havens pt.  She lives with daughter. She has Hx of osteoarthritis,COPD,PAD and CAD among some.  + Hearing loss, she could not afford hearing aid. She does not drive.   Dr Maretta Bees is her pulmonologist, Dx COPD gold stage C. She was seen yesterday and was started on Prednisone for COPD exacerbation, also reporting that medications were changed. No smoker but second hand exposure.   02/26/16 cardiologists, Dr [name] due to Hx of AS , according to daughter stress test is to be arranged. Last routine physical was about 2 years ago.  She tries to follow a healthy diet and does not exercises regularly due to gait instability but she is active.  Concerns today: Anxiety,insomnia, and labs.   Anxiety: On Lexapro, which she has takes it for years, she does not recall taking other med in the past.  Alprazolam 0.5 mg 2-3 tabs daily. She is supposed to be on Alprazolam 1/2-1 tab daily as needed, taking more, mainly at night because having trouble staying asleep. She feels like medications help. According to daughter, Ms [facility] worries about "everything", denies depressed mood or suicidal ideation. Daughter also takes medication for anxiety.   She has a cane, no falls since 11/2015. Occasional urine leakage, rest ADL's independent. 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 Fall in 11/2015 left clavicle fx, already completed home PT, orders have not been signed and [place] form was sent to me.  Vit D deficiency: She is on OTC Vit D 1000 U daily.  Hyperlipidemia: She is on Zocor 20 mg daily. Following a low fat diet. She has not noted side effects with medication.   Lab Results  Component Value Date   CHOL 168 01/18/2014   HDL 53.40 01/18/2014   LDLCALC 101* 01/18/2014   LDLDIRECT 150.0 06/12/2008   TRIG 69.0 01/18/2014   CHOLHDL 3 01/18/2014     Review of Systems  Constitutional: Negative for fever, activity change, appetite change, fatigue and unexpected weight change.  HENT: Negative for mouth sores, nosebleeds and trouble swallowing.   Eyes: Negative for redness and visual disturbance.  Respiratory: Positive for cough, shortness of breath and wheezing.   Cardiovascular: Negative for chest pain, palpitations and leg swelling.  Gastrointestinal: Negative for nausea, vomiting and abdominal pain.       Negative for changes in bowel habits.  Genitourinary: Negative for dysuria, hematuria, decreased urine volume and difficulty urinating.  Musculoskeletal: Positive for back pain and arthralgias. Negative for joint swelling.  Skin: Negative for color change and rash.  Neurological: Negative for seizures, syncope, weakness, numbness and headaches.  Psychiatric/Behavioral: Positive for sleep disturbance. Negative for suicidal ideas, hallucinations and confusion. The patient is nervous/anxious.      Current Outpatient Prescriptions on File Prior to Visit  Medication Sig Dispense Refill  . albuterol (PROVENTIL HFA;VENTOLIN HFA) 108 (90 Base) MCG/ACT inhaler 1-2 puffs every 4 hours as needed 1 Inhaler 3  . albuterol (PROVENTIL) (2.5 MG/3ML) 0.083% nebulizer solution Take 2.5 mg by nebulization every 6 (six) hours as needed for wheezing or shortness of breath.    Marland Kitchen albuterol (PROVENTIL) (2.5 MG/3ML) 0.083% nebulizer solution Take 3 mLs (2.5 mg total) by nebulization every 4 (four) hours as needed for wheezing or shortness of breath. 75  mL 12  . ALPRAZolam (XANAX) 0.5 MG tablet TAKE 1/2 TO 1 TABLET BY MOUTH 1 TIMES A DAY AS NEEDED (Patient taking differently: TAKE 1/2 TO 1 TABLET BY MOUTH 3 TIMES A DAY AS NEEDED) 90 tablet 0  . arformoterol (BROVANA) 15 MCG/2ML NEBU Take 2 mLs (15 mcg total) by nebulization 2 (two) times daily. 120 mL 6  . aspirin 81 MG tablet Take  81 mg by mouth daily.      . budesonide (PULMICORT) 0.5 MG/2ML nebulizer solution Take 2 mLs (0.5 mg total) by nebulization 2 (two) times daily. 120 mL 6  . Calcium Carbonate-Vitamin D (CALTRATE 600+D) 600-400 MG-UNIT per tablet Take 1 tablet by mouth daily.      . Cholecalciferol (VITAMIN D) 1000 UNITS capsule Take 1,000 Units by mouth daily.      Marland Kitchen escitalopram (LEXAPRO) 20 MG tablet Take 1 tablet (20 mg total) by mouth daily. 90 tablet 3  . Fluticasone-Salmeterol (ADVAIR) 250-50 MCG/DOSE AEPB Inhale 1 puff into the lungs 2 (two) times daily. 60 each 5  . HYDROcodone-acetaminophen (VICODIN) 5-500 MG per tablet Take 1 tablet by mouth every 6 (six) hours as needed for pain.     . polyethylene glycol powder (GLYCOLAX/MIRALAX) powder Take 17 g by mouth 2 (two) times daily as needed. 3350 g 3  . predniSONE (DELTASONE) 20 MG tablet Take 1 tablet (20 mg total) by mouth daily. 7 tablet 0  . simvastatin (ZOCOR) 20 MG tablet Take 1 tablet (20 mg total) by mouth at bedtime. 90 tablet 3   No current facility-administered medications on file prior to visit.     Past Medical History  Diagnosis Date  . congenital nystagmus   . Acute asthmatic bronchitis   . Venous insufficiency   . Atypical chest pain   . Other and unspecified hyperlipidemia   . DJD (degenerative joint disease)   . Low back pain syndrome   . Fibromyalgia   . Memory loss   . Anxiety     Social History   Social History  . Marital Status: Widowed    Spouse Name: N/A  . Number of Children: 3  . Years of Education: N/A   Occupational History  .     Social History Main Topics  . Smoking status: Former Smoker -- 2.00 packs/day for 30 years    Types: Cigarettes    Quit date: 10/28/1991  . Smokeless tobacco: None  . Alcohol Use: No  . Drug Use: No  . Sexual Activity: Not Asked   Other Topics Concern  . None   Social History Narrative   1 sibling alive age 37   1 sibling alive age 75   1 sibling deceased age 33   1  sibling alive age 21    Filed Vitals:   03/11/16 0952  BP: 112/60  Pulse: 91  Temp: 97.6 F (36.4 C)  Resp: 12   Body mass index is 21.3 kg/(m^2).      Objective:   Physical Exam  Constitutional: She is oriented to person, place, and time. She appears well-developed and well-nourished. No distress.  HENT:  Head: Atraumatic.  Mouth/Throat: Oropharynx is clear and moist and mucous membranes are normal.  Eyes: Conjunctivae and EOM are normal. Pupils are equal, round, and reactive to light.  Neck: No thyromegaly present.  Cardiovascular: Normal rate and regular rhythm.   Murmur heard. Pulses:      Dorsalis pedis pulses are 2+ on the right side, and 2+ on the  left side.  Base SEM II-III/VI  Pulmonary/Chest: Effort normal. No respiratory distress. She has wheezes (occasional). She has no rales.  Coughs a few times during OV.  Abdominal: Soft. She exhibits no mass. There is no tenderness.  Musculoskeletal: She exhibits no edema.  Some joint deformities on IP bilateral, limited flexion, no signs of synovitis.  Lymphadenopathy:    She has no cervical adenopathy.  Neurological: She is alert and oriented to person, place, and time. She has normal strength. Coordination normal.  Mild hand and head tremor. Gait assisted with cane, stable.  Skin: Skin is warm. No rash noted.  Psychiatric: Her mood appears anxious.  Well groomed, good eye contact.        Assessment & Plan:    Diagnoses and all orders for this visit:  Insomnia, unspecified  Still having trouble sleeping, so Trazodone recommended. Some side effects discussed. Good sleep hygiene. Continue Alprazolam 0.5 mg at bedtime, no more than a tab. F/U in 4 weeks, before if needed.  -     traZODone (DESYREL) 50 MG tablet; Take 0.5-1 tablets (25-50 mg total) by mouth at bedtime as needed for sleep.  Vitamin D deficiency  No changes in current management, will follow labs done today and will give further  recommendations accordingly. Fall precautions.  -     Comprehensive metabolic panel -     VITAMIN D 25 Hydroxy (Vit-D Deficiency, Fractures)  Generalized anxiety disorder  Still symptomatic but stable. Trazodone might help. No changes for now.Alprazolam 1/2 tab daily as needed, no more than 2 tabs total daily. Some side effects from medications discussed.  Hyperlipidemia  No changes in current management, will follow labs done today and will give further recommendations accordingly.  -     Lipid Panel -     Comprehensive metabolic panel  COPD exacerbation (HCC)  No changes in current management. Daughter attributes tremor noted today to her neb treatment, which she took before visit.  Continue Prednisone, which was recommended for 5 days. Instructed about warning signs.    -Patient advised to return or notify a doctor immediately if symptoms worsen or persist or new concerns arise.     [name], MD  [facility]. [place] office.

## 2016-03-11 NOTE — Patient Instructions (Addendum)
A few things to remember from today's visit:   1. Vitamin D deficiency No changes.  - Comprehensive metabolic panel - VITAMIN D 25 Hydroxy (Vit-D Deficiency, Fractures)  2. Generalized anxiety disorder  No refills needed.  Max Alprazolam 2 tabs daily.  Trazodone added at bedtime for sleep.   3. Hyperlipidemia No changes.  - Lipid Panel - Comprehensive metabolic panel  4. COPD exacerbation ([place]) Continue Prednisone and keep appt with pulmonologist.       If you sign-up for My chart, you can communicate easier with [name] in case you have any question or concern.

## 2016-03-11 NOTE — Progress Notes (Signed)
Pre visit review using our clinic review tool, if applicable. No additional management support is needed unless otherwise documented below in the visit note. 

## 2016-03-12 ENCOUNTER — Encounter: Payer: Self-pay | Admitting: Internal Medicine

## 2016-03-12 DIAGNOSIS — J449 Chronic obstructive pulmonary disease, unspecified: Secondary | ICD-10-CM | POA: Diagnosis not present

## 2016-03-18 ENCOUNTER — Ambulatory Visit: Payer: Medicare Other | Admitting: Nurse Practitioner

## 2016-03-25 ENCOUNTER — Other Ambulatory Visit: Payer: Self-pay

## 2016-03-25 ENCOUNTER — Telehealth: Payer: Self-pay

## 2016-03-25 DIAGNOSIS — J449 Chronic obstructive pulmonary disease, unspecified: Secondary | ICD-10-CM

## 2016-03-25 NOTE — Telephone Encounter (Signed)
LMOVM for pt in regards of ONO results. Will await call back

## 2016-03-26 NOTE — Telephone Encounter (Signed)
Pt & daughter both aware of need for 2L 02 @ bedtime. Order placed. Nothing further needed

## 2016-03-27 ENCOUNTER — Ambulatory Visit: Payer: Medicare Other | Admitting: Internal Medicine

## 2016-03-31 ENCOUNTER — Ambulatory Visit (INDEPENDENT_AMBULATORY_CARE_PROVIDER_SITE_OTHER): Payer: Medicare Other | Admitting: *Deleted

## 2016-03-31 DIAGNOSIS — J449 Chronic obstructive pulmonary disease, unspecified: Secondary | ICD-10-CM

## 2016-03-31 DIAGNOSIS — J42 Unspecified chronic bronchitis: Secondary | ICD-10-CM | POA: Diagnosis not present

## 2016-03-31 LAB — PULMONARY FUNCTION TEST
DL/VA % pred: 69 %
DL/VA: 3.24 ml/min/mmHg/L
DLCO UNC % PRED: 70 %
DLCO UNC: 16.12 ml/min/mmHg
FEF 25-75 Post: 0.83 L/sec
FEF 25-75 Pre: 0.53 L/sec
FEF2575-%CHANGE-POST: 55 %
FEF2575-%PRED-POST: 90 %
FEF2575-%Pred-Pre: 57 %
FEV1-%CHANGE-POST: 22 %
FEV1-%PRED-POST: 88 %
FEV1-%Pred-Pre: 72 %
FEV1-POST: 1.38 L
FEV1-Pre: 1.13 L
FEV1FVC-%CHANGE-POST: 8 %
FEV1FVC-%Pred-Pre: 72 %
FEV6-%Change-Post: 16 %
FEV6-%PRED-POST: 122 %
FEV6-%PRED-PRE: 105 %
FEV6-PRE: 2.07 L
FEV6-Post: 2.41 L
FEV6FVC-%CHANGE-POST: 2 %
FEV6FVC-%PRED-PRE: 103 %
FEV6FVC-%Pred-Post: 106 %
FVC-%Change-Post: 13 %
FVC-%Pred-Post: 115 %
FVC-%Pred-Pre: 102 %
FVC-Post: 2.44 L
FVC-Pre: 2.16 L
POST FEV1/FVC RATIO: 56 %
POST FEV6/FVC RATIO: 99 %
PRE FEV6/FVC RATIO: 96 %
Pre FEV1/FVC ratio: 52 %

## 2016-03-31 NOTE — Progress Notes (Signed)
SMW performed today. 

## 2016-03-31 NOTE — Progress Notes (Signed)
PFT performed with nitrogen washout today.

## 2016-04-01 ENCOUNTER — Ambulatory Visit: Payer: Medicare Other | Admitting: Internal Medicine

## 2016-04-01 ENCOUNTER — Encounter: Payer: Self-pay | Admitting: Internal Medicine

## 2016-04-01 ENCOUNTER — Ambulatory Visit (INDEPENDENT_AMBULATORY_CARE_PROVIDER_SITE_OTHER): Payer: Medicare Other | Admitting: Internal Medicine

## 2016-04-01 VITALS — BP 128/84 | HR 86 | Ht 63.0 in | Wt 127.0 lb

## 2016-04-01 DIAGNOSIS — I25111 Atherosclerotic heart disease of native coronary artery with angina pectoris with documented spasm: Secondary | ICD-10-CM

## 2016-04-01 DIAGNOSIS — J449 Chronic obstructive pulmonary disease, unspecified: Secondary | ICD-10-CM

## 2016-04-01 NOTE — Progress Notes (Signed)
[facility] Consultation     Date: 04/01/2016,   MRN# [id number] Jessica [name] [facility] Dec 17, 1925 Code Status:  Code Status History    This patient does not have a recorded code status. Please follow your organizational policy for patients in this situation.     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    CHIEF COMPLAINT:   SOb, wheezing and cough, follow up COPD   HISTORY OF PRESENT ILLNESS    82 yo white female former smoker, quit 35 years ago, has dx of COPD unabe to take inhaled meds at this time, so prescribed nebulized therapy and seems to helping her breathing No sign signs of infection at this time  PFT's 01/2015 Ratio 54%, Fev1 65%, DLCO 64% TLC 101%, RV 119% Interpretation: moderate COPD with BD response with hyperinflation and air trapping with diffusion impairment  PFT 03/31/2016 Ratio 52% FEV1 72% DLCO 70% TLC 88% RV 90% Interpretation: mild obstructive disease with BD response  ONO shows hypoxia-oxygen ordered  Doing well today, no acute complaints at this time    Current Medication:   Current outpatient prescriptions:  .  albuterol (PROVENTIL HFA;VENTOLIN HFA) 108 (90 Base) MCG/ACT inhaler, 1-2 puffs every 4 hours as needed, Disp: 1 Inhaler, Rfl: 3 .  albuterol (PROVENTIL) (2.5 MG/3ML) 0.083% nebulizer solution, Take 2.5 mg by nebulization every 6 (six) hours as needed for wheezing or shortness of breath., Disp: , Rfl:  .  albuterol (PROVENTIL) (2.5 MG/3ML) 0.083% nebulizer solution, Take 3 mLs (2.5 mg total) by nebulization every 4 (four) hours as needed for wheezing or shortness of breath., Disp: 75 mL, Rfl: 12 .  ALPRAZolam (XANAX) 0.5 MG tablet, TAKE 1/2 TO 1 TABLET BY MOUTH 1 TIMES A DAY AS NEEDED (Patient taking differently: TAKE 1/2 TO 1 TABLET BY MOUTH 3 TIMES A DAY AS NEEDED), Disp: 90  tablet, Rfl: 0 .  arformoterol (BROVANA) 15 MCG/2ML NEBU, Take 2 mLs (15 mcg total) by nebulization 2 (two) times daily., Disp: 120 mL, Rfl: 6 .  aspirin 81 MG tablet, Take 81 mg by mouth daily.  , Disp: , Rfl:  .  budesonide (PULMICORT) 0.5 MG/2ML nebulizer solution, Take 2 mLs (0.5 mg total) by nebulization 2 (two) times daily., Disp: 120 mL, Rfl: 6 .  Calcium Carbonate-Vitamin D (CALTRATE 600+D) 600-400 MG-UNIT per tablet, Take 1 tablet by mouth daily.  , Disp: , Rfl:  .  Cholecalciferol (VITAMIN D) 1000 UNITS capsule, Take 1,000 Units by mouth daily.  , Disp: , Rfl:  .  escitalopram (LEXAPRO) 20 MG tablet, Take 1 tablet (20 mg total) by mouth daily., Disp: 90 tablet, Rfl: 3 .  Fluticasone-Salmeterol (ADVAIR) 250-50 MCG/DOSE AEPB, Inhale 1 puff into the lungs 2 (two) times daily., Disp: 60 each, Rfl: 5 .  HYDROcodone-acetaminophen (VICODIN) 5-500 MG per tablet, Take 1 tablet by mouth every 6 (six) hours as needed for pain. , Disp: , Rfl:  .  polyethylene glycol powder (GLYCOLAX/MIRALAX) powder, Take 17 g by mouth 2 (two) times daily as needed., Disp: 3350 g, Rfl: 3 .  predniSONE (DELTASONE) 20 MG tablet, Take 1 tablet (20 mg total) by mouth daily., Disp: 7 tablet, Rfl: 0 .  simvastatin (ZOCOR) 20 MG tablet, Take 1 tablet (20 mg total) by mouth at  bedtime., Disp: 90 tablet, Rfl: 3 .  traZODone (DESYREL) 50 MG tablet, Take 0.5-1 tablets (25-50 mg total) by mouth at bedtime as needed for sleep., Disp: 30 tablet, Rfl: 1     ALLERGIES   Review of patient's allergies indicates no known allergies.     REVIEW OF SYSTEMS   Review of Systems  Constitutional: Negative for fever, chills, weight loss and malaise/fatigue.  HENT: Negative for congestion and hearing loss.   Respiratory: Negative for cough, hemoptysis, sputum production, shortness of breath and wheezing.   Cardiovascular: Negative for chest pain, palpitations, orthopnea and leg swelling.  Gastrointestinal: Negative for heartburn and  nausea.  Skin: Negative for rash.  Neurological: Negative for dizziness and headaches.  All other systems reviewed and are negative.    VS: BP 128/84 mmHg  Pulse 86  Ht 5\' 3"  (1.6 m)  Wt 127 lb (57.607 kg)  BMI 22.50 kg/m2  SpO2 93%     PHYSICAL EXAM   Physical Exam  Constitutional: She is oriented to person, place, and time. No distress.  Cardiovascular: Normal rate, regular rhythm and normal heart sounds.   No murmur heard. Pulmonary/Chest: Effort normal and breath sounds normal. No stridor. No respiratory distress. She has no wheezes. She has no rales.  Abdominal: Soft.  Neurological: She is alert and oriented to person, place, and time. No cranial nerve deficit.  Skin: Skin is warm. She is not diaphoretic.  Psychiatric: She has a normal mood and affect.       ASSESSMENT/PLAN   82 yo white female with Mild/Moderate COPD Gold STage C  patient very frail breathing has improved, feels better.  1.will continue Pulmicort Nebs 2.will continue LABA neb therapy with Brovana 3.albuterol Nebs every 4 hrs as needed 4.No need for abx adn steroids at this time. 5.Continue oxygen at night  Follow up in 3 months    The Patient requires high complexity decision making for assessment and support, frequent evaluation and titration of therapies, application of advanced monitoring technologies and extensive interpretation of multiple databases.  Patient/Family are satisfied with Plan of action and management. All questions answered   Corrin Parker, M.D.  [facility]  Medical Director [place] Director [facility] Cardio-Pulmonary Department

## 2016-04-01 NOTE — Patient Instructions (Signed)
Chronic Obstructive Pulmonary Disease Chronic obstructive pulmonary disease (COPD) is a common lung condition in which airflow from the lungs is limited. COPD is a general term that can be used to describe many different lung problems that limit airflow, including both chronic bronchitis and emphysema. If you have COPD, your lung function will probably never return to normal, but there are measures you can take to improve lung function and make yourself feel better. CAUSES   Smoking (common).  Exposure to secondhand smoke.  Genetic problems.  Chronic inflammatory lung diseases or recurrent infections. SYMPTOMS  Shortness of breath, especially with physical activity.  Deep, persistent (chronic) cough with a large amount of thick mucus.  Wheezing.  Rapid breaths (tachypnea).  Gray or bluish discoloration (cyanosis) of the skin, especially in your fingers, toes, or lips.  Fatigue.  Weight loss.  Frequent infections or episodes when breathing symptoms become much worse (exacerbations).  Chest tightness. DIAGNOSIS Your health care provider will take a medical history and perform a physical examination to diagnose COPD. Additional tests for COPD may include:  Lung (pulmonary) function tests.  Chest X-ray.  CT scan.  Blood tests. TREATMENT  Treatment for COPD may include:  Inhaler and nebulizer medicines. These help manage the symptoms of COPD and make your breathing more comfortable.  Supplemental oxygen. Supplemental oxygen is only helpful if you have a low oxygen level in your blood.  Exercise and physical activity. These are beneficial for nearly all people with COPD.  Lung surgery or transplant.  Nutrition therapy to gain weight, if you are underweight.  Pulmonary rehabilitation. This may involve working with a team of health care providers and specialists, such as respiratory, occupational, and physical therapists. HOME CARE INSTRUCTIONS  Take all medicines  (inhaled or pills) as directed by your health care provider.  Avoid over-the-counter medicines or cough syrups that dry up your airway (such as antihistamines) and slow down the elimination of secretions unless instructed otherwise by your health care provider.  If you are a smoker, the most important thing that you can do is stop smoking. Continuing to smoke will cause further lung damage and breathing trouble. Ask your health care provider for help with quitting smoking. He or she can direct you to community resources or hospitals that provide support.  Avoid exposure to irritants such as smoke, chemicals, and fumes that aggravate your breathing.  Use oxygen therapy and pulmonary rehabilitation if directed by your health care provider. If you require home oxygen therapy, ask your health care provider whether you should purchase a pulse oximeter to measure your oxygen level at home.  Avoid contact with individuals who have a contagious illness.  Avoid extreme temperature and humidity changes.  Eat healthy foods. Eating smaller, more frequent meals and resting before meals may help you maintain your strength.  Stay active, but balance activity with periods of rest. Exercise and physical activity will help you maintain your ability to do things you want to do.  Preventing infection and hospitalization is very important when you have COPD. Make sure to receive all the vaccines your health care provider recommends, especially the pneumococcal and influenza vaccines. Ask your health care provider whether you need a pneumonia vaccine.  Learn and use relaxation techniques to manage stress.  Learn and use controlled breathing techniques as directed by your health care provider. Controlled breathing techniques include:  Pursed lip breathing. Start by breathing in (inhaling) through your nose for 1 second. Then, purse your lips as if you were   going to whistle and breathe out (exhale) through the  pursed lips for 2 seconds.  Diaphragmatic breathing. Start by putting one hand on your abdomen just above your waist. Inhale slowly through your nose. The hand on your abdomen should move out. Then purse your lips and exhale slowly. You should be able to feel the hand on your abdomen moving in as you exhale.  Learn and use controlled coughing to clear mucus from your lungs. Controlled coughing is a series of short, progressive coughs. The steps of controlled coughing are: 1. Lean your head slightly forward. 2. Breathe in deeply using diaphragmatic breathing. 3. Try to hold your breath for 3 seconds. 4. Keep your mouth slightly open while coughing twice. 5. Spit any mucus out into a tissue. 6. Rest and repeat the steps once or twice as needed. SEEK MEDICAL CARE IF:  You are coughing up more mucus than usual.  There is a change in the color or thickness of your mucus.  Your breathing is more labored than usual.  Your breathing is faster than usual. SEEK IMMEDIATE MEDICAL CARE IF:  You have shortness of breath while you are resting.  You have shortness of breath that prevents you from:  Being able to talk.  Performing your usual physical activities.  You have chest pain lasting longer than 5 minutes.  Your skin color is more cyanotic than usual.  You measure low oxygen saturations for longer than 5 minutes with a pulse oximeter. MAKE SURE YOU:  Understand these instructions.  Will watch your condition.  Will get help right away if you are not doing well or get worse.   This information is not intended to replace advice given to you by your health care provider. Make sure you discuss any questions you have with your health care provider.   Document Released: 07/23/2005 Document Revised: 11/03/2014 Document Reviewed: 06/09/2013 Elsevier Interactive Patient Education 2016 [employer] Inc[employer]  

## 2016-04-08 ENCOUNTER — Ambulatory Visit (INDEPENDENT_AMBULATORY_CARE_PROVIDER_SITE_OTHER): Payer: Medicare Other | Admitting: Family Medicine

## 2016-04-08 ENCOUNTER — Encounter: Payer: Self-pay | Admitting: Family Medicine

## 2016-04-08 VITALS — BP 110/62 | HR 98 | Temp 98.5°F | Resp 12 | Ht 63.0 in | Wt 127.7 lb

## 2016-04-08 DIAGNOSIS — F411 Generalized anxiety disorder: Secondary | ICD-10-CM | POA: Diagnosis not present

## 2016-04-08 DIAGNOSIS — F419 Anxiety disorder, unspecified: Secondary | ICD-10-CM | POA: Insufficient documentation

## 2016-04-08 DIAGNOSIS — R739 Hyperglycemia, unspecified: Secondary | ICD-10-CM

## 2016-04-08 DIAGNOSIS — E876 Hypokalemia: Secondary | ICD-10-CM | POA: Diagnosis not present

## 2016-04-08 DIAGNOSIS — I25111 Atherosclerotic heart disease of native coronary artery with angina pectoris with documented spasm: Secondary | ICD-10-CM | POA: Diagnosis not present

## 2016-04-08 DIAGNOSIS — G4700 Insomnia, unspecified: Secondary | ICD-10-CM | POA: Diagnosis not present

## 2016-04-08 LAB — POTASSIUM: Potassium: 3.8 mEq/L (ref 3.5–5.1)

## 2016-04-08 LAB — HEMOGLOBIN A1C: HEMOGLOBIN A1C: 5.4 % (ref 4.6–6.5)

## 2016-04-08 MED ORDER — ALPRAZOLAM 0.5 MG PO TABS: ORAL_TABLET | ORAL | Status: DC

## 2016-04-08 MED ORDER — TRAZODONE HCL 50 MG PO TABS: 50.0000 mg | ORAL_TABLET | Freq: Every day | ORAL | Status: DC

## 2016-04-08 NOTE — Progress Notes (Signed)
HPI:   [name] is a 82 y.o. female, who is here today with her daugther to follow on her last OV.  Hx of anxiety and insomnia, last OV Trazodone was added, she is taking 25 mg at bedtime and able to sleep through the night, 6-7 hours. She is still taking Xanax 0.5 mg at bedtime and daughter tells me that she takes a tab tid as needed. Last OV we discussed this issue, she is taking it more than recommended on prescription label.  She is also on Lexapro, which according to daughter helps greatly.  Recently she was seen by pulmonologist for COPD, on supplemental O2 at night.  No concerns today.  Last OV lab work was done, HypoK+ at 3.1 and hyperglycemia at 115.   Review of Systems  Constitutional: Positive for fatigue (no more than usual). Negative for fever, activity change, appetite change and unexpected weight change.  HENT: Negative for facial swelling, mouth sores, nosebleeds and trouble swallowing.   Respiratory: Positive for cough and wheezing. Negative for shortness of breath.        Hx of COPD, stable symptoms.  Cardiovascular: Negative for chest pain, palpitations and leg swelling.  Gastrointestinal: Negative for nausea, vomiting and abdominal pain.       No changes in bowel habits.  Genitourinary: Negative for dysuria, hematuria and decreased urine volume.  Musculoskeletal: Positive for arthralgias (chronic) and gait problem (stable, has a cane).  Skin: Negative for rash.  Neurological: Negative for dizziness, syncope, weakness, numbness and headaches.  Psychiatric/Behavioral: Positive for sleep disturbance (improved with Trazodone.). Negative for suicidal ideas, hallucinations, confusion and agitation. The patient is nervous/anxious.       Current Outpatient Prescriptions on File Prior to Visit  Medication Sig Dispense Refill  . albuterol (PROVENTIL HFA;VENTOLIN HFA) 108 (90 Base) MCG/ACT inhaler 1-2 puffs every 4 hours as needed 1 Inhaler 3  .  albuterol (PROVENTIL) (2.5 MG/3ML) 0.083% nebulizer solution Take 3 mLs (2.5 mg total) by nebulization every 4 (four) hours as needed for wheezing or shortness of breath. 75 mL 12  . arformoterol (BROVANA) 15 MCG/2ML NEBU Take 2 mLs (15 mcg total) by nebulization 2 (two) times daily. 120 mL 6  . aspirin 81 MG tablet Take 81 mg by mouth daily.      . budesonide (PULMICORT) 0.5 MG/2ML nebulizer solution Take 2 mLs (0.5 mg total) by nebulization 2 (two) times daily. 120 mL 6  . Calcium Carbonate-Vitamin D (CALTRATE 600+D) 600-400 MG-UNIT per tablet Take 1 tablet by mouth daily.      . Cholecalciferol (VITAMIN D) 1000 UNITS capsule Take 1,000 Units by mouth daily.      Marland Kitchen escitalopram (LEXAPRO) 20 MG tablet Take 1 tablet (20 mg total) by mouth daily. 90 tablet 3  . Fluticasone-Salmeterol (ADVAIR) 250-50 MCG/DOSE AEPB Inhale 1 puff into the lungs 2 (two) times daily. 60 each 5  . HYDROcodone-acetaminophen (VICODIN) 5-500 MG per tablet Take 1 tablet by mouth every 6 (six) hours as needed for pain.     . polyethylene glycol powder (GLYCOLAX/MIRALAX) powder Take 17 g by mouth 2 (two) times daily as needed. 3350 g 3  . predniSONE (DELTASONE) 20 MG tablet Take 1 tablet (20 mg total) by mouth daily. 7 tablet 0  . simvastatin (ZOCOR) 20 MG tablet Take 1 tablet (20 mg total) by mouth at bedtime. 90 tablet 3   No current facility-administered medications on file prior to visit.     Past Medical  History  Diagnosis Date  . congenital nystagmus   . Acute asthmatic bronchitis   . Venous insufficiency   . Atypical chest pain   . Other and unspecified hyperlipidemia   . DJD (degenerative joint disease)   . Low back pain syndrome   . Fibromyalgia   . Memory loss   . Anxiety    No Known Allergies  Social History   Social History  . Marital Status: Widowed    Spouse Name: N/A  . Number of Children: 3  . Years of Education: N/A   Occupational History  .     Social History Main Topics  . Smoking  status: Former Smoker -- 2.00 packs/day for 30 years    Types: Cigarettes    Quit date: 10/28/1991  . Smokeless tobacco: None  . Alcohol Use: No  . Drug Use: No  . Sexual Activity: Not Asked   Other Topics Concern  . None   Social History Narrative   1 sibling alive age 61   1 sibling alive age 50   1 sibling deceased age 56   1 sibling alive age 9    Filed Vitals:   04/08/16 0824  BP: 110/62  Pulse: 98  Temp: 98.5 F (36.9 C)  Resp: 12   Body mass index is 22.63 kg/(m^2).  SpO2 Readings from Last 3 Encounters:  04/08/16 92%  04/01/16 93%  03/11/16 93%     Physical Exam  Constitutional: She is oriented to person, place, and time. She appears well-developed and well-nourished. No distress.  HENT:  Head: Atraumatic.  Mouth/Throat: Oropharynx is clear and moist and mucous membranes are normal.  Eyes: Conjunctivae are normal.  Cardiovascular: Normal rate and regular rhythm.   Murmur heard. SEM II/VI RUSB>LUSB. DP pulses present bilateral.  Respiratory: Effort normal and breath sounds normal. No respiratory distress.  Occasional cough during examination.  GI: There is no tenderness.  Musculoskeletal: She exhibits no edema.  Lymphadenopathy:    She has no cervical adenopathy.  Neurological: She is alert and oriented to person, place, and time. Coordination normal.  Stable gait with a cane.  Skin: Skin is warm. No erythema.  Psychiatric: She has a normal mood and affect.  Well groomed, good eye contact.      ASSESSMENT AND PLAN:    Jessica [name] was seen today for follow-up.  Diagnoses and all orders for this visit:  Insomnia, unspecified  Improved. I recommended decreasing dose of Alprazolam from 1 tablet to half tablet at bedtime and increase dose of Trazodone 25 mg 50 mg. We discussed some side effects of these medications, at her age I feel like Trazodone is safer to treat insomnia. Good sleep hygiene. Follow-up in 4 months.  -     traZODone (DESYREL)  50 MG tablet; Take 1 tablet (50 mg total) by mouth at bedtime.  Hypokalemia  For the recommendations would be given according to lab results. For now continue with potassium rich diet.  -     Potassium  Hyperglycemia  Further recommendations would be given according to lab results.  -     Hemoglobin A1c  Generalized anxiety disorder  I recommend decreasing alprazolam dose from 3 times daily as needed to 1/2-1 tablet during the day and half tablet at night. We discussed side effects of this medication given her age. No changes in Lexapro. Follow-up in 4 months.  -     ALPRAZolam (XANAX) 0.5 MG tablet; TAKE 1/2 TO 1 TABLET BY MOUTH 1 TIME  during the DAY AS NEEDED and 1/2 tab at bedtime.       -She advised to return sooner than planned today if  new concerns arise.       [name], MD  [facility]. [place] office.

## 2016-04-08 NOTE — Patient Instructions (Signed)
A few things to remember from today's visit:   1. Insomnia   2. Hypokalemia   3. Hyperglycemia   4. Generalized anxiety disorder  Xanax to continue 1/2 tab at bedtime and 1/2-1 tab during the day if needed. Trazodone to increase from 25 mg to 50 mg.  Fall precautions. No changes in rest. Continue following with pulmonologist.      If you sign-up for My chart, you can communicate easier with [name] in case you have any question or concern.

## 2016-04-14 DIAGNOSIS — H353213 Exudative age-related macular degeneration, right eye, with inactive scar: Secondary | ICD-10-CM | POA: Diagnosis not present

## 2016-04-14 DIAGNOSIS — H43813 Vitreous degeneration, bilateral: Secondary | ICD-10-CM | POA: Diagnosis not present

## 2016-04-14 DIAGNOSIS — H353222 Exudative age-related macular degeneration, left eye, with inactive choroidal neovascularization: Secondary | ICD-10-CM | POA: Diagnosis not present

## 2016-04-26 ENCOUNTER — Other Ambulatory Visit: Payer: Self-pay | Admitting: Family Medicine

## 2016-04-26 DIAGNOSIS — J449 Chronic obstructive pulmonary disease, unspecified: Secondary | ICD-10-CM

## 2016-04-28 NOTE — Telephone Encounter (Signed)
I believe she follows with pulmonologist. I sent a  Refill to pharmacy, in the future , if she is still following with pulmonologist, she needs to call his office. Thanks, BJ

## 2016-06-17 ENCOUNTER — Telehealth: Payer: Self-pay | Admitting: Family Medicine

## 2016-06-17 DIAGNOSIS — G5600 Carpal tunnel syndrome, unspecified upper limb: Secondary | ICD-10-CM | POA: Diagnosis not present

## 2016-06-17 DIAGNOSIS — M7989 Other specified soft tissue disorders: Secondary | ICD-10-CM | POA: Diagnosis not present

## 2016-06-17 DIAGNOSIS — F419 Anxiety disorder, unspecified: Secondary | ICD-10-CM | POA: Diagnosis not present

## 2016-06-17 DIAGNOSIS — F329 Major depressive disorder, single episode, unspecified: Secondary | ICD-10-CM | POA: Diagnosis not present

## 2016-06-17 DIAGNOSIS — Z87891 Personal history of nicotine dependence: Secondary | ICD-10-CM | POA: Diagnosis not present

## 2016-06-17 DIAGNOSIS — L03115 Cellulitis of right lower limb: Secondary | ICD-10-CM | POA: Diagnosis not present

## 2016-06-17 DIAGNOSIS — E785 Hyperlipidemia, unspecified: Secondary | ICD-10-CM | POA: Diagnosis not present

## 2016-06-17 DIAGNOSIS — H3530 Unspecified macular degeneration: Secondary | ICD-10-CM | POA: Diagnosis not present

## 2016-06-17 DIAGNOSIS — I10 Essential (primary) hypertension: Secondary | ICD-10-CM | POA: Diagnosis not present

## 2016-06-17 NOTE — Telephone Encounter (Signed)
I will call and check on them in the morning. The daughter has already been contacted and they are going to take the patient to the emergency department.

## 2016-06-17 NOTE — Telephone Encounter (Signed)
[facility]  [place]    --------------------------------------------------------------------------------   Patient Name: PARNEET CHAVANA  Gender: Female  DOB: April 14, 1926   Age: 82 Y 31 M 8 D  Return Phone Number: [phone] (Primary)  Address:     City/State/Zip:       Client [facility] - Client  Client Site [facility] - Day  Physician [name], Betty - MD  Contact Type Call  Who Is Calling Patient / Member / Family / Caregiver  Call Type Triage / Clinical  Caller Name [place]  Relationship To Patient Daughter  Return Phone Number [phone] (Primary)  Chief Complaint Leg Pain  Reason for Call Symptomatic / Request for Health Information  Initial Comment Her mother is having swelling in her right ankle over the weekend. Now she has a red mark going up her leg towards her knee.  Appointment Disposition EMR Appointment Attempted - Not Scheduled  Info pasted into Epic Yes  [place] Not Listed [facility]   PreDisposition Go to ED  Translation No       Nurse Assessment  Nurse: Venetia Maxon, RN, Manuela Schwartz Date/Time (Eastern Time): 06/17/2016 4:19:32 PM  Confirm and document reason for call. If symptomatic, describe symptoms. You must click the next button to save text entered. ---Her mother is having swelling in her right ankle over the weekend. Now she has a red mark going up her leg towards her knee. no thermometer. ( they are at work now) there were some brown spots. that she scratched. She has not had chills    Has the patient traveled out of the country within the last 30 days? ---No    Does the patient have any new or worsening symptoms? ---Yes    Will a triage be completed? ---Yes    Related visit to physician within the last 2 weeks? ---No    Does the PT have any chronic conditions? (i.e. diabetes, asthma, etc.) ---Yes    List chronic  conditions. ---antidepressant , cholesterol. RT knee replacement done 4 yrs ago    Is this a behavioral health or substance abuse call? ---No           Guidelines          Guideline Title Affirmed Question Affirmed Notes Nurse Date/Time (Eastern Time)  Boil (Skin Abscess) Red streak from area of infection    Venetia Maxon, RN, Manuela Schwartz 06/17/2016 4:22:02 PM    Disp. Time Eilene Ghazi Time) Disposition Final User         06/17/2016 4:23:20 PM See Physician within 4 Hours (or PCP triage) Yes Venetia Maxon, RN, Edwena Bunde Understands: Yes  Disagree/Comply: Comply       Care Advice Given Per Guideline        SEE PHYSICIAN WITHIN 4 HOURS (or PCP triage): CALL BACK IF: * You become worse. CARE ADVICE per Boil and Abscess (Adult) guideline.        --------------------------------------------------------------------------------            Referrals  GO TO FACILITY OTHER - SPECIFY

## 2016-06-17 NOTE — Telephone Encounter (Signed)
Please Advise:  Called pt's daughter and she informed me that she just got off work and is taking Mrs.Kosch to the ED soon.   Please Follow up.

## 2016-06-18 ENCOUNTER — Other Ambulatory Visit: Payer: Self-pay | Admitting: Internal Medicine

## 2016-06-18 ENCOUNTER — Telehealth: Payer: Self-pay

## 2016-06-18 DIAGNOSIS — F411 Generalized anxiety disorder: Secondary | ICD-10-CM

## 2016-06-18 NOTE — Telephone Encounter (Signed)
Okay for refill?  

## 2016-06-18 NOTE — Telephone Encounter (Signed)
Called daughter to see how patient is doing. She did go to the emergency department and is on antibiotics. They did want to follow up with [name], so appt scheduled for tomorrow 8/24 at 10:30 for 30 min appt.

## 2016-06-19 ENCOUNTER — Ambulatory Visit (INDEPENDENT_AMBULATORY_CARE_PROVIDER_SITE_OTHER): Payer: Medicare Other | Admitting: Family Medicine

## 2016-06-19 ENCOUNTER — Encounter: Payer: Self-pay | Admitting: Family Medicine

## 2016-06-19 VITALS — BP 138/70 | HR 75 | Resp 12 | Ht 63.0 in | Wt 123.5 lb

## 2016-06-19 DIAGNOSIS — F411 Generalized anxiety disorder: Secondary | ICD-10-CM | POA: Diagnosis not present

## 2016-06-19 DIAGNOSIS — I25111 Atherosclerotic heart disease of native coronary artery with angina pectoris with documented spasm: Secondary | ICD-10-CM | POA: Diagnosis not present

## 2016-06-19 DIAGNOSIS — I8001 Phlebitis and thrombophlebitis of superficial vessels of right lower extremity: Secondary | ICD-10-CM | POA: Diagnosis not present

## 2016-06-19 DIAGNOSIS — G4700 Insomnia, unspecified: Secondary | ICD-10-CM

## 2016-06-19 MED ORDER — ALPRAZOLAM 0.5 MG PO TABS: ORAL_TABLET | ORAL | 2 refills | Status: DC

## 2016-06-19 NOTE — Progress Notes (Signed)
HPI:   [name] is a 82 y.o. female, who is here today (by herself) to follow on recent office/ER visit.  She was seen on 06/18/16 in the ER, [facility], at the time of the visit I don't have records.  According to patient, she presented to the ER because right calf erythema and edema for a couple days, sudden onset. No hx of trauma and no associated fever, chills, cough, wheezing, dyspnea, palpitations, or MS changes.  According to patient, blood work was done as well as lower extremity imaging, Doppler ultrasound according to summary discharge. I don't have any report or results from these labs.  She was discharged on Keflex 500 mg qid  and Bactrim DS bid, today is on her second day. She denies any side effect from  medication. She has noted improvement on LE edema and erythema, she has not noted any fever, chills, or changes in appetite. This morning she noted right groin "soreness" with palpation and mild with movement.   Denies abdominal pain, nausea, vomiting, changes in bowel habits, blood in stool or melena.    -Today she is also requesting refill for Alprazolam, I had received a couple request from pharmacy for the past 2 days. She has Hx of anxiety and insomnia, she is supposed to take Alprazolam 0.5 mg 1/2-1 tab during the day and 1/2 tab at bedtime. Trazodone 50 mg was added last OV, 04/08/16,she is not sure if she is taking it.  She also takes Lexapro 20 mg daily. Denies depressed mood or suicidal thoughts. + Anxiety. Her daughter usually takes care of her meds, she could not come today.    Review of Systems  Constitutional: Negative for activity change, appetite change, fatigue, fever and unexpected weight change.  HENT: Negative for mouth sores, nosebleeds and trouble swallowing.   Respiratory: Negative for cough, shortness of breath and wheezing.   Cardiovascular: Positive for leg swelling. Negative for chest pain and palpitations.    Gastrointestinal: Negative for abdominal pain, nausea and vomiting.       Negative for changes in bowel habits.  Genitourinary: Negative for decreased urine volume, difficulty urinating, dysuria and hematuria.  Musculoskeletal: Positive for arthralgias and gait problem. Negative for myalgias.  Skin: Positive for rash. Negative for pallor and wound.  Neurological: Negative for seizures, syncope, weakness, numbness and headaches.  Psychiatric/Behavioral: Positive for sleep disturbance. Negative for confusion. The patient is nervous/anxious.       Current Outpatient Prescriptions on File Prior to Visit  Medication Sig Dispense Refill  . albuterol (PROVENTIL) (2.5 MG/3ML) 0.083% nebulizer solution Take 3 mLs (2.5 mg total) by nebulization every 4 (four) hours as needed for wheezing or shortness of breath. 75 mL 12  . albuterol (VENTOLIN HFA) 108 (90 Base) MCG/ACT inhaler Inhale 2 puffs into the lungs every 6 (six) hours as needed for wheezing or shortness of breath. Do not use if using nebs. 18 Inhaler 0  . arformoterol (BROVANA) 15 MCG/2ML NEBU Take 2 mLs (15 mcg total) by nebulization 2 (two) times daily. 120 mL 6  . aspirin 81 MG tablet Take 81 mg by mouth daily.      . budesonide (PULMICORT) 0.5 MG/2ML nebulizer solution Take 2 mLs (0.5 mg total) by nebulization 2 (two) times daily. 120 mL 6  . Calcium Carbonate-Vitamin D (CALTRATE 600+D) 600-400 MG-UNIT per tablet Take 1 tablet by mouth daily.      . Cholecalciferol (VITAMIN D) 1000 UNITS capsule Take 1,000 Units  by mouth daily.      Marland Kitchen escitalopram (LEXAPRO) 20 MG tablet Take 1 tablet (20 mg total) by mouth daily. 90 tablet 3  . Fluticasone-Salmeterol (ADVAIR) 250-50 MCG/DOSE AEPB Inhale 1 puff into the lungs 2 (two) times daily. 60 each 5  . HYDROcodone-acetaminophen (VICODIN) 5-500 MG per tablet Take 1 tablet by mouth every 6 (six) hours as needed for pain.     . polyethylene glycol powder (GLYCOLAX/MIRALAX) powder Take 17 g by mouth 2  (two) times daily as needed. 3350 g 3  . predniSONE (DELTASONE) 20 MG tablet Take 1 tablet (20 mg total) by mouth daily. 7 tablet 0  . simvastatin (ZOCOR) 20 MG tablet Take 1 tablet (20 mg total) by mouth at bedtime. 90 tablet 3  . traZODone (DESYREL) 50 MG tablet Take 1 tablet (50 mg total) by mouth at bedtime. 30 tablet 3   No current facility-administered medications on file prior to visit.      Past Medical History:  Diagnosis Date  . Acute asthmatic bronchitis   . Anxiety   . Atypical chest pain   . congenital nystagmus   . DJD (degenerative joint disease)   . Fibromyalgia   . Low back pain syndrome   . Memory loss   . Other and unspecified hyperlipidemia   . Venous insufficiency    No Known Allergies  Social History   Social History  . Marital status: Widowed    Spouse name: N/A  . Number of children: 3  . Years of education: N/A   Occupational History  .  Retired   Social History Main Topics  . Smoking status: Former Smoker    Packs/day: 2.00    Years: 30.00    Types: Cigarettes    Quit date: 10/28/1991  . Smokeless tobacco: None  . Alcohol use No  . Drug use: No  . Sexual activity: Not Asked   Other Topics Concern  . None   Social History Narrative   1 sibling alive age 76   1 sibling alive age 18   1 sibling deceased age 62   1 sibling alive age 5    Vitals:   06/19/16 1022  BP: 138/70  Pulse: 75  Resp: 12   Body mass index is 21.88 kg/m.    Physical Exam  Nursing note and vitals reviewed. Constitutional: She is oriented to person, place, and time. She appears well-developed and well-nourished. She does not appear ill. No distress.  HENT:  Head: Atraumatic.  Mouth/Throat: Oropharynx is clear and moist and mucous membranes are normal.  Eyes: Conjunctivae are normal.  Cardiovascular: Normal rate and regular rhythm.   Murmur (SEM II-III/VI base) heard. DP pulses present bilateral.  Respiratory: Effort normal and breath sounds normal.  No respiratory distress.  GI: There is no hepatomegaly. There is no tenderness. No hernia.  Musculoskeletal: She exhibits edema (trace pitting edema bilateral).  Hip flexion mildly limited, symmetric. No pain elicited.  Lymphadenopathy:    She has no cervical adenopathy.       Right: No inguinal (No enlarged lymph nodes but tenderness upon palpation.) adenopathy present.       Left: No inguinal adenopathy present.  I do not appreciate hernia like defects  Upon inspection or palpation on supine and standing positions.  Neurological: She is alert and oriented to person, place, and time. She has normal strength. Coordination normal.  Stable gait with a cane.  Skin: Skin is warm. No ecchymosis and no petechiae noted. Rash  is not vesicular. There is erythema.     Noted macular erythema, linear pattern, medial aspect distal RLE. Palpated cord underneath affected area, 1-2 cm x 13-15 cm length, mild tender. No induration, mild local heat, no ulcers.  Psychiatric: She has a normal mood and affect.  Well groomed, good eye contact.      ASSESSMENT AND PLAN:     Jessica [name] was seen today for follow-up.  Diagnoses and all orders for this visit:  Superficial phlebitis of leg, right  Vs cellulitis. I recommended completing 7 days of abx treatment. LE elevation. Because age and medical problems oral NSAID's are not recommended. Clearly instructed about warning signs. She voices understanding. She is supposed to have an appt coming for follow-up, recommended keeping appt.   Generalized anxiety disorder  No changes in Lexapro or Alprazolam dose. Post dated Rx for Alprazolam given. Keep f/u appt.  -     ALPRAZolam (XANAX) 0.5 MG tablet; TAKE 1/2 TO 1 TABLET BY MOUTH 1 TIME during the DAY AS NEEDED and 1/2 tab at bedtime.  Insomnia  Good sleep hygiene. Trazodone to continue, it could be increased if she is taking medication. Alprazolam 0.5 mg 1/2 tab to continue. We discussed side  effects of these mediations, which are greater at her age. Keep f/u appt.         Nalaya Wojdyla G. [name], MD  [facility]. [place] office.

## 2016-06-19 NOTE — Progress Notes (Signed)
Pre visit review using our clinic review tool, if applicable. No additional management support is needed unless otherwise documented below in the visit note. 

## 2016-06-19 NOTE — Telephone Encounter (Signed)
She filled Rx for Alprazolam 04/26/16 #90. If she is taking medication as instructed she should have medication at least until 06/27/16. She is supposed to be on Alprazolam 0.5 mg 1/2-1 tab during the day and 1/2 tab at bedtime.  Thanks, BJ

## 2016-06-19 NOTE — Patient Instructions (Addendum)
A few things to remember from today's visit:   Superficial phlebitis of leg, right  Generalized anxiety disorder - Plan: ALPRAZolam (XANAX) 0.5 MG tablet  I do not have results of labs or ultrasound done in the hospital, I think this is a superficial phlebitis. I think right groin pain is related, I did not find a hernia or skin abnormality in this area. Elevation of lower extremity.   Complete 7 days of antibiotics. Start a daily Probiotic.  Monitor for fever, chills, worsening symptoms.   -In regard to Xanax, he was supposed to be taking half to 1 tablets during the day as needed  and half tablet at night, he was supposed to still have some tablets left;so today a postdated prescription was given. You are supposed to be on trazodone at bedtime to help her sleep. Please keep next appointment.  Please be sure medication list is accurate. If a new problem present, please set up appointment sooner than planned today.

## 2016-06-23 DIAGNOSIS — H43813 Vitreous degeneration, bilateral: Secondary | ICD-10-CM | POA: Diagnosis not present

## 2016-06-23 DIAGNOSIS — H353222 Exudative age-related macular degeneration, left eye, with inactive choroidal neovascularization: Secondary | ICD-10-CM | POA: Diagnosis not present

## 2016-06-23 DIAGNOSIS — H31093 Other chorioretinal scars, bilateral: Secondary | ICD-10-CM | POA: Diagnosis not present

## 2016-06-23 DIAGNOSIS — H353123 Nonexudative age-related macular degeneration, left eye, advanced atrophic without subfoveal involvement: Secondary | ICD-10-CM | POA: Diagnosis not present

## 2016-07-03 ENCOUNTER — Ambulatory Visit (INDEPENDENT_AMBULATORY_CARE_PROVIDER_SITE_OTHER): Payer: Medicare Other | Admitting: Internal Medicine

## 2016-07-03 ENCOUNTER — Encounter: Payer: Self-pay | Admitting: Internal Medicine

## 2016-07-03 VITALS — BP 122/70 | HR 70 | Ht 63.0 in | Wt 128.0 lb

## 2016-07-03 DIAGNOSIS — J9611 Chronic respiratory failure with hypoxia: Secondary | ICD-10-CM

## 2016-07-03 DIAGNOSIS — I25111 Atherosclerotic heart disease of native coronary artery with angina pectoris with documented spasm: Secondary | ICD-10-CM

## 2016-07-03 NOTE — Patient Instructions (Signed)
Continue inhaled meds as prescribed Follow up in 6 months  Chronic Obstructive Pulmonary Disease Chronic obstructive pulmonary disease (COPD) is a common lung condition in which airflow from the lungs is limited. COPD is a general term that can be used to describe many different lung problems that limit airflow, including both chronic bronchitis and emphysema. If you have COPD, your lung function will probably never return to normal, but there are measures you can take to improve lung function and make yourself feel better. CAUSES   Smoking (common).  Exposure to secondhand smoke.  Genetic problems.  Chronic inflammatory lung diseases or recurrent infections. SYMPTOMS  Shortness of breath, especially with physical activity.  Deep, persistent (chronic) cough with a large amount of thick mucus.  Wheezing.  Rapid breaths (tachypnea).  Gray or bluish discoloration (cyanosis) of the skin, especially in your fingers, toes, or lips.  Fatigue.  Weight loss.  Frequent infections or episodes when breathing symptoms become much worse (exacerbations).  Chest tightness. DIAGNOSIS Your health care provider will take a medical history and perform a physical examination to diagnose COPD. Additional tests for COPD may include:  Lung (pulmonary) function tests.  Chest X-ray.  CT scan.  Blood tests. TREATMENT  Treatment for COPD may include:  Inhaler and nebulizer medicines. These help manage the symptoms of COPD and make your breathing more comfortable.  Supplemental oxygen. Supplemental oxygen is only helpful if you have a low oxygen level in your blood.  Exercise and physical activity. These are beneficial for nearly all people with COPD.  Lung surgery or transplant.  Nutrition therapy to gain weight, if you are underweight.  Pulmonary rehabilitation. This may involve working with a team of health care providers and specialists, such as respiratory, occupational, and physical  therapists. HOME CARE INSTRUCTIONS  Take all medicines (inhaled or pills) as directed by your health care provider.  Avoid over-the-counter medicines or cough syrups that dry up your airway (such as antihistamines) and slow down the elimination of secretions unless instructed otherwise by your health care provider.  If you are a smoker, the most important thing that you can do is stop smoking. Continuing to smoke will cause further lung damage and breathing trouble. Ask your health care provider for help with quitting smoking. He or she can direct you to community resources or hospitals that provide support.  Avoid exposure to irritants such as smoke, chemicals, and fumes that aggravate your breathing.  Use oxygen therapy and pulmonary rehabilitation if directed by your health care provider. If you require home oxygen therapy, ask your health care provider whether you should purchase a pulse oximeter to measure your oxygen level at home.  Avoid contact with individuals who have a contagious illness.  Avoid extreme temperature and humidity changes.  Eat healthy foods. Eating smaller, more frequent meals and resting before meals may help you maintain your strength.  Stay active, but balance activity with periods of rest. Exercise and physical activity will help you maintain your ability to do things you want to do.  Preventing infection and hospitalization is very important when you have COPD. Make sure to receive all the vaccines your health care provider recommends, especially the pneumococcal and influenza vaccines. Ask your health care provider whether you need a pneumonia vaccine.  Learn and use relaxation techniques to manage stress.  Learn and use controlled breathing techniques as directed by your health care provider. Controlled breathing techniques include:  Pursed lip breathing. Start by breathing in (inhaling) through your nose  for 1 second. Then, purse your lips as if you were  going to whistle and breathe out (exhale) through the pursed lips for 2 seconds.  Diaphragmatic breathing. Start by putting one hand on your abdomen just above your waist. Inhale slowly through your nose. The hand on your abdomen should move out. Then purse your lips and exhale slowly. You should be able to feel the hand on your abdomen moving in as you exhale.  Learn and use controlled coughing to clear mucus from your lungs. Controlled coughing is a series of short, progressive coughs. The steps of controlled coughing are: 1. Lean your head slightly forward. 2. Breathe in deeply using diaphragmatic breathing. 3. Try to hold your breath for 3 seconds. 4. Keep your mouth slightly open while coughing twice. 5. Spit any mucus out into a tissue. 6. Rest and repeat the steps once or twice as needed. SEEK MEDICAL CARE IF:  You are coughing up more mucus than usual.  There is a change in the color or thickness of your mucus.  Your breathing is more labored than usual.  Your breathing is faster than usual. SEEK IMMEDIATE MEDICAL CARE IF:  You have shortness of breath while you are resting.  You have shortness of breath that prevents you from:  Being able to talk.  Performing your usual physical activities.  You have chest pain lasting longer than 5 minutes.  Your skin color is more cyanotic than usual.  You measure low oxygen saturations for longer than 5 minutes with a pulse oximeter. MAKE SURE YOU:  Understand these instructions.  Will watch your condition.  Will get help right away if you are not doing well or get worse.   This information is not intended to replace advice given to you by your health care provider. Make sure you discuss any questions you have with your health care provider.   Document Released: 07/23/2005 Document Revised: 11/03/2014 Document Reviewed: 06/09/2013 Elsevier Interactive Patient Education [employer].

## 2016-07-03 NOTE — Progress Notes (Signed)
[facility] Consultation     Date: 07/03/2016,   MRN# [id number] Ruie Wolfrom [facility] 20-Jun-1926 Code Status:  Code Status History    This patient does not have a recorded code status. Please follow your organizational policy for patients in this situation.     Hosp day:@LENGTHOFSTAYDAYS @ Referring MD: @ATDPROV @     PCP:      AdmissionWeight: 128 lb (58.1 kg)                 CurrentWeight: 128 lb (58.1 kg) Former [employer] account pl[employer] Patient    CHIEF COMPLAINT:   follow up COPD   HISTORY OF PRESENT ILLNESS    82 yo white female former smoker, quit 35 years ago, has dx of COPD unable to take inhaled meds at this time,  prescribed nebulized therapy and seems to helping her breathing No sign signs of infection at this time  PFT's 01/2015 Ratio 54%, Fev1 65%, DLCO 64% TLC 101%, RV 119% Interpretation: moderate COPD with BD response with hyperinflation and air trapping with diffusion impairment  PFT 03/31/2016 Ratio 52% FEV1 72% DLCO 70% TLC 88% RV 90% Interpretation: mild obstructive disease with BD response  ONO shows hypoxia-oxygen ordered  Doing well today, no acute complaints at this time    Current Medication:   Current Outpatient Prescriptions:  .  albuterol (PROVENTIL) (2.5 MG/3ML) 0.083% nebulizer solution, Take 3 mLs (2.5 mg total) by nebulization every 4 (four) hours as needed for wheezing or shortness of breath., Disp: 75 mL, Rfl: 12 .  albuterol (VENTOLIN HFA) 108 (90 Base) MCG/ACT inhaler, Inhale 2 puffs into the lungs every 6 (six) hours as needed for wheezing or shortness of breath. Do not use if using nebs., Disp: 18 Inhaler, Rfl: 0 .  ALPRAZolam (XANAX) 0.5 MG tablet, TAKE 1/2 TO 1 TABLET BY MOUTH 1 TIME during the DAY AS NEEDED and 1/2 tab at bedtime., Disp: 45 tablet, Rfl: 2 .  arformoterol (BROVANA) 15 MCG/2ML NEBU, Take 2 mLs (15 mcg total) by nebulization 2 (two) times daily., Disp: 120 mL, Rfl: 6 .  aspirin 81 MG tablet, Take 81 mg by  mouth daily.  , Disp: , Rfl:  .  budesonide (PULMICORT) 0.5 MG/2ML nebulizer solution, Take 2 mLs (0.5 mg total) by nebulization 2 (two) times daily., Disp: 120 mL, Rfl: 6 .  Calcium Carbonate-Vitamin D (CALTRATE 600+D) 600-400 MG-UNIT per tablet, Take 1 tablet by mouth daily.  , Disp: , Rfl:  .  Cholecalciferol (VITAMIN D) 1000 UNITS capsule, Take 1,000 Units by mouth daily.  , Disp: , Rfl:  .  escitalopram (LEXAPRO) 20 MG tablet, Take 1 tablet (20 mg total) by mouth daily., Disp: 90 tablet, Rfl: 3 .  Fluticasone-Salmeterol (ADVAIR) 250-50 MCG/DOSE AEPB, Inhale 1 puff into the lungs 2 (two) times daily., Disp: 60 each, Rfl: 5 .  HYDROcodone-acetaminophen (VICODIN) 5-500 MG per tablet, Take 1 tablet by mouth every 6 (six) hours as needed for pain. , Disp: , Rfl:  .  polyethylene glycol powder (GLYCOLAX/MIRALAX) powder, Take 17 g by mouth 2 (two) times daily as needed., Disp: 3350 g, Rfl: 3 .  predniSONE (DELTASONE) 20 MG tablet, Take 1 tablet (20 mg total) by mouth daily., Disp: 7 tablet, Rfl: 0 .  simvastatin (ZOCOR) 20 MG tablet, Take 1 tablet (20 mg total) by mouth at bedtime., Disp: 90 tablet, Rfl: 3 .  traZODone (DESYREL) 50 MG tablet, Take 1 tablet (50 mg total) by mouth at bedtime., Disp: 30 tablet, Rfl: 3  ALLERGIES   Review of patient's allergies indicates no known allergies.     REVIEW OF SYSTEMS   Review of Systems  Constitutional: Negative for chills, fever, malaise/fatigue and weight loss.  HENT: Negative for congestion and hearing loss.   Respiratory: Positive for cough and sputum production. Negative for hemoptysis, shortness of breath and wheezing.        Chronic cough and chronic sputum production  Cardiovascular: Negative for chest pain, palpitations, orthopnea and leg swelling.  Gastrointestinal: Negative for heartburn and nausea.  Skin: Negative for rash.  Neurological: Negative for dizziness and headaches.  All other systems reviewed and are  negative.    VS: BP 122/70 (BP Location: Left Arm, Cuff Size: Normal)   Pulse 70   Ht 5\' 3"  (1.6 m)   Wt 128 lb (58.1 kg)   SpO2 94%   BMI 22.67 kg/m      PHYSICAL EXAM   Physical Exam  Constitutional: She is oriented to person, place, and time. No distress.  Cardiovascular: Normal rate, regular rhythm and normal heart sounds.   No murmur heard. Pulmonary/Chest: Effort normal and breath sounds normal. No stridor. No respiratory distress. She has no wheezes. She has no rales.  Abdominal: Soft.  Neurological: She is alert and oriented to person, place, and time. No cranial nerve deficit.  Skin: Skin is warm. She is not diaphoretic.  Psychiatric: She has a normal mood and affect.       ASSESSMENT/PLAN   82 yo white female with Mild/Moderate COPD Gold Stage C  With chronic hypoxic resp failure, patient very frail breathing has improved, feels better.  1.will continue Pulmicort Nebs 2.will continue LABA neb therapy with Brovana 3.albuterol Nebs every 4 hrs as needed 4.No need for abx and steroids at this time. 5.Continue oxygen at night  Follow up in 6 months    The Patient requires high complexity decision making for assessment and support, frequent evaluation and titration of therapies, application of advanced monitoring technologies and extensive interpretation of multiple databases.  Patient are satisfied with Plan of action and management. All questions answered   Corrin Parker, M.D.  [facility]  Medical Director [facility] Director [facility] Cardio-Pulmonary Department

## 2016-07-28 ENCOUNTER — Other Ambulatory Visit: Payer: Self-pay

## 2016-07-28 DIAGNOSIS — G4700 Insomnia, unspecified: Secondary | ICD-10-CM

## 2016-07-28 MED ORDER — TRAZODONE HCL 50 MG PO TABS: 50.0000 mg | ORAL_TABLET | Freq: Every day | ORAL | 0 refills | Status: DC

## 2016-08-21 DIAGNOSIS — D1801 Hemangioma of skin and subcutaneous tissue: Secondary | ICD-10-CM | POA: Diagnosis not present

## 2016-08-21 DIAGNOSIS — L02412 Cutaneous abscess of left axilla: Secondary | ICD-10-CM | POA: Diagnosis not present

## 2016-08-21 DIAGNOSIS — L814 Other melanin hyperpigmentation: Secondary | ICD-10-CM | POA: Diagnosis not present

## 2016-08-21 DIAGNOSIS — L821 Other seborrheic keratosis: Secondary | ICD-10-CM | POA: Diagnosis not present

## 2016-08-21 DIAGNOSIS — L723 Sebaceous cyst: Secondary | ICD-10-CM | POA: Diagnosis not present

## 2016-10-14 ENCOUNTER — Other Ambulatory Visit: Payer: Self-pay | Admitting: Family Medicine

## 2016-10-14 DIAGNOSIS — F411 Generalized anxiety disorder: Secondary | ICD-10-CM

## 2016-10-15 NOTE — Telephone Encounter (Signed)
Okay to refill? 

## 2016-10-22 NOTE — Telephone Encounter (Signed)
Rx faxed

## 2016-10-22 NOTE — Telephone Encounter (Signed)
Last Rx was filled 09/27/16, so she is not due yet. Rx for Alprazolam 0.25 mg can be called in to fill 10/28/2016 to continue bid as needed, no more than 45 tabs per month. # 45/0.   Also she was last seen for ER follow up, has not keep f/u appts as recommended. Las visit she was not sure if she was taking Trazodone to help with insomnia, I recommended keeping f/u appt (which was supposed to be scheduled after visit in 03/2016). I do not even see a f/u appt scheduled.  Thanks, BJ

## 2016-10-30 DIAGNOSIS — H353114 Nonexudative age-related macular degeneration, right eye, advanced atrophic with subfoveal involvement: Secondary | ICD-10-CM | POA: Diagnosis not present

## 2016-10-30 DIAGNOSIS — H43813 Vitreous degeneration, bilateral: Secondary | ICD-10-CM | POA: Diagnosis not present

## 2016-10-30 DIAGNOSIS — H353222 Exudative age-related macular degeneration, left eye, with inactive choroidal neovascularization: Secondary | ICD-10-CM | POA: Diagnosis not present

## 2016-10-30 DIAGNOSIS — H31093 Other chorioretinal scars, bilateral: Secondary | ICD-10-CM | POA: Diagnosis not present

## 2016-11-08 ENCOUNTER — Other Ambulatory Visit: Payer: Self-pay | Admitting: Family Medicine

## 2016-11-08 DIAGNOSIS — G4700 Insomnia, unspecified: Secondary | ICD-10-CM

## 2016-11-10 ENCOUNTER — Telehealth: Payer: Self-pay | Admitting: Family Medicine

## 2016-11-10 DIAGNOSIS — F411 Generalized anxiety disorder: Secondary | ICD-10-CM

## 2016-11-10 NOTE — Telephone Encounter (Signed)
Pt needs a refill on alprazolam [facility] [address]

## 2016-11-10 NOTE — Telephone Encounter (Signed)
Patient has an appointment for 1/24.

## 2016-11-11 MED ORDER — ALPRAZOLAM 0.25 MG PO TABS: ORAL_TABLET | ORAL | 0 refills | Status: DC

## 2016-11-11 NOTE — Telephone Encounter (Signed)
Rx called in 

## 2016-11-11 NOTE — Telephone Encounter (Signed)
Alprazolam 0.5 mg can be called in to continue 1/2-1 tab bid daily as needed # 45/0 (this is 30 days supply).  Thanks, BJ

## 2016-11-18 DIAGNOSIS — H52223 Regular astigmatism, bilateral: Secondary | ICD-10-CM | POA: Diagnosis not present

## 2016-11-18 DIAGNOSIS — H524 Presbyopia: Secondary | ICD-10-CM | POA: Diagnosis not present

## 2016-11-18 DIAGNOSIS — H353112 Nonexudative age-related macular degeneration, right eye, intermediate dry stage: Secondary | ICD-10-CM | POA: Diagnosis not present

## 2016-11-18 DIAGNOSIS — H5213 Myopia, bilateral: Secondary | ICD-10-CM | POA: Diagnosis not present

## 2016-11-18 DIAGNOSIS — H26493 Other secondary cataract, bilateral: Secondary | ICD-10-CM | POA: Diagnosis not present

## 2016-11-18 DIAGNOSIS — B394 Histoplasmosis capsulati, unspecified: Secondary | ICD-10-CM | POA: Diagnosis not present

## 2016-11-18 DIAGNOSIS — H353222 Exudative age-related macular degeneration, left eye, with inactive choroidal neovascularization: Secondary | ICD-10-CM | POA: Diagnosis not present

## 2016-11-19 ENCOUNTER — Encounter: Payer: Self-pay | Admitting: Family Medicine

## 2016-11-19 ENCOUNTER — Ambulatory Visit (INDEPENDENT_AMBULATORY_CARE_PROVIDER_SITE_OTHER): Payer: Medicare Other | Admitting: Family Medicine

## 2016-11-19 VITALS — BP 140/88 | HR 93 | Resp 12 | Ht 63.0 in | Wt 122.1 lb

## 2016-11-19 DIAGNOSIS — G4700 Insomnia, unspecified: Secondary | ICD-10-CM | POA: Diagnosis not present

## 2016-11-19 DIAGNOSIS — F411 Generalized anxiety disorder: Secondary | ICD-10-CM

## 2016-11-19 DIAGNOSIS — Z23 Encounter for immunization: Secondary | ICD-10-CM | POA: Diagnosis not present

## 2016-11-19 DIAGNOSIS — R251 Tremor, unspecified: Secondary | ICD-10-CM

## 2016-11-19 MED ORDER — ALPRAZOLAM 0.25 MG PO TABS: ORAL_TABLET | ORAL | 3 refills | Status: DC

## 2016-11-19 MED ORDER — ESCITALOPRAM OXALATE 20 MG PO TABS: 20.0000 mg | ORAL_TABLET | Freq: Every day | ORAL | 2 refills | Status: DC

## 2016-11-19 MED ORDER — TRAZODONE HCL 50 MG PO TABS: 50.0000 mg | ORAL_TABLET | Freq: Every day | ORAL | 1 refills | Status: DC

## 2016-11-19 NOTE — Patient Instructions (Signed)
A few things to remember from today's visit:   Insomnia, unspecified type - Plan: traZODone (DESYREL) 50 MG tablet  Generalized anxiety disorder - Plan: ALPRAZolam (XANAX) 0.25 MG tablet  No changes today. Fall precautions.    Please be sure medication list is accurate. If a new problem present, please set up appointment sooner than planned today.

## 2016-11-19 NOTE — Progress Notes (Signed)
HPI:   [name] is a 83 y.o. female, who is here today with to follow on some of her chronic medical problems. She has not had a fall since her last OV. Yesterday she had her eye exam. Hx of macular degeneration left eye,she had intra ocular injections in the past. Problem reported as stable.  Insomnia:  She started with Trazodone 50 mg at bedtime in 03/2016. She takes it with Xanax  0.25 mg. She is sleeping better, denies side effects. She is sleeping about 7 hours.  Anxiety: She has been on Lexapro 20 mg for years. She is tolerating well, denies depressed mood or suicidal thoughts. Takes Alprazolam 0.25 mg bid as needed.   She has Hx of COPD (Gold stage C), follows with pul monologists (Dr Albertine Grates). PAD,HLD, and CAD, she follows with cardiologists, Dr [name]. She has an appt Monday 11/24/16.  Hx of OA and fibromyalgia. Knee OA, she is following with ortho as needed for intra articular steroid injections.  She denies falls since her last OV. It has ben a concerned about memory during prior OV's. 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   Concerns today: Daughter mentions mild tremor, she has noted in the past few months. Usually noted when she is trying to reach something with her left hand. She is right handed,she has not noted weakness. She denies palpitations, worsening anxiety,heat/cold intolerance, numbness or tingling. Tremor is not interfering with writing,eating ,or other daily activities.  She has not noted changes in gait, she uses a cane    Review of Systems  Constitutional: Negative for appetite change, fatigue, fever and unexpected weight change.  HENT: Negative for mouth sores, nosebleeds, trouble swallowing and voice change.   Eyes: Negative for redness and visual disturbance.  Respiratory: Positive for cough (occasional). Negative for shortness of breath and wheezing.     Cardiovascular: Negative for chest pain, palpitations and leg swelling.  Gastrointestinal: Negative for abdominal pain, nausea and vomiting.       Negative for changes in bowel habits.  Endocrine: Negative for cold intolerance and heat intolerance.  Genitourinary: Negative for decreased urine volume, difficulty urinating and hematuria.  Musculoskeletal: Positive for arthralgias and gait problem.  Skin: Negative for rash.  Neurological: Positive for tremors. Negative for syncope, facial asymmetry, speech difficulty, weakness, numbness and headaches.  Psychiatric/Behavioral: Positive for sleep disturbance. Negative for confusion, hallucinations and suicidal ideas. The patient is nervous/anxious.       Current Outpatient Prescriptions on File Prior to Visit  Medication Sig Dispense Refill  . albuterol (PROVENTIL) (2.5 MG/3ML) 0.083% nebulizer solution Take 3 mLs (2.5 mg total) by nebulization every 4 (four) hours as needed for wheezing or shortness of breath. 75 mL 12  . albuterol (VENTOLIN HFA) 108 (90 Base) MCG/ACT inhaler Inhale 2 puffs into the lungs every 6 (six) hours as needed for wheezing or shortness of breath. Do not use if using nebs. 18 Inhaler 0  . arformoterol (BROVANA) 15 MCG/2ML NEBU Take 2 mLs (15 mcg total) by nebulization 2 (two) times daily. 120 mL 6  . aspirin 81 MG tablet Take 81 mg by mouth daily.      . budesonide (PULMICORT) 0.5 MG/2ML nebulizer solution Take 2 mLs (0.5 mg total) by nebulization 2 (two) times daily. 120 mL 6  . Calcium Carbonate-Vitamin D (CALTRATE 600+D) 600-400 MG-UNIT per tablet Take 1 tablet by mouth daily.      Marland Kitchen  Cholecalciferol (VITAMIN D) 1000 UNITS capsule Take 1,000 Units by mouth daily.      . Fluticasone-Salmeterol (ADVAIR) 250-50 MCG/DOSE AEPB Inhale 1 puff into the lungs 2 (two) times daily. 60 each 5  . polyethylene glycol powder (GLYCOLAX/MIRALAX) powder Take 17 g by mouth 2 (two) times daily as needed. 3350 g 3  . simvastatin (ZOCOR) 20  MG tablet Take 1 tablet (20 mg total) by mouth at bedtime. 90 tablet 3   No current facility-administered medications on file prior to visit.      Past Medical History:  Diagnosis Date  . Acute asthmatic bronchitis   . Anxiety   . Atypical chest pain   . congenital nystagmus   . DJD (degenerative joint disease)   . Fibromyalgia   . Low back pain syndrome   . Memory loss   . Other and unspecified hyperlipidemia   . Venous insufficiency    No Known Allergies  Social History   Social History  . Marital status: Widowed    Spouse name: N/A  . Number of children: 3  . Years of education: N/A   Occupational History  .  Retired   Social History Main Topics  . Smoking status: Former Smoker    Packs/day: 2.00    Years: 30.00    Types: Cigarettes    Quit date: 10/28/1991  . Smokeless tobacco: Never Used  . Alcohol use No  . Drug use: No  . Sexual activity: Not Asked   Other Topics Concern  . None   Social History Narrative   1 sibling alive age 29   1 sibling alive age 72   1 sibling deceased age 27   1 sibling alive age 5    Vitals:   11/19/16 0940  BP: 140/88  Pulse: 93  Resp: 12   Body mass index is 21.63 kg/m.   Physical Exam  Nursing note and vitals reviewed. Constitutional: She is oriented to person, place, and time. She appears well-developed and well-nourished. No distress.  HENT:  Head: Atraumatic.  Mouth/Throat: Oropharynx is clear and moist and mucous membranes are normal.  Eyes: Conjunctivae and EOM are normal.  Neck: No thyroid mass and no thyromegaly present.  Cardiovascular: Normal rate and regular rhythm.   Murmur (SEM II/VI RUSB mainly) heard. Varicose veins bilateral. DP pulses present bilateral.  Respiratory: Effort normal and breath sounds normal. No respiratory distress.  GI: She exhibits no mass. There is no hepatomegaly. There is no tenderness.  Musculoskeletal: She exhibits no edema or tenderness.  Lymphadenopathy:    She has  no cervical adenopathy.  Neurological: She is alert and oriented to person, place, and time. No cranial nerve deficit. Coordination normal.  Minimal left>rigth hand tremor noted when she extends arms forward, it lasted a few seconds. Stable gait with a cane.  Skin: Skin is warm. No erythema.  Psychiatric: She has a normal mood and affect.  Well groomed, good eye contact.      ASSESSMENT AND PLAN:   Yori was seen today for follow-up.  Diagnoses and all orders for this visit:   Insomnia, unspecified type  Well controlled. Good sleep hygiene. We discussed some side effects of Trazodone and risk of interaction with Lexapro. No changes in current management. F/U in 6 months.  -     traZODone (DESYREL) 50 MG tablet; Take 1 tablet (50 mg total) by mouth at bedtime.  Tremor Possible causes discussed: Medications side effects, anxiety, and essential tremor among some. Today examination  does not suggest a serious problems, she has mild tremor with intention. For now she agrees with monitoring, no further work up recommended today. Fall precautions.   Lab Results  Component Value Date   TSH 1.67 01/18/2014    Generalized anxiety disorder  Stable. No changes in current management. Planning on trying to decrease Lexapro next OV. We discussed some side effects of medications.  -     escitalopram (LEXAPRO) 20 MG tablet; Take 1 tablet (20 mg total) by mouth daily. -     ALPRAZolam (XANAX) 0.25 MG tablet; Take 1/2-1 tablet by mouth twice a day as needed.  Need for prophylactic vaccination and inoculation against influenza -     Flu vaccine HIGH DOSE PF     -Ms. Osie [place] Chapdelaine was advised to return sooner than planned today if new concerns arise.       Jacier Gladu G. [name], MD  [facility]. [place] office.

## 2016-11-19 NOTE — Progress Notes (Signed)
Pre visit review using our clinic review tool, if applicable. No additional management support is needed unless otherwise documented below in the visit note. 

## 2016-11-24 ENCOUNTER — Encounter: Payer: Self-pay | Admitting: Cardiovascular Disease

## 2016-11-24 ENCOUNTER — Ambulatory Visit (INDEPENDENT_AMBULATORY_CARE_PROVIDER_SITE_OTHER): Payer: Medicare Other | Admitting: Cardiovascular Disease

## 2016-11-24 VITALS — BP 146/70 | HR 70 | Ht 64.0 in | Wt 123.0 lb

## 2016-11-24 DIAGNOSIS — E782 Mixed hyperlipidemia: Secondary | ICD-10-CM

## 2016-11-24 DIAGNOSIS — J441 Chronic obstructive pulmonary disease with (acute) exacerbation: Secondary | ICD-10-CM

## 2016-11-24 DIAGNOSIS — R413 Other amnesia: Secondary | ICD-10-CM | POA: Diagnosis not present

## 2016-11-24 DIAGNOSIS — I350 Nonrheumatic aortic (valve) stenosis: Secondary | ICD-10-CM

## 2016-11-24 DIAGNOSIS — R0602 Shortness of breath: Secondary | ICD-10-CM

## 2016-11-24 DIAGNOSIS — I209 Angina pectoris, unspecified: Secondary | ICD-10-CM

## 2016-11-24 DIAGNOSIS — R05 Cough: Secondary | ICD-10-CM

## 2016-11-24 DIAGNOSIS — R053 Chronic cough: Secondary | ICD-10-CM

## 2016-11-24 DIAGNOSIS — I25111 Atherosclerotic heart disease of native coronary artery with angina pectoris with documented spasm: Secondary | ICD-10-CM

## 2016-11-24 NOTE — Patient Instructions (Signed)

## 2016-11-24 NOTE — Progress Notes (Addendum)
Cardiology Office Note  Date:  11/24/2016   ID:  Jessica [name], DOB Feb 29, 1926, MRN [id number]  PCP:  Jessica [name], MD   Chief Complaint  Patient presents with  . other    6 month f/u c/o fatigue. Meds reviewed verbally with pt.    HPI:  Jessica [name]  is an 83 y/o woman with mild to moderate aortic valve stenosis, h/o HL, fibromyalgia, anxiety and moderate COPD/emphysema, asthmatic bronchitis Previous smoking history but stopped over 30 years ago. History of total knee replacement July 2013 by her report Mild gait instability, walks with a cane. Previous history of falls with trauma to her face/forehead requiring stitches. No recent falls She presents for follow-up of her mild to moderate aortic valve disease(by echo 01/2015)  and shortness of breath Macular degeneration  Presents today with family Still working at [employer] , does not want to retire   she continues to work on Scientist, research (life sciences) a neb treatment, breathing better, spitting up, thick "stuff" Denies any chest pain on exertion Pulmonary, Dr. Mortimer Fries,   significant exposure to secondhand smoke from her daughter, presents in the exam room today and smells like smoke.   Previous fall Valentine's Day 2017, fractured her right clavicle ( aortic calcification seen on chest x-ray  At that time)  no surgery was performed  difficulty with her memory per the family    said details of her story over and over on her visit today   EKG on today's visit shows normal sinus rhythm with rate 70 bpm, no significant ST or T-wave changes    other past medical history reviewed Echocardiogram April 2016 showing mild to moderate aortic valve stenosis Carotid ultrasound November 2015 showing mild bilateral carotid disease  Previous trip to the ER with CP in 8/11.  Had post hospital Myoview in 9/11 with normal EF and question of inferior ischemia.   medical therapy pursued at that time. Asymptomatic since then  No  recurrent CP or undue dyspnea. Does get fatigued. No palpitations, CHF or syncope.      PMH:   has a past medical history of Acute asthmatic bronchitis; Anxiety; Atypical chest pain; congenital nystagmus; DJD (degenerative joint disease); Fibromyalgia; Low back pain syndrome; Memory loss; Other and unspecified hyperlipidemia; and Venous insufficiency.  PSH:    Past Surgical History:  Procedure Laterality Date  . ABDOMINAL HYSTERECTOMY    . ANTERIOR CERVICAL DISCECTOMY    . CARPAL TUNNEL RELEASE  12/2011   right arm  . CATARACT EXTRACTION    . LUMBAR LAMINECTOMY    . right knee arthroscopy    . right shoulder replacement  04/2009  . right shoulder surgery  2008   Dr. Noemi Chapel    Current Outpatient Prescriptions  Medication Sig Dispense Refill  . albuterol (PROVENTIL) (2.5 MG/3ML) 0.083% nebulizer solution Take 3 mLs (2.5 mg total) by nebulization every 4 (four) hours as needed for wheezing or shortness of breath. 75 mL 12  . albuterol (VENTOLIN HFA) 108 (90 Base) MCG/ACT inhaler Inhale 2 puffs into the lungs every 6 (six) hours as needed for wheezing or shortness of breath. Do not use if using nebs. 18 Inhaler 0  . ALPRAZolam (XANAX) 0.25 MG tablet Take 1/2-1 tablet by mouth twice a day as needed. 45 tablet 3  . arformoterol (BROVANA) 15 MCG/2ML NEBU Take 2 mLs (15 mcg total) by nebulization 2 (two) times daily. 120 mL 6  . aspirin 81 MG tablet Take 81 mg by mouth daily.      Marland Kitchen  budesonide (PULMICORT) 0.5 MG/2ML nebulizer solution Take 2 mLs (0.5 mg total) by nebulization 2 (two) times daily. 120 mL 6  . Calcium Carbonate-Vitamin D (CALTRATE 600+D) 600-400 MG-UNIT per tablet Take 1 tablet by mouth daily.      . Cholecalciferol (VITAMIN D) 1000 UNITS capsule Take 1,000 Units by mouth daily.      Marland Kitchen escitalopram (LEXAPRO) 20 MG tablet Take 1 tablet (20 mg total) by mouth daily. 90 tablet 2  . simvastatin (ZOCOR) 20 MG tablet Take 1 tablet (20 mg total) by mouth at bedtime. 90 tablet 3  .  traZODone (DESYREL) 50 MG tablet Take 1 tablet (50 mg total) by mouth at bedtime. 90 tablet 1   No current facility-administered medications for this visit.      Allergies:   Patient has no known allergies.   Social History:  The patient  reports that she quit smoking about 25 years ago. Her smoking use included Cigarettes. She has a 60.00 pack-year smoking history. She has never used smokeless tobacco. She reports that she drinks alcohol. She reports that she does not use drugs.   Family History:   Family history is unknown by patient.    Review of Systems: Review of Systems  Constitutional: Negative.   Respiratory: Positive for cough and shortness of breath.   Cardiovascular: Negative.   Gastrointestinal: Negative.   Musculoskeletal: Negative.   Neurological: Negative.   Psychiatric/Behavioral: Negative.   All other systems reviewed and are negative.    PHYSICAL EXAM: VS:  BP (!) 146/70 (BP Location: Left Arm, Patient Position: Sitting, Cuff Size: Normal)   Pulse 70   Ht 5\' 4"  (1.626 m)   Wt 123 lb (55.8 kg)   BMI 21.11 kg/m  , BMI Body mass index is 21.11 kg/m. GEN: Well nourished, well developed, in no acute distress  HEENT: normal  Neck: no JVD, + carotid bruits, no masses Cardiac: RRR; 2/6 SEM RSB with bruit radiating up into the carotids,  no rubs, or gallops,no edema  Respiratory:   Clear, mildly decreased breath sounds throughout,normal work of breathing GI: soft, nontender, nondistended, + BS MS: no deformity or atrophy  Skin: warm and dry, no rash Neuro:  Strength and sensation are intact Psych: euthymic mood, full affect    Recent Labs: 03/11/2016: ALT 10; BUN 13; Creatinine, Ser 0.71; Sodium 141 04/08/2016: Potassium 3.8    Lipid Panel Lab Results  Component Value Date   CHOL 133 03/11/2016   HDL 53.70 03/11/2016   LDLCALC 67 03/11/2016   TRIG 59.0 03/11/2016      Wt Readings from Last 3 Encounters:  11/24/16 123 lb (55.8 kg)  11/19/16 122  lb 2 oz (55.4 kg)  07/03/16 128 lb (58.1 kg)       ASSESSMENT AND PLAN:  Atherosclerosis of native coronary artery of native heart with angina pectoris with documented spasm ([place]) - Plan: EKG 12-Lead Currently with no symptoms of angina. No further workup at this time. Continue current medication regimen.  Mixed hyperlipidemia Cholesterol is at goal on the current lipid regimen. No changes to the medications were made.  Nonrheumatic aortic valve stenosis Aortic valve stenosis, mild to moderate in April 2016 Findings discussed with her, no need for echocardiogram at this time Asymptomatic  Memory loss Symptoms seem stable, mild on today's visit  Chronic cough Chronically spitting up thick dark-colored mucus Again recommended to family she minimize smoke exposure Room smells like smoke today with family present  COPD exacerbation ([place]) No recent  exacerbation of her COPD, Symptoms improved with nebulizers  Shortness of breath She reports shortness of breath is better with nebulizer treatments which she does every day Discussed with family in the room with her today, recommended she try to minimize smoke exposure   Total encounter time more than 25 minutes  Greater than 50% was spent in counseling and coordination of care with the patient   Disposition:   F/U  6 months   Orders Placed This Encounter  Procedures  . EKG 12-Lead     Signed, Esmond Plants, M.D., Ph.D. 11/24/2016  [place], [place]

## 2016-12-05 DIAGNOSIS — S93601A Unspecified sprain of right foot, initial encounter: Secondary | ICD-10-CM | POA: Diagnosis not present

## 2016-12-20 ENCOUNTER — Other Ambulatory Visit: Payer: Self-pay | Admitting: Family Medicine

## 2016-12-20 DIAGNOSIS — F411 Generalized anxiety disorder: Secondary | ICD-10-CM

## 2016-12-22 DIAGNOSIS — M542 Cervicalgia: Secondary | ICD-10-CM | POA: Diagnosis not present

## 2016-12-22 NOTE — Telephone Encounter (Signed)
Called and spoke with pharmacy. There is an Rx on hold from January that they will fill for patient.

## 2016-12-22 NOTE — Telephone Encounter (Signed)
Last OV, 11/19/16, I gave them a Rx for Alprazolam with 3 refills. According to [place] controlled substance reporting system last Rx was filled 11/11/16. Please clarify this with pt or her daughter.  Thanks, BJ

## 2017-01-17 ENCOUNTER — Other Ambulatory Visit: Payer: Self-pay | Admitting: Family Medicine

## 2017-01-17 DIAGNOSIS — F411 Generalized anxiety disorder: Secondary | ICD-10-CM

## 2017-01-19 NOTE — Telephone Encounter (Signed)
It seems like last Rx was given 11/2016 with 3 refills.So she should be fine.  Thanks, BJ

## 2017-01-19 NOTE — Telephone Encounter (Signed)
This is too soon right? We called in #45 with 3 refills on 1/24

## 2017-01-29 ENCOUNTER — Other Ambulatory Visit: Payer: Self-pay | Admitting: Family Medicine

## 2017-01-29 DIAGNOSIS — F411 Generalized anxiety disorder: Secondary | ICD-10-CM

## 2017-01-29 NOTE — Telephone Encounter (Signed)
Rx called in 

## 2017-01-29 NOTE — Telephone Encounter (Signed)
Rx for Alprazolam 0.25 mg can be called in to continue 1/2-1 tab bid as needed, no more than 45 tabs in 30 days. #45/2. Please specify to pharmacists this is a 30 days Rx [they are reporting it as 22 days Rx in the [place] controlled system].  Thanks, BJ

## 2017-02-06 ENCOUNTER — Encounter: Payer: Self-pay | Admitting: Family Medicine

## 2017-02-06 ENCOUNTER — Ambulatory Visit (INDEPENDENT_AMBULATORY_CARE_PROVIDER_SITE_OTHER): Payer: Medicare Other | Admitting: Family Medicine

## 2017-02-06 VITALS — BP 118/80 | HR 83 | Resp 12 | Ht 64.0 in | Wt 118.5 lb

## 2017-02-06 DIAGNOSIS — J432 Centrilobular emphysema: Secondary | ICD-10-CM | POA: Diagnosis not present

## 2017-02-06 DIAGNOSIS — I25111 Atherosclerotic heart disease of native coronary artery with angina pectoris with documented spasm: Secondary | ICD-10-CM | POA: Diagnosis not present

## 2017-02-06 DIAGNOSIS — K5900 Constipation, unspecified: Secondary | ICD-10-CM | POA: Diagnosis not present

## 2017-02-06 DIAGNOSIS — R5383 Other fatigue: Secondary | ICD-10-CM | POA: Diagnosis not present

## 2017-02-06 DIAGNOSIS — K625 Hemorrhage of anus and rectum: Secondary | ICD-10-CM | POA: Diagnosis not present

## 2017-02-06 DIAGNOSIS — R638 Other symptoms and signs concerning food and fluid intake: Secondary | ICD-10-CM | POA: Diagnosis not present

## 2017-02-06 LAB — BASIC METABOLIC PANEL
BUN: 8 mg/dL (ref 6–23)
CO2: 32 meq/L (ref 19–32)
Calcium: 9.5 mg/dL (ref 8.4–10.5)
Chloride: 101 mEq/L (ref 96–112)
Creatinine, Ser: 0.74 mg/dL (ref 0.40–1.20)
GFR: 78.53 mL/min (ref >60.00)
GLUCOSE: 77 mg/dL (ref 70–99)
POTASSIUM: 4.2 meq/L (ref 3.5–5.1)
SODIUM: 141 meq/L (ref 135–145)

## 2017-02-06 LAB — CBC WITH DIFFERENTIAL/PLATELET
Basophils Absolute: 0 10*3/uL (ref 0.0–0.1)
Basophils Relative: 0.6 % (ref 0.0–3.0)
EOS PCT: 0.4 % (ref 0.0–5.0)
Eosinophils Absolute: 0 10*3/uL (ref 0.0–0.7)
HCT: 41.7 % (ref 36.0–46.0)
HEMOGLOBIN: 13.6 g/dL (ref 12.0–15.0)
LYMPHS ABS: 1 10*3/uL (ref 0.7–4.0)
Lymphocytes Relative: 14.9 % (ref 12.0–46.0)
MCHC: 32.5 g/dL (ref 30.0–36.0)
MCV: 93.3 fl (ref 78.0–100.0)
MONOS PCT: 7.1 % (ref 3.0–12.0)
Monocytes Absolute: 0.5 10*3/uL (ref 0.1–1.0)
NEUTROS PCT: 77 % (ref 43.0–77.0)
Neutro Abs: 5.1 10*3/uL (ref 1.4–7.7)
Platelets: 312 10*3/uL (ref 150.0–400.0)
RBC: 4.46 Mil/uL (ref 3.87–5.11)
RDW: 15.1 % (ref 11.5–15.5)
WBC: 6.6 10*3/uL (ref 4.0–10.5)

## 2017-02-06 NOTE — Progress Notes (Signed)
HPI:   ACUTE VISIT:  Chief Complaint  Patient presents with  . very weak    Jessica [name] is a 83 y.o. female, who is here today with her daughter complaining of worsening "weakness."  According to daughter for a while she has had fatigue and feeling tired, "a little" but seems to be worse after she had dental procedure.   3 weeks ago she had dental extractions and replaced for dentures. She could not chew due to pain, still having trouble despite of dentures,daughter is blending her food, given her Ensure, and apple sauce.  "Slowed down in doing things", sitting most of the time,no falls.She uses a cane to help with balance. Denies MS changes,fever, chills, nausea,vomiting, or focal weakness.  Hx of COPD, according to daughter, she is having "a little" non productive cough and exertional dyspnea, both chronic. She missed appt with pulmonologist and has missed some neb treatments.  No sick contact.  -Last week she had 2-3 days of blood in toilet after defecation. According to pt,it was "not much" and attributed to straining and bulky stool day before symptoms started. Denies dyschezia. She had mild lower abdominal cramp, resolved after defecation. + Flatus, denies abdominal distension. Last bowel movement this morning.  She denies urinary symptoms.  Hx of anxiety,she is on Lexapro and Alprazolam.She is taking medication as instructed.   Review of Systems  Constitutional: Positive for activity change, appetite change and fatigue. Negative for chills and fever.  HENT: Positive for dental problem. Negative for facial swelling, mouth sores, nosebleeds, sore throat and trouble swallowing.   Eyes: Negative for redness and visual disturbance.  Respiratory: Positive for cough and shortness of breath. Negative for wheezing and stridor.   Cardiovascular: Negative for chest pain, palpitations and leg swelling.  Gastrointestinal: Positive for anal bleeding and constipation.  Negative for nausea and vomiting.  Endocrine: Negative for cold intolerance and heat intolerance.  Genitourinary: Negative for decreased urine volume, dysuria, vaginal bleeding and vaginal discharge.  Musculoskeletal: Positive for arthralgias (Hx of OA and fibromyalgia) and gait problem.  Skin: Negative for pallor and rash.  Allergic/Immunologic: Positive for environmental allergies.  Neurological: Negative for syncope, weakness and headaches.  Psychiatric/Behavioral: Positive for sleep disturbance (On Trazodone, stable). Negative for confusion and hallucinations. The patient is nervous/anxious.        Current Outpatient Prescriptions on File Prior to Visit  Medication Sig Dispense Refill  . albuterol (PROVENTIL) (2.5 MG/3ML) 0.083% nebulizer solution Take 3 mLs (2.5 mg total) by nebulization every 4 (four) hours as needed for wheezing or shortness of breath. 75 mL 12  . albuterol (VENTOLIN HFA) 108 (90 Base) MCG/ACT inhaler Inhale 2 puffs into the lungs every 6 (six) hours as needed for wheezing or shortness of breath. Do not use if using nebs. 18 Inhaler 0  . ALPRAZolam (XANAX) 0.25 MG tablet TAKE 1/2 - 1 TABS BY MOUTH TWICE A DAY AS NEEDED 45 tablet 2  . arformoterol (BROVANA) 15 MCG/2ML NEBU Take 2 mLs (15 mcg total) by nebulization 2 (two) times daily. 120 mL 6  . aspirin 81 MG tablet Take 81 mg by mouth daily.      . budesonide (PULMICORT) 0.5 MG/2ML nebulizer solution Take 2 mLs (0.5 mg total) by nebulization 2 (two) times daily. 120 mL 6  . Calcium Carbonate-Vitamin D (CALTRATE 600+D) 600-400 MG-UNIT per tablet Take 1 tablet by mouth daily.      . Cholecalciferol (VITAMIN D) 1000 UNITS capsule Take 1,000 Units by  mouth daily.      Marland Kitchen escitalopram (LEXAPRO) 20 MG tablet Take 1 tablet (20 mg total) by mouth daily. 90 tablet 2  . simvastatin (ZOCOR) 20 MG tablet Take 1 tablet (20 mg total) by mouth at bedtime. 90 tablet 3  . traZODone (DESYREL) 50 MG tablet Take 1 tablet (50 mg total) by  mouth at bedtime. 90 tablet 1   No current facility-administered medications on file prior to visit.      Past Medical History:  Diagnosis Date  . Acute asthmatic bronchitis   . Anxiety   . Atypical chest pain   . congenital nystagmus   . DJD (degenerative joint disease)   . Fibromyalgia   . Low back pain syndrome   . Memory loss   . Other and unspecified hyperlipidemia   . Venous insufficiency    No Known Allergies  Social History   Social History  . Marital status: Widowed    Spouse name: N/A  . Number of children: 3  . Years of education: N/A   Occupational History  .  Retired   Social History Main Topics  . Smoking status: Former Smoker    Packs/day: 2.00    Years: 30.00    Types: Cigarettes    Quit date: 10/28/1991  . Smokeless tobacco: Never Used  . Alcohol use Yes  . Drug use: No  . Sexual activity: Not Asked   Other Topics Concern  . None   Social History Narrative   1 sibling alive age 92   1 sibling alive age 16   1 sibling deceased age 29   1 sibling alive age 93    Vitals:   02/06/17 0907  BP: 118/80  Pulse: 83  Resp: 12  O2 sat 94% at RA Body mass index is 20.34 kg/m.   Wt Readings from Last 3 Encounters:  02/06/17 118 lb 8 oz (53.8 kg)  11/24/16 123 lb (55.8 kg)  11/19/16 122 lb 2 oz (55.4 kg)    Physical Exam  Nursing note and vitals reviewed. Constitutional: She is oriented to person, place, and time. She appears well-developed and well-nourished. She does not appear ill. No distress.  HENT:  Head: Atraumatic.  Mouth/Throat: Oropharynx is clear and moist and mucous membranes are normal.  Eyes: Conjunctivae and EOM are normal.  Neck: No JVD present.  Cardiovascular: Normal rate and regular rhythm.   Murmur (SEM II/VI RUSB and LUSB) heard. DP pulses present bilateral.  Respiratory: Effort normal and breath sounds normal. No respiratory distress.  GI: She exhibits no mass. Bowel sounds are increased. There is no hepatomegaly.  There is no tenderness. There is no CVA tenderness.  Genitourinary:  Genitourinary Comments: Refused rectal exam.  Musculoskeletal: She exhibits no edema or tenderness.  Lymphadenopathy:    She has no cervical adenopathy.  Neurological: She is alert and oriented to person, place, and time.  No focal deficit appreciated. Stable gait assisted with a cane.  Skin: Skin is warm. No rash noted. No erythema.  Psychiatric: Her mood appears anxious.  Well groomed, good eye contact.      ASSESSMENT AND PLAN:   Letti was seen today for very weak.  Diagnoses and all orders for this visit:  Fatigue, unspecified type  Hx of chronic fatigue. We dicussed possible causes that can aggravate problem: Recent procedure,decreased oral intake, depression,medication side effects, and acute illness among some.  Constipation, unspecified constipation type  Hx of constipation. Increase fiber intake,adequate hydration. Miralax and Bisacodyl daily  as needed,some side effects dicussed.  Instructed about warning signs.  -     Basic metabolic panel  Rectal bleed  Resolved. We dicussed possible etiologies, ? Internal hemorrhoids. Refused rectal exam. Avoid straining, constipation,or long toilet time. Daughter and pt do not feel like lab work is necessary at this time. They both voice understanding of warning signs.  -     Basic metabolic panel -     CBC with Differential/Platelet  Centrilobular emphysema ([place])  Resume treatment with Brovana and Albuterol neb as recommended by her pulmonologist. Her daughter is planning on re-scheduling appt. She is not interested in lung imaging at this time. Instructed about warning signs.  Decreased oral intake  S/P dental extraction and new dentures. She has lost about 5 lb sine last OV 05/2016. Continue soft diet and advance as tolerated. Follow with dentists as planned.     -Ms.Corrie Reder Brinson and her daughter were advised to return or notify a  doctor immediately if symptoms worsen or persist or new concerns arise.       [name], MD  [facility]. [place] office.

## 2017-02-06 NOTE — Progress Notes (Signed)
Pre visit review using our clinic review tool, if applicable. No additional management support is needed unless otherwise documented below in the visit note. 

## 2017-02-06 NOTE — Patient Instructions (Addendum)
  A few things to remember from today's visit:   Constipation, unspecified constipation type - Plan: Basic metabolic panel  Rectal bleed - Plan: Basic metabolic panel, CBC with Differential/Platelet  Fatigue, unspecified type  Monitor for new symptoms. If rectal bleeding re-curs please let me know asap or go to ER.  Fall precautions.  Soft diet,fiber,adequate hydration. Miralax and if needed Bisacodyl  Avoid straining.   Please be sure medication list is accurate. If a new problem present, please set up appointment sooner than planned today.          WE NOW OFFER   Jessica [name][facility] FAST TRACK!!!  SAME DAY Appointments for ACUTE CARE  Such as: Sprains, Injuries, cuts, abrasions, rashes, muscle pain, joint pain, back pain Colds, flu, sore throats, headache, allergies, cough, fever  Ear pain, sinus and eye infections Abdominal pain, nausea, vomiting, diarrhea, upset stomach Animal/insect bites  3 Easy Ways to Schedule: Walk-In Scheduling Call in scheduling Mychart Sign-up: [email or web address]

## 2017-03-11 ENCOUNTER — Telehealth: Payer: Self-pay | Admitting: Internal Medicine

## 2017-03-11 NOTE — Telephone Encounter (Signed)
Jessica [name] with [name] is calling in regards to an oxygen re-certification. She was wondering if we recieved a letter for medical necessity. Request was sent over on May 7th. Please advise.

## 2017-03-11 NOTE — Telephone Encounter (Signed)
LMOVM for Sharl Ma letting her know that we haven't seen pt since 06/2016 and that we sent out a letter in 11/2016 informing pt to call to schedule f/u and no appt has been scheduled. Informed nothing can be filled out until pt is seen and to call back with any further questions.

## 2017-05-18 ENCOUNTER — Ambulatory Visit: Payer: Medicare Other | Admitting: Family Medicine

## 2017-05-26 NOTE — Progress Notes (Signed)
HPI:   [name] is a 83 y.o. female, who is here today with her daughter to follow on some chronic medical problems.  I saw her last on 02/06/2017, when she was complaining of worsening fatigue after dental extractions.  Anxiety, currently she is on Lexapro 20 mg daily and Alprazolam 0.25 mg bid as needed. 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 Denies suicidal thoughts.  Insomnia, she is on Trazodone 50 mg daily. Sleeping about 7-8 hours. Denies side effects of medication.   Exertional dyspnea and cough stable. Productive cough with clear sputum, denies hemoptysis. + Worsening fatigue and endurance, she gets tire easier.  COPD, she is following with pulmonologist: Dr. Mortimer Fries. She also follows with cardiologists, Dr. [name] (11/24/16)   She finally got her dentures, so eating better. Dentist have recommended planning on pulling all bottom teeth.  HLD: Currently she is on Zocor 20 mg daily. She follows a low fat diet. Tolerating medication well.  Hx of CAD and PAD. She is also on Aspirin 81 mg.  Last FLP 02/2016: TC 133, TG 59,LDL 67, and HDL 53.  Noted erythematous area and skin tear on left forearm. About a week ago one of her dogs jumped on her and scratched skin with pows. She cleaned area with soap and water.  She has applied OTC Neosporin and lesions seems to be improving. Tdap 2014.   Review of Systems  Constitutional: Positive for activity change and fatigue. Negative for appetite change, diaphoresis and fever.  HENT: Negative for mouth sores, nosebleeds and trouble swallowing.   Eyes: Negative for redness and visual disturbance.  Respiratory: Positive for cough and shortness of breath. Negative for wheezing.   Cardiovascular: Negative for chest pain, palpitations and leg swelling.  Gastrointestinal: Negative for  abdominal pain, nausea and vomiting.       Negative for changes in bowel habits.  Endocrine: Negative for cold intolerance and heat intolerance.  Genitourinary: Negative for decreased urine volume and hematuria.  Musculoskeletal: Positive for arthralgias, gait problem and myalgias.  Skin: Positive for wound. Negative for rash.  Allergic/Immunologic: Positive for environmental allergies.  Neurological: Negative for syncope, weakness and headaches.  Hematological: Negative for adenopathy. Bruises/bleeds easily.  Psychiatric/Behavioral: Negative for confusion, sleep disturbance and suicidal ideas. The patient is nervous/anxious.       Current Outpatient Prescriptions on File Prior to Visit  Medication Sig Dispense Refill  . albuterol (PROVENTIL) (2.5 MG/3ML) 0.083% nebulizer solution Take 3 mLs (2.5 mg total) by nebulization every 4 (four) hours as needed for wheezing or shortness of breath. 75 mL 12  . albuterol (VENTOLIN HFA) 108 (90 Base) MCG/ACT inhaler Inhale 2 puffs into the lungs every 6 (six) hours as needed for wheezing or shortness of breath. Do not use if using nebs. 18 Inhaler 0  . arformoterol (BROVANA) 15 MCG/2ML NEBU Take 2 mLs (15 mcg total) by nebulization 2 (two) times daily. 120 mL 6  . aspirin 81 MG tablet Take 81 mg by mouth daily.      . budesonide (PULMICORT) 0.5 MG/2ML nebulizer solution Take 2 mLs (0.5 mg total) by nebulization 2 (two) times daily. 120 mL 6  . Calcium Carbonate-Vitamin D (CALTRATE 600+D) 600-400 MG-UNIT per tablet Take 1 tablet by mouth daily.      . Cholecalciferol (VITAMIN D) 1000 UNITS capsule Take 1,000 Units by  mouth daily.      . simvastatin (ZOCOR) 20 MG tablet Take 1 tablet (20 mg total) by mouth at bedtime. 90 tablet 3   No current facility-administered medications on file prior to visit.      Past Medical History:  Diagnosis Date  . Acute asthmatic bronchitis   . Anxiety   . Atypical chest pain   . congenital nystagmus   . DJD  (degenerative joint disease)   . Fibromyalgia   . Low back pain syndrome   . Memory loss   . Other and unspecified hyperlipidemia   . Venous insufficiency    No Known Allergies  Social History   Social History  . Marital status: Widowed    Spouse name: N/A  . Number of children: 3  . Years of education: N/A   Occupational History  .  Retired   Social History Main Topics  . Smoking status: Former Smoker    Packs/day: 2.00    Years: 30.00    Types: Cigarettes    Quit date: 10/28/1991  . Smokeless tobacco: Never Used  . Alcohol use Yes  . Drug use: No  . Sexual activity: Not Asked   Other Topics Concern  . None   Social History Narrative   1 sibling alive age 16   1 sibling alive age 47   1 sibling deceased age 74   1 sibling alive age 58    Vitals:   05/27/17 0919  BP: 118/70  Pulse: 83  Resp: 16   Body mass index is 20.3 kg/m.  Wt Readings from Last 3 Encounters:  05/27/17 118 lb 4 oz (53.6 kg)  02/06/17 118 lb 8 oz (53.8 kg)  11/24/16 123 lb (55.8 kg)    Physical Exam  Nursing note and vitals reviewed. Constitutional: She is oriented to person, place, and time. She appears well-developed. No distress.  HENT:  Head: Atraumatic.  Mouth/Throat: Oropharynx is clear and moist and mucous membranes are normal. She has dentures.  Eyes: Pupils are equal, round, and reactive to light. Conjunctivae are normal.  Cardiovascular: Normal rate and regular rhythm.   No murmur heard. Pulses:      Dorsalis pedis pulses are 2+ on the right side, and 2+ on the left side.  Respiratory: Effort normal and breath sounds normal. No respiratory distress.  GI: Soft. She exhibits no mass. There is no hepatomegaly. There is no tenderness.  Musculoskeletal: She exhibits no edema.  Lymphadenopathy:    She has no cervical adenopathy.  Neurological: She is alert and oriented to person, place, and time. She has normal strength. Coordination normal.  She had to think for a few  seconds before telling me today's date.  Mildly unstable gait assisted with cane.  Skin: Skin is warm. Laceration and lesion noted. No rash noted. No erythema.     Left forearm,dorsal aspect: 1.5 cm erythematous area, mild local heat, no induration or fluctuant area. Small skin flap healing well, no drainage.  Psychiatric: She has a normal mood and affect.  Well groomed, good eye contact.     ASSESSMENT AND PLAN:   [name] was seen today for follow-up.  Diagnoses and all orders for this visit:  Insomnia, unspecified type  Stable. Good sleep hygiene. Some side effects of medication discussed. F/U in 4 months.  -     traZODone (DESYREL) 50 MG tablet; Take 1 tablet (50 mg total) by mouth at bedtime.  Fatigue, unspecified type  Chronic and getting worse. Fall  precautions. Nap at noon or early afternoon may help. Medications and some of her chronic medical problems can also aggravate problem.  -     Basic metabolic panel  Dyspnea on exertion  Stable. We discussed possible etiologies, COPD most likely. Also Hx of CAD. Continue following with cardiologists and pulmonologist.  Skin tear of left forearm without complication, initial encounter  Injury prevention discussed. Topical abx to continue but different to neosporin. Monitor for signs of complications and keep area clean with soap and water.  -     mupirocin ointment (BACTROBAN) 2 %; Place 1 application into the nose 2 (two) times daily.  Generalized anxiety disorder  I was planning on decreasing Lexapro but she would like to continue same dose. No changes in Alprazolam,side effects discussed.       Rx for Alprazolam last filled on 04/04/17. F/U in 3-4 months.   -     ALPRAZolam (XANAX) 0.25 MG tablet; TAKE 1/2 - 1 TABS BY MOUTH TWICE A DAY AS NEEDED no more than 45 tabs per month. -     escitalopram (LEXAPRO) 20 MG tablet; Take 1 tablet (20 mg total) by mouth daily.  Mixed hyperlipidemia  No changes in  current management, will follow labs done today and will give further recommendations accordingly. F/U in 6-12 months. -     Lipid panel  Chronic obstructive pulmonary disease, unspecified COPD type ([place])  Continue following with Dr Mortimer Fries.   -[name] was advised to return sooner than planned today if new concerns arise.       Jay Haskew G. [name], MD  [facility]. [place] office.

## 2017-05-27 ENCOUNTER — Encounter: Payer: Self-pay | Admitting: Family Medicine

## 2017-05-27 ENCOUNTER — Ambulatory Visit (INDEPENDENT_AMBULATORY_CARE_PROVIDER_SITE_OTHER): Payer: Medicare Other | Admitting: Family Medicine

## 2017-05-27 VITALS — BP 118/70 | HR 83 | Resp 16 | Ht 64.0 in | Wt 118.2 lb

## 2017-05-27 DIAGNOSIS — G4700 Insomnia, unspecified: Secondary | ICD-10-CM

## 2017-05-27 DIAGNOSIS — I25111 Atherosclerotic heart disease of native coronary artery with angina pectoris with documented spasm: Secondary | ICD-10-CM | POA: Diagnosis not present

## 2017-05-27 DIAGNOSIS — J449 Chronic obstructive pulmonary disease, unspecified: Secondary | ICD-10-CM

## 2017-05-27 DIAGNOSIS — F411 Generalized anxiety disorder: Secondary | ICD-10-CM | POA: Diagnosis not present

## 2017-05-27 DIAGNOSIS — E782 Mixed hyperlipidemia: Secondary | ICD-10-CM | POA: Diagnosis not present

## 2017-05-27 DIAGNOSIS — R0609 Other forms of dyspnea: Secondary | ICD-10-CM | POA: Diagnosis not present

## 2017-05-27 DIAGNOSIS — S51812A Laceration without foreign body of left forearm, initial encounter: Secondary | ICD-10-CM | POA: Diagnosis not present

## 2017-05-27 DIAGNOSIS — R5383 Other fatigue: Secondary | ICD-10-CM | POA: Diagnosis not present

## 2017-05-27 LAB — BASIC METABOLIC PANEL
BUN: 12 mg/dL (ref 6–23)
CHLORIDE: 101 meq/L (ref 96–112)
CO2: 32 meq/L (ref 19–32)
CREATININE: 0.67 mg/dL (ref 0.40–1.20)
Calcium: 9.5 mg/dL (ref 8.4–10.5)
GFR: 88.01 mL/min (ref >60.00)
GLUCOSE: 84 mg/dL (ref 70–99)
Potassium: 4.3 mEq/L (ref 3.5–5.1)
Sodium: 138 mEq/L (ref 135–145)

## 2017-05-27 LAB — LIPID PANEL
CHOL/HDL RATIO: 2
Cholesterol: 125 mg/dL (ref 0–200)
HDL: 54.7 mg/dL (ref >39.00)
LDL CALC: 60 mg/dL (ref 0–99)
NONHDL: 70.55
Triglycerides: 52 mg/dL (ref 0.0–149.0)
VLDL: 10.4 mg/dL (ref 0.0–40.0)

## 2017-05-27 MED ORDER — MUPIROCIN 2 % EX OINT: 1.0000 "application " | TOPICAL_OINTMENT | Freq: Two times a day (BID) | CUTANEOUS | 0 refills | Status: AC

## 2017-05-27 MED ORDER — ALPRAZOLAM 0.25 MG PO TABS: ORAL_TABLET | ORAL | 3 refills | Status: DC

## 2017-05-27 NOTE — Patient Instructions (Signed)
A few things to remember from today's visit:   Generalized anxiety disorder - Plan: ALPRAZolam (XANAX) 0.25 MG tablet  Fatigue, unspecified type - Plan: Basic metabolic panel  Insomnia, unspecified type  Atherosclerosis of native coronary artery of native heart with angina pectoris with documented spasm (HCC)  Centrilobular emphysema (HCC)  Mixed hyperlipidemia - Plan: Lipid panel  Shortness of breath   No changes today. Caution with Alprazolam.   Please be sure medication list is accurate. If a new problem present, please set up appointment sooner than planned today.

## 2017-05-31 MED ORDER — ESCITALOPRAM OXALATE 20 MG PO TABS: 20.0000 mg | ORAL_TABLET | Freq: Every day | ORAL | 2 refills | Status: DC

## 2017-05-31 MED ORDER — TRAZODONE HCL 50 MG PO TABS: 50.0000 mg | ORAL_TABLET | Freq: Every day | ORAL | 1 refills | Status: DC

## 2017-06-15 ENCOUNTER — Telehealth: Payer: Self-pay | Admitting: Family Medicine

## 2017-06-15 NOTE — Telephone Encounter (Signed)
Pts daughter called in wanted to know what she should do about her mothers L arm that is inflamed and red with bumps they just got back from the beach and not really sure what to do the cream is not helping her. Pt is scheduled 06/16/17 at 2:30

## 2017-06-15 NOTE — Telephone Encounter (Signed)
FYI, patient is coming in to see you tomorrow at 2:30.

## 2017-06-16 ENCOUNTER — Ambulatory Visit (INDEPENDENT_AMBULATORY_CARE_PROVIDER_SITE_OTHER): Payer: Medicare Other | Admitting: Family Medicine

## 2017-06-16 ENCOUNTER — Encounter: Payer: Self-pay | Admitting: Family Medicine

## 2017-06-16 VITALS — BP 110/78 | HR 83 | Resp 16 | Ht 64.0 in | Wt 119.4 lb

## 2017-06-16 DIAGNOSIS — L298 Other pruritus: Secondary | ICD-10-CM

## 2017-06-16 DIAGNOSIS — I25111 Atherosclerotic heart disease of native coronary artery with angina pectoris with documented spasm: Secondary | ICD-10-CM | POA: Diagnosis not present

## 2017-06-16 MED ORDER — TRIAMCINOLONE ACETONIDE 0.1 % EX CREA: 1.0000 "application " | TOPICAL_CREAM | Freq: Two times a day (BID) | CUTANEOUS | 0 refills | Status: AC

## 2017-06-16 MED ORDER — CEPHALEXIN 500 MG PO CAPS: 500.0000 mg | ORAL_CAPSULE | Freq: Two times a day (BID) | ORAL | 0 refills | Status: AC

## 2017-06-16 NOTE — Patient Instructions (Addendum)
A few things to remember from today's visit:   Pruritic erythematous rash - Plan: triamcinolone cream (KENALOG) 0.1 %, cephALEXin (KEFLEX) 500 MG capsule  ? Contact dermatitis. ? Neosporin reaction.  Local ice. Topical steroids ,small dose for 14 days.  Monitor for signs of infection. Keep area uncovered.   Contact Dermatitis Dermatitis is redness, soreness, and swelling (inflammation) of the skin. Contact dermatitis is a reaction to certain substances that touch the skin. You either touched something that irritated your skin, or you have allergies to something you touched. Follow these instructions at home: [place] your skin as needed.  Apply cool compresses to the affected areas.  Try taking a bath with: ? Epsom salts. Follow the instructions on the package. You can get these at a pharmacy or grocery store. ? Baking soda. Pour a small amount into the bath as told by your doctor. ? Colloidal oatmeal. Follow the instructions on the package. You can get this at a pharmacy or grocery store.  Try applying baking soda paste to your skin. Stir water into baking soda until it looks like paste.  Do not scratch your skin.  Bathe less often.  Bathe in lukewarm water. Avoid using hot water. Medicines  Take or apply over-the-counter and prescription medicines only as told by your doctor.  If you were prescribed an antibiotic medicine, take or apply your antibiotic as told by your doctor. Do not stop taking the antibiotic even if your condition starts to get better. General instructions  Keep all follow-up visits as told by your doctor. This is important.  Avoid the substance that caused your reaction. If you do not know what caused it, keep a journal to try to track what caused it. Write down: ? What you eat. ? What cosmetic products you use. ? What you drink. ? What you wear in the affected area. This includes jewelry.  If you were given a bandage (dressing),  take care of it as told by your doctor. This includes when to change and remove it. Contact a doctor if:  You do not get better with treatment.  Your condition gets worse.  You have signs of infection such as: ? Swelling. ? Tenderness. ? Redness. ? Soreness. ? Warmth.  You have a fever.  You have new symptoms. Get help right away if:  You have a very bad headache.  You have neck pain.  Your neck is stiff.  You throw up (vomit).  You feel very sleepy.  You see red streaks coming from the affected area.  Your bone or joint underneath the affected area becomes painful after the skin has healed.  The affected area turns darker.  You have trouble breathing. This information is not intended to replace advice given to you by your health care provider. Make sure you discuss any questions you have with your health care provider. Document Released: 08/10/2009 Document Revised: 03/20/2016 Document Reviewed: 02/28/2015 Elsevier Interactive Patient Education  2018 [employer].  Please be sure medication list is accurate. If a new problem present, please set up appointment sooner than planned today.

## 2017-06-16 NOTE — Progress Notes (Signed)
ACUTE VISIT   HPI:  Chief Complaint  Patient presents with  . Rash    [name] is a 83 y.o. female, who is here today with her daughter complaining of pruritic, erythematous rash on left forearm.  Last office visit, 05/27/2017, I noted a skin tear on left forearm, scratch caused by her dog. I recommended topical Mupirocin x 7 days. She applied antibiotic ointment and cover area with a "big" bandage. She then continue OTC triple antibiotic oint. Skin tear has healed completely.  About 6-7 days ago she started with erythematosus,pruritic rash area around prior skin lesion.   Rash  This is a new problem. The current episode started 1 to 4 weeks ago. The problem is unchanged. The affected locations include the left arm. The rash is characterized by redness, scaling and itchiness. It is unknown if there was an exposure to a precipitant. Associated symptoms include coughing (no more than her baseline), fatigue (no more than usual), joint pain (Hx of OA) and shortness of breath (at ehr baseline). Pertinent negatives include no congestion, diarrhea, fever, nail changes, rhinorrhea, sore throat or vomiting. Past treatments include anti-itch cream. The treatment provided mild relief. Her past medical history is significant for allergies and asthma.   She has not identified exacerbating factors. Pruritic is alleviated by covering area with a napkin. Are is mildly sore upon scratching.   Review of Systems  Constitutional: Positive for fatigue (no more than usual). Negative for activity change, appetite change, chills and fever.  HENT: Negative for congestion, mouth sores, rhinorrhea, sore throat and trouble swallowing.   Eyes: Negative for discharge, redness and itching.  Respiratory: Positive for cough (no more than her baseline) and shortness of breath (at ehr baseline). Negative for chest tightness.   Gastrointestinal: Negative for abdominal pain, diarrhea, nausea and  vomiting.       No changes in bowel habits.  Genitourinary: Negative for decreased urine volume and hematuria.  Musculoskeletal: Positive for gait problem (chronic) and joint pain (Hx of OA). Negative for myalgias.          Skin: Positive for rash. Negative for nail changes and wound.  Allergic/Immunologic: Positive for environmental allergies.  Neurological: Negative for syncope, weakness and headaches.  Psychiatric/Behavioral: Negative for confusion and hallucinations. The patient is nervous/anxious.       Current Outpatient Prescriptions on File Prior to Visit  Medication Sig Dispense Refill  . albuterol (PROVENTIL) (2.5 MG/3ML) 0.083% nebulizer solution Take 3 mLs (2.5 mg total) by nebulization every 4 (four) hours as needed for wheezing or shortness of breath. 75 mL 12  . albuterol (VENTOLIN HFA) 108 (90 Base) MCG/ACT inhaler Inhale 2 puffs into the lungs every 6 (six) hours as needed for wheezing or shortness of breath. Do not use if using nebs. 18 Inhaler 0  . ALPRAZolam (XANAX) 0.25 MG tablet TAKE 1/2 - 1 TABS BY MOUTH TWICE A DAY AS NEEDED no more than 45 tabs per month. 45 tablet 3  . arformoterol (BROVANA) 15 MCG/2ML NEBU Take 2 mLs (15 mcg total) by nebulization 2 (two) times daily. 120 mL 6  . aspirin 81 MG tablet Take 81 mg by mouth daily.      . budesonide (PULMICORT) 0.5 MG/2ML nebulizer solution Take 2 mLs (0.5 mg total) by nebulization 2 (two) times daily. 120 mL 6  . Calcium Carbonate-Vitamin D (CALTRATE 600+D) 600-400 MG-UNIT per tablet Take 1 tablet by mouth daily.      . Cholecalciferol (  VITAMIN D) 1000 UNITS capsule Take 1,000 Units by mouth daily.      Marland Kitchen escitalopram (LEXAPRO) 20 MG tablet Take 1 tablet (20 mg total) by mouth daily. 90 tablet 2  . simvastatin (ZOCOR) 20 MG tablet Take 1 tablet (20 mg total) by mouth at bedtime. 90 tablet 3  . traZODone (DESYREL) 50 MG tablet Take 1 tablet (50 mg total) by mouth at bedtime. 90 tablet 1   No current  facility-administered medications on file prior to visit.      Past Medical History:  Diagnosis Date  . Acute asthmatic bronchitis   . Anxiety   . Atypical chest pain   . congenital nystagmus   . DJD (degenerative joint disease)   . Fibromyalgia   . Low back pain syndrome   . Memory loss   . Other and unspecified hyperlipidemia   . Venous insufficiency    No Known Allergies  Social History   Social History  . Marital status: Widowed    Spouse name: N/A  . Number of children: 3  . Years of education: N/A   Occupational History  .  Retired   Social History Main Topics  . Smoking status: Former Smoker    Packs/day: 2.00    Years: 30.00    Types: Cigarettes    Quit date: 10/28/1991  . Smokeless tobacco: Never Used  . Alcohol use Yes  . Drug use: No  . Sexual activity: Not Asked   Other Topics Concern  . None   Social History Narrative   1 sibling alive age 77   1 sibling alive age 81   1 sibling deceased age 31   1 sibling alive age 34    Vitals:   06/16/17 1425  BP: 110/78  Pulse: 83  Resp: 16  SpO2: 93%   Body mass index is 20.49 kg/m.   Physical Exam  Nursing note and vitals reviewed. Constitutional: She appears well-developed. No distress.  HENT:  Head: Normocephalic and atraumatic.  Mouth/Throat: Oropharynx is clear and moist and mucous membranes are normal. She has dentures.  Eyes: Conjunctivae are normal.  Cardiovascular:  Pulses:      Radial pulses are 2+ on the left side.  Respiratory: Effort normal and breath sounds normal. No respiratory distress.  Musculoskeletal: She exhibits no edema.  Lymphadenopathy:    She has no cervical adenopathy.       Left: No supraclavicular and no epitrochlear adenopathy present.  Neurological: She is alert.  No focal deficit appreciated. Stable gait assisted by cane.  Skin: Skin is warm. Rash noted. No abrasion and no ecchymosis noted. Rash is macular. Rash is not pustular and not vesicular.       Scaly, erythematous macular lesion on left forearm. No local heat or drainage,no tender or indurated. There is no wound.   Psychiatric: Her speech is normal. Her mood appears anxious.  Well groomed, good eye contact.      ASSESSMENT AND PLAN:   [name] was seen today for rash.  Diagnoses and all orders for this visit:  Pruritic erythematous rash -     triamcinolone cream (KENALOG) 0.1 %; Apply 1 application topically 2 (two) times daily. -     cephALEXin (KEFLEX) 500 MG capsule; Take 1 capsule (500 mg total) by mouth 2 (two) times daily.    We discussed possible etiologies. I think this a local reaction to either bandage and/or Triple oint abb/Neomycin: ?topical dermatitis.  I don't think there is an active infectious  process but given the risk I still recommended oral abx. Side effects discussed. I recommended topical Triamcinolone twice daily, small amount for up to 14 days.  Local ice may help with pruritus. Monitor for signs of infection. Instructed about warning signs. Follow-up as needed.     -[name] Asa was advised to seek immediate medical attention if sudden worsening symptoms or to follow if they persist or if new concerns arise.     Mads Borgmeyer G. [name], MD  [facility]. [place] office.

## 2017-06-18 ENCOUNTER — Ambulatory Visit (INDEPENDENT_AMBULATORY_CARE_PROVIDER_SITE_OTHER): Payer: Medicare Other | Admitting: Internal Medicine

## 2017-06-18 ENCOUNTER — Encounter: Payer: Self-pay | Admitting: Internal Medicine

## 2017-06-18 VITALS — BP 110/62 | HR 82 | Resp 16 | Ht 64.0 in | Wt 119.0 lb

## 2017-06-18 DIAGNOSIS — J42 Unspecified chronic bronchitis: Secondary | ICD-10-CM | POA: Diagnosis not present

## 2017-06-18 DIAGNOSIS — J449 Chronic obstructive pulmonary disease, unspecified: Secondary | ICD-10-CM

## 2017-06-18 DIAGNOSIS — I25111 Atherosclerotic heart disease of native coronary artery with angina pectoris with documented spasm: Secondary | ICD-10-CM

## 2017-06-18 MED ORDER — ALBUTEROL SULFATE (2.5 MG/3ML) 0.083% IN NEBU: 2.5000 mg | INHALATION_SOLUTION | RESPIRATORY_TRACT | 12 refills | Status: DC | PRN

## 2017-06-18 MED ORDER — BUDESONIDE 0.5 MG/2ML IN SUSP: 0.5000 mg | Freq: Two times a day (BID) | RESPIRATORY_TRACT | 6 refills | Status: DC

## 2017-06-18 MED ORDER — PREDNISONE 20 MG PO TABS: 20.0000 mg | ORAL_TABLET | Freq: Every day | ORAL | 0 refills | Status: DC

## 2017-06-18 MED ORDER — ARFORMOTEROL TARTRATE 15 MCG/2ML IN NEBU: 15.0000 ug | INHALATION_SOLUTION | Freq: Two times a day (BID) | RESPIRATORY_TRACT | 6 refills | Status: DC

## 2017-06-18 NOTE — Patient Instructions (Signed)
Start prednisone 20 mg daily for 10 days Continue breathing treatments as prescribed

## 2017-06-18 NOTE — Progress Notes (Signed)
[facility] Consultation     Date: 06/18/2017,   MRN# [id number] Jeni Duling [facility] August 06, 1926 Code Status:  Code Status History    This patient does not have a recorded code status. Please follow your organizational policy for patients in this situation.    Former [employer]vertising account planner [employer] Patient    CHIEF COMPLAINT:   follow up COPD   HISTORY OF PRESENT ILLNESS    83 yo white female former smoker, quit 35 years ago, has dx of COPD unable to take inhaled meds at this time,  prescribed nebulized therapy and seems to helping her breathing No sign signs of infection at this time  PFT's 01/2015 Ratio 54%, Fev1 65%, DLCO 64% TLC 101%, RV 119% Interpretation: moderate COPD with BD response with hyperinflation and air trapping with diffusion impairment  PFT 03/31/2016 Ratio 52% FEV1 72% DLCO 70% TLC 88% RV 90% Interpretation: mild obstructive disease with BD response  ONO shows hypoxia-oxygen ordered  Doing well today, Has mild productive cough with some wheezing no fevers at this time   Current Medication:   Current Outpatient Prescriptions:  .  albuterol (PROVENTIL) (2.5 MG/3ML) 0.083% nebulizer solution, Take 3 mLs (2.5 mg total) by nebulization every 4 (four) hours as needed for wheezing or shortness of breath., Disp: 75 mL, Rfl: 12 .  albuterol (VENTOLIN HFA) 108 (90 Base) MCG/ACT inhaler, Inhale 2 puffs into the lungs every 6 (six) hours as needed for wheezing or shortness of breath. Do not use if using nebs., Disp: 18 Inhaler, Rfl: 0 .  ALPRAZolam (XANAX) 0.25 MG tablet, TAKE 1/2 - 1 TABS BY MOUTH TWICE A DAY AS NEEDED no more than 45 tabs per month., Disp: 45 tablet, Rfl: 3 .  arformoterol (BROVANA) 15 MCG/2ML NEBU, Take 2 mLs (15 mcg total) by nebulization 2 (two) times daily., Disp: 120 mL, Rfl: 6 .  aspirin 81 MG tablet, Take 81 mg by mouth daily.  , Disp: , Rfl:  .  budesonide (PULMICORT) 0.5 MG/2ML nebulizer solution, Take 2 mLs (0.5 mg total) by nebulization  2 (two) times daily., Disp: 120 mL, Rfl: 6 .  Calcium Carbonate-Vitamin D (CALTRATE 600+D) 600-400 MG-UNIT per tablet, Take 1 tablet by mouth daily.  , Disp: , Rfl:  .  cephALEXin (KEFLEX) 500 MG capsule, Take 1 capsule (500 mg total) by mouth 2 (two) times daily., Disp: 14 capsule, Rfl: 0 .  Cholecalciferol (VITAMIN D) 1000 UNITS capsule, Take 1,000 Units by mouth daily.  , Disp: , Rfl:  .  escitalopram (LEXAPRO) 20 MG tablet, Take 1 tablet (20 mg total) by mouth daily., Disp: 90 tablet, Rfl: 2 .  simvastatin (ZOCOR) 20 MG tablet, Take 1 tablet (20 mg total) by mouth at bedtime., Disp: 90 tablet, Rfl: 3 .  traZODone (DESYREL) 50 MG tablet, Take 1 tablet (50 mg total) by mouth at bedtime., Disp: 90 tablet, Rfl: 1 .  triamcinolone cream (KENALOG) 0.1 %, Apply 1 application topically 2 (two) times daily., Disp: 30 g, Rfl: 0     ALLERGIES   Patient has no known allergies.     REVIEW OF SYSTEMS   Review of Systems  Constitutional: Negative for chills, fever, malaise/fatigue and weight loss.  HENT: Negative for congestion and hearing loss.   Respiratory: Positive for cough and sputum production. Negative for hemoptysis, shortness of breath and wheezing.        Chronic cough and chronic sputum production  Cardiovascular: Negative for chest pain, palpitations, orthopnea and leg swelling.  Gastrointestinal:  Negative for heartburn and nausea.  Skin: Negative for rash.  Neurological: Negative for dizziness and headaches.  All other systems reviewed and are negative.   BP 110/62 (BP Location: Left Arm, Cuff Size: Normal)   Pulse 82   Resp 16   Ht 5\' 4"  (1.626 m)   Wt 119 lb (54 kg)   SpO2 92%   BMI 20.43 kg/m    PHYSICAL EXAM   Physical Exam  Constitutional: She is oriented to person, place, and time. No distress.  Cardiovascular: Normal rate, regular rhythm and normal heart sounds.   No murmur heard. Pulmonary/Chest: Effort normal and breath sounds normal. No stridor. No  respiratory distress. She has no wheezes. She has no rales.  Abdominal: Soft.  Neurological: She is alert and oriented to person, place, and time. No cranial nerve deficit.  Skin: Skin is warm. She is not diaphoretic.  Psychiatric: She has a normal mood and affect.       ASSESSMENT/PLAN   83 yo white female with Mild/Moderate COPD Gold Stage C  With chronic hypoxic resp failure, patient very frail  With mild COPD exacerbation  1.will continue Pulmicort Nebs 2.will continue LABA neb therapy with Brovana 3.albuterol Nebs every 4 hrs as needed 4.No need for abx and steroids at this time. 5.Continue oxygen at night #6 prednisone 20 mg daily for 10 days  Follow up in 6 months    Terion Hedman Patricia Pesa, M.D.  [facility]  Medical Director [place] Director [facility] Cardio-Pulmonary Department

## 2017-06-26 ENCOUNTER — Telehealth: Payer: Self-pay | Admitting: Internal Medicine

## 2017-06-26 DIAGNOSIS — J449 Chronic obstructive pulmonary disease, unspecified: Secondary | ICD-10-CM

## 2017-06-26 DIAGNOSIS — J42 Unspecified chronic bronchitis: Secondary | ICD-10-CM

## 2017-06-26 MED ORDER — ALBUTEROL SULFATE (2.5 MG/3ML) 0.083% IN NEBU: 2.5000 mg | INHALATION_SOLUTION | RESPIRATORY_TRACT | 12 refills | Status: DC | PRN

## 2017-06-26 MED ORDER — BUDESONIDE 0.5 MG/2ML IN SUSP
0.5000 mg | Freq: Two times a day (BID) | RESPIRATORY_TRACT | 12 refills | Status: DC
Start: 2017-06-26 — End: 2017-07-09

## 2017-06-26 MED ORDER — ARFORMOTEROL TARTRATE 15 MCG/2ML IN NEBU
15.0000 ug | INHALATION_SOLUTION | Freq: Two times a day (BID) | RESPIRATORY_TRACT | 12 refills | Status: DC
Start: 2017-06-26 — End: 2017-07-09

## 2017-06-26 NOTE — Telephone Encounter (Signed)
Pt daughter call stating we sent breathing treatments to wrong place  We need to send them to [facility]   Please advise

## 2017-06-26 NOTE — Telephone Encounter (Signed)
LMOV[facility] for [facility] and informed once RXs were signed by Mortimer Fries we would fax them to [place]. Nothing further needed.

## 2017-07-08 NOTE — Progress Notes (Signed)
Cardiology Office Note  Date:  07/09/2017   ID:  Jessica [name], DOB September 05, 1926, MRN [id number]  PCP:  [name], [name], MD   Chief Complaint  Patient presents with  . other    6 month follow up. Patient c/o SOB, chest pain on right side, weakness and swelling in ankles. Meds reviewed verbally with patient.     HPI:  Jessica [name]  is an 83 y/o woman with  mild to moderate aortic valve stenosis,  h/o HL,  fibromyalgia,  CAD, aortic athero on CT 2016 anxiety  moderate COPD/emphysema,  asthmatic bronchitis Previous smoking history but stopped over 30 years ago.  total knee replacement July 2013 by her report Mild gait instability, walks with a cane.  Previous history of falls with trauma to her face/forehead requiring stitches. She presents for follow-up of her mild to moderate aortic valve disease(by echo 01/2015)  and shortness of breath  She presents today with weakness, worsening cough Family presents with her and reports that cough and congestion is worse than her baseline She has been using nebulizer at home, significant sputum production Coughing in the office today, requiring napkins to catch the sputum Requiring assistance walking around as she is weak  No PND, orthopnea, very minimal ankle swelling left leg family was worried about heart failure  Does not check blood pressure at home Blood pressure low on today's visit No left sidechest pain on exertion, she did have some right-sided chest pain yesterday  EKG personally reviewed by myself on todays visit Shows normal sinus rhythm rate 98 bpm nonspecific ST abnormality  Other past medical history reviewed Still working at [employer] , does not want to retire   she continues to work on Production assistant, radio Using a neb treatment, breathing better, spitting up, thick "stuff" Pulmonary, Dr. Mortimer Fries,   significant exposure to secondhand smoke from her daughter   Previous fall Valentine's Day 2017, fractured her  right clavicle ( aortic calcification seen on chest x-ray  At that time)  no surgery was performed  difficulty with her memory per the family    said details of her story over and over on her visit today  Echocardiogram April 2016 showing mild to moderate aortic valve stenosis Carotid ultrasound November 2015 showing mild bilateral carotid disease  Previous trip to the ER with CP in 8/11.  Had post hospital Myoview in 9/11 with normal EF and question of inferior ischemia.   medical therapy pursued at that time. Asymptomatic since then  No recurrent CP or undue dyspnea. Does get fatigued. No palpitations, CHF or syncope.      PMH:   has a past medical history of Acute asthmatic bronchitis; Anxiety; Atypical chest pain; congenital nystagmus; DJD (degenerative joint disease); Fibromyalgia; Low back pain syndrome; Memory loss; Other and unspecified hyperlipidemia; and Venous insufficiency.  PSH:    Past Surgical History:  Procedure Laterality Date  . ABDOMINAL HYSTERECTOMY    . ANTERIOR CERVICAL DISCECTOMY    . CARPAL TUNNEL RELEASE  12/2011   right arm  . CATARACT EXTRACTION    . LUMBAR LAMINECTOMY    . right knee arthroscopy    . right shoulder replacement  04/2009  . right shoulder surgery  2008   Dr. Noemi Chapel    Current Outpatient Prescriptions  Medication Sig Dispense Refill  . albuterol (PROVENTIL) (2.5 MG/3ML) 0.083% nebulizer solution Take 3 mLs (2.5 mg total) by nebulization every 4 (four) hours as needed for wheezing or shortness of breath. DX:  COPD J44.9 75 mL 12  . albuterol (VENTOLIN HFA) 108 (90 Base) MCG/ACT inhaler Inhale 2 puffs into the lungs every 6 (six) hours as needed for wheezing or shortness of breath. Do not use if using nebs. 18 Inhaler 0  . ALPRAZolam (XANAX) 0.25 MG tablet TAKE 1/2 - 1 TABS BY MOUTH TWICE A DAY AS NEEDED no more than 45 tabs per month. 45 tablet 3  . arformoterol (BROVANA) 15 MCG/2ML NEBU Take 2 mLs (15 mcg total) by nebulization 2 (two)  times daily. DX: COPD J44.9 120 mL 12  . aspirin 81 MG tablet Take 81 mg by mouth daily.      . budesonide (PULMICORT) 0.5 MG/2ML nebulizer solution Take 2 mLs (0.5 mg total) by nebulization 2 (two) times daily. DX: COPD J44.9 120 mL 12  . Calcium Carbonate-Vitamin D (CALTRATE 600+D) 600-400 MG-UNIT per tablet Take 1 tablet by mouth daily.      . Cholecalciferol (VITAMIN D) 1000 UNITS capsule Take 1,000 Units by mouth daily.      Marland Kitchen escitalopram (LEXAPRO) 20 MG tablet Take 1 tablet (20 mg total) by mouth daily. 90 tablet 2  . simvastatin (ZOCOR) 20 MG tablet Take 1 tablet (20 mg total) by mouth at bedtime. 90 tablet 3  . traZODone (DESYREL) 50 MG tablet Take 1 tablet (50 mg total) by mouth at bedtime. 90 tablet 1  . azithromycin (ZITHROMAX) 250 MG tablet Take two the first day then one a day for 5 days total 6 each 0   No current facility-administered medications for this visit.      Allergies:   Patient has no known allergies.   Social History:  The patient  reports that she quit smoking about 25 years ago. Her smoking use included Cigarettes. She has a 60.00 pack-year smoking history. She has never used smokeless tobacco. She reports that she drinks alcohol. She reports that she does not use drugs.   Family History:   Family history is unknown by patient.    Review of Systems: Review of Systems  Respiratory: Positive for cough, sputum production and shortness of breath.   Cardiovascular: Negative.   Gastrointestinal: Negative.   Musculoskeletal: Negative.   Neurological: Positive for weakness.  Psychiatric/Behavioral: Negative.   All other systems reviewed and are negative.    PHYSICAL EXAM: VS:  BP (!) 92/50 (BP Location: Left Arm, Patient Position: Sitting, Cuff Size: Normal)   Ht 5\' 4"  (1.626 m)   Wt 117 lb (53.1 kg)   BMI 20.08 kg/m  , BMI Body mass index is 20.08 kg/m. GEN: Well nourished, well developed, in no acute distress , frail HEENT: normal  Neck: no JVD, +  carotid bruits eft greater than right, no masses Cardiac: RRR; 2/6 SEM RSB with bruit radiating up into the carotids,  no rubs, or gallops,no edema  Respiratory:   Clear, mildly decreased breath sounds throughout,normal work of breathing GI: soft, nontender, nondistended, + BS MS: no deformity or atrophy  Skin: warm and dry, no rash Neuro:  Strength and sensation are intact Psych: euthymic mood, full affect    Recent Labs: 02/06/2017: Hemoglobin 13.6; Platelets 312.0 05/27/2017: BUN 12; Creatinine, Ser 0.67; Potassium 4.3; Sodium 138    Lipid Panel Lab Results  Component Value Date   CHOL 125 05/27/2017   HDL 54.70 05/27/2017   LDLCALC 60 05/27/2017   TRIG 52.0 05/27/2017      Wt Readings from Last 3 Encounters:  07/09/17 117 lb (53.1 kg)  06/18/17 119  lb (54 kg)  06/16/17 119 lb 6 oz (54.1 kg)       ASSESSMENT AND PLAN:   Atherosclerosis of native coronary artery of native heart with angina pectoris with documented spasm ([place]) - Plan: EKG 12-Lead Currently with no symptoms of angina. No further workup at this time. Continue current medication regimen.  Mixed hyperlipidemia Cholesterol is at goal on the current lipid regimen. No changes to the medications were made.  Nonrheumatic aortic valve stenosis Aortic valve stenosis, mild to moderate in April 2016 Repeat echocardiogram ordered  Memory loss Symptoms seem stable, mild on today's visit  Chronic cough Chronically spitting up thick dark-colored mucus Suggested she use her nebulizer on a more regular basis Appears to have acute on chronic bronchitis, Antibiotics as below Will cc pulmonary  COPD exacerbation (HCC) Appears to be having COPD exacerbation, acute on chronic bronchitis Worsening sputum production, weakness on today's visit, hypotension, anorexia Family who presents with her feels it is getting worse Repeat blood pressure 90 systolic, asymptomatic but is weak Recommended she start Z-Pak If no  improvement in 10 days, back to her baseline,may need to consider alternate antibiotic  Shortness of breath Acute on chronic bronchitis suggested as above based on hypotension, weakness, worsening chest congestion, cough, sputum  Hypotension/orthostasis Concern for acute infection, acute on chronic bronchitis Encouraged family to provide fluids, lift any salt restriction, start antibiotics, monitor blood pressure at home If she gets worse, may need to be evaluated in the emergency room All of the above discussed with family Suggested family call us if she is not improving   Total encounter time more than 45 minutes  Greater than 50% was spent in counseling and coordination of care with the patient   Disposition:   F/U  6 months   No orders of the defined types were placed in this encounter.    Signed, Esmond Plants, M.D., Ph.D. 07/09/2017  [place], [place]

## 2017-07-09 ENCOUNTER — Encounter: Payer: Self-pay | Admitting: Cardiovascular Disease

## 2017-07-09 ENCOUNTER — Ambulatory Visit (INDEPENDENT_AMBULATORY_CARE_PROVIDER_SITE_OTHER): Payer: Medicare Other | Admitting: Cardiovascular Disease

## 2017-07-09 ENCOUNTER — Other Ambulatory Visit: Payer: Self-pay

## 2017-07-09 VITALS — BP 92/50 | Ht 64.0 in | Wt 117.0 lb

## 2017-07-09 DIAGNOSIS — R0602 Shortness of breath: Secondary | ICD-10-CM | POA: Diagnosis not present

## 2017-07-09 DIAGNOSIS — E782 Mixed hyperlipidemia: Secondary | ICD-10-CM | POA: Diagnosis not present

## 2017-07-09 DIAGNOSIS — I350 Nonrheumatic aortic (valve) stenosis: Secondary | ICD-10-CM | POA: Diagnosis not present

## 2017-07-09 DIAGNOSIS — I25111 Atherosclerotic heart disease of native coronary artery with angina pectoris with documented spasm: Secondary | ICD-10-CM

## 2017-07-09 DIAGNOSIS — J42 Unspecified chronic bronchitis: Secondary | ICD-10-CM

## 2017-07-09 DIAGNOSIS — R079 Chest pain, unspecified: Secondary | ICD-10-CM

## 2017-07-09 DIAGNOSIS — J449 Chronic obstructive pulmonary disease, unspecified: Secondary | ICD-10-CM

## 2017-07-09 DIAGNOSIS — J441 Chronic obstructive pulmonary disease with (acute) exacerbation: Secondary | ICD-10-CM

## 2017-07-09 DIAGNOSIS — I25118 Atherosclerotic heart disease of native coronary artery with other forms of angina pectoris: Secondary | ICD-10-CM | POA: Diagnosis not present

## 2017-07-09 DIAGNOSIS — I209 Angina pectoris, unspecified: Secondary | ICD-10-CM

## 2017-07-09 MED ORDER — ALBUTEROL SULFATE HFA 108 (90 BASE) MCG/ACT IN AERS: 2.0000 | INHALATION_SPRAY | Freq: Four times a day (QID) | RESPIRATORY_TRACT | 0 refills | Status: DC | PRN

## 2017-07-09 MED ORDER — BUDESONIDE 0.5 MG/2ML IN SUSP: 0.5000 mg | Freq: Two times a day (BID) | RESPIRATORY_TRACT | 12 refills | Status: DC

## 2017-07-09 MED ORDER — AZITHROMYCIN 250 MG PO TABS: ORAL_TABLET | ORAL | 0 refills | Status: DC

## 2017-07-09 MED ORDER — ARFORMOTEROL TARTRATE 15 MCG/2ML IN NEBU: 15.0000 ug | INHALATION_SOLUTION | Freq: Two times a day (BID) | RESPIRATORY_TRACT | 12 refills | Status: DC

## 2017-07-09 NOTE — Addendum Note (Signed)
Addended by: Valora Corporal on: 07/09/2017 02:12 PM   Modules accepted: Orders

## 2017-07-09 NOTE — Patient Instructions (Addendum)
Medication Instructions:   Zpak Two pills the first day Then one pill a day after that  Neb every 4 to 6 hours as needed for chest congestion  Pus the fluids/salt for low pressures  Labwork:  No new labs needed  Testing/Procedures:  We will order an echocardiogram for aortic valve stenosis, shortness of breath   Follow-Up: It was a pleasure seeing you in the office today. Please call us if you have new issues that need to be addressed before your next appt.  [phone]  Your physician wants you to follow-up in: 6 months.  You will receive a reminder letter in the mail two months in advance. If you don't receive a letter, please call our office to schedule the follow-up appointment.  If you need a refill on your cardiac medications before your next appointment, please call your pharmacy.

## 2017-07-15 NOTE — Addendum Note (Signed)
Addended by: Janan Ridge on: 07/15/2017 12:11 PM   Modules accepted: Orders

## 2017-07-27 ENCOUNTER — Other Ambulatory Visit: Payer: Self-pay | Admitting: Internal Medicine

## 2017-07-27 DIAGNOSIS — J42 Unspecified chronic bronchitis: Secondary | ICD-10-CM

## 2017-07-27 DIAGNOSIS — J449 Chronic obstructive pulmonary disease, unspecified: Secondary | ICD-10-CM

## 2017-07-27 MED ORDER — ALBUTEROL SULFATE (2.5 MG/3ML) 0.083% IN NEBU: 2.5000 mg | INHALATION_SOLUTION | RESPIRATORY_TRACT | 12 refills | Status: DC | PRN

## 2017-07-27 MED ORDER — ARFORMOTEROL TARTRATE 15 MCG/2ML IN NEBU: 15.0000 ug | INHALATION_SOLUTION | Freq: Two times a day (BID) | RESPIRATORY_TRACT | 12 refills | Status: DC

## 2017-07-27 NOTE — Telephone Encounter (Signed)
Pts daughter states rx has not been received by [facility] therefore reprinted. Put in DK folder for signature then will fax to [facility].

## 2017-07-28 ENCOUNTER — Ambulatory Visit (INDEPENDENT_AMBULATORY_CARE_PROVIDER_SITE_OTHER): Payer: Medicare Other

## 2017-07-28 ENCOUNTER — Other Ambulatory Visit: Payer: Self-pay

## 2017-07-28 DIAGNOSIS — R0602 Shortness of breath: Secondary | ICD-10-CM

## 2017-07-28 DIAGNOSIS — I350 Nonrheumatic aortic (valve) stenosis: Secondary | ICD-10-CM | POA: Diagnosis not present

## 2017-07-30 ENCOUNTER — Other Ambulatory Visit: Payer: Medicare Other

## 2017-08-04 ENCOUNTER — Telehealth: Payer: Self-pay | Admitting: Cardiovascular Disease

## 2017-08-04 NOTE — Telephone Encounter (Signed)
S/w patient's daughter, ok per DPR. She verbalized understanding of results and to f/u in 6 months with Dr [name]. She will call if patient worsens. Recall for Dr [name] has already been entered.

## 2017-08-04 NOTE — Telephone Encounter (Signed)
Patient daughter calling for echo results

## 2017-09-05 DIAGNOSIS — Z23 Encounter for immunization: Secondary | ICD-10-CM | POA: Diagnosis not present

## 2017-10-06 DIAGNOSIS — W540XXA Bitten by dog, initial encounter: Secondary | ICD-10-CM | POA: Diagnosis not present

## 2017-10-06 DIAGNOSIS — S61451A Open bite of right hand, initial encounter: Secondary | ICD-10-CM | POA: Diagnosis not present

## 2017-10-07 ENCOUNTER — Ambulatory Visit: Payer: Medicare Other | Admitting: Family Medicine

## 2017-10-10 ENCOUNTER — Other Ambulatory Visit: Payer: Self-pay | Admitting: Family Medicine

## 2017-10-10 DIAGNOSIS — F411 Generalized anxiety disorder: Secondary | ICD-10-CM

## 2017-10-12 DIAGNOSIS — S61451D Open bite of right hand, subsequent encounter: Secondary | ICD-10-CM | POA: Diagnosis not present

## 2017-10-12 DIAGNOSIS — W540XXD Bitten by dog, subsequent encounter: Secondary | ICD-10-CM | POA: Diagnosis not present

## 2017-10-21 DIAGNOSIS — S61451D Open bite of right hand, subsequent encounter: Secondary | ICD-10-CM | POA: Diagnosis not present

## 2017-10-21 DIAGNOSIS — W540XXD Bitten by dog, subsequent encounter: Secondary | ICD-10-CM | POA: Diagnosis not present

## 2017-11-12 NOTE — Progress Notes (Signed)
HPI:   [name] is a 84 y.o. female, who is here today for 6 months follow up.  She has Hx of COPD/emphysema,CAD,aortic stenosis, anxiety,fibromyalgia.  She was last seen on 06/16/2017 for acute visit.  Since her last OV she has followed with her cardiologist. Sometimes a little" right chest pain. Exertional dyspnea when activities like walking.   She also follows with Dr Mortimer Fries, pulmonologist,for COPD.  Anxiety: She is currently on Lexapro 20 mg daily and Xanax 0.25 mg bid as needed. She denies depressed mood or suicidal thoughts. Xanax also helps her sleep.  Insomnia: She takes Trazodone 50 mg at bedtime.  She sleeps well, denies side effects and she feels rested the next day.  Vit D deficiency: She is on OTC Vit D3 1000 U.  New concerns:  6 days ago "she was acting embarrassing" and complaining of right major labium pain. Daughter looked at area and noted  "cyst",painfull upon palpation, and serosangunolen drainage. No fever or chills. Problem has been stable. She used OTC cream that is supposed to "bring it to a head."  -She recently follow with cardiologist and had an echo done.  Daughter would like to go through results because she did not quite understand findings. Echo was done on 07/28/2017 because dyspnea and right-sided chest pain. LVEF 55-60% and grade 1 diastolic dysfunction.  She denies orthopnea or PND.  According to the daughter, she sleeps with her head elevated because she feels more comfortable but she has done so for many years.  Aortic valve stenosis has progressed, it is severe. 43-month follow-up was recommended.  She is still having occasional chest pain, it is not radiated, she has not identified exacerbating or alleviating factors. No associated palpitation or diaphoresis. Hx of fibromyalgia.  Exertional dyspnea has been stable.   Review of Systems  Constitutional: Positive for fatigue. Negative for activity change, appetite  change and fever.  HENT: Negative for mouth sores, nosebleeds and trouble swallowing.   Eyes: Negative for redness and visual disturbance.  Respiratory: Positive for cough (stable). Negative for wheezing and stridor.   Cardiovascular: Negative for palpitations and leg swelling.  Gastrointestinal: Negative for abdominal pain, nausea and vomiting.       Negative for changes in bowel habits.  Endocrine: Negative for cold intolerance and heat intolerance.  Genitourinary: Negative for decreased urine volume, dysuria, hematuria, vaginal bleeding and vaginal discharge.  Musculoskeletal: Positive for arthralgias, back pain and gait problem.  Skin: Positive for rash. Negative for wound.  Neurological: Negative for syncope, weakness and headaches.  Psychiatric/Behavioral: Negative for confusion and suicidal ideas. The patient is nervous/anxious.       Current Outpatient Medications on File Prior to Visit  Medication Sig Dispense Refill  . albuterol (PROVENTIL) (2.5 MG/3ML) 0.083% nebulizer solution Take 3 mLs (2.5 mg total) by nebulization every 4 (four) hours as needed for wheezing or shortness of breath. DX: COPD J44.9 75 mL 12  . albuterol (VENTOLIN HFA) 108 (90 Base) MCG/ACT inhaler Inhale 2 puffs into the lungs every 6 (six) hours as needed for wheezing or shortness of breath. Do not use if using nebs. 18 Inhaler 0  . ALPRAZolam (XANAX) 0.25 MG tablet TAKE ONE-HALF TO ONE TABLET BY MOUTH TWICE DAILY AS NEEDED.NO MORE THAN 45 TABS PER MONTH 45 tablet 1  . arformoterol (BROVANA) 15 MCG/2ML NEBU Take 2 mLs (15 mcg total) by nebulization 2 (two) times daily. DX: COPD J44.9 120 mL 12  . aspirin 81 MG  tablet Take 81 mg by mouth daily.      Marland Kitchen azithromycin (ZITHROMAX) 250 MG tablet Take two the first day then one a day for 5 days total 6 each 0  . budesonide (PULMICORT) 0.5 MG/2ML nebulizer solution Take 2 mLs (0.5 mg total) by nebulization 2 (two) times daily. DX: COPD J44.9 120 mL 12  . Calcium  Carbonate-Vitamin D (CALTRATE 600+D) 600-400 MG-UNIT per tablet Take 1 tablet by mouth daily.      . Cholecalciferol (VITAMIN D) 1000 UNITS capsule Take 1,000 Units by mouth daily.      Marland Kitchen escitalopram (LEXAPRO) 20 MG tablet Take 1 tablet (20 mg total) by mouth daily. 90 tablet 2  . simvastatin (ZOCOR) 20 MG tablet Take 1 tablet (20 mg total) by mouth at bedtime. 90 tablet 3  . traZODone (DESYREL) 50 MG tablet Take 1 tablet (50 mg total) by mouth at bedtime. 90 tablet 1   No current facility-administered medications on file prior to visit.      Past Medical History:  Diagnosis Date  . Acute asthmatic bronchitis   . Anxiety   . Atypical chest pain   . congenital nystagmus   . DJD (degenerative joint disease)   . Fibromyalgia   . Low back pain syndrome   . Memory loss   . Other and unspecified hyperlipidemia   . Venous insufficiency    No Known Allergies  Social History   Socioeconomic History  . Marital status: Widowed    Spouse name: None  . Number of children: 3  . Years of education: None  . Highest education level: None  Social Needs  . Financial resource strain: None  . Food insecurity - worry: None  . Food insecurity - inability: None  . Transportation needs - medical: None  . Transportation needs - non-medical: None  Occupational History    Employer: RETIRED  Tobacco Use  . Smoking status: Former Smoker    Packs/day: 2.00    Years: 30.00    Pack years: 60.00    Types: Cigarettes    Last attempt to quit: 10/28/1991    Years since quitting: 26.0  . Smokeless tobacco: Never Used  Substance and Sexual Activity  . Alcohol use: Yes  . Drug use: No  . Sexual activity: None  Other Topics Concern  . None  Social History Narrative   1 sibling alive age 10   1 sibling alive age 30   1 sibling deceased age 52   1 sibling alive age 45    Vitals:   11/13/17 0939  BP: 110/60  Pulse: 81  Resp: 16  Temp: 97.9 F (36.6 C)  SpO2: 97%   Body mass index is 20.79  kg/m.    Physical Exam  Nursing note and vitals reviewed. Constitutional: She is oriented to person, place, and time. She appears well-developed. No distress.  HENT:  Head: Normocephalic and atraumatic.  Mouth/Throat: Oropharynx is clear and moist and mucous membranes are normal.  Eyes: Conjunctivae are normal. Pupils are equal, round, and reactive to light.  Cardiovascular: Normal rate and regular rhythm.  No murmur heard. Pulses:      Dorsalis pedis pulses are 2+ on the right side, and 2+ on the left side.  Respiratory: Effort normal and breath sounds normal. No respiratory distress.  GI: Soft. She exhibits no mass. There is no tenderness.  Genitourinary:    No erythema or bleeding in the vagina. No vaginal discharge found.  Genitourinary Comments: Mild erythema  and edema on right major labia.  No induration appreciated.  Area is mildly tender upon palpation.   Musculoskeletal: She exhibits no edema.  Lymphadenopathy:    She has no cervical adenopathy.       Right: No inguinal adenopathy present.  Neurological: She is alert and oriented to person, place, and time.  No focal deficit appreciated. Unstable gait, assisted with a cane.  Skin: Skin is warm. No erythema.  Psychiatric: She has a normal mood and affect.  Well groomed, good eye contact.     ASSESSMENT AND PLAN:   [name] [facility] was seen today for 6 months follow-up.   Diagnoses and all orders for this visit:  Abscess of labia majora  I do not pain I&D is necessary today, there is not a fluctuant area appreciated today. Sitz bath may help. Oral antibiotic recommended, some side effects discussed. Clearly instructed about warning signs. If not greatly improved in 48-72 hours she is going to need a gynecology referral/evaluation.   -     sulfamethoxazole-trimethoprim (BACTRIM DS,SEPTRA DS) 800-160 MG tablet; Take 1 tablet by mouth 2 (two) times daily for 7 days.  Generalized anxiety  disorder  Otherwise stable. No changes in Lexapro or Xanax. We reviewed some side effects. Follow-up in 5 months.   Nonrheumatic aortic valve stenosis  We discussed echo results and educated about diagnosis (symptoms and prognosis). Continue following with cardiologist.  Chest pain, unspecified type  Possible etiologies discussed, including musculoskeletal, cardiac, GERD among some. This problem seems to be stable. Instructed about warning signs.  Shortness of breath  Chronic and is stable. Some of chronic medical problems could  contribute to this problem: COPD, aortic stenosis, deconditioning among some. Recommend avoiding trigger factors. Continue following with pulmonologist for COPD and with cardiologist for aortic valve stenosis. Instructed about warning signs.   -[name] Stephanie was advised to return sooner than planned today if new concerns arise.       [name], MD  [facility]. [place] office.

## 2017-11-13 ENCOUNTER — Encounter: Payer: Self-pay | Admitting: Family Medicine

## 2017-11-13 ENCOUNTER — Ambulatory Visit (INDEPENDENT_AMBULATORY_CARE_PROVIDER_SITE_OTHER): Payer: Medicare Other | Admitting: Family Medicine

## 2017-11-13 VITALS — BP 110/60 | HR 81 | Temp 97.9°F | Resp 16 | Ht 64.0 in | Wt 121.1 lb

## 2017-11-13 DIAGNOSIS — R079 Chest pain, unspecified: Secondary | ICD-10-CM | POA: Diagnosis not present

## 2017-11-13 DIAGNOSIS — I350 Nonrheumatic aortic (valve) stenosis: Secondary | ICD-10-CM | POA: Diagnosis not present

## 2017-11-13 DIAGNOSIS — F411 Generalized anxiety disorder: Secondary | ICD-10-CM | POA: Diagnosis not present

## 2017-11-13 DIAGNOSIS — N764 Abscess of vulva: Secondary | ICD-10-CM | POA: Diagnosis not present

## 2017-11-13 DIAGNOSIS — R0602 Shortness of breath: Secondary | ICD-10-CM | POA: Diagnosis not present

## 2017-11-13 MED ORDER — SULFAMETHOXAZOLE-TRIMETHOPRIM 800-160 MG PO TABS: 1.0000 | ORAL_TABLET | Freq: Two times a day (BID) | ORAL | 0 refills | Status: AC

## 2017-11-13 NOTE — Patient Instructions (Addendum)
A few things to remember from today's visit:   Generalized anxiety disorder  Abscess of labia majora - Plan: sulfamethoxazole-trimethoprim (BACTRIM DS,SEPTRA DS) 800-160 MG tablet  Nonrheumatic aortic valve stenosis  Let me know if abscess is not greatly better in 2-3 days, she may need going to gyn.   Aortic Valve Stenosis Aortic valve stenosis is a narrowing of the aortic valve. The aortic valve opens and closes to regulate blood flow between the lower left chamber of the heart (left ventricle) and the blood vessel that leads away from the heart (aorta). When the aortic valve becomes narrow, it makes it difficult for the heart to pump blood into the aorta, which causes the heart to work harder. The extra work can weaken the heart over time. Aortic valve stenosis can range from mild to severe. If untreated, it can become more severe over time and can lead to heart failure. What are the causes? This condition may be caused by:  Buildup of calcium around and on the valve. This can occur with aging. This is the most common cause of aortic valve stenosis.  Birth defect.  Rheumatic fever.  Radiation to the chest.  What increases the risk? You may be more likely to develop this condition if:  You are over the age of 10.  You were born with an abnormal bicuspid valve.  What are the signs or symptoms? You may have no symptoms until your condition becomes severe. It may take 10-20 years for mild or moderate aortic valve stenosis to become severe. Symptoms may include:  Shortness of breath. This may get worse during physical activity.  Feeling unusually weak and tired (fatigue).  Extreme discomfort in the chest, neck, or arm (angina).  A heartbeat that is irregular or faster than normal (palpitations).  Dizziness or fainting. This may happen when you get physically tired or after you take certain heart medicines, such as nitroglycerin.  How is this diagnosed? This condition may  be diagnosed with:  A physical exam.  Echocardiogram. This is a type of imaging test that uses sound waves (ultrasound) to make an image of your heart. There are two types that may be used: ? Transthoracic echocardiogram (TTE). This type of echocardiogram is noninvasive, and it is usually done first. ? Transesophageal echocardiogram (TEE). This type of echocardiogram is done by passing a flexible tube down your esophagus. The heart and the esophagus are close to each other, so your health care provider can take very clear, detailed pictures of the heart using this type of test.  Cardiac catheterization. In this procedure, a thin, flexible tube (catheter) is passed through a large vein in your neck, groin, or arm. This procedure provides information about arteries, structures, blood pressure, and oxygen levels in your heart.  Electrocardiogram (ECG). This records the electrical impulses of your heart and assesses heart function.  Stress tests. These are tests that evaluate the blood supply to your heart and your heart's response to exercise.  Blood tests.  You may work with a health care provider who specializes in the heart (cardiologist). How is this treated? Treatment depends on how severe your condition is and what your symptoms are. You will need to have your heart checked regularly to make sure that your condition is not getting worse or causing serious problems. If your condition is mild, no treatment may be needed. Treatment may include:  Medicines that help keep your heart rate regular.  Medicines that thin your blood (anticoagulants) to prevent the  formation of blood clots.  Antibiotic medicines to help prevent infection.  Surgery to replace your aortic valve. This is the most common treatment for aortic valve stenosis. Several types of surgeries are available. The surgery may be done through a large incision over your heart (open heart surgery), or it may be done using a  minimally invasive technique (transcatheter aortic valve replacement, or TAVR).  Follow these instructions at home: Lifestyle   Limit alcohol intake to no more than 1 drink per day for nonpregnant women and 2 drinks per day for men. One drink equals 12 oz of beer, 5 oz of wine, or 1 oz of hard liquor.  Do not use any tobacco products, such as cigarettes, chewing tobacco, or e-cigarettes. If you need help quitting, ask your health care provider.  Work with your health care provider to manage your blood pressure and cholesterol.  Maintain a healthy weight. Eating and drinking  Follow instructions from your health care provider about eating or drinking restrictions. ? Limit how much caffeine you drink. Caffeine can affect your heart's rate and rhythm.  Drink enough fluid to keep your urine clear or pale yellow.  Eat a heart-healthy diet. This should include plenty of fresh fruits and vegetables. If you eat meat, it should be lean cuts. Avoid foods that are: ? High in salt, saturated fat, or sugar. ? Canned or highly processed. ? Fried. Activity  Return to your normal activities as told by your health care provider. Ask your health care provider what activities are safe for you.  Exercise regularly, as told by your health care provider. Ask your health care provider what types of exercise are safe for you.  If your aortic valve stenosis is mild, you may need to avoid only very intense physical activity. The more severe your aortic valve stenosis is, the more activities you may need to avoid. General instructions  Take over-the-counter and prescription medicines only as told by your health care provider.  If you are a woman and you plan to become pregnant, talk with your health care provider before you become pregnant.  Tell all health care providers who care for you that you have aortic valve stenosis.  Keep all follow-up visits as told by your health care provider. This is  important. Contact a health care provider if:  You have a fever. Get help right away if:  You develop chest pain or tightness.  You develop shortness of breath or difficulty breathing.  You feel light-headed.  You feel like you might faint.  Your heartbeat is irregular or faster than normal. These symptoms may represent a serious problem that is an emergency. Do not wait to see if the symptoms will go away. Get medical help right away. Call your local emergency services (911 in the [place] Do not drive yourself to the hospital. This information is not intended to replace advice given to you by your health care provider. Make sure you discuss any questions you have with your health care provider. Document Released: 07/12/2003 Document Revised: 03/20/2016 Document Reviewed: 09/16/2015 Elsevier Interactive Patient Education  2017 [place].  Please be sure medication list is accurate. If a new problem present, please set up appointment sooner than planned today.

## 2017-11-21 ENCOUNTER — Encounter: Payer: Self-pay | Admitting: Family Medicine

## 2017-11-27 ENCOUNTER — Other Ambulatory Visit: Payer: Self-pay | Admitting: Family Medicine

## 2017-11-27 DIAGNOSIS — G4700 Insomnia, unspecified: Secondary | ICD-10-CM

## 2017-12-08 DIAGNOSIS — H524 Presbyopia: Secondary | ICD-10-CM | POA: Diagnosis not present

## 2017-12-08 DIAGNOSIS — H52223 Regular astigmatism, bilateral: Secondary | ICD-10-CM | POA: Diagnosis not present

## 2017-12-08 DIAGNOSIS — H353222 Exudative age-related macular degeneration, left eye, with inactive choroidal neovascularization: Secondary | ICD-10-CM | POA: Diagnosis not present

## 2017-12-08 DIAGNOSIS — H16223 Keratoconjunctivitis sicca, not specified as Sjogren's, bilateral: Secondary | ICD-10-CM | POA: Diagnosis not present

## 2017-12-08 DIAGNOSIS — B394 Histoplasmosis capsulati, unspecified: Secondary | ICD-10-CM | POA: Diagnosis not present

## 2017-12-08 DIAGNOSIS — H5213 Myopia, bilateral: Secondary | ICD-10-CM | POA: Diagnosis not present

## 2017-12-08 DIAGNOSIS — H353112 Nonexudative age-related macular degeneration, right eye, intermediate dry stage: Secondary | ICD-10-CM | POA: Diagnosis not present

## 2017-12-18 ENCOUNTER — Other Ambulatory Visit: Payer: Self-pay

## 2017-12-18 NOTE — Telephone Encounter (Signed)
Left message for pt to let us know if she has been taking this. If so, we will be happy to refill for her. Her PCP has been monitoring and her cholesterol is great!

## 2017-12-18 NOTE — Telephone Encounter (Signed)
Please review for refill. The patient was last given Simvastatin in 2016 by Dr. [name].  There has not been any refills since. The patient's PCP did the last cholesterol check.  Please advise if okay to refill.

## 2017-12-21 NOTE — Telephone Encounter (Signed)
Left voicemail message for patient to call back.

## 2017-12-23 ENCOUNTER — Telehealth: Payer: Self-pay | Admitting: Cardiovascular Disease

## 2017-12-23 MED ORDER — SIMVASTATIN 20 MG PO TABS: 20.0000 mg | ORAL_TABLET | Freq: Every day | ORAL | 3 refills | Status: DC

## 2017-12-23 NOTE — Telephone Encounter (Signed)
Pt daughter returning our call States it was about a refill  Please call back

## 2017-12-23 NOTE — Telephone Encounter (Signed)
She states that patient had extra medication and did not need refills until now of the simvastatin. She confirmed that she was in fact taking it and just needs new script sent in. Confirmed pharmacy of choice and sent prescription over for that. She was appreciative and had no further questions or concerns at this time.

## 2017-12-23 NOTE — Telephone Encounter (Signed)
Left voicemail message to call back  

## 2017-12-24 DIAGNOSIS — L299 Pruritus, unspecified: Secondary | ICD-10-CM | POA: Diagnosis not present

## 2017-12-24 DIAGNOSIS — H938X3 Other specified disorders of ear, bilateral: Secondary | ICD-10-CM | POA: Diagnosis not present

## 2017-12-24 DIAGNOSIS — H6123 Impacted cerumen, bilateral: Secondary | ICD-10-CM | POA: Diagnosis not present

## 2017-12-24 DIAGNOSIS — H9113 Presbycusis, bilateral: Secondary | ICD-10-CM | POA: Diagnosis not present

## 2018-01-26 ENCOUNTER — Other Ambulatory Visit: Payer: Self-pay | Admitting: Family Medicine

## 2018-01-26 DIAGNOSIS — F411 Generalized anxiety disorder: Secondary | ICD-10-CM

## 2018-01-27 DIAGNOSIS — H31093 Other chorioretinal scars, bilateral: Secondary | ICD-10-CM | POA: Diagnosis not present

## 2018-01-27 DIAGNOSIS — H353221 Exudative age-related macular degeneration, left eye, with active choroidal neovascularization: Secondary | ICD-10-CM | POA: Diagnosis not present

## 2018-01-27 DIAGNOSIS — H43821 Vitreomacular adhesion, right eye: Secondary | ICD-10-CM | POA: Diagnosis not present

## 2018-01-27 DIAGNOSIS — H353113 Nonexudative age-related macular degeneration, right eye, advanced atrophic without subfoveal involvement: Secondary | ICD-10-CM | POA: Diagnosis not present

## 2018-01-29 NOTE — Telephone Encounter (Signed)
Patient daughter checking status, call back [phone]

## 2018-01-29 NOTE — Telephone Encounter (Signed)
alprazolam refill Last OV: 11/13/17 Last Refill:10/16/17 #45 tabs 1 RF Pharmacy:[facility] [id number] PCP: Dr. [name]

## 2018-02-17 DIAGNOSIS — H353221 Exudative age-related macular degeneration, left eye, with active choroidal neovascularization: Secondary | ICD-10-CM | POA: Diagnosis not present

## 2018-02-22 ENCOUNTER — Other Ambulatory Visit: Payer: Self-pay | Admitting: Family Medicine

## 2018-02-22 DIAGNOSIS — F411 Generalized anxiety disorder: Secondary | ICD-10-CM

## 2018-03-19 DIAGNOSIS — H43813 Vitreous degeneration, bilateral: Secondary | ICD-10-CM | POA: Diagnosis not present

## 2018-03-19 DIAGNOSIS — H353113 Nonexudative age-related macular degeneration, right eye, advanced atrophic without subfoveal involvement: Secondary | ICD-10-CM | POA: Diagnosis not present

## 2018-03-19 DIAGNOSIS — H353221 Exudative age-related macular degeneration, left eye, with active choroidal neovascularization: Secondary | ICD-10-CM | POA: Diagnosis not present

## 2018-04-12 ENCOUNTER — Ambulatory Visit (INDEPENDENT_AMBULATORY_CARE_PROVIDER_SITE_OTHER): Payer: Medicare Other | Admitting: Internal Medicine

## 2018-04-12 ENCOUNTER — Encounter: Payer: Self-pay | Admitting: Internal Medicine

## 2018-04-12 VITALS — BP 122/82 | HR 71 | Ht 64.0 in | Wt 124.0 lb

## 2018-04-12 DIAGNOSIS — J42 Unspecified chronic bronchitis: Secondary | ICD-10-CM

## 2018-04-12 DIAGNOSIS — J449 Chronic obstructive pulmonary disease, unspecified: Secondary | ICD-10-CM | POA: Diagnosis not present

## 2018-04-12 MED ORDER — ALBUTEROL SULFATE HFA 108 (90 BASE) MCG/ACT IN AERS: 2.0000 | INHALATION_SPRAY | Freq: Four times a day (QID) | RESPIRATORY_TRACT | 0 refills | Status: DC | PRN

## 2018-04-12 MED ORDER — ALBUTEROL SULFATE (2.5 MG/3ML) 0.083% IN NEBU: 2.5000 mg | INHALATION_SOLUTION | RESPIRATORY_TRACT | 12 refills | Status: DC | PRN

## 2018-04-12 NOTE — Progress Notes (Signed)
[facility] Consultation     Date: 04/12/2018,   MRN# [id number] Cambrie Sonnenfeld [facility] 09-08-1926  Former [place] Patient    CHIEF COMPLAINT:   follow up COPD   HISTORY OF PRESENT ILLNESS   84 yo white female former smoker, quit 35 years ago, has dx of COPD unable to take inhaled meds at this time,  prescribed nebulized therapy and seems to helping her breathing No sign signs of infection at this time  PFT's 01/2015 Ratio 54%, Fev1 65%, DLCO 64% TLC 101%, RV 119% Interpretation: moderate COPD with BD response with hyperinflation and air trapping with diffusion impairment  PFT 03/31/2016 Ratio 52% FEV1 72% DLCO 70% TLC 88% RV 90% Interpretation: mild/moderate obstructive disease with BD response  ONO shows hypoxia-oxygen ordered  Doing well today, Has mild productive cough with some wheezing no fevers at this time    Current Medication:   Current Outpatient Medications:  .  albuterol (PROVENTIL) (2.5 MG/3ML) 0.083% nebulizer solution, Take 3 mLs (2.5 mg total) by nebulization every 4 (four) hours as needed for wheezing or shortness of breath. DX: COPD J44.9, Disp: 75 mL, Rfl: 12 .  albuterol (VENTOLIN HFA) 108 (90 Base) MCG/ACT inhaler, Inhale 2 puffs into the lungs every 6 (six) hours as needed for wheezing or shortness of breath. Do not use if using nebs., Disp: 18 Inhaler, Rfl: 0 .  ALPRAZolam (XANAX) 0.25 MG tablet, TAKE 1/2 TO 1 TABLET BY MOUTH TWICE DAILY AS NEEDED. NO MORE THAN 45 TABS PER MONTH, Disp: 45 tablet, Rfl: 1 .  arformoterol (BROVANA) 15 MCG/2ML NEBU, Take 2 mLs (15 mcg total) by nebulization 2 (two) times daily. DX: COPD J44.9, Disp: 120 mL, Rfl: 12 .  aspirin 81 MG tablet, Take 81 mg by mouth daily.  , Disp: , Rfl:  .  azithromycin (ZITHROMAX) 250 MG tablet, Take two the first day then one a day for 5 days total, Disp: 6 each, Rfl: 0 .  budesonide (PULMICORT) 0.5 MG/2ML nebulizer solution, Take 2 mLs (0.5 mg total) by nebulization 2  (two) times daily. DX: COPD J44.9, Disp: 120 mL, Rfl: 12 .  Calcium Carbonate-Vitamin D (CALTRATE 600+D) 600-400 MG-UNIT per tablet, Take 1 tablet by mouth daily.  , Disp: , Rfl:  .  Cholecalciferol (VITAMIN D) 1000 UNITS capsule, Take 1,000 Units by mouth daily.  , Disp: , Rfl:  .  escitalopram (LEXAPRO) 20 MG tablet, TAKE 1 TABLET BY MOUTH EVERY DAY, Disp: 90 tablet, Rfl: 1 .  simvastatin (ZOCOR) 20 MG tablet, Take 1 tablet (20 mg total) by mouth at bedtime., Disp: 90 tablet, Rfl: 3 .  traZODone (DESYREL) 50 MG tablet, TAKE 1 TABLET BY MOUTH EVERYDAY AT BEDTIME, Disp: 90 tablet, Rfl: 1     ALLERGIES   Patient has no known allergies.     REVIEW OF SYSTEMS   Review of Systems  Constitutional: Negative for chills, fever, malaise/fatigue and weight loss.  HENT: Negative for congestion and hearing loss.   Respiratory: Positive for cough and sputum production. Negative for hemoptysis, shortness of breath and wheezing.        Chronic cough and chronic sputum production  Cardiovascular: Negative for chest pain, palpitations, orthopnea and leg swelling.  Gastrointestinal: Negative for heartburn and nausea.  Skin: Negative for rash.  Neurological: Negative for dizziness and headaches.  All other systems reviewed and are negative.  Ht 5\' 4"  (1.626 m)   Wt 124 lb (56.2 kg)   BMI 21.28 kg/m  PHYSICAL EXAM   Physical Exam  Constitutional: She is oriented to person, place, and time. No distress.  Cardiovascular: Normal rate, regular rhythm and normal heart sounds.  No murmur heard. Pulmonary/Chest: Effort normal and breath sounds normal. No stridor. No respiratory distress. She has no wheezes. She has no rales.  Abdominal: Soft.  Neurological: She is alert and oriented to person, place, and time. No cranial nerve deficit.  Skin: Skin is warm. She is not diaphoretic.  Psychiatric: She has a normal mood and affect.       ASSESSMENT/PLAN   84 yo white female with Mild/Moderate  COPD Gold Stage C  With chronic hypoxic resp failure, patient very frail   #1 moderate COPD Seems to be stable at this time I have switched to nebulized therapy 10 months ago due to resp insufficiency -will continue Pulmicort Nebs -will continue LABA neb therapy with Brovana -albuterol Nebs every 4 hrs as needed Doing well with current NEB regimen  #2 chronic hypoxic resp failure On 2L  at night She is benefiting and using at night Continue oxygen as prescribed  #3 AVOID SECOND HAND SMOKE EXPOSURE  Follow up in 6 months    Marquavius Scaife Patricia Pesa, M.D.  [facility]  Medical Director [place] Director [place] [facility]

## 2018-04-12 NOTE — Patient Instructions (Signed)
Continue nebs as prescribed Continue oxygen as prescribed

## 2018-04-14 ENCOUNTER — Other Ambulatory Visit: Payer: Self-pay | Admitting: Family Medicine

## 2018-04-14 DIAGNOSIS — F411 Generalized anxiety disorder: Secondary | ICD-10-CM

## 2018-04-15 NOTE — Telephone Encounter (Signed)
Last OV 11/13/2017   Last refilled 02/01/2018  Disp 45 with 1 refill  Sent to PCP for approval

## 2018-04-26 ENCOUNTER — Ambulatory Visit: Payer: Medicare Other | Admitting: Cardiovascular Disease

## 2018-05-04 DIAGNOSIS — H31093 Other chorioretinal scars, bilateral: Secondary | ICD-10-CM | POA: Diagnosis not present

## 2018-05-04 DIAGNOSIS — H353221 Exudative age-related macular degeneration, left eye, with active choroidal neovascularization: Secondary | ICD-10-CM | POA: Diagnosis not present

## 2018-05-04 DIAGNOSIS — H353113 Nonexudative age-related macular degeneration, right eye, advanced atrophic without subfoveal involvement: Secondary | ICD-10-CM | POA: Diagnosis not present

## 2018-05-04 DIAGNOSIS — H43813 Vitreous degeneration, bilateral: Secondary | ICD-10-CM | POA: Diagnosis not present

## 2018-05-04 NOTE — Progress Notes (Deleted)
Cardiology Office Note  Date:  05/04/2018   ID:  Jessica [name], DOB 01/23/1926, MRN [id number]  PCP:  [name], Jessica G, MD   No chief complaint on file.   HPI:  Jessica [name]  is an 84 y/o woman with  mild to moderate aortic valve stenosis,  h/o HL,  fibromyalgia,  CAD, aortic athero on CT 2016 anxiety  moderate COPD/emphysema,  asthmatic bronchitis Previous smoking history but stopped over 30 years ago.  total knee replacement July 2013 by her report Mild gait instability, walks with a cane.  Previous history of falls with trauma to her face/forehead requiring stitches. She presents for follow-up of her mild to moderate aortic valve disease(by echo 01/2015)  and shortness of breath  She presents today with weakness, worsening cough Family presents with her and reports that cough and congestion is worse than her baseline She has been using nebulizer at home, significant sputum production Coughing in the office today, requiring napkins to catch the sputum Requiring assistance walking around as she is weak  No PND, orthopnea, very minimal ankle swelling left leg family was worried about heart failure  Does not check blood pressure at home Blood pressure low on today's visit No left sidechest pain on exertion, she did have some right-sided chest pain yesterday  EKG personally reviewed by myself on todays visit Shows normal sinus rhythm rate 98 bpm nonspecific ST abnormality  Other past medical history reviewed Still working at [employer] , does not want to retire   she continues to work on Production assistant, radio Using a neb treatment, breathing better, spitting up, thick "stuff" Pulmonary, Dr. Mortimer [name],   significant exposure to secondhand smoke from her daughter   Previous fall Valentine's Day 2017, fractured her right clavicle ( aortic calcification seen on chest x-ray  At that time)  no surgery was performed  difficulty with her memory per the family    said  details of her story over and over on her visit today  Echocardiogram April 2016 showing mild to moderate aortic valve stenosis Carotid ultrasound November 2015 showing mild bilateral carotid disease  Previous trip to the ER with CP in 8/11.  Had post hospital Myoview in 9/11 with normal EF and question of inferior ischemia.   medical therapy pursued at that time. Asymptomatic since then  No recurrent CP or undue dyspnea. Does get fatigued. No palpitations, CHF or syncope.      PMH:   has a past medical history of Acute asthmatic bronchitis, Anxiety, Atypical chest pain, congenital nystagmus, DJD (degenerative joint disease), Fibromyalgia, Low back pain syndrome, Memory loss, Other and unspecified hyperlipidemia, and Venous insufficiency.  PSH:    Past Surgical History:  Procedure Laterality Date  . ABDOMINAL HYSTERECTOMY    . ANTERIOR CERVICAL DISCECTOMY    . CARPAL TUNNEL RELEASE  12/2011   right arm  . CATARACT EXTRACTION    . LUMBAR LAMINECTOMY    . right knee arthroscopy    . right shoulder replacement  04/2009  . right shoulder surgery  2008   Dr. Noemi Chapel    Current Outpatient Medications  Medication Sig Dispense Refill  . albuterol (PROVENTIL) (2.5 MG/3ML) 0.083% nebulizer solution Take 3 mLs (2.5 mg total) by nebulization every 4 (four) hours as needed for wheezing or shortness of breath. DX: COPD J44.9 75 mL 12  . albuterol (VENTOLIN HFA) 108 (90 Base) MCG/ACT inhaler Inhale 2 puffs into the lungs every 6 (six) hours as needed for wheezing or shortness  of breath. Do not use if using nebs. 18 Inhaler 0  . ALPRAZolam (XANAX) 0.25 MG tablet TAKE 1/2 TO 1 TABLET BY MOUTH TWICE DAILY AS NEEDED. NO MORE THAN 45 TABS PER MONTH 40 tablet 0  . arformoterol (BROVANA) 15 MCG/2ML NEBU Take 2 mLs (15 mcg total) by nebulization 2 (two) times daily. DX: COPD J44.9 120 mL 12  . aspirin 81 MG tablet Take 81 mg by mouth daily.      . budesonide (PULMICORT) 0.5 MG/2ML nebulizer solution  Take 2 mLs (0.5 mg total) by nebulization 2 (two) times daily. DX: COPD J44.9 120 mL 12  . Calcium Carbonate-Vitamin D (CALTRATE 600+D) 600-400 MG-UNIT per tablet Take 1 tablet by mouth daily.      . Cholecalciferol (VITAMIN D) 1000 UNITS capsule Take 1,000 Units by mouth daily.      Marland Kitchen escitalopram (LEXAPRO) 20 MG tablet TAKE 1 TABLET BY MOUTH EVERY DAY 90 tablet 1  . simvastatin (ZOCOR) 20 MG tablet Take 1 tablet (20 mg total) by mouth at bedtime. 90 tablet 3  . traZODone (DESYREL) 50 MG tablet TAKE 1 TABLET BY MOUTH EVERYDAY AT BEDTIME 90 tablet 1   No current facility-administered medications for this visit.      Allergies:   Patient has no known allergies.   Social History:  The patient  reports that she quit smoking about 26 years ago. Her smoking use included cigarettes. She has a 60.00 pack-year smoking history. She has never used smokeless tobacco. She reports that she drinks alcohol. She reports that she does not use drugs.   Family History:   Family history is unknown by patient.    Review of Systems: Review of Systems  Respiratory: Positive for cough, sputum production and shortness of breath.   Cardiovascular: Negative.   Gastrointestinal: Negative.   Musculoskeletal: Negative.   Neurological: Positive for weakness.  Psychiatric/Behavioral: Negative.   All other systems reviewed and are negative.    PHYSICAL EXAM: VS:  There were no vitals taken for this visit. , BMI There is no height or weight on file to calculate BMI. GEN: Well nourished, well developed, in no acute distress , frail HEENT: normal  Neck: no JVD, + carotid bruits eft greater than right, no masses Cardiac: RRR; 2/6 SEM RSB with bruit radiating up into the carotids,  no rubs, or gallops,no edema  Respiratory:   Clear, mildly decreased breath sounds throughout,normal work of breathing GI: soft, nontender, nondistended, + BS MS: no deformity or atrophy  Skin: warm and dry, no rash Neuro:  Strength  and sensation are intact Psych: euthymic mood, full affect    Recent Labs: 05/27/2017: BUN 12; Creatinine, Ser 0.67; Potassium 4.3; Sodium 138    Lipid Panel Lab Results  Component Value Date   CHOL 125 05/27/2017   HDL 54.70 05/27/2017   LDLCALC 60 05/27/2017   TRIG 52.0 05/27/2017      Wt Readings from Last 3 Encounters:  04/12/18 124 lb (56.2 kg)  11/13/17 121 lb 2 oz (54.9 kg)  07/09/17 117 lb (53.1 kg)       ASSESSMENT AND PLAN:   Atherosclerosis of native coronary artery of native heart with angina pectoris with documented spasm ([place]) - Plan: EKG 12-Lead Currently with no symptoms of angina. No further workup at this time. Continue current medication regimen.  Mixed hyperlipidemia Cholesterol is at goal on the current lipid regimen. No changes to the medications were made.  Nonrheumatic aortic valve stenosis Aortic valve stenosis,  mild to moderate in April 2016 Repeat echocardiogram ordered  Memory loss Symptoms seem stable, mild on today's visit  Chronic cough Chronically spitting up thick dark-colored mucus Suggested she use her nebulizer on a more regular basis Appears to have acute on chronic bronchitis, Antibiotics as below Will cc pulmonary  COPD exacerbation (HCC) Appears to be having COPD exacerbation, acute on chronic bronchitis Worsening sputum production, weakness on today's visit, hypotension, anorexia Family who presents with her feels it is getting worse Repeat blood pressure 90 systolic, asymptomatic but is weak Recommended she start Z-Pak If no improvement in 10 days, back to her baseline,may need to consider alternate antibiotic  Shortness of breath Acute on chronic bronchitis suggested as above based on hypotension, weakness, worsening chest congestion, cough, sputum  Hypotension/orthostasis Concern for acute infection, acute on chronic bronchitis Encouraged family to provide fluids, lift any salt restriction, start antibiotics,  monitor blood pressure at home If she gets worse, may need to be evaluated in the emergency room All of the above discussed with family Suggested family call us if she is not improving   Total encounter time more than 45 minutes  Greater than 50% was spent in counseling and coordination of care with the patient   Disposition:   F/U  6 months   No orders of the defined types were placed in this encounter.    Signed, Esmond Plants, M.D., Ph.D. 05/04/2018  [place], [place]

## 2018-05-06 ENCOUNTER — Ambulatory Visit: Payer: Medicare Other | Admitting: Cardiovascular Disease

## 2018-05-17 ENCOUNTER — Other Ambulatory Visit: Payer: Self-pay | Admitting: Internal Medicine

## 2018-05-17 DIAGNOSIS — J449 Chronic obstructive pulmonary disease, unspecified: Secondary | ICD-10-CM

## 2018-05-20 DIAGNOSIS — I700 Atherosclerosis of aorta: Secondary | ICD-10-CM | POA: Insufficient documentation

## 2018-05-20 NOTE — Progress Notes (Signed)
Cardiology Office Note  Date:  05/21/2018   ID:  Jessica [name], DOB 1926/01/18, MRN [id number]  PCP:  [name], [name], MD   Chief Complaint  Patient presents with  . other    6 month follow up. Meds reviewed by the pt. verbally. Pt. c/o shortness of breath with over exertion.     HPI:  [name]  is an 84 y/o woman with  mild to moderate aortic valve stenosis,  h/o HL,  fibromyalgia,  CAD, aortic athero on CT 2016 anxiety  moderate COPD/emphysema,  asthmatic bronchitis Previous smoking history but stopped over 30 years ago.  total knee replacement July 2013 by her report Mild gait instability, walks with a cane.  Previous history of falls with trauma to her face/forehead requiring stitches. 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  SOB walking to bathroom Family presents with her and feels the shortness of breath is getting worse Denies any lower extremity edema weight gain abdominal swelling  She has mild weakness but this is stable No PND orthopnea Denies any significant chest pain No orthostasis symptoms  EKG personally reviewed by myself on todays visit Shows normal sinus rhythm rate 82 bpm nonspecific ST abnormality  Other past medical history reviewed Still working at [employer] , does not want to retire   she continues to work on Production assistant, radio Using a neb treatment, breathing better, spitting up, thick "stuff" Pulmonary, Dr. Mortimer Fries,   significant exposure to secondhand smoke from her daughter   Previous fall Valentine's Day 2017, fractured her right clavicle ( aortic calcification seen on chest x-ray  At that time)  no surgery was performed  difficulty with her memory per the family    said details of her story over and over on her visit today  Echocardiogram April  2016 showing mild to moderate aortic valve stenosis Carotid ultrasound November 2015 showing mild bilateral carotid disease  Previous trip to the ER with CP in 8/11.  Had post hospital Myoview in 9/11 with normal EF and question of inferior ischemia.   medical therapy pursued at that time. Asymptomatic since then  No recurrent CP or undue dyspnea. Does get fatigued. No palpitations, CHF or syncope.      PMH:   has a past medical history of Acute asthmatic bronchitis, Anxiety, Atypical chest pain, congenital nystagmus, DJD (degenerative joint disease), Fibromyalgia, Low back pain syndrome, Memory loss, Other and unspecified hyperlipidemia, and Venous insufficiency.  PSH:    Past Surgical History:  Procedure Laterality Date  . ABDOMINAL HYSTERECTOMY    . ANTERIOR CERVICAL DISCECTOMY    . CARPAL TUNNEL RELEASE  12/2011   right arm  . CATARACT EXTRACTION    . LUMBAR LAMINECTOMY    . right knee arthroscopy    . right shoulder replacement  04/2009  . right shoulder surgery  2008   Dr. Noemi Chapel    Current Outpatient Medications  Medication Sig Dispense Refill  . albuterol (PROVENTIL) (2.5 MG/3ML) 0.083% nebulizer solution Take 3 mLs (2.5 mg total) by nebulization every 4 (four) hours as needed for wheezing or shortness of breath. DX: COPD J44.9 75 mL 12  . ALPRAZolam (XANAX) 0.25 MG tablet TAKE 1/2 TO 1 TABLET BY MOUTH TWICE DAILY AS NEEDED. NO MORE THAN 45 TABS PER MONTH 40 tablet 0  . arformoterol (  BROVANA) 15 MCG/2ML NEBU Take 2 mLs (15 mcg total) by nebulization 2 (two) times daily. DX: COPD J44.9 120 mL 12  . aspirin 81 MG tablet Take 81 mg by mouth daily.      . budesonide (PULMICORT) 0.5 MG/2ML nebulizer solution Take 2 mLs (0.5 mg total) by nebulization 2 (two) times daily. DX: COPD J44.9 120 mL 12  . Calcium Carbonate-Vitamin D (CALTRATE 600+D) 600-400 MG-UNIT per tablet Take 1 tablet by mouth daily.      . Cholecalciferol (VITAMIN D) 1000 UNITS capsule Take 1,000 Units by mouth  daily.      Marland Kitchen escitalopram (LEXAPRO) 20 MG tablet TAKE 1 TABLET BY MOUTH EVERY DAY 90 tablet 1  . simvastatin (ZOCOR) 20 MG tablet Take 1 tablet (20 mg total) by mouth at bedtime. 90 tablet 3  . traZODone (DESYREL) 50 MG tablet TAKE 1 TABLET BY MOUTH EVERYDAY AT BEDTIME 90 tablet 1  . VENTOLIN HFA 108 (90 Base) MCG/ACT inhaler PLEASE SEE ATTACHED FOR DETAILED DIRECTIONS 18 Inhaler 0   No current facility-administered medications for this visit.      Allergies:   Patient has no known allergies.   Social History:  The patient  reports that she quit smoking about 26 years ago. Her smoking use included cigarettes. She has a 60.00 pack-year smoking history. She has never used smokeless tobacco. She reports that she drinks alcohol. She reports that she does not use drugs.   Family History:   Family history is unknown by patient.    Review of Systems: Review of Systems  Respiratory: Positive for shortness of breath.   Cardiovascular: Negative.   Gastrointestinal: Negative.   Musculoskeletal: Negative.   Neurological: Positive for weakness.  Psychiatric/Behavioral: Negative.   All other systems reviewed and are negative.    PHYSICAL EXAM: VS:  BP 120/60 (BP Location: Left Arm, Patient Position: Sitting, Cuff Size: Normal)   Pulse 82   Ht 5' (1.524 m)   Wt 127 lb 12 oz (57.9 kg)   BMI 24.95 kg/m  , BMI Body mass index is 24.95 kg/m. Constitutional:  oriented to person, place, and time. No distress. frail HENT:  Head: Normocephalic and atraumatic.  Eyes:  no discharge. No scleral icterus.  Neck: Normal range of motion. Neck supple. No JVD present.  Cardiovascular: 3/6 systolic ejection murmur heard right sternal border Pulmonary/Chest: Effort normal and breath sounds normal. No stridor. No respiratory distress.  no wheezes.  no rales.  no tenderness.  Abdominal: Soft.  no distension.  no tenderness.  Musculoskeletal: Normal range of motion.  no  tenderness or deformity.   Neurological:  normal muscle tone. Coordination normal. No atrophy Skin: Skin is warm and dry. No rash noted. not diaphoretic.  Psychiatric:  normal mood and affect. behavior is normal. Thought content normal.    Recent Labs: 05/27/2017: BUN 12; Creatinine, Ser 0.67; Potassium 4.3; Sodium 138    Lipid Panel Lab Results  Component Value Date   CHOL 125 05/27/2017   HDL 54.70 05/27/2017   LDLCALC 60 05/27/2017   TRIG 52.0 05/27/2017      Wt Readings from Last 3 Encounters:  05/21/18 127 lb 12 oz (57.9 kg)  04/12/18 124 lb (56.2 kg)  11/13/17 121 lb 2 oz (54.9 kg)     ASSESSMENT AND PLAN:   Atherosclerosis of native coronary artery of native heart with angina pectoris with documented spasm ([place]) - Plan: EKG 12-Lead Some shortness of breath, suspect secondary to severe aortic valve stenosis Recommended  echocardiogram followed by cardiac catheterization in preparation for evaluation of her aortic valve  Mixed hyperlipidemia Cholesterol is at goal on the current lipid regimen. No changes to the medications were made. Stable  Nonrheumatic aortic valve stenosis Aortic valve stenosis, mild to moderate in April 2016 Severe on echocardiogram last year Repeat echocardiogram ordered for further visualization and confirmation Recommended to family she would likely need cardiac catheterization following echocardiogram with follow-up in [facility] to discuss options for her aortic valve Suspect given mild memory issues and frail nature age 68 she would be a candidate for TAVR opefully this would help her shortness of breath which is her main complaint  Memory loss/dementia Symptoms seem stable, mild on today's visit  Chronic cough Long history of suspected postnasal drip been spitting into a cup Mild symptoms   COPD exacerbation (HCC) No recent COPD exacerbation Daughter reports her breathing has been stable  Long discussion with patient and daughter concerning aortic valve  disease and need for further workup and procedures  Total encounter time more than 45 minutes  Greater than 50% was spent in counseling and coordination of care with the patient   Disposition:   F/U  3 months   Orders Placed This Encounter  Procedures  . EKG 12-Lead     Signed, Esmond Plants, M.D., Ph.D. 05/21/2018  [place], [place]

## 2018-05-21 ENCOUNTER — Ambulatory Visit (INDEPENDENT_AMBULATORY_CARE_PROVIDER_SITE_OTHER): Payer: Medicare Other | Admitting: Cardiovascular Disease

## 2018-05-21 ENCOUNTER — Encounter: Payer: Self-pay | Admitting: Cardiovascular Disease

## 2018-05-21 ENCOUNTER — Other Ambulatory Visit: Payer: Self-pay | Admitting: Family Medicine

## 2018-05-21 ENCOUNTER — Encounter

## 2018-05-21 VITALS — BP 120/60 | HR 82 | Ht 60.0 in | Wt 127.8 lb

## 2018-05-21 DIAGNOSIS — J441 Chronic obstructive pulmonary disease with (acute) exacerbation: Secondary | ICD-10-CM

## 2018-05-21 DIAGNOSIS — I350 Nonrheumatic aortic (valve) stenosis: Secondary | ICD-10-CM

## 2018-05-21 DIAGNOSIS — I700 Atherosclerosis of aorta: Secondary | ICD-10-CM | POA: Diagnosis not present

## 2018-05-21 DIAGNOSIS — I25118 Atherosclerotic heart disease of native coronary artery with other forms of angina pectoris: Secondary | ICD-10-CM

## 2018-05-21 DIAGNOSIS — R0602 Shortness of breath: Secondary | ICD-10-CM | POA: Diagnosis not present

## 2018-05-21 DIAGNOSIS — G4700 Insomnia, unspecified: Secondary | ICD-10-CM

## 2018-05-21 NOTE — Patient Instructions (Addendum)
Research the TAVR procedure   Medication Instructions:   No medication changes made  Labwork:  No new labs needed  Testing/Procedures:  We will schedule a echocardiogram for severe aortic valve stenosis    Follow-Up: It was a pleasure seeing you in the office today. Please call us if you have new issues that need to be addressed before your next appt.  [phone]  Your physician wants you to follow-up in: 3 months.  You will receive a reminder letter in the mail two months in advance. If you don't receive a letter, please call our office to schedule the follow-up appointment.  If you need a refill on your cardiac medications before your next appointment, please call your pharmacy.  For educational health videos Log in to : [email or web address] Or : [email or web address], [email or web address] : [email or web address]

## 2018-06-10 ENCOUNTER — Other Ambulatory Visit: Payer: Self-pay

## 2018-06-10 ENCOUNTER — Ambulatory Visit (INDEPENDENT_AMBULATORY_CARE_PROVIDER_SITE_OTHER): Payer: Medicare Other

## 2018-06-10 DIAGNOSIS — I350 Nonrheumatic aortic (valve) stenosis: Secondary | ICD-10-CM

## 2018-06-14 ENCOUNTER — Other Ambulatory Visit: Payer: Self-pay | Admitting: Family Medicine

## 2018-06-14 DIAGNOSIS — F411 Generalized anxiety disorder: Secondary | ICD-10-CM

## 2018-06-17 ENCOUNTER — Encounter: Payer: Self-pay | Admitting: Physician Assistant

## 2018-06-22 DIAGNOSIS — H353221 Exudative age-related macular degeneration, left eye, with active choroidal neovascularization: Secondary | ICD-10-CM | POA: Diagnosis not present

## 2018-06-22 DIAGNOSIS — H353213 Exudative age-related macular degeneration, right eye, with inactive scar: Secondary | ICD-10-CM | POA: Diagnosis not present

## 2018-06-22 DIAGNOSIS — H43813 Vitreous degeneration, bilateral: Secondary | ICD-10-CM | POA: Diagnosis not present

## 2018-06-22 DIAGNOSIS — H31093 Other chorioretinal scars, bilateral: Secondary | ICD-10-CM | POA: Diagnosis not present

## 2018-06-23 ENCOUNTER — Other Ambulatory Visit: Payer: Self-pay | Admitting: Family Medicine

## 2018-06-23 ENCOUNTER — Encounter: Payer: Self-pay | Admitting: *Deleted

## 2018-06-23 DIAGNOSIS — G4700 Insomnia, unspecified: Secondary | ICD-10-CM

## 2018-06-24 NOTE — H&P (View-Only) (Signed)
Valve Clinic Consult Note  Chief Complaint  Patient presents with  . New Patient (Initial Visit)    Severe aortic stenosis   History of Present Illness: 84 yo female with history of COPD, fibromyalgia, hyperlipidemia and aortic stenosis who is here today as a new consult, referred by Dr. [name], for further evaluation of her aortic stenosis and discussion regarding TAVR. Her aortic stenosis has been moderate for the last several years. She has been followed by Dr. [name]. She has had recent worsening of her dyspnea. Echo 06/10/18 with LVEF=55-60%, moderate LVH, grade 1 diastolic dysfunction. The aortic valve is thickened with severely thickened leaflets. Mean gradient 49 mmHg, peak gradient 80 mmHg. DVI 0.15. AVA 0.47 cm2. She has COPD. No known CAD.   She tells me today that she has been dyspneic with minimal exertion. No chest pain. No lower extremity edema. No dizziness. She still works in her [employer]. She walks at home. She has a Pharmacist, community but has had recent problems with her lower teeth. She has ongoing issues with several teeth. She lives with her daughter in [place]. Her daughter is with her today.   Primary Care Physician: [name], [name], MD Primary Cardiologist: Esmond Plants Referring Cardiologist: Esmond Plants  Past Medical History:  Diagnosis Date  . Anxiety   . congenital nystagmus   . COPD (chronic obstructive pulmonary disease) ([place])   . DJD (degenerative joint disease)   . Fibromyalgia   . Low back pain syndrome   . Memory loss   . Other and unspecified hyperlipidemia   . Severe aortic stenosis   . Venous insufficiency     Past Surgical History:  Procedure Laterality Date  . ABDOMINAL HYSTERECTOMY    . ANTERIOR CERVICAL DISCECTOMY    . CARPAL TUNNEL RELEASE  12/2011   right arm  . CATARACT EXTRACTION    . LUMBAR LAMINECTOMY    . right knee arthroscopy    . right shoulder replacement  04/2009  . right shoulder surgery  2008   Dr. Noemi Chapel     Current Outpatient Medications  Medication Sig Dispense Refill  . albuterol (PROVENTIL) (2.5 MG/3ML) 0.083% nebulizer solution Take 3 mLs (2.5 mg total) by nebulization every 4 (four) hours as needed for wheezing or shortness of breath. DX: COPD J44.9 75 mL 12  . ALPRAZolam (XANAX) 0.25 MG tablet TAKE 1/2 TO 1 TABLET BY MOUTH TWICE DAILY AS NEEDED. NO MORE THAN 45 TABS PER MONTH 15 tablet 0  . arformoterol (BROVANA) 15 MCG/2ML NEBU Take 2 mLs (15 mcg total) by nebulization 2 (two) times daily. DX: COPD J44.9 120 mL 12  . aspirin 81 MG tablet Take 81 mg by mouth daily.      . budesonide (PULMICORT) 0.5 MG/2ML nebulizer solution Take 2 mLs (0.5 mg total) by nebulization 2 (two) times daily. DX: COPD J44.9 120 mL 12  . Calcium Carbonate-Vitamin D (CALTRATE 600+D) 600-400 MG-UNIT per tablet Take 1 tablet by mouth daily.      . Cholecalciferol (VITAMIN D) 1000 UNITS capsule Take 1,000 Units by mouth daily.      Marland Kitchen escitalopram (LEXAPRO) 20 MG tablet TAKE 1 TABLET BY MOUTH EVERY DAY 90 tablet 1  . simvastatin (ZOCOR) 20 MG tablet Take 1 tablet (20 mg total) by mouth at bedtime. 90 tablet 3  . traZODone (DESYREL) 50 MG tablet TAKE 1 TABLET BY MOUTH EVERYDAY AT BEDTIME 30 tablet 0  . VENTOLIN HFA 108 (90 Base) MCG/ACT inhaler PLEASE SEE ATTACHED FOR DETAILED  DIRECTIONS 18 Inhaler 0   No current facility-administered medications for this visit.     No Known Allergies  Social History   Socioeconomic History  . Marital status: Widowed    Spouse name: Not on file  . Number of children: 2  . Years of education: Not on file  . Highest education level: Not on file  Occupational History  . Occupation: [employer]  Social Needs  . Financial resource strain: Not on file  . Food insecurity:    Worry: Not on file    Inability: Not on file  . Transportation needs:    Medical: Not on file    Non-medical: Not on file  Tobacco Use  . Smoking status: Former Smoker    Packs/day:  2.00    Years: 30.00    Pack years: 60.00    Types: Cigarettes    Last attempt to quit: 10/28/1991    Years since quitting: 26.6  . Smokeless tobacco: Never Used  Substance and Sexual Activity  . Alcohol use: Yes    Comment: 1 drink per night  . Drug use: No  . Sexual activity: Not on file  Lifestyle  . Physical activity:    Days per week: Not on file    Minutes per session: Not on file  . Stress: Not on file  Relationships  . Social connections:    Talks on phone: Not on file    Gets together: Not on file    Attends religious service: Not on file    Active member of club or organization: Not on file    Attends meetings of clubs or organizations: Not on file    Relationship status: Not on file  . Intimate partner violence:    Fear of current or ex partner: Not on file    Emotionally abused: Not on file    Physically abused: Not on file    Forced sexual activity: Not on file  Other Topics Concern  . Not on file  Social History Narrative   1 sibling alive age 58   1 sibling alive age 55   1 sibling deceased age 55   1 sibling alive age 87    Family History  Problem Relation Age of Onset  . Other Mother        broken hip  . Other Father        kidney problems    Review of Systems:  As stated in the HPI and otherwise negative.   BP 140/70   Pulse 70   Ht 5' (1.524 m)   Wt 132 lb 3.2 oz (60 kg)   SpO2 94%   BMI 25.82 kg/m   Physical Examination: General: Well developed, well nourished, NAD  HEENT: OP clear, mucus membranes moist  SKIN: warm, dry. No rashes. Neuro: No focal deficits  Musculoskeletal: Muscle strength 5/5 all ext  Psychiatric: Mood and affect normal  Neck: No JVD, no carotid bruits, no thyromegaly, no lymphadenopathy.  Lungs:Clear bilaterally, no wheezes, rhonci, crackles Cardiovascular: Regular rate and rhythm. Loud, harsh, late peaking systolic murmur.  Abdomen:Soft. Bowel sounds present. Non-tender.  Extremities: No lower extremity edema.  Pulses are 2 + in the bilateral DP/PT.  Echo 06/10/18: - Left ventricle: The cavity size was normal. There was moderate   concentric hypertrophy. Systolic function was normal. The   estimated ejection fraction was in the range of 55% to 60%. Wall   motion was normal; there were no regional wall motion  abnormalities. Doppler parameters are consistent with abnormal   left ventricular relaxation (grade 1 diastolic dysfunction). - Aortic valve: There was severe stenosis. Mean gradient (S): 49 mm   Hg. VTI ratio of LVOT to aortic valve: 0.15. Valve area (VTI):   0.47 cm^2. - Mitral valve: Calcified annulus. - Pulmonary arteries: Systolic pressure was mildly to moderately   increased. PA peak pressure: 45 mm Hg (S).  Impressions:  - Progression of severe aortic stenosis. ------------------------------------------------------------------- Left ventricle:  The cavity size was normal. There was moderate concentric hypertrophy. Systolic function was normal. The estimated ejection fraction was in the range of 55% to 60%. Wall motion was normal; there were no regional wall motion abnormalities. Doppler parameters are consistent with abnormal left ventricular relaxation (grade 1 diastolic dysfunction).  ------------------------------------------------------------------- Aortic valve:   Trileaflet; mildly thickened, severely calcified leaflets.  Doppler:   There was severe stenosis.   There was no regurgitation.    VTI ratio of LVOT to aortic valve: 0.15. Valve area (VTI): 0.47 cm^2. Indexed valve area (VTI): 0.3 cm^2/m^2. Mean velocity ratio of LVOT to aortic valve: 0.17. Valve area (Vmean): 0.55 cm^2. Indexed valve area (Vmean): 0.35 cm^2/m^2.    Mean gradient (S): 49 mm Hg. Peak gradient (S): 80 mm Hg.  ------------------------------------------------------------------- Aorta:  Aortic root: The aortic root was normal in  size.  ------------------------------------------------------------------- Mitral valve:   Calcified annulus. Mobility was not restricted. Doppler:  Transvalvular velocity was within the normal range. There was no evidence for stenosis. There was trivial regurgitation. Valve area by pressure half-time: 1.63 cm^2. Indexed valve area by pressure half-time: 1.03 cm^2/m^2. Valve area by continuity equation (using LVOT flow): 1.7 cm^2. Indexed valve area by continuity equation (using LVOT flow): 1.08 cm^2/m^2.    Mean gradient (D): 2 mm Hg.  ------------------------------------------------------------------- Left atrium:  The atrium was at the upper limits of normal in size.   ------------------------------------------------------------------- Right ventricle:  The cavity size was normal. Wall thickness was normal. Systolic function was normal.  ------------------------------------------------------------------- Pulmonic valve:    Doppler:  Transvalvular velocity was within the normal range. There was no evidence for stenosis.  ------------------------------------------------------------------- Tricuspid valve:   Structurally normal valve.    Doppler: Transvalvular velocity was within the normal range. There was mild regurgitation.  ------------------------------------------------------------------- Pulmonary artery:   The main pulmonary artery was normal-sized. Systolic pressure was mildly to moderately increased.  ------------------------------------------------------------------- Right atrium:  The atrium was normal in size.  ------------------------------------------------------------------- Pericardium:  There was no pericardial effusion.  ------------------------------------------------------------------- Systemic veins: Inferior vena cava: The vessel was normal in size.  ------------------------------------------------------------------- Measurements   Left  ventricle                           Value          Reference  LV ID, ED, PLAX chordal          (L)     41    mm       43 - 52  LV ID, ES, PLAX chordal                  25    mm       23 - 38  LV fx shortening, PLAX chordal           39    %        >=29  LV PW thickness, ED  13    mm       ----------  IVS/LV PW ratio, ED                      1.15           <=1.3  Stroke volume, 2D                        48    ml       ----------  Stroke volume/bsa, 2D                    30    ml/m^2   ----------  LV e&', lateral                           4.57  cm/s     ----------  LV E/e&', lateral                         13.15          ----------  LV e&', medial                            4.68  cm/s     ----------  LV E/e&', medial                          12.84          ----------  LV e&', average                           4.63  cm/s     ----------  LV E/e&', average                         12.99          ----------    Ventricular septum                       Value          Reference  IVS thickness, ED                        15    mm       ----------    LVOT                                     Value          Reference  LVOT ID, S                               20    mm       ----------  LVOT area                                3.14  cm^2     ----------  LVOT ID  20    mm       ----------  LVOT mean velocity, S                    58.9  cm/s     ----------  LVOT VTI, S                              15.4  cm       ----------  Stroke volume (SV), LVOT DP              48.4  ml       ----------  Stroke index (SV/bsa), LVOT DP           30.6  ml/m^2   ----------    Aortic valve                             Value          Reference  Aortic valve peak velocity, S            448   cm/s     ----------  Aortic valve mean velocity, S            337   cm/s     ----------  Aortic valve VTI, S                      102   cm       ----------  Aortic mean gradient, S                   49    mm Hg    ----------  Aortic peak gradient, S                  80    mm Hg    ----------  VTI ratio, LVOT/AV                       0.15           ----------  Aortic valve area, VTI                   0.47  cm^2     ----------  Aortic valve area/bsa, VTI               0.3   cm^2/m^2 ----------  Velocity ratio, mean, LVOT/AV            0.17           ----------  Aortic valve area, mean velocity         0.55  cm^2     ----------  Aortic valve area/bsa, mean              0.35  cm^2/m^2 ----------  velocity    Aorta                                    Value          Reference  Aortic root ID, ED                       34    mm       ----------    Left atrium  Value          Reference  LA ID, A-P, ES                           30    mm       ----------  LA ID/bsa, A-P                           1.9   cm/m^2   <=2.2  LA volume, S                             53.2  ml       ----------  LA volume/bsa, S                         33.7  ml/m^2   ----------  LA volume, ES, 1-p A4C                   37.6  ml       ----------  LA volume/bsa, ES, 1-p A4C               23.8  ml/m^2   ----------  LA volume, ES, 1-p A2C                   62.6  ml       ----------  LA volume/bsa, ES, 1-p A2C               39.6  ml/m^2   ----------    Mitral valve                             Value          Reference  Mitral E-wave peak velocity              60.1  cm/s     ----------  Mitral A-wave peak velocity              112   cm/s     ----------  Mitral mean velocity, D                  61.7  cm/s     ----------  Mitral deceleration time         (H)     496   ms       150 - 230  Mitral pressure half-time                145   ms       ----------  Mitral mean gradient, D                  2     mm Hg    ----------  Mitral E/A ratio, peak                   0.5            ----------  Mitral valve area, PHT, DP               1.63  cm^2     ----------  Mitral valve area/bsa, PHT, DP            1.03  cm^2/m^2 ----------  Mitral valve area, LVOT  1.7   cm^2     ----------  continuity  Mitral valve area/bsa, LVOT              1.08  cm^2/m^2 ----------  continuity  Mitral annulus VTI, D                    28.5  cm       ----------    Pulmonary arteries                       Value          Reference  PA pressure, S, DP               (H)     45    mm Hg    <=30    Tricuspid valve                          Value          Reference  Tricuspid regurg peak velocity           301   cm/s     ----------  Tricuspid peak RV-RA gradient            36    mm Hg    ----------    Right atrium                             Value          Reference  RA ID, S-I, ES, A4C                      45.1  mm       34 - 49  RA area, ES, A4C                         14.2  cm^2     8.3 - 19.5  RA volume, ES, A/L                       35.5  ml       ----------  RA volume/bsa, ES, A/L                   22.5  ml/m^2   ----------    Right ventricle                          Value          Reference  TAPSE                                    25.3  mm       ----------  RV s&', lateral, S                        15.2  cm/s     ----------  EKG:  EKG is ordered today. The ekg ordered today demonstrates NSR, rate 69 bpm. Incomplete RBBB. LVH. Poor wave progression precordial leads.   Recent Labs: No results found for requested labs within last 8760 hours.   Lipid Panel    Component Value Date/Time   CHOL 125 05/27/2017 1052  TRIG 52.0 05/27/2017 1052   HDL 54.70 05/27/2017 1052   CHOLHDL 2 05/27/2017 1052   VLDL 10.4 05/27/2017 1052   LDLCALC 60 05/27/2017 1052   LDLDIRECT 150.0 06/12/2008 0931     Wt Readings from Last 3 Encounters:  06/25/18 132 lb 3.2 oz (60 kg)  05/21/18 127 lb 12 oz (57.9 kg)  04/12/18 124 lb (56.2 kg)     Other studies Reviewed: Additional studies/ records that were reviewed today include: Echo images, EKG, office notes.  Review of the above records demonstrates:  severe AS   Assessment and Plan:   1. Severe aortic stenosis: She has severe, stage D aortic valve stenosis. I have personally reviewed the echo images. The aortic valve is thickened, calcified with limited leaflet mobility. I think she would benefit from AVR. Given advanced age, she is not a good candidate for conventional AVR by surgical approach. I think she may be a good candidate for TAVR. She is very functional for 84 years old. 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   STS Risk Score:  Procedure: Isolated AVR  Risk of Mortality: 5.673%  Renal Failure: 1.734%  Permanent Stroke: 2.140%  Prolonged Ventilation: 10.176%  DSW Infection: 0.101%  Reoperation: 3.740%  Morbidity or Mortality: 15.411%  Short Length of Stay: 17.739%  Long Length of Stay: 9.374%   I have reviewed the natural history of aortic stenosis with the patient and their family members  who are present today. We have discussed the limitations of medical therapy and the poor prognosis associated with symptomatic aortic stenosis. We have reviewed potential treatment options, including palliative medical therapy, conventional surgical aortic valve replacement, and transcatheter aortic valve replacement. We discussed treatment options in the context of the patient's specific comorbid medical conditions.   She would like to proceed with planning for TAVR. I will arrange a right and left heart catheterization at [facility] 07/07/18 at 11:30am. Risks and benefits of the cath procedure and the TAVR procedure reviewed with the patient. After the cath, she will have a cardiac CT, CTA of the chest/abdomen and pelvis, PFTs, carotid dopplers, PT assessment and will then be referred to see one of the CT surgeons on our TAVR team.   Of note, she has several teeth that need to be pulled. She has spoken to her dentist about this. I have written a letter to her dentist today. If he does not wish to  perform the extraction, she will need to be seen by our dental surgeon at [facility].    Current medicines are reviewed at length with the patient today.  The patient does not have concerns regarding medicines.  The following changes have been made:  no change  Labs/ tests ordered today include:   Orders Placed This Encounter  Procedures  . CBC  . Basic Metabolic Panel (BMET)  . EKG 12-Lead    Disposition:   FU with the valve team.   Signed, Lauree Chandler, MD 06/25/2018 9:42 AM    [facility] [address], [place], [place]  [address] Phone: [phone]; Fax: [phone]

## 2018-06-24 NOTE — Progress Notes (Signed)
Valve Clinic Consult Note  Chief Complaint  Patient presents with  . New Patient (Initial Visit)    Severe aortic stenosis   History of Present Illness: 84 yo female with history of COPD, fibromyalgia, hyperlipidemia and aortic stenosis who is here today as a new consult, referred by Dr. [name], for further evaluation of her aortic stenosis and discussion regarding TAVR. Her aortic stenosis has been moderate for the last several years. She has been followed by Dr. [name]. She has had recent worsening of her dyspnea. Echo 06/10/18 with LVEF=55-60%, moderate LVH, grade 1 diastolic dysfunction. The aortic valve is thickened with severely thickened leaflets. Mean gradient 49 mmHg, peak gradient 80 mmHg. DVI 0.15. AVA 0.47 cm2. She has COPD. No known CAD.   She tells me today that she has been dyspneic with minimal exertion. No chest pain. No lower extremity edema. No dizziness. She still works in her [employer]. She walks at home. She has a Pharmacist, community but has had recent problems with her lower teeth. She has ongoing issues with several teeth. She lives with her daughter in [place]. Her daughter is with her today.   Primary Care Physician: [name], [name], MD Primary Cardiologist: Esmond Plants Referring Cardiologist: Esmond Plants  Past Medical History:  Diagnosis Date  . Anxiety   . congenital nystagmus   . COPD (chronic obstructive pulmonary disease) ([place])   . DJD (degenerative joint disease)   . Fibromyalgia   . Low back pain syndrome   . Memory loss   . Other and unspecified hyperlipidemia   . Severe aortic stenosis   . Venous insufficiency     Past Surgical History:  Procedure Laterality Date  . ABDOMINAL HYSTERECTOMY    . ANTERIOR CERVICAL DISCECTOMY    . CARPAL TUNNEL RELEASE  12/2011   right arm  . CATARACT EXTRACTION    . LUMBAR LAMINECTOMY    . right knee arthroscopy    . right shoulder replacement  04/2009  . right shoulder surgery  2008   Dr. Noemi Chapel     Current Outpatient Medications  Medication Sig Dispense Refill  . albuterol (PROVENTIL) (2.5 MG/3ML) 0.083% nebulizer solution Take 3 mLs (2.5 mg total) by nebulization every 4 (four) hours as needed for wheezing or shortness of breath. DX: COPD J44.9 75 mL 12  . ALPRAZolam (XANAX) 0.25 MG tablet TAKE 1/2 TO 1 TABLET BY MOUTH TWICE DAILY AS NEEDED. NO MORE THAN 45 TABS PER MONTH 15 tablet 0  . arformoterol (BROVANA) 15 MCG/2ML NEBU Take 2 mLs (15 mcg total) by nebulization 2 (two) times daily. DX: COPD J44.9 120 mL 12  . aspirin 81 MG tablet Take 81 mg by mouth daily.      . budesonide (PULMICORT) 0.5 MG/2ML nebulizer solution Take 2 mLs (0.5 mg total) by nebulization 2 (two) times daily. DX: COPD J44.9 120 mL 12  . Calcium Carbonate-Vitamin D (CALTRATE 600+D) 600-400 MG-UNIT per tablet Take 1 tablet by mouth daily.      . Cholecalciferol (VITAMIN D) 1000 UNITS capsule Take 1,000 Units by mouth daily.      Marland Kitchen escitalopram (LEXAPRO) 20 MG tablet TAKE 1 TABLET BY MOUTH EVERY DAY 90 tablet 1  . simvastatin (ZOCOR) 20 MG tablet Take 1 tablet (20 mg total) by mouth at bedtime. 90 tablet 3  . traZODone (DESYREL) 50 MG tablet TAKE 1 TABLET BY MOUTH EVERYDAY AT BEDTIME 30 tablet 0  . VENTOLIN HFA 108 (90 Base) MCG/ACT inhaler PLEASE SEE ATTACHED FOR DETAILED  DIRECTIONS 18 Inhaler 0   No current facility-administered medications for this visit.     No Known Allergies  Social History   Socioeconomic History  . Marital status: Widowed    Spouse name: Not on file  . Number of children: 2  . Years of education: Not on file  . Highest education level: Not on file  Occupational History  . Occupation: [employer]  Social Needs  . Financial resource strain: Not on file  . Food insecurity:    Worry: Not on file    Inability: Not on file  . Transportation needs:    Medical: Not on file    Non-medical: Not on file  Tobacco Use  . Smoking status: Former Smoker    Packs/day:  2.00    Years: 30.00    Pack years: 60.00    Types: Cigarettes    Last attempt to quit: 10/28/1991    Years since quitting: 26.6  . Smokeless tobacco: Never Used  Substance and Sexual Activity  . Alcohol use: Yes    Comment: 1 drink per night  . Drug use: No  . Sexual activity: Not on file  Lifestyle  . Physical activity:    Days per week: Not on file    Minutes per session: Not on file  . Stress: Not on file  Relationships  . Social connections:    Talks on phone: Not on file    Gets together: Not on file    Attends religious service: Not on file    Active member of club or organization: Not on file    Attends meetings of clubs or organizations: Not on file    Relationship status: Not on file  . Intimate partner violence:    Fear of current or ex partner: Not on file    Emotionally abused: Not on file    Physically abused: Not on file    Forced sexual activity: Not on file  Other Topics Concern  . Not on file  Social History Narrative   1 sibling alive age 63   1 sibling alive age 68   1 sibling deceased age 39   1 sibling alive age 65    Family History  Problem Relation Age of Onset  . Other Mother        broken hip  . Other Father        kidney problems    Review of Systems:  As stated in the HPI and otherwise negative.   BP 140/70   Pulse 70   Ht 5' (1.524 m)   Wt 132 lb 3.2 oz (60 kg)   SpO2 94%   BMI 25.82 kg/m   Physical Examination: General: Well developed, well nourished, NAD  HEENT: OP clear, mucus membranes moist  SKIN: warm, dry. No rashes. Neuro: No focal deficits  Musculoskeletal: Muscle strength 5/5 all ext  Psychiatric: Mood and affect normal  Neck: No JVD, no carotid bruits, no thyromegaly, no lymphadenopathy.  Lungs:Clear bilaterally, no wheezes, rhonci, crackles Cardiovascular: Regular rate and rhythm. Loud, harsh, late peaking systolic murmur.  Abdomen:Soft. Bowel sounds present. Non-tender.  Extremities: No lower extremity edema.  Pulses are 2 + in the bilateral DP/PT.  Echo 06/10/18: - Left ventricle: The cavity size was normal. There was moderate   concentric hypertrophy. Systolic function was normal. The   estimated ejection fraction was in the range of 55% to 60%. Wall   motion was normal; there were no regional wall motion  abnormalities. Doppler parameters are consistent with abnormal   left ventricular relaxation (grade 1 diastolic dysfunction). - Aortic valve: There was severe stenosis. Mean gradient (S): 49 mm   Hg. VTI ratio of LVOT to aortic valve: 0.15. Valve area (VTI):   0.47 cm^2. - Mitral valve: Calcified annulus. - Pulmonary arteries: Systolic pressure was mildly to moderately   increased. PA peak pressure: 45 mm Hg (S).  Impressions:  - Progression of severe aortic stenosis. ------------------------------------------------------------------- Left ventricle:  The cavity size was normal. There was moderate concentric hypertrophy. Systolic function was normal. The estimated ejection fraction was in the range of 55% to 60%. Wall motion was normal; there were no regional wall motion abnormalities. Doppler parameters are consistent with abnormal left ventricular relaxation (grade 1 diastolic dysfunction).  ------------------------------------------------------------------- Aortic valve:   Trileaflet; mildly thickened, severely calcified leaflets.  Doppler:   There was severe stenosis.   There was no regurgitation.    VTI ratio of LVOT to aortic valve: 0.15. Valve area (VTI): 0.47 cm^2. Indexed valve area (VTI): 0.3 cm^2/m^2. Mean velocity ratio of LVOT to aortic valve: 0.17. Valve area (Vmean): 0.55 cm^2. Indexed valve area (Vmean): 0.35 cm^2/m^2.    Mean gradient (S): 49 mm Hg. Peak gradient (S): 80 mm Hg.  ------------------------------------------------------------------- Aorta:  Aortic root: The aortic root was normal in  size.  ------------------------------------------------------------------- Mitral valve:   Calcified annulus. Mobility was not restricted. Doppler:  Transvalvular velocity was within the normal range. There was no evidence for stenosis. There was trivial regurgitation. Valve area by pressure half-time: 1.63 cm^2. Indexed valve area by pressure half-time: 1.03 cm^2/m^2. Valve area by continuity equation (using LVOT flow): 1.7 cm^2. Indexed valve area by continuity equation (using LVOT flow): 1.08 cm^2/m^2.    Mean gradient (D): 2 mm Hg.  ------------------------------------------------------------------- Left atrium:  The atrium was at the upper limits of normal in size.   ------------------------------------------------------------------- Right ventricle:  The cavity size was normal. Wall thickness was normal. Systolic function was normal.  ------------------------------------------------------------------- Pulmonic valve:    Doppler:  Transvalvular velocity was within the normal range. There was no evidence for stenosis.  ------------------------------------------------------------------- Tricuspid valve:   Structurally normal valve.    Doppler: Transvalvular velocity was within the normal range. There was mild regurgitation.  ------------------------------------------------------------------- Pulmonary artery:   The main pulmonary artery was normal-sized. Systolic pressure was mildly to moderately increased.  ------------------------------------------------------------------- Right atrium:  The atrium was normal in size.  ------------------------------------------------------------------- Pericardium:  There was no pericardial effusion.  ------------------------------------------------------------------- Systemic veins: Inferior vena cava: The vessel was normal in size.  ------------------------------------------------------------------- Measurements   Left  ventricle                           Value          Reference  LV ID, ED, PLAX chordal          (L)     41    mm       43 - 52  LV ID, ES, PLAX chordal                  25    mm       23 - 38  LV fx shortening, PLAX chordal           39    %        >=29  LV PW thickness, ED  13    mm       ----------  IVS/LV PW ratio, ED                      1.15           <=1.3  Stroke volume, 2D                        48    ml       ----------  Stroke volume/bsa, 2D                    30    ml/m^2   ----------  LV e&', lateral                           4.57  cm/s     ----------  LV E/e&', lateral                         13.15          ----------  LV e&', medial                            4.68  cm/s     ----------  LV E/e&', medial                          12.84          ----------  LV e&', average                           4.63  cm/s     ----------  LV E/e&', average                         12.99          ----------    Ventricular septum                       Value          Reference  IVS thickness, ED                        15    mm       ----------    LVOT                                     Value          Reference  LVOT ID, S                               20    mm       ----------  LVOT area                                3.14  cm^2     ----------  LVOT ID  20    mm       ----------  LVOT mean velocity, S                    58.9  cm/s     ----------  LVOT VTI, S                              15.4  cm       ----------  Stroke volume (SV), LVOT DP              48.4  ml       ----------  Stroke index (SV/bsa), LVOT DP           30.6  ml/m^2   ----------    Aortic valve                             Value          Reference  Aortic valve peak velocity, S            448   cm/s     ----------  Aortic valve mean velocity, S            337   cm/s     ----------  Aortic valve VTI, S                      102   cm       ----------  Aortic mean gradient, S                   49    mm Hg    ----------  Aortic peak gradient, S                  80    mm Hg    ----------  VTI ratio, LVOT/AV                       0.15           ----------  Aortic valve area, VTI                   0.47  cm^2     ----------  Aortic valve area/bsa, VTI               0.3   cm^2/m^2 ----------  Velocity ratio, mean, LVOT/AV            0.17           ----------  Aortic valve area, mean velocity         0.55  cm^2     ----------  Aortic valve area/bsa, mean              0.35  cm^2/m^2 ----------  velocity    Aorta                                    Value          Reference  Aortic root ID, ED                       34    mm       ----------    Left atrium  Value          Reference  LA ID, A-P, ES                           30    mm       ----------  LA ID/bsa, A-P                           1.9   cm/m^2   <=2.2  LA volume, S                             53.2  ml       ----------  LA volume/bsa, S                         33.7  ml/m^2   ----------  LA volume, ES, 1-p A4C                   37.6  ml       ----------  LA volume/bsa, ES, 1-p A4C               23.8  ml/m^2   ----------  LA volume, ES, 1-p A2C                   62.6  ml       ----------  LA volume/bsa, ES, 1-p A2C               39.6  ml/m^2   ----------    Mitral valve                             Value          Reference  Mitral E-wave peak velocity              60.1  cm/s     ----------  Mitral A-wave peak velocity              112   cm/s     ----------  Mitral mean velocity, D                  61.7  cm/s     ----------  Mitral deceleration time         (H)     496   ms       150 - 230  Mitral pressure half-time                145   ms       ----------  Mitral mean gradient, D                  2     mm Hg    ----------  Mitral E/A ratio, peak                   0.5            ----------  Mitral valve area, PHT, DP               1.63  cm^2     ----------  Mitral valve area/bsa, PHT, DP            1.03  cm^2/m^2 ----------  Mitral valve area, LVOT  1.7   cm^2     ----------  continuity  Mitral valve area/bsa, LVOT              1.08  cm^2/m^2 ----------  continuity  Mitral annulus VTI, D                    28.5  cm       ----------    Pulmonary arteries                       Value          Reference  PA pressure, S, DP               (H)     45    mm Hg    <=30    Tricuspid valve                          Value          Reference  Tricuspid regurg peak velocity           301   cm/s     ----------  Tricuspid peak RV-RA gradient            36    mm Hg    ----------    Right atrium                             Value          Reference  RA ID, S-I, ES, A4C                      45.1  mm       34 - 49  RA area, ES, A4C                         14.2  cm^2     8.3 - 19.5  RA volume, ES, A/L                       35.5  ml       ----------  RA volume/bsa, ES, A/L                   22.5  ml/m^2   ----------    Right ventricle                          Value          Reference  TAPSE                                    25.3  mm       ----------  RV s&', lateral, S                        15.2  cm/s     ----------  EKG:  EKG is ordered today. The ekg ordered today demonstrates NSR, rate 69 bpm. Incomplete RBBB. LVH. Poor wave progression precordial leads.   Recent Labs: No results found for requested labs within last 8760 hours.   Lipid Panel    Component Value Date/Time   CHOL 125 05/27/2017 1052  TRIG 52.0 05/27/2017 1052   HDL 54.70 05/27/2017 1052   CHOLHDL 2 05/27/2017 1052   VLDL 10.4 05/27/2017 1052   LDLCALC 60 05/27/2017 1052   LDLDIRECT 150.0 06/12/2008 0931     Wt Readings from Last 3 Encounters:  06/25/18 132 lb 3.2 oz (60 kg)  05/21/18 127 lb 12 oz (57.9 kg)  04/12/18 124 lb (56.2 kg)     Other studies Reviewed: Additional studies/ records that were reviewed today include: Echo images, EKG, office notes.  Review of the above records demonstrates:  severe AS   Assessment and Plan:   1. Severe aortic stenosis: She has severe, stage D aortic valve stenosis. I have personally reviewed the echo images. The aortic valve is thickened, calcified with limited leaflet mobility. I think she would benefit from AVR. Given advanced age, she is not a good candidate for conventional AVR by surgical approach. I think she may be a good candidate for TAVR. She is very functional for 84 years old. 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   STS Risk Score:  Procedure: Isolated AVR  Risk of Mortality: 5.673%  Renal Failure: 1.734%  Permanent Stroke: 2.140%  Prolonged Ventilation: 10.176%  DSW Infection: 0.101%  Reoperation: 3.740%  Morbidity or Mortality: 15.411%  Short Length of Stay: 17.739%  Long Length of Stay: 9.374%   I have reviewed the natural history of aortic stenosis with the patient and their family members  who are present today. We have discussed the limitations of medical therapy and the poor prognosis associated with symptomatic aortic stenosis. We have reviewed potential treatment options, including palliative medical therapy, conventional surgical aortic valve replacement, and transcatheter aortic valve replacement. We discussed treatment options in the context of the patient's specific comorbid medical conditions.   She would like to proceed with planning for TAVR. I will arrange a right and left heart catheterization at [facility] 07/07/18 at 11:30am. Risks and benefits of the cath procedure and the TAVR procedure reviewed with the patient. After the cath, she will have a cardiac CT, CTA of the chest/abdomen and pelvis, PFTs, carotid dopplers, PT assessment and will then be referred to see one of the CT surgeons on our TAVR team.   Of note, she has several teeth that need to be pulled. She has spoken to her dentist about this. I have written a letter to her dentist today. If he does not wish to  perform the extraction, she will need to be seen by our dental surgeon at [facility].    Current medicines are reviewed at length with the patient today.  The patient does not have concerns regarding medicines.  The following changes have been made:  no change  Labs/ tests ordered today include:   Orders Placed This Encounter  Procedures  . CBC  . Basic Metabolic Panel (BMET)  . EKG 12-Lead    Disposition:   FU with the valve team.   Signed, Lauree Chandler, MD 06/25/2018 9:42 AM    [facility] [place], [place], [place]  [address] Phone: [phone]; Fax: [phone]

## 2018-06-24 NOTE — H&P (View-Only) (Signed)
Valve Clinic Consult Note  Chief Complaint  Patient presents with  . New Patient (Initial Visit)    Severe aortic stenosis   History of Present Illness: 84 yo female with history of COPD, fibromyalgia, hyperlipidemia and aortic stenosis who is here today as a new consult, referred by Dr. [name], for further evaluation of her aortic stenosis and discussion regarding TAVR. Her aortic stenosis has been moderate for the last several years. She has been followed by Dr. [name]. She has had recent worsening of her dyspnea. Echo 06/10/18 with LVEF=55-60%, moderate LVH, grade 1 diastolic dysfunction. The aortic valve is thickened with severely thickened leaflets. Mean gradient 49 mmHg, peak gradient 80 mmHg. DVI 0.15. AVA 0.47 cm2. She has COPD. No known CAD.   She tells me today that she has been dyspneic with minimal exertion. No chest pain. No lower extremity edema. No dizziness. She still works in her [employer]. She walks at home. She has a Pharmacist, community but has had recent problems with her lower teeth. She has ongoing issues with several teeth. She lives with her daughter in [place]. Her daughter is with her today.   Primary Care Physician: [name], [name], MD Primary Cardiologist: Esmond Plants Referring Cardiologist: Esmond Plants  Past Medical History:  Diagnosis Date  . Anxiety   . congenital nystagmus   . COPD (chronic obstructive pulmonary disease) ([place])   . DJD (degenerative joint disease)   . Fibromyalgia   . Low back pain syndrome   . Memory loss   . Other and unspecified hyperlipidemia   . Severe aortic stenosis   . Venous insufficiency     Past Surgical History:  Procedure Laterality Date  . ABDOMINAL HYSTERECTOMY    . ANTERIOR CERVICAL DISCECTOMY    . CARPAL TUNNEL RELEASE  12/2011   right arm  . CATARACT EXTRACTION    . LUMBAR LAMINECTOMY    . right knee arthroscopy    . right shoulder replacement  04/2009  . right shoulder surgery  2008   Dr. Noemi Chapel     Current Outpatient Medications  Medication Sig Dispense Refill  . albuterol (PROVENTIL) (2.5 MG/3ML) 0.083% nebulizer solution Take 3 mLs (2.5 mg total) by nebulization every 4 (four) hours as needed for wheezing or shortness of breath. DX: COPD J44.9 75 mL 12  . ALPRAZolam (XANAX) 0.25 MG tablet TAKE 1/2 TO 1 TABLET BY MOUTH TWICE DAILY AS NEEDED. NO MORE THAN 45 TABS PER MONTH 15 tablet 0  . arformoterol (BROVANA) 15 MCG/2ML NEBU Take 2 mLs (15 mcg total) by nebulization 2 (two) times daily. DX: COPD J44.9 120 mL 12  . aspirin 81 MG tablet Take 81 mg by mouth daily.      . budesonide (PULMICORT) 0.5 MG/2ML nebulizer solution Take 2 mLs (0.5 mg total) by nebulization 2 (two) times daily. DX: COPD J44.9 120 mL 12  . Calcium Carbonate-Vitamin D (CALTRATE 600+D) 600-400 MG-UNIT per tablet Take 1 tablet by mouth daily.      . Cholecalciferol (VITAMIN D) 1000 UNITS capsule Take 1,000 Units by mouth daily.      Marland Kitchen escitalopram (LEXAPRO) 20 MG tablet TAKE 1 TABLET BY MOUTH EVERY DAY 90 tablet 1  . simvastatin (ZOCOR) 20 MG tablet Take 1 tablet (20 mg total) by mouth at bedtime. 90 tablet 3  . traZODone (DESYREL) 50 MG tablet TAKE 1 TABLET BY MOUTH EVERYDAY AT BEDTIME 30 tablet 0  . VENTOLIN HFA 108 (90 Base) MCG/ACT inhaler PLEASE SEE ATTACHED FOR DETAILED  DIRECTIONS 18 Inhaler 0   No current facility-administered medications for this visit.     No Known Allergies  Social History   Socioeconomic History  . Marital status: Widowed    Spouse name: Not on file  . Number of children: 2  . Years of education: Not on file  . Highest education level: Not on file  Occupational History  . Occupation: [employer]  Social Needs  . Financial resource strain: Not on file  . Food insecurity:    Worry: Not on file    Inability: Not on file  . Transportation needs:    Medical: Not on file    Non-medical: Not on file  Tobacco Use  . Smoking status: Former Smoker    Packs/day:  2.00    Years: 30.00    Pack years: 60.00    Types: Cigarettes    Last attempt to quit: 10/28/1991    Years since quitting: 26.6  . Smokeless tobacco: Never Used  Substance and Sexual Activity  . Alcohol use: Yes    Comment: 1 drink per night  . Drug use: No  . Sexual activity: Not on file  Lifestyle  . Physical activity:    Days per week: Not on file    Minutes per session: Not on file  . Stress: Not on file  Relationships  . Social connections:    Talks on phone: Not on file    Gets together: Not on file    Attends religious service: Not on file    Active member of club or organization: Not on file    Attends meetings of clubs or organizations: Not on file    Relationship status: Not on file  . Intimate partner violence:    Fear of current or ex partner: Not on file    Emotionally abused: Not on file    Physically abused: Not on file    Forced sexual activity: Not on file  Other Topics Concern  . Not on file  Social History Narrative   1 sibling alive age 26   1 sibling alive age 57   1 sibling deceased age 13   1 sibling alive age 8    Family History  Problem Relation Age of Onset  . Other Mother        broken hip  . Other Father        kidney problems    Review of Systems:  As stated in the HPI and otherwise negative.   BP 140/70   Pulse 70   Ht 5' (1.524 m)   Wt 132 lb 3.2 oz (60 kg)   SpO2 94%   BMI 25.82 kg/m   Physical Examination: General: Well developed, well nourished, NAD  HEENT: OP clear, mucus membranes moist  SKIN: warm, dry. No rashes. Neuro: No focal deficits  Musculoskeletal: Muscle strength 5/5 all ext  Psychiatric: Mood and affect normal  Neck: No JVD, no carotid bruits, no thyromegaly, no lymphadenopathy.  Lungs:Clear bilaterally, no wheezes, rhonci, crackles Cardiovascular: Regular rate and rhythm. Loud, harsh, late peaking systolic murmur.  Abdomen:Soft. Bowel sounds present. Non-tender.  Extremities: No lower extremity edema.  Pulses are 2 + in the bilateral DP/PT.  Echo 06/10/18: - Left ventricle: The cavity size was normal. There was moderate   concentric hypertrophy. Systolic function was normal. The   estimated ejection fraction was in the range of 55% to 60%. Wall   motion was normal; there were no regional wall motion  abnormalities. Doppler parameters are consistent with abnormal   left ventricular relaxation (grade 1 diastolic dysfunction). - Aortic valve: There was severe stenosis. Mean gradient (S): 49 mm   Hg. VTI ratio of LVOT to aortic valve: 0.15. Valve area (VTI):   0.47 cm^2. - Mitral valve: Calcified annulus. - Pulmonary arteries: Systolic pressure was mildly to moderately   increased. PA peak pressure: 45 mm Hg (S).  Impressions:  - Progression of severe aortic stenosis. ------------------------------------------------------------------- Left ventricle:  The cavity size was normal. There was moderate concentric hypertrophy. Systolic function was normal. The estimated ejection fraction was in the range of 55% to 60%. Wall motion was normal; there were no regional wall motion abnormalities. Doppler parameters are consistent with abnormal left ventricular relaxation (grade 1 diastolic dysfunction).  ------------------------------------------------------------------- Aortic valve:   Trileaflet; mildly thickened, severely calcified leaflets.  Doppler:   There was severe stenosis.   There was no regurgitation.    VTI ratio of LVOT to aortic valve: 0.15. Valve area (VTI): 0.47 cm^2. Indexed valve area (VTI): 0.3 cm^2/m^2. Mean velocity ratio of LVOT to aortic valve: 0.17. Valve area (Vmean): 0.55 cm^2. Indexed valve area (Vmean): 0.35 cm^2/m^2.    Mean gradient (S): 49 mm Hg. Peak gradient (S): 80 mm Hg.  ------------------------------------------------------------------- Aorta:  Aortic root: The aortic root was normal in  size.  ------------------------------------------------------------------- Mitral valve:   Calcified annulus. Mobility was not restricted. Doppler:  Transvalvular velocity was within the normal range. There was no evidence for stenosis. There was trivial regurgitation. Valve area by pressure half-time: 1.63 cm^2. Indexed valve area by pressure half-time: 1.03 cm^2/m^2. Valve area by continuity equation (using LVOT flow): 1.7 cm^2. Indexed valve area by continuity equation (using LVOT flow): 1.08 cm^2/m^2.    Mean gradient (D): 2 mm Hg.  ------------------------------------------------------------------- Left atrium:  The atrium was at the upper limits of normal in size.   ------------------------------------------------------------------- Right ventricle:  The cavity size was normal. Wall thickness was normal. Systolic function was normal.  ------------------------------------------------------------------- Pulmonic valve:    Doppler:  Transvalvular velocity was within the normal range. There was no evidence for stenosis.  ------------------------------------------------------------------- Tricuspid valve:   Structurally normal valve.    Doppler: Transvalvular velocity was within the normal range. There was mild regurgitation.  ------------------------------------------------------------------- Pulmonary artery:   The main pulmonary artery was normal-sized. Systolic pressure was mildly to moderately increased.  ------------------------------------------------------------------- Right atrium:  The atrium was normal in size.  ------------------------------------------------------------------- Pericardium:  There was no pericardial effusion.  ------------------------------------------------------------------- Systemic veins: Inferior vena cava: The vessel was normal in size.  ------------------------------------------------------------------- Measurements   Left  ventricle                           Value          Reference  LV ID, ED, PLAX chordal          (L)     41    mm       43 - 52  LV ID, ES, PLAX chordal                  25    mm       23 - 38  LV fx shortening, PLAX chordal           39    %        >=29  LV PW thickness, ED  13    mm       ----------  IVS/LV PW ratio, ED                      1.15           <=1.3  Stroke volume, 2D                        48    ml       ----------  Stroke volume/bsa, 2D                    30    ml/m^2   ----------  LV e&', lateral                           4.57  cm/s     ----------  LV E/e&', lateral                         13.15          ----------  LV e&', medial                            4.68  cm/s     ----------  LV E/e&', medial                          12.84          ----------  LV e&', average                           4.63  cm/s     ----------  LV E/e&', average                         12.99          ----------    Ventricular septum                       Value          Reference  IVS thickness, ED                        15    mm       ----------    LVOT                                     Value          Reference  LVOT ID, S                               20    mm       ----------  LVOT area                                3.14  cm^2     ----------  LVOT ID  20    mm       ----------  LVOT mean velocity, S                    58.9  cm/s     ----------  LVOT VTI, S                              15.4  cm       ----------  Stroke volume (SV), LVOT DP              48.4  ml       ----------  Stroke index (SV/bsa), LVOT DP           30.6  ml/m^2   ----------    Aortic valve                             Value          Reference  Aortic valve peak velocity, S            448   cm/s     ----------  Aortic valve mean velocity, S            337   cm/s     ----------  Aortic valve VTI, S                      102   cm       ----------  Aortic mean gradient, S                   49    mm Hg    ----------  Aortic peak gradient, S                  80    mm Hg    ----------  VTI ratio, LVOT/AV                       0.15           ----------  Aortic valve area, VTI                   0.47  cm^2     ----------  Aortic valve area/bsa, VTI               0.3   cm^2/m^2 ----------  Velocity ratio, mean, LVOT/AV            0.17           ----------  Aortic valve area, mean velocity         0.55  cm^2     ----------  Aortic valve area/bsa, mean              0.35  cm^2/m^2 ----------  velocity    Aorta                                    Value          Reference  Aortic root ID, ED                       34    mm       ----------    Left atrium  Value          Reference  LA ID, A-P, ES                           30    mm       ----------  LA ID/bsa, A-P                           1.9   cm/m^2   <=2.2  LA volume, S                             53.2  ml       ----------  LA volume/bsa, S                         33.7  ml/m^2   ----------  LA volume, ES, 1-p A4C                   37.6  ml       ----------  LA volume/bsa, ES, 1-p A4C               23.8  ml/m^2   ----------  LA volume, ES, 1-p A2C                   62.6  ml       ----------  LA volume/bsa, ES, 1-p A2C               39.6  ml/m^2   ----------    Mitral valve                             Value          Reference  Mitral E-wave peak velocity              60.1  cm/s     ----------  Mitral A-wave peak velocity              112   cm/s     ----------  Mitral mean velocity, D                  61.7  cm/s     ----------  Mitral deceleration time         (H)     496   ms       150 - 230  Mitral pressure half-time                145   ms       ----------  Mitral mean gradient, D                  2     mm Hg    ----------  Mitral E/A ratio, peak                   0.5            ----------  Mitral valve area, PHT, DP               1.63  cm^2     ----------  Mitral valve area/bsa, PHT, DP            1.03  cm^2/m^2 ----------  Mitral valve area, LVOT  1.7   cm^2     ----------  continuity  Mitral valve area/bsa, LVOT              1.08  cm^2/m^2 ----------  continuity  Mitral annulus VTI, D                    28.5  cm       ----------    Pulmonary arteries                       Value          Reference  PA pressure, S, DP               (H)     45    mm Hg    <=30    Tricuspid valve                          Value          Reference  Tricuspid regurg peak velocity           301   cm/s     ----------  Tricuspid peak RV-RA gradient            36    mm Hg    ----------    Right atrium                             Value          Reference  RA ID, S-I, ES, A4C                      45.1  mm       34 - 49  RA area, ES, A4C                         14.2  cm^2     8.3 - 19.5  RA volume, ES, A/L                       35.5  ml       ----------  RA volume/bsa, ES, A/L                   22.5  ml/m^2   ----------    Right ventricle                          Value          Reference  TAPSE                                    25.3  mm       ----------  RV s&', lateral, S                        15.2  cm/s     ----------  EKG:  EKG is ordered today. The ekg ordered today demonstrates NSR, rate 69 bpm. Incomplete RBBB. LVH. Poor wave progression precordial leads.   Recent Labs: No results found for requested labs within last 8760 hours.   Lipid Panel    Component Value Date/Time   CHOL 125 05/27/2017 1052  TRIG 52.0 05/27/2017 1052   HDL 54.70 05/27/2017 1052   CHOLHDL 2 05/27/2017 1052   VLDL 10.4 05/27/2017 1052   LDLCALC 60 05/27/2017 1052   LDLDIRECT 150.0 06/12/2008 0931     Wt Readings from Last 3 Encounters:  06/25/18 132 lb 3.2 oz (60 kg)  05/21/18 127 lb 12 oz (57.9 kg)  04/12/18 124 lb (56.2 kg)     Other studies Reviewed: Additional studies/ records that were reviewed today include: Echo images, EKG, office notes.  Review of the above records demonstrates:  severe AS   Assessment and Plan:   1. Severe aortic stenosis: She has severe, stage D aortic valve stenosis. I have personally reviewed the echo images. The aortic valve is thickened, calcified with limited leaflet mobility. I think she would benefit from AVR. Given advanced age, she is not a good candidate for conventional AVR by surgical approach. I think she may be a good candidate for TAVR. She is very functional for 84 years old. 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   STS Risk Score:  Procedure: Isolated AVR  Risk of Mortality: 5.673%  Renal Failure: 1.734%  Permanent Stroke: 2.140%  Prolonged Ventilation: 10.176%  DSW Infection: 0.101%  Reoperation: 3.740%  Morbidity or Mortality: 15.411%  Short Length of Stay: 17.739%  Long Length of Stay: 9.374%   I have reviewed the natural history of aortic stenosis with the patient and their family members  who are present today. We have discussed the limitations of medical therapy and the poor prognosis associated with symptomatic aortic stenosis. We have reviewed potential treatment options, including palliative medical therapy, conventional surgical aortic valve replacement, and transcatheter aortic valve replacement. We discussed treatment options in the context of the patient's specific comorbid medical conditions.   She would like to proceed with planning for TAVR. I will arrange a right and left heart catheterization at [facility] 07[facility]07[facility]18 at 11:30am. Risks and benefits of the cath procedure and the TAVR procedure reviewed with the patient. After the cath, she will have a cardiac CT, CTA of the chest/abdomen and pelvis, PFTs, carotid dopplers, PT assessment and will then be referred to see one of the CT surgeons on our TAVR team.   Of note, she has several teeth that need to be pulled. She has spoken to her dentist about this. I have written a letter to her dentist today. If he does not wish to  perform the extraction, she will need to be seen by our dental surgeon at [facility].    Current medicines are reviewed at length with the patient today.  The patient does not have concerns regarding medicines.  The following changes have been made:  no change  Labs/ tests ordered today include:   Orders Placed This Encounter  Procedures  . CBC  . Basic Metabolic Panel (BMET)  . EKG 12-Lead    Disposition:   FU with the valve team.   Signed, Lauree Chandler, MD 06/25/2018 9:42 AM    [facility] [address], [place], [place]  [address] Phone: [phone]; Fax: [phone]

## 2018-06-25 ENCOUNTER — Encounter: Payer: Self-pay | Admitting: Cardiovascular Disease

## 2018-06-25 ENCOUNTER — Ambulatory Visit (INDEPENDENT_AMBULATORY_CARE_PROVIDER_SITE_OTHER): Payer: Medicare Other | Admitting: Cardiovascular Disease

## 2018-06-25 VITALS — BP 140/70 | HR 70 | Ht 60.0 in | Wt 132.2 lb

## 2018-06-25 DIAGNOSIS — I350 Nonrheumatic aortic (valve) stenosis: Secondary | ICD-10-CM

## 2018-06-25 DIAGNOSIS — I25118 Atherosclerotic heart disease of native coronary artery with other forms of angina pectoris: Secondary | ICD-10-CM

## 2018-06-25 LAB — BASIC METABOLIC PANEL
BUN/Creatinine Ratio: 18 (ref 12–28)
BUN: 14 mg/dL (ref 10–36)
CHLORIDE: 100 mmol/L (ref 96–106)
CO2: 27 mmol/L (ref 20–29)
Calcium: 9.7 mg/dL (ref 8.7–10.3)
Creatinine, Ser: 0.79 mg/dL (ref 0.57–1.00)
GFR calc Af Amer: 76 mL/min/{1.73_m2} (ref >59)
GFR calc non Af Amer: 66 mL/min/{1.73_m2} (ref >59)
GLUCOSE: 80 mg/dL (ref 65–99)
POTASSIUM: 4.7 mmol/L (ref 3.5–5.2)
SODIUM: 140 mmol/L (ref 134–144)

## 2018-06-25 LAB — CBC
Hematocrit: 37.4 % (ref 34.0–46.6)
Hemoglobin: 12.2 g/dL (ref 11.1–15.9)
MCH: 30 pg (ref 26.6–33.0)
MCHC: 32.6 g/dL (ref 31.5–35.7)
MCV: 92 fL (ref 79–97)
Platelets: 228 10*3/uL (ref 150–450)
RBC: 4.06 x10E6/uL (ref 3.77–5.28)
RDW: 14.3 % (ref 12.3–15.4)
WBC: 4.5 10*3/uL (ref 3.4–10.8)

## 2018-06-25 NOTE — Patient Instructions (Signed)
Medication Instructions:  Your physician recommends that you continue on your current medications as directed. Please refer to the Current Medication list given to you today.   Labwork: Lab work to be done today--BMP and CBC  Testing/Procedures: Your physician has requested that you have a cardiac catheterization. Cardiac catheterization is used to diagnose and/or treat various heart conditions. Doctors may recommend this procedure for a number of different reasons. The most common reason is to evaluate chest pain. Chest pain can be a symptom of coronary artery disease (CAD), and cardiac catheterization can show whether plaque is narrowing or blocking your heart's arteries. This procedure is also used to evaluate the valves, as well as measure the blood flow and oxygen levels in different parts of your heart. For further information please visit [email or web address]. See instructions below.  Scheduled for September 11,2019  Follow-Up: To be arranged after catheterization  Any Other Special Instructions Will Be Listed Below (If Applicable).     [facility] [address], [address] [place] [address] Dept: [phone] Loc: [facility]  06/25/2018  You are scheduled for a Cardiac Catheterization on Wednesday, September 11 with Dr. Lauree Chandler.  1. Please arrive at the [facility] (Main Entrance A) at [facility]: [address] [place], [place] [address] at 9:30 AM (This time is two hours before your procedure to ensure your preparation). Free valet parking service is available.   Special note: Every effort is made to have your procedure done on time. Please understand that emergencies sometimes delay scheduled procedures.  2. Diet: Do not eat solid foods after midnight.  The patient may have clear liquids until 5am upon the day of the procedure.  3. Labs: Lab  work was done in office on August 30,2019  4. Medication instructions in preparation for your procedure:   Contrast Allergy: No   On the morning of your procedure, take your Aspirin and any morning medicines NOT listed above.  You may use sips of water.  5. Plan for one night stay--bring personal belongings. 6. Bring a current list of your medications and current insurance cards. 7. You MUST have a responsible person to drive you home. 8. Someone MUST be with you the first 24 hours after you arrive home or your discharge will be delayed. 9. Please wear clothes that are easy to get on and off and wear slip-on shoes.  Thank you for allowing [name] to care for you!   --  Invasive Cardiovascular services    If you need a refill on your cardiac medications before your next appointment, please call your pharmacy.

## 2018-06-29 ENCOUNTER — Other Ambulatory Visit: Payer: Self-pay

## 2018-06-29 DIAGNOSIS — I350 Nonrheumatic aortic (valve) stenosis: Secondary | ICD-10-CM

## 2018-06-30 ENCOUNTER — Encounter: Payer: Self-pay | Admitting: Family Medicine

## 2018-06-30 ENCOUNTER — Other Ambulatory Visit: Payer: Self-pay

## 2018-06-30 ENCOUNTER — Ambulatory Visit (INDEPENDENT_AMBULATORY_CARE_PROVIDER_SITE_OTHER): Payer: Medicare Other | Admitting: Family Medicine

## 2018-06-30 VITALS — BP 124/76 | HR 92 | Temp 98.1°F | Resp 16 | Ht 60.0 in | Wt 131.4 lb

## 2018-06-30 DIAGNOSIS — I350 Nonrheumatic aortic (valve) stenosis: Secondary | ICD-10-CM | POA: Diagnosis not present

## 2018-06-30 DIAGNOSIS — I25118 Atherosclerotic heart disease of native coronary artery with other forms of angina pectoris: Secondary | ICD-10-CM

## 2018-06-30 DIAGNOSIS — G4700 Insomnia, unspecified: Secondary | ICD-10-CM

## 2018-06-30 DIAGNOSIS — Z23 Encounter for immunization: Secondary | ICD-10-CM

## 2018-06-30 DIAGNOSIS — R110 Nausea: Secondary | ICD-10-CM

## 2018-06-30 DIAGNOSIS — F411 Generalized anxiety disorder: Secondary | ICD-10-CM | POA: Diagnosis not present

## 2018-06-30 DIAGNOSIS — R0609 Other forms of dyspnea: Principal | ICD-10-CM

## 2018-06-30 MED ORDER — ESCITALOPRAM OXALATE 20 MG PO TABS: 20.0000 mg | ORAL_TABLET | Freq: Every day | ORAL | 1 refills | Status: DC

## 2018-06-30 MED ORDER — TRAZODONE HCL 50 MG PO TABS: 25.0000 mg | ORAL_TABLET | Freq: Every evening | ORAL | 0 refills | Status: DC | PRN

## 2018-06-30 MED ORDER — ALPRAZOLAM 0.25 MG PO TABS: ORAL_TABLET | ORAL | 3 refills | Status: DC
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
## 2018-06-30 NOTE — Assessment & Plan Note (Signed)
Problem is overall stable. For now we will not change Lexapro 20 mg oral Xanax 0.25 mg twice daily as needed. We discussed side effects of medications as well as the risk of interaction with trazodone. Instructed about warning signs. Follow-up in 6 months, before if needed.

## 2018-06-30 NOTE — Patient Instructions (Addendum)
A few things to remember from today's visit:   Insomnia, unspecified type  Vitamin D deficiency  Generalized anxiety disorder  Nonrheumatic aortic valve stenosis  No changes in medications today. Fall precautions. I will see you back in 6 months.  Please be sure medication list is accurate. If a new problem present, please set up appointment sooner than planned today.

## 2018-06-30 NOTE — Assessment & Plan Note (Signed)
Problem seems to be well controlled. No changes in trazodone 50 mg at bedtime. Good sleep hygiene. We discussed side effects of medication. Follow-up in 6 months.

## 2018-06-30 NOTE — Assessment & Plan Note (Signed)
Symptoms getting worse. Recommend resting, avoiding exertion, at least until TAVR is done. Instructed about warning signs. Continue following with cardiologist.

## 2018-07-02 ENCOUNTER — Telehealth: Payer: Self-pay | Admitting: Internal Medicine

## 2018-07-02 DIAGNOSIS — J449 Chronic obstructive pulmonary disease, unspecified: Secondary | ICD-10-CM

## 2018-07-02 MED ORDER — ALBUTEROL SULFATE HFA 108 (90 BASE) MCG/ACT IN AERS: 2.0000 | INHALATION_SPRAY | Freq: Four times a day (QID) | RESPIRATORY_TRACT | 2 refills | Status: DC | PRN

## 2018-07-02 NOTE — Telephone Encounter (Signed)
[facility] pharmacy calling they received a prescription for Ventolin   Need a call back on how patient is to take this  Please call back

## 2018-07-02 NOTE — Telephone Encounter (Signed)
Previous rx did not have directions. New Rx sent 2 puffs every 6 hrs prn. Nothing further needed.

## 2018-07-05 ENCOUNTER — Ambulatory Visit (HOSPITAL_COMMUNITY): Payer: Self-pay | Admitting: Dentistry

## 2018-07-05 ENCOUNTER — Encounter (HOSPITAL_COMMUNITY): Payer: Self-pay | Admitting: Dentistry

## 2018-07-05 VITALS — BP 167/56 | HR 67 | Temp 98.3°F

## 2018-07-05 DIAGNOSIS — K0889 Other specified disorders of teeth and supporting structures: Secondary | ICD-10-CM

## 2018-07-05 DIAGNOSIS — K036 Deposits [accretions] on teeth: Secondary | ICD-10-CM

## 2018-07-05 DIAGNOSIS — K08409 Partial loss of teeth, unspecified cause, unspecified class: Secondary | ICD-10-CM

## 2018-07-05 DIAGNOSIS — I350 Nonrheumatic aortic (valve) stenosis: Secondary | ICD-10-CM | POA: Diagnosis not present

## 2018-07-05 DIAGNOSIS — M270 Developmental disorders of jaws: Secondary | ICD-10-CM

## 2018-07-05 DIAGNOSIS — Z01818 Encounter for other preprocedural examination: Secondary | ICD-10-CM | POA: Diagnosis not present

## 2018-07-05 DIAGNOSIS — M264 Malocclusion, unspecified: Secondary | ICD-10-CM

## 2018-07-05 DIAGNOSIS — K0530 Chronic periodontitis, unspecified: Secondary | ICD-10-CM

## 2018-07-05 DIAGNOSIS — K029 Dental caries, unspecified: Secondary | ICD-10-CM

## 2018-07-05 DIAGNOSIS — K06010 Localized gingival recession, unspecified: Secondary | ICD-10-CM

## 2018-07-05 DIAGNOSIS — K083 Retained dental root: Secondary | ICD-10-CM

## 2018-07-05 NOTE — Progress Notes (Signed)
DENTAL CONSULTATION  Date of Consultation:  07/05/2018 Patient Name:   Jessica [name] [facility] Date of Birth:   1926-01-10 Medical Record Number: [id number]  VITALS: BP (!) 167/56 (BP Location: Right Arm)   Pulse 67   Temp 98.3 F (36.8 C)   CHIEF COMPLAINT: Patient referred by Dr. [name] for dental consultation.  HPI: Jessica [name] Is a 84 year old female referred by Dr. [name] for dental consultation. Patient was recently diagnosed with severe aortic stenosis with anticipated aortic valve replacement. Patient is now seen as part of a pre-heart valve surgery dental protocol to rule out dental infection that may affect the patient's systemic health and anticipated heart valve surgery.  The patient currently denies acute toothaches, swellings, or abscesses. The patient, however, knows that she has "many bad teeth". Patient was recently seen approximately 3 weeks ago by Dr. Adair Laundry in [place], [place] for an exam and radiographs. The patient was being considered for multiple extractions with alveoloplasty along with crown and bridge therapy from tooth numbers 22 through 28.  Prior to that, the patient had been seen by [facility] for fabrication of an upper complete denture in June of 2019.  Patient indicates that the upper denture "fits good"l. Patient denies having a lower partial denture. Patient denies having dental phobia.  PROBLEM LIST: Patient Active Problem List   Diagnosis Date Noted  . Aortic valve stenosis 02/26/2015    Priority: High  . Aortic atherosclerosis ([place]) 05/20/2018  . Insomnia 04/08/2016  . Chronic bronchitis ([place]) 03/10/2016  . COPD (chronic obstructive pulmonary disease) ([place]) 05/07/2015  . Labial cyst 05/07/2015  . Shortness of breath 02/26/2015  . Constipation 01/24/2015  . COPD exacerbation ([place]) 12/20/2014  . Chronic cough 09/06/2014  . Carotid bruit 09/06/2014  . Murmur 11/08/2012  . PULMONARY NODULE 01/06/2011  . CAD, NATIVE VESSEL 11/19/2010   . Vitamin D deficiency 01/14/2009  . Hyperlipidemia 01/14/2009  . DEGENERATIVE JOINT DISEASE 06/12/2008  . MEMORY LOSS 12/19/2007  . CONGENITAL NYSTAGMUS 12/15/2007  . VENOUS INSUFFICIENCY 12/15/2007  . LOW BACK PAIN SYNDROME 12/15/2007  . FIBROMYALGIA 12/15/2007  . CHEST PAIN UNSPECIFIED 12/15/2007  . Generalized anxiety disorder 12/14/2007  . VERTIGO 12/14/2007  . UTI'S, HX OF 12/14/2007    PMH: Past Medical History:  Diagnosis Date  . Anxiety   . congenital nystagmus   . COPD (chronic obstructive pulmonary disease) ([place])   . DJD (degenerative joint disease)   . Fibromyalgia   . Low back pain syndrome   . Memory loss   . Other and unspecified hyperlipidemia   . Severe aortic stenosis   . Venous insufficiency     PSH: Past Surgical History:  Procedure Laterality Date  . ABDOMINAL HYSTERECTOMY    . ANTERIOR CERVICAL DISCECTOMY    . CARPAL TUNNEL RELEASE  12/2011   right arm  . CATARACT EXTRACTION    . LUMBAR LAMINECTOMY    . right knee arthroscopy    . right shoulder replacement  04/2009  . right shoulder surgery  2008   Dr. Noemi Chapel    ALLERGIES: No Known Allergies  MEDICATIONS: Current Outpatient Medications  Medication Sig Dispense Refill  . albuterol (PROVENTIL) (2.5 MG/3ML) 0.083% nebulizer solution Take 3 mLs (2.5 mg total) by nebulization every 4 (four) hours as needed for wheezing or shortness of breath. DX: COPD J44.9 75 mL 12  . albuterol (VENTOLIN HFA) 108 (90 Base) MCG/ACT inhaler Inhale 2 puffs into the lungs every 6 (six) hours as needed for wheezing or shortness  of breath. 18 Inhaler 2  . ALPRAZolam (XANAX) 0.25 MG tablet TAKE 1/2 TO 1 TABLET BY MOUTH TWICE DAILY AS NEEDED. NO MORE THAN 45 TABS PER MONTH (Patient taking differently: Take 0.25 mg by mouth at bedtime. TAKE 1/2 TO 1 TABLET BY MOUTH TWICE DAILY AS NEEDED. NO MORE THAN 45 TABS PER MONTH) 45 tablet 3  . arformoterol (BROVANA) 15 MCG/2ML NEBU Take 2 mLs (15 mcg total) by nebulization 2 (two)  times daily. DX: COPD J44.9 120 mL 12  . aspirin 81 MG tablet Take 81 mg by mouth daily.      . budesonide (PULMICORT) 0.5 MG/2ML nebulizer solution Take 2 mLs (0.5 mg total) by nebulization 2 (two) times daily. DX: COPD J44.9 120 mL 12  . Cholecalciferol (VITAMIN D) 1000 UNITS capsule Take 1,000 Units by mouth daily.      Marland Kitchen escitalopram (LEXAPRO) 20 MG tablet Take 1 tablet (20 mg total) by mouth daily. 90 tablet 1  . multivitamin-lutein (OCUVITE-LUTEIN) CAPS capsule Take 1 capsule by mouth daily.    . simvastatin (ZOCOR) 20 MG tablet Take 1 tablet (20 mg total) by mouth at bedtime. 90 tablet 3  . traZODone (DESYREL) 50 MG tablet Take 0.5-1 tablets (25-50 mg total) by mouth at bedtime as needed for sleep. 90 tablet 0   No current facility-administered medications for this visit.     LABS: Lab Results  Component Value Date   WBC 4.5 06/25/2018   HGB 12.2 06/25/2018   HCT 37.4 06/25/2018   MCV 92 06/25/2018   PLT 228 06/25/2018      Component Value Date/Time   NA 140 06/25/2018 0944   K 4.7 06/25/2018 0944   CL 100 06/25/2018 0944   CO2 27 06/25/2018 0944   GLUCOSE 80 06/25/2018 0944   GLUCOSE 84 05/27/2017 1052   BUN 14 06/25/2018 0944   CREATININE 0.79 06/25/2018 0944   CALCIUM 9.7 06/25/2018 0944   GFRNONAA 66 06/25/2018 0944   GFRAA 76 06/25/2018 0944   No results found for: INR, PROTIME No results found for: PTT  SOCIAL HISTORY: Social History   Socioeconomic History  . Marital status: Widowed    Spouse name: Not on file  . Number of children: 2  . Years of education: Not on file  . Highest education level: Not on file  Occupational History  . Occupation: [employer]  Social Needs  . Financial resource strain: Not on file  . Food insecurity:    Worry: Not on file    Inability: Not on file  . Transportation needs:    Medical: Not on file    Non-medical: Not on file  Tobacco Use  . Smoking status: Former Smoker    Packs/day: 2.00     Years: 30.00    Pack years: 60.00    Types: Cigarettes    Last attempt to quit: 10/28/1991    Years since quitting: 26.7  . Smokeless tobacco: Never Used  Substance and Sexual Activity  . Alcohol use: Yes    Comment: 1 drink per night  . Drug use: No  . Sexual activity: Not on file  Lifestyle  . Physical activity:    Days per week: Not on file    Minutes per session: Not on file  . Stress: Not on file  Relationships  . Social connections:    Talks on phone: Not on file    Gets together: Not on file    Attends religious service: Not on file  Active member of club or organization: Not on file    Attends meetings of clubs or organizations: Not on file    Relationship status: Not on file  . Intimate partner violence:    Fear of current or ex partner: Not on file    Emotionally abused: Not on file    Physically abused: Not on file    Forced sexual activity: Not on file  Other Topics Concern  . Not on file  Social History Narrative   1 sibling alive age 29   1 sibling alive age 22   1 sibling deceased age 29   1 sibling alive age 30    FAMILY HISTORY: Family History  Problem Relation Age of Onset  . Other Mother        broken hip  . Other Father        kidney problems    REVIEW OF SYSTEMS: Reviewed with the patient as per History of present illness. Psych: Patient denies having dental phobia.  DENTAL HISTORY: CHIEF COMPLAINT: Patient referred by Dr. [name] for dental consultation.  HPI: YOLANDER GOODIE Is a 84 year old female referred by Dr. [name] for dental consultation. Patient was recently diagnosed with severe aortic stenosis with anticipated aortic valve replacement. Patient is now seen as part of a pre-heart valve surgery dental protocol to rule out dental infection that may affect the patient's systemic health and anticipated heart valve surgery.  The patient currently denies acute toothaches, swellings, or abscesses. The patient, however, knows that  she has "many bad teeth". Patient was recently seen approximately 3 weeks ago by Dr. Adair Laundry in [place], [place] for an exam and radiographs. The patient was being considered for multiple extractions with alveoloplasty along with crown and bridge therapy from tooth numbers 22 through 28.  Prior to that, the patient had been seen by [facility] for fabrication of an upper complete denture in June of 2019.  Patient indicates that the upper denture "fits good"l. Patient denies having a lower partial denture. Patient denies having dental phobia.  DENTAL EXAMINATION: GENERAL:  The patient is a well developed, slightly built female in no acute distress. HEAD AND NECK:  There is no palpable neck lymphadenopathy. The patient denies acute TMJ symptoms. INTRAORAL EXAM:  Patient has normal saliva. The patient has a small mandibular left lingual torus. There is no evidence of oral abscess formation. DENTITION: The patient is missing tooth numbers 1 through 16, 17-19, and 30-32. Patient has retained root segments in the area tooth numbers 26 and 27. PERIODONTAL:  The patient has chronic periodontitis with plaque and calculus accumulations, gingival recession, and moderate bone loss. There is tooth mobility as per dental charting form. DENTAL CARIES/SUBOPTIMAL RESTORATIONS:  There are multiple dental caries as per dental charting form. Some of the dental caries are impinging on the pulp. ENDODONTIC:  The patient currently denies acute pulpitis symptoms. There is no evidence of periapical pathology or radiolucency. Multiple dental caries are impinging on the pulp. CROWN AND BRIDGE:  Patient has crowns on tooth numbers 20 and 28. He patient has recurrent caries associated with tooth numbers 20 and 28. PROSTHODONTIC: The patient has an upper complete denture that is clinically stable and retentive and is aesthetically acceptable. Patient denies having a lower partial denture. OCCLUSION:  The patient has a poor  occlusal scheme secondary to multiple missing teeth, multiple retained root segments, and lack of replacement of all missing teeth with dental prostheses.  RADIOGRAPHIC INTERPRETATION: Orthopantogram was taken and  supplemented with 5 lower periapical radiographs. There are multiple missing teeth. There are multiple retained root segments. There are rampant dental caries noted. Crowns on tooth numbers 20 and 28 with recurrent caries. There is moderate bone loss noted. There is no evidence of periapical pathology or radiolucency. There is atrophy of the edentulous alveolar ridges.   ASSESSMENTS: 1. Severe aortic stenosis 2. Pre-heart valve surgery dental protocol 3. Multiple retained root segments 4. Rampant dental caries 5. Mandibular left lingual torus 6. Chronic periodontitis with bone loss 7. Gingival recession 8. Accretions 9. Tooth mobility 10. Multiple missing teeth 11. Upper complete denture and no lower partial denture 12. Malocclusion 13. Questionable indeed for antibiotic premedication prior to invasive dental procedures due to previous total knee replacement 14. Risk for complications up to and including death with anticipated invasive dental procedures in the operating room with general anesthesia secondary to cardiovascular and respiratory compromise.  PLAN/RECOMMENDATIONS: 1. I discussed the risks, benefits, and complications of various treatment options with the patient and her daughter in relationship to the patient's medical and dental conditions, anticipated heart valve surgery, and risk for endocarditis. We discussed various treatment options to include no treatment, multiple extractions with alveoloplasty, pre-prosthetic surgery as indicated, periodontal therapy, dental restorations, root canal therapy, crown and bridge therapy, implant therapy, and replacement of missing teeth as indicated. We extensively discussed crown and bridge therapy utilizing tooth numbers 22 and 28  as abutments.  The patient currently wishes to proceed with extraction of remaining teeth with alveoloplasty in the operating room with general anesthesia. After adequate healing once medically stable from the anticipated aortic valve replacement, the patient will then follow-up with the dentist of her choice for fabrication of a new lower complete denture to match her existing upper complete denture.  We will plan on scheduling the dental surgery after the cardiac catheterization which is currently scheduled for 07/07/2018 with Dr. [name]. Hopefully this cardiac catheterization will not require stenting and use of Plavix postoperatively that may prevent dental extraction procedures.  2. Discussion of findings with medical team and coordination of future medical and dental care as needed.  I spent in excess of  120 minutes during the conduct of this consultation and >50% of this time involved direct face-to-face encounter for counseling and/or coordination of the patient's care.    Lenn Cal, DDS

## 2018-07-05 NOTE — Patient Instructions (Signed)
[place]    Department of Dental Medicine     DR. Clairessa Boulet      HEART VALVES AND MOUTH CARE:  FACTS:   If you have any infection in your mouth, it can infect your heart valve.  If you heart valve is infected, you will be seriously ill.  Infections in the mouth can be SILENT and do not always cause pain.  Examples of infections in the mouth are gum disease, dental cavities, and abscesses.  Some possible signs of infection are: Bad breath, bleeding gums, or teeth that are sensitive to sweets, hot, and/or cold. There are many other signs as well.  WHAT YOU HAVE TO DO:   Brush your teeth after meals and at bedtime. Spend at least 2 minutes brushing well, especially behind your back teeth and all around your teeth that stand alone. Brush at the gumline also.  Do not go to bed without brushing your teeth and flossing.  If you gums bleed when you brush or floss, do NOT stop brushing or flossing. It usually means that your gums need more attention and better cleaning.   If your Dentist or Dr. Marget Outten gave you a prescription mouthwash to use, make sure to use it as directed. If you run out of the medication, get a refill at the pharmacy.   If you were given any other medications or directions by your Dentist, please follow them. If you did not understand the directions or forget what you were told, please call. We will be happy to refresh her memory.  If you need antibiotics before dental procedures, make sure you take them one hour prior to every dental visit as directed.   Get a dental checkup every 4-6 months in order to keep your mouth healthy, or to find and treat any new infection. You will most likely need your teeth cleaned or gums treated at the same time.  If you are not able to come in for your scheduled appointment, call your Dentist as soon as possible to reschedule.  If you have a problem in between dental visits, call your Dentist.  

## 2018-07-06 ENCOUNTER — Telehealth: Payer: Self-pay | Admitting: *Deleted

## 2018-07-06 NOTE — Telephone Encounter (Addendum)
Pt contacted pre-catheterization scheduled at [facility] for: Wednesday September 11, [address] 11:30 AM Verified arrival time and place: [place] Entrance A at: 9 AM  No solid food after midnight prior to cath, clear liquids until 5 AM day of procedure. Verified no known allergies. Verified no diabetes medications.  AM meds can be  taken pre-cath with sip of water including: ASA 81 mg  Confirmed patient has responsible person to drive home post procedure and for 24 hours after you arrive home: yes  I discussed instructions with patient's daughter, [name] (DPR).

## 2018-07-07 ENCOUNTER — Encounter (HOSPITAL_COMMUNITY)
Admission: RE | Disposition: A | Discharge status: Home / Self Care | Payer: Self-pay | Source: Ambulatory Visit | Attending: Cardiovascular Disease

## 2018-07-07 ENCOUNTER — Ambulatory Visit (HOSPITAL_COMMUNITY)
Admission: RE | Admit: 2018-07-07 | Discharge: 2018-07-07 | Disposition: A | Discharge status: Home / Self Care | Payer: Medicare Other | Source: Ambulatory Visit | Attending: Cardiovascular Disease | Admitting: Cardiovascular Disease

## 2018-07-07 DIAGNOSIS — F419 Anxiety disorder, unspecified: Secondary | ICD-10-CM | POA: Insufficient documentation

## 2018-07-07 DIAGNOSIS — I350 Nonrheumatic aortic (valve) stenosis: Secondary | ICD-10-CM | POA: Insufficient documentation

## 2018-07-07 DIAGNOSIS — M545 Low back pain: Secondary | ICD-10-CM | POA: Insufficient documentation

## 2018-07-07 DIAGNOSIS — Z7982 Long term (current) use of aspirin: Secondary | ICD-10-CM | POA: Insufficient documentation

## 2018-07-07 DIAGNOSIS — I2584 Coronary atherosclerosis due to calcified coronary lesion: Secondary | ICD-10-CM | POA: Insufficient documentation

## 2018-07-07 DIAGNOSIS — I872 Venous insufficiency (chronic) (peripheral): Secondary | ICD-10-CM | POA: Diagnosis not present

## 2018-07-07 DIAGNOSIS — M1990 Unspecified osteoarthritis, unspecified site: Secondary | ICD-10-CM | POA: Insufficient documentation

## 2018-07-07 DIAGNOSIS — E785 Hyperlipidemia, unspecified: Secondary | ICD-10-CM | POA: Diagnosis not present

## 2018-07-07 DIAGNOSIS — J449 Chronic obstructive pulmonary disease, unspecified: Secondary | ICD-10-CM | POA: Diagnosis not present

## 2018-07-07 DIAGNOSIS — M797 Fibromyalgia: Secondary | ICD-10-CM | POA: Diagnosis not present

## 2018-07-07 DIAGNOSIS — I2510 Atherosclerotic heart disease of native coronary artery without angina pectoris: Secondary | ICD-10-CM | POA: Diagnosis not present

## 2018-07-07 DIAGNOSIS — Z87891 Personal history of nicotine dependence: Secondary | ICD-10-CM | POA: Diagnosis not present

## 2018-07-07 DIAGNOSIS — Z7951 Long term (current) use of inhaled steroids: Secondary | ICD-10-CM | POA: Insufficient documentation

## 2018-07-07 HISTORY — PX: ULTRASOUND GUIDANCE FOR VASCULAR ACCESS: SHX6516

## 2018-07-07 HISTORY — PX: RIGHT/LEFT HEART CATH AND CORONARY ANGIOGRAPHY: CATH118266

## 2018-07-07 LAB — POCT I-STAT 3, ART BLOOD GAS (G3+)
Acid-Base Excess: 1 mmol/L (ref 0.0–2.0)
Bicarbonate: 26.2 mmol/L (ref 20.0–28.0)
O2 Saturation: 95 %
PH ART: 7.378 (ref 7.350–7.450)
TCO2: 28 mmol/L (ref 22–32)
pCO2 arterial: 44.5 mmHg (ref 32.0–48.0)
pO2, Arterial: 76 mmHg — ABNORMAL LOW (ref 83.0–108.0)

## 2018-07-07 LAB — POCT I-STAT 3, VENOUS BLOOD GAS (G3P V)
ACID-BASE EXCESS: 2 mmol/L (ref 0.0–2.0)
Bicarbonate: 27.6 mmol/L (ref 20.0–28.0)
O2 Saturation: 82 %
PH VEN: 7.373 (ref 7.250–7.430)
PO2 VEN: 48 mmHg — AB (ref 32.0–45.0)
TCO2: 29 mmol/L (ref 22–32)
pCO2, Ven: 47.5 mmHg (ref 44.0–60.0)

## 2018-07-07 LAB — POCT ACTIVATED CLOTTING TIME: Activated Clotting Time: 158 seconds

## 2018-07-07 SURGERY — RIGHT/LEFT HEART CATH AND CORONARY ANGIOGRAPHY
Anesthesia: LOCAL

## 2018-07-07 MED ORDER — HYDRALAZINE HCL 20 MG/ML IJ SOLN
10.0000 mg | Freq: Once | INTRAMUSCULAR | Status: AC
  Administered 2018-07-07: 16:00:00 via INTRAVENOUS

## 2018-07-07 MED ORDER — SODIUM CHLORIDE 0.9% FLUSH: 3.0000 mL | Freq: Two times a day (BID) | INTRAVENOUS | Status: DC

## 2018-07-07 MED ORDER — IOHEXOL 350 MG/ML SOLN
INTRAVENOUS | Status: DC | PRN
  Administered 2018-07-07: 50 mL via INTRAVENOUS

## 2018-07-07 MED ORDER — SODIUM CHLORIDE 0.9 % IV SOLN: INTRAVENOUS | Status: AC

## 2018-07-07 MED ORDER — LIDOCAINE HCL (PF) 1 % IJ SOLN
INTRAMUSCULAR | Status: DC | PRN
  Administered 2018-07-07: 2 mL
  Administered 2018-07-07: 12 mL

## 2018-07-07 MED ORDER — SODIUM CHLORIDE 0.9% FLUSH: 3.0000 mL | INTRAVENOUS | Status: DC | PRN

## 2018-07-07 MED ORDER — ASPIRIN 81 MG PO CHEW: 81.0000 mg | CHEWABLE_TABLET | ORAL | Status: DC

## 2018-07-07 MED ORDER — HEPARIN (PORCINE) IN NACL 1000-0.9 UT/500ML-% IV SOLN
INTRAVENOUS | Status: DC | PRN
  Administered 2018-07-07 (×2): 500 mL

## 2018-07-07 MED ORDER — LIDOCAINE HCL (PF) 1 % IJ SOLN
INTRAMUSCULAR | Status: AC
  Filled 2018-07-07: qty 30

## 2018-07-07 MED ORDER — HEPARIN SODIUM (PORCINE) 1000 UNIT/ML IJ SOLN
INTRAMUSCULAR | Status: AC
  Filled 2018-07-07: qty 1

## 2018-07-07 MED ORDER — SODIUM CHLORIDE 0.9 % IV SOLN: 250.0000 mL | INTRAVENOUS | Status: DC | PRN

## 2018-07-07 MED ORDER — MIDAZOLAM HCL 2 MG/2ML IJ SOLN
INTRAMUSCULAR | Status: AC
  Filled 2018-07-07: qty 2

## 2018-07-07 MED ORDER — HEPARIN (PORCINE) IN NACL 1000-0.9 UT/500ML-% IV SOLN
INTRAVENOUS | Status: AC
  Filled 2018-07-07: qty 1000

## 2018-07-07 MED ORDER — ACETAMINOPHEN 325 MG PO TABS: 650.0000 mg | ORAL_TABLET | ORAL | Status: DC | PRN

## 2018-07-07 MED ORDER — VERAPAMIL HCL 2.5 MG/ML IV SOLN
INTRAVENOUS | Status: DC | PRN
  Administered 2018-07-07: 10 mL via INTRA_ARTERIAL

## 2018-07-07 MED ORDER — HEPARIN SODIUM (PORCINE) 1000 UNIT/ML IJ SOLN
INTRAMUSCULAR | Status: DC | PRN
  Administered 2018-07-07: 3000 [IU] via INTRAVENOUS

## 2018-07-07 MED ORDER — SODIUM CHLORIDE 0.9 % IV SOLN
INTRAVENOUS | Status: AC
  Administered 2018-07-07: 10:00:00 via INTRAVENOUS

## 2018-07-07 MED ORDER — HYDRALAZINE HCL 20 MG/ML IJ SOLN
INTRAMUSCULAR | Status: AC
  Filled 2018-07-07: qty 1

## 2018-07-07 MED ORDER — ONDANSETRON HCL 4 MG/2ML IJ SOLN: 4.0000 mg | Freq: Four times a day (QID) | INTRAMUSCULAR | Status: DC | PRN

## 2018-07-07 MED ORDER — VERAPAMIL HCL 2.5 MG/ML IV SOLN
INTRAVENOUS | Status: AC
  Filled 2018-07-07: qty 2

## 2018-07-07 MED ORDER — MIDAZOLAM HCL 2 MG/2ML IJ SOLN
INTRAMUSCULAR | Status: DC | PRN
  Administered 2018-07-07: 0.5 mg via INTRAVENOUS

## 2018-07-07 SURGICAL SUPPLY — 17 items
CATH EXPO 5FR AL3 (CATHETERS) ×3 IMPLANT
CATH INFINITI 5 FR AL2 (CATHETERS) ×3 IMPLANT
CATH INFINITI 5FR AL1 (CATHETERS) ×3 IMPLANT
CATH INFINITI MULTIPACK ST 5F (CATHETERS) ×3 IMPLANT
CATH SWAN GANZ 7F STRAIGHT (CATHETERS) ×3 IMPLANT
DEVICE RAD COMP TR BAND LRG (VASCULAR PRODUCTS) ×3 IMPLANT
GLIDESHEATH SLEND SS 6F .021 (SHEATH) ×3 IMPLANT
GUIDEWIRE INQWIRE 1.5J.035X260 (WIRE) ×2 IMPLANT
INQWIRE 1.5J .035X260CM (WIRE) ×3
KIT HEART LEFT (KITS) ×3 IMPLANT
PACK CARDIAC CATHETERIZATION (CUSTOM PROCEDURE TRAY) ×3 IMPLANT
SHEATH GLIDE SLENDER 4/5FR (SHEATH) ×3 IMPLANT
SHEATH PINNACLE 7F 10CM (SHEATH) ×3 IMPLANT
SHEATH PROBE COVER 6X72 (BAG) ×3 IMPLANT
TRANSDUCER W/STOPCOCK (MISCELLANEOUS) ×3 IMPLANT
TUBING CIL FLEX 10 FLL-RA (TUBING) ×3 IMPLANT
WIRE EMERALD ST .035X150CM (WIRE) ×3 IMPLANT

## 2018-07-07 NOTE — Research (Signed)
CADFEM Informed Consent   Subject Name: Jessica [name] [facility]  Subject met inclusion and exclusion criteria.  The informed consent form, study requirements and expectations were reviewed with the subject and questions and concerns were addressed prior to the signing of the consent form.  The subject verbalized understanding of the trail requirements.  The subject agreed to participate in the CADFEM trial and signed the informed consent.  The informed consent was obtained prior to performance of any protocol-specific procedures for the subject.  A copy of the signed informed consent was given to the subject and a copy was placed in the subject's medical record.  Neva Seat 07/07/2018, 10:30 AM

## 2018-07-07 NOTE — Interval H&P Note (Signed)
History and Physical Interval Note:  07/07/2018 11:39 AM  [name]  has presented today for cardiac cath with the diagnosis of severe AS.  The various methods of treatment have been discussed with the patient and family. After consideration of risks, benefits and other options for treatment, the patient has consented to  Procedure(s): RIGHT/LEFT HEART CATH AND CORONARY ANGIOGRAPHY (N/A) as a surgical intervention .  The patient's history has been reviewed, patient examined, no change in status, stable for surgery.  I have reviewed the patient's chart and labs.  Questions were answered to the patient's satisfaction.    Cath Lab Visit (complete for each Cath Lab visit)  Clinical Evaluation Leading to the Procedure:   ACS: No.  Non-ACS:    Anginal Classification: CCS II  Anti-ischemic medical therapy: No Therapy  Non-Invasive Test Results: No non-invasive testing performed  Prior CABG: No previous CABG         Jessica [name]

## 2018-07-07 NOTE — Progress Notes (Signed)
Site area: Right groin a 7 french venous sheath was removed  Site Prior to Removal:  Level 0  Pressure Applied For 20 MINUTES    Bedrest Beginning at 1520p  Manual:   Yes.    Patient Status During Pull:  stable  Post Pull Groin Site:  Level 0  Post Pull Instructions Given:  Yes.    Post Pull Pulses Present:  Yes.    Dressing Applied:  Yes.    Comments:  VS remain stable  Hydralazine given for BP

## 2018-07-07 NOTE — Discharge Instructions (Signed)

## 2018-07-08 ENCOUNTER — Telehealth: Payer: Self-pay

## 2018-07-08 ENCOUNTER — Encounter (HOSPITAL_COMMUNITY): Payer: Self-pay | Admitting: Cardiovascular Disease

## 2018-07-08 NOTE — Telephone Encounter (Signed)
The pt's daughter contacted me in regards to the pt complaining of itching and having a red rash on her back and lower body as if she was "burned".  [name] just gave the pt Benadryl to help with symptoms.  I advised [name] that the pt can take Benadryl per instructions on OTC medication box and increase her fluid intake. I asked [name] if the pt has ever been given IV contrast before and she thinks yesterday was the first time.  I will list IV contrast as allergy in the pt's chart. The pt is not having difficulty breathing at this time or CP.  [name] will continue to monitor the pt.

## 2018-07-08 NOTE — Telephone Encounter (Signed)
Agree. Thanks. See my staff message to you.

## 2018-07-12 ENCOUNTER — Telehealth: Payer: Self-pay

## 2018-07-12 ENCOUNTER — Other Ambulatory Visit: Payer: Self-pay | Admitting: Physician Assistant

## 2018-07-12 MED ORDER — PREDNISONE 20 MG PO TABS: 20.0000 mg | ORAL_TABLET | ORAL | 0 refills | Status: DC

## 2018-07-12 NOTE — Telephone Encounter (Signed)
Maybe we should do a Medrol dose pack. Do you mind calling this in for her? Gerald Stabs

## 2018-07-12 NOTE — Telephone Encounter (Signed)
Steroid taper called into her pharmacy

## 2018-07-12 NOTE — Telephone Encounter (Signed)
I spoke with the pt's daughter and the pt continues to complain of itching and rash on her body (ams, legs and torso).  The redness has improved slightly but the pt has itched so much that she has started to bleed.  The pt has not started any new medications and has continued taking Benadryl over the weekend.  The pt's daughter would like to know if something can be prescribed to help with symptoms. I asked if the pt has taken steroids in the past and the pt has without issues.  I will forward this message to Dr [name] to review and give further recommendations.

## 2018-07-13 ENCOUNTER — Other Ambulatory Visit: Payer: Self-pay

## 2018-07-13 DIAGNOSIS — I2510 Atherosclerotic heart disease of native coronary artery without angina pectoris: Secondary | ICD-10-CM

## 2018-07-13 DIAGNOSIS — R0609 Other forms of dyspnea: Secondary | ICD-10-CM

## 2018-07-13 DIAGNOSIS — I350 Nonrheumatic aortic (valve) stenosis: Secondary | ICD-10-CM

## 2018-07-13 MED ORDER — PREDNISONE 50 MG PO TABS: ORAL_TABLET | ORAL | 0 refills | Status: DC

## 2018-07-13 MED ORDER — CLOPIDOGREL BISULFATE 75 MG PO TABS: 75.0000 mg | ORAL_TABLET | Freq: Every day | ORAL | 11 refills | Status: DC

## 2018-07-14 NOTE — Addendum Note (Signed)
Addended by: Lauree Chandler D on: 07/14/2018 02:41 PM   Modules accepted: Orders, SmartSet

## 2018-07-15 ENCOUNTER — Other Ambulatory Visit: Payer: Medicare Other | Admitting: *Deleted

## 2018-07-15 DIAGNOSIS — R0609 Other forms of dyspnea: Secondary | ICD-10-CM

## 2018-07-15 DIAGNOSIS — I2510 Atherosclerotic heart disease of native coronary artery without angina pectoris: Secondary | ICD-10-CM | POA: Diagnosis not present

## 2018-07-15 DIAGNOSIS — I350 Nonrheumatic aortic (valve) stenosis: Secondary | ICD-10-CM

## 2018-07-16 LAB — CBC
HEMATOCRIT: 36.5 % (ref 34.0–46.6)
HEMOGLOBIN: 11.9 g/dL (ref 11.1–15.9)
MCH: 30.7 pg (ref 26.6–33.0)
MCHC: 32.6 g/dL (ref 31.5–35.7)
MCV: 94 fL (ref 79–97)
Platelets: 278 10*3/uL (ref 150–450)
RBC: 3.87 x10E6/uL (ref 3.77–5.28)
RDW: 16.2 % — ABNORMAL HIGH (ref 12.3–15.4)
WBC: 7.1 10*3/uL (ref 3.4–10.8)

## 2018-07-16 LAB — BASIC METABOLIC PANEL
BUN/Creatinine Ratio: 13 (ref 12–28)
BUN: 10 mg/dL (ref 10–36)
CALCIUM: 9.8 mg/dL (ref 8.7–10.3)
CO2: 28 mmol/L (ref 20–29)
CREATININE: 0.75 mg/dL (ref 0.57–1.00)
Chloride: 103 mmol/L (ref 96–106)
GFR calc Af Amer: 81 mL/min/{1.73_m2} (ref >59)
GFR, EST NON AFRICAN AMERICAN: 70 mL/min/{1.73_m2} (ref >59)
Glucose: 128 mg/dL — ABNORMAL HIGH (ref 65–99)
Potassium: 4.1 mmol/L (ref 3.5–5.2)
SODIUM: 145 mmol/L — AB (ref 134–144)

## 2018-07-22 ENCOUNTER — Telehealth: Payer: Self-pay | Admitting: *Deleted

## 2018-07-22 NOTE — Telephone Encounter (Signed)
Pt contacted pre-PCI scheduled at [facility] for: Friday July 23, 2018 7:30 AM Verified arrival time and place: [place] Entrance A at: 5:30 AM  No solid food after midnight prior to cath, clear liquids until 5 AM day of procedure.  AM meds can be  taken pre-cath with sip of water including: Clopidogrel 75 mg ASA 81 mg Prednisone 50 mg Benadryl 50 mg  Confirmed patient has responsible person to drive home post procedure and for 24 hours after you arrive home: yes

## 2018-07-22 NOTE — Telephone Encounter (Signed)
Encounter error

## 2018-07-23 ENCOUNTER — Observation Stay (HOSPITAL_COMMUNITY)
Admission: RE | Admit: 2018-07-23 | Discharge: 2018-07-25 | Disposition: A | Discharge status: Home / Self Care | Payer: Medicare Other | Source: Ambulatory Visit | Attending: Interventional Cardiology | Admitting: Interventional Cardiology

## 2018-07-23 ENCOUNTER — Other Ambulatory Visit: Payer: Self-pay

## 2018-07-23 ENCOUNTER — Encounter (HOSPITAL_COMMUNITY): Payer: Self-pay | Admitting: General Practice

## 2018-07-23 ENCOUNTER — Encounter (HOSPITAL_COMMUNITY)
Admission: RE | Disposition: A | Discharge status: Home / Self Care | Payer: Self-pay | Source: Ambulatory Visit | Attending: Cardiovascular Disease

## 2018-07-23 DIAGNOSIS — Z96611 Presence of right artificial shoulder joint: Secondary | ICD-10-CM | POA: Insufficient documentation

## 2018-07-23 DIAGNOSIS — F419 Anxiety disorder, unspecified: Secondary | ICD-10-CM | POA: Diagnosis not present

## 2018-07-23 DIAGNOSIS — Z9849 Cataract extraction status, unspecified eye: Secondary | ICD-10-CM | POA: Insufficient documentation

## 2018-07-23 DIAGNOSIS — Z79899 Other long term (current) drug therapy: Secondary | ICD-10-CM | POA: Insufficient documentation

## 2018-07-23 DIAGNOSIS — I350 Nonrheumatic aortic (valve) stenosis: Secondary | ICD-10-CM

## 2018-07-23 DIAGNOSIS — Z9889 Other specified postprocedural states: Secondary | ICD-10-CM

## 2018-07-23 DIAGNOSIS — Z7982 Long term (current) use of aspirin: Secondary | ICD-10-CM | POA: Insufficient documentation

## 2018-07-23 DIAGNOSIS — J441 Chronic obstructive pulmonary disease with (acute) exacerbation: Secondary | ICD-10-CM | POA: Diagnosis present

## 2018-07-23 DIAGNOSIS — Z90710 Acquired absence of both cervix and uterus: Secondary | ICD-10-CM | POA: Diagnosis not present

## 2018-07-23 DIAGNOSIS — I2584 Coronary atherosclerosis due to calcified coronary lesion: Secondary | ICD-10-CM | POA: Insufficient documentation

## 2018-07-23 DIAGNOSIS — M1990 Unspecified osteoarthritis, unspecified site: Secondary | ICD-10-CM | POA: Diagnosis not present

## 2018-07-23 DIAGNOSIS — Z7951 Long term (current) use of inhaled steroids: Secondary | ICD-10-CM | POA: Diagnosis not present

## 2018-07-23 DIAGNOSIS — I25110 Atherosclerotic heart disease of native coronary artery with unstable angina pectoris: Principal | ICD-10-CM

## 2018-07-23 DIAGNOSIS — I872 Venous insufficiency (chronic) (peripheral): Secondary | ICD-10-CM | POA: Insufficient documentation

## 2018-07-23 DIAGNOSIS — R413 Other amnesia: Secondary | ICD-10-CM | POA: Diagnosis not present

## 2018-07-23 DIAGNOSIS — E785 Hyperlipidemia, unspecified: Secondary | ICD-10-CM | POA: Diagnosis not present

## 2018-07-23 DIAGNOSIS — Z955 Presence of coronary angioplasty implant and graft: Secondary | ICD-10-CM

## 2018-07-23 DIAGNOSIS — Z87891 Personal history of nicotine dependence: Secondary | ICD-10-CM | POA: Diagnosis not present

## 2018-07-23 DIAGNOSIS — I2510 Atherosclerotic heart disease of native coronary artery without angina pectoris: Secondary | ICD-10-CM | POA: Diagnosis present

## 2018-07-23 DIAGNOSIS — M797 Fibromyalgia: Secondary | ICD-10-CM | POA: Diagnosis not present

## 2018-07-23 DIAGNOSIS — I25118 Atherosclerotic heart disease of native coronary artery with other forms of angina pectoris: Secondary | ICD-10-CM | POA: Diagnosis present

## 2018-07-23 DIAGNOSIS — E782 Mixed hyperlipidemia: Secondary | ICD-10-CM | POA: Diagnosis present

## 2018-07-23 HISTORY — DX: Atherosclerotic heart disease of native coronary artery without angina pectoris: I25.10

## 2018-07-23 HISTORY — PX: CORONARY STENT INTERVENTION: CATH118234

## 2018-07-23 HISTORY — PX: CORONARY ATHERECTOMY: CATH118238

## 2018-07-23 LAB — POCT ACTIVATED CLOTTING TIME
ACTIVATED CLOTTING TIME: 301 s
Activated Clotting Time: 945 seconds

## 2018-07-23 SURGERY — CORONARY ATHERECTOMY
Anesthesia: LOCAL

## 2018-07-23 MED ORDER — VERAPAMIL HCL 2.5 MG/ML IV SOLN
INTRAVENOUS | Status: DC | PRN
  Administered 2018-07-23: 10 mL via INTRA_ARTERIAL

## 2018-07-23 MED ORDER — IOHEXOL 350 MG/ML SOLN
INTRAVENOUS | Status: DC | PRN
  Administered 2018-07-23: 135 mL via INTRA_ARTERIAL

## 2018-07-23 MED ORDER — ACETAMINOPHEN 325 MG PO TABS
650.0000 mg | ORAL_TABLET | ORAL | Status: DC | PRN
  Administered 2018-07-23: 650 mg via ORAL
  Filled 2018-07-23: qty 2

## 2018-07-23 MED ORDER — SODIUM CHLORIDE 0.9 % IV SOLN
INTRAVENOUS | Status: DC
  Administered 2018-07-23: 07:00:00 via INTRAVENOUS

## 2018-07-23 MED ORDER — LIDOCAINE HCL (PF) 1 % IJ SOLN
INTRAMUSCULAR | Status: DC | PRN
  Administered 2018-07-23: 2 mL via INTRADERMAL

## 2018-07-23 MED ORDER — HYDRALAZINE HCL 20 MG/ML IJ SOLN: 5.0000 mg | INTRAMUSCULAR | Status: AC | PRN

## 2018-07-23 MED ORDER — LABETALOL HCL 5 MG/ML IV SOLN: 10.0000 mg | INTRAVENOUS | Status: AC | PRN

## 2018-07-23 MED ORDER — SIMVASTATIN 20 MG PO TABS
20.0000 mg | ORAL_TABLET | Freq: Every day | ORAL | Status: DC
  Administered 2018-07-23 – 2018-07-24 (×2): 20 mg via ORAL
  Filled 2018-07-23 (×2): qty 1

## 2018-07-23 MED ORDER — NITROGLYCERIN 1 MG/10 ML FOR IR/CATH LAB
INTRA_ARTERIAL | Status: AC
  Filled 2018-07-23: qty 10

## 2018-07-23 MED ORDER — SODIUM CHLORIDE 0.9 % IV SOLN
INTRAVENOUS | Status: DC | PRN
  Administered 2018-07-23: 10 mL/h via INTRAVENOUS

## 2018-07-23 MED ORDER — DIPHENHYDRAMINE HCL 25 MG PO CAPS
25.0000 mg | ORAL_CAPSULE | Freq: Once | ORAL | Status: AC
  Administered 2018-07-23: 15:00:00 25 mg via ORAL
  Filled 2018-07-23: qty 1

## 2018-07-23 MED ORDER — HEPARIN (PORCINE) IN NACL 1000-0.9 UT/500ML-% IV SOLN
INTRAVENOUS | Status: AC
  Filled 2018-07-23: qty 500

## 2018-07-23 MED ORDER — SODIUM CHLORIDE 0.9% FLUSH: 3.0000 mL | INTRAVENOUS | Status: DC | PRN

## 2018-07-23 MED ORDER — NITROGLYCERIN 1 MG/10 ML FOR IR/CATH LAB
INTRA_ARTERIAL | Status: DC | PRN
  Administered 2018-07-23: 200 ug via INTRACORONARY

## 2018-07-23 MED ORDER — HEPARIN SODIUM (PORCINE) 1000 UNIT/ML IJ SOLN
INTRAMUSCULAR | Status: AC
  Filled 2018-07-23: qty 1

## 2018-07-23 MED ORDER — ASPIRIN 81 MG PO CHEW: 81.0000 mg | CHEWABLE_TABLET | ORAL | Status: DC

## 2018-07-23 MED ORDER — CLOPIDOGREL BISULFATE 75 MG PO TABS
75.0000 mg | ORAL_TABLET | Freq: Every day | ORAL | Status: DC
  Administered 2018-07-24 – 2018-07-25 (×2): 75 mg via ORAL
  Filled 2018-07-23 (×2): qty 1

## 2018-07-23 MED ORDER — SODIUM CHLORIDE 0.9 % IV SOLN: 250.0000 mL | INTRAVENOUS | Status: DC | PRN

## 2018-07-23 MED ORDER — VERAPAMIL HCL 2.5 MG/ML IV SOLN
INTRAVENOUS | Status: AC
  Filled 2018-07-23: qty 2

## 2018-07-23 MED ORDER — BUDESONIDE 0.5 MG/2ML IN SUSP
0.5000 mg | Freq: Two times a day (BID) | RESPIRATORY_TRACT | Status: DC
  Administered 2018-07-23 – 2018-07-25 (×5): 0.5 mg via RESPIRATORY_TRACT
  Filled 2018-07-23 (×5): qty 2

## 2018-07-23 MED ORDER — DIPHENHYDRAMINE HCL 50 MG/ML IJ SOLN: 25.0000 mg | Freq: Once | INTRAMUSCULAR | Status: DC

## 2018-07-23 MED ORDER — FAMOTIDINE IN NACL 20-0.9 MG/50ML-% IV SOLN
20.0000 mg | Freq: Once | INTRAVENOUS | Status: AC
  Administered 2018-07-23: 20 mg via INTRAVENOUS
  Filled 2018-07-23: qty 50

## 2018-07-23 MED ORDER — ALBUTEROL SULFATE HFA 108 (90 BASE) MCG/ACT IN AERS: 2.0000 | INHALATION_SPRAY | Freq: Four times a day (QID) | RESPIRATORY_TRACT | Status: DC | PRN

## 2018-07-23 MED ORDER — HEPARIN (PORCINE) IN NACL 1000-0.9 UT/500ML-% IV SOLN
INTRAVENOUS | Status: DC | PRN
  Administered 2018-07-23 (×2): 500 mL

## 2018-07-23 MED ORDER — ONDANSETRON HCL 4 MG/2ML IJ SOLN
4.0000 mg | Freq: Four times a day (QID) | INTRAMUSCULAR | Status: DC | PRN
  Administered 2018-07-24: 4 mg via INTRAVENOUS
  Filled 2018-07-23: qty 2

## 2018-07-23 MED ORDER — HEPARIN SODIUM (PORCINE) 1000 UNIT/ML IJ SOLN
INTRAMUSCULAR | Status: DC | PRN
  Administered 2018-07-23: 8000 [IU] via INTRAVENOUS

## 2018-07-23 MED ORDER — SODIUM CHLORIDE 0.9% FLUSH: 3.0000 mL | Freq: Two times a day (BID) | INTRAVENOUS | Status: DC

## 2018-07-23 MED ORDER — ALBUTEROL SULFATE (2.5 MG/3ML) 0.083% IN NEBU: 2.5000 mg | INHALATION_SOLUTION | RESPIRATORY_TRACT | Status: DC | PRN

## 2018-07-23 MED ORDER — SODIUM CHLORIDE 0.9% FLUSH
3.0000 mL | Freq: Two times a day (BID) | INTRAVENOUS | Status: DC
  Administered 2018-07-24: 3 mL via INTRAVENOUS

## 2018-07-23 MED ORDER — ESCITALOPRAM OXALATE 10 MG PO TABS
20.0000 mg | ORAL_TABLET | Freq: Every day | ORAL | Status: DC
  Administered 2018-07-23 – 2018-07-25 (×3): 20 mg via ORAL
  Filled 2018-07-23: qty 2
  Filled 2018-07-23 (×2): qty 1
  Filled 2018-07-23: qty 2

## 2018-07-23 MED ORDER — VIPERSLIDE LUBRICANT OPTIME
TOPICAL | Status: DC | PRN
  Administered 2018-07-23: 08:00:00 via SURGICAL_CAVITY

## 2018-07-23 MED ORDER — SODIUM CHLORIDE 0.9 % IV SOLN
INTRAVENOUS | Status: AC
  Administered 2018-07-23: 10:00:00 via INTRAVENOUS

## 2018-07-23 MED ORDER — LIDOCAINE HCL (PF) 1 % IJ SOLN
INTRAMUSCULAR | Status: AC
  Filled 2018-07-23: qty 30

## 2018-07-23 MED ORDER — ARFORMOTEROL TARTRATE 15 MCG/2ML IN NEBU
15.0000 ug | INHALATION_SOLUTION | Freq: Two times a day (BID) | RESPIRATORY_TRACT | Status: DC
  Administered 2018-07-23 – 2018-07-25 (×5): 15 ug via RESPIRATORY_TRACT
  Filled 2018-07-23 (×7): qty 2

## 2018-07-23 MED ORDER — DIPHENHYDRAMINE HCL 50 MG/ML IJ SOLN
25.0000 mg | Freq: Once | INTRAMUSCULAR | Status: AC
  Administered 2018-07-23: 20:00:00 25 mg via INTRAVENOUS
  Filled 2018-07-23: qty 1

## 2018-07-23 MED ORDER — ASPIRIN EC 81 MG PO TBEC
81.0000 mg | DELAYED_RELEASE_TABLET | Freq: Every morning | ORAL | Status: DC
  Administered 2018-07-24 – 2018-07-25 (×2): 81 mg via ORAL
  Filled 2018-07-23 (×2): qty 1

## 2018-07-23 SURGICAL SUPPLY — 21 items
BALLN SAPPHIRE 2.5X12 (BALLOONS) ×2
BALLN SAPPHIRE ~~LOC~~ 3.25X12 (BALLOONS) ×2 IMPLANT
BALLOON SAPPHIRE 2.5X12 (BALLOONS) ×1 IMPLANT
CATH TELEPORT (CATHETERS) ×2 IMPLANT
CATH VISTA GUIDE 6FR XBLAD3.5 (CATHETERS) ×2 IMPLANT
CROWN DIAMONDBACK CLASSIC 1.25 (BURR) ×2 IMPLANT
DEVICE RAD COMP TR BAND LRG (VASCULAR PRODUCTS) ×2 IMPLANT
ELECT DEFIB PAD ADLT CADENCE (PAD) ×2 IMPLANT
GLIDESHEATH SLEND SS 6F .021 (SHEATH) ×2 IMPLANT
GUIDEWIRE INQWIRE 1.5J.035X260 (WIRE) ×1 IMPLANT
INQWIRE 1.5J .035X260CM (WIRE) ×2
KIT ENCORE 26 ADVANTAGE (KITS) ×2 IMPLANT
KIT HEART LEFT (KITS) ×2 IMPLANT
LUBRICANT VIPERSLIDE CORONARY (MISCELLANEOUS) ×2 IMPLANT
PACK CARDIAC CATHETERIZATION (CUSTOM PROCEDURE TRAY) ×2 IMPLANT
STENT SYNERGY DES 3X16 (Permanent Stent) ×2 IMPLANT
TRANSDUCER W/STOPCOCK (MISCELLANEOUS) ×2 IMPLANT
TUBING CIL FLEX 10 FLL-RA (TUBING) ×2 IMPLANT
WIRE COUGAR XT STRL 190CM (WIRE) ×2 IMPLANT
WIRE COUGAR XT STRL 300CM (WIRE) ×2 IMPLANT
WIRE VIPER ADVANCE COR .012TIP (WIRE) ×2 IMPLANT

## 2018-07-23 NOTE — Interval H&P Note (Signed)
History and Physical Interval Note:  07/23/2018 6:53 AM  Jessica [name]  has presented today for cardiac cath /PCI of the LAD with the diagnosis of cad/unstable angina.  The various methods of treatment have been discussed with the patient and family. After consideration of risks, benefits and other options for treatment, the patient has consented to  Procedure(s): CORONARY ATHERECTOMY (N/A) as a surgical intervention .  The patient's history has been reviewed, patient examined, no change in status, stable for surgery.  I have reviewed the patient's chart and labs.  Questions were answered to the patient's satisfaction.    Cath Lab Visit (complete for each Cath Lab visit)  Clinical Evaluation Leading to the Procedure:   ACS: No.  Non-ACS:    Anginal Classification: CCS II  Anti-ischemic medical therapy: No Therapy  Non-Invasive Test Results: No non-invasive testing performed  Prior CABG: No previous CABG         Lauree Chandler

## 2018-07-23 NOTE — Progress Notes (Addendum)
I txt paged cardiology and they are on floor. Pt is red in color. Pt is flushed and itching all over.

## 2018-07-23 NOTE — Progress Notes (Signed)
  Was called to pt beside due to suspected contrast allergy. Pt s/p cardiac cath today and was premedicated, but now with diffuse pleuritic rash and flushing. Pt denies dyspnea. No throat tightness. Respiratory status ok. I contacted on call pharmacist to discuss case. Pharmacist recommended another round of IV benadryl 25 mg x 1 and 20 mg of IV Pepcid. Will also notify Dr. Domenic Polite and overnight fellow.  Lyda Jester, PA-C

## 2018-07-24 DIAGNOSIS — I872 Venous insufficiency (chronic) (peripheral): Secondary | ICD-10-CM | POA: Diagnosis not present

## 2018-07-24 DIAGNOSIS — F419 Anxiety disorder, unspecified: Secondary | ICD-10-CM | POA: Diagnosis not present

## 2018-07-24 DIAGNOSIS — M797 Fibromyalgia: Secondary | ICD-10-CM | POA: Diagnosis not present

## 2018-07-24 DIAGNOSIS — I350 Nonrheumatic aortic (valve) stenosis: Secondary | ICD-10-CM

## 2018-07-24 DIAGNOSIS — Z87891 Personal history of nicotine dependence: Secondary | ICD-10-CM | POA: Diagnosis not present

## 2018-07-24 DIAGNOSIS — E785 Hyperlipidemia, unspecified: Secondary | ICD-10-CM | POA: Diagnosis not present

## 2018-07-24 DIAGNOSIS — R413 Other amnesia: Secondary | ICD-10-CM | POA: Diagnosis not present

## 2018-07-24 DIAGNOSIS — Z7951 Long term (current) use of inhaled steroids: Secondary | ICD-10-CM | POA: Diagnosis not present

## 2018-07-24 DIAGNOSIS — Z7982 Long term (current) use of aspirin: Secondary | ICD-10-CM | POA: Diagnosis not present

## 2018-07-24 DIAGNOSIS — M1990 Unspecified osteoarthritis, unspecified site: Secondary | ICD-10-CM | POA: Diagnosis not present

## 2018-07-24 DIAGNOSIS — J441 Chronic obstructive pulmonary disease with (acute) exacerbation: Secondary | ICD-10-CM | POA: Diagnosis not present

## 2018-07-24 DIAGNOSIS — I2584 Coronary atherosclerosis due to calcified coronary lesion: Secondary | ICD-10-CM | POA: Diagnosis not present

## 2018-07-24 DIAGNOSIS — Z9889 Other specified postprocedural states: Secondary | ICD-10-CM

## 2018-07-24 DIAGNOSIS — I25110 Atherosclerotic heart disease of native coronary artery with unstable angina pectoris: Secondary | ICD-10-CM | POA: Diagnosis not present

## 2018-07-24 LAB — BASIC METABOLIC PANEL
Anion gap: 7 (ref 5–15)
BUN: 17 mg/dL (ref 8–23)
CALCIUM: 8.3 mg/dL — AB (ref 8.9–10.3)
CO2: 21 mmol/L — AB (ref 22–32)
CREATININE: 0.87 mg/dL (ref 0.44–1.00)
Chloride: 109 mmol/L (ref 98–111)
GFR calc non Af Amer: 57 mL/min — ABNORMAL LOW (ref >60)
Glucose, Bld: 110 mg/dL — ABNORMAL HIGH (ref 70–99)
Potassium: 3.5 mmol/L (ref 3.5–5.1)
Sodium: 137 mmol/L (ref 135–145)

## 2018-07-24 LAB — CBC
HCT: 32.8 % — ABNORMAL LOW (ref 36.0–46.0)
Hemoglobin: 10.2 g/dL — ABNORMAL LOW (ref 12.0–15.0)
MCH: 30.3 pg (ref 26.0–34.0)
MCHC: 31.1 g/dL (ref 30.0–36.0)
MCV: 97.3 fL (ref 78.0–100.0)
PLATELETS: 209 10*3/uL (ref 150–400)
RBC: 3.37 MIL/uL — AB (ref 3.87–5.11)
RDW: 16.4 % — AB (ref 11.5–15.5)
WBC: 18 10*3/uL — ABNORMAL HIGH (ref 4.0–10.5)

## 2018-07-24 LAB — BRAIN NATRIURETIC PEPTIDE: B NATRIURETIC PEPTIDE 5: 623 pg/mL — AB (ref 0.0–100.0)

## 2018-07-24 MED ORDER — SODIUM CHLORIDE 0.9 % IV SOLN
INTRAVENOUS | Status: DC
  Administered 2018-07-24 (×2): via INTRAVENOUS

## 2018-07-24 MED ORDER — ALPRAZOLAM 0.25 MG PO TABS
0.1250 mg | ORAL_TABLET | Freq: Once | ORAL | Status: AC
  Administered 2018-07-24: 0.125 mg via ORAL
  Filled 2018-07-24: qty 1

## 2018-07-24 NOTE — Discharge Summary (Addendum)
The patient has been seen in conjunction with Fabian Sharp, PA-C. All aspects of care have been considered and discussed. The patient has been personally interviewed, examined, and all clinical data has been reviewed.   Delayed contrast reaction occurring greater than 12 hours after PCI resulting in pruritus and generalized erythema and associated hypotension into the 90s.  Hypotension improved with gentle hydration.  Skin reaction is improving this morning.  Spoke to daughter and recommended holding off on further contrast.  With next exposure to contrast, may need more prolonged/more intense steroid and antihistamine therapy.  Discharge Summary    Patient ID: Jessica [name] MRN: [id number]; DOB: [birth year]  Admit date: 07/23/2018 Discharge date: 07/25/2018  Primary Care Provider: [name], [name], MD  Primary Cardiologist: [name], MD , Dr. [name] (TAVR) Primary Electrophysiologist:  None   Discharge Diagnoses    Principal Problem:   Coronary artery disease involving native coronary artery of native heart with unstable angina pectoris [facility]) Active Problems:   Hyperlipidemia   COPD exacerbation ([name])   Severe aortic [name]   CAD (coronary artery disease)   S/P cardiac cath   Allergies Allergies  Allergen Reactions  . Contrast Media [Iodinated Diagnostic Agents] Itching and Rash  . Iohexol Itching and Rash    Diagnostic Studies/Procedures    Right and left heart cath 07/07/18:  Prox LAD lesion is 80% stenosed.  Ost 1st Mrg to 1st Mrg lesion is 40% stenosed.   1. Severe, heavily calcified (80%) [name] in the proximal LAD just before the takeoff the moderate caliber diagonal branch.  2. Mild non-obstructive disease in the Circumflex arter 3. The RCA is a large caliber vessel with no obstructive disease 4. Severe aortic valve [name] (peak to peak gradient 44 mmHg, mean gradient 42.1 mmHg, AVA 1.41 cm2)  Recommendations: Jessica aortic valve is heavily  calcified and was difficult to cross. The mean gradient suggests severe [name]. This is c/w data from Jessica echo which suggested severe aortic [name]. She will need TAVR. She will need PCI of the LAD [name] prior to TAVR. This will require orbital atherectomy given heavy calcification of the [name]. I will review timing with the TAVR team. She will also need to have dental extractions prior to TAVR. Will discuss with Dr. [name] and plan timing of dental extraction with PCI/stenting of the LAD.    Coronary atherectomy and stent 07/24/18  Prox LAD lesion is 80% stenosed.  Ost 1st Mrg to 1st Mrg lesion is 40% stenosed.  A drug-eluting stent was successfully placed using a STENT SYNERGY DES 3X16.  Post intervention, there is a 0% residual [name].   1. Severe [name] proximal to mid LAD 2. Successful PTCA/orbital atherectomy/DES x 1 proximal to mid LAD  Recommendations: Continue DAPT with ASA and Plavix for at least six months but 12 months if possible.   Recommend uninterrupted dual antiplatelet therapy with Aspirin 81mg  daily and Clopidogrel 75mg  daily for a minimum of 6 months (stable ischemic heart disease - Class I recommendation).    Echo 06/10/18: Study Conclusions - Left ventricle: The cavity size was normal. There was moderate   concentric hypertrophy. Systolic function was normal. The   estimated ejection fraction was in the range of 55% to 60%. Wall   motion was normal; there were no regional wall motion   abnormalities. Doppler parameters are consistent with abnormal   left ventricular relaxation (grade 1 diastolic dysfunction). - Aortic valve: There was severe [name]. Mean gradient (S): 49 mm  Hg. VTI ratio of LVOT to aortic valve: 0.15. Valve area (VTI):   0.47 cm^2. - Mitral valve: Calcified annulus. - Pulmonary arteries: Systolic pressure was mildly to moderately   increased. PA peak pressure: 45 mm Hg (S).  Impressions: - Progression of severe aortic  [name].   History of Present Illness     84 yo female with history of COPD, fibromyalgia, hyperlipidemia and aortic [name] was seen in clinic by Dr. [name] for further evaluation of Jessica [name] and discussion regarding TAVR. Jessica [name] has been moderate for the last several years. She has been followed by Dr. [name]. She has had recent worsening of Jessica dyspnea. Echo 06/10/18 with LVEF=55-60%, moderate LVH, grade 1 diastolic dysfunction. The aortic valve is thickened with severely thickened leaflets. Mean gradient 49 mmHg, peak gradient 80 mmHg. DVI 0.15. AVA 0.47 cm2. She has COPD. No known CAD.   She has been dyspneic with minimal exertion. No chest pain. No lower extremity edema. No dizziness. She still works in [employer]. She walks at home. She has a Pharmacist, community but has had recent problems with Jessica lower teeth. She has ongoing issues with several teeth. She lives with Jessica daughter in [name]. Jessica daughter is with Jessica today.   Hospital Course     Consultants:   Severe aortic [name], DES to pLAD She was seen by our TAVR team and opted to proceed with workup. Consequently, she underwent right and left heart cath on 06/25/18 that revealed 80% [name] of the LAD. This was treated with DES in a staged manner on 07/23/18. She has a contrast allergy and was premedicated. She was started on DAPT x 12 months with ASA and plavix. She tolerated the procedure well. However, on-call PAC was called to bedside for diffuse pleuritic rash. She was given additional IV benadryl and IV pepcid. She was visibly uncomfortable and hypotensive 07/24/18 and was observed overnight. On 07/25/18, she was ambulated and BP much improved.  Leukocytosis  WBC 18.0, afebrile. This is likely an acute response to Jessica premedication regemen, which includes IV steroids and possibly Jessica pleuritic rash that developed after cath. She denies trouble breathing, cough, and urinary symptoms. Repeat  CBC today with resolution of leukocytosis.  Hypotension Pressures marginal 07/24/18. With gentle hydration, pressures have improved and she is asymptomatic.   Contrast allergic reaction Pt developed whole body rash in the setting of premedication for contrast. She was given additional PO and IV benadryl and IV Pepcid. Per Dr. Tamala Julian, usually related to mast cell degranulation, manifested by generalized erythema and pruritus 12 to 24 hours after exposure despite premedication with steroids and antihistamine therapy. May need to consider longer duration of premedication therapy prior to contrast studies. Will delay CT scheduled for tomorrow. Proceed with PFTs tomorrow. TAVR team notified.    Pt was ambulated in the halls with nursing and maintained adequate blood pressure. Slight dizziness resolved. Pt would like to discharge home. Pt seen and examined by Dr. Tamala Julian and deemed stable for discharge. Follow up will be arranged.   _____________  Discharge Vitals Blood pressure (!) 104/54, pulse 83, temperature (!) 97.5 F (36.4 C), temperature source Oral, resp. rate 16, height 5\' 3"  (1.6 m), weight 61.7 kg, SpO2 91 %.  Filed Weights   07/23/18 0553 07/25/18 0421  Weight: 59.9 kg 61.7 kg    Labs & Radiologic Studies    CBC Recent Labs    07/24/18 0639 07/25/18 0418  WBC 18.0* 9.1  HGB 10.2*  10.9*  HCT 32.8* 36.0  MCV 97.3 100.3*  PLT 209 630   Basic Metabolic Panel Recent Labs    07/24/18 0232 07/25/18 0418  NA 137 138  K 3.5 3.8  CL 109 109  CO2 21* 24  GLUCOSE 110* 88  BUN 17 9  CREATININE 0.87 0.85  CALCIUM 8.3* 8.5*   Liver Function Tests No results for input(s): AST, ALT, ALKPHOS, BILITOT, PROT, ALBUMIN in the last 72 hours. No results for input(s): LIPASE, AMYLASE in the last 72 hours. Cardiac Enzymes No results for input(s): CKTOTAL, CKMB, CKMBINDEX, TROPONINI in the last 72 hours. BNP Invalid input(s): POCBNP D-Dimer No results for input(s): DDIMER in the  last 72 hours. Hemoglobin A1C No results for input(s): HGBA1C in the last 72 hours. Fasting Lipid Panel No results for input(s): CHOL, HDL, LDLCALC, TRIG, CHOLHDL, LDLDIRECT in the last 72 hours. Thyroid Function Tests No results for input(s): TSH, T4TOTAL, T3FREE, THYROIDAB in the last 72 hours.  Invalid input(s): FREET3 _____________  No results found. Disposition   Pt is being discharged home today in good condition.  Follow-up Plans & Appointments    Follow-up Information    [name], MD Follow up in 2 week(s).   Specialty:  Cardiology Contact information: [place] [id number] [phone]          Discharge Instructions    Amb Referral to Cardiac Rehabilitation   Complete by:  As directed    Diagnosis:  Coronary Stents   Diet - low sodium heart healthy   Complete by:  As directed    Increase activity slowly   Complete by:  As directed       Discharge Medications   Allergies as of 07/25/2018      Reactions   Contrast Media [iodinated Diagnostic Agents] Itching, Rash   Iohexol Itching, Rash      Medication List    STOP taking these medications   predniSONE 20 MG tablet Commonly known as:  DELTASONE   predniSONE 50 MG tablet Commonly known as:  DELTASONE     TAKE these medications   albuterol (2.5 MG/3ML) 0.083% nebulizer solution Commonly known as:  PROVENTIL Take 3 mLs (2.5 mg total) by nebulization every 4 (four) hours as needed for wheezing or shortness of breath. DX: COPD J44.9   albuterol 108 (90 Base) MCG/ACT inhaler Commonly known as:  PROVENTIL HFA;VENTOLIN HFA Inhale 2 puffs into the lungs every 6 (six) hours as needed for wheezing or shortness of breath.   ALPRAZolam 0.25 MG tablet Commonly known as:  XANAX TAKE 1/2 TO 1 TABLET BY MOUTH TWICE DAILY AS NEEDED. NO MORE THAN 45 TABS PER MONTH What changed:    how much to take  how to take this  when to take this   arformoterol 15 MCG/2ML  [place] Commonly known as:  BROVANA Take 2 mLs (15 mcg total) by nebulization 2 (two) times daily. DX: COPD J44.9   aspirin 81 MG tablet Take 81 mg by mouth every evening.   budesonide 0.5 MG/2ML nebulizer solution Commonly known as:  PULMICORT Take 2 mLs (0.5 mg total) by nebulization 2 (two) times daily. DX: COPD J44.9   clopidogrel 75 MG tablet Commonly known as:  PLAVIX Take 1 tablet (75 mg total) by mouth daily.   escitalopram 20 MG tablet Commonly known as:  LEXAPRO Take 1 tablet (20 mg total) by mouth daily.   multivitamin-lutein Caps capsule Take 1 capsule by mouth daily.  simvastatin 20 MG tablet Commonly known as:  ZOCOR Take 1 tablet (20 mg total) by mouth at bedtime.   traZODone 50 MG tablet Commonly known as:  DESYREL Take 0.5-1 tablets (25-50 mg total) by mouth at bedtime as needed for sleep.   Vitamin D 1000 units capsule Take 1,000 Units by mouth daily.        Acute coronary syndrome (MI, NSTEMI, STEMI, etc) this admission?: No.    Outstanding Labs/Studies   Follow up with TAVR team for timing of CT scan.   Duration of Discharge Encounter   Greater than 30 minutes including physician time.  Signed, Tami Lin Duke, PA 07/25/2018, 11:26 AM

## 2018-07-24 NOTE — Progress Notes (Addendum)
The patient has been seen in conjunction with Fabian Sharp, PA-C. All aspects of care have been considered and discussed. The patient has been personally interviewed, examined, and all clinical data has been reviewed.   Careful hydration.  At time of right heart cath pulmonary wedge pressure was relatively low.  Contrast reaction with generalized erythema of skin this morning and hypotension subsequently.  Blood pressure responded to IV fluids.  Once systolic pressure is stable above 90 will cut back/DC IV fluid.  Plan observation in hospital at least another 24 hours before consideration of discharge.  Basic metabolic panel in a.m., hemoglobin in a.m.  Baseline BMP will also be obtained.    Progress Note  Patient Name: Jessica [name] Date of Encounter: 07/24/2018  Primary Cardiologist: [name], MD   Subjective   Pt developed full body rash yesterday. Given additional medications for contrast allergy.   Inpatient Medications    Scheduled Meds: . arformoterol  15 mcg Nebulization BID  . aspirin EC  81 mg Oral q morning - 10a  . budesonide  0.5 mg Nebulization BID  . clopidogrel  75 mg Oral Daily  . diphenhydrAMINE  25 mg Intravenous Once  . escitalopram  20 mg Oral Daily  . simvastatin  20 mg Oral QHS  . sodium chloride flush  3 mL Intravenous Q12H   Continuous Infusions: . sodium chloride     PRN Meds: sodium chloride, acetaminophen, albuterol, ondansetron (ZOFRAN) IV, sodium chloride flush   Vital Signs    Vitals:   07/24/18 0000 07/24/18 0400 07/24/18 0706 07/24/18 0721  BP: (!) 104/39 (!) 94/38 (!) 96/41 (!) 77/39  Pulse: 84  79   Resp: 20  18 12   Temp:  98.4 F (36.9 C) 98.7 F (37.1 C)   TempSrc:  Oral Oral   SpO2: 96%  95%   Weight:      Height:        Intake/Output Summary (Last 24 hours) at 07/24/2018 0805 Last data filed at 07/23/2018 2350 Gross per 24 hour  Intake 1045.61 ml  Output 200 ml  Net 845.61 ml   Filed Weights   07/23/18  0553  Weight: 59.9 kg    Telemetry    sinus - Personally Reviewed  ECG    No new tracings - Personally Reviewed  Physical Exam   GEN: No acute distress.   Neck: No JVD Cardiac: RRR, + aortic murmur Respiratory: Right lower lung field with crackles GI: Soft, nontender, non-distended  MS: No edema; No deformity. Neuro:  Nonfocal  Psych: Normal affect   Labs    Chemistry Recent Labs  Lab 07/24/18 0232  NA 137  K 3.5  CL 109  CO2 21*  GLUCOSE 110*  BUN 17  CREATININE 0.87  CALCIUM 8.3*  GFRNONAA 57*  GFRAA >60  ANIONGAP 7     Hematology Recent Labs  Lab 07/24/18 0639  WBC 18.0*  RBC 3.37*  HGB 10.2*  HCT 32.8*  MCV 97.3  MCH 30.3  MCHC 31.1  RDW 16.4*  PLT 209    Cardiac EnzymesNo results for input(s): TROPONINI in the last 168 hours. No results for input(s): TROPIPOC in the last 168 hours.   BNPNo results for input(s): BNP, PROBNP in the last 168 hours.   DDimer No results for input(s): DDIMER in the last 168 hours.   Radiology    No results found.  Cardiac Studies   Right and left heart cath 07/07/18:  Prox LAD lesion  is 80% stenosed.  Ost 1st Mrg to 1st Mrg lesion is 40% stenosed.  1. Severe, heavily calcified (80%) stenosis in the proximal LAD just before the takeoff the moderate caliber diagonal branch.  2. Mild non-obstructive disease in the Circumflex arter 3. The RCA is a large caliber vessel with no obstructive disease 4. Severe aortic valve stenosis (peak to peak gradient 44 mmHg, mean gradient 42.1 mmHg, AVA 1.41 cm2)  Recommendations: Her aortic valve is heavily calcified and was difficult to cross. The mean gradient suggests severe stenosis. This is c/w data from her echo which suggested severe aortic stenosis. She will need TAVR. She will need PCI of the LAD stenosis prior to TAVR. This will require orbital atherectomy given heavy calcification of the stenosis. I will review timing with the TAVR team. She will also need to  have dental extractions prior to TAVR. Will discuss with Dr. [name] and plan timing of dental extraction with PCI/stenting of the LAD.    Coronary atherectomy and stent 07/24/18  Prox LAD lesion is 80% stenosed.  Ost 1st Mrg to 1st Mrg lesion is 40% stenosed.  A drug-eluting stent was successfully placed using a STENT SYNERGY DES 3X16.  Post intervention, there is a 0% residual stenosis.  1. Severe stenosis proximal to mid LAD 2. Successful PTCA/orbital atherectomy/DES x 1 proximal to mid LAD  Recommendations: Continue DAPT with ASA and Plavix for at least six months but 12 months if possible.   Recommend uninterrupted dual antiplatelet therapy with Aspirin 81mg  daily and Clopidogrel 75mg  dailyfor a minimum of 6 months (stable ischemic heart disease - Class I recommendation).    Echo 06/10/18: Study Conclusions - Left ventricle: The cavity size was normal. There was moderate concentric hypertrophy. Systolic function was normal. The estimated ejection fraction was in the range of 55% to 60%. Wall motion was normal; there were no regional wall motion abnormalities. Doppler parameters are consistent with abnormal left ventricular relaxation (grade 1 diastolic dysfunction). - Aortic valve: There was severe stenosis. Mean gradient (S): 49 mm Hg. VTI ratio of LVOT to aortic valve: 0.15. Valve area (VTI): 0.47 cm^2. - Mitral valve: Calcified annulus. - Pulmonary arteries: Systolic pressure was mildly to moderately increased. PA peak pressure: 45 mm Hg (S).  Impressions: - Progression of severe aortic stenosis.  Patient Profile     84 y.o. female with PMH significant for COPD, fibromyalgia, and aortic stenosis being worked up for TAVT procedure and underwent  Heart cath.  Assessment & Plan    Severe aortic stenosis, DES to pLAD She was seen by our TAVR team and opted to proceed with workup. Consequently, she underwent right and left heart cath on 06/25/18  that revealed 80% stenosis of the LAD. This was treated with DES in a staged manner on 07/23/18. She has a contrast allergy and was premedicated. She was started on DAPT x 12 months with ASA and plavix. She tolerated the procedure well. However, on-call PAC was called to bedside for diffuse pleuritic rash. She was given additional IV benadryl and IV pepcid. She continues to have rash this morning.  Leukocytosis  WBC 18.0, afebrile. This is likely an acute response to her premedication regemen, which includes IV steroids and possibly her pleuritic rash that developed after cath. She denies trouble breathing, cough, and urinary symptoms. Will repeat CBC tomorrow morning.   Hypotension Pressures marginal this morning. Will monitor throughout the morning. May be in response to contrast allergy medications in the setting of severe AS. Will  continue gentle fluid hydration.  Contrast allergic reaction Pt developed whole body rash in the setting of premedication for contrast. She was given additional PO and IV benadryl and IV Pepcid.     For questions or updates, please contact [place] Please consult [email or web address] for contact info under        Signed, Ledora Bottcher, PA  07/24/2018, 8:05 AM

## 2018-07-24 NOTE — Progress Notes (Signed)
CARDIAC REHAB PHASE I   Pt not ambulated due to hypotension. Pt looks very uncomfortable in recliner, unable to sit still, but declining to be helped back to bed. Stent education completed with pt. Pt educated on importance of ASA, Plavix, and NTG. Encouraged to take it easy, as pt in getting a work-up for a TAVR. Will send CRP II referral to [place], but pt unable to attend at this time.  [id number] Rufina Falco, RN BSN 07/24/2018 8:32 AM

## 2018-07-25 ENCOUNTER — Other Ambulatory Visit: Payer: Self-pay | Admitting: Physician Assistant

## 2018-07-25 DIAGNOSIS — I25110 Atherosclerotic heart disease of native coronary artery with unstable angina pectoris: Secondary | ICD-10-CM | POA: Diagnosis not present

## 2018-07-25 DIAGNOSIS — E785 Hyperlipidemia, unspecified: Secondary | ICD-10-CM | POA: Diagnosis not present

## 2018-07-25 DIAGNOSIS — E782 Mixed hyperlipidemia: Secondary | ICD-10-CM

## 2018-07-25 DIAGNOSIS — Z91041 Radiographic dye allergy status: Secondary | ICD-10-CM | POA: Diagnosis not present

## 2018-07-25 DIAGNOSIS — I350 Nonrheumatic aortic (valve) stenosis: Secondary | ICD-10-CM | POA: Diagnosis not present

## 2018-07-25 DIAGNOSIS — I872 Venous insufficiency (chronic) (peripheral): Secondary | ICD-10-CM | POA: Diagnosis not present

## 2018-07-25 DIAGNOSIS — I2510 Atherosclerotic heart disease of native coronary artery without angina pectoris: Secondary | ICD-10-CM

## 2018-07-25 DIAGNOSIS — I2584 Coronary atherosclerosis due to calcified coronary lesion: Secondary | ICD-10-CM | POA: Diagnosis not present

## 2018-07-25 DIAGNOSIS — J441 Chronic obstructive pulmonary disease with (acute) exacerbation: Secondary | ICD-10-CM | POA: Diagnosis not present

## 2018-07-25 DIAGNOSIS — F419 Anxiety disorder, unspecified: Secondary | ICD-10-CM | POA: Diagnosis not present

## 2018-07-25 LAB — BASIC METABOLIC PANEL
Anion gap: 5 (ref 5–15)
BUN: 9 mg/dL (ref 8–23)
CALCIUM: 8.5 mg/dL — AB (ref 8.9–10.3)
CO2: 24 mmol/L (ref 22–32)
CREATININE: 0.85 mg/dL (ref 0.44–1.00)
Chloride: 109 mmol/L (ref 98–111)
GFR calc non Af Amer: 59 mL/min — ABNORMAL LOW (ref >60)
Glucose, Bld: 88 mg/dL (ref 70–99)
Potassium: 3.8 mmol/L (ref 3.5–5.1)
SODIUM: 138 mmol/L (ref 135–145)

## 2018-07-25 LAB — CBC
HCT: 36 % (ref 36.0–46.0)
Hemoglobin: 10.9 g/dL — ABNORMAL LOW (ref 12.0–15.0)
MCH: 30.4 pg (ref 26.0–34.0)
MCHC: 30.3 g/dL (ref 30.0–36.0)
MCV: 100.3 fL — ABNORMAL HIGH (ref 78.0–100.0)
PLATELETS: 236 10*3/uL (ref 150–400)
RBC: 3.59 MIL/uL — ABNORMAL LOW (ref 3.87–5.11)
RDW: 16.5 % — AB (ref 11.5–15.5)
WBC: 9.1 10*3/uL (ref 4.0–10.5)

## 2018-07-25 NOTE — Progress Notes (Addendum)
Progress Note  Patient Name: Jessica [name] Date of Encounter: 07/25/2018  Primary Cardiologist: [name], MD [facility]  Subjective   Feels well this morning.  Mild itching but markedly improved compared to yesterday.  Has dyspnea on exertion, present for months.  Denies orthopnea.  Blood pressure has recovered back to a reasonable range.  Inpatient Medications    Scheduled Meds: . arformoterol  15 mcg Nebulization BID  . aspirin EC  81 mg Oral q morning - 10a  . budesonide  0.5 mg Nebulization BID  . clopidogrel  75 mg Oral Daily  . diphenhydrAMINE  25 mg Intravenous Once  . escitalopram  20 mg Oral Daily  . simvastatin  20 mg Oral QHS  . sodium chloride flush  3 mL Intravenous Q12H   Continuous Infusions: . sodium chloride    . sodium chloride 30 mL/hr at 07/25/18 0300   PRN Meds: sodium chloride, acetaminophen, albuterol, ondansetron (ZOFRAN) IV, sodium chloride flush   Vital Signs    Vitals:   07/25/18 0421 07/25/18 0437 07/25/18 0820 07/25/18 0824  BP:  136/63    Pulse:  76 83   Resp:  18 16   Temp:  (!) 97.5 F (36.4 C)    TempSrc:  Oral    SpO2:  94% 91% 91%  Weight: 61.7 kg     Height:        Intake/Output Summary (Last 24 hours) at 07/25/2018 1056 Last data filed at 07/25/2018 1007 Gross per 24 hour  Intake 1638.99 ml  Output -  Net 1638.99 ml   Filed Weights   07/23/18 0553 07/25/18 0421  Weight: 59.9 kg 61.7 kg    Telemetry    Sinus rhythm with PACs - Personally Reviewed  ECG    Performed on 07/24/2018 demonstrates QS pattern V1 through V3, interventricular conduction delay, normal sinus rhythm.- Personally Reviewed  Physical Exam  Less erythema of skin especially in the face.  Appears younger than stated age. GEN: No acute distress.  Erythema of arms Neck: No JVD Cardiac: RRR, coarse high-pitched for 2 5/6 crescendo decrescendo aortic stenosis murmur.  S4 gallop gallops.  Respiratory: Clear to auscultation bilaterally. GI:  Soft, nontender, non-distended  MS: No edema; No deformity. Neuro:  Nonfocal  Psych: Normal affect   Labs    Chemistry Recent Labs  Lab 07/24/18 0232 07/25/18 0418  NA 137 138  K 3.5 3.8  CL 109 109  CO2 21* 24  GLUCOSE 110* 88  BUN 17 9  CREATININE 0.87 0.85  CALCIUM 8.3* 8.5*  GFRNONAA 57* 59*  GFRAA >60 >60  ANIONGAP 7 5     Hematology Recent Labs  Lab 07/24/18 0639 07/25/18 0418  WBC 18.0* 9.1  RBC 3.37* 3.59*  HGB 10.2* 10.9*  HCT 32.8* 36.0  MCV 97.3 100.3*  MCH 30.3 30.4  MCHC 31.1 30.3  RDW 16.4* 16.5*  PLT 209 236    Cardiac EnzymesNo results for input(s): TROPONINI in the last 168 hours. No results for input(s): TROPIPOC in the last 168 hours.   BNP Recent Labs  Lab 07/24/18 0639  BNP 623.0*     DDimer No results for input(s): DDIMER in the last 168 hours.   Radiology    No results found.  Cardiac Studies   None other than angioplasty report as outlined yesterday.  Patient Profile     84 y.o. female with PMH significant for COPD, fibromyalgia, and aortic stenosis being worked up for TAVR procedure and underwent  Heart cath.  Contrast reaction despite preprocedure steroid and antihistamine therapy resulting in diffuse erythema of skin and pruritus.  Assessment & Plan    1. Calcified proximal CAD treated with orbital atherectomy and stenting with excellent result this admission 2. Delayed contrast reaction usually related to mast cell degranulation, manifested by generalized erythema and pruritus 12 to 24 hours after exposure despite premedication with steroids and antihistamine therapy. 3. Critical aortic stenosis, hold off on contrast studies scheduled for tomorrow to allow recovery from recent immune reaction and consider higher dose or more prolonged pretreatment prep prior to the next exposure. 4. Hypotension, resolved after gentle hydration yesterday.  Spoke with daughter.  Patient is to have PFTs tomorrow.  I have asked him to  defer further contrast studies until further recommendation from Dr. [name] and the heart valve team.  Discharge today if ambulates without significant difficulty.  For questions or updates, please contact [place] Please consult [email or web address] for contact info under        Signed, Sinclair Grooms, MD  07/25/2018, 10:56 AM

## 2018-07-25 NOTE — Progress Notes (Signed)
Patient and family received discharge information and acknowledged understanding of it. Patient IV was removed.  

## 2018-07-25 NOTE — Progress Notes (Signed)
Patient ambulated using a walker in hall about 18ft with RN. Patient c/o of slight dizziness at the beginning of walk but resolved. Patient had some episodes of SOB and resolved with a break. Patient BP after walk was 104/54. Will continue to monitor.

## 2018-07-26 ENCOUNTER — Ambulatory Visit (HOSPITAL_BASED_OUTPATIENT_CLINIC_OR_DEPARTMENT_OTHER)
Admission: RE | Admit: 2018-07-26 | Discharge: 2018-07-26 | Disposition: A | Discharge status: Home / Self Care | Payer: Medicare Other | Source: Ambulatory Visit | Attending: Cardiovascular Disease | Admitting: Cardiovascular Disease

## 2018-07-26 ENCOUNTER — Telehealth: Payer: Self-pay | Admitting: Cardiovascular Disease

## 2018-07-26 ENCOUNTER — Ambulatory Visit (HOSPITAL_COMMUNITY): Payer: Medicare Other

## 2018-07-26 ENCOUNTER — Ambulatory Visit (HOSPITAL_COMMUNITY)
Admission: RE | Admit: 2018-07-26 | Discharge: 2018-07-26 | Disposition: A | Discharge status: Home / Self Care | Payer: Medicare Other | Source: Ambulatory Visit | Attending: Cardiovascular Disease | Admitting: Cardiovascular Disease

## 2018-07-26 ENCOUNTER — Ambulatory Visit (HOSPITAL_COMMUNITY): Admission: RE | Admit: 2018-07-26 | Payer: Medicare Other | Source: Ambulatory Visit

## 2018-07-26 DIAGNOSIS — I350 Nonrheumatic aortic (valve) stenosis: Secondary | ICD-10-CM | POA: Insufficient documentation

## 2018-07-26 DIAGNOSIS — J449 Chronic obstructive pulmonary disease, unspecified: Secondary | ICD-10-CM | POA: Diagnosis not present

## 2018-07-26 DIAGNOSIS — Z79899 Other long term (current) drug therapy: Secondary | ICD-10-CM | POA: Insufficient documentation

## 2018-07-26 DIAGNOSIS — R0609 Other forms of dyspnea: Secondary | ICD-10-CM | POA: Insufficient documentation

## 2018-07-26 DIAGNOSIS — R918 Other nonspecific abnormal finding of lung field: Secondary | ICD-10-CM | POA: Insufficient documentation

## 2018-07-26 DIAGNOSIS — Z87891 Personal history of nicotine dependence: Secondary | ICD-10-CM | POA: Insufficient documentation

## 2018-07-26 LAB — PULMONARY FUNCTION TEST
DL/VA % pred: 74 %
DL/VA: 3.5 ml/min/mmHg/L
DLCO UNC % PRED: 36 %
DLCO UNC: 8.3 ml/min/mmHg
FEF 25-75 PRE: 0.32 L/s
FEF 25-75 Post: 0.47 L/sec
FEF2575-%Change-Post: 46 %
FEF2575-%Pred-Post: 59 %
FEF2575-%Pred-Pre: 40 %
FEV1-%Change-Post: 17 %
FEV1-%PRED-POST: 67 %
FEV1-%PRED-PRE: 56 %
FEV1-POST: 0.98 L
FEV1-Pre: 0.83 L
FEV1FVC-%Change-Post: 0 %
FEV1FVC-%Pred-Pre: 68 %
FEV6-%CHANGE-POST: 13 %
FEV6-%PRED-POST: 97 %
FEV6-%Pred-Pre: 85 %
FEV6-POST: 1.81 L
FEV6-PRE: 1.59 L
FEV6FVC-%Change-Post: -2 %
FEV6FVC-%PRED-POST: 98 %
FEV6FVC-%PRED-PRE: 101 %
FVC-%Change-Post: 16 %
FVC-%Pred-Post: 98 %
FVC-%Pred-Pre: 84 %
FVC-Post: 1.98 L
FVC-Pre: 1.69 L
POST FEV1/FVC RATIO: 50 %
POST FEV6/FVC RATIO: 92 %
PRE FEV6/FVC RATIO: 94 %
Pre FEV1/FVC ratio: 49 %
RV % PRED: 128 %
RV: 3.28 L
TLC % PRED: 103 %
TLC: 5.06 L

## 2018-07-26 MED ORDER — ALBUTEROL SULFATE (2.5 MG/3ML) 0.083% IN NEBU
2.5000 mg | INHALATION_SOLUTION | Freq: Once | RESPIRATORY_TRACT | Status: AC
  Administered 2018-07-26: 2.5 mg via RESPIRATORY_TRACT

## 2018-07-26 NOTE — Telephone Encounter (Signed)
Patient is scheduled 08/16/18 for TCM  Please call

## 2018-07-26 NOTE — Telephone Encounter (Signed)
-----   Message from Jessica [name], [place] sent at 07/25/2018 10:55 AM EDT ----- Pt discharging today and will need follow up appt with Dr. [name] in 2-3 weeks.  Please let patient know when appt is scheduled.  Thanks Angie

## 2018-07-26 NOTE — Telephone Encounter (Signed)
Left a message for the patient to call back.  

## 2018-07-26 NOTE — Progress Notes (Signed)
Carotid artery duplex has been completed. 1-39% ICA stenosis bilaterally.  07/26/18 5:03 PM Carlos Levering RVT

## 2018-07-27 NOTE — Telephone Encounter (Signed)
I left a message for the patient to call at her home #.

## 2018-07-28 NOTE — Telephone Encounter (Signed)
No answer. Left message to call back.  3rd attempt unsuccessful.  Closing encounter.

## 2018-08-02 ENCOUNTER — Ambulatory Visit: Payer: Medicare Other | Admitting: Physical Therapy

## 2018-08-02 ENCOUNTER — Encounter: Payer: Medicare Other | Admitting: Thoracic Surgery (Cardiothoracic Vascular Surgery)

## 2018-08-13 ENCOUNTER — Ambulatory Visit: Payer: Medicare Other | Admitting: Cardiovascular Disease

## 2018-08-16 ENCOUNTER — Ambulatory Visit: Payer: Medicare Other | Admitting: Cardiovascular Disease

## 2018-08-19 ENCOUNTER — Other Ambulatory Visit (HOSPITAL_COMMUNITY): Payer: Self-pay

## 2018-08-19 ENCOUNTER — Telehealth: Payer: Self-pay

## 2018-08-19 MED ORDER — PREDNISONE 50 MG PO TABS: ORAL_TABLET | ORAL | 0 refills | Status: DC

## 2018-08-19 NOTE — Telephone Encounter (Signed)
Plans for TAVR CT scans:   The pt is scheduled for TAVR CT scans on 08/24/18 at 4:30 PM Claiborne Billings Revels scheduled and is aware of contrast allergy and that the pt will be admitted to [facility] the morning of scans). Due to severe contrast allergy the pt will require admission from home the morning of 08/24/18. I spoke with Shirlean Mylar in patient placement at [facility] and made her aware of this information.  She has put the pt on list to be contacted the morning of 10/29 to come into the hospital for Observation, Telemetry bed, Dx Severe Aortic Stenosis, Admitting MD-Dr [name].  Patient placement will contact [place], the pt's daughter, at [phone] when a bed becomes available.   I spoke with [name] and made her aware of plan.  The pt will begin premedication at home.  3:30 AM 08/24/18 Prednisone 50mg , Benadryl 50mg  and Pepcid 20 mg  9:30 AM 08/24/18 Prednisone 50mg , Benadryl 50mg   Rx for Prednisone sent to the pharmacy and [name] will get Benadryl and Pepcid OTC.   [name] was also advised to call Nell Range PA-C when the pt is on the way to the hospital.  Joellen Jersey will place orders on arrival for BMP and 18 G AC. 1 hour prior to the CT scan the pt will need Solumedrol 125mg , Benadryl 50mg  and Pepcid 20mg .   The pt's case was previously discussed with [facility] and Dr [name]. Megan advised Prednisone 50mg  and Benadryl 50mg  at 13 hours, 7 hours, and 1 hour before, and Pepcid 20mg  at 13 hours and 1 hour before procedure. Then continue same protocol after prednisone and Benadryl every 6 hours and Pepcid every 12 hours for a total of 24 hours after CT. Jinny Blossom also said Solumedrol 125mg  can replace any of the oral prednisone doses before/after if the pt is already at the hospital.

## 2018-08-19 NOTE — Telephone Encounter (Signed)
Thanks Lauren.   Chris 

## 2018-08-23 ENCOUNTER — Encounter: Payer: Medicare Other | Admitting: Thoracic Surgery (Cardiothoracic Vascular Surgery)

## 2018-08-23 ENCOUNTER — Other Ambulatory Visit (HOSPITAL_COMMUNITY): Payer: Self-pay

## 2018-08-24 ENCOUNTER — Encounter (HOSPITAL_COMMUNITY): Payer: Self-pay | Admitting: General Practice

## 2018-08-24 ENCOUNTER — Ambulatory Visit (HOSPITAL_COMMUNITY)
Admission: RE | Admit: 2018-08-24 | Discharge: 2018-08-24 | Disposition: A | Discharge status: Home / Self Care | Payer: Medicare Other | Source: Ambulatory Visit | Attending: Cardiovascular Disease | Admitting: Cardiovascular Disease

## 2018-08-24 ENCOUNTER — Ambulatory Visit (HOSPITAL_COMMUNITY): Payer: Medicare Other

## 2018-08-24 ENCOUNTER — Other Ambulatory Visit: Payer: Self-pay

## 2018-08-24 ENCOUNTER — Other Ambulatory Visit (HOSPITAL_COMMUNITY): Payer: Self-pay

## 2018-08-24 ENCOUNTER — Inpatient Hospital Stay (HOSPITAL_COMMUNITY)
Admission: AD | Admit: 2018-08-24 | Discharge: 2018-08-27 | DRG: 306 | Disposition: A | Discharge status: Home / Self Care | Payer: Medicare Other | Attending: Cardiovascular Disease | Admitting: Cardiovascular Disease

## 2018-08-24 DIAGNOSIS — H5501 Congenital nystagmus: Secondary | ICD-10-CM | POA: Diagnosis present

## 2018-08-24 DIAGNOSIS — J432 Centrilobular emphysema: Secondary | ICD-10-CM | POA: Insufficient documentation

## 2018-08-24 DIAGNOSIS — Z7982 Long term (current) use of aspirin: Secondary | ICD-10-CM

## 2018-08-24 DIAGNOSIS — I872 Venous insufficiency (chronic) (peripheral): Secondary | ICD-10-CM | POA: Diagnosis present

## 2018-08-24 DIAGNOSIS — R410 Disorientation, unspecified: Secondary | ICD-10-CM | POA: Diagnosis not present

## 2018-08-24 DIAGNOSIS — Z79899 Other long term (current) drug therapy: Secondary | ICD-10-CM

## 2018-08-24 DIAGNOSIS — K5730 Diverticulosis of large intestine without perforation or abscess without bleeding: Secondary | ICD-10-CM

## 2018-08-24 DIAGNOSIS — I350 Nonrheumatic aortic (valve) stenosis: Secondary | ICD-10-CM

## 2018-08-24 DIAGNOSIS — E782 Mixed hyperlipidemia: Secondary | ICD-10-CM | POA: Diagnosis present

## 2018-08-24 DIAGNOSIS — Z888 Allergy status to other drugs, medicaments and biological substances status: Secondary | ICD-10-CM

## 2018-08-24 DIAGNOSIS — I2699 Other pulmonary embolism without acute cor pulmonale: Secondary | ICD-10-CM

## 2018-08-24 DIAGNOSIS — I82411 Acute embolism and thrombosis of right femoral vein: Secondary | ICD-10-CM | POA: Diagnosis present

## 2018-08-24 DIAGNOSIS — I2510 Atherosclerotic heart disease of native coronary artery without angina pectoris: Secondary | ICD-10-CM | POA: Insufficient documentation

## 2018-08-24 DIAGNOSIS — F411 Generalized anxiety disorder: Secondary | ICD-10-CM | POA: Diagnosis present

## 2018-08-24 DIAGNOSIS — J449 Chronic obstructive pulmonary disease, unspecified: Secondary | ICD-10-CM | POA: Diagnosis present

## 2018-08-24 DIAGNOSIS — Z90710 Acquired absence of both cervix and uterus: Secondary | ICD-10-CM

## 2018-08-24 DIAGNOSIS — R911 Solitary pulmonary nodule: Secondary | ICD-10-CM

## 2018-08-24 DIAGNOSIS — Z01810 Encounter for preprocedural cardiovascular examination: Secondary | ICD-10-CM

## 2018-08-24 DIAGNOSIS — Z7902 Long term (current) use of antithrombotics/antiplatelets: Secondary | ICD-10-CM

## 2018-08-24 DIAGNOSIS — R0609 Other forms of dyspnea: Secondary | ICD-10-CM

## 2018-08-24 DIAGNOSIS — E785 Hyperlipidemia, unspecified: Secondary | ICD-10-CM | POA: Diagnosis present

## 2018-08-24 DIAGNOSIS — Z9849 Cataract extraction status, unspecified eye: Secondary | ICD-10-CM

## 2018-08-24 DIAGNOSIS — Z87891 Personal history of nicotine dependence: Secondary | ICD-10-CM

## 2018-08-24 DIAGNOSIS — Z7951 Long term (current) use of inhaled steroids: Secondary | ICD-10-CM

## 2018-08-24 DIAGNOSIS — I82431 Acute embolism and thrombosis of right popliteal vein: Secondary | ICD-10-CM | POA: Diagnosis present

## 2018-08-24 DIAGNOSIS — I82441 Acute embolism and thrombosis of right tibial vein: Secondary | ICD-10-CM | POA: Diagnosis present

## 2018-08-24 DIAGNOSIS — I700 Atherosclerosis of aorta: Secondary | ICD-10-CM | POA: Diagnosis present

## 2018-08-24 DIAGNOSIS — Z86711 Personal history of pulmonary embolism: Secondary | ICD-10-CM

## 2018-08-24 DIAGNOSIS — R011 Cardiac murmur, unspecified: Secondary | ICD-10-CM | POA: Diagnosis present

## 2018-08-24 DIAGNOSIS — K869 Disease of pancreas, unspecified: Secondary | ICD-10-CM | POA: Insufficient documentation

## 2018-08-24 DIAGNOSIS — M797 Fibromyalgia: Secondary | ICD-10-CM | POA: Diagnosis present

## 2018-08-24 DIAGNOSIS — I82451 Acute embolism and thrombosis of right peroneal vein: Secondary | ICD-10-CM | POA: Diagnosis present

## 2018-08-24 DIAGNOSIS — J42 Unspecified chronic bronchitis: Secondary | ICD-10-CM | POA: Diagnosis present

## 2018-08-24 DIAGNOSIS — G4700 Insomnia, unspecified: Secondary | ICD-10-CM | POA: Diagnosis present

## 2018-08-24 DIAGNOSIS — Z91041 Radiographic dye allergy status: Secondary | ICD-10-CM

## 2018-08-24 DIAGNOSIS — M1990 Unspecified osteoarthritis, unspecified site: Secondary | ICD-10-CM | POA: Diagnosis present

## 2018-08-24 DIAGNOSIS — Z955 Presence of coronary angioplasty implant and graft: Secondary | ICD-10-CM

## 2018-08-24 DIAGNOSIS — F419 Anxiety disorder, unspecified: Secondary | ICD-10-CM | POA: Diagnosis present

## 2018-08-24 DIAGNOSIS — I25118 Atherosclerotic heart disease of native coronary artery with other forms of angina pectoris: Secondary | ICD-10-CM | POA: Diagnosis present

## 2018-08-24 LAB — BASIC METABOLIC PANEL
Anion gap: 9 (ref 5–15)
BUN: 10 mg/dL (ref 8–23)
CHLORIDE: 107 mmol/L (ref 98–111)
CO2: 23 mmol/L (ref 22–32)
CREATININE: 0.91 mg/dL (ref 0.44–1.00)
Calcium: 9.4 mg/dL (ref 8.9–10.3)
GFR calc Af Amer: >60 mL/min (ref >60)
GFR calc non Af Amer: 54 mL/min — ABNORMAL LOW (ref >60)
GLUCOSE: 149 mg/dL — AB (ref 70–99)
Potassium: 4.3 mmol/L (ref 3.5–5.1)
SODIUM: 139 mmol/L (ref 135–145)

## 2018-08-24 MED ORDER — METHYLPREDNISOLONE SODIUM SUCC 125 MG IJ SOLR
125.0000 mg | Freq: Once | INTRAMUSCULAR | Status: AC
  Administered 2018-08-24: 125 mg via INTRAVENOUS
  Filled 2018-08-24: qty 2

## 2018-08-24 MED ORDER — FAMOTIDINE 20 MG PO TABS
20.0000 mg | ORAL_TABLET | Freq: Once | ORAL | Status: AC
  Administered 2018-08-24: 20 mg via ORAL
  Filled 2018-08-24: qty 1

## 2018-08-24 MED ORDER — DIPHENHYDRAMINE HCL 25 MG PO CAPS
50.0000 mg | ORAL_CAPSULE | Freq: Once | ORAL | Status: AC
  Administered 2018-08-24: 50 mg via ORAL
  Filled 2018-08-24: qty 2

## 2018-08-24 MED ORDER — SIMVASTATIN 20 MG PO TABS
20.0000 mg | ORAL_TABLET | Freq: Every day | ORAL | Status: DC
  Administered 2018-08-24 – 2018-08-26 (×3): 20 mg via ORAL
  Filled 2018-08-24 (×3): qty 1

## 2018-08-24 MED ORDER — ORAL CARE MOUTH RINSE
15.0000 mL | Freq: Two times a day (BID) | OROMUCOSAL | Status: DC
  Administered 2018-08-24 (×2): 15 mL via OROMUCOSAL

## 2018-08-24 MED ORDER — SODIUM CHLORIDE 0.9 % IV SOLN: 250.0000 mL | INTRAVENOUS | Status: DC | PRN

## 2018-08-24 MED ORDER — TRAZODONE HCL 50 MG PO TABS
25.0000 mg | ORAL_TABLET | Freq: Every evening | ORAL | Status: DC | PRN
  Administered 2018-08-25 – 2018-08-26 (×2): 50 mg via ORAL
  Filled 2018-08-24 (×2): qty 1

## 2018-08-24 MED ORDER — SODIUM CHLORIDE 0.9% FLUSH
3.0000 mL | Freq: Two times a day (BID) | INTRAVENOUS | Status: DC
  Administered 2018-08-24 – 2018-08-27 (×7): 3 mL via INTRAVENOUS

## 2018-08-24 MED ORDER — BUDESONIDE 0.5 MG/2ML IN SUSP
0.5000 mg | Freq: Two times a day (BID) | RESPIRATORY_TRACT | Status: DC
  Administered 2018-08-24 – 2018-08-27 (×5): 0.5 mg via RESPIRATORY_TRACT
  Filled 2018-08-24 (×6): qty 2

## 2018-08-24 MED ORDER — FAMOTIDINE 20 MG PO TABS
20.0000 mg | ORAL_TABLET | Freq: Two times a day (BID) | ORAL | Status: AC
  Administered 2018-08-24 – 2018-08-26 (×5): 20 mg via ORAL
  Filled 2018-08-24 (×5): qty 1

## 2018-08-24 MED ORDER — CLOPIDOGREL BISULFATE 75 MG PO TABS
75.0000 mg | ORAL_TABLET | Freq: Every day | ORAL | Status: DC
  Administered 2018-08-24 – 2018-08-27 (×4): 75 mg via ORAL
  Filled 2018-08-24 (×4): qty 1

## 2018-08-24 MED ORDER — ARFORMOTEROL TARTRATE 15 MCG/2ML IN NEBU: 15.0000 ug | INHALATION_SOLUTION | Freq: Two times a day (BID) | RESPIRATORY_TRACT | Status: DC

## 2018-08-24 MED ORDER — PREDNISONE 50 MG PO TABS
50.0000 mg | ORAL_TABLET | Freq: Four times a day (QID) | ORAL | Status: AC
  Administered 2018-08-24 – 2018-08-26 (×7): 50 mg via ORAL
  Filled 2018-08-24 (×7): qty 1

## 2018-08-24 MED ORDER — ACETAMINOPHEN 325 MG PO TABS
650.0000 mg | ORAL_TABLET | ORAL | Status: DC | PRN
  Administered 2018-08-27: 650 mg via ORAL
  Filled 2018-08-24: qty 2

## 2018-08-24 MED ORDER — IOPAMIDOL (ISOVUE-370) INJECTION 76%
100.0000 mL | Freq: Once | INTRAVENOUS | Status: AC | PRN
  Administered 2018-08-24: 100 mL via INTRAVENOUS

## 2018-08-24 MED ORDER — ONDANSETRON HCL 4 MG/2ML IJ SOLN: 4.0000 mg | Freq: Four times a day (QID) | INTRAMUSCULAR | Status: DC | PRN

## 2018-08-24 MED ORDER — ASPIRIN EC 81 MG PO TBEC: 81.0000 mg | DELAYED_RELEASE_TABLET | Freq: Every evening | ORAL | Status: DC

## 2018-08-24 MED ORDER — ARFORMOTEROL TARTRATE 15 MCG/2ML IN NEBU
15.0000 ug | INHALATION_SOLUTION | Freq: Two times a day (BID) | RESPIRATORY_TRACT | Status: DC
  Administered 2018-08-24 – 2018-08-27 (×5): 15 ug via RESPIRATORY_TRACT
  Filled 2018-08-24 (×6): qty 2

## 2018-08-24 MED ORDER — ESCITALOPRAM OXALATE 10 MG PO TABS
20.0000 mg | ORAL_TABLET | Freq: Every day | ORAL | Status: DC
  Administered 2018-08-24 – 2018-08-27 (×4): 20 mg via ORAL
  Filled 2018-08-24 (×4): qty 2

## 2018-08-24 MED ORDER — ALPRAZOLAM 0.25 MG PO TABS
0.2500 mg | ORAL_TABLET | Freq: Every day | ORAL | Status: DC
  Administered 2018-08-24 – 2018-08-26 (×3): 0.25 mg via ORAL
  Filled 2018-08-24 (×3): qty 1

## 2018-08-24 MED ORDER — DIPHENHYDRAMINE HCL 50 MG/ML IJ SOLN
50.0000 mg | Freq: Four times a day (QID) | INTRAMUSCULAR | Status: AC
  Administered 2018-08-24 – 2018-08-26 (×8): 50 mg via INTRAVENOUS
  Filled 2018-08-24 (×7): qty 1

## 2018-08-24 MED ORDER — SODIUM CHLORIDE 0.9% FLUSH: 3.0000 mL | INTRAVENOUS | Status: DC | PRN

## 2018-08-24 NOTE — Progress Notes (Signed)
Patient arrived, no complaints. Cardiac monitor initiated and verified.

## 2018-08-24 NOTE — H&P (Addendum)
Cardiology Admission History and Physical:   Patient ID: Jessica [name] MRN: [id number]; DOB: 05/04/1926   Admission date: 08/24/2018  Primary Care Provider: [name], [name], MD Primary Cardiologist: [name], MD   Chief Complaint:  Severe contrast dye allergy, here for pre TAVR CT scans  Patient Profile:   Jessica [name] is a 84 y.o. female with COPD, fibromyalgia, hyperlipidemia, severe aortic stenosis being worked up for TAVR and severe contrast dye allergy who was admitted from home today for planned pre TAVR CT scans.   History of Present Illness:   [name] has severe aortic stenosis. During her pre op work up for TAVR, she had a right and left heart cath on 07/07/18 which showed a severe, heavily calcified (80%) stenosis in the proximal LAD that required revascularization. The patient was discharged home but called in the next day with complaints of an intensely pruritic rash. Steroids were called into her pharmacy. She returned back to [facility] on 07/23/18 for coronary atherectomy with stent placement to mLAD. Given previous allergy she was pre-medicated with steroids and benadryl. Despite pre medication she still had a delayed contrast reaction greater than 12 hours after PCI resulting in pruritus and generalized erythema and associated hypotension w/ SBP into the 90s. Due to her severe reaction is was decided to premedicate her with prednisone 50mg , Benadryl 50mg  and Pepcid 20mg  at 13 hours, 7 hours and 1 hour prior to pre TAVR CT scans today at 4:30pm.    Past Medical History:  Diagnosis Date  . Anxiety   . congenital nystagmus   . COPD (chronic obstructive pulmonary disease) ([place])   . Coronary artery disease   . DJD (degenerative joint disease)   . Fibromyalgia   . Low back pain syndrome   . Memory loss   . Other and unspecified hyperlipidemia   . Severe aortic stenosis   . Venous insufficiency     Past Surgical History:  Procedure Laterality Date  . ABDOMINAL  HYSTERECTOMY    . ANTERIOR CERVICAL DISCECTOMY    . CARPAL TUNNEL RELEASE  12/2011   right arm  . CATARACT EXTRACTION    . CORONARY ATHERECTOMY  07/23/2018   PTCA/orbital atherectomy/DES x 1 proximal to mid LAD  . CORONARY ATHERECTOMY N/A 07/23/2018   Procedure: CORONARY ATHERECTOMY;  Surgeon: Burnell Blanks, MD;  Location: [place] [facility];  Service: Cardiovascular;  Laterality: N/A;  . CORONARY STENT INTERVENTION    . CORONARY STENT INTERVENTION N/A 07/23/2018   Procedure: CORONARY STENT INTERVENTION;  Surgeon: Burnell Blanks, MD;  Location: [place] [facility];  Service: Cardiovascular;  Laterality: N/A;  . LUMBAR LAMINECTOMY    . right knee arthroscopy    . right shoulder replacement  04/2009  . right shoulder surgery  2008   Dr. Noemi Chapel  . RIGHT/LEFT HEART CATH AND CORONARY ANGIOGRAPHY N/A 07/07/2018   Procedure: RIGHT/LEFT HEART CATH AND CORONARY ANGIOGRAPHY;  Surgeon: Burnell Blanks, MD;  Location: [place] [facility];  Service: Cardiovascular;  Laterality: N/A;  . ULTRASOUND GUIDANCE FOR VASCULAR ACCESS  07/07/2018   Procedure: Ultrasound Guidance For Vascular Access;  Surgeon: Burnell Blanks, MD;  Location: [place] [facility];  Service: Cardiovascular;;     Medications Prior to Admission: Prior to Admission medications   Medication Sig Start Date End Date Taking? Authorizing Provider  albuterol (PROVENTIL) (2.5 MG/3ML) 0.083% nebulizer solution Take 3 mLs (2.5 mg total) by nebulization every 4 (four) hours as needed for wheezing or shortness  of breath. DX: COPD J44.9 04/12/18   Flora Lipps, MD  albuterol (VENTOLIN HFA) 108 (90 Base) MCG/ACT inhaler Inhale 2 puffs into the lungs every 6 (six) hours as needed for wheezing or shortness of breath. 07/02/18   Flora Lipps, MD  ALPRAZolam (XANAX) 0.25 MG tablet TAKE 1/2 TO 1 TABLET BY MOUTH TWICE DAILY AS NEEDED. NO MORE THAN 45 TABS PER MONTH Patient taking differently: Take 0.25 mg by mouth at bedtime.  TAKE 1/2 TO 1 TABLET BY MOUTH TWICE DAILY AS NEEDED. NO MORE THAN 45 TABS PER MONTH 06/30/18   [name], [name], MD  arformoterol [facility]) 15 MCG/2ML NEBU Take 2 mLs (15 mcg total) by nebulization 2 (two) times daily. DX: COPD J44.9 07/27/17   Flora Lipps, MD  aspirin 81 MG tablet Take 81 mg by mouth every evening.     [provider]  budesonide (PULMICORT) 0.5 MG/2ML nebulizer solution Take 2 mLs (0.5 mg total) by nebulization 2 (two) times daily. DX: COPD J44.9 07/09/17   Flora Lipps, MD  Cholecalciferol (VITAMIN D) 1000 UNITS capsule Take 1,000 Units by mouth daily.      [provider]  clopidogrel (PLAVIX) 75 MG tablet Take 1 tablet (75 mg total) by mouth daily. 07/13/18 07/13/19  Burnell Blanks, MD  escitalopram (LEXAPRO) 20 MG tablet Take 1 tablet (20 mg total) by mouth daily. 06/30/18   [name], [name], MD  multivitamin-lutein [facility]) CAPS capsule Take 1 capsule by mouth daily.    [provider]  predniSONE (DELTASONE) 50 MG tablet Take one tablet by mouth at 3:30 AM and 9:30 AM on 08/24/2018 08/19/18   Burnell Blanks, MD  simvastatin (ZOCOR) 20 MG tablet Take 1 tablet (20 mg total) by mouth at bedtime. 12/23/17   [name], MD  traZODone (DESYREL) 50 MG tablet Take 0.5-1 tablets (25-50 mg total) by mouth at bedtime as needed for sleep. 06/30/18   [name], [name], MD     Allergies:    Allergies  Allergen Reactions  . Contrast Media [Iodinated Diagnostic Agents] Itching, Rash and Other (See Comments)    Hypotension, Skin turns red like a sunburn  . Iohexol Itching, Rash and Other (See Comments)    Hypotension, Skin turns red like a sunburn    Social History:   Social History   Socioeconomic History  . Marital status: Widowed    Spouse name: Not on file  . Number of children: 2  . Years of education: Not on file  . Highest education level: Not on file  Occupational History  . Occupation: [employer]    Social Needs  . Financial resource strain: Not on file  . Food insecurity:    Worry: Not on file    Inability: Not on file  . Transportation needs:    Medical: Not on file    Non-medical: Not on file  Tobacco Use  . Smoking status: Former Smoker    Packs/day: 2.00    Years: 30.00    Pack years: 60.00    Types: Cigarettes    Last attempt to quit: 10/28/1991    Years since quitting: 26.8  . Smokeless tobacco: Never Used  Substance and Sexual Activity  . Alcohol use: Yes    Comment: 1 drink per night  . Drug use: No  . Sexual activity: Not on file  Lifestyle  . Physical activity:    Days per week: Not on file    Minutes per session: Not on  file  . Stress: Not on file  Relationships  . Social connections:    Talks on phone: Not on file    Gets together: Not on file    Attends religious service: Not on file    Active member of club or organization: Not on file    Attends meetings of clubs or organizations: Not on file    Relationship status: Not on file  . Intimate partner violence:    Fear of current or ex partner: Not on file    Emotionally abused: Not on file    Physically abused: Not on file    Forced sexual activity: Not on file  Other Topics Concern  . Not on file  Social History Narrative   1 sibling alive age 46   1 sibling alive age 67   1 sibling deceased age 17   1 sibling alive age 23    Family History:   The patient's family history includes Other in her father and mother.    ROS:  Please see the history of present illness.  All other ROS reviewed and negative.     Physical Exam/Data:  There were no vitals filed for this visit. No intake or output data in the 24 hours ending 08/24/18 1412 There were no vitals filed for this visit. There is no height or weight on file to calculate BMI.  General:  Well nourished, well developed, in no acute distress, elderly white female HEENT: normal Lymph: no adenopathy Neck: no JVD Endocrine:  No  thryomegaly Vascular: No carotid bruits; FA pulses 2+ bilaterally without bruits  Cardiac:  normal S1, S2; RRR; harsh systolic ejection murmur Lungs:  clear to auscultation bilaterally, no wheezing, rhonchi or rales  Abd: soft, nontender, no hepatomegaly  Ext: no edema Musculoskeletal:  No deformities, BUE and BLE strength normal and equal Skin: warm and dry  Neuro:  CNs 2-12 intact, no focal abnormalities noted Psych:  Normal affect    Relevant CV Studies: Echo 06/11/18 Study Conclusions - Left ventricle: The cavity size was normal. There was moderate   concentric hypertrophy. Systolic function was normal. The   estimated ejection fraction was in the range of 55% to 60%. Wall   motion was normal; there were no regional wall motion   abnormalities. Doppler parameters are consistent with abnormal   left ventricular relaxation (grade 1 diastolic dysfunction). - Aortic valve: There was severe stenosis. Mean gradient (S): 49 mm   Hg. VTI ratio of LVOT to aortic valve: 0.15. Valve area (VTI):   0.47 cm^2. - Mitral valve: Calcified annulus. - Pulmonary arteries: Systolic pressure was mildly to moderately   increased. PA peak pressure: 45 mm Hg (S). Impressions: - Progression of severe aortic stenosis.   Laboratory Data:  ChemistryNo results for input(s): NA, K, CL, CO2, GLUCOSE, BUN, CREATININE, CALCIUM, GFRNONAA, GFRAA, ANIONGAP in the last 168 hours.  No results for input(s): PROT, ALBUMIN, AST, ALT, ALKPHOS, BILITOT in the last 168 hours. HematologyNo results for input(s): WBC, RBC, HGB, HCT, MCV, MCH, MCHC, RDW, PLT in the last 168 hours. Cardiac EnzymesNo results for input(s): TROPONINI in the last 168 hours. No results for input(s): TROPIPOC in the last 168 hours.  BNPNo results for input(s): BNP, PROBNP in the last 168 hours.  DDimer No results for input(s): DDIMER in the last 168 hours.  Radiology/Studies:  No results found.  Assessment and Plan:   Severe aortic  stenosis: she will have CT scans today at 4:30pm as a part  of her pre TAVR work up. Given her severe contrast dye allergy with hypotension and pruritic rash, we have elected to admit her for overnight observation. She will be due for a dose of solumedrol 125mg , Benadryl 50mg  and Pepcid 20mg  at 3:30pm ( 1 hour before scans). We will continue the prednisone and benadryl every 6 hours and Pepcid every 12 hours for a total of 24 hours after scans.   While she is here I will order a pre TAVR PT evaluation.   Severity of Illness: The appropriate patient status for this patient is OBSERVATION. Observation status is judged to be reasonable and necessary in order to provide the required intensity of service to ensure the patient's safety. The patient's presenting symptoms, physical exam findings, and initial radiographic and laboratory data in the context of their medical condition is felt to place them at decreased risk for further clinical deterioration. Furthermore, it is anticipated that the patient will be medically stable for discharge from the hospital within 2 midnights of admission. The following factors support the patient status of observation.   " The patient's presenting symptoms include shortness of breath " The physical exam findings include elderly female. " The initial radiographic and laboratory data are not completed at this time.     For questions or updates, please contact [place] Please consult [email or web address] for contact info under    Signed, [name], PA-C  08/24/2018 2:12 PM   I have personally seen and examined this patient with [name], PA-C. I agree with the assessment and plan as outlined above. She has severe aortic valve stenosis. She is being admitted for administration Prednisone/Benadryl/Pepcid before and after her CT scans that are part of her TAVR workup. She has had severe contrast reaction/rash after both of her cardiac catheterizations despite  using pre-medication for contrast allergy. Her scans will be today and then she will be monitored with administration of medications as above until tomorrow afternoon.  She has c/o ongoing dyspnea with exertion.  My exam shows a thin elderly female in NAD. ES:LPNP systolic murmur noted. Lungs are clear. No LE edema.   Lauree Chandler 08/24/2018 3:01 PM

## 2018-08-24 NOTE — Progress Notes (Signed)
PT Cancellation Note  Patient Details Name: Jessica [name] MRN: [id number] DOB: Dec 22, 1925   Cancelled Treatment:    Reason Eval/Treat Not Completed: Patient at procedure or test/unavailable (to CT for pre-TAVR scans). Will follow-up for PT evaluation.  Mabeline Caras, PT, DPT Acute Rehabilitation Services  Pager [phone] Office [place] 08/24/2018, 4:42 PM

## 2018-08-24 NOTE — Plan of Care (Signed)
  Problem: Education: Goal: Knowledge of General Education information will improve Description Including pain rating scale, medication(s)/side effects and non-pharmacologic comfort measures Outcome: Progressing   Problem: Health Behavior/Discharge Planning: Goal: Ability to manage health-related needs will improve Outcome: Progressing   

## 2018-08-25 ENCOUNTER — Observation Stay (HOSPITAL_BASED_OUTPATIENT_CLINIC_OR_DEPARTMENT_OTHER): Payer: Medicare Other

## 2018-08-25 DIAGNOSIS — Z86711 Personal history of pulmonary embolism: Secondary | ICD-10-CM

## 2018-08-25 DIAGNOSIS — I350 Nonrheumatic aortic (valve) stenosis: Secondary | ICD-10-CM | POA: Diagnosis not present

## 2018-08-25 DIAGNOSIS — I2699 Other pulmonary embolism without acute cor pulmonale: Secondary | ICD-10-CM

## 2018-08-25 LAB — COMPREHENSIVE METABOLIC PANEL
ALBUMIN: 3.2 g/dL — AB (ref 3.5–5.0)
ALT: 11 U/L (ref 0–44)
ANION GAP: 8 (ref 5–15)
AST: 20 U/L (ref 15–41)
Alkaline Phosphatase: 106 U/L (ref 38–126)
BUN: 16 mg/dL (ref 8–23)
CHLORIDE: 104 mmol/L (ref 98–111)
CO2: 26 mmol/L (ref 22–32)
Calcium: 9.3 mg/dL (ref 8.9–10.3)
Creatinine, Ser: 0.97 mg/dL (ref 0.44–1.00)
GFR calc non Af Amer: 50 mL/min — ABNORMAL LOW (ref >60)
GFR, EST AFRICAN AMERICAN: 58 mL/min — AB (ref >60)
GLUCOSE: 135 mg/dL — AB (ref 70–99)
Potassium: 3.9 mmol/L (ref 3.5–5.1)
SODIUM: 138 mmol/L (ref 135–145)
Total Bilirubin: 0.5 mg/dL (ref 0.3–1.2)
Total Protein: 6.4 g/dL — ABNORMAL LOW (ref 6.5–8.1)

## 2018-08-25 MED ORDER — APIXABAN 5 MG PO TABS
10.0000 mg | ORAL_TABLET | Freq: Two times a day (BID) | ORAL | Status: DC
  Administered 2018-08-25 – 2018-08-27 (×5): 10 mg via ORAL
  Filled 2018-08-25 (×5): qty 2

## 2018-08-25 MED ORDER — APIXABAN 5 MG PO TABS: 5.0000 mg | ORAL_TABLET | Freq: Two times a day (BID) | ORAL | Status: DC

## 2018-08-25 NOTE — Progress Notes (Signed)
*  Preliminary Results* Bilateral lower extremity venous duplex completed.   Right: Findings consistent with acute deep vein thrombosis involving the right femoral vein, right popliteal vein, right posterior tibial vein, and right peroneal vein. No cystic structure found in the popliteal fossa. Pulsatile CFV suggestive of elevated right sided heart pressure  Left: There is no evidence of deep vein thrombosis in the lower extremity. No cystic structure found in the popliteal fossa.    Abram Sander 08/25/2018 1:03 PM

## 2018-08-25 NOTE — Progress Notes (Signed)
Progress Note  Patient Name: Jessica [name] Date of Encounter: 08/25/2018  Primary Cardiologist: [name], MD   Subjective   C/o dyspnea. No rash. No chest pain  Inpatient Medications    Scheduled Meds: . ALPRAZolam  0.25 mg Oral QHS  . arformoterol  15 mcg Nebulization BID  . budesonide  0.5 mg Nebulization BID  . clopidogrel  75 mg Oral Daily  . diphenhydrAMINE  50 mg Intravenous Q6H  . escitalopram  20 mg Oral Daily  . famotidine  20 mg Oral Q12H  . mouth rinse  15 mL Mouth Rinse BID  . predniSONE  50 mg Oral Q6H  . simvastatin  20 mg Oral QHS  . sodium chloride flush  3 mL Intravenous Q12H   Continuous Infusions: . sodium chloride     PRN Meds: sodium chloride, acetaminophen, ondansetron (ZOFRAN) IV, sodium chloride flush, traZODone   Vital Signs    Vitals:   08/25/18 0033 08/25/18 0316 08/25/18 0831 08/25/18 0843  BP: 127/62 134/71  124/63  Pulse: 78 77  65  Resp: 16 18  20   Temp: 97.8 F (36.6 C) (!) 97.5 F (36.4 C)    TempSrc: Oral Oral    SpO2: 95% 97% 99% 96%  Weight:  58.3 kg    Height:        Intake/Output Summary (Last 24 hours) at 08/25/2018 1056 Last data filed at 08/25/2018 0912 Gross per 24 hour  Intake 702 ml  Output 500 ml  Net 202 ml   Filed Weights   08/24/18 1412 08/25/18 0316  Weight: 57.9 kg 58.3 kg    Telemetry    sinus - Personally Reviewed  ECG    N/A Personally Reviewed  Physical Exam   GEN: No acute distress.   Neck: No JVD Cardiac: RRR, loud harsh systolic murmur.   Respiratory: Clear to auscultation bilaterally. GI: Soft, nontender, non-distended  MS: No edema; No deformity. Neuro:  Nonfocal  Psych: Normal affect   Labs    Chemistry Recent Labs  Lab 08/24/18 1440 08/25/18 0540  NA 139 138  K 4.3 3.9  CL 107 104  CO2 23 26  GLUCOSE 149* 135*  BUN 10 16  CREATININE 0.91 0.97  CALCIUM 9.4 9.3  PROT  --  6.4*  ALBUMIN  --  3.2*  AST  --  20  ALT  --  11  ALKPHOS  --  106  BILITOT   --  0.5  GFRNONAA 54* 50*  GFRAA >60 58*  ANIONGAP 9 8     HematologyNo results for input(s): WBC, RBC, HGB, HCT, MCV, MCH, MCHC, RDW, PLT in the last 168 hours.  Cardiac EnzymesNo results for input(s): TROPONINI in the last 168 hours. No results for input(s): TROPIPOC in the last 168 hours.   BNPNo results for input(s): BNP, PROBNP in the last 168 hours.   DDimer No results for input(s): DDIMER in the last 168 hours.   Radiology    Ct Coronary Morph W/cta Cor W/score W/ca W/cm &/or Wo/cm  Addendum Date: 08/25/2018   ADDENDUM REPORT: 08/25/2018 10:12 EXAM: OVER-READ INTERPRETATION  CT CHEST The following report is an over-read performed by radiologist Dr. Samara Snide [facility] on 08/25/2018. This over-read does not include interpretation of cardiac or coronary anatomy or pathology. The coronary CTA interpretation by the cardiologist is attached. COMPARISON:  12/28/2014 chest CT. FINDINGS: Please see the separate concurrent chest CT angiogram report for details. IMPRESSION: Please see the separate concurrent chest  CT angiogram report for details. Electronically Signed   By: Ilona Sorrel M.D.   On: 08/25/2018 10:12   Result Date: 08/25/2018 CLINICAL DATA:  Aortic Stenosis EXAM: Cardiac TAVR CT TECHNIQUE: The patient was scanned on a Siemens Force 272 slice scanner. A 120 kV retrospective scan was triggered in the ascending thoracic aorta at 140 HU's. Gantry rotation speed was 250 msecs and collimation was .6 mm. No beta blockade or nitro were given. The 3D data set was reconstructed in 5% intervals of the R-R cycle. Systolic and diastolic phases were analyzed on a dedicated work station using MPR, MIP and VRT modes. The patient received 80 cc of contrast. FINDINGS: Aortic Valve: Tri leaflet and calcified with restricted leaflet motion Aorta: Normal arch vessels severe calcific atherosclerosis Sino-tubular Junction: 29 mm Ascending Thoracic Aorta: 35 mm Aortic Arch: 27 mm  Descending Thoracic Aorta: 23 mm Sinus of Valsalva Measurements: Non-coronary: 34.5 mm Right - coronary: 33 mm Left -   coronary: 32.5 mm Coronary Artery Height above Annulus: Left Main: 16.7 mm above annulus Right Coronary: 15.5 mm above annulus Virtual Basal Annulus Measurements: Maximum / Minimum Diameter: 26.3 mm x 21.7 mm Perimeter: 76 mm Area: 436 mm2 Coronary Arteries: Sufficient height above annulus for deployment Optimum Fluoroscopic Angle for Delivery: LAO 14 Caudal 19 degrees IMPRESSION: 1. Tri leaflet AV with annular area of 436 mm2 suitable for a 26 mm Sapien 3 valve 2.  Normal aortic root 3.5 cm 3. Optimum angiographic angle for deployment LAO 14 Caudal 19 degrees 4.  Coronary arteries sufficient height above annulus for deployment 5.  No LAA thrombus Jenkins Rouge Electronically Signed: By: Jenkins Rouge M.D. On: 08/24/2018 18:12   Ct Angio Chest Aorta W &/or Wo Contrast  Result Date: 08/25/2018 CLINICAL DATA:  Severe symptomatic aortic stenosis with dyspnea on exertion. Pre-TAVR evaluation. EXAM: CT ANGIOGRAPHY CHEST, ABDOMEN AND PELVIS TECHNIQUE: Multidetector CT imaging through the chest, abdomen and pelvis was performed using the standard protocol during bolus administration of intravenous contrast. Multiplanar reconstructed images and MIPs were obtained and reviewed to evaluate the vascular anatomy. CONTRAST:  167mL ISOVUE-370 IOPAMIDOL (ISOVUE-370) INJECTION 76% COMPARISON:  12/28/2014 chest CT.  06/12/2010 CT abdomen/pelvis. FINDINGS: CTA CHEST FINDINGS Cardiovascular: Normal heart size. No significant pericardial effusion/thickening. Left anterior descending and left circumflex coronary atherosclerosis. Severe thickening and calcification of the aortic valve. Atherosclerotic nonaneurysmal thoracic aorta. Normal caliber pulmonary arteries. Incidentally noted are acute pulmonary emboli within lobar and segmental branches of the right upper and right middle lobes. Mediastinum/Nodes: No  discrete thyroid nodules. Unremarkable esophagus. No pathologically enlarged axillary, mediastinal or hilar lymph nodes. Lungs/Pleura: No pneumothorax. No pleural effusion. Mild centrilobular emphysema with diffuse bronchial wall thickening. Posterior apical right upper lobe 8 x 6 mm solid pulmonary nodule (series 5/image 13) appears slightly increased from 8 x 4 mm on 12/28/2014 chest CT. Basilar left lower lobe 6 mm solid pulmonary nodule (series 5/image 89) is stable and considered benign. No acute consolidative airspace disease, lung masses or new significant pulmonary nodules. Musculoskeletal: No aggressive appearing focal osseous lesions. Partially visualized right shoulder arthroplasty. Moderate thoracic spondylosis. CTA ABDOMEN AND PELVIS FINDINGS Hepatobiliary: Normal liver with no liver mass. Normal gallbladder with no radiopaque cholelithiasis. No intrahepatic biliary ductal dilatation. CBD diameter 6-7 mm, within normal limits for age. Pancreas: Cystic 2.3 x 1.5 cm pancreatic head lesion (series 4/image 116), increased from 1.2 x 0.7 cm on 06/12/2010 CT. No additional pancreatic lesions. No pancreatic duct dilation. Spleen: Normal size. No mass. Adrenals/Urinary  Tract: Normal adrenals. Simple 1.8 cm interpolar left renal cyst. Subcentimeter hypodense renal cortical lesion in the lower left kidney is too small to characterize and requires no follow-up. Multiple parapelvic renal cysts in both kidneys. No hydronephrosis. No additional contour deforming renal lesions. Normal bladder. Stomach/Bowel: Normal non-distended stomach. Normal caliber small bowel with no small bowel wall thickening. Appendix not discretely visualized. No pericecal inflammatory changes. Marked sigmoid diverticulosis, with no large bowel wall thickening or significant pericolonic fat stranding. Vascular/Lymphatic: Atherosclerotic nonaneurysmal abdominal aorta. Patent renal and splenic veins. No pathologically enlarged lymph nodes in  the abdomen or pelvis. Reproductive: Status post hysterectomy, with no abnormal findings at the vaginal cuff. No adnexal mass. Other: No pneumoperitoneum, ascites or focal fluid collection. Musculoskeletal: No aggressive appearing focal osseous lesions. Marked lumbar spondylosis. VASCULAR MEASUREMENTS PERTINENT TO TAVR: AORTA: Minimal Aortic Diameter-12.4 x 11.9 mm (infrarenal abdominal aorta on series 4/image 122) Severity of Aortic Calcification-moderate RIGHT PELVIS: Right Common Iliac Artery - Minimal Diameter-8.9 x 7.8 mm Tortuosity-mild Calcification-moderate Right External Iliac Artery - Minimal Diameter-7.7 x 7.1 mm Tortuosity-moderate Calcification-none Right Common Femoral Artery - Minimal Diameter-7.3 x 6.7 mm Tortuosity-moderate Calcification-mild LEFT PELVIS: Left Common Iliac Artery - Minimal Diameter-9.7 x 8.9 mm Tortuosity-mild Calcification-moderate Left External Iliac Artery - Minimal Diameter-7.1 x 6.9 mm Tortuosity-moderate Calcification-mild Left Common Femoral Artery - Minimal Diameter-7.7 x 7.1 mm Tortuosity-mild Calcification-mild Review of the MIP images confirms the above findings. IMPRESSION: 1. Incidental acute pulmonary emboli within the lobar and segmental branches of the right upper and right middle lobes. 2. Vascular findings and measurements pertinent to potential TAVR procedure, as detailed above. 3. Severe thickening and calcification of the aortic valve, compatible with the reported clinical history of severe aortic stenosis. 4. Aortic Atherosclerosis (ICD10-I70.0) and Emphysema (ICD10-J43.9). 5. Two-vessel coronary atherosclerosis. 6. Posterior apical right upper lobe 8 x 6 mm solid pulmonary nodule appears slightly increased in size since 2016 chest CT, potentially artifactual due to technical scan differences. Recommend attention on follow-up chest CT in 3-6 months. 7. Cystic 2.3 cm pancreatic head lesion is increased in size since 2011 CT abdomen study. No biliary or  pancreatic duct dilation or other high risk features on this routine CT study. MRI abdomen without and with IV contrast is indicated for further characterization, and may be performed as clinically warranted. Alternatively, follow-up pancreas protocol CT abdomen without and with IV contrast may be obtained in 6-12 months. 8. Marked sigmoid diverticulosis. Critical Value/emergent results were called by telephone at the time of interpretation on 08/25/2018 at 10:11 am to Dr. Lauree Chandler , who verbally acknowledged these results. Electronically Signed   By: Ilona Sorrel M.D.   On: 08/25/2018 10:14   Ct Angio Abd/pel W/ And/or W/o  Result Date: 08/25/2018 CLINICAL DATA:  Severe symptomatic aortic stenosis with dyspnea on exertion. Pre-TAVR evaluation. EXAM: CT ANGIOGRAPHY CHEST, ABDOMEN AND PELVIS TECHNIQUE: Multidetector CT imaging through the chest, abdomen and pelvis was performed using the standard protocol during bolus administration of intravenous contrast. Multiplanar reconstructed images and MIPs were obtained and reviewed to evaluate the vascular anatomy. CONTRAST:  157mL ISOVUE-370 IOPAMIDOL (ISOVUE-370) INJECTION 76% COMPARISON:  12/28/2014 chest CT.  06/12/2010 CT abdomen/pelvis. FINDINGS: CTA CHEST FINDINGS Cardiovascular: Normal heart size. No significant pericardial effusion/thickening. Left anterior descending and left circumflex coronary atherosclerosis. Severe thickening and calcification of the aortic valve. Atherosclerotic nonaneurysmal thoracic aorta. Normal caliber pulmonary arteries. Incidentally noted are acute pulmonary emboli within lobar and segmental branches of the right upper and right middle lobes. Mediastinum/Nodes:  No discrete thyroid nodules. Unremarkable esophagus. No pathologically enlarged axillary, mediastinal or hilar lymph nodes. Lungs/Pleura: No pneumothorax. No pleural effusion. Mild centrilobular emphysema with diffuse bronchial wall thickening. Posterior apical  right upper lobe 8 x 6 mm solid pulmonary nodule (series 5/image 13) appears slightly increased from 8 x 4 mm on 12/28/2014 chest CT. Basilar left lower lobe 6 mm solid pulmonary nodule (series 5/image 89) is stable and considered benign. No acute consolidative airspace disease, lung masses or new significant pulmonary nodules. Musculoskeletal: No aggressive appearing focal osseous lesions. Partially visualized right shoulder arthroplasty. Moderate thoracic spondylosis. CTA ABDOMEN AND PELVIS FINDINGS Hepatobiliary: Normal liver with no liver mass. Normal gallbladder with no radiopaque cholelithiasis. No intrahepatic biliary ductal dilatation. CBD diameter 6-7 mm, within normal limits for age. Pancreas: Cystic 2.3 x 1.5 cm pancreatic head lesion (series 4/image 116), increased from 1.2 x 0.7 cm on 06/12/2010 CT. No additional pancreatic lesions. No pancreatic duct dilation. Spleen: Normal size. No mass. Adrenals/Urinary Tract: Normal adrenals. Simple 1.8 cm interpolar left renal cyst. Subcentimeter hypodense renal cortical lesion in the lower left kidney is too small to characterize and requires no follow-up. Multiple parapelvic renal cysts in both kidneys. No hydronephrosis. No additional contour deforming renal lesions. Normal bladder. Stomach/Bowel: Normal non-distended stomach. Normal caliber small bowel with no small bowel wall thickening. Appendix not discretely visualized. No pericecal inflammatory changes. Marked sigmoid diverticulosis, with no large bowel wall thickening or significant pericolonic fat stranding. Vascular/Lymphatic: Atherosclerotic nonaneurysmal abdominal aorta. Patent renal and splenic veins. No pathologically enlarged lymph nodes in the abdomen or pelvis. Reproductive: Status post hysterectomy, with no abnormal findings at the vaginal cuff. No adnexal mass. Other: No pneumoperitoneum, ascites or focal fluid collection. Musculoskeletal: No aggressive appearing focal osseous lesions. Marked  lumbar spondylosis. VASCULAR MEASUREMENTS PERTINENT TO TAVR: AORTA: Minimal Aortic Diameter-12.4 x 11.9 mm (infrarenal abdominal aorta on series 4/image 122) Severity of Aortic Calcification-moderate RIGHT PELVIS: Right Common Iliac Artery - Minimal Diameter-8.9 x 7.8 mm Tortuosity-mild Calcification-moderate Right External Iliac Artery - Minimal Diameter-7.7 x 7.1 mm Tortuosity-moderate Calcification-none Right Common Femoral Artery - Minimal Diameter-7.3 x 6.7 mm Tortuosity-moderate Calcification-mild LEFT PELVIS: Left Common Iliac Artery - Minimal Diameter-9.7 x 8.9 mm Tortuosity-mild Calcification-moderate Left External Iliac Artery - Minimal Diameter-7.1 x 6.9 mm Tortuosity-moderate Calcification-mild Left Common Femoral Artery - Minimal Diameter-7.7 x 7.1 mm Tortuosity-mild Calcification-mild Review of the MIP images confirms the above findings. IMPRESSION: 1. Incidental acute pulmonary emboli within the lobar and segmental branches of the right upper and right middle lobes. 2. Vascular findings and measurements pertinent to potential TAVR procedure, as detailed above. 3. Severe thickening and calcification of the aortic valve, compatible with the reported clinical history of severe aortic stenosis. 4. Aortic Atherosclerosis (ICD10-I70.0) and Emphysema (ICD10-J43.9). 5. Two-vessel coronary atherosclerosis. 6. Posterior apical right upper lobe 8 x 6 mm solid pulmonary nodule appears slightly increased in size since 2016 chest CT, potentially artifactual due to technical scan differences. Recommend attention on follow-up chest CT in 3-6 months. 7. Cystic 2.3 cm pancreatic head lesion is increased in size since 2011 CT abdomen study. No biliary or pancreatic duct dilation or other high risk features on this routine CT study. MRI abdomen without and with IV contrast is indicated for further characterization, and may be performed as clinically warranted. Alternatively, follow-up pancreas protocol CT abdomen without  and with IV contrast may be obtained in 6-12 months. 8. Marked sigmoid diverticulosis. Critical Value/emergent results were called by telephone at the time of interpretation on 08/25/2018  at 10:11 am to Dr. Lauree Chandler , who verbally acknowledged these results. Electronically Signed   By: Ilona Sorrel M.D.   On: 08/25/2018 10:14    Cardiac Studies     Patient Profile     84 y.o. female with COPD, CAD, HLD and severe aortic stenosis who is undergoing workup for TAVR and was admitted on 08/24/18 for her pre-tAVR CT scans. She had a bad reaction post cath from the contrast dye so we admitted her for the scans to do pre and post treatment for contrast allergy.   Assessment & Plan    1. Severe aortic stenosis: Workup for TAVR underway. Pre-TAVR CT scans yesterday. Treating with prednisone, benadryl and pepcid today post scans given severe reaction to the contrast dye post cath.   2. Acute right lung pulmonary embolism: Incidental finding today on her pre-TAVR CT chest. The PE is within the lobar and segmental branches of the right upper and middle lobes. I will start Eliquis 5 mg po BID today (full dose given acute PE per pharmacy team). Will stop ASA. She is also on Plavix with recent coronary stent placement Discussed with her daughter on the phone this am. Will arrange lower ext venous dopplers.   3. CAD: Stable. No chest pain. Recent coronary stent placement. Will stop ASA since we are starting Eliquis today. Continue Plavix.   For questions or updates, please contact [place] Please consult [email or web address] for contact info under        Signed, Lauree Chandler, MD  08/25/2018, 10:56 AM

## 2018-08-25 NOTE — Progress Notes (Signed)
ANTICOAGULATION CONSULT NOTE - Initial Consult  Pharmacy Consult for apixaban Indication: pulmonary embolus  Allergies  Allergen Reactions  . Contrast Media [Iodinated Diagnostic Agents] Itching, Rash and Other (See Comments)    Hypotension, Skin turns red like a sunburn  . Iohexol Itching, Rash and Other (See Comments)    Hypotension, Skin turns red like a sunburn    Patient Measurements: Height: 5\' 5"  (165.1 cm) Weight: 128 lb 9.6 oz (58.3 kg) IBW/kg (Calculated) : 57 Heparin Dosing Weight:   Vital Signs: Temp: 97.5 F (36.4 C) (10/30 0316) Temp Source: Oral (10/30 0316) BP: 124/63 (10/30 0843) Pulse Rate: 65 (10/30 0843)  Labs: Recent Labs    08/24/18 1440 08/25/18 0540  CREATININE 0.91 0.97    Estimated Creatinine Clearance: 34.7 mL/min (by C-G formula based on SCr of 0.97 mg/dL).   Medical History: Past Medical History:  Diagnosis Date  . Anxiety   . congenital nystagmus   . COPD (chronic obstructive pulmonary disease) ([place])   . Coronary artery disease   . DJD (degenerative joint disease)   . Fibromyalgia   . Low back pain syndrome   . Memory loss   . Other and unspecified hyperlipidemia   . Severe aortic stenosis   . Venous insufficiency     Medications:  Medications Prior to Admission  Medication Sig Dispense Refill Last Dose  . albuterol (PROVENTIL) (2.5 MG/3ML) 0.083% nebulizer solution Take 3 mLs (2.5 mg total) by nebulization every 4 (four) hours as needed for wheezing or shortness of breath. DX: COPD J44.9 75 mL 12 08/24/2018 at prn  . albuterol (VENTOLIN HFA) 108 (90 Base) MCG/ACT inhaler Inhale 2 puffs into the lungs every 6 (six) hours as needed for wheezing or shortness of breath. 18 Inhaler 2 08/23/2018 at prn  . ALPRAZolam (XANAX) 0.25 MG tablet TAKE 1/2 TO 1 TABLET BY MOUTH TWICE DAILY AS NEEDED. NO MORE THAN 45 TABS PER MONTH (Patient taking differently: Take 0.25 mg by mouth at bedtime. TAKE 1/2 TO 1 TABLET BY MOUTH TWICE DAILY AS NEEDED.  NO MORE THAN 45 TABS PER MONTH) 45 tablet 3 08/23/2018 at prn  . arformoterol (BROVANA) 15 MCG/2ML NEBU Take 2 mLs (15 mcg total) by nebulization 2 (two) times daily. DX: COPD J44.9 (Patient taking differently: Take 15 mcg by nebulization 2 (two) times daily. ) 120 mL 12 08/10/2018 at prn  . aspirin 81 MG tablet Take 81 mg by mouth every evening.    08/23/2018 at Unknown time  . budesonide (PULMICORT) 0.5 MG/2ML nebulizer solution Take 2 mLs (0.5 mg total) by nebulization 2 (two) times daily. DX: COPD J44.9 120 mL 12 Past Week at prn  . Cholecalciferol (VITAMIN D) 1000 UNITS capsule Take 1,000 Units by mouth daily.     08/23/2018 at Unknown time  . clopidogrel (PLAVIX) 75 MG tablet Take 1 tablet (75 mg total) by mouth daily. 30 tablet 11 08/23/2018 at Unknown time  . Cyanocobalamin (VITAMIN B-12 PO) Take 1 tablet by mouth daily.   08/23/2018 at Unknown time  . escitalopram (LEXAPRO) 20 MG tablet Take 1 tablet (20 mg total) by mouth daily. 90 tablet 1 08/23/2018 at Unknown time  . multivitamin-lutein (OCUVITE-LUTEIN) CAPS capsule Take 1 capsule by mouth daily.   08/23/2018 at Unknown time  . predniSONE (DELTASONE) 50 MG tablet Take one tablet by mouth at 3:30 AM and 9:30 AM on 08/24/2018 (Patient taking differently: Take 50 mg by mouth daily with breakfast. Take one tablet by mouth at 3:30 AM and  9:30 AM on 08/24/2018) 2 tablet 0 08/23/2018 at Unknown time  . simvastatin (ZOCOR) 20 MG tablet Take 1 tablet (20 mg total) by mouth at bedtime. 90 tablet 3 08/23/2018 at Unknown time  . traZODone (DESYREL) 50 MG tablet Take 0.5-1 tablets (25-50 mg total) by mouth at bedtime as needed for sleep. 90 tablet 0 08/23/2018 at prn  . amoxicillin (AMOXIL) 250 MG capsule Take 250 mg by mouth 3 (three) times daily.  0 08/22/2018   Scheduled:  . ALPRAZolam  0.25 mg Oral QHS  . apixaban  10 mg Oral BID   Followed by  . [START ON 09/01/2018] apixaban  5 mg Oral BID  . arformoterol  15 mcg Nebulization BID  .  budesonide  0.5 mg Nebulization BID  . clopidogrel  75 mg Oral Daily  . diphenhydrAMINE  50 mg Intravenous Q6H  . escitalopram  20 mg Oral Daily  . famotidine  20 mg Oral Q12H  . mouth rinse  15 mL Mouth Rinse BID  . predniSONE  50 mg Oral Q6H  . simvastatin  20 mg Oral QHS  . sodium chloride flush  3 mL Intravenous Q12H   Infusions:  . sodium chloride      Assessment: Jessica [name] was admitted for dyspnea and is undergoing work up for TAVR. She was found to have a PE. Due to the indication for PE, we will start the full dose load and maintenance rather than reduce dose.   Goal of Therapy:   Monitor platelets by anticoagulation protocol: Yes   Plan:  Eliquis 10mg  PO BID x7d then 5mg  BID  F/u for bleeding  Onnie Boer, PharmD, BCIDP, AAHIVP, CPP Infectious Disease Pharmacist Pager: [phone] 08/25/2018 11:10 AM

## 2018-08-25 NOTE — Progress Notes (Signed)
Physical Therapy Treatment Patient Details Name: Jessica [name] MRN: [id number] DOB: 1925-12-23 Today's Date: 08/25/2018    History of Present Illness Pt is a 84 y.o. female admitted 08/24/18 for pre-TAVR workup. Incidental findings of right-sided PE on chest imaging; eliquis initiated 12:15pm on 10/30); per cardiology, to push back TAVR procedure secondary to this. BLE venous duplex 10/30 shows RLE DVT. PMH includes COPD, CAD, DHD, fibromyalgia, memory loss.   PT Comments    Pt seen for second time, yelling for assistance needing to use the bathroom. Pt requires min-modA for mobility; ambulatory with RW, but requires minA to correct intermittent LOB with static and dynamic standing tasks. Spoke with daughter on phone who reports pt requires supervision and intermittent assist at home; family will be able to provide necessary level of assist upon return home. Pt educ on use of call bell, although remains disoriented and pleasantly confused. Will continue to follow acutely.   Follow Up Recommendations  Home health PT;Supervision/Assistance - 24 hour(daughter verified able to continue providing 24/7 assist)     Equipment Recommendations  None recommended by PT    Recommendations for Other Services       Precautions / Restrictions Precautions Precautions: Fall Precaution Comments: 2L O2 [place] baseline Restrictions Weight Bearing Restrictions: No    Mobility  Bed Mobility Overal bed mobility: Needs Assistance Bed Mobility: Supine to Sit;Sit to Supine     Supine to sit: Modified independent (Device/Increase time) Sit to supine: Modified independent (Device/Increase time)   General bed mobility comments: HOB slightly elevated. Upon returning to sit on bed, pt able to easily roll towards other side in order to get up to chair  Transfers Overall transfer level: Needs assistance Equipment used: Rolling walker (2 wheeled);1 person hand held assist Transfers: Sit to/from Stand Sit to  Stand: Mod assist         General transfer comment: ModA to stand from bed and lower toilet height. Required assist to steady RW and prevent posterior LOB   Ambulation/Gait Ambulation/Gait assistance: Min assist Gait Distance (Feet): 20 Feet Assistive device: Rolling walker (2 wheeled) Gait Pattern/deviations: Step-through pattern;Decreased stride length;Drifts right/left;Trunk flexed;Narrow base of support Gait velocity: Decreased Gait velocity interpretation: 1.31 - 2.62 ft/sec, indicative of limited community ambulator General Gait Details: Amb from bed>bathroom>recliner with RW; pt impulsive with movement with urgency to urinate, requiring minA to prevent LOB and cues for safety   Stairs             Wheelchair Mobility    Modified Rankin (Stroke Patients Only)       Balance Overall balance assessment: Needs assistance   Sitting balance-Leahy Scale: Good       Standing balance-Leahy Scale: Poor Standing balance comment: Reliant on UE support and/or external assist; when attempting to pull up/adjust underwear while standing at toilet, requiried modA to prevent 2x posterior LOB                            Cognition Arousal/Alertness: Awake/alert Behavior During Therapy: WFL for tasks assessed/performed Overall Cognitive Status: History of cognitive impairments - at baseline Area of Impairment: Orientation;Attention;Memory;Following commands;Safety/judgement;Problem solving                 Orientation Level: Disoriented to;Time Current Attention Level: Selective Memory: Decreased short-term memory Following Commands: Follows one step commands inconsistently Safety/Judgement: Decreased awareness of safety   Problem Solving: Requires verbal cues General Comments: Attempting to use TV remove/call bell as  telephone despite multiple attempts to educate/reorient. Pleasantly confused. Decreased command following likely exacerbated by pt being  distracted by urgency to urinate. Spoke with daughter on phone who reports baseline cognitive impairments, but pt becomes disoriented 1-2 days typically when admitted to hospital      Exercises      General Comments General comments (skin integrity, edema, etc.): Spoke with daughter on phone regarding pt's PLOF; family able to continue providing 24/7 assist upon return home      Pertinent Vitals/Pain Pain Assessment: No/denies pain    Home Living                      Prior Function            PT Goals (current goals can now be found in the care plan section) Acute Rehab PT Goals Patient Stated Goal: Go to the bathroom PT Goal Formulation: With patient Time For Goal Achievement: 09/08/18 Potential to Achieve Goals: Fair Progress towards PT goals: Progressing toward goals    Frequency    Min 3X/week      PT Plan Current plan remains appropriate    Co-evaluation              AM-PAC PT "6 Clicks" Daily Activity  Outcome Measure  Difficulty turning over in bed (including adjusting bedclothes, sheets and blankets)?: A Little Difficulty moving from lying on back to sitting on the side of the bed? : A Little Difficulty sitting down on and standing up from a chair with arms (e.g., wheelchair, bedside commode, etc,.)?: Unable Help needed moving to and from a bed to chair (including a wheelchair)?: A Little Help needed walking in hospital room?: A Little Help needed climbing 3-5 steps with a railing? : A Lot 6 Click Score: 15    End of Session Equipment Utilized During Treatment: Gait belt Activity Tolerance: Patient tolerated treatment well Patient left: in chair;with call bell/phone within reach;with chair alarm set Nurse Communication: Mobility status PT Visit Diagnosis: Other abnormalities of gait and mobility (R26.89);Muscle weakness (generalized) (M62.81)     Time: 4098-1191 PT Time Calculation (min) (ACUTE ONLY): 30 min  Charges:  $Gait  Training: 8-22 mins $Therapeutic Activity: 8-22 mins                     Mabeline Caras, PT, DPT Acute Rehabilitation Services  Pager [phone] Office [place] 08/25/2018, 2:55 PM

## 2018-08-25 NOTE — Evaluation (Addendum)
Physical Therapy Evaluation Patient Details Name: Jessica [name] MRN: [id number] DOB: [birth year] Today's Date: 08/25/2018   History of Present Illness  Pt is a 84 y.o. female admitted 08/24/18 for pre-TAVR workup. Incidental findings of right-sided PE on chest imaging, with plans to start anticoagulation 10/30; per cardiology, to push back TAVR procedure secondary to this. PMH includes COPD, CAD, DHD, fibromyalgia, memory loss.    Clinical Impression  Pt presents with an overall decrease in functional mobility secondary to above.Pt able to ambulate with RW and min guard, requiring intermittent minA due to LOB and fatigue; requires modA to stand from lower surface heights. Pt limited by c/o SOB and fatigue; SpO2 90% on 2L O2 [place]. If family able to continue providing 24/7 assist, feel pt would benefit from returning home with HHPT services. Will follow acutely to address established goals.  PT ordered for pre-TAVR assessment. Now with R-sided PE on chest imaging, to start anticoagulation; per cardiology, procedure to be pushed back secondary to this. Will plan to complete full mobility assessment when appropriate.    Follow Up Recommendations Home health PT;Supervision/Assistance - 24 hour(SNF if family not available for assist)    Equipment Recommendations  None recommended by PT    Recommendations for Other Services       Precautions / Restrictions Precautions Precautions: Fall Precaution Comments: 2L O2  baseline Restrictions Weight Bearing Restrictions: No      Mobility  Bed Mobility Overal bed mobility: Needs Assistance Bed Mobility: Supine to Sit;Sit to Supine     Supine to sit: Modified independent (Device/Increase time) Sit to supine: Modified independent (Device/Increase time)   General bed mobility comments: HOB elevated slightly, use of bed rail; no physical assist required  Transfers Overall transfer level: Needs assistance Equipment used: Rolling walker (2  wheeled) Transfers: Sit to/from Stand Sit to Stand: Mod assist         General transfer comment: Required modA to stand from bed and toilet due to posterior lean/extension despite cues  Ambulation/Gait Ambulation/Gait assistance: Min assist;Min guard Gait Distance (Feet): 202 Feet Assistive device: Rolling walker (2 wheeled) Gait Pattern/deviations: Step-through pattern;Decreased stride length;Drifts right/left;Trunk flexed;Narrow base of support Gait velocity: Decreased Gait velocity interpretation: 1.31 - 2.62 ft/sec, indicative of limited community ambulator General Gait Details: Slightly unsteady amb with min guard secondary to poor safety awareness and balance; 2x minA to prevent LOB. Pt c/o SOB and fatigue; SpO2 96% on RA upon return to room. Reports "I'm always dizzy"  Stairs            Wheelchair Mobility    Modified Rankin (Stroke Patients Only)       Balance Overall balance assessment: Needs assistance   Sitting balance-Leahy Scale: Good       Standing balance-Leahy Scale: Poor                               Pertinent Vitals/Pain Pain Assessment: No/denies pain    Home Living Family/patient expects to be discharged to:: Private residence Living Arrangements: Children Available Help at Discharge: Family;Available 24 hours/day Type of Home: House       Home Layout: One level Home Equipment: [place] - 2 wheels;Cane - single point;Shower seat      Prior Function Level of Independence: Needs assistance   Gait / Transfers Assistance Needed: Ambulatory with SPC. Does not drive. 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 Family assists with household tasks        Hand Dominance        Extremity/Trunk Assessment   Upper Extremity Assessment Upper Extremity Assessment: Generalized weakness    Lower  Extremity Assessment Lower Extremity Assessment: Generalized weakness    Cervical / Trunk Assessment Cervical / Trunk Assessment: Kyphotic  Communication   Communication: HOH  Cognition Arousal/Alertness: Awake/alert Behavior During Therapy: WFL for tasks assessed/performed Overall Cognitive Status: History of cognitive impairments - at baseline Area of Impairment: Orientation;Attention;Memory;Following commands;Safety/judgement;Problem solving                 Orientation Level: Disoriented to;Time Current Attention Level: Selective Memory: Decreased short-term memory Following Commands: Follows one step commands consistently Safety/Judgement: Decreased awareness of safety   Problem Solving: Requires verbal cues General Comments: Oriented, except thinking it was 1980 and unsure of month, although knew it was almost Halloween. Restless movements, easily distracted      General Comments      Exercises     Assessment/Plan    PT Assessment Patient needs continued PT services  PT Problem List Decreased strength;Decreased activity tolerance;Decreased balance;Decreased mobility;Decreased cognition;Decreased knowledge of use of DME;Decreased safety awareness;Cardiopulmonary status limiting activity       PT Treatment Interventions DME instruction;Gait training;Stair training;Functional mobility training;Therapeutic activities;Therapeutic exercise;Balance training;Patient/family education    PT Goals (Current goals can be found in the Care Plan section)  Acute Rehab PT Goals Patient Stated Goal: "Get my heart right" PT Goal Formulation: With patient Time For Goal Achievement: 09/08/18 Potential to Achieve Goals: Fair    Frequency Min 3X/week   Barriers to discharge        Co-evaluation               AM-PAC PT "6 Clicks" Daily Activity  Outcome Measure Difficulty turning over in bed (including adjusting bedclothes, sheets and blankets)?: A Little Difficulty  moving from lying on back to sitting on the side of the bed? : Unable Difficulty sitting down on and standing up from a chair with arms (e.g., wheelchair, bedside commode, etc,.)?: Unable Help needed moving to and from a bed to chair (including a wheelchair)?: A Little Help needed walking in hospital room?: A Little Help needed climbing 3-5 steps with a railing? : A Lot 6 Click Score: 13    End of Session Equipment Utilized During Treatment: Gait belt Activity Tolerance: Patient tolerated treatment well Patient left: in bed;with call bell/phone within reach;with bed alarm set Nurse Communication: Mobility status PT Visit Diagnosis: Other abnormalities of gait and mobility (R26.89);Muscle weakness (generalized) (M62.81)    Time: 3299-2426 PT Time Calculation (min) (ACUTE ONLY): 25 min   Charges:   PT Evaluation $PT Eval Moderate Complexity: 1 Mod PT Treatments $Gait Training: 8-22 mins       Mabeline Caras, PT, DPT Acute Rehabilitation Services  Pager [phone] Office [place] 08/25/2018, 11:05 AM

## 2018-08-26 DIAGNOSIS — I82451 Acute embolism and thrombosis of right peroneal vein: Secondary | ICD-10-CM | POA: Diagnosis present

## 2018-08-26 DIAGNOSIS — Z955 Presence of coronary angioplasty implant and graft: Secondary | ICD-10-CM | POA: Diagnosis not present

## 2018-08-26 DIAGNOSIS — I700 Atherosclerosis of aorta: Secondary | ICD-10-CM | POA: Diagnosis present

## 2018-08-26 DIAGNOSIS — E785 Hyperlipidemia, unspecified: Secondary | ICD-10-CM | POA: Diagnosis present

## 2018-08-26 DIAGNOSIS — F411 Generalized anxiety disorder: Secondary | ICD-10-CM | POA: Diagnosis present

## 2018-08-26 DIAGNOSIS — Z79899 Other long term (current) drug therapy: Secondary | ICD-10-CM | POA: Diagnosis not present

## 2018-08-26 DIAGNOSIS — I2699 Other pulmonary embolism without acute cor pulmonale: Secondary | ICD-10-CM | POA: Diagnosis present

## 2018-08-26 DIAGNOSIS — H5501 Congenital nystagmus: Secondary | ICD-10-CM | POA: Diagnosis present

## 2018-08-26 DIAGNOSIS — F419 Anxiety disorder, unspecified: Secondary | ICD-10-CM | POA: Diagnosis present

## 2018-08-26 DIAGNOSIS — Z90710 Acquired absence of both cervix and uterus: Secondary | ICD-10-CM | POA: Diagnosis not present

## 2018-08-26 DIAGNOSIS — G4700 Insomnia, unspecified: Secondary | ICD-10-CM | POA: Diagnosis present

## 2018-08-26 DIAGNOSIS — Z7951 Long term (current) use of inhaled steroids: Secondary | ICD-10-CM | POA: Diagnosis not present

## 2018-08-26 DIAGNOSIS — Z888 Allergy status to other drugs, medicaments and biological substances status: Secondary | ICD-10-CM | POA: Diagnosis not present

## 2018-08-26 DIAGNOSIS — Z9849 Cataract extraction status, unspecified eye: Secondary | ICD-10-CM | POA: Diagnosis not present

## 2018-08-26 DIAGNOSIS — I82441 Acute embolism and thrombosis of right tibial vein: Secondary | ICD-10-CM | POA: Diagnosis present

## 2018-08-26 DIAGNOSIS — J449 Chronic obstructive pulmonary disease, unspecified: Secondary | ICD-10-CM | POA: Diagnosis present

## 2018-08-26 DIAGNOSIS — M797 Fibromyalgia: Secondary | ICD-10-CM | POA: Diagnosis present

## 2018-08-26 DIAGNOSIS — I82431 Acute embolism and thrombosis of right popliteal vein: Secondary | ICD-10-CM | POA: Diagnosis present

## 2018-08-26 DIAGNOSIS — Z91041 Radiographic dye allergy status: Secondary | ICD-10-CM | POA: Diagnosis not present

## 2018-08-26 DIAGNOSIS — I2510 Atherosclerotic heart disease of native coronary artery without angina pectoris: Secondary | ICD-10-CM | POA: Diagnosis present

## 2018-08-26 DIAGNOSIS — M1990 Unspecified osteoarthritis, unspecified site: Secondary | ICD-10-CM | POA: Diagnosis present

## 2018-08-26 DIAGNOSIS — I82411 Acute embolism and thrombosis of right femoral vein: Secondary | ICD-10-CM | POA: Diagnosis present

## 2018-08-26 DIAGNOSIS — I350 Nonrheumatic aortic (valve) stenosis: Secondary | ICD-10-CM | POA: Diagnosis not present

## 2018-08-26 DIAGNOSIS — Z7982 Long term (current) use of aspirin: Secondary | ICD-10-CM | POA: Diagnosis not present

## 2018-08-26 LAB — CBC
HCT: 35 % — ABNORMAL LOW (ref 36.0–46.0)
HEMOGLOBIN: 10.6 g/dL — AB (ref 12.0–15.0)
MCH: 29 pg (ref 26.0–34.0)
MCHC: 30.3 g/dL (ref 30.0–36.0)
MCV: 95.6 fL (ref 80.0–100.0)
PLATELETS: 264 10*3/uL (ref 150–400)
RBC: 3.66 MIL/uL — AB (ref 3.87–5.11)
RDW: 15.5 % (ref 11.5–15.5)
WBC: 9.4 10*3/uL (ref 4.0–10.5)
nRBC: 0 % (ref 0.0–0.2)

## 2018-08-26 LAB — BASIC METABOLIC PANEL
Anion gap: 4 — ABNORMAL LOW (ref 5–15)
BUN: 16 mg/dL (ref 8–23)
CALCIUM: 9.1 mg/dL (ref 8.9–10.3)
CHLORIDE: 104 mmol/L (ref 98–111)
CO2: 27 mmol/L (ref 22–32)
Creatinine, Ser: 0.92 mg/dL (ref 0.44–1.00)
GFR, EST NON AFRICAN AMERICAN: 53 mL/min — AB (ref >60)
Glucose, Bld: 121 mg/dL — ABNORMAL HIGH (ref 70–99)
Potassium: 3.9 mmol/L (ref 3.5–5.1)
SODIUM: 135 mmol/L (ref 135–145)

## 2018-08-26 NOTE — Progress Notes (Signed)
Physical Therapy Treatment Patient Details Name: Jessica [name] MRN: [id number] DOB: December 06, 1925 Today's Date: 08/26/2018    History of Present Illness Pt is a 84 y.o. female admitted 08/24/18 for pre-TAVR workup. Incidental findings of right-sided PE on chest imaging; eliquis initiated 12:15pm on 10/30); per cardiology, to push back TAVR procedure secondary to this. BLE venous duplex 10/30 shows RLE DVT. PMH includes COPD, CAD, DHD, fibromyalgia, memory loss.   PT Comments    Pt with increased confusion this session, attempting to get up and walk in room despite cues/education for safety. Able to transfer and ambulate in room with RW and min-modA for balance. Pt difficult to redirect to current situation. Very poor safety awareness. Continue to recommend 24/7 assist from family upon return home.    Follow Up Recommendations  Home health PT;Supervision/Assistance - 24 hour(daughter verified continued 24/7 assist)     Equipment Recommendations  None recommended by PT    Recommendations for Other Services       Precautions / Restrictions Precautions Precautions: Fall Precaution Comments: 2L O2 [place] baseline Restrictions Weight Bearing Restrictions: No    Mobility  Bed Mobility Overal bed mobility: Needs Assistance Bed Mobility: Supine to Sit     Supine to sit: Supervision     General bed mobility comments: Attempting to get up without assist  Transfers Overall transfer level: Needs assistance Equipment used: Rolling walker (2 wheeled);1 person hand held assist Transfers: Sit to/from Stand Sit to Stand: Min assist;Mod assist         General transfer comment: Min-modA standing from recliner and lower toilet height; pt confused with poor safety awareness, requiring frequent, repeated cues for technique/safety  Ambulation/Gait Ambulation/Gait assistance: Min assist Gait Distance (Feet): 30 Feet Assistive device: Rolling walker (2 wheeled) Gait Pattern/deviations:  Step-through pattern;Decreased stride length;Drifts right/left;Trunk flexed;Narrow base of support Gait velocity: Decreased Gait velocity interpretation: 1.31 - 2.62 ft/sec, indicative of limited community ambulator General Gait Details: Consistent minA to prevent LOB while ambulating throughout room; pt running into objects with RW then attempting to pick it up and turn sideways to use, etc.   Stairs             Wheelchair Mobility    Modified Rankin (Stroke Patients Only)       Balance Overall balance assessment: Needs assistance   Sitting balance-Leahy Scale: Good       Standing balance-Leahy Scale: Poor Standing balance comment: Reliant on UE support and/or external assist                            Cognition Arousal/Alertness: Awake/alert Behavior During Therapy: WFL for tasks assessed/performed Overall Cognitive Status: History of cognitive impairments - at baseline Area of Impairment: Orientation;Attention;Memory;Following commands;Safety/judgement;Problem solving;Awareness                 Orientation Level: Disoriented to;Time;Situation Current Attention Level: Sustained Memory: Decreased short-term memory Following Commands: Follows one step commands inconsistently Safety/Judgement: Decreased awareness of safety;Decreased awareness of deficits Awareness: Intellectual Problem Solving: Requires verbal cues General Comments: Pt with increased confusion today. Attempting to get up multiple times despite repeated cues/education. Thought window was bathroom door. Talking about things not relevant and talking to people not present      Exercises      General Comments        Pertinent Vitals/Pain Pain Assessment: No/denies pain    Home Living  Prior Function            PT Goals (current goals can now be found in the care plan section) Acute Rehab PT Goals Patient Stated Goal: Get the floor cleaned PT  Goal Formulation: With patient Time For Goal Achievement: 09/08/18 Potential to Achieve Goals: Fair Progress towards PT goals: Progressing toward goals    Frequency    Min 3X/week      PT Plan Current plan remains appropriate    Co-evaluation              AM-PAC PT "6 Clicks" Daily Activity  Outcome Measure  Difficulty turning over in bed (including adjusting bedclothes, sheets and blankets)?: A Little Difficulty moving from lying on back to sitting on the side of the bed? : A Little Difficulty sitting down on and standing up from a chair with arms (e.g., wheelchair, bedside commode, etc,.)?: Unable Help needed moving to and from a bed to chair (including a wheelchair)?: A Little Help needed walking in hospital room?: A Little Help needed climbing 3-5 steps with a railing? : A Lot 6 Click Score: 15    End of Session Equipment Utilized During Treatment: Gait belt Activity Tolerance: Patient tolerated treatment well Patient left: in chair;with call bell/phone within reach;with chair alarm set;with nursing/sitter in room Nurse Communication: Mobility status PT Visit Diagnosis: Other abnormalities of gait and mobility (R26.89);Muscle weakness (generalized) (M62.81)     Time: 1443-1540 PT Time Calculation (min) (ACUTE ONLY): 26 min  Charges:  $Gait Training: 8-22 mins $Therapeutic Activity: 8-22 mins                    Mabeline Caras, PT, DPT Acute Rehabilitation Services  Pager [phone] Office [place] 08/26/2018, 12:12 PM

## 2018-08-26 NOTE — Progress Notes (Signed)
6 Minute Walk Test  Distance - 202 ft   5 Meter Walk Test  Trial   1) 13.5 sec   2) 15 sec  3) (unable secondary to fatigue)  Average - 14.25 sec = 0.86 ft/sec   Clinical Frailty Scale - 6     Mabeline Caras, PT, DPT Acute Rehabilitation Services  Pager [phone] Office [phone]

## 2018-08-26 NOTE — Discharge Summary (Addendum)
[place] VALVE TEAM  Discharge Summary    Patient ID: Jessica [name] MRN: [id number]; DOB: 1926-02-20  Admit date: 08/24/2018 Discharge date: 08/27/2018  Primary Care Provider: [name], [name], MD  Primary Cardiologist: [name], MD   Discharge Diagnoses    Principal Problem:   Severe aortic stenosis Active Problems:   Hyperlipidemia   Generalized anxiety disorder   COPD (chronic obstructive pulmonary disease) (HCC)   Chronic bronchitis (HCC)   Insomnia   Aortic atherosclerosis (HCC)   CAD (coronary artery disease)   Pulmonary embolism (HCC)   Allergies Allergies  Allergen Reactions  . Contrast Media [Iodinated Diagnostic Agents] Itching, Rash and Other (See Comments)    Hypotension, Skin turns red like a sunburn  . Iohexol Itching, Rash and Other (See Comments)    Hypotension, Skin turns red like a sunburn    Diagnostic Studies/Procedures    Cardiac CT IMPRESSION: 1. Tri leaflet AV with annular area of 436 mm2 suitable for a 26 mm Sapien 3 valve  2. Normal aortic root 3.5 cm  3. Optimum angiographic angle for deployment LAO 14 Caudal 19 degrees  4. Coronary arteries sufficient height above annulus for deployment  5. No LAA thrombus  ___________  CTA chest abd pelvis IMPRESSION: 1. Incidental acute pulmonary emboli within the lobar and segmental branches of the right upper and right middle lobes. 2. Vascular findings and measurements pertinent to potential TAVR procedure, as detailed above. 3. Severe thickening and calcification of the aortic valve, compatible with the reported clinical history of severe aortic stenosis. 4. Aortic Atherosclerosis (ICD10-I70.0) and Emphysema (ICD10-J43.9). 5. Two-vessel coronary atherosclerosis. 6. Posterior apical right upper lobe 8 x 6 mm solid pulmonary nodule appears slightly increased in size since 2016 chest CT, potentially artifactual due to technical  scan differences. Recommend attention on follow-up chest CT in 3-6 months. 7. Cystic 2.3 cm pancreatic head lesion is increased in size since 2011 CT abdomen study. No biliary or pancreatic duct dilation or other high risk features on this routine CT study. MRI abdomen without and with IV contrast is indicated for further characterization, and may be performed as clinically warranted. Alternatively, follow-up pancreas protocol CT abdomen without and with IV contrast may be obtained in 6-12 months. 8. Marked sigmoid diverticulosis.  ____________  LE dopplers Summary: Right: Findings consistent with acute deep vein thrombosis involving the right femoral vein, right popliteal vein, right posterior tibial vein, and right peroneal vein. No cystic structure found in the popliteal fossa. Pulsatile CFV suggestive of  elevated right sided heart pressure Left: There is no evidence of deep vein thrombosis in the lower extremity. No cystic structure found in the popliteal fossa.    History of Present Illness   84 y.o. female with COPD, CAD, HLD and severe aortic stenosis who is undergoing workup for TAVR and was admitted on 08/24/18 for her pre-TAVR CT scans. She had a bad reaction post cath from the contrast dye so we admitted her for the scans to do pre and post treatment for contrast allergy.  Jessica [name] has severe aortic stenosis. During her pre op work up for TAVR, she had a right and left heart cath on 07/07/18 which showed a severe, heavily calcified (80%) stenosis in the proximal LAD that required revascularization. The patient was discharged home but called in the next day with complaints of an intensely pruritic rash. Steroids were called into her pharmacy. She returned back to [facility] on 07/23/18 for coronary  atherectomy with stent placement to mLAD. Given previous allergy she was pre-medicated with steroids and benadryl. Despite pre medication she still had a delayed contrast reaction greater  than 12 hours after PCI resulting in pruritus and generalized erythema and associated hypotension w/ SBP into the 90s. Due to her severe reaction is was decided to premedicate her before and after scans.     Hospital Course     Consultants: none  1. Severe aortic stenosis: workup for TAVR underway. Pre-TAVR CT scans completed 08/24/18. Treated with prednisone, benadryl and pepcid pre and post scans. This has been successful in preventing a contrast dye reaction.  New finding of DVT/PE will delay TAVR. I will bring her back into the office to see me back in 1 month to discuss timing of TAVR.  2. Acute right lung pulmonary embolism: Incidental finding on her pre-TAVR CT chest. The PE is within the lobar and segmental branches of the right upper and middle lobes. LE dopplers showed right leg DVT. We have started Eliquis 10 mg po BID (full dose given acute PE per pharmacy team). This will be converted to 5mg  BID on 09/01/18 and continued for at least 3 months. Aspirin discontinued. She was continued on plavix given recent stenting. She will follow up with her PCP on 11/11.    3. CAD: Stable. No chest pain. Recent coronary stent placement. Continue plavix. Aspirin stopped given addition of Eliquis   _____________  Discharge Vitals Blood pressure 123/68, pulse 61, temperature 97.6 F (36.4 C), temperature source Oral, resp. rate 20, height 5\' 5"  (1.651 m), weight 59.4 kg, SpO2 96 %.  Filed Weights   08/25/18 0316 08/26/18 0412 08/27/18 0605  Weight: 58.3 kg 59 kg 59.4 kg    Labs & Radiologic Studies    CBC Recent Labs    08/26/18 0649  WBC 9.4  HGB 10.6*  HCT 35.0*  MCV 95.6  PLT 568   Basic Metabolic Panel Recent Labs    08/26/18 0649 08/27/18 0506  NA 135 139  K 3.9 3.6  CL 104 106  CO2 27 29  GLUCOSE 121* 89  BUN 16 17  CREATININE 0.92 0.89  CALCIUM 9.1 9.1   Liver Function Tests Recent Labs    08/25/18 0540  AST 20  ALT 11  ALKPHOS 106  BILITOT 0.5  PROT  6.4*  ALBUMIN 3.2*   No results for input(s): LIPASE, AMYLASE in the last 72 hours. Cardiac Enzymes No results for input(s): CKTOTAL, CKMB, CKMBINDEX, TROPONINI in the last 72 hours. BNP Invalid input(s): POCBNP D-Dimer No results for input(s): DDIMER in the last 72 hours. Hemoglobin A1C No results for input(s): HGBA1C in the last 72 hours. Fasting Lipid Panel No results for input(s): CHOL, HDL, LDLCALC, TRIG, CHOLHDL, LDLDIRECT in the last 72 hours. Thyroid Function Tests No results for input(s): TSH, T4TOTAL, T3FREE, THYROIDAB in the last 72 hours.  Invalid input(s): FREET3 _____________  Ct Coronary Morph W/cta Cor W/score W/ca W/cm &/or Wo/cm  Addendum Date: 08/25/2018   ADDENDUM REPORT: 08/25/2018 10:12 EXAM: OVER-READ INTERPRETATION  CT CHEST The following report is an over-read performed by radiologist Dr. Samara Snide [facility] on 08/25/2018. This over-read does not include interpretation of cardiac or coronary anatomy or pathology. The coronary CTA interpretation by the cardiologist is attached. COMPARISON:  12/28/2014 chest CT. FINDINGS: Please see the separate concurrent chest CT angiogram report for details. IMPRESSION: Please see the separate concurrent chest CT angiogram report for details. Electronically Signed   By:  Ilona Sorrel M.D.   On: 08/25/2018 10:12   Result Date: 08/25/2018 CLINICAL DATA:  Aortic Stenosis EXAM: Cardiac TAVR CT TECHNIQUE: The patient was scanned on a Siemens Force 950 slice scanner. A 120 kV retrospective scan was triggered in the ascending thoracic aorta at 140 HU's. Gantry rotation speed was 250 msecs and collimation was .6 mm. No beta blockade or nitro were given. The 3D data set was reconstructed in 5% intervals of the R-R cycle. Systolic and diastolic phases were analyzed on a dedicated work station using MPR, MIP and VRT modes. The patient received 80 cc of contrast. FINDINGS: Aortic Valve: Tri leaflet and calcified with restricted  leaflet motion Aorta: Normal arch vessels severe calcific atherosclerosis Sino-tubular Junction: 29 mm Ascending Thoracic Aorta: 35 mm Aortic Arch: 27 mm Descending Thoracic Aorta: 23 mm Sinus of Valsalva Measurements: Non-coronary: 34.5 mm Right - coronary: 33 mm Left -   coronary: 32.5 mm Coronary Artery Height above Annulus: Left Main: 16.7 mm above annulus Right Coronary: 15.5 mm above annulus Virtual Basal Annulus Measurements: Maximum / Minimum Diameter: 26.3 mm x 21.7 mm Perimeter: 76 mm Area: 436 mm2 Coronary Arteries: Sufficient height above annulus for deployment Optimum Fluoroscopic Angle for Delivery: LAO 14 Caudal 19 degrees IMPRESSION: 1. Tri leaflet AV with annular area of 436 mm2 suitable for a 26 mm Sapien 3 valve 2.  Normal aortic root 3.5 cm 3. Optimum angiographic angle for deployment LAO 14 Caudal 19 degrees 4.  Coronary arteries sufficient height above annulus for deployment 5.  No LAA thrombus Jenkins Rouge Electronically Signed: By: Jenkins Rouge M.D. On: 08/24/2018 18:12   Ct Angio Chest Aorta W &/or Wo Contrast  Result Date: 08/25/2018 CLINICAL DATA:  Severe symptomatic aortic stenosis with dyspnea on exertion. Pre-TAVR evaluation. EXAM: CT ANGIOGRAPHY CHEST, ABDOMEN AND PELVIS TECHNIQUE: Multidetector CT imaging through the chest, abdomen and pelvis was performed using the standard protocol during bolus administration of intravenous contrast. Multiplanar reconstructed images and MIPs were obtained and reviewed to evaluate the vascular anatomy. CONTRAST:  160mL ISOVUE-370 IOPAMIDOL (ISOVUE-370) INJECTION 76% COMPARISON:  12/28/2014 chest CT.  06/12/2010 CT abdomen/pelvis. FINDINGS: CTA CHEST FINDINGS Cardiovascular: Normal heart size. No significant pericardial effusion/thickening. Left anterior descending and left circumflex coronary atherosclerosis. Severe thickening and calcification of the aortic valve. Atherosclerotic nonaneurysmal thoracic aorta. Normal caliber pulmonary arteries.  Incidentally noted are acute pulmonary emboli within lobar and segmental branches of the right upper and right middle lobes. Mediastinum/Nodes: No discrete thyroid nodules. Unremarkable esophagus. No pathologically enlarged axillary, mediastinal or hilar lymph nodes. Lungs/Pleura: No pneumothorax. No pleural effusion. Mild centrilobular emphysema with diffuse bronchial wall thickening. Posterior apical right upper lobe 8 x 6 mm solid pulmonary nodule (series 5/image 13) appears slightly increased from 8 x 4 mm on 12/28/2014 chest CT. Basilar left lower lobe 6 mm solid pulmonary nodule (series 5/image 89) is stable and considered benign. No acute consolidative airspace disease, lung masses or new significant pulmonary nodules. Musculoskeletal: No aggressive appearing focal osseous lesions. Partially visualized right shoulder arthroplasty. Moderate thoracic spondylosis. CTA ABDOMEN AND PELVIS FINDINGS Hepatobiliary: Normal liver with no liver mass. Normal gallbladder with no radiopaque cholelithiasis. No intrahepatic biliary ductal dilatation. CBD diameter 6-7 mm, within normal limits for age. Pancreas: Cystic 2.3 x 1.5 cm pancreatic head lesion (series 4/image 116), increased from 1.2 x 0.7 cm on 06/12/2010 CT. No additional pancreatic lesions. No pancreatic duct dilation. Spleen: Normal size. No mass. Adrenals/Urinary Tract: Normal adrenals. Simple 1.8 cm interpolar left renal cyst.  Subcentimeter hypodense renal cortical lesion in the lower left kidney is too small to characterize and requires no follow-up. Multiple parapelvic renal cysts in both kidneys. No hydronephrosis. No additional contour deforming renal lesions. Normal bladder. Stomach/Bowel: Normal non-distended stomach. Normal caliber small bowel with no small bowel wall thickening. Appendix not discretely visualized. No pericecal inflammatory changes. Marked sigmoid diverticulosis, with no large bowel wall thickening or significant pericolonic fat  stranding. Vascular/Lymphatic: Atherosclerotic nonaneurysmal abdominal aorta. Patent renal and splenic veins. No pathologically enlarged lymph nodes in the abdomen or pelvis. Reproductive: Status post hysterectomy, with no abnormal findings at the vaginal cuff. No adnexal mass. Other: No pneumoperitoneum, ascites or focal fluid collection. Musculoskeletal: No aggressive appearing focal osseous lesions. Marked lumbar spondylosis. VASCULAR MEASUREMENTS PERTINENT TO TAVR: AORTA: Minimal Aortic Diameter-12.4 x 11.9 mm (infrarenal abdominal aorta on series 4/image 122) Severity of Aortic Calcification-moderate RIGHT PELVIS: Right Common Iliac Artery - Minimal Diameter-8.9 x 7.8 mm Tortuosity-mild Calcification-moderate Right External Iliac Artery - Minimal Diameter-7.7 x 7.1 mm Tortuosity-moderate Calcification-none Right Common Femoral Artery - Minimal Diameter-7.3 x 6.7 mm Tortuosity-moderate Calcification-mild LEFT PELVIS: Left Common Iliac Artery - Minimal Diameter-9.7 x 8.9 mm Tortuosity-mild Calcification-moderate Left External Iliac Artery - Minimal Diameter-7.1 x 6.9 mm Tortuosity-moderate Calcification-mild Left Common Femoral Artery - Minimal Diameter-7.7 x 7.1 mm Tortuosity-mild Calcification-mild Review of the MIP images confirms the above findings. IMPRESSION: 1. Incidental acute pulmonary emboli within the lobar and segmental branches of the right upper and right middle lobes. 2. Vascular findings and measurements pertinent to potential TAVR procedure, as detailed above. 3. Severe thickening and calcification of the aortic valve, compatible with the reported clinical history of severe aortic stenosis. 4. Aortic Atherosclerosis (ICD10-I70.0) and Emphysema (ICD10-J43.9). 5. Two-vessel coronary atherosclerosis. 6. Posterior apical right upper lobe 8 x 6 mm solid pulmonary nodule appears slightly increased in size since 2016 chest CT, potentially artifactual due to technical scan differences. Recommend  attention on follow-up chest CT in 3-6 months. 7. Cystic 2.3 cm pancreatic head lesion is increased in size since 2011 CT abdomen study. No biliary or pancreatic duct dilation or other high risk features on this routine CT study. MRI abdomen without and with IV contrast is indicated for further characterization, and may be performed as clinically warranted. Alternatively, follow-up pancreas protocol CT abdomen without and with IV contrast may be obtained in 6-12 months. 8. Marked sigmoid diverticulosis. Critical Value/emergent results were called by telephone at the time of interpretation on 08/25/2018 at 10:11 am to Dr. Lauree Chandler , who verbally acknowledged these results. Electronically Signed   By: Ilona Sorrel M.D.   On: 08/25/2018 10:14   Vas [name] Lower Extremity Venous (dvt)  Result Date: 08/25/2018  Lower Venous Study Indications: Pulmonary embolism.  Performing Technologist: Abram Sander  Examination Guidelines: A complete evaluation includes B-mode imaging, spectral Doppler, color Doppler, and power Doppler as needed of all accessible portions of each vessel. Bilateral testing is considered an integral part of a complete examination. Limited examinations for reoccurring indications may be performed as noted.  Right Venous Findings: +---------+---------------+---------+-----------+----------+---------+          CompressibilityPhasicitySpontaneityPropertiesSummary   +---------+---------------+---------+-----------+----------+---------+ CFV      Full           No                            pulsatile +---------+---------------+---------+-----------+----------+---------+ SFJ      Full                                                   +---------+---------------+---------+-----------+----------+---------+  FV Prox  None                                         Acute     +---------+---------------+---------+-----------+----------+---------+ FV Mid   None                                          Acute     +---------+---------------+---------+-----------+----------+---------+ FV DistalNone                                         Acute     +---------+---------------+---------+-----------+----------+---------+ PFV      Full                                                   +---------+---------------+---------+-----------+----------+---------+ POP      None           No       No                   Acute     +---------+---------------+---------+-----------+----------+---------+ PTV      None                                         Acute     +---------+---------------+---------+-----------+----------+---------+ PERO     None                                         Acute     +---------+---------------+---------+-----------+----------+---------+  Left Venous Findings: +---------+---------------+---------+-----------+----------+-------+          CompressibilityPhasicitySpontaneityPropertiesSummary +---------+---------------+---------+-----------+----------+-------+ CFV      Full           Yes      Yes                          +---------+---------------+---------+-----------+----------+-------+ SFJ      Full                                                 +---------+---------------+---------+-----------+----------+-------+ FV Prox  Full                                                 +---------+---------------+---------+-----------+----------+-------+ FV Mid   Full                                                 +---------+---------------+---------+-----------+----------+-------+ FV DistalFull                                                 +---------+---------------+---------+-----------+----------+-------+  PFV      Full                                                 +---------+---------------+---------+-----------+----------+-------+ POP      Full           Yes      Yes                           +---------+---------------+---------+-----------+----------+-------+ PTV      Full                                                 +---------+---------------+---------+-----------+----------+-------+    Summary: Right: Findings consistent with acute deep vein thrombosis involving the right femoral vein, right popliteal vein, right posterior tibial vein, and right peroneal vein. No cystic structure found in the popliteal fossa. Pulsatile CFV suggestive of elevated right sided heart pressure Left: There is no evidence of deep vein thrombosis in the lower extremity. No cystic structure found in the popliteal fossa.  *See table(s) above for measurements and observations. Electronically signed by Curt Jews MD on 08/25/2018 at 3:34:14 PM.    Final    Ct Angio Abd/pel W/ And/or W/o  Result Date: 08/25/2018 CLINICAL DATA:  Severe symptomatic aortic stenosis with dyspnea on exertion. Pre-TAVR evaluation. EXAM: CT ANGIOGRAPHY CHEST, ABDOMEN AND PELVIS TECHNIQUE: Multidetector CT imaging through the chest, abdomen and pelvis was performed using the standard protocol during bolus administration of intravenous contrast. Multiplanar reconstructed images and MIPs were obtained and reviewed to evaluate the vascular anatomy. CONTRAST:  177mL ISOVUE-370 IOPAMIDOL (ISOVUE-370) INJECTION 76% COMPARISON:  12/28/2014 chest CT.  06/12/2010 CT abdomen/pelvis. FINDINGS: CTA CHEST FINDINGS Cardiovascular: Normal heart size. No significant pericardial effusion/thickening. Left anterior descending and left circumflex coronary atherosclerosis. Severe thickening and calcification of the aortic valve. Atherosclerotic nonaneurysmal thoracic aorta. Normal caliber pulmonary arteries. Incidentally noted are acute pulmonary emboli within lobar and segmental branches of the right upper and right middle lobes. Mediastinum/Nodes: No discrete thyroid nodules. Unremarkable esophagus. No pathologically enlarged axillary, mediastinal or hilar  lymph nodes. Lungs/Pleura: No pneumothorax. No pleural effusion. Mild centrilobular emphysema with diffuse bronchial wall thickening. Posterior apical right upper lobe 8 x 6 mm solid pulmonary nodule (series 5/image 13) appears slightly increased from 8 x 4 mm on 12/28/2014 chest CT. Basilar left lower lobe 6 mm solid pulmonary nodule (series 5/image 89) is stable and considered benign. No acute consolidative airspace disease, lung masses or new significant pulmonary nodules. Musculoskeletal: No aggressive appearing focal osseous lesions. Partially visualized right shoulder arthroplasty. Moderate thoracic spondylosis. CTA ABDOMEN AND PELVIS FINDINGS Hepatobiliary: Normal liver with no liver mass. Normal gallbladder with no radiopaque cholelithiasis. No intrahepatic biliary ductal dilatation. CBD diameter 6-7 mm, within normal limits for age. Pancreas: Cystic 2.3 x 1.5 cm pancreatic head lesion (series 4/image 116), increased from 1.2 x 0.7 cm on 06/12/2010 CT. No additional pancreatic lesions. No pancreatic duct dilation. Spleen: Normal size. No mass. Adrenals/Urinary Tract: Normal adrenals. Simple 1.8 cm interpolar left renal cyst. Subcentimeter hypodense renal cortical lesion in the lower left kidney is too small to characterize and requires no follow-up. Multiple parapelvic renal cysts in both kidneys. No hydronephrosis.  No additional contour deforming renal lesions. Normal bladder. Stomach/Bowel: Normal non-distended stomach. Normal caliber small bowel with no small bowel wall thickening. Appendix not discretely visualized. No pericecal inflammatory changes. Marked sigmoid diverticulosis, with no large bowel wall thickening or significant pericolonic fat stranding. Vascular/Lymphatic: Atherosclerotic nonaneurysmal abdominal aorta. Patent renal and splenic veins. No pathologically enlarged lymph nodes in the abdomen or pelvis. Reproductive: Status post hysterectomy, with no abnormal findings at the vaginal cuff.  No adnexal mass. Other: No pneumoperitoneum, ascites or focal fluid collection. Musculoskeletal: No aggressive appearing focal osseous lesions. Marked lumbar spondylosis. VASCULAR MEASUREMENTS PERTINENT TO TAVR: AORTA: Minimal Aortic Diameter-12.4 x 11.9 mm (infrarenal abdominal aorta on series 4/image 122) Severity of Aortic Calcification-moderate RIGHT PELVIS: Right Common Iliac Artery - Minimal Diameter-8.9 x 7.8 mm Tortuosity-mild Calcification-moderate Right External Iliac Artery - Minimal Diameter-7.7 x 7.1 mm Tortuosity-moderate Calcification-none Right Common Femoral Artery - Minimal Diameter-7.3 x 6.7 mm Tortuosity-moderate Calcification-mild LEFT PELVIS: Left Common Iliac Artery - Minimal Diameter-9.7 x 8.9 mm Tortuosity-mild Calcification-moderate Left External Iliac Artery - Minimal Diameter-7.1 x 6.9 mm Tortuosity-moderate Calcification-mild Left Common Femoral Artery - Minimal Diameter-7.7 x 7.1 mm Tortuosity-mild Calcification-mild Review of the MIP images confirms the above findings. IMPRESSION: 1. Incidental acute pulmonary emboli within the lobar and segmental branches of the right upper and right middle lobes. 2. Vascular findings and measurements pertinent to potential TAVR procedure, as detailed above. 3. Severe thickening and calcification of the aortic valve, compatible with the reported clinical history of severe aortic stenosis. 4. Aortic Atherosclerosis (ICD10-I70.0) and Emphysema (ICD10-J43.9). 5. Two-vessel coronary atherosclerosis. 6. Posterior apical right upper lobe 8 x 6 mm solid pulmonary nodule appears slightly increased in size since 2016 chest CT, potentially artifactual due to technical scan differences. Recommend attention on follow-up chest CT in 3-6 months. 7. Cystic 2.3 cm pancreatic head lesion is increased in size since 2011 CT abdomen study. No biliary or pancreatic duct dilation or other high risk features on this routine CT study. MRI abdomen without and with IV contrast  is indicated for further characterization, and may be performed as clinically warranted. Alternatively, follow-up pancreas protocol CT abdomen without and with IV contrast may be obtained in 6-12 months. 8. Marked sigmoid diverticulosis. Critical Value/emergent results were called by telephone at the time of interpretation on 08/25/2018 at 10:11 am to Dr. Lauree Chandler , who verbally acknowledged these results. Electronically Signed   By: Ilona Sorrel M.D.   On: 08/25/2018 10:14   Disposition   Pt is being discharged home today in good condition.  Follow-up Plans & Appointments    Follow-up Information    [name], [name], MD. Go on 09/06/2018.   Specialty:  Family Medicine Why:  @ 9:15 am Contact information: [place] [id number] [phone]        Eileen Stanford, PA-C. Go on 09/30/2018.   Specialties:  Cardiology, Radiology Why:  @ 1:30pm, please arrive at least 10 minutes early Contact information: [name] [id number] [phone]            Discharge Medications   Allergies as of 08/27/2018      Reactions   Contrast Media [iodinated Diagnostic Agents] Itching, Rash, Other (See Comments)   Hypotension, Skin turns red like a sunburn   Iohexol Itching, Rash, Other (See Comments)   Hypotension, Skin turns red like a sunburn      Medication List    STOP taking these medications   amoxicillin 250  MG capsule Commonly known as:  AMOXIL   aspirin 81 MG tablet   predniSONE 50 MG tablet Commonly known as:  DELTASONE   VITAMIN B-12 PO     TAKE these medications   albuterol (2.5 MG/3ML) 0.083% nebulizer solution Commonly known as:  PROVENTIL Take 3 mLs (2.5 mg total) by nebulization every 4 (four) hours as needed for wheezing or shortness of breath. DX: COPD J44.9   albuterol 108 (90 Base) MCG/ACT inhaler Commonly known as:  PROVENTIL HFA;VENTOLIN HFA Inhale 2 puffs into the lungs every 6 (six)  hours as needed for wheezing or shortness of breath.   ALPRAZolam 0.25 MG tablet Commonly known as:  XANAX TAKE 1/2 TO 1 TABLET BY MOUTH TWICE DAILY AS NEEDED. NO MORE THAN 45 TABS PER MONTH What changed:    how much to take  how to take this  when to take this   apixaban 5 MG Tabs tablet Commonly known as:  ELIQUIS Take 2 tablets (10 mg total) by mouth 2 (two) times daily for 4 days.   apixaban 5 MG Tabs tablet Commonly known as:  ELIQUIS Take 1 tablet (5 mg total) by mouth 2 (two) times daily. Start taking on:  09/01/2018   arformoterol 15 MCG/2ML Nebu Commonly known as:  BROVANA Take 2 mLs (15 mcg total) by nebulization 2 (two) times daily. DX: COPD J44.9 What changed:  additional instructions   budesonide 0.5 MG/2ML nebulizer solution Commonly known as:  PULMICORT Take 2 mLs (0.5 mg total) by nebulization 2 (two) times daily. DX: COPD J44.9   clopidogrel 75 MG tablet Commonly known as:  PLAVIX Take 1 tablet (75 mg total) by mouth daily.   escitalopram 20 MG tablet Commonly known as:  LEXAPRO Take 1 tablet (20 mg total) by mouth daily.   multivitamin-lutein Caps capsule Take 1 capsule by mouth daily.   simvastatin 20 MG tablet Commonly known as:  ZOCOR Take 1 tablet (20 mg total) by mouth at bedtime.   traZODone 50 MG tablet Commonly known as:  DESYREL Take 0.5-1 tablets (25-50 mg total) by mouth at bedtime as needed for sleep.   Vitamin D 1000 units capsule Take 1,000 Units by mouth daily.         Outstanding Labs/Studies   None  Duration of Discharge Encounter   Greater than 30 minutes including physician time.  Signed, [name], PA-C 08/27/2018, 8:52 AM [phone]  I have personally seen and examined this patient. I agree with the assessment and plan as outlined above.  She was admitted for her pre-TAVR CT scans due to previous severe contrast reaction. She has had no rash this time with use of steroids and Benadryl pre and post  scans. She was found to have acute DVT and PE. She is now on Eliquis. Will continue 10 mg BID for one week total and then 5 mg BID after that. Will need to delay TAVR for now given acute DVT and PE. I suspect we will have to delay TAVR for at least 3 months.   Discharge home today. Follow up PCP one week and then valve clinic one month.   Lauree Chandler 08/27/2018 9:08 AM

## 2018-08-26 NOTE — Plan of Care (Signed)
  Problem: Education: Goal: Knowledge of General Education information will improve Description Including pain rating scale, medication(s)/side effects and non-pharmacologic comfort measures Outcome: Progressing   Problem: Clinical Measurements: Goal: Ability to maintain clinical measurements within normal limits will improve Outcome: Progressing   Problem: Clinical Measurements: Goal: Cardiovascular complication will be avoided Outcome: Progressing   Problem: Nutrition: Goal: Adequate nutrition will be maintained Outcome: Progressing   Problem: Skin Integrity: Goal: Risk for impaired skin integrity will decrease Outcome: Progressing   Problem: Safety: Goal: Ability to remain free from injury will improve Outcome: Progressing

## 2018-08-26 NOTE — Progress Notes (Addendum)
Progress Note  Patient Name: GETHSEMANE FISCHLER Date of Encounter: 08/26/2018  Primary Cardiologist: [name], MD   Subjective   C/o dyspnea. No rash. No chest pain  Inpatient Medications    Scheduled Meds: . ALPRAZolam  0.25 mg Oral QHS  . apixaban  10 mg Oral BID   Followed by  . [START ON 09/01/2018] apixaban  5 mg Oral BID  . arformoterol  15 mcg Nebulization BID  . budesonide  0.5 mg Nebulization BID  . clopidogrel  75 mg Oral Daily  . diphenhydrAMINE  50 mg Intravenous Q6H  . escitalopram  20 mg Oral Daily  . famotidine  20 mg Oral Q12H  . mouth rinse  15 mL Mouth Rinse BID  . predniSONE  50 mg Oral Q6H  . simvastatin  20 mg Oral QHS  . sodium chloride flush  3 mL Intravenous Q12H   Continuous Infusions: . sodium chloride     PRN Meds: sodium chloride, acetaminophen, ondansetron (ZOFRAN) IV, sodium chloride flush, traZODone   Vital Signs    Vitals:   08/25/18 1940 08/25/18 1941 08/26/18 0412 08/26/18 0813  BP:   128/63 (!) 125/106  Pulse:   68 64  Resp:   18 14  Temp:   98.3 F (36.8 C) 97.6 F (36.4 C)  TempSrc:   Oral Oral  SpO2: 97% 97% 98% 97%  Weight:   59 kg   Height:        Intake/Output Summary (Last 24 hours) at 08/26/2018 0814 Last data filed at 08/26/2018 0429 Gross per 24 hour  Intake 243 ml  Output 1200 ml  Net -957 ml   Filed Weights   08/24/18 1412 08/25/18 0316 08/26/18 0412  Weight: 57.9 kg 58.3 kg 59 kg    Telemetry    sinus - Personally Reviewed  ECG    none  Physical Exam   GEN: No acute distress.   Neck: No JVD Cardiac: RRR, loud harsh systolic murmur.   Respiratory: Clear to auscultation bilaterally. GI: Soft, nontender, non-distended  MS: No edema; No deformity. Neuro:  Nonfocal  Psych: Normal affect   Labs    Chemistry Recent Labs  Lab 08/24/18 1440 08/25/18 0540 08/26/18 0649  NA 139 138 135  K 4.3 3.9 3.9  CL 107 104 104  CO2 23 26 27   GLUCOSE 149* 135* 121*  BUN 10 16 16   CREATININE  0.91 0.97 0.92  CALCIUM 9.4 9.3 9.1  PROT  --  6.4*  --   ALBUMIN  --  3.2*  --   AST  --  20  --   ALT  --  11  --   ALKPHOS  --  106  --   BILITOT  --  0.5  --   GFRNONAA 54* 50* 53*  GFRAA >60 58* >60  ANIONGAP 9 8 4*     Hematology Recent Labs  Lab 08/26/18 0649  WBC 9.4  RBC 3.66*  HGB 10.6*  HCT 35.0*  MCV 95.6  MCH 29.0  MCHC 30.3  RDW 15.5  PLT 264    Cardiac EnzymesNo results for input(s): TROPONINI in the last 168 hours. No results for input(s): TROPIPOC in the last 168 hours.   BNPNo results for input(s): BNP, PROBNP in the last 168 hours.   DDimer No results for input(s): DDIMER in the last 168 hours.   Radiology    Ct Coronary Morph W/cta Cor W/score W/ca W/cm &/or Wo/cm  Addendum Date: 08/25/2018  ADDENDUM REPORT: 08/25/2018 10:12 EXAM: OVER-READ INTERPRETATION  CT CHEST The following report is an over-read performed by radiologist Dr. Samara Snide [facility] on 08/25/2018. This over-read does not include interpretation of cardiac or coronary anatomy or pathology. The coronary CTA interpretation by the cardiologist is attached. COMPARISON:  12/28/2014 chest CT. FINDINGS: Please see the separate concurrent chest CT angiogram report for details. IMPRESSION: Please see the separate concurrent chest CT angiogram report for details. Electronically Signed   By: Ilona Sorrel M.D.   On: 08/25/2018 10:12   Result Date: 08/25/2018 CLINICAL DATA:  Aortic Stenosis EXAM: Cardiac TAVR CT TECHNIQUE: The patient was scanned on a Siemens Force 497 slice scanner. A 120 kV retrospective scan was triggered in the ascending thoracic aorta at 140 HU's. Gantry rotation speed was 250 msecs and collimation was .6 mm. No beta blockade or nitro were given. The 3D data set was reconstructed in 5% intervals of the R-R cycle. Systolic and diastolic phases were analyzed on a dedicated work station using MPR, MIP and VRT modes. The patient received 80 cc of contrast. FINDINGS:  Aortic Valve: Tri leaflet and calcified with restricted leaflet motion Aorta: Normal arch vessels severe calcific atherosclerosis Sino-tubular Junction: 29 mm Ascending Thoracic Aorta: 35 mm Aortic Arch: 27 mm Descending Thoracic Aorta: 23 mm Sinus of Valsalva Measurements: Non-coronary: 34.5 mm Right - coronary: 33 mm Left -   coronary: 32.5 mm Coronary Artery Height above Annulus: Left Main: 16.7 mm above annulus Right Coronary: 15.5 mm above annulus Virtual Basal Annulus Measurements: Maximum / Minimum Diameter: 26.3 mm x 21.7 mm Perimeter: 76 mm Area: 436 mm2 Coronary Arteries: Sufficient height above annulus for deployment Optimum Fluoroscopic Angle for Delivery: LAO 14 Caudal 19 degrees IMPRESSION: 1. Tri leaflet AV with annular area of 436 mm2 suitable for a 26 mm Sapien 3 valve 2.  Normal aortic root 3.5 cm 3. Optimum angiographic angle for deployment LAO 14 Caudal 19 degrees 4.  Coronary arteries sufficient height above annulus for deployment 5.  No LAA thrombus Jenkins Rouge Electronically Signed: By: Jenkins Rouge M.D. On: 08/24/2018 18:12   Ct Angio Chest Aorta W &/or Wo Contrast  Result Date: 08/25/2018 CLINICAL DATA:  Severe symptomatic aortic stenosis with dyspnea on exertion. Pre-TAVR evaluation. EXAM: CT ANGIOGRAPHY CHEST, ABDOMEN AND PELVIS TECHNIQUE: Multidetector CT imaging through the chest, abdomen and pelvis was performed using the standard protocol during bolus administration of intravenous contrast. Multiplanar reconstructed images and MIPs were obtained and reviewed to evaluate the vascular anatomy. CONTRAST:  145mL ISOVUE-370 IOPAMIDOL (ISOVUE-370) INJECTION 76% COMPARISON:  12/28/2014 chest CT.  06/12/2010 CT abdomen/pelvis. FINDINGS: CTA CHEST FINDINGS Cardiovascular: Normal heart size. No significant pericardial effusion/thickening. Left anterior descending and left circumflex coronary atherosclerosis. Severe thickening and calcification of the aortic valve. Atherosclerotic  nonaneurysmal thoracic aorta. Normal caliber pulmonary arteries. Incidentally noted are acute pulmonary emboli within lobar and segmental branches of the right upper and right middle lobes. Mediastinum/Nodes: No discrete thyroid nodules. Unremarkable esophagus. No pathologically enlarged axillary, mediastinal or hilar lymph nodes. Lungs/Pleura: No pneumothorax. No pleural effusion. Mild centrilobular emphysema with diffuse bronchial wall thickening. Posterior apical right upper lobe 8 x 6 mm solid pulmonary nodule (series 5/image 13) appears slightly increased from 8 x 4 mm on 12/28/2014 chest CT. Basilar left lower lobe 6 mm solid pulmonary nodule (series 5/image 89) is stable and considered benign. No acute consolidative airspace disease, lung masses or new significant pulmonary nodules. Musculoskeletal: No aggressive appearing focal osseous lesions. Partially visualized  right shoulder arthroplasty. Moderate thoracic spondylosis. CTA ABDOMEN AND PELVIS FINDINGS Hepatobiliary: Normal liver with no liver mass. Normal gallbladder with no radiopaque cholelithiasis. No intrahepatic biliary ductal dilatation. CBD diameter 6-7 mm, within normal limits for age. Pancreas: Cystic 2.3 x 1.5 cm pancreatic head lesion (series 4/image 116), increased from 1.2 x 0.7 cm on 06/12/2010 CT. No additional pancreatic lesions. No pancreatic duct dilation. Spleen: Normal size. No mass. Adrenals/Urinary Tract: Normal adrenals. Simple 1.8 cm interpolar left renal cyst. Subcentimeter hypodense renal cortical lesion in the lower left kidney is too small to characterize and requires no follow-up. Multiple parapelvic renal cysts in both kidneys. No hydronephrosis. No additional contour deforming renal lesions. Normal bladder. Stomach/Bowel: Normal non-distended stomach. Normal caliber small bowel with no small bowel wall thickening. Appendix not discretely visualized. No pericecal inflammatory changes. Marked sigmoid diverticulosis, with no  large bowel wall thickening or significant pericolonic fat stranding. Vascular/Lymphatic: Atherosclerotic nonaneurysmal abdominal aorta. Patent renal and splenic veins. No pathologically enlarged lymph nodes in the abdomen or pelvis. Reproductive: Status post hysterectomy, with no abnormal findings at the vaginal cuff. No adnexal mass. Other: No pneumoperitoneum, ascites or focal fluid collection. Musculoskeletal: No aggressive appearing focal osseous lesions. Marked lumbar spondylosis. VASCULAR MEASUREMENTS PERTINENT TO TAVR: AORTA: Minimal Aortic Diameter-12.4 x 11.9 mm (infrarenal abdominal aorta on series 4/image 122) Severity of Aortic Calcification-moderate RIGHT PELVIS: Right Common Iliac Artery - Minimal Diameter-8.9 x 7.8 mm Tortuosity-mild Calcification-moderate Right External Iliac Artery - Minimal Diameter-7.7 x 7.1 mm Tortuosity-moderate Calcification-none Right Common Femoral Artery - Minimal Diameter-7.3 x 6.7 mm Tortuosity-moderate Calcification-mild LEFT PELVIS: Left Common Iliac Artery - Minimal Diameter-9.7 x 8.9 mm Tortuosity-mild Calcification-moderate Left External Iliac Artery - Minimal Diameter-7.1 x 6.9 mm Tortuosity-moderate Calcification-mild Left Common Femoral Artery - Minimal Diameter-7.7 x 7.1 mm Tortuosity-mild Calcification-mild Review of the MIP images confirms the above findings. IMPRESSION: 1. Incidental acute pulmonary emboli within the lobar and segmental branches of the right upper and right middle lobes. 2. Vascular findings and measurements pertinent to potential TAVR procedure, as detailed above. 3. Severe thickening and calcification of the aortic valve, compatible with the reported clinical history of severe aortic stenosis. 4. Aortic Atherosclerosis (ICD10-I70.0) and Emphysema (ICD10-J43.9). 5. Two-vessel coronary atherosclerosis. 6. Posterior apical right upper lobe 8 x 6 mm solid pulmonary nodule appears slightly increased in size since 2016 chest CT, potentially  artifactual due to technical scan differences. Recommend attention on follow-up chest CT in 3-6 months. 7. Cystic 2.3 cm pancreatic head lesion is increased in size since 2011 CT abdomen study. No biliary or pancreatic duct dilation or other high risk features on this routine CT study. MRI abdomen without and with IV contrast is indicated for further characterization, and may be performed as clinically warranted. Alternatively, follow-up pancreas protocol CT abdomen without and with IV contrast may be obtained in 6-12 months. 8. Marked sigmoid diverticulosis. Critical Value/emergent results were called by telephone at the time of interpretation on 08/25/2018 at 10:11 am to Dr. Lauree Chandler , who verbally acknowledged these results. Electronically Signed   By: Ilona Sorrel M.D.   On: 08/25/2018 10:14   Vas [name] Lower Extremity Venous (dvt)  Result Date: 08/25/2018  Lower Venous Study Indications: Pulmonary embolism.  Performing Technologist: Abram Sander  Examination Guidelines: A complete evaluation includes B-mode imaging, spectral Doppler, color Doppler, and power Doppler as needed of all accessible portions of each vessel. Bilateral testing is considered an integral part of a complete examination. Limited examinations for reoccurring indications may be performed  as noted.  Right Venous Findings: +---------+---------------+---------+-----------+----------+---------+          CompressibilityPhasicitySpontaneityPropertiesSummary   +---------+---------------+---------+-----------+----------+---------+ CFV      Full           No                            pulsatile +---------+---------------+---------+-----------+----------+---------+ SFJ      Full                                                   +---------+---------------+---------+-----------+----------+---------+ FV Prox  None                                         Acute      +---------+---------------+---------+-----------+----------+---------+ FV Mid   None                                         Acute     +---------+---------------+---------+-----------+----------+---------+ FV DistalNone                                         Acute     +---------+---------------+---------+-----------+----------+---------+ PFV      Full                                                   +---------+---------------+---------+-----------+----------+---------+ POP      None           No       No                   Acute     +---------+---------------+---------+-----------+----------+---------+ PTV      None                                         Acute     +---------+---------------+---------+-----------+----------+---------+ PERO     None                                         Acute     +---------+---------------+---------+-----------+----------+---------+  Left Venous Findings: +---------+---------------+---------+-----------+----------+-------+          CompressibilityPhasicitySpontaneityPropertiesSummary +---------+---------------+---------+-----------+----------+-------+ CFV      Full           Yes      Yes                          +---------+---------------+---------+-----------+----------+-------+ SFJ      Full                                                 +---------+---------------+---------+-----------+----------+-------+  FV Prox  Full                                                 +---------+---------------+---------+-----------+----------+-------+ FV Mid   Full                                                 +---------+---------------+---------+-----------+----------+-------+ FV DistalFull                                                 +---------+---------------+---------+-----------+----------+-------+ PFV      Full                                                  +---------+---------------+---------+-----------+----------+-------+ POP      Full           Yes      Yes                          +---------+---------------+---------+-----------+----------+-------+ PTV      Full                                                 +---------+---------------+---------+-----------+----------+-------+    Summary: Right: Findings consistent with acute deep vein thrombosis involving the right femoral vein, right popliteal vein, right posterior tibial vein, and right peroneal vein. No cystic structure found in the popliteal fossa. Pulsatile CFV suggestive of elevated right sided heart pressure Left: There is no evidence of deep vein thrombosis in the lower extremity. No cystic structure found in the popliteal fossa.  *See table(s) above for measurements and observations. Electronically signed by Curt Jews MD on 08/25/2018 at 3:34:14 PM.    Final    Ct Angio Abd/pel W/ And/or W/o  Result Date: 08/25/2018 CLINICAL DATA:  Severe symptomatic aortic stenosis with dyspnea on exertion. Pre-TAVR evaluation. EXAM: CT ANGIOGRAPHY CHEST, ABDOMEN AND PELVIS TECHNIQUE: Multidetector CT imaging through the chest, abdomen and pelvis was performed using the standard protocol during bolus administration of intravenous contrast. Multiplanar reconstructed images and MIPs were obtained and reviewed to evaluate the vascular anatomy. CONTRAST:  152mL ISOVUE-370 IOPAMIDOL (ISOVUE-370) INJECTION 76% COMPARISON:  12/28/2014 chest CT.  06/12/2010 CT abdomen/pelvis. FINDINGS: CTA CHEST FINDINGS Cardiovascular: Normal heart size. No significant pericardial effusion/thickening. Left anterior descending and left circumflex coronary atherosclerosis. Severe thickening and calcification of the aortic valve. Atherosclerotic nonaneurysmal thoracic aorta. Normal caliber pulmonary arteries. Incidentally noted are acute pulmonary emboli within lobar and segmental branches of the right upper and right middle  lobes. Mediastinum/Nodes: No discrete thyroid nodules. Unremarkable esophagus. No pathologically enlarged axillary, mediastinal or hilar lymph nodes. Lungs/Pleura: No pneumothorax. No pleural effusion. Mild centrilobular emphysema with diffuse bronchial wall thickening. Posterior apical right upper lobe 8 x 6 mm solid pulmonary nodule (series 5/image 13) appears slightly increased  from 8 x 4 mm on 12/28/2014 chest CT. Basilar left lower lobe 6 mm solid pulmonary nodule (series 5/image 89) is stable and considered benign. No acute consolidative airspace disease, lung masses or new significant pulmonary nodules. Musculoskeletal: No aggressive appearing focal osseous lesions. Partially visualized right shoulder arthroplasty. Moderate thoracic spondylosis. CTA ABDOMEN AND PELVIS FINDINGS Hepatobiliary: Normal liver with no liver mass. Normal gallbladder with no radiopaque cholelithiasis. No intrahepatic biliary ductal dilatation. CBD diameter 6-7 mm, within normal limits for age. Pancreas: Cystic 2.3 x 1.5 cm pancreatic head lesion (series 4/image 116), increased from 1.2 x 0.7 cm on 06/12/2010 CT. No additional pancreatic lesions. No pancreatic duct dilation. Spleen: Normal size. No mass. Adrenals/Urinary Tract: Normal adrenals. Simple 1.8 cm interpolar left renal cyst. Subcentimeter hypodense renal cortical lesion in the lower left kidney is too small to characterize and requires no follow-up. Multiple parapelvic renal cysts in both kidneys. No hydronephrosis. No additional contour deforming renal lesions. Normal bladder. Stomach/Bowel: Normal non-distended stomach. Normal caliber small bowel with no small bowel wall thickening. Appendix not discretely visualized. No pericecal inflammatory changes. Marked sigmoid diverticulosis, with no large bowel wall thickening or significant pericolonic fat stranding. Vascular/Lymphatic: Atherosclerotic nonaneurysmal abdominal aorta. Patent renal and splenic veins. No  pathologically enlarged lymph nodes in the abdomen or pelvis. Reproductive: Status post hysterectomy, with no abnormal findings at the vaginal cuff. No adnexal mass. Other: No pneumoperitoneum, ascites or focal fluid collection. Musculoskeletal: No aggressive appearing focal osseous lesions. Marked lumbar spondylosis. VASCULAR MEASUREMENTS PERTINENT TO TAVR: AORTA: Minimal Aortic Diameter-12.4 x 11.9 mm (infrarenal abdominal aorta on series 4/image 122) Severity of Aortic Calcification-moderate RIGHT PELVIS: Right Common Iliac Artery - Minimal Diameter-8.9 x 7.8 mm Tortuosity-mild Calcification-moderate Right External Iliac Artery - Minimal Diameter-7.7 x 7.1 mm Tortuosity-moderate Calcification-none Right Common Femoral Artery - Minimal Diameter-7.3 x 6.7 mm Tortuosity-moderate Calcification-mild LEFT PELVIS: Left Common Iliac Artery - Minimal Diameter-9.7 x 8.9 mm Tortuosity-mild Calcification-moderate Left External Iliac Artery - Minimal Diameter-7.1 x 6.9 mm Tortuosity-moderate Calcification-mild Left Common Femoral Artery - Minimal Diameter-7.7 x 7.1 mm Tortuosity-mild Calcification-mild Review of the MIP images confirms the above findings. IMPRESSION: 1. Incidental acute pulmonary emboli within the lobar and segmental branches of the right upper and right middle lobes. 2. Vascular findings and measurements pertinent to potential TAVR procedure, as detailed above. 3. Severe thickening and calcification of the aortic valve, compatible with the reported clinical history of severe aortic stenosis. 4. Aortic Atherosclerosis (ICD10-I70.0) and Emphysema (ICD10-J43.9). 5. Two-vessel coronary atherosclerosis. 6. Posterior apical right upper lobe 8 x 6 mm solid pulmonary nodule appears slightly increased in size since 2016 chest CT, potentially artifactual due to technical scan differences. Recommend attention on follow-up chest CT in 3-6 months. 7. Cystic 2.3 cm pancreatic head lesion is increased in size since 2011 CT  abdomen study. No biliary or pancreatic duct dilation or other high risk features on this routine CT study. MRI abdomen without and with IV contrast is indicated for further characterization, and may be performed as clinically warranted. Alternatively, follow-up pancreas protocol CT abdomen without and with IV contrast may be obtained in 6-12 months. 8. Marked sigmoid diverticulosis. Critical Value/emergent results were called by telephone at the time of interpretation on 08/25/2018 at 10:11 am to Dr. Lauree Chandler , who verbally acknowledged these results. Electronically Signed   By: Ilona Sorrel M.D.   On: 08/25/2018 10:14    Cardiac Studies   Cardiac CT IMPRESSION: 1. Tri leaflet AV with annular area of 436 mm2 suitable  for a 26 mm Sapien 3 valve  2.  Normal aortic root 3.5 cm  3. Optimum angiographic angle for deployment LAO 14 Caudal 19 degrees  4.  Coronary arteries sufficient height above annulus for deployment  5.  No LAA thrombus  ___________  CTA chest abd pelvis IMPRESSION: 1. Incidental acute pulmonary emboli within the lobar and segmental branches of the right upper and right middle lobes. 2. Vascular findings and measurements pertinent to potential TAVR procedure, as detailed above. 3. Severe thickening and calcification of the aortic valve, compatible with the reported clinical history of severe aortic stenosis. 4. Aortic Atherosclerosis (ICD10-I70.0) and Emphysema (ICD10-J43.9). 5. Two-vessel coronary atherosclerosis. 6. Posterior apical right upper lobe 8 x 6 mm solid pulmonary nodule appears slightly increased in size since 2016 chest CT, potentially artifactual due to technical scan differences. Recommend attention on follow-up chest CT in 3-6 months. 7. Cystic 2.3 cm pancreatic head lesion is increased in size since 2011 CT abdomen study. No biliary or pancreatic duct dilation or other high risk features on this routine CT study. MRI  abdomen without and with IV contrast is indicated for further characterization, and may be performed as clinically warranted. Alternatively, follow-up pancreas protocol CT abdomen without and with IV contrast may be obtained in 6-12 months. 8. Marked sigmoid diverticulosis.  ____________  LE dopplers Summary: Right: Findings consistent with acute deep vein thrombosis involving the right femoral vein, right popliteal vein, right posterior tibial vein, and right peroneal vein. No cystic structure found in the popliteal fossa. Pulsatile CFV suggestive of  elevated right sided heart pressure Left: There is no evidence of deep vein thrombosis in the lower extremity. No cystic structure found in the popliteal fossa.   Patient Profile     84 y.o. female with COPD, CAD, HLD and severe aortic stenosis who is undergoing workup for TAVR and was admitted on 08/24/18 for her pre-tAVR CT scans. She had a bad reaction post cath from the contrast dye so we admitted her for the scans to do pre and post treatment for contrast allergy.   Assessment & Plan    1. Severe aortic stenosis: Workup for TAVR underway. Pre-TAVR CT scans completed 08/24/18. Treating with prednisone, benadryl and pepcid post scans given severe reaction to the contrast dye post cath. I will stop these today as she has not had any reaction thus far. Will need to discuss timing of TAVR with team given new finding of acute PE   2. Acute right lung pulmonary embolism: Incidental finding on her pre-TAVR CT chest. The PE is within the lobar and segmental branches of the right upper and middle lobes. LE dopplers showed right leg DVT.  We have started Eliquis 10 mg po BID (full dose given acute PE per pharmacy team). This will be converted to 5mg  BID on 09/01/18. Aspirin discontinued. She was continued on plavix given recent stenting.   3. CAD: Stable. No chest pain. Recent coronary stent placement. Continue plavix. Aspirin stopped given  addition of Eliquis   For questions or updates, please contact [place] Please consult [email or web address] for contact info under      Signed, [name], PA-C  08/26/2018, 8:14 AM    I have personally seen and examined this patient with [name], PA-C I agree with the assessment and plan as outlined above. She was admitted for her pre-TAVR scans but we found an acute PE on her chest CT and right LE DVT. She has been started on Eliquis. She  is confused this am. Will monitor today and plan d/c home tomorrow. Her PE/DVT will delay planning for TAVR.   Lauree Chandler 08/26/2018 8:55 AM

## 2018-08-27 LAB — BASIC METABOLIC PANEL
Anion gap: 4 — ABNORMAL LOW (ref 5–15)
BUN: 17 mg/dL (ref 8–23)
CHLORIDE: 106 mmol/L (ref 98–111)
CO2: 29 mmol/L (ref 22–32)
CREATININE: 0.89 mg/dL (ref 0.44–1.00)
Calcium: 9.1 mg/dL (ref 8.9–10.3)
GFR calc non Af Amer: 55 mL/min — ABNORMAL LOW (ref >60)
Glucose, Bld: 89 mg/dL (ref 70–99)
POTASSIUM: 3.6 mmol/L (ref 3.5–5.1)
Sodium: 139 mmol/L (ref 135–145)

## 2018-08-27 MED ORDER — APIXABAN 5 MG PO TABS: 5.0000 mg | ORAL_TABLET | Freq: Two times a day (BID) | ORAL | 0 refills | Status: DC

## 2018-08-27 MED ORDER — APIXABAN 5 MG PO TABS: 10.0000 mg | ORAL_TABLET | Freq: Two times a day (BID) | ORAL | 0 refills | Status: DC

## 2018-08-27 MED ORDER — GUAIFENESIN-DM 100-10 MG/5ML PO SYRP
5.0000 mL | ORAL_SOLUTION | ORAL | Status: DC | PRN
  Administered 2018-08-27 (×2): 5 mL via ORAL
  Filled 2018-08-27 (×2): qty 5

## 2018-08-27 NOTE — Care Management Note (Addendum)
Case Management Note  Patient Details  Name: Jessica [name] MRN: [id number] Date of Birth: 09/25/1926  Subjective/Objective:  Severe Aortic Stenosis being worked up for TAVR               Action/Plan: Primary Care Provider: [name], [name], MD; has private insurance with Medicare/ AARP; [facility]; CM talked to patient at the bedside for [facility] choices, patient stated "Honey, [name]" CM will continue to follow for progression of care. B Chidiebere Wynn RN,MHA,BSN  1:55 pm - CM talked to patient again with her daughter and grandson at the bedside, pt is now agreeable to [facility] services; [facility] choice offered, daughter chose [facility]; [place] with [facility] called for arrangements.Andris Flurry   Expected Discharge Date:  08/27/18               Expected Discharge Plan:  [place]  Discharge planning Services  CM Consult  Choice offered to:  Patient  HH Arranged:  Patient Refused HH   Status of Service:  In process, will continue to follow  Sherrilyn Rist [phone] 08/27/2018, 10:17 AM

## 2018-08-27 NOTE — Plan of Care (Signed)
  Problem: Education: Goal: Knowledge of General Education information will improve Description Including pain rating scale, medication(s)/side effects and non-pharmacologic comfort measures 08/27/2018 0154 by Theador Hawthorne, RN Outcome: Progressing 08/27/2018 0154 by Theador Hawthorne, RN Outcome: Progressing   Problem: Health Behavior/Discharge Planning: Goal: Ability to manage health-related needs will improve 08/27/2018 0154 by Theador Hawthorne, RN Outcome: Progressing 08/27/2018 0154 by Theador Hawthorne, RN Outcome: Progressing   Problem: Clinical Measurements: Goal: Ability to maintain clinical measurements within normal limits will improve 08/27/2018 0154 by Theador Hawthorne, RN Outcome: Progressing 08/27/2018 0154 by Theador Hawthorne, RN Outcome: Progressing   Problem: Clinical Measurements: Goal: Diagnostic test results will improve 08/27/2018 0154 by Theador Hawthorne, RN Outcome: Progressing 08/27/2018 0154 by Theador Hawthorne, RN Outcome: Progressing   Problem: Safety: Goal: Ability to remain free from injury will improve 08/27/2018 0154 by Theador Hawthorne, RN Outcome: Progressing 08/27/2018 0154 by Theador Hawthorne, RN Outcome: Progressing

## 2018-08-27 NOTE — Care Management (Signed)
WHO SPOKE WITH/ COMPANY/ PHONE NUMBER: [employer] [phone]  MEDICATION/ DOSE: Eliquis 5mg  (4 times daily)  AUTH REQUIRED? IF SO, PHONE NUMBER: No auth required  COVERED?: yes  CO-PAY FOR 30 DAY SUPPLY AT [facility]AIL PHARMACY ([facility]): $45  CO-PAY FOR 90 DAY SUPPLY AT MAIL-ORDER PHARMACY: $254.80  ALTERNATIVES IF MEDICATION NOT COVERED:   ADDITIONAL NOTES:

## 2018-08-27 NOTE — Consult Note (Signed)
   [facility] CM Inpatient Consult   08/27/2018  Lonie Rummell [facility] [birth year] [id number]  Patient screened for [place] Management services for 2 hospital readmissions in past 6 months. Went to bedside to offer and explain [facility] Care Management program to patient.  Patient verified her name and date of birth. Patient states that she will be discharged home today and expects her daughter to come at 12:30 pm for transport home. Attempted to speak with patient about [place] Management Services. Patient was hard of hearing. Patient states, "My daughter handles all of that stuff." Notified patient that will return later to speak with her daughter.  1:16 pm Spoke with patient's daughter, Erin Hearing, about [facility] Care Management Services. Ms. Vaughan Basta states that patient lives with her and there are other family members to help care for the patient. Ms. Vaughan Basta endorses that she may need help and thinks that home health would be a good idea when she goes home and states the patient gets confused for several days after coming home and it takes some time for her to get reoriented to home after a hospital admission.   Patient's daughter signed consent for [place] [facility]. Referral made to community care manager for complex case management follow up.  Informed patient and daughter that [facility] does not interfere with or replace any services arranged by inpatient care management staff.  Netta Cedars, MSN, RN [facility] Toll free office [phone]

## 2018-08-27 NOTE — Progress Notes (Addendum)
Benefit check in progress for Eliquis; coupon card given to patient with explanation of usage. Mindi Slicker [facility] ([facility]) [phone]

## 2018-08-27 NOTE — Plan of Care (Signed)
  Problem: Education: Goal: Knowledge of General Education information will improve Description Including pain rating scale, medication(s)/side effects and non-pharmacologic comfort measures Outcome: Progressing   Problem: Health Behavior/Discharge Planning: Goal: Ability to manage health-related needs will improve Outcome: Progressing   Problem: Clinical Measurements: Goal: Will remain free from infection Outcome: Progressing   Problem: Clinical Measurements: Goal: Diagnostic test results will improve Outcome: Progressing   Problem: Clinical Measurements: Goal: Cardiovascular complication will be avoided Outcome: Progressing   Problem: Coping: Goal: Level of anxiety will decrease Outcome: Progressing   Problem: Pain Managment: Goal: General experience of comfort will improve Outcome: Progressing

## 2018-08-30 ENCOUNTER — Telehealth: Payer: Self-pay | Admitting: Family Medicine

## 2018-08-30 NOTE — Telephone Encounter (Signed)
FYI

## 2018-08-30 NOTE — Telephone Encounter (Signed)
Copied from [place] [id number]. Topic: Quick Communication - See Telephone Encounter >> Aug 30, 2018  3:56 PM Bea Graff, NT wrote: CRM for notification. See Telephone encounter for: 08/30/18. Darlina Guys a nurse with [facility] calling to let Dr. [name] know they will be going out to start home health with the patient on 09/01/18. CB#: [phone]

## 2018-09-01 ENCOUNTER — Other Ambulatory Visit: Payer: Self-pay

## 2018-09-01 NOTE — Patient Outreach (Signed)
[place] [facility]) Care Management  09/01/2018  Cheyla Duchemin [facility] Feb 11, 1926 [id number]   Referral received 08/27/18 from hospital liaison for community care management. Primary care listed as completing transition of care.  84 year old having workup for TAVR hospitalized 08/24/18-08/27/18  For her for the scans to do pre and post treatment for contrast allergy; incidental acute pulmonary embolism noted this admission. History of severe aortic stenosis, CAD, HLD, generalized anxiety disorder, COPD.  Per instructions: Call client's daughter, Erin Hearing. RNCM called to follow up. No answer from home or mobile number.  Plan: send outreach letter and follow up within 3-4 business days.  Thea Silversmith, RN, MSN, [place] Coordinator Cell: [phone]

## 2018-09-05 NOTE — Progress Notes (Signed)
HPI:   [name] is a 84 y.o. female, who is here today with her daughter to follow on recent hospitalization. She was admitted on 08/24/18 and discharged on 08/27/18.  [facility] consultation on 08/27/2018.  Last seen in the office 06/30/18 for follow-up.  She was admitted to the hospital on 08/24/2017 because incidental acute pulmonary embolism right lung and right DVT.  She was having work-up done before TAVR procedure.   Currently she is on Eliquis 5 mg twice daily. She has tolerated medication well. No changes in rest of her medications.  Right and left heart cath on 07/07/2018 severe showed severe, heavily calcified (80%) stenosis in the proximal LAD that required revascularization. On 07/23/2017 she underwent coronary atherectomy with stent placement to LAD.   Lab Results  Component Value Date   CREATININE 0.89 08/27/2018   BUN 17 08/27/2018   NA 139 08/27/2018   K 3.6 08/27/2018   CL 106 08/27/2018   CO2 29 08/27/2018   Home health was arranged, daughter missed calls and has not called back to re-schedule.  She was using a walker at the time of her discharge and now is back to her cane. According to her daughter, she is close to her baseline. Her daughter also thinks her exertional dyspnea is slightly better. She has not complained of chest pain. No fever or change in appetite.   Anxiety and insomnia: Her daughter would like to increase the dose of alprazolam, currently she is taking 0.25 mg at bedtime and occasionally an extra tablet if needed. She is also on trazodone 25 to 50 mg at bedtime.  According to daughter, for the past couple weeks she has needed 2 tablets instead of 1 tablet at bedtime due to worsening anxiety.  So she would like Xanax dose to be increased. She is also on Lexapro 20 mg daily.  She has tolerated medication well, no side effects reported. Alprazolam 0.5 mg helps her sleep through the night.  Review of Systems  Constitutional:  Positive for activity change and fatigue. Negative for appetite change and fever.  HENT: Negative for mouth sores, nosebleeds and trouble swallowing.   Eyes: Negative for redness and visual disturbance.  Respiratory: Positive for cough and shortness of breath. Negative for wheezing.   Cardiovascular: Negative for chest pain, palpitations and leg swelling.  Gastrointestinal: Negative for abdominal pain, nausea and vomiting.       Negative for changes in bowel habits.  Genitourinary: Negative for decreased urine volume, dysuria and hematuria.  Musculoskeletal: Positive for arthralgias, gait problem and myalgias.  Skin: Negative for rash and wound.  Neurological: Negative for syncope, weakness, numbness and headaches.  Psychiatric/Behavioral: Positive for sleep disturbance. Negative for confusion. The patient is nervous/anxious.       Current Outpatient Medications on File Prior to Visit  Medication Sig Dispense Refill  . albuterol (PROVENTIL) (2.5 MG/3ML) 0.083% nebulizer solution Take 3 mLs (2.5 mg total) by nebulization every 4 (four) hours as needed for wheezing or shortness of breath. DX: COPD J44.9 75 mL 12  . albuterol (VENTOLIN HFA) 108 (90 Base) MCG/ACT inhaler Inhale 2 puffs into the lungs every 6 (six) hours as needed for wheezing or shortness of breath. 18 Inhaler 2  . apixaban (ELIQUIS) 5 MG TABS tablet Take 1 tablet (5 mg total) by mouth 2 (two) times daily. 180 tablet 0  . arformoterol (BROVANA) 15 MCG/2ML NEBU Take 2 mLs (15 mcg total) by nebulization 2 (two) times daily. DX:  COPD J44.9 (Patient taking differently: Take 15 mcg by nebulization 2 (two) times daily. ) 120 mL 12  . budesonide (PULMICORT) 0.5 MG/2ML nebulizer solution Take 2 mLs (0.5 mg total) by nebulization 2 (two) times daily. DX: COPD J44.9 120 mL 12  . Cholecalciferol (VITAMIN D) 1000 UNITS capsule Take 1,000 Units by mouth daily.      . clopidogrel (PLAVIX) 75 MG tablet Take 1 tablet (75 mg total) by mouth  daily. 30 tablet 11  . escitalopram (LEXAPRO) 20 MG tablet Take 1 tablet (20 mg total) by mouth daily. 90 tablet 1  . multivitamin-lutein (OCUVITE-LUTEIN) CAPS capsule Take 1 capsule by mouth daily.    . simvastatin (ZOCOR) 20 MG tablet Take 1 tablet (20 mg total) by mouth at bedtime. 90 tablet 3  . traZODone (DESYREL) 50 MG tablet Take 0.5-1 tablets (25-50 mg total) by mouth at bedtime as needed for sleep. 90 tablet 0   No current facility-administered medications on file prior to visit.      Past Medical History:  Diagnosis Date  . Anxiety   . congenital nystagmus   . COPD (chronic obstructive pulmonary disease) ([place])   . Coronary artery disease   . DJD (degenerative joint disease)   . Fibromyalgia   . Low back pain syndrome   . Memory loss   . Other and unspecified hyperlipidemia   . Severe aortic stenosis   . Venous insufficiency    Allergies  Allergen Reactions  . Contrast Media [Iodinated Diagnostic Agents] Itching, Rash and Other (See Comments)    Hypotension, Skin turns red like a sunburn  . Iohexol Itching, Rash and Other (See Comments)    Hypotension, Skin turns red like a sunburn    Social History   Socioeconomic History  . Marital status: Widowed    Spouse name: Not on file  . Number of children: 2  . Years of education: Not on file  . Highest education level: Not on file  Occupational History  . Occupation: [employer]  Social Needs  . Financial resource strain: Not on file  . Food insecurity:    Worry: Not on file    Inability: Not on file  . Transportation needs:    Medical: Not on file    Non-medical: Not on file  Tobacco Use  . Smoking status: Former Smoker    Packs/day: 2.00    Years: 30.00    Pack years: 60.00    Types: Cigarettes    Last attempt to quit: 10/28/1991    Years since quitting: 26.8  . Smokeless tobacco: Never Used  Substance and Sexual Activity  . Alcohol use: Yes    Comment: 1 drink per night  . Drug use:  No  . Sexual activity: Not on file  Lifestyle  . Physical activity:    Days per week: Not on file    Minutes per session: Not on file  . Stress: Not on file  Relationships  . Social connections:    Talks on phone: Not on file    Gets together: Not on file    Attends religious service: Not on file    Active member of club or organization: Not on file    Attends meetings of clubs or organizations: Not on file    Relationship status: Not on file  Other Topics Concern  . Not on file  Social History Narrative   1 sibling alive age 28   1 sibling alive age 57   1  sibling deceased age 61   1 sibling alive age 74    Vitals:   09/06/18 0926  BP: 120/78  Pulse: 82  Resp: 16  Temp: 97.8 F (36.6 C)  SpO2: 94%   Body mass index is 21.34 kg/m.   Physical Exam  Nursing note and vitals reviewed. Constitutional: She is oriented to person, place, and time. She appears well-developed and well-nourished. No distress.  HENT:  Head: Normocephalic and atraumatic.  Mouth/Throat: Oropharynx is clear and moist and mucous membranes are normal.  Eyes: Pupils are equal, round, and reactive to light. Conjunctivae are normal.  Cardiovascular: Normal rate and regular rhythm.  Murmur (SEM III/VI RUSB>LUSB) heard. DP pulses present bilateral. Varicose vain,tortuise R>L.  Respiratory: Effort normal and breath sounds normal. No respiratory distress.  GI: Soft. She exhibits no mass. There is no tenderness.  Musculoskeletal: She exhibits no edema.  Lymphadenopathy:    She has no cervical adenopathy.  Neurological: She is alert and oriented to person, place, and time. She has normal strength. No cranial nerve deficit. Gait abnormal.  Unstable gait assisted by a cane.  Skin: Skin is warm. No rash noted. No erythema.  Psychiatric: She has a normal mood and affect.  Well groomed, good eye contact.    ASSESSMENT AND PLAN:  [name] was seen today for hospitalization follow-up.  Diagnoses and  all orders for this visit:   Insomnia Alprazolam increased from 0.25mg  to 0.5 mg at bedtime. No changes in trazodone 50 mg 0.5-1 tablet daily at bedtime.  She has been on Xanax for many years. We discussed side effects of benzodiazepines. Good sleep hygiene.    Generalized anxiety disorder Probably aggravated by recent health issues. Alprazolam increased from 0.25mg  to 0.5 mg at bedtime as needed. No changes in Lexapro 20 mg. Instructed about warning signs. Follow-up in 4 months, before if needed.  Pulmonary embolism ([place]) Found incidentally while having work-up for TAVR procedure. We discussed some side effects of Eliquis, she will continue 5 mg twice daily to complete 3 months. Recommend daughter to postpone any invasive procedure, including intraocular injections until she is off of Eliquis. Instructed about warning signs.  COPD (chronic obstructive pulmonary disease) ([place]) Symptoms have improved since she started Pulmicort neb treatments twice daily.  No changes in current management. She is following with pulmonologist.  Severe aortic stenosis She will continue following with cardiologist, next appt 09/30/18.      Jessica Galiano G. [name], MD  [facility]. [place] office.

## 2018-09-06 ENCOUNTER — Ambulatory Visit (INDEPENDENT_AMBULATORY_CARE_PROVIDER_SITE_OTHER): Payer: Medicare Other | Admitting: Family Medicine

## 2018-09-06 ENCOUNTER — Encounter: Payer: Self-pay | Admitting: Family Medicine

## 2018-09-06 ENCOUNTER — Other Ambulatory Visit: Payer: Self-pay

## 2018-09-06 VITALS — BP 120/78 | HR 82 | Temp 97.8°F | Resp 16 | Ht 65.0 in | Wt 128.2 lb

## 2018-09-06 DIAGNOSIS — I350 Nonrheumatic aortic (valve) stenosis: Secondary | ICD-10-CM | POA: Diagnosis not present

## 2018-09-06 DIAGNOSIS — I2699 Other pulmonary embolism without acute cor pulmonale: Secondary | ICD-10-CM

## 2018-09-06 DIAGNOSIS — G4700 Insomnia, unspecified: Secondary | ICD-10-CM

## 2018-09-06 DIAGNOSIS — F411 Generalized anxiety disorder: Secondary | ICD-10-CM | POA: Diagnosis not present

## 2018-09-06 DIAGNOSIS — J449 Chronic obstructive pulmonary disease, unspecified: Secondary | ICD-10-CM | POA: Diagnosis not present

## 2018-09-06 MED ORDER — ALPRAZOLAM 0.5 MG PO TABS: 0.5000 mg | ORAL_TABLET | Freq: Every evening | ORAL | 2 refills | Status: DC | PRN

## 2018-09-06 MED ORDER — ALPRAZOLAM 0.25 MG PO TABS: 0.5000 mg | ORAL_TABLET | Freq: Every day | ORAL | 3 refills | Status: DC

## 2018-09-06 NOTE — Assessment & Plan Note (Signed)
Found incidentally while having work-up for TAVR procedure. We discussed some side effects of Eliquis, she will continue 5 mg twice daily to complete 3 months. Recommend daughter to postpone any invasive procedure, including intraocular injections until she is off of Eliquis. Instructed about warning signs.

## 2018-09-06 NOTE — Assessment & Plan Note (Addendum)
Alprazolam increased from 0.25mg  to 0.5 mg at bedtime. No changes in trazodone 50 mg 0.5-1 tablet daily at bedtime.  She has been on Xanax for many years. We discussed side effects of benzodiazepines. Good sleep hygiene.

## 2018-09-06 NOTE — Patient Instructions (Addendum)
A few things to remember from today's visit:   Acute pulmonary embolism, unspecified pulmonary embolism type, unspecified whether acute cor pulmonale present (HCC)  Generalized anxiety disorder - Plan: ALPRAZolam (XANAX) 0.25 MG tablet  Chronic obstructive pulmonary disease, unspecified COPD type ([place])   Please be sure medication list is accurate. If a new problem present, please set up appointment sooner than planned today.

## 2018-09-06 NOTE — Patient Outreach (Signed)
[place] [facility]) Care Management  09/06/2018  Jessica [name] [facility] [birth year] [id number]   Referral received 08/27/18 from hospital liaison for community care management. Primary care listed as completing transition of care.  84 year old having workup for TAVR hospitalized 08/24/18-08/27/18  For her for the scans to do pre and post treatment for contrast allergy; incidental acute pulmonary embolism noted this admission. History of severe aortic stenosis, CAD, HLD, generalized anxiety disorder, COPD.  RNCM called home number and no answer/unable to leave message. RNCM called [phone]. No answer. HIPPA compliant message left. 2nd attempt. Outreach letter sent 09/01/18.  Plan: RNCM will await call and continue to attempt to reach client.  Thea Silversmith, RN, MSN, [place] Coordinator Cell: [phone]

## 2018-09-06 NOTE — Assessment & Plan Note (Signed)
She will continue following with cardiologist, next appt 09/30/18.

## 2018-09-06 NOTE — Assessment & Plan Note (Signed)
Probably aggravated by recent health issues. Alprazolam increased from 0.25mg  to 0.5 mg at bedtime as needed. No changes in Lexapro 20 mg. Instructed about warning signs. Follow-up in 4 months, before if needed.

## 2018-09-06 NOTE — Assessment & Plan Note (Signed)
Symptoms have improved since she started Pulmicort neb treatments twice daily.  No changes in current management. She is following with pulmonologist.

## 2018-09-08 ENCOUNTER — Other Ambulatory Visit: Payer: Self-pay

## 2018-09-08 NOTE — Patient Outreach (Signed)
[place] [facility]) Care Management  09/08/2018  Jessica [name] [facility] Sep 20, 1926 [id number]  Referral received 08/27/18 from hospital liaison for community care management. Primary care listed as completing transition of care.  84 year old having workup for TAVR hospitalized 08/24/18-08/27/18  For her for the scans to do pre and post treatment for contrast allergy; incidental acute pulmonary embolism noted this admission. History of severe aortic stenosis, CAD, HLD, generalized anxiety disorder, COPD.  RNCM called to introduce self and engage into the program- Spoke with client's daughter Erin Hearing (caregiver/daughter on consent). Two patient identifiers confirmed. She reports client is doing well, "I don't think she needs this at this time", She reports she is keeping up with everything. She denies any medication needs or social work needs. She reports client has seen her primary care this past Monday and has a follow up in December. She is declining services at this time.  RNCM provided 24 hour nurse advice line and encouraged to call as needed.  Plan: RNCM will send [facility] information. RNCM will not open case.  Thea Silversmith, RN, MSN, [place] Coordinator Cell: [phone]

## 2018-09-09 ENCOUNTER — Encounter: Payer: Self-pay | Admitting: Physician Assistant

## 2018-09-30 ENCOUNTER — Ambulatory Visit: Payer: Self-pay | Admitting: Physician Assistant

## 2018-10-05 NOTE — Progress Notes (Signed)
[facility] [place] [facility]                                       Cardiology Office Note    Date:  10/06/2018   ID:  Jessica [name], DOB [birth year], MRN [id number]  PCP:  [name], [name], MD  Cardiologist:  [name], MD   CC:  Follow up of severe AS.  History of Present Illness:  Jessica [name] is a 84 y.o. female with a history of COPD, CAD, HLD, severe aortic stenosis and recently diagnosed pulmonary embolism who presents to clinic for follow up.   [name] severe aortic stenosis. During her pre op work upfor TAVR,she had a right and left heart cath on 07/07/18 which showed a severe, heavily calcified (80%) stenosis in the proximal LADthat required revascularization. The patient was discharged home but called inthe next day with complaints of an intensely pruritic rash. Steroids were called into her pharmacy. She returned back to [facility] on 07/23/18 for coronary atherectomy with stent placement to mLAD. Given previous allergy she was pre-medicated with steroids and benadryl. Despite pre medication she still had a delayed contrast reaction greater than 12 hours after PCI resulting in pruritus and generalized erythema and associated hypotensionw/ SBPinto the 90s. Due to her severe reaction is was decided to premedicate her before and after scans and keep inpatient. She did well with CT scans and had no reaction with pre/post regimen. Scan did show an incidental finding of an acute right lung PE. LE dopplers showed right leg DVT. She was started on Eliquis and continued on plavix given recent stent. Aspirin was discontinued. Plan is for 6 months of anticoagulation before possible TAVR.  Today she presents to clinic for follow up. She has had a lot of improvement in her shortness of breath since being treated for PEs. Still has significant shortness of breath with exertion. No chest pain. She has mild dizziness when she stands but no syncope.  No blood in stool or urine. No LE edema. Has a cellulitis on her left arm where she picks at her skin. This is being treated by her PCP with Abx.  Past Medical History:  Diagnosis Date  . Anxiety   . congenital nystagmus   . COPD (chronic obstructive pulmonary disease) ([place])   . Coronary artery disease   . DJD (degenerative joint disease)   . Fibromyalgia   . Low back pain syndrome   . Memory loss   . Other and unspecified hyperlipidemia   . Severe aortic stenosis   . Venous insufficiency     Past Surgical History:  Procedure Laterality Date  . ABDOMINAL HYSTERECTOMY    . ANTERIOR CERVICAL DISCECTOMY    . CARPAL TUNNEL RELEASE  12/2011   right arm  . CATARACT EXTRACTION    . CORONARY ATHERECTOMY  07/23/2018   PTCA/orbital atherectomy/DES x 1 proximal to mid LAD  . CORONARY ATHERECTOMY N/A 07/23/2018   Procedure: CORONARY ATHERECTOMY;  Surgeon: Burnell Blanks, MD;  Location: [facility];  Service: Cardiovascular;  Laterality: N/A;  . CORONARY STENT INTERVENTION    . CORONARY STENT INTERVENTION N/A 07/23/2018   Procedure: CORONARY STENT INTERVENTION;  Surgeon: Burnell Blanks, MD;  Location: [facility];  Service: Cardiovascular;  Laterality: N/A;  . LUMBAR LAMINECTOMY    . right knee arthroscopy    .  right shoulder replacement  04/2009  . right shoulder surgery  2008   Dr. Noemi Chapel  . RIGHT/LEFT HEART CATH AND CORONARY ANGIOGRAPHY N/A 07/07/2018   Procedure: RIGHT/LEFT HEART CATH AND CORONARY ANGIOGRAPHY;  Surgeon: Burnell Blanks, MD;  Location: [facility];  Service: Cardiovascular;  Laterality: N/A;  . ULTRASOUND GUIDANCE FOR VASCULAR ACCESS  07/07/2018   Procedure: Ultrasound Guidance For Vascular Access;  Surgeon: Burnell Blanks, MD;  Location: [facility];  Service: Cardiovascular;;    Current Medications: Outpatient Medications Prior to Visit  Medication Sig Dispense Refill  . albuterol (PROVENTIL) (2.5 MG/3ML)  0.083% nebulizer solution Take 3 mLs (2.5 mg total) by nebulization every 4 (four) hours as needed for wheezing or shortness of breath. DX: COPD J44.9 75 mL 12  . albuterol (VENTOLIN HFA) 108 (90 Base) MCG/ACT inhaler Inhale 2 puffs into the lungs every 6 (six) hours as needed for wheezing or shortness of breath. 18 Inhaler 2  . ALPRAZolam (XANAX) 0.5 MG tablet Take 1 tablet (0.5 mg total) by mouth at bedtime as needed for anxiety. 30 tablet 2  . apixaban (ELIQUIS) 5 MG TABS tablet Take 1 tablet (5 mg total) by mouth 2 (two) times daily. 180 tablet 0  . arformoterol (BROVANA) 15 MCG/2ML NEBU Take 2 mLs (15 mcg total) by nebulization 2 (two) times daily. DX: COPD J44.9 (Patient taking differently: Take 15 mcg by nebulization 2 (two) times daily. ) 120 mL 12  . budesonide (PULMICORT) 0.5 MG/2ML nebulizer solution Take 2 mLs (0.5 mg total) by nebulization 2 (two) times daily. DX: COPD J44.9 120 mL 12  . Cholecalciferol (VITAMIN D) 1000 UNITS capsule Take 1,000 Units by mouth daily.      . clopidogrel (PLAVIX) 75 MG tablet Take 1 tablet (75 mg total) by mouth daily. 30 tablet 11  . doxycycline (VIBRA-TABS) 100 MG tablet Take 1 tablet (100 mg total) by mouth 2 (two) times daily for 7 days. 14 tablet 0  . escitalopram (LEXAPRO) 20 MG tablet Take 1 tablet (20 mg total) by mouth daily. 90 tablet 1  . multivitamin-lutein (OCUVITE-LUTEIN) CAPS capsule Take 1 capsule by mouth daily.    . simvastatin (ZOCOR) 20 MG tablet Take 1 tablet (20 mg total) by mouth at bedtime. 90 tablet 3  . traZODone (DESYREL) 50 MG tablet Take 0.5-1 tablets (25-50 mg total) by mouth at bedtime as needed for sleep. 90 tablet 0  . triamcinolone cream (KENALOG) 0.1 % Apply 1 application topically 2 (two) times daily. 30 g 0   No facility-administered medications prior to visit.      Allergies:   Contrast media [iodinated diagnostic agents] and Iohexol   Social History   Socioeconomic History  . Marital status: Widowed    Spouse  name: Not on file  . Number of children: 2  . Years of education: Not on file  . Highest education level: Not on file  Occupational History  . Occupation: [employer]  Social Needs  . Financial resource strain: Not on file  . Food insecurity:    Worry: Not on file    Inability: Not on file  . Transportation needs:    Medical: Not on file    Non-medical: Not on file  Tobacco Use  . Smoking status: Former Smoker    Packs/day: 2.00    Years: 30.00    Pack years: 60.00    Types: Cigarettes    Last attempt to quit: 10/28/1991    Years  since quitting: 26.9  . Smokeless tobacco: Never Used  Substance and Sexual Activity  . Alcohol use: Yes    Comment: 1 drink per night  . Drug use: No  . Sexual activity: Not on file  Lifestyle  . Physical activity:    Days per week: Not on file    Minutes per session: Not on file  . Stress: Not on file  Relationships  . Social connections:    Talks on phone: Not on file    Gets together: Not on file    Attends religious service: Not on file    Active member of club or organization: Not on file    Attends meetings of clubs or organizations: Not on file    Relationship status: Not on file  Other Topics Concern  . Not on file  Social History Narrative   1 sibling alive age 44   1 sibling alive age 13   1 sibling deceased age 59   1 sibling alive age 49     Family History:  The patient's family history includes Other in her father and mother.      ROS:   Please see the history of present illness.    ROS All other systems reviewed and are negative.   PHYSICAL EXAM:   VS:  BP 122/80   Pulse 97   Ht 5\' 5"  (1.651 m)   Wt 132 lb (59.9 kg)   SpO2 95%   BMI 21.97 kg/m    GEN: elderly white female HEENT: normal Neck: no JVD or masses Cardiac: RRR; harsh 4/6 SEM @RUSB , no rubs, or gallops,no edema  Respiratory:  clear to auscultation bilaterally, normal work of breathing GI: soft, nontender, nondistended, + BS MS:  no deformity or atrophy Skin: warm and dry, no rash. Left arm erythematous with chronic scabbed over sores Neuro:  Alert and Oriented x 3, Strength and sensation are intact Psych: euthymic mood, full affect    Wt Readings from Last 3 Encounters:  10/06/18 132 lb (59.9 kg)  10/06/18 132 lb 6 oz (60 kg)  09/06/18 128 lb 4 oz (58.2 kg)      Studies/Labs Reviewed:   EKG:  EKG is NOT ordered today.   Recent Labs: 07/24/2018: B Natriuretic Peptide 623.0 08/25/2018: ALT 11 08/26/2018: Hemoglobin 10.6; Platelets 264 08/27/2018: BUN 17; Creatinine, Ser 0.89; Potassium 3.6; Sodium 139   Lipid Panel    Component Value Date/Time   CHOL 125 05/27/2017 1052   TRIG 52.0 05/27/2017 1052   HDL 54.70 05/27/2017 1052   CHOLHDL 2 05/27/2017 1052   VLDL 10.4 05/27/2017 1052   LDLCALC 60 05/27/2017 1052   LDLDIRECT 150.0 06/12/2008 0931    Additional studies/ records that were reviewed today include:   Echo 06/10/18 Study Conclusions - Left ventricle: The cavity size was normal. There was moderate   concentric hypertrophy. Systolic function was normal. The   estimated ejection fraction was in the range of 55% to 60%. Wall   motion was normal; there were no regional wall motion   abnormalities. Doppler parameters are consistent with abnormal   left ventricular relaxation (grade 1 diastolic dysfunction). - Aortic valve: There was severe stenosis. Mean gradient (S): 49 mm   Hg. VTI ratio of LVOT to aortic valve: 0.15. Valve area (VTI):   0.47 cm^2. - Mitral valve: Calcified annulus. - Pulmonary arteries: Systolic pressure was mildly to moderately   increased. PA peak pressure: 45 mm Hg (S). Impressions: - Progression of severe aortic  stenosis.  _______________   Cardiac CT IMPRESSION: 1. Tri leaflet AV with annular area of 436 mm2 suitable for a 26 mm Sapien 3 valve  2. Normal aortic root 3.5 cm  3. Optimum angiographic angle for deployment LAO 14 Caudal 19 degrees  4.  Coronary arteries sufficient height above annulus for deployment  5. No LAA thrombus  ___________  CTA chest abd pelvis IMPRESSION: 1. Incidental acute pulmonary emboli within the lobar and segmental branches of the right upper and right middle lobes. 2. Vascular findings and measurements pertinent to potential TAVR procedure, as detailed above. 3. Severe thickening and calcification of the aortic valve, compatible with the reported clinical history of severe aortic stenosis. 4. Aortic Atherosclerosis (ICD10-I70.0) and Emphysema (ICD10-J43.9). 5. Two-vessel coronary atherosclerosis. 6. Posterior apical right upper lobe 8 x 6 mm solid pulmonary nodule appears slightly increased in size since 2016 chest CT, potentially artifactual due to technical scan differences. Recommend attention on follow-up chest CT in 3-6 months. 7. Cystic 2.3 cm pancreatic head lesion is increased in size since 2011 CT abdomen study. No biliary or pancreatic duct dilation or other high risk features on this routine CT study. MRI abdomen without and with IV contrast is indicated for further characterization, and may be performed as clinically warranted. Alternatively, follow-up pancreas protocol CT abdomen without and with IV contrast may be obtained in 6-12 months. 8. Marked sigmoid diverticulosis.   ASSESSMENT & PLAN:   Severe aortic stenosis: planning to proceed with TAVR after 6 months of oral anticoagulation to treat pulmonary embolus. I will see her back in mid April with an updated echo. Will have her seen back by Dr. [name] in between now and then   Right lung pulmonary embolism: continue Eliquis 5mg  BID. Breathing has improved since being started on oral anticoagulation  CAD: s/p successful PTCA/orbital atherectomy/DES x 1 proximal to mid LAD on 07/23/18. Continue plavix. Aspirin was discontinued when she was started on Eliquis. Continue statin.    Medication Adjustments/Labs and Tests  Ordered: Current medicines are reviewed at length with the patient today.  Concerns regarding medicines are outlined above.  Medication changes, Labs and Tests ordered today are listed in the Patient Instructions below. Patient Instructions  Medication Instructions:  Your provider recommends that you continue on your current medications as directed. Please refer to the Current Medication list given to you today.    Labwork: None  Testing/Procedures: Your provider has requested that you have an echocardiogram. Echocardiography is a painless test that uses sound waves to create images of your heart. It provides your doctor with information about the size and shape of your heart and how well your heart's chambers and valves are working. This procedure takes approximately one hour. There are no restrictions for this procedure. Your echo is scheduled February 10, 2019 at 1:00PM. Please arrive by 12:30PM for this appointment.  Follow-Up: You have an appointment scheduled with Nell Range, PA immediately following your echocardiogram on February 10, 2019.  Your provider recommends that you schedule a follow-up appointment with Dr. [name] in February.     Signed, [name], PA-C  10/06/2018 2:59 PM    [facility] [address], [place], [place]  [address] Phone: [phone]; Fax: [phone]

## 2018-10-06 ENCOUNTER — Encounter: Payer: Self-pay | Admitting: Physician Assistant

## 2018-10-06 ENCOUNTER — Ambulatory Visit (INDEPENDENT_AMBULATORY_CARE_PROVIDER_SITE_OTHER): Payer: Medicare Other | Admitting: Physician Assistant

## 2018-10-06 ENCOUNTER — Encounter: Payer: Self-pay | Admitting: Family Medicine

## 2018-10-06 ENCOUNTER — Ambulatory Visit (INDEPENDENT_AMBULATORY_CARE_PROVIDER_SITE_OTHER): Payer: Medicare Other | Admitting: Family Medicine

## 2018-10-06 VITALS — BP 116/80 | HR 93 | Temp 97.5°F | Resp 16 | Ht 65.0 in | Wt 132.4 lb

## 2018-10-06 VITALS — BP 122/80 | HR 97 | Ht 65.0 in | Wt 132.0 lb

## 2018-10-06 DIAGNOSIS — I350 Nonrheumatic aortic (valve) stenosis: Secondary | ICD-10-CM

## 2018-10-06 DIAGNOSIS — I2510 Atherosclerotic heart disease of native coronary artery without angina pectoris: Secondary | ICD-10-CM

## 2018-10-06 DIAGNOSIS — L03114 Cellulitis of left upper limb: Secondary | ICD-10-CM | POA: Diagnosis not present

## 2018-10-06 DIAGNOSIS — I2699 Other pulmonary embolism without acute cor pulmonale: Secondary | ICD-10-CM | POA: Diagnosis not present

## 2018-10-06 DIAGNOSIS — S51811A Laceration without foreign body of right forearm, initial encounter: Secondary | ICD-10-CM | POA: Diagnosis not present

## 2018-10-06 DIAGNOSIS — F411 Generalized anxiety disorder: Secondary | ICD-10-CM

## 2018-10-06 DIAGNOSIS — L299 Pruritus, unspecified: Secondary | ICD-10-CM

## 2018-10-06 DIAGNOSIS — G4700 Insomnia, unspecified: Secondary | ICD-10-CM

## 2018-10-06 MED ORDER — ALPRAZOLAM 0.5 MG PO TABS: 0.5000 mg | ORAL_TABLET | Freq: Every evening | ORAL | 2 refills | Status: DC | PRN

## 2018-10-06 MED ORDER — TRIAMCINOLONE ACETONIDE 0.1 % EX CREA: 1.0000 "application " | TOPICAL_CREAM | Freq: Two times a day (BID) | CUTANEOUS | 0 refills | Status: DC

## 2018-10-06 MED ORDER — DOXYCYCLINE HYCLATE 100 MG PO TABS: 100.0000 mg | ORAL_TABLET | Freq: Two times a day (BID) | ORAL | 0 refills | Status: AC

## 2018-10-06 NOTE — Patient Instructions (Signed)
A few things to remember from today's visit:   Cellulitis of left upper extremity - Plan: doxycycline (VIBRA-TABS) 100 MG tablet  Generalized anxiety disorder - Plan: ALPRAZolam (XANAX) 0.5 MG tablet  Insomnia, unspecified type - Plan: ALPRAZolam (XANAX) 0.5 MG tablet   Skin Tear Care A skin tear is a wound in which the top layer of skin peels off. To repair the skin, your doctor may use:  Tape.  Skin adhesive strips.  A bandage (dressing) may also be placed over the tape or skin adhesive strips. How to care for your skin tear  Clean the wound as told by your doctor. You may be told to keep the wound dry for the first few days. If you are told to clean the wound: ? Wash the wound with mild soap and water or a salt-water (saline) solution. ? Rinse the wound with water to remove all soap. ? Do not rub the wound dry. Let the wound air dry.  Change any dressings as told by your doctor. This includes changing the dressing if it gets wet, gets dirty, or starts to smell bad.  Do not scratch or pick at the wound.  Protect the injured area until it has healed.  Check your wound every day for signs of infection. Check for: ? More redness, swelling, or pain. ? More fluid or blood. ? Warmth. ? Pus or a bad smell. Medicines   Take over-the-counter and prescription medicines only as told by your doctor.  If you were prescribed an antibiotic medicine, take or apply it as told by your doctor. Do not stop using the antibiotic even if your condition gets better. General instructions  Keep the bandage dry as told by your doctor.  Do not take baths, swim, or do anything that puts your wound underwater until your doctor says it is okay.  Keep all follow-up visits as told by your doctor. This is important. Contact a doctor if:  You have more redness, swelling, or pain around your wound.  You have more fluid or blood coming from your wound.  Your wound feels warm to the touch.  You  have pus or a bad smell coming from your wound. Get help right away if:  You have a red streak that goes away from the skin tear.  You have a fever and chills and your symptoms suddenly get worse. This information is not intended to replace advice given to you by your health care provider. Make sure you discuss any questions you have with your health care provider. Document Released: 07/22/2008 Document Revised: 06/08/2016 Document Reviewed: 09/03/2015 Elsevier Interactive Patient Education  [employer].  Please be sure medication list is accurate. If a new problem present, please set up appointment sooner than planned today.

## 2018-10-06 NOTE — Patient Instructions (Addendum)
Medication Instructions:  Your provider recommends that you continue on your current medications as directed. Please refer to the Current Medication list given to you today.    Labwork: None  Testing/Procedures: Your provider has requested that you have an echocardiogram. Echocardiography is a painless test that uses sound waves to create images of your heart. It provides your doctor with information about the size and shape of your heart and how well your heart's chambers and valves are working. This procedure takes approximately one hour. There are no restrictions for this procedure. Your echo is scheduled February 10, 2019 at 1:00PM. Please arrive by 12:30PM for this appointment.  Follow-Up: You have an appointment scheduled with Nell Range, PA immediately following your echocardiogram on February 10, 2019.  Your provider recommends that you schedule a follow-up appointment with Dr. [name] in February.

## 2018-10-06 NOTE — Progress Notes (Signed)
ACUTE VISIT   HPI:  Chief Complaint  Patient presents with  . Open sores on left arm    [name] is a 84 y.o. female, who is here today with her daughter complaining of erythema, pruritus, and superficial excoriations. Problem started about 5 days ago after her dog scratched her left forearm. Constant pruritus and "stinging" sensation. She has not identified exacerbating or alleviating factors.  No history of sick contact or known history of insect bite.  According to her daughter, she has been scratching area frequently, causing bleeding. She has erythema on forearm.  She has not started a new medication, no new detergents or body products, and new outdoor exposure. No prior history of similar problem.  Negative for fever, chills, worsening joint pain, or MS changes.  No changes in dyspnea or COPD symptoms.  Problem seems to be improving.  She has tried OTC lotion.  She is on Plavix, she has not noted nose/gum bleed or more bruising than usual  She has an noted skin tear, dog scratch, covered with a bandage on right forearm.  Her daughter is also requesting a prescription for Xanax to take to a different pharmacy.  There is a shortage of Xanax 0.5 mg, so she is going to try to get a prescription from a different pharmacy.   Review of Systems  Constitutional: Positive for fatigue (No more than usual.). Negative for appetite change, chills and fever.  HENT: Negative for mouth sores and sore throat.   Respiratory: Positive for shortness of breath (stable). Negative for wheezing.   Gastrointestinal: Negative for abdominal pain, nausea and vomiting.  Skin: Positive for rash and wound.  Neurological: Negative for weakness and numbness.  Hematological: Bruises/bleeds easily.  Psychiatric/Behavioral: Negative for confusion. The patient is nervous/anxious.       Current Outpatient Medications on File Prior to Visit  Medication Sig Dispense Refill  .  albuterol (PROVENTIL) (2.5 MG/3ML) 0.083% nebulizer solution Take 3 mLs (2.5 mg total) by nebulization every 4 (four) hours as needed for wheezing or shortness of breath. DX: COPD J44.9 75 mL 12  . albuterol (VENTOLIN HFA) 108 (90 Base) MCG/ACT inhaler Inhale 2 puffs into the lungs every 6 (six) hours as needed for wheezing or shortness of breath. 18 Inhaler 2  . apixaban (ELIQUIS) 5 MG TABS tablet Take 1 tablet (5 mg total) by mouth 2 (two) times daily. 180 tablet 0  . arformoterol (BROVANA) 15 MCG/2ML NEBU Take 2 mLs (15 mcg total) by nebulization 2 (two) times daily. DX: COPD J44.9 (Patient taking differently: Take 15 mcg by nebulization 2 (two) times daily. ) 120 mL 12  . budesonide (PULMICORT) 0.5 MG/2ML nebulizer solution Take 2 mLs (0.5 mg total) by nebulization 2 (two) times daily. DX: COPD J44.9 120 mL 12  . Cholecalciferol (VITAMIN D) 1000 UNITS capsule Take 1,000 Units by mouth daily.      . clopidogrel (PLAVIX) 75 MG tablet Take 1 tablet (75 mg total) by mouth daily. 30 tablet 11  . escitalopram (LEXAPRO) 20 MG tablet Take 1 tablet (20 mg total) by mouth daily. 90 tablet 1  . multivitamin-lutein (OCUVITE-LUTEIN) CAPS capsule Take 1 capsule by mouth daily.    . simvastatin (ZOCOR) 20 MG tablet Take 1 tablet (20 mg total) by mouth at bedtime. 90 tablet 3  . traZODone (DESYREL) 50 MG tablet Take 0.5-1 tablets (25-50 mg total) by mouth at bedtime as needed for sleep. 90 tablet 0   No current  facility-administered medications on file prior to visit.      Past Medical History:  Diagnosis Date  . Anxiety   . congenital nystagmus   . COPD (chronic obstructive pulmonary disease) ([place])   . Coronary artery disease   . DJD (degenerative joint disease)   . Fibromyalgia   . Low back pain syndrome   . Memory loss   . Other and unspecified hyperlipidemia   . Severe aortic stenosis   . Venous insufficiency    Allergies  Allergen Reactions  . Contrast Media [Iodinated Diagnostic Agents]  Itching, Rash and Other (See Comments)    Hypotension, Skin turns red like a sunburn  . Iohexol Itching, Rash and Other (See Comments)    Hypotension, Skin turns red like a sunburn    Social History   Socioeconomic History  . Marital status: Widowed    Spouse name: Not on file  . Number of children: 2  . Years of education: Not on file  . Highest education level: Not on file  Occupational History  . Occupation: [employer]  Social Needs  . Financial resource strain: Not on file  . Food insecurity:    Worry: Not on file    Inability: Not on file  . Transportation needs:    Medical: Not on file    Non-medical: Not on file  Tobacco Use  . Smoking status: Former Smoker    Packs/day: 2.00    Years: 30.00    Pack years: 60.00    Types: Cigarettes    Last attempt to quit: 10/28/1991    Years since quitting: 26.9  . Smokeless tobacco: Never Used  Substance and Sexual Activity  . Alcohol use: Yes    Comment: 1 drink per night  . Drug use: No  . Sexual activity: Not on file  Lifestyle  . Physical activity:    Days per week: Not on file    Minutes per session: Not on file  . Stress: Not on file  Relationships  . Social connections:    Talks on phone: Not on file    Gets together: Not on file    Attends religious service: Not on file    Active member of club or organization: Not on file    Attends meetings of clubs or organizations: Not on file    Relationship status: Not on file  Other Topics Concern  . Not on file  Social History Narrative   1 sibling alive age 4   1 sibling alive age 70   1 sibling deceased age 29   1 sibling alive age 65    Vitals:   10/06/18 1204  BP: 116/80  Pulse: 93  Resp: 16  Temp: (!) 97.5 F (36.4 C)  SpO2: 95%   Body mass index is 22.03 kg/m.   Physical Exam  Nursing note and vitals reviewed. Constitutional: She is oriented to person, place, and time. She appears well-developed. No distress.  HENT:  Head:  Normocephalic and atraumatic.  Mouth/Throat: Oropharynx is clear and moist and mucous membranes are normal. She has dentures.  Eyes: Conjunctivae are normal.  Cardiovascular: Normal rate and regular rhythm.  Murmur (SEM RUSB III/VI) heard. Respiratory: Effort normal and breath sounds normal. No respiratory distress.  Musculoskeletal: She exhibits no edema.  Lymphadenopathy:    She has no cervical adenopathy.  Neurological: She is alert and oriented to person, place, and time. Gait (Gait assisted with a cane.) abnormal.  Skin: Skin is warm. Abrasion, ecchymosis  and rash noted. There is erythema.     Thin dry skin on upper extremities. Superficial excoriations, some with bloody oozing, mainly on left forearm and a few on dorsum of left hand. Erythema and local heat, no tenderness or induration.  Ecchymotic lesions scattered on both forearms.  Psychiatric: Her mood appears anxious.  Well groomed, good eye contact.      ASSESSMENT AND PLAN:  Ms. Diedre was seen today for open sores on left arm.  Diagnoses and all orders for this visit:  Cellulitis of left upper extremity We discussed some side effects of antibiotics. Strongly recommend trying not to scratch his skin and to keep fingernails short and clean. Instructed about warning signs. Follow-up as needed.  -     doxycycline (VIBRA-TABS) 100 MG tablet; Take 1 tablet (100 mg total) by mouth 2 (two) times daily for 7 days.  Skin tear of right forearm without complication, initial encounter It is covered with a sticky bandage, which I was not able to remove.  There is not surrounding erythema. Recommend avoiding covering skin tears with sticky bandage, it may aggravate problem when trying to remove it. Her daughter will try to take bandage off at home.  Skin pruritus Topical steroid recommended twice daily as needed. Some side effect discussed.  -     triamcinolone cream (KENALOG) 0.1 %; Apply 1 application topically 2 (two)  times daily.  Generalized anxiety disorder -     ALPRAZolam (XANAX) 0.5 MG tablet; Take 1 tablet (0.5 mg total) by mouth at bedtime as needed for anxiety.  Insomnia, unspecified type -     ALPRAZolam (XANAX) 0.5 MG tablet; Take 1 tablet (0.5 mg total) by mouth at bedtime as needed for anxiety.    Return if symptoms worsen or fail to improve, for Keep next appt..      G. [name], MD  [facility]. [place] office.

## 2018-10-07 ENCOUNTER — Telehealth: Payer: Self-pay | Admitting: Cardiovascular Disease

## 2018-10-07 ENCOUNTER — Ambulatory Visit: Payer: Self-pay | Admitting: Physician Assistant

## 2018-10-07 NOTE — Telephone Encounter (Signed)
-----   Message from Valora Corporal, RN sent at 10/07/2018  4:08 PM EST ----- Regarding: FW: Dr. [name] follow-up Could someone reach out and assist patient with getting this appointment? Thanks, Lesleigh Noe ----- Message ----- From: Theodoro Parma, RN Sent: 10/06/2018   2:20 PM EST To: Valora Corporal, RN, Theodoro Parma, RN Subject: Dr. [name] follow-up                           Nell Range saw this patient today and wants her to see Dr. [name] in February. However, she did not go to check out to get it scheduled. Will you reach out to her to arrange follow-up in February?   Thank you!  Valetta Fuller

## 2018-10-07 NOTE — Telephone Encounter (Signed)
lmov to schedule appt  °

## 2018-10-12 NOTE — Telephone Encounter (Signed)
done

## 2018-10-29 ENCOUNTER — Encounter: Payer: Self-pay | Admitting: Physician Assistant

## 2018-11-08 ENCOUNTER — Ambulatory Visit: Payer: Self-pay | Admitting: Family Medicine

## 2018-11-12 ENCOUNTER — Telehealth: Payer: Self-pay | Admitting: Family Medicine

## 2018-11-12 ENCOUNTER — Other Ambulatory Visit: Payer: Self-pay | Admitting: Family Medicine

## 2018-11-12 DIAGNOSIS — L299 Pruritus, unspecified: Secondary | ICD-10-CM

## 2018-11-12 MED ORDER — TRIAMCINOLONE ACETONIDE 0.1 % EX CREA: 1.0000 "application " | TOPICAL_CREAM | Freq: Two times a day (BID) | CUTANEOUS | 0 refills | Status: DC

## 2018-11-12 NOTE — Telephone Encounter (Signed)
Copied from [place] [id number]. Topic: Quick Communication - See Telephone Encounter >> Nov 12, 2018 12:31 PM Rutherford Nail, NT wrote: CRM for notification. See Telephone encounter for: 11/12/18. Patient's daughter calling and states that she brought the patient into the office for the sores on her arms on 10/06/18. States that the patient is still picking at them stating that they itch. Would like to know if there is an anti itch cream/lotion that they could get OTC or prescribed to stop the itching. Please advise.   CB#:[phone]

## 2018-11-12 NOTE — Telephone Encounter (Signed)
Message sent to Dr. Jordan for review and approval. 

## 2018-11-12 NOTE — Telephone Encounter (Signed)
Spoke with patient's daughter and gave directions per Dr. [name].

## 2018-11-12 NOTE — Telephone Encounter (Signed)
I sent to her pharmacy a prescription for topical steroids, which we used for itching. OTC Sarna lotion can also be used on affected area to treat itching. We may need to consider dermatologist evaluation if problem is persistent.  Thanks, BJ

## 2018-11-17 ENCOUNTER — Other Ambulatory Visit: Payer: Self-pay | Admitting: Internal Medicine

## 2018-11-17 DIAGNOSIS — J449 Chronic obstructive pulmonary disease, unspecified: Secondary | ICD-10-CM

## 2018-11-19 ENCOUNTER — Other Ambulatory Visit (HOSPITAL_COMMUNITY): Payer: Self-pay | Admitting: Physician Assistant

## 2018-11-19 NOTE — Telephone Encounter (Signed)
Pt last saw [name] 10/06/18, last labs 08/27/18 Creat 0.89, age 85, weight 60kg, based on specified criteria pt is on appropriate dosage of Eliquis 5mg  BID.  Will refill rx.

## 2018-11-24 DIAGNOSIS — S6992XA Unspecified injury of left wrist, hand and finger(s), initial encounter: Secondary | ICD-10-CM | POA: Diagnosis not present

## 2018-11-26 DIAGNOSIS — S61215A Laceration without foreign body of left ring finger without damage to nail, initial encounter: Secondary | ICD-10-CM | POA: Diagnosis not present

## 2018-11-26 DIAGNOSIS — S6992XA Unspecified injury of left wrist, hand and finger(s), initial encounter: Secondary | ICD-10-CM | POA: Diagnosis not present

## 2018-12-03 DIAGNOSIS — S61215D Laceration without foreign body of left ring finger without damage to nail, subsequent encounter: Secondary | ICD-10-CM | POA: Diagnosis not present

## 2018-12-14 ENCOUNTER — Encounter (HOSPITAL_COMMUNITY): Payer: Self-pay | Admitting: Emergency Medicine

## 2018-12-14 ENCOUNTER — Encounter: Payer: Self-pay | Admitting: Internal Medicine

## 2018-12-14 ENCOUNTER — Emergency Department (HOSPITAL_COMMUNITY): Payer: Medicare Other

## 2018-12-14 ENCOUNTER — Ambulatory Visit (INDEPENDENT_AMBULATORY_CARE_PROVIDER_SITE_OTHER): Payer: Medicare Other | Admitting: Internal Medicine

## 2018-12-14 ENCOUNTER — Inpatient Hospital Stay (HOSPITAL_COMMUNITY)
Admission: EM | Admit: 2018-12-14 | Discharge: 2018-12-18 | DRG: 177 | Disposition: A | Discharge status: Home Health | Payer: Medicare Other | Attending: Internal Medicine | Admitting: Internal Medicine

## 2018-12-14 VITALS — BP 118/68 | HR 93 | Ht 64.0 in | Wt 141.2 lb

## 2018-12-14 DIAGNOSIS — Z86718 Personal history of other venous thrombosis and embolism: Secondary | ICD-10-CM

## 2018-12-14 DIAGNOSIS — G4700 Insomnia, unspecified: Secondary | ICD-10-CM | POA: Diagnosis present

## 2018-12-14 DIAGNOSIS — J449 Chronic obstructive pulmonary disease, unspecified: Secondary | ICD-10-CM | POA: Diagnosis not present

## 2018-12-14 DIAGNOSIS — Z79899 Other long term (current) drug therapy: Secondary | ICD-10-CM

## 2018-12-14 DIAGNOSIS — J690 Pneumonitis due to inhalation of food and vomit: Secondary | ICD-10-CM | POA: Diagnosis not present

## 2018-12-14 DIAGNOSIS — F411 Generalized anxiety disorder: Secondary | ICD-10-CM | POA: Diagnosis present

## 2018-12-14 DIAGNOSIS — I700 Atherosclerosis of aorta: Secondary | ICD-10-CM | POA: Diagnosis present

## 2018-12-14 DIAGNOSIS — Z9981 Dependence on supplemental oxygen: Secondary | ICD-10-CM

## 2018-12-14 DIAGNOSIS — E782 Mixed hyperlipidemia: Secondary | ICD-10-CM | POA: Diagnosis present

## 2018-12-14 DIAGNOSIS — Z87891 Personal history of nicotine dependence: Secondary | ICD-10-CM

## 2018-12-14 DIAGNOSIS — F32A Depression, unspecified: Secondary | ICD-10-CM | POA: Diagnosis present

## 2018-12-14 DIAGNOSIS — R069 Unspecified abnormalities of breathing: Secondary | ICD-10-CM | POA: Diagnosis not present

## 2018-12-14 DIAGNOSIS — N183 Chronic kidney disease, stage 3 unspecified: Secondary | ICD-10-CM | POA: Diagnosis present

## 2018-12-14 DIAGNOSIS — I2510 Atherosclerotic heart disease of native coronary artery without angina pectoris: Secondary | ICD-10-CM | POA: Diagnosis present

## 2018-12-14 DIAGNOSIS — R000 Tachycardia, unspecified: Secondary | ICD-10-CM | POA: Diagnosis not present

## 2018-12-14 DIAGNOSIS — J80 Acute respiratory distress syndrome: Secondary | ICD-10-CM | POA: Diagnosis not present

## 2018-12-14 DIAGNOSIS — R0602 Shortness of breath: Secondary | ICD-10-CM

## 2018-12-14 DIAGNOSIS — I493 Ventricular premature depolarization: Secondary | ICD-10-CM | POA: Diagnosis present

## 2018-12-14 DIAGNOSIS — I872 Venous insufficiency (chronic) (peripheral): Secondary | ICD-10-CM | POA: Diagnosis present

## 2018-12-14 DIAGNOSIS — H9190 Unspecified hearing loss, unspecified ear: Secondary | ICD-10-CM | POA: Diagnosis present

## 2018-12-14 DIAGNOSIS — M797 Fibromyalgia: Secondary | ICD-10-CM | POA: Diagnosis present

## 2018-12-14 DIAGNOSIS — R0609 Other forms of dyspnea: Secondary | ICD-10-CM

## 2018-12-14 DIAGNOSIS — E785 Hyperlipidemia, unspecified: Secondary | ICD-10-CM | POA: Diagnosis present

## 2018-12-14 DIAGNOSIS — Z7902 Long term (current) use of antithrombotics/antiplatelets: Secondary | ICD-10-CM

## 2018-12-14 DIAGNOSIS — Z91041 Radiographic dye allergy status: Secondary | ICD-10-CM

## 2018-12-14 DIAGNOSIS — I5032 Chronic diastolic (congestive) heart failure: Secondary | ICD-10-CM | POA: Diagnosis present

## 2018-12-14 DIAGNOSIS — Z86711 Personal history of pulmonary embolism: Secondary | ICD-10-CM

## 2018-12-14 DIAGNOSIS — F329 Major depressive disorder, single episode, unspecified: Secondary | ICD-10-CM | POA: Diagnosis present

## 2018-12-14 DIAGNOSIS — J9610 Chronic respiratory failure, unspecified whether with hypoxia or hypercapnia: Secondary | ICD-10-CM | POA: Diagnosis present

## 2018-12-14 DIAGNOSIS — I5033 Acute on chronic diastolic (congestive) heart failure: Secondary | ICD-10-CM | POA: Diagnosis present

## 2018-12-14 DIAGNOSIS — J441 Chronic obstructive pulmonary disease with (acute) exacerbation: Secondary | ICD-10-CM | POA: Diagnosis present

## 2018-12-14 DIAGNOSIS — J9621 Acute and chronic respiratory failure with hypoxia: Secondary | ICD-10-CM | POA: Diagnosis present

## 2018-12-14 DIAGNOSIS — Z955 Presence of coronary angioplasty implant and graft: Secondary | ICD-10-CM

## 2018-12-14 DIAGNOSIS — Z7901 Long term (current) use of anticoagulants: Secondary | ICD-10-CM

## 2018-12-14 DIAGNOSIS — E559 Vitamin D deficiency, unspecified: Secondary | ICD-10-CM | POA: Diagnosis present

## 2018-12-14 DIAGNOSIS — Z7951 Long term (current) use of inhaled steroids: Secondary | ICD-10-CM

## 2018-12-14 DIAGNOSIS — I2699 Other pulmonary embolism without acute cor pulmonale: Secondary | ICD-10-CM | POA: Diagnosis present

## 2018-12-14 DIAGNOSIS — R0902 Hypoxemia: Secondary | ICD-10-CM | POA: Diagnosis not present

## 2018-12-14 DIAGNOSIS — I350 Nonrheumatic aortic (valve) stenosis: Secondary | ICD-10-CM | POA: Diagnosis present

## 2018-12-14 DIAGNOSIS — R0689 Other abnormalities of breathing: Secondary | ICD-10-CM | POA: Diagnosis not present

## 2018-12-14 LAB — CBC WITH DIFFERENTIAL/PLATELET
Abs Immature Granulocytes: 0.01 10*3/uL (ref 0.00–0.07)
Basophils Absolute: 0 10*3/uL (ref 0.0–0.1)
Basophils Relative: 0 %
Eosinophils Absolute: 0 10*3/uL (ref 0.0–0.5)
Eosinophils Relative: 1 %
HCT: 34.9 % — ABNORMAL LOW (ref 36.0–46.0)
Hemoglobin: 10.2 g/dL — ABNORMAL LOW (ref 12.0–15.0)
IMMATURE GRANULOCYTES: 0 %
Lymphocytes Relative: 21 %
Lymphs Abs: 1.1 10*3/uL (ref 0.7–4.0)
MCH: 28.5 pg (ref 26.0–34.0)
MCHC: 29.2 g/dL — ABNORMAL LOW (ref 30.0–36.0)
MCV: 97.5 fL (ref 80.0–100.0)
Monocytes Absolute: 0.6 10*3/uL (ref 0.1–1.0)
Monocytes Relative: 12 %
NEUTROS PCT: 66 %
Neutro Abs: 3.6 10*3/uL (ref 1.7–7.7)
Platelets: 241 10*3/uL (ref 150–400)
RBC: 3.58 MIL/uL — ABNORMAL LOW (ref 3.87–5.11)
RDW: 16.6 % — ABNORMAL HIGH (ref 11.5–15.5)
WBC: 5.4 10*3/uL (ref 4.0–10.5)
nRBC: 0 % (ref 0.0–0.2)

## 2018-12-14 LAB — BASIC METABOLIC PANEL
Anion gap: 10 (ref 5–15)
BUN: 11 mg/dL (ref 8–23)
CO2: 22 mmol/L (ref 22–32)
Calcium: 8.9 mg/dL (ref 8.9–10.3)
Chloride: 108 mmol/L (ref 98–111)
Creatinine, Ser: 1.07 mg/dL — ABNORMAL HIGH (ref 0.44–1.00)
GFR calc Af Amer: 53 mL/min — ABNORMAL LOW (ref >60)
GFR calc non Af Amer: 46 mL/min — ABNORMAL LOW (ref >60)
Glucose, Bld: 124 mg/dL — ABNORMAL HIGH (ref 70–99)
Potassium: 3.5 mmol/L (ref 3.5–5.1)
SODIUM: 140 mmol/L (ref 135–145)

## 2018-12-14 LAB — TROPONIN I

## 2018-12-14 MED ORDER — FLUTTER DEVI: 1.0000 | Freq: Every day | 0 refills | Status: DC

## 2018-12-14 MED ORDER — AMBULATORY NON FORMULARY MEDICATION: 0 refills | Status: DC

## 2018-12-14 MED ORDER — IPRATROPIUM-ALBUTEROL 0.5-2.5 (3) MG/3ML IN SOLN
3.0000 mL | Freq: Once | RESPIRATORY_TRACT | Status: AC
  Administered 2018-12-14: 3 mL via RESPIRATORY_TRACT
  Filled 2018-12-14: qty 3

## 2018-12-14 NOTE — ED Provider Notes (Signed)
[facility] EMERGENCY DEPARTMENT Provider Note   CSN: [id number] Arrival date & time: 12/14/18  2209    History   Chief Complaint Chief Complaint  Patient presents with  . Shortness of Breath    HPI Jessica [name] is a 85 y.o. female.     The history is provided by the patient and medical records.  Shortness of Breath  Associated symptoms: cough (chronic)      85 y.o. F with hx of anxiety, severe COPD, CAD, severe AS, fibromyalgia, venous insufficiency, memory loss, presenting to the ED for SOB.  Patient saw her pulmonologist today and seemed to be doing ok but got increasingly SOB this evening.  She did a home neb and EMS was called.  Upon EMS arrival, she was found to be agitated and hypoxic down to 77% on RA.  She was given total of 10 mg albuterol, 0.5 mg Atrovent, and 1.5 Solu-Medrol in route here.  She was started on CPAP and upon arrival states she is feeling significantly better.  States she has been short of breath for about a year, waxing and waning in severity.  She has had a cough for several weeks, some clear mucous noted.  No fevers.  No chest pain.  She was supposed to have aortic valve replacement but delayed due to incidental findings of PE on CTA.  She is currently on Eliquis.  Due to see her cardiologist tomorrow.  Patient lives at home with daughter.  Past Medical History:  Diagnosis Date  . Anxiety   . congenital nystagmus   . COPD (chronic obstructive pulmonary disease) ([place])   . Coronary artery disease   . DJD (degenerative joint disease)   . Fibromyalgia   . Low back pain syndrome   . Memory loss   . Other and unspecified hyperlipidemia   . Severe aortic stenosis   . Venous insufficiency     Patient Active Problem List   Diagnosis Date Noted  . Acute pulmonary embolism ([place]) 08/25/2018  . Atherosclerosis of native coronary artery of native heart with stable angina pectoris ([place]) 07/23/2018  . Severe aortic stenosis   . Aortic  atherosclerosis ([place]) 05/20/2018  . Insomnia 04/08/2016  . Chronic bronchitis ([place]) 03/10/2016  . COPD (chronic obstructive pulmonary disease) ([place]) 05/07/2015  . Labial cyst 05/07/2015  . Murmur 11/08/2012  . PULMONARY NODULE 01/06/2011  . Vitamin D deficiency 01/14/2009  . Hyperlipidemia 01/14/2009  . DEGENERATIVE JOINT DISEASE 06/12/2008  . LOW BACK PAIN SYNDROME 12/15/2007  . FIBROMYALGIA 12/15/2007  . Generalized anxiety disorder 12/14/2007    Past Surgical History:  Procedure Laterality Date  . ABDOMINAL HYSTERECTOMY    . ANTERIOR CERVICAL DISCECTOMY    . CARPAL TUNNEL RELEASE  12/2011   right arm  . CATARACT EXTRACTION    . CORONARY ATHERECTOMY  07/23/2018   PTCA/orbital atherectomy/DES x 1 proximal to mid LAD  . CORONARY ATHERECTOMY N/A 07/23/2018   Procedure: CORONARY ATHERECTOMY;  Surgeon: Burnell Blanks, MD;  Location: [facility];  Service: Cardiovascular;  Laterality: N/A;  . CORONARY STENT INTERVENTION    . CORONARY STENT INTERVENTION N/A 07/23/2018   Procedure: CORONARY STENT INTERVENTION;  Surgeon: Burnell Blanks, MD;  Location: [facility];  Service: Cardiovascular;  Laterality: N/A;  . LUMBAR LAMINECTOMY    . right knee arthroscopy    . right shoulder replacement  04/2009  . right shoulder surgery  2008   Dr. Noemi Chapel  . RIGHT/LEFT HEART CATH  AND CORONARY ANGIOGRAPHY N/A 07/07/2018   Procedure: RIGHT/LEFT HEART CATH AND CORONARY ANGIOGRAPHY;  Surgeon: Burnell Blanks, MD;  Location: [facility];  Service: Cardiovascular;  Laterality: N/A;  . ULTRASOUND GUIDANCE FOR VASCULAR ACCESS  07/07/2018   Procedure: Ultrasound Guidance For Vascular Access;  Surgeon: Burnell Blanks, MD;  Location: [facility];  Service: Cardiovascular;;     OB History   No obstetric history on file.      Home Medications    Prior to Admission medications   Medication Sig Start Date End Date Taking? Authorizing Provider    albuterol (PROVENTIL) (2.5 MG/3ML) 0.083% nebulizer solution Take 3 mLs (2.5 mg total) by nebulization every 4 (four) hours as needed for wheezing or shortness of breath. DX: COPD J44.9 04/12/18   Flora Lipps, MD  ALPRAZolam Duanne Moron) 0.5 MG tablet Take 1 tablet (0.5 mg total) by mouth at bedtime as needed for anxiety. 10/06/18   [name], [name], MD  AMBULATORY NON FORMULARY MEDICATION Medication Name: Incentive spirometer Use 10-15 times per day 12/14/18   Flora Lipps, MD  arformoterol (BROVANA) 15 MCG/2ML NEBU Take 2 mLs (15 mcg total) by nebulization 2 (two) times daily. DX: COPD J44.9 Patient taking differently: Take 15 mcg by nebulization 2 (two) times daily.  07/27/17   Flora Lipps, MD  budesonide (PULMICORT) 0.5 MG/2ML nebulizer solution Take 2 mLs (0.5 mg total) by nebulization 2 (two) times daily. DX: COPD J44.9 07/09/17   Flora Lipps, MD  Cholecalciferol (VITAMIN D) 1000 UNITS capsule Take 1,000 Units by mouth daily.      [provider]  clopidogrel (PLAVIX) 75 MG tablet Take 1 tablet (75 mg total) by mouth daily. 07/13/18 07/13/19  Burnell Blanks, MD  ELIQUIS 5 MG TABS tablet TAKE 1 TABLET BY MOUTH TWICE A DAY 11/19/18   [name], MD  escitalopram (LEXAPRO) 20 MG tablet Take 1 tablet (20 mg total) by mouth daily. 06/30/18   [name], [name], MD  multivitamin-lutein [facility]) CAPS capsule Take 1 capsule by mouth daily.    [provider]  Respiratory Therapy Supplies (FLUTTER) DEVI 1 each by Does not apply route daily. 12/14/18   Flora Lipps, MD  simvastatin (ZOCOR) 20 MG tablet Take 1 tablet (20 mg total) by mouth at bedtime. 12/23/17   [name], MD  traZODone (DESYREL) 50 MG tablet Take 0.5-1 tablets (25-50 mg total) by mouth at bedtime as needed for sleep. 06/30/18   [name], [name], MD  triamcinolone cream (KENALOG) 0.1 % Apply 1 application topically 2 (two) times daily. 11/12/18   [name], [name], MD  VENTOLIN HFA 108 (90 Base) MCG/ACT  inhaler INHALE 2 PUFFS EVERY 6HRS AS NEEDED FOR WHEEZING OR SHORTNESS OF BREATH. DO NOT USE IF USING NEBS. 11/17/18   Flora Lipps, MD    Family History Family History  Problem Relation Age of Onset  . Other Mother        broken hip  . Other Father        kidney problems    Social History Social History   Tobacco Use  . Smoking status: Former Smoker    Packs/day: 2.00    Years: 30.00    Pack years: 60.00    Types: Cigarettes    Last attempt to quit: 10/28/1991    Years since quitting: 27.1  . Smokeless tobacco: Never Used  Substance Use Topics  . Alcohol use: Yes    Comment: 1 drink per night  .  Drug use: No     Allergies   Contrast media [iodinated diagnostic agents] and Iohexol   Review of Systems Review of Systems  Respiratory: Positive for cough (chronic) and shortness of breath.   All other systems reviewed and are negative.    Physical Exam Updated Vital Signs BP 132/71 (BP Location: Right Arm)   Pulse (!) 102   Temp 98.1 F (36.7 C) (Temporal)   Resp 15   Ht 5\' 5"  (1.651 m)   SpO2 100%   BMI 23.50 kg/m   Physical Exam Vitals signs and nursing note reviewed.  Constitutional:      Appearance: She is well-developed.     Comments: Elderly, very hard of hearing  HENT:     Head: Normocephalic and atraumatic.  Eyes:     Conjunctiva/sclera: Conjunctivae normal.     Pupils: Pupils are equal, round, and reactive to light.  Neck:     Musculoskeletal: Normal range of motion.  Cardiovascular:     Rate and Rhythm: Normal rate and regular rhythm.     Heart sounds: Murmur present.     Comments: Prominent murmur consistent with AS Pulmonary:     Effort: Pulmonary effort is normal.     Breath sounds: Normal breath sounds. No decreased breath sounds, wheezing or rhonchi.     Comments: No significant wheezing, cough productive of clear mucous; no acute distress Abdominal:     General: Bowel sounds are normal.     Palpations: Abdomen is soft.   Musculoskeletal: Normal range of motion.  Skin:    General: Skin is warm and dry.  Neurological:     Mental Status: She is alert and oriented to person, place, and time.      ED Treatments / Results  Labs (all labs ordered are listed, but only abnormal results are displayed) Labs Reviewed  CBC WITH DIFFERENTIAL/PLATELET - Abnormal; Notable for the following components:      Result Value   RBC 3.58 (*)    Hemoglobin 10.2 (*)    HCT 34.9 (*)    MCHC 29.2 (*)    RDW 16.6 (*)    All other components within normal limits  BASIC METABOLIC PANEL - Abnormal; Notable for the following components:   Glucose, Bld 124 (*)    Creatinine, Ser 1.07 (*)    GFR calc non Af Amer 46 (*)    GFR calc Af Amer 53 (*)    All other components within normal limits  TROPONIN I    EKG EKG Interpretation  Date/Time:  Tuesday December 14 2018 22:09:27 EST Ventricular Rate:  103 PR Interval:    QRS Duration: 98 QT Interval:  382 QTC Calculation: 501 R Axis:   -52 Text Interpretation:  Sinus tachycardia Multiform ventricular premature complexes LAD, consider left anterior fascicular block LVH with secondary repolarization abnormality Anterior infarct, old Prolonged QT interval Interpretation limited secondary to artifact Abnormal ekg Confirmed by Ripley Fraise ([id number]) on 12/14/2018 11:44:55 PM   Radiology Dg Chest Port 1 View  Result Date: 12/14/2018 CLINICAL DATA:  Shortness of breath EXAM: PORTABLE CHEST 1 VIEW COMPARISON:  11/09/2014, CT chest 08/24/2018 FINDINGS: Postsurgical changes in the cervical spine. Right shoulder replacement. Scarring at the CP angles. Mild cardiomegaly with aortic atherosclerosis. Central vascular congestion. No focal airspace disease or pneumothorax. IMPRESSION: No active disease.  Cardiomegaly with central vascular congestion Electronically Signed   By: Donavan Foil M.D.   On: 12/14/2018 22:51    Procedures Procedures (including critical care time)  CRITICAL  CARE Performed by: Larene Pickett   Total critical care time: 45 minutes  Critical care time was exclusive of separately billable procedures and treating other patients.  Critical care was necessary to treat or prevent imminent or life-threatening deterioration.  Critical care was time spent personally by me on the following activities: development of treatment plan with patient and/or surrogate as well as nursing, discussions with consultants, evaluation of patient's response to treatment, examination of patient, obtaining history from patient or surrogate, ordering and performing treatments and interventions, ordering and review of laboratory studies, ordering and review of radiographic studies, pulse oximetry and re-evaluation of patient's condition.   Medications Ordered in ED Medications - No data to display   Initial Impression / Assessment and Plan / ED Course  I have reviewed the triage vital signs and the nursing notes.  Pertinent labs & imaging results that were available during my care of the patient were reviewed by me and considered in my medical decision making (see chart for details).  85 year old female arriving on CPAP for shortness of breath.  Has severe COPD, actually saw her pulmonologist earlier today.  Has been having worsening symptoms for about a year, worse over the past 2 weeks.  Has chronic cough, productive of clear mucus during exam.  She had 10mg  albuterol, 0.5mg  Atrovent, and 125mg  solu-medrol with EMS.  Upon arrival she was initially transitioned to BiPAP but requested trial off.  No significant wheezes or rhonchi heard on exam.  EKG is nonischemic.  Labs and CXR pending.  Will monitor closely.  11:42 PM Labs overall reassuring.  CXR without acute disease findings.  Patient reassessed after trial off bipap and additional nebs-- looks more labored again, breathing about 30 times a minute, speaking in 2-3 word phrases, and sats down to 90% on 3L.  Daughter has  arrived now, states her breathing overall has had a steady decline over the past few weeks.  States she is barely even able to walk across the house without stopping several times due to shortness of breath.  She is continued using her 2 L at nighttime as instructed, has not usually been requiring this during the day generally.  Given her decline and overall poor appearance here, will admit for COPD exacerbation.  Discussed with Dr. Blaine Hamper-- will evaluate in the ED and admit for ongoing care.  Final Clinical Impressions(s) / ED Diagnoses   Final diagnoses:  COPD exacerbation [facility])    ED Discharge Orders    None       Larene Pickett, PA-C 12/15/18 0059    Virgel Manifold, MD 12/16/18 2132

## 2018-12-14 NOTE — Progress Notes (Deleted)
Cardiology Office Note  Date:  12/14/2018   ID:  Jessica [name], DOB [birth year], MRN [id number]  PCP:  [name], [name], MD   No chief complaint on file.   HPI:  [name]  is an 85 y/o woman with  mild to moderate aortic valve stenosis,  h/o HL,  fibromyalgia,  CAD, aortic athero on CT 2016 anxiety  moderate COPD/emphysema,  asthmatic bronchitis Previous smoking history but stopped over 30 years ago.  total knee replacement July 2013 by her report Mild gait instability, walks with a cane.  Previous history of falls with trauma to her face/forehead requiring stitches. She presents for follow-up of her mild to moderate aortic valve disease(by echo 01/2015)  and shortness of breath  pre op work [employer] had a right and left heart cath on 07/07/18 which showed a severe, heavily calcified (80%) stenosis in the proximal LADthat required revascularization. The patient was discharged home but called inthe next day with complaints of an intensely pruritic rash. Steroids were called into her pharmacy. She returned back to [facility] on 07/23/18 for coronary atherectomy with stent placement to mLAD. Given previous allergy she was pre-medicated with steroids and benadryl. Despite pre medication she still had a delayed contrast reaction greater than 12 hours after PCI resulting in pruritus and generalized erythema and associated hypotensionw/ SBPinto the 90s. Due to her severe reaction is was decided to premedicate herbefore and after scans and keep inpatient. She did well with CT scans and had no reaction with pre/post regimen. Scan did show an incidental finding of an acute right lung PE. LE dopplers showed right leg DVT. She was started on Eliquis and continued on plavix given recent stent. Aspirin was discontinued. Plan is for 6 months of anticoagulation before possible TAVR.  proceed with TAVR after 6 months of oral anticoagulation to treat pulmonary embolus. 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  SOB walking to bathroom Family presents with her and feels the shortness of breath is getting worse Denies any lower extremity edema weight gain abdominal swelling  She has mild weakness but this is stable No PND orthopnea Denies any significant chest pain No orthostasis symptoms  EKG personally reviewed by myself on todays visit Shows normal sinus rhythm rate 82 bpm nonspecific ST abnormality  Other past medical history reviewed Still working at [employer] , does not want to retire   she continues to work on Production assistant, radio Using a neb treatment, breathing better, spitting up, thick "stuff" Pulmonary, Dr. Mortimer Fries,   significant exposure to secondhand smoke from her daughter   Previous fall Valentine's Day 2017, fractured her right clavicle ( aortic calcification seen on chest x-ray  At that time)  no surgery was performed  difficulty with her memory per the family    said details of her story over and over on her visit today  Echocardiogram April 2016 showing mild to moderate aortic valve stenosis Carotid ultrasound November 2015 showing mild bilateral carotid disease  Previous trip to the ER with CP in 8/11.  Had post hospital Myoview in 9/11 with normal EF and question of inferior ischemia.   medical therapy pursued at that time. Asymptomatic since then  No recurrent CP or undue dyspnea. Does get fatigued. No palpitations, CHF or syncope.      PMH:   has a past medical history of  Anxiety, congenital nystagmus, COPD (chronic obstructive pulmonary disease) ([place]), Coronary artery disease, DJD (degenerative joint disease), Fibromyalgia, Low back pain syndrome, Memory loss, Other and unspecified hyperlipidemia, Severe aortic stenosis, and Venous insufficiency.  PSH:    Past Surgical History:  Procedure Laterality Date  . ABDOMINAL  HYSTERECTOMY    . ANTERIOR CERVICAL DISCECTOMY    . CARPAL TUNNEL RELEASE  12/2011   right arm  . CATARACT EXTRACTION    . CORONARY ATHERECTOMY  07/23/2018   PTCA/orbital atherectomy/DES x 1 proximal to mid LAD  . CORONARY ATHERECTOMY N/A 07/23/2018   Procedure: CORONARY ATHERECTOMY;  Surgeon: Burnell Blanks, MD;  Location: [facility];  Service: Cardiovascular;  Laterality: N/A;  . CORONARY STENT INTERVENTION    . CORONARY STENT INTERVENTION N/A 07/23/2018   Procedure: CORONARY STENT INTERVENTION;  Surgeon: Burnell Blanks, MD;  Location: [facility];  Service: Cardiovascular;  Laterality: N/A;  . LUMBAR LAMINECTOMY    . right knee arthroscopy    . right shoulder replacement  04/2009  . right shoulder surgery  2008   Dr. Noemi Chapel  . RIGHT/LEFT HEART CATH AND CORONARY ANGIOGRAPHY N/A 07/07/2018   Procedure: RIGHT/LEFT HEART CATH AND CORONARY ANGIOGRAPHY;  Surgeon: Burnell Blanks, MD;  Location: [place];  Service: Cardiovascular;  Laterality: N/A;  . ULTRASOUND GUIDANCE FOR VASCULAR ACCESS  07/07/2018   Procedure: Ultrasound Guidance For Vascular Access;  Surgeon: Burnell Blanks, MD;  Location: [place] [facility];  Service: Cardiovascular;;    Current Outpatient Medications  Medication Sig Dispense Refill  . albuterol (PROVENTIL) (2.5 MG/3ML) 0.083% nebulizer solution Take 3 mLs (2.5 mg total) by nebulization every 4 (four) hours as needed for wheezing or shortness of breath. DX: COPD J44.9 75 mL 12  . ALPRAZolam (XANAX) 0.5 MG tablet Take 1 tablet (0.5 mg total) by mouth at bedtime as needed for anxiety. 30 tablet 2  . AMBULATORY NON FORMULARY MEDICATION Medication Name: Incentive spirometer Use 10-15 times per day 1 each 0  . arformoterol (BROVANA) 15 MCG/2ML NEBU Take 2 mLs (15 mcg total) by nebulization 2 (two) times daily. DX: COPD J44.9 (Patient taking differently: Take 15 mcg by nebulization 2 (two) times daily. ) 120 mL 12  .  budesonide (PULMICORT) 0.5 MG/2ML nebulizer solution Take 2 mLs (0.5 mg total) by nebulization 2 (two) times daily. DX: COPD J44.9 120 mL 12  . Cholecalciferol (VITAMIN D) 1000 UNITS capsule Take 1,000 Units by mouth daily.      . clopidogrel (PLAVIX) 75 MG tablet Take 1 tablet (75 mg total) by mouth daily. 30 tablet 11  . ELIQUIS 5 MG TABS tablet TAKE 1 TABLET BY MOUTH TWICE A DAY 60 tablet 5  . escitalopram (LEXAPRO) 20 MG tablet Take 1 tablet (20 mg total) by mouth daily. 90 tablet 1  . multivitamin-lutein (OCUVITE-LUTEIN) CAPS capsule Take 1 capsule by mouth daily.    Marland Kitchen Respiratory Therapy Supplies (FLUTTER) DEVI 1 each by Does not apply route daily. 1 each 0  . simvastatin (ZOCOR) 20 MG tablet Take 1 tablet (20 mg total) by mouth at bedtime. 90 tablet 3  . traZODone (DESYREL) 50 MG tablet Take 0.5-1 tablets (25-50 mg total) by mouth at bedtime as needed for sleep. 90 tablet 0  . triamcinolone cream (KENALOG) 0.1 % Apply 1 application topically 2 (two) times daily. 45 g 0  . VENTOLIN HFA 108 (90 Base) MCG/ACT inhaler INHALE 2 PUFFS EVERY 6HRS AS NEEDED FOR WHEEZING OR SHORTNESS  OF BREATH. DO NOT USE IF USING NEBS. 18 Inhaler 2   No current facility-administered medications for this visit.      Allergies:   Contrast media [iodinated diagnostic agents] and Iohexol   Social History:  The patient  reports that she quit smoking about 27 years ago. Her smoking use included cigarettes. She has a 60.00 pack-year smoking history. She has never used smokeless tobacco. She reports current alcohol use. She reports that she does not use drugs.   Family History:   family history includes Other in her father and mother.    Review of Systems: Review of Systems  Respiratory: Positive for shortness of breath.   Cardiovascular: Negative.   Gastrointestinal: Negative.   Musculoskeletal: Negative.   Neurological: Positive for weakness.  Psychiatric/Behavioral: Negative.   All other systems reviewed and  are negative.    PHYSICAL EXAM: VS:  There were no vitals taken for this visit. , BMI There is no height or weight on file to calculate BMI. Constitutional:  oriented to person, place, and time. No distress. frail HENT:  Head: Normocephalic and atraumatic.  Eyes:  no discharge. No scleral icterus.  Neck: Normal range of motion. Neck supple. No JVD present.  Cardiovascular: 3/6 systolic ejection murmur heard right sternal border Pulmonary/Chest: Effort normal and breath sounds normal. No stridor. No respiratory distress.  no wheezes.  no rales.  no tenderness.  Abdominal: Soft.  no distension.  no tenderness.  Musculoskeletal: Normal range of motion.  no  tenderness or deformity.  Neurological:  normal muscle tone. Coordination normal. No atrophy Skin: Skin is warm and dry. No rash noted. not diaphoretic.  Psychiatric:  normal mood and affect. behavior is normal. Thought content normal.    Recent Labs: 07/24/2018: B Natriuretic Peptide 623.0 08/25/2018: ALT 11 08/26/2018: Hemoglobin 10.6; Platelets 264 08/27/2018: BUN 17; Creatinine, Ser 0.89; Potassium 3.6; Sodium 139    Lipid Panel Lab Results  Component Value Date   CHOL 125 05/27/2017   HDL 54.70 05/27/2017   LDLCALC 60 05/27/2017   TRIG 52.0 05/27/2017      Wt Readings from Last 3 Encounters:  12/14/18 141 lb 3.2 oz (64 kg)  10/06/18 132 lb (59.9 kg)  10/06/18 132 lb 6 oz (60 kg)     ASSESSMENT AND PLAN:   Atherosclerosis of native coronary artery of native heart with angina pectoris with documented spasm ([place]) - Plan: EKG 12-Lead Some shortness of breath, suspect secondary to severe aortic valve stenosis Recommended echocardiogram followed by cardiac catheterization in preparation for evaluation of her aortic valve  Mixed hyperlipidemia Cholesterol is at goal on the current lipid regimen. No changes to the medications were made. Stable  Nonrheumatic aortic valve stenosis Aortic valve stenosis, mild to  moderate in April 2016 Severe on echocardiogram last year Repeat echocardiogram ordered for further visualization and confirmation Recommended to family she would likely need cardiac catheterization following echocardiogram with follow-up in [facility] to discuss options for her aortic valve Suspect given mild memory issues and frail nature age 18 she would be a candidate for TAVR opefully this would help her shortness of breath which is her main complaint  Memory loss/dementia Symptoms seem stable, mild on today's visit  Chronic cough Long history of suspected postnasal drip been spitting into a cup Mild symptoms   COPD exacerbation (HCC) No recent COPD exacerbation Daughter reports her breathing has been stable  Long discussion with patient and daughter concerning aortic valve disease and need for further workup and procedures  Total encounter time more than 45 minutes  Greater than 50% was spent in counseling and coordination of care with the patient   Disposition:   F/U  3 months   No orders of the defined types were placed in this encounter.    Signed, Esmond Plants, M.D., Ph.D. 12/14/2018  [place], [place]

## 2018-12-14 NOTE — ED Triage Notes (Signed)
Pt arrives to ED from home with complaints of shortness of breath since this evening. EMS reports pt called EMS for short of breath. Pt did home neb tx before fire came, SpO2@77 %RA. Fire gave neb tx. When EMS came she was still low saturation, CPAP initiated. Pt was given total 125 solumedrol, 10mg  albuterol, 0.5mg  atrovent. Pt has no complaints of chest pain. Pt does not look distressed and states she is feeling much better. Pt states she sometimes uses O2 @ night when she sleeps. Pt placed on 2L O2 currently. Pt placed in position of comfort with bed locked and lowered, call bell in reach.

## 2018-12-14 NOTE — Progress Notes (Signed)
[facility] Consultation     Date: 12/14/2018,   MRN# [id number] Jessica [name] [birth year]  Former [facility] Patient  PFT's 01/2015 Ratio 54%, Fev1 65%, DLCO 64% TLC 101%, RV 119% Interpretation: moderate COPD with BD response with hyperinflation and air trapping with diffusion impairment  PFT 03/31/2016 Ratio 52% FEV1 72% DLCO 70% TLC 88% RV 90% Interpretation: mild/moderate obstructive disease with BD response  ONO shows hypoxia-oxygen ordered  CHIEF COMPLAINT:   Follow up COPD   HISTORY OF PRESENT ILLNESS   85 yo white female former smoker, quit 35 years ago, has dx of COPD  Takes NEB therapy now Increased WOB and DOE  Has intremittent wheezing Has intermittent cough with chest congestion  ECHO shows Severe AS Findings shared with patient  Overall poor prognosis   No severe resp distress No signs of infection  On oxygen -needs this to survive Current Medication:   Current Outpatient Medications:  .  albuterol (PROVENTIL) (2.5 MG/3ML) 0.083% nebulizer solution, Take 3 mLs (2.5 mg total) by nebulization every 4 (four) hours as needed for wheezing or shortness of breath. DX: COPD J44.9, Disp: 75 mL, Rfl: 12 .  ALPRAZolam (XANAX) 0.5 MG tablet, Take 1 tablet (0.5 mg total) by mouth at bedtime as needed for anxiety., Disp: 30 tablet, Rfl: 2 .  arformoterol (BROVANA) 15 MCG/2ML NEBU, Take 2 mLs (15 mcg total) by nebulization 2 (two) times daily. DX: COPD J44.9 (Patient taking differently: Take 15 mcg by nebulization 2 (two) times daily. ), Disp: 120 mL, Rfl: 12 .  budesonide (PULMICORT) 0.5 MG/2ML nebulizer solution, Take 2 mLs (0.5 mg total) by nebulization 2 (two) times daily. DX: COPD J44.9, Disp: 120 mL, Rfl: 12 .  Cholecalciferol (VITAMIN D) 1000 UNITS capsule, Take 1,000 Units by mouth daily.  , Disp: , Rfl:  .  clopidogrel (PLAVIX) 75 MG tablet, Take 1 tablet (75 mg total) by mouth daily., Disp: 30 tablet, Rfl: 11 .  ELIQUIS 5 MG TABS  tablet, TAKE 1 TABLET BY MOUTH TWICE A DAY, Disp: 60 tablet, Rfl: 5 .  escitalopram (LEXAPRO) 20 MG tablet, Take 1 tablet (20 mg total) by mouth daily., Disp: 90 tablet, Rfl: 1 .  multivitamin-lutein (OCUVITE-LUTEIN) CAPS capsule, Take 1 capsule by mouth daily., Disp: , Rfl:  .  simvastatin (ZOCOR) 20 MG tablet, Take 1 tablet (20 mg total) by mouth at bedtime., Disp: 90 tablet, Rfl: 3 .  traZODone (DESYREL) 50 MG tablet, Take 0.5-1 tablets (25-50 mg total) by mouth at bedtime as needed for sleep., Disp: 90 tablet, Rfl: 0 .  triamcinolone cream (KENALOG) 0.1 %, Apply 1 application topically 2 (two) times daily., Disp: 45 g, Rfl: 0 .  VENTOLIN HFA 108 (90 Base) MCG/ACT inhaler, INHALE 2 PUFFS EVERY 6HRS AS NEEDED FOR WHEEZING OR SHORTNESS OF BREATH. DO NOT USE IF USING NEBS., Disp: 18 Inhaler, Rfl: 2     ALLERGIES   Contrast media [iodinated diagnostic agents] and Iohexol     REVIEW OF SYSTEMS   Review of Systems  Constitutional: Negative for chills, fever, malaise/fatigue and weight loss.  HENT: Negative for congestion and hearing loss.   Respiratory: Positive for cough and sputum production. Negative for hemoptysis, shortness of breath and wheezing.        Chronic cough and chronic sputum production  Cardiovascular: Negative for chest pain, palpitations, orthopnea and leg swelling.  Gastrointestinal: Negative for heartburn and nausea.  Skin: Negative for rash.  Neurological: Negative for dizziness and headaches.  All  other systems reviewed and are negative.  BP 118/68 (BP Location: Left Arm, Cuff Size: Normal)   Pulse 93   Ht 5\' 4"  (1.626 m)   Wt 141 lb 3.2 oz (64 kg)   SpO2 93%   BMI 24.24 kg/m    PHYSICAL EXAM   Physical Exam  Constitutional: She is oriented to person, place, and time. No distress.  Cardiovascular: Normal rate and regular rhythm.  Murmur heard. Pulmonary/Chest: Effort normal and breath sounds normal. No stridor. No respiratory distress. She has no  wheezes. She has no rales.  Abdominal: Soft.  Neurological: She is alert and oriented to person, place, and time. No cranial nerve deficit.  Skin: Skin is warm. She is not diaphoretic.  Psychiatric: She has a normal mood and affect.       ASSESSMENT/PLAN   85 yo white female with Mild/Moderate COPD Gold Stage C  With chronic hypoxic resp failure, patient very frail, in the setting of severe Aortic stenosis   Moderate COPD GOLD STAGE D Progressive SOB and DOE Switched to NEB therapy at last OV Continue Pulmicort, Brovana NEBS Albuterol as needed Doing OK with regimen   Chest congestion and cough incentive spirometry and flutter valve ordered   Chronic resp failure Hypoxia from COPD Continue oxygen as prescribed Using and benefiting at night    SEVERE AS Follow up cardiology   Prognosis is poor    Corrin Parker, M.D.  [facility]  Medical Director [place] Director [facility] Cardio-Pulmonary Department

## 2018-12-14 NOTE — Patient Instructions (Addendum)
Continue NEB therapy as prescribed  Continue oxygen as prescribed  Flutter Valve and Incentive spirometry 10 -15 times per day   Follow up Cardiology for Aortic Stenosis

## 2018-12-14 NOTE — Progress Notes (Signed)
Patient arrives by EMS on CPAP. Placed on BIPAP. Patient states she feels better. Slight WOB noticed.

## 2018-12-15 ENCOUNTER — Ambulatory Visit: Payer: Self-pay | Admitting: Cardiovascular Disease

## 2018-12-15 DIAGNOSIS — I493 Ventricular premature depolarization: Secondary | ICD-10-CM | POA: Diagnosis present

## 2018-12-15 DIAGNOSIS — F411 Generalized anxiety disorder: Secondary | ICD-10-CM | POA: Diagnosis present

## 2018-12-15 DIAGNOSIS — H9190 Unspecified hearing loss, unspecified ear: Secondary | ICD-10-CM | POA: Diagnosis present

## 2018-12-15 DIAGNOSIS — Z7902 Long term (current) use of antithrombotics/antiplatelets: Secondary | ICD-10-CM | POA: Diagnosis not present

## 2018-12-15 DIAGNOSIS — N183 Chronic kidney disease, stage 3 unspecified: Secondary | ICD-10-CM | POA: Diagnosis present

## 2018-12-15 DIAGNOSIS — Z86711 Personal history of pulmonary embolism: Secondary | ICD-10-CM | POA: Diagnosis not present

## 2018-12-15 DIAGNOSIS — G4700 Insomnia, unspecified: Secondary | ICD-10-CM | POA: Diagnosis present

## 2018-12-15 DIAGNOSIS — I2510 Atherosclerotic heart disease of native coronary artery without angina pectoris: Secondary | ICD-10-CM | POA: Diagnosis present

## 2018-12-15 DIAGNOSIS — I5032 Chronic diastolic (congestive) heart failure: Secondary | ICD-10-CM | POA: Diagnosis present

## 2018-12-15 DIAGNOSIS — J9621 Acute and chronic respiratory failure with hypoxia: Secondary | ICD-10-CM

## 2018-12-15 DIAGNOSIS — R0602 Shortness of breath: Secondary | ICD-10-CM | POA: Diagnosis not present

## 2018-12-15 DIAGNOSIS — I5033 Acute on chronic diastolic (congestive) heart failure: Secondary | ICD-10-CM | POA: Diagnosis not present

## 2018-12-15 DIAGNOSIS — Z7901 Long term (current) use of anticoagulants: Secondary | ICD-10-CM | POA: Diagnosis not present

## 2018-12-15 DIAGNOSIS — J441 Chronic obstructive pulmonary disease with (acute) exacerbation: Secondary | ICD-10-CM | POA: Diagnosis not present

## 2018-12-15 DIAGNOSIS — I2699 Other pulmonary embolism without acute cor pulmonale: Secondary | ICD-10-CM | POA: Diagnosis present

## 2018-12-15 DIAGNOSIS — I350 Nonrheumatic aortic (valve) stenosis: Secondary | ICD-10-CM | POA: Diagnosis present

## 2018-12-15 DIAGNOSIS — Z955 Presence of coronary angioplasty implant and graft: Secondary | ICD-10-CM | POA: Diagnosis not present

## 2018-12-15 DIAGNOSIS — F32A Depression, unspecified: Secondary | ICD-10-CM | POA: Diagnosis present

## 2018-12-15 DIAGNOSIS — I700 Atherosclerosis of aorta: Secondary | ICD-10-CM | POA: Diagnosis present

## 2018-12-15 DIAGNOSIS — F329 Major depressive disorder, single episode, unspecified: Secondary | ICD-10-CM | POA: Diagnosis present

## 2018-12-15 DIAGNOSIS — E785 Hyperlipidemia, unspecified: Secondary | ICD-10-CM

## 2018-12-15 DIAGNOSIS — I872 Venous insufficiency (chronic) (peripheral): Secondary | ICD-10-CM | POA: Diagnosis present

## 2018-12-15 DIAGNOSIS — M797 Fibromyalgia: Secondary | ICD-10-CM | POA: Diagnosis present

## 2018-12-15 DIAGNOSIS — E559 Vitamin D deficiency, unspecified: Secondary | ICD-10-CM | POA: Diagnosis present

## 2018-12-15 DIAGNOSIS — Z9981 Dependence on supplemental oxygen: Secondary | ICD-10-CM | POA: Diagnosis not present

## 2018-12-15 DIAGNOSIS — Z87891 Personal history of nicotine dependence: Secondary | ICD-10-CM | POA: Diagnosis not present

## 2018-12-15 DIAGNOSIS — J690 Pneumonitis due to inhalation of food and vomit: Secondary | ICD-10-CM | POA: Diagnosis present

## 2018-12-15 DIAGNOSIS — J9610 Chronic respiratory failure, unspecified whether with hypoxia or hypercapnia: Secondary | ICD-10-CM | POA: Diagnosis present

## 2018-12-15 DIAGNOSIS — R05 Cough: Secondary | ICD-10-CM | POA: Diagnosis not present

## 2018-12-15 DIAGNOSIS — Z86718 Personal history of other venous thrombosis and embolism: Secondary | ICD-10-CM | POA: Diagnosis not present

## 2018-12-15 LAB — BASIC METABOLIC PANEL
ANION GAP: 9 (ref 5–15)
BUN: 15 mg/dL (ref 8–23)
CO2: 27 mmol/L (ref 22–32)
Calcium: 9.1 mg/dL (ref 8.9–10.3)
Chloride: 103 mmol/L (ref 98–111)
Creatinine, Ser: 0.93 mg/dL (ref 0.44–1.00)
GFR calc Af Amer: >60 mL/min (ref >60)
GFR calc non Af Amer: 54 mL/min — ABNORMAL LOW (ref >60)
Glucose, Bld: 137 mg/dL — ABNORMAL HIGH (ref 70–99)
Potassium: 3.8 mmol/L (ref 3.5–5.1)
Sodium: 139 mmol/L (ref 135–145)

## 2018-12-15 LAB — GROUP A STREP BY PCR: Group A Strep by PCR: NOT DETECTED

## 2018-12-15 LAB — MAGNESIUM: MAGNESIUM: 1.6 mg/dL — AB (ref 1.7–2.4)

## 2018-12-15 LAB — BRAIN NATRIURETIC PEPTIDE: B Natriuretic Peptide: 786.4 pg/mL — ABNORMAL HIGH (ref 0.0–100.0)

## 2018-12-15 LAB — INFLUENZA PANEL BY PCR (TYPE A & B)
Influenza A By PCR: NEGATIVE
Influenza B By PCR: NEGATIVE

## 2018-12-15 MED ORDER — SIMVASTATIN 20 MG PO TABS
20.0000 mg | ORAL_TABLET | Freq: Every day | ORAL | Status: DC
  Administered 2018-12-15 – 2018-12-17 (×3): 20 mg via ORAL
  Filled 2018-12-15 (×3): qty 1

## 2018-12-15 MED ORDER — APIXABAN 5 MG PO TABS
5.0000 mg | ORAL_TABLET | Freq: Two times a day (BID) | ORAL | Status: DC
  Administered 2018-12-15 – 2018-12-18 (×7): 5 mg via ORAL
  Filled 2018-12-15 (×9): qty 1

## 2018-12-15 MED ORDER — OCUVITE-LUTEIN PO CAPS
1.0000 | ORAL_CAPSULE | Freq: Every day | ORAL | Status: DC
  Filled 2018-12-15: qty 1

## 2018-12-15 MED ORDER — LEVALBUTEROL HCL 1.25 MG/0.5ML IN NEBU
1.2500 mg | INHALATION_SOLUTION | Freq: Three times a day (TID) | RESPIRATORY_TRACT | Status: DC
  Administered 2018-12-15 – 2018-12-17 (×5): 1.25 mg via RESPIRATORY_TRACT
  Filled 2018-12-15 (×5): qty 0.5

## 2018-12-15 MED ORDER — HYDROXYZINE HCL 10 MG PO TABS
10.0000 mg | ORAL_TABLET | Freq: Three times a day (TID) | ORAL | Status: DC | PRN
  Filled 2018-12-15: qty 1

## 2018-12-15 MED ORDER — METOPROLOL TARTRATE 12.5 MG HALF TABLET
12.5000 mg | ORAL_TABLET | Freq: Two times a day (BID) | ORAL | Status: DC
  Administered 2018-12-15 – 2018-12-18 (×6): 12.5 mg via ORAL
  Filled 2018-12-15 (×7): qty 1

## 2018-12-15 MED ORDER — AZITHROMYCIN 500 MG PO TABS
250.0000 mg | ORAL_TABLET | Freq: Every day | ORAL | Status: DC
  Administered 2018-12-16: 250 mg via ORAL
  Filled 2018-12-15 (×2): qty 1

## 2018-12-15 MED ORDER — ALPRAZOLAM 0.5 MG PO TABS
0.5000 mg | ORAL_TABLET | Freq: Every evening | ORAL | Status: DC | PRN
  Administered 2018-12-15 – 2018-12-17 (×3): 0.5 mg via ORAL
  Filled 2018-12-15 (×3): qty 1

## 2018-12-15 MED ORDER — IPRATROPIUM BROMIDE 0.02 % IN SOLN
0.5000 mg | RESPIRATORY_TRACT | Status: DC
  Administered 2018-12-15 (×3): 0.5 mg via RESPIRATORY_TRACT
  Filled 2018-12-15 (×3): qty 2.5

## 2018-12-15 MED ORDER — IPRATROPIUM-ALBUTEROL 0.5-2.5 (3) MG/3ML IN SOLN: 3.0000 mL | RESPIRATORY_TRACT | Status: DC | PRN

## 2018-12-15 MED ORDER — TRIAMCINOLONE ACETONIDE 0.1 % EX CREA
1.0000 "application " | TOPICAL_CREAM | Freq: Two times a day (BID) | CUTANEOUS | Status: DC
  Administered 2018-12-15 – 2018-12-18 (×6): 1 via TOPICAL
  Filled 2018-12-15: qty 15

## 2018-12-15 MED ORDER — DM-GUAIFENESIN ER 30-600 MG PO TB12: 1.0000 | ORAL_TABLET | Freq: Two times a day (BID) | ORAL | Status: DC | PRN

## 2018-12-15 MED ORDER — CLOPIDOGREL BISULFATE 75 MG PO TABS
75.0000 mg | ORAL_TABLET | Freq: Every day | ORAL | Status: DC
  Administered 2018-12-15 – 2018-12-18 (×4): 75 mg via ORAL
  Filled 2018-12-15 (×5): qty 1

## 2018-12-15 MED ORDER — FUROSEMIDE 10 MG/ML IJ SOLN
20.0000 mg | Freq: Every day | INTRAMUSCULAR | Status: DC
  Administered 2018-12-15 – 2018-12-17 (×3): 20 mg via INTRAVENOUS
  Filled 2018-12-15 (×2): qty 2
  Filled 2018-12-15: qty 4

## 2018-12-15 MED ORDER — LEVALBUTEROL HCL 1.25 MG/0.5ML IN NEBU: 1.2500 mg | INHALATION_SOLUTION | Freq: Four times a day (QID) | RESPIRATORY_TRACT | Status: DC

## 2018-12-15 MED ORDER — ARFORMOTEROL TARTRATE 15 MCG/2ML IN NEBU
15.0000 ug | INHALATION_SOLUTION | Freq: Two times a day (BID) | RESPIRATORY_TRACT | Status: DC
  Administered 2018-12-15 – 2018-12-18 (×7): 15 ug via RESPIRATORY_TRACT
  Filled 2018-12-15 (×9): qty 2

## 2018-12-15 MED ORDER — AZITHROMYCIN 250 MG PO TABS
500.0000 mg | ORAL_TABLET | Freq: Every day | ORAL | Status: AC
  Administered 2018-12-15: 500 mg via ORAL
  Filled 2018-12-15: qty 2

## 2018-12-15 MED ORDER — ACETAMINOPHEN 325 MG PO TABS: 650.0000 mg | ORAL_TABLET | Freq: Four times a day (QID) | ORAL | Status: DC | PRN

## 2018-12-15 MED ORDER — IPRATROPIUM BROMIDE 0.02 % IN SOLN
0.5000 mg | Freq: Three times a day (TID) | RESPIRATORY_TRACT | Status: DC
  Administered 2018-12-15 – 2018-12-17 (×6): 0.5 mg via RESPIRATORY_TRACT
  Filled 2018-12-15 (×6): qty 2.5

## 2018-12-15 MED ORDER — LEVALBUTEROL HCL 1.25 MG/0.5ML IN NEBU
1.2500 mg | INHALATION_SOLUTION | Freq: Four times a day (QID) | RESPIRATORY_TRACT | Status: DC
  Administered 2018-12-15 (×3): 1.25 mg via RESPIRATORY_TRACT
  Filled 2018-12-15 (×4): qty 0.5

## 2018-12-15 MED ORDER — METHYLPREDNISOLONE SODIUM SUCC 125 MG IJ SOLR
60.0000 mg | Freq: Two times a day (BID) | INTRAMUSCULAR | Status: DC
  Administered 2018-12-15 – 2018-12-16 (×3): 60 mg via INTRAVENOUS
  Filled 2018-12-15 (×3): qty 2

## 2018-12-15 MED ORDER — APIXABAN 5 MG PO TABS: 5.0000 mg | ORAL_TABLET | Freq: Two times a day (BID) | ORAL | Status: DC

## 2018-12-15 MED ORDER — TRAZODONE HCL 50 MG PO TABS: 25.0000 mg | ORAL_TABLET | Freq: Every evening | ORAL | Status: DC | PRN

## 2018-12-15 MED ORDER — ESCITALOPRAM OXALATE 20 MG PO TABS
20.0000 mg | ORAL_TABLET | Freq: Every day | ORAL | Status: DC
  Administered 2018-12-15 – 2018-12-18 (×4): 20 mg via ORAL
  Filled 2018-12-15 (×4): qty 1
  Filled 2018-12-15: qty 2

## 2018-12-15 MED ORDER — VITAMIN D 25 MCG (1000 UNIT) PO TABS
1000.0000 [IU] | ORAL_TABLET | Freq: Every day | ORAL | Status: DC
  Administered 2018-12-16 – 2018-12-18 (×3): 1000 [IU] via ORAL
  Filled 2018-12-15 (×4): qty 1

## 2018-12-15 MED ORDER — ENOXAPARIN SODIUM 40 MG/0.4ML ~~LOC~~ SOLN: 40.0000 mg | SUBCUTANEOUS | Status: DC

## 2018-12-15 NOTE — ED Notes (Signed)
Breakfast Tray Ordered. 

## 2018-12-15 NOTE — Progress Notes (Signed)
RT Note:  Bipap not being used. Patient is on 3L  at this time.  Will Continue to monitor.  No distress noted.

## 2018-12-15 NOTE — Progress Notes (Signed)
PROGRESS NOTE  Jessica [name] [facility] HAL:[id number] DOB: [birth year] DOA: 12/14/2018 PCP: [name], [name], MD  Brief History   The patient is a 85 yr old woman who presented to [facility] ED with complaints of shortness of breath. Per patient's daughter, patient has been having shortness of breath for almost 3 weeks, which has been progressively getting worse.  Patient has a dry cough, but no chest pain.  Denies fever or chills.  Patient had oxygen desaturation to 77% on room air in the ED.  Denies nausea vomiting, diarrhea, abdominal pain, symptoms of UTI or unilateral weakness.  No runny nose or sore throat.  The patient carries a past medical history significant for COPD, Chronic respiratory failure requiring 2L O2 for sleep, hyperlipidemia, CHF (DD), CAD s/p stent placement, fibromyalgia, venous insufficiency, DVT/PE on Eliquis, CKD 3, aortic stenosis.  In the ED the patient the patient presented with a temperature of 98.1, Respiratory rate of 29, heart rate is 100, blood pressure of 121/67, and SaO2 of 91% on 2L bynasal cannula. She was then transferred to BIPAP.  Lab in the ED revealed a WBC of 5.4; troponins were negative; BNP was elevated at 786.4, creatinine was 1.07, and BUN was 11. CXR demonstrated cardiomegaly and vascular congestion.  The patient has been admitted to a telemetry bed. She is receiving IV lasix, brovana, mucinex, Zithromax, atrovent, and xopenex, as well as IV solumedrol.  The patient states that she is feeling somewhat better. A & P   Assessment/Plan Principal Problem:   COPD exacerbation (HCC) Active Problems:   Hyperlipidemia   Generalized anxiety disorder   PE (pulmonary thromboembolism) (HCC)   Acute on chronic respiratory failure with hypoxia (HCC)   Depression   CAD (coronary artery disease)   CKD (chronic kidney disease), stage III (HCC)   Acute on chronic diastolic (congestive) heart failure (HCC)   Acute on chronic respiratory failure with hypoxia due to  combination of COPD exacerbation and CHF exacerbation: Patient has a cough, wheezing on auscultation and shortness of breath, no infiltration on chest x-ray, clinically consistent with COPD exacerbation.  Patient has elevated BNP 784.4, and cardiomegaly and vascular congestion, indicating CHF exacerbation.  -Admitted to stepdown as inpatient -Continue BiPAP - Treat COPD and CHF exacerbation as below respectively.  COPD exacerbation:  -Nebulizers: scheduled Atrovent and prn Xopenex Nebs -Solu-Medrol 60 mg IV bid -Z pak -Mucinex for cough  -Incentive spirometry -Follow up blood culture x2, sputum culture,  Flu pcr  Acute on chronic diastolic (congestive) heart failur: 2D echo 06/10/2018 showed EF of 55 to 60% with grade 1 diastolic dysfunction.  Patient has positive JVD and elevated BNP 786.4.  No chest pain. - Start Lasix 20 mg daily  Hyperlipidemia: -zocor  Hx of PE/DVT:  -continue Eliquis  CAD: s/p of stent. No CP -contiue plavix ande zocopr  Depression and anxiety: Stable, no suicidal or homicidal ideations. -Continue home medications   CKD (chronic kidney disease), stage III ([place]): stable. cre 1.8m BUN 11 -f/u by BMP  DVT prophylaxis: Eliquis Code Status: Full Code Family Communication: No family at bedside Disposition Plan: Anticipate discharge back to previous home environment   Yonah Tangeman, DO [facility] Direct contact: see [email or web address]  7PM-7AM contact night coverage as above 12/15/2018, 2:05 PM  LOS: 0 days    Antibiotics  . Azithromycin  Interval History/Subjective  The patient has been admitted to a telemetry bed. She is receiving IV lasix, brovana, mucinex, Zithromax, atrovent, and xopenex, as well as IV solumedrol.  She states that she is feeling somewhat better. No new complaints.  Objective   Vitals:  Vitals:   12/15/18 1033 12/15/18 1104  BP: 121/72 126/87  Pulse: 96 97  Resp: 16 (!) 21  Temp:    SpO2: 98% 98%     Exam:  Constitutional:  . The patient is elderly, weak, and frail. She is awake, alert, and oriented x 3. No acute distress. . Neck:  . neck appears normal, no masses, normal ROM, supple . no thyromegaly . Radiation of heart murmur to carotids bilaterally. Respiratory:  . Diminished breath sounds and wheezes present bilaterally. No rhonchi. No rales. . No increased work of breathing. No tactile fremitus. Cardiovascular:  . Regular rate and rhythm. No lateral PMI or thrills. Positive for 4/6 SEM with radiation to carotids bilaterally. . No LE extremity edema   . Diminished pedal pulses Abdomen:  . Abdomen appears normal; no tenderness or masses . No hernias . No HSM Musculoskeletal:  . No cyanosis, clubbing or edema Skin:  . No rashes, lesions, ulcers . palpation of skin: no induration or nodules Neurologic:  . CN 2-12 intact . Sensation all 4 extremities intact Psychiatric:  . Mental status o Mood, affect appropriate o Orientation to person, place, time    I have personally reviewed the following:   Today's Data  . Vitals  Lab Data  . Cbc BMP from 12/14/2018.  Micro Data  . Group A strep negative by PCR. Flu A & B negative.  Imaging  . CXR, vascular congestion.   Scheduled Meds: . apixaban  5 mg Oral BID  . arformoterol  15 mcg Nebulization BID  . [START ON 12/16/2018] azithromycin  250 mg Oral Daily  . [START ON 12/16/2018] cholecalciferol  1,000 Units Oral Daily  . clopidogrel  75 mg Oral Daily  . escitalopram  20 mg Oral Daily  . furosemide  20 mg Intravenous Daily  . ipratropium  0.5 mg Nebulization TID  . levalbuterol  1.25 mg Nebulization Q6H WA  . methylPREDNISolone (SOLU-MEDROL) injection  60 mg Intravenous Q12H  . [START ON 12/16/2018] multivitamin-lutein  1 capsule Oral Daily  . simvastatin  20 mg Oral QHS  . triamcinolone cream  1 application Topical BID   Continuous Infusions:  Principal Problem:   COPD exacerbation (HCC) Active  Problems:   Hyperlipidemia   Generalized anxiety disorder   PE (pulmonary thromboembolism) (HCC)   Acute on chronic respiratory failure with hypoxia (HCC)   Depression   CAD (coronary artery disease)   CKD (chronic kidney disease), stage III (HCC)   Acute on chronic diastolic (congestive) heart failure ([place])   LOS: 0 days

## 2018-12-15 NOTE — ED Notes (Signed)
Admitting at bedside 

## 2018-12-15 NOTE — ED Notes (Signed)
Pt taken off bipap and placed on 3L.  

## 2018-12-15 NOTE — Progress Notes (Signed)
Paged MD about patient have 15 beat of wide qrst complex. Will repage in 15 mins.

## 2018-12-15 NOTE — Progress Notes (Signed)
Patient back on BIPAP due to increased WOB

## 2018-12-15 NOTE — H&P (Signed)
History and Physical    [name] [facility] KGM:[id number] DOB: 1926/01/23 DOA: 12/14/2018  Referring MD/NP/PA:   PCP: [name], [name], MD   Patient coming from:  The patient is coming from home.  At baseline, pt is independent for most of ADL.        Chief Complaint: Shortness of breath  HPI: Jessica [name] is a 85 y.o. female with medical history significant of COPD on 2 L nasal cannula oxygen at night, hyperlipidemia, CAD, dCHF, stent placement, fibromyalgia, venous insufficiency, DVT/PE on Eliquis, CKD 3, aortic stenosis, who presents with shortness of breath.  Per patient's daughter, patient has been having shortness of breath for almost 3 weeks, which has been progressively getting worse.  Patient has a dry cough, but no chest pain.  Denies fever or chills.  Patient had oxygen desaturation to 77% on room air in the ED.  Denies nausea vomiting, diarrhea, abdominal pain, symptoms of UTI or unilateral weakness.  No runny nose or sore throat.  ED Course: pt was found to have WBC 5.4, negative troponin, BNP 786.4, creatinine 1.07, BUN 11, chest x-ray showed cardiomegaly and vascular congestion, temperature normal, tachycardia, tachypnea,   Review of Systems:   General: no fevers, chills, no body weight gain, has fatigue HEENT: no blurry vision, hearing changes or sore throat Respiratory: has dyspnea, coughing, wheezing CV: no chest pain, no palpitations GI: no nausea, vomiting, abdominal pain, diarrhea, constipation GU: no dysuria, burning on urination, increased urinary frequency, hematuria  Ext: has mild leg edema Neuro: no unilateral weakness, numbness, or tingling, no vision change or hearing loss Skin: no rash, no skin tear. MSK: No muscle spasm, no deformity, no limitation of range of movement in spin Heme: No easy bruising.  Travel history: No recent long distant travel.  Allergy:  Allergies  Allergen Reactions  . Contrast Media [Iodinated Diagnostic Agents] Itching, Rash  and Other (See Comments)    Hypotension, Skin turns red like a sunburn  . Iohexol Itching, Rash and Other (See Comments)    Hypotension, Skin turns red like a sunburn    Past Medical History:  Diagnosis Date  . Anxiety   . congenital nystagmus   . COPD (chronic obstructive pulmonary disease) ([place])   . Coronary artery disease   . DJD (degenerative joint disease)   . Fibromyalgia   . Low back pain syndrome   . Memory loss   . Other and unspecified hyperlipidemia   . Severe aortic stenosis   . Venous insufficiency     Past Surgical History:  Procedure Laterality Date  . ABDOMINAL HYSTERECTOMY    . ANTERIOR CERVICAL DISCECTOMY    . CARPAL TUNNEL RELEASE  12/2011   right arm  . CATARACT EXTRACTION    . CORONARY ATHERECTOMY  07/23/2018   PTCA/orbital atherectomy/DES x 1 proximal to mid LAD  . CORONARY ATHERECTOMY N/A 07/23/2018   Procedure: CORONARY ATHERECTOMY;  Surgeon: Burnell Blanks, MD;  Location: [place] [facility];  Service: Cardiovascular;  Laterality: N/A;  . CORONARY STENT INTERVENTION    . CORONARY STENT INTERVENTION N/A 07/23/2018   Procedure: CORONARY STENT INTERVENTION;  Surgeon: Burnell Blanks, MD;  Location: [place] [facility];  Service: Cardiovascular;  Laterality: N/A;  . LUMBAR LAMINECTOMY    . right knee arthroscopy    . right shoulder replacement  04/2009  . right shoulder surgery  2008   Dr. Noemi Chapel  . RIGHT/LEFT HEART CATH AND CORONARY ANGIOGRAPHY N/A 07/07/2018   Procedure: RIGHT/LEFT HEART  CATH AND CORONARY ANGIOGRAPHY;  Surgeon: Burnell Blanks, MD;  Location: [place] [facility];  Service: Cardiovascular;  Laterality: N/A;  . ULTRASOUND GUIDANCE FOR VASCULAR ACCESS  07/07/2018   Procedure: Ultrasound Guidance For Vascular Access;  Surgeon: Burnell Blanks, MD;  Location: [place] [facility];  Service: Cardiovascular;;    Social History:  reports that she quit smoking about 27 years ago. Her smoking use included cigarettes.  She has a 60.00 pack-year smoking history. She has never used smokeless tobacco. She reports current alcohol use. She reports that she does not use drugs.  Family History:  Family History  Problem Relation Age of Onset  . Other Mother        broken hip  . Other Father        kidney problems     Prior to Admission medications   Medication Sig Start Date End Date Taking? Authorizing Provider  albuterol (PROVENTIL) (2.5 MG/3ML) 0.083% nebulizer solution Take 3 mLs (2.5 mg total) by nebulization every 4 (four) hours as needed for wheezing or shortness of breath. DX: COPD J44.9 04/12/18  Yes Kasa, Maretta Bees, MD  ALPRAZolam Duanne Moron) 0.5 MG tablet Take 1 tablet (0.5 mg total) by mouth at bedtime as needed for anxiety. 10/06/18  Yes [name], [name], MD  arformoterol (BROVANA) 15 MCG/2ML NEBU Take 2 mLs (15 mcg total) by nebulization 2 (two) times daily. DX: COPD J44.9 Patient taking differently: Take 15 mcg by nebulization 2 (two) times daily.  07/27/17  Yes Kasa, Maretta Bees, MD  budesonide (PULMICORT) 0.5 MG/2ML nebulizer solution Take 2 mLs (0.5 mg total) by nebulization 2 (two) times daily. DX: COPD J44.9 07/09/17  Yes Flora Lipps, MD  Cholecalciferol (VITAMIN D) 1000 UNITS capsule Take 1,000 Units by mouth daily.     Yes [provider]  clopidogrel (PLAVIX) 75 MG tablet Take 1 tablet (75 mg total) by mouth daily. 07/13/18 07/13/19 Yes Burnell Blanks, MD  ELIQUIS 5 MG TABS tablet TAKE 1 TABLET BY MOUTH TWICE A DAY 11/19/18  Yes Gollan, Kathlene November, MD  escitalopram (LEXAPRO) 20 MG tablet Take 1 tablet (20 mg total) by mouth daily. 06/30/18  Yes [name], [name], MD  multivitamin-lutein [facility]) CAPS capsule Take 1 capsule by mouth daily.   Yes [provider]  simvastatin (ZOCOR) 20 MG tablet Take 1 tablet (20 mg total) by mouth at bedtime. 12/23/17  Yes [name], MD  traZODone (DESYREL) 50 MG tablet Take 0.5-1 tablets (25-50 mg total) by mouth at bedtime as needed for  sleep. 06/30/18  Yes [name], [name], MD  triamcinolone cream (KENALOG) 0.1 % Apply 1 application topically 2 (two) times daily. 11/12/18  Yes [name], [name], MD  VENTOLIN HFA 108 (90 Base) MCG/ACT inhaler INHALE 2 PUFFS EVERY 6HRS AS NEEDED FOR WHEEZING OR SHORTNESS OF BREATH. DO NOT USE IF USING NEBS. Patient taking differently: Inhale 2 puffs into the lungs every 6 (six) hours as needed for wheezing.  11/17/18  Yes Flora Lipps, MD  AMBULATORY NON FORMULARY MEDICATION Medication Name: Incentive spirometer Use 10-15 times per day 12/14/18   Flora Lipps, MD  Respiratory Therapy Supplies (FLUTTER) DEVI 1 each by Does not apply route daily. 12/14/18   Flora Lipps, MD    Physical Exam: Vitals:   12/15/18 0418 12/15/18 0425 12/15/18 0430 12/15/18 0500  BP:  128/68 128/61 (!) 112/51  Pulse:  97 98 90  Resp:  (!) 21 (!) 25 (!) 23  Temp:  TempSrc:      SpO2: 99%  95% 91%  Height:       General: Not in acute distress HEENT:       Eyes: PERRL, EOMI, no scleral icterus.       ENT: No discharge from the ears and nose, no pharynx injection, no tonsillar enlargement.        Neck: postive JVD, no bruit, no mass felt. Heme: No neck lymph node enlargement. Cardiac: S1/S2, RRR, No murmurs, No gallops or rubs. Respiratory: has wheezing bilaterally. GI: Soft, nondistended, nontender, no rebound pain, no organomegaly, BS present. GU: No hematuria Ext: has trace leg edema bilaterally. 2+DP/PT pulse bilaterally. Musculoskeletal: No joint deformities, No joint redness or warmth, no limitation of ROM in spin. Skin: No rashes.  Neuro: Alert, oriented X3, cranial nerves II-XII grossly intact, moves all extremities normally. Psych: Patient is not psychotic, no suicidal or hemocidal ideation.  Labs on Admission: I have personally reviewed following labs and imaging studies  CBC: Recent Labs  Lab 12/14/18 2237  WBC 5.4  NEUTROABS 3.6  HGB 10.2*  HCT 34.9*  MCV 97.5  PLT 720   Basic Metabolic  Panel: Recent Labs  Lab 12/14/18 2237  NA 140  K 3.5  CL 108  CO2 22  GLUCOSE 124*  BUN 11  CREATININE 1.07*  CALCIUM 8.9   GFR: Estimated Creatinine Clearance: 31.4 mL/min (A) (by C-G formula based on SCr of 1.07 mg/dL (H)). Liver Function Tests: No results for input(s): AST, ALT, ALKPHOS, BILITOT, PROT, ALBUMIN in the last 168 hours. No results for input(s): LIPASE, AMYLASE in the last 168 hours. No results for input(s): AMMONIA in the last 168 hours. Coagulation Profile: No results for input(s): INR, PROTIME in the last 168 hours. Cardiac Enzymes: Recent Labs  Lab 12/14/18 2237  TROPONINI <0.03   BNP (last 3 results) No results for input(s): PROBNP in the last 8760 hours. HbA1C: No results for input(s): HGBA1C in the last 72 hours. CBG: No results for input(s): GLUCAP in the last 168 hours. Lipid Profile: No results for input(s): CHOL, HDL, LDLCALC, TRIG, CHOLHDL, LDLDIRECT in the last 72 hours. Thyroid Function Tests: No results for input(s): TSH, T4TOTAL, FREET4, T3FREE, THYROIDAB in the last 72 hours. Anemia Panel: No results for input(s): VITAMINB12, FOLATE, FERRITIN, TIBC, IRON, RETICCTPCT in the last 72 hours. Urine analysis:    Component Value Date/Time   COLORURINE LT. YELLOW 07/06/2009 1057   APPEARANCEUR CLEAR 07/06/2009 1057   LABSPEC 1.010 07/06/2009 1057   PHURINE 5.0 07/06/2009 1057   GLUCOSEU NEGATIVE 07/06/2009 1057   BILIRUBINUR NEGATIVE 07/06/2009 1057   KETONESUR NEGATIVE 07/06/2009 1057   UROBILINOGEN 0.2 07/06/2009 1057   NITRITE NEGATIVE 07/06/2009 1057   LEUKOCYTESUR SMALL 07/06/2009 1057   Sepsis Labs: @LABRCNTIP (procalcitonin:4,lacticidven:4) )No results found for this or any previous visit (from the past 240 hour(s)).   Radiological Exams on Admission: Dg Chest Port 1 View  Result Date: 12/14/2018 CLINICAL DATA:  Shortness of breath EXAM: PORTABLE CHEST 1 VIEW COMPARISON:  11/09/2014, CT chest 08/24/2018 FINDINGS: Postsurgical  changes in the cervical spine. Right shoulder replacement. Scarring at the CP angles. Mild cardiomegaly with aortic atherosclerosis. Central vascular congestion. No focal airspace disease or pneumothorax. IMPRESSION: No active disease.  Cardiomegaly with central vascular congestion Electronically Signed   By: Donavan Foil M.D.   On: 12/14/2018 22:51     EKG: Independently reviewed.  Sinus rhythm, QTC 501, LAE, LAD, anteroseptal infarction pattern  Assessment/Plan Principal Problem:   COPD  exacerbation ([place]) Active Problems:   Hyperlipidemia   Generalized anxiety disorder   PE (pulmonary thromboembolism) (HCC)   Acute on chronic respiratory failure with hypoxia (HCC)   Depression   CAD (coronary artery disease)   CKD (chronic kidney disease), stage III (H[facility])   Acute on chronic diastolic (congestive) heart failure (HCC)   Acute on chronic respiratory failure with hypoxia due to combination of COPD exacerbation and CHF exacerbation: Patient has a cough, wheezing on auscultation and shortness of breath, no infiltration on chest x-ray, clinically consistent with COPD exacerbation.  Patient has elevated BNP 784.4, and cardiomegaly and vascular congestion, indicating CHF exacerbation.  -Admitted to stepdown as inpatient -Continue BiPAP - Treat COPD and CHF exacerbation as below respectively.  COPD exacerbation:  -Nebulizers: scheduled Atrovent and prn Xopenex Nebs -Solu-Medrol 60 mg IV bid -Z pak -Mucinex for cough  -Incentive spirometry -Follow up blood culture x2, sputum culture,  Flu pcr  Acute on chronic diastolic (congestive) heart failur: 2D echo 06/10/2018 showed EF of 55 to 60% with grade 1 diastolic dysfunction.  Patient has positive JVD and elevated BNP 786.4.  No chest pain. - Start Lasix 20 mg daily  Hyperlipidemia: -zocor  Hx of PE/DVT:  -continue Eliquis  CAD: s/p of stent. No CP -contiue plavix ande zocopr  Depression and anxiety: Stable, no suicidal or homicidal  ideations. -Continue home medications   CKD (chronic kidney disease), stage III ([place]): stable. cre 1.23m BUN 11 -f/u by BMP   Inpatient status:  # Patient requires inpatient status due to high intensity of service, high risk for further deterioration and high frequency of surveillance required.  I certify that at the point of admission it is my clinical judgment that the patient will require inpatient hospital care spanning beyond 2 midnights from the point of admission.  . This patient has multiple chronic comorbidities including COPD on 2 L nasal cannula oxygen at night, hyperlipidemia, CAD, dCHF, stent placement, fibromyalgia, venous insufficiency, DVT/PE on Eliquis, CKD 3, aortic stenosis . Now patient has presenting with acute on chronic respiratory failure with hypoxia due to combination of COPD and CHF exacerbation, requiring BiPAP. Marland Kitchen The worrisome physical exam findings include wheezing on auscultation, positive JVD . The initial radiographic and laboratory data are worrisome because of elevated BNP, cardiomegaly and vascular congestion/pulmonary edema on chest x-ray . Current medical needs: please see my assessment and plan . Predictability of an adverse outcome (risk): Patient has multiple comorbidities, now presents with acute on chronic respiratory failure, requiring BiPAP.  Patient is at high risk for deteriorating.  Will need to be treated in hospital for at least 2 days.   DVT ppx: on Eliquis Code Status: Full code Family Communication:   Yes, patient's daughter at bed side Disposition Plan:  Anticipate discharge back to previous home environment Consults called:  none Admission status: SDU/inpation       Date of Service 12/15/2018    [facility]   If 7PM-7AM, please contact night-coverage [email or web address] Password [facility] 12/15/2018, 6:38 AM

## 2018-12-15 NOTE — Progress Notes (Signed)
Trial off BIPAP. Placed on 3 lpm [place]. No distress noted at this time.

## 2018-12-16 ENCOUNTER — Inpatient Hospital Stay (HOSPITAL_COMMUNITY): Payer: Medicare Other

## 2018-12-16 DIAGNOSIS — I2510 Atherosclerotic heart disease of native coronary artery without angina pectoris: Secondary | ICD-10-CM

## 2018-12-16 DIAGNOSIS — I5033 Acute on chronic diastolic (congestive) heart failure: Secondary | ICD-10-CM

## 2018-12-16 LAB — BASIC METABOLIC PANEL
Anion gap: 9 (ref 5–15)
BUN: 18 mg/dL (ref 8–23)
CHLORIDE: 102 mmol/L (ref 98–111)
CO2: 27 mmol/L (ref 22–32)
Calcium: 9.3 mg/dL (ref 8.9–10.3)
Creatinine, Ser: 0.87 mg/dL (ref 0.44–1.00)
GFR calc Af Amer: >60 mL/min (ref >60)
GFR calc non Af Amer: 59 mL/min — ABNORMAL LOW (ref >60)
Glucose, Bld: 152 mg/dL — ABNORMAL HIGH (ref 70–99)
Potassium: 4.1 mmol/L (ref 3.5–5.1)
Sodium: 138 mmol/L (ref 135–145)

## 2018-12-16 LAB — MAGNESIUM: Magnesium: 1.9 mg/dL (ref 1.7–2.4)

## 2018-12-16 MED ORDER — PANTOPRAZOLE SODIUM 40 MG PO TBEC
40.0000 mg | DELAYED_RELEASE_TABLET | Freq: Every day | ORAL | Status: DC
  Administered 2018-12-16 – 2018-12-17 (×2): 40 mg via ORAL
  Filled 2018-12-16 (×2): qty 1

## 2018-12-16 MED ORDER — GUAIFENESIN ER 600 MG PO TB12
600.0000 mg | ORAL_TABLET | Freq: Two times a day (BID) | ORAL | Status: DC
  Administered 2018-12-16 – 2018-12-18 (×5): 600 mg via ORAL
  Filled 2018-12-16 (×6): qty 1

## 2018-12-16 MED ORDER — BENZONATATE 100 MG PO CAPS: 200.0000 mg | ORAL_CAPSULE | Freq: Three times a day (TID) | ORAL | Status: DC | PRN

## 2018-12-16 MED ORDER — PROSIGHT PO TABS
1.0000 | ORAL_TABLET | Freq: Every day | ORAL | Status: DC
  Administered 2018-12-16 – 2018-12-18 (×3): 1 via ORAL
  Filled 2018-12-16 (×4): qty 1

## 2018-12-16 MED ORDER — RESOURCE THICKENUP CLEAR PO POWD
ORAL | Status: DC | PRN
  Filled 2018-12-16: qty 125

## 2018-12-16 MED ORDER — METHYLPREDNISOLONE SODIUM SUCC 40 MG IJ SOLR
40.0000 mg | Freq: Two times a day (BID) | INTRAMUSCULAR | Status: DC
  Administered 2018-12-16 – 2018-12-18 (×4): 40 mg via INTRAVENOUS
  Filled 2018-12-16 (×4): qty 1

## 2018-12-16 MED ORDER — FLUTICASONE PROPIONATE 50 MCG/ACT NA SUSP
2.0000 | Freq: Every day | NASAL | Status: DC
  Administered 2018-12-16 – 2018-12-18 (×3): 2 via NASAL
  Filled 2018-12-16: qty 16

## 2018-12-16 MED ORDER — LORATADINE 10 MG PO TABS
10.0000 mg | ORAL_TABLET | Freq: Every day | ORAL | Status: DC
  Administered 2018-12-16 – 2018-12-18 (×3): 10 mg via ORAL
  Filled 2018-12-16 (×3): qty 1

## 2018-12-16 NOTE — Progress Notes (Addendum)
Modified Barium Swallow Progress Note  Patient Details  Name: Jessica [name] MRN: [id number] Date of Birth: 1926/09/08  Today's Date: 12/16/2018  Modified Barium Swallow completed.  Full report located under Chart Review in the Imaging Section.  Brief recommendations include the following:  Clinical Impression  Pt presents with moderate pharyngeal dysphagia marked by delayed initiation of swallow at pyriforms/vallecula, inconsistent clearance of penetration/aspiration, and suspicion of esophageal dysfunction. Pt demonstrated congested cough before, during and after study, concerns for mucous build up. Pharyngeal shelf secondary to ACDF hardware observed at baseline which, along with delayed initiation of swallow, led to aspiration/penetration of liquids as it directed bolus straight into laryngeal vestibule. Pt aspirated at and below cords exhibiting immediate hard cough to clear, but despite max effort pt was inconsistently able to fully clear aspirate. Chin tuck and breath hold proved ineffective to increase airway protection. Pilar Plate penetration x3 ocurred with nectar liquids, which pt immediatly ejected with strength of laryngeal closure and independent cough. All solid POs tolerated. Pill consumed with rapid/generous intake of nectar liquid, causing penetration to cords but with immediate ejection. Concerns for esophageal dysfunction with observation of frequent belching and subsequent gurgly cough sounding as if pt was drowning. Recommend dys 2, nectar- thick liquid diet with pt taking small sips of liquid. Diet advancement indicated by reduced coughing during and outside of meals. SLP to f/u for tolerance and use of recommended strategies.    Swallow Evaluation Recommendations       SLP Diet Recommendations: Dysphagia 2 (Fine chop) solids;Nectar thick liquid   Liquid Administration via: Cup;No straw   Medication Administration: Whole meds with puree   Supervision: Full  supervision/cueing for compensatory strategies;Patient able to self feed   Compensations: Slow rate;Small sips/bites   Postural Changes: Seated upright at 90 degrees   Oral Care Recommendations: Oral care BID        Ellis Savage, Student SLP 12/16/2018,3:23 PM

## 2018-12-16 NOTE — Evaluation (Signed)
Clinical/Bedside Swallow Evaluation Patient Details  Name: Jessica [name] MRN: [id number] Date of Birth: [birth year]  Today's Date: 12/16/2018 Time: SLP Start Time (ACUTE ONLY): 37 SLP Stop Time (ACUTE ONLY): 1100 SLP Time Calculation (min) (ACUTE ONLY): 10 min  Past Medical History:  Past Medical History:  Diagnosis Date  . Anxiety   . congenital nystagmus   . COPD (chronic obstructive pulmonary disease) ([place])   . Coronary artery disease   . DJD (degenerative joint disease)   . Fibromyalgia   . Low back pain syndrome   . Memory loss   . Other and unspecified hyperlipidemia   . Severe aortic stenosis   . Venous insufficiency    Past Surgical History:  Past Surgical History:  Procedure Laterality Date  . ABDOMINAL HYSTERECTOMY    . ANTERIOR CERVICAL DISCECTOMY    . CARPAL TUNNEL RELEASE  12/2011   right arm  . CATARACT EXTRACTION    . CORONARY ATHERECTOMY  07/23/2018   PTCA/orbital atherectomy/DES x 1 proximal to mid LAD  . CORONARY ATHERECTOMY N/A 07/23/2018   Procedure: CORONARY ATHERECTOMY;  Surgeon: Burnell Blanks, MD;  Location: [place] [facility];  Service: Cardiovascular;  Laterality: N/A;  . CORONARY STENT INTERVENTION    . CORONARY STENT INTERVENTION N/A 07/23/2018   Procedure: CORONARY STENT INTERVENTION;  Surgeon: Burnell Blanks, MD;  Location: [place] [facility];  Service: Cardiovascular;  Laterality: N/A;  . LUMBAR LAMINECTOMY    . right knee arthroscopy    . right shoulder replacement  04/2009  . right shoulder surgery  2008   Dr. Noemi Chapel  . RIGHT/LEFT HEART CATH AND CORONARY ANGIOGRAPHY N/A 07/07/2018   Procedure: RIGHT/LEFT HEART CATH AND CORONARY ANGIOGRAPHY;  Surgeon: Burnell Blanks, MD;  Location: [place] [facility];  Service: Cardiovascular;  Laterality: N/A;  . ULTRASOUND GUIDANCE FOR VASCULAR ACCESS  07/07/2018   Procedure: Ultrasound Guidance For Vascular Access;  Surgeon: Burnell Blanks, MD;  Location: [place] [facility];  Service: Cardiovascular;;   HPI:  The patient is a 85 yr old woman who presented to [facility] ED with complaints of shortness of breath. PMH  significant for COPD, Chronic respiratory failure requiring 2L O2 for sleep, hyperlipidemia, CHF ([place]), CAD s/p stent placement, fibromyalgia, venous insufficiency, DVT/PE on Eliquis, CKD 3, aortic stenosis. Per MD note "Acute on chronic respiratory failure with hypoxia due to combination of COPD exacerbation and CHF exacerbation: Patient has a cough, wheezing on auscultation and shortness of breath, no infiltration on chest x-ray, clinically consistent with COPD exacerbation." Family reports coughing on liquids at baseline and food being chopped for ease of mastication. Pt reports "I dont usually choke, but I have been recently."   Assessment / Plan / Recommendation Clinical Impression  Pt demonstrates immediate signs concerning for a pharyngeal or more likely cervical esophageal/primary esophageal dysphagia. Sips of water resulted in multiple swallows followed by sound consistent with regurgitation and coughing. Two bites of puree did not elicit any of these signs, but a subsequent sip of water yielded severe cough response. Pt recommended to proceed with MBS for instrumental assessment of swallowing given findings.  SLP Visit Diagnosis: Dysphagia, pharyngoesophageal phase (R13.14)    Aspiration Risk  Severe aspiration risk    Diet Recommendation NPO        Other  Recommendations Oral Care Recommendations: Oral care QID   Follow up Recommendations        Frequency and Duration  Prognosis        Swallow Study   General HPI: The patient is a 85 yr old woman who presented to [facility] ED with complaints of shortness of breath. PMH  significant for COPD, Chronic respiratory failure requiring 2L O2 for sleep, hyperlipidemia, CHF (DD), CAD s/p stent placement, fibromyalgia, venous insufficiency, DVT/PE on Eliquis, CKD 3, aortic stenosis. Per MD  note "Acute on chronic respiratory failure with hypoxia due to combination of COPD exacerbation and CHF exacerbation: Patient has a cough, wheezing on auscultation and shortness of breath, no infiltration on chest x-ray, clinically consistent with COPD exacerbation." Family reports coughing on liquids at baseline and food being chopped for ease of mastication. Pt reports "I dont usually choke, but I have been recently." Type of Study: Bedside Swallow Evaluation Previous Swallow Assessment: none Diet Prior to this Study: NPO Temperature Spikes Noted: No Respiratory Status: Room air History of Recent Intubation: No Behavior/Cognition: Alert;Cooperative;Pleasant mood Oral Cavity Assessment: Within Functional Limits Oral Care Completed by SLP: No Oral Cavity - Dentition: Dentures, top;Edentulous;Dentures, not available Vision: Functional for self-feeding Self-Feeding Abilities: Able to feed self Patient Positioning: Upright in chair Baseline Vocal Quality: Normal Volitional Cough: Congested Volitional Swallow: Able to elicit    Oral/Motor/Sensory Function Overall Oral Motor/Sensory Function: Within functional limits   Ice Chips     Thin Liquid Thin Liquid: Impaired Presentation: Cup;Straw Pharyngeal  Phase Impairments: Cough - Immediate;Wet Vocal Quality;Multiple swallows(regurgitation)    Nectar Thick Nectar Thick Liquid: Not tested   Honey Thick Honey Thick Liquid: Not tested   Puree Puree: Within functional limits Presentation: Spoon   Solid     Solid: Not tested     [name], MA CCC-SLP  Acute Rehabilitation Services Pager [phone] Office [phone]  Maurene Hollin, Katherene Ponto 12/16/2018,11:52 AM

## 2018-12-16 NOTE — Discharge Instructions (Signed)
Information on my medicine - ELIQUIS® (apixaban) ° °This medication education was reviewed with me or my healthcare representative as part of my discharge preparation.  The pharmacist that spoke with me during my hospital stay was:  °Amara Manalang Stillinger, RPH ° °Why was Eliquis® prescribed for you? °Eliquis® was prescribed to treat blood clots that may have been found in the veins of your legs (deep vein thrombosis) or in your lungs (pulmonary embolism) and to reduce the risk of them occurring again. ° °What do You need to know about Eliquis® ? °The dose is ONE 5 mg tablet taken TWICE daily.  Eliquis® may be taken with or without food.  ° °Try to take the dose about the same time in the morning and in the evening. If you have difficulty swallowing the tablet whole please discuss with your pharmacist how to take the medication safely. ° °Take Eliquis® exactly as prescribed and DO NOT stop taking Eliquis® without talking to the doctor who prescribed the medication.  Stopping may increase your risk of developing a new blood clot.  Refill your prescription before you run out. ° °After discharge, you should have regular check-up appointments with your healthcare provider that is prescribing your Eliquis®. °   °What do you do if you miss a dose? °If a dose of ELIQUIS® is not taken at the scheduled time, take it as soon as possible on the same day and twice-daily administration should be resumed. The dose should not be doubled to make up for a missed dose. ° °Important Safety Information °A possible side effect of Eliquis® is bleeding. You should call your healthcare provider right away if you experience any of the following: °? Bleeding from an injury or your nose that does not stop. °? Unusual colored urine (red or dark brown) or unusual colored stools (red or black). °? Unusual bruising for unknown reasons. °? A serious fall or if you hit your head (even if there is no bleeding). ° °Some medicines may interact  with Eliquis® and might increase your risk of bleeding or clotting while on Eliquis®. To help avoid this, consult your healthcare provider or pharmacist prior to using any new prescription or non-prescription medications, including herbals, vitamins, non-steroidal anti-inflammatory drugs (NSAIDs) and supplements. ° °This website has more information on Eliquis® (apixaban): http://www.eliquis.com/eliquis/home ° ° °

## 2018-12-16 NOTE — Progress Notes (Signed)
PROGRESS NOTE        PATIENT DETAILS Name: Jessica [name] Age: 85 y.o. Sex: female Date of Birth: [birth year] Admit Date: 12/14/2018 Admitting Physician Ivor Costa, MD QBH:ALPFXT, Malka So, MD  Brief Narrative: Patient is a 85 y.o. female with history of COPD on home O2, chronic diastolic heart failure, DVT on Eliquis presented with worsening cough, shortness of breath, found to have acute hypoxic respiratory failure and admitted to the hospitalist service.  See below for further details  Subjective: Continues to cough.  Appears comfortable.  No family at bedside.  Assessment/Plan: Acute on chronic hypoxic respiratory failure: Multifactorial-suspect microaspiration triggering bronchospasm, some element of mild decompensated diastolic heart failure.  Keep n.p.o. until speech evaluation is complete, continue bronchodilators and diuretics.  Repeat chest x-ray-but chest x-ray on 2/18 did not show any obvious pneumonia.  Influenza PCR 2/19 negative.Encourage Incentive   COPD exacerbation: Mostly coughing but no major wheezing this morning-decrease steroids, continue bronchodilators.  Decompensated diastolic heart failure: Volume status seems reasonably stable-continue IV Lasix as long as renal function can tolerate.  Follow weights, intake/output and electrolytes closely.  History of venous thromboembolism: Continue anticoagulation.  History of CAD s/p PCI to LAD on 07/23/2018: No chest pain-continue Plavix, beta-blocker and statin.  Severe aortic stenosis: Followed by cardiology, TAVR was considered but this was postponed due to recent diagnosis of VTE.  Frailty/deconditioning: Appreciate PT evaluation-no family at bedside-but will tentatively plan on home health services on discharge.  DVT Prophylaxis: Full dose anticoagulation with Eliquis  Code Status: Full code  Family Communication: Daughter over the phone  Disposition Plan: Remain  inpatient  Antimicrobial agents: Anti-infectives (From admission, onward)   Start     Dose/Rate Route Frequency Ordered Stop   12/16/18 1000  azithromycin (ZITHROMAX) tablet 250 mg     250 mg Oral Daily 12/15/18 0029 12/20/18 0959   12/15/18 0130  azithromycin (ZITHROMAX) tablet 500 mg     500 mg Oral Daily 12/15/18 0029 12/15/18 0838      Procedures: None  CONSULTS:  None  Time spent: 25- minutes-Greater than 50% of this time was spent in counseling, explanation of diagnosis, planning of further management, and coordination of care.  MEDICATIONS: Scheduled Meds: . apixaban  5 mg Oral BID  . arformoterol  15 mcg Nebulization BID  . azithromycin  250 mg Oral Daily  . cholecalciferol  1,000 Units Oral Daily  . clopidogrel  75 mg Oral Daily  . escitalopram  20 mg Oral Daily  . furosemide  20 mg Intravenous Daily  . guaiFENesin  600 mg Oral BID  . ipratropium  0.5 mg Nebulization TID  . levalbuterol  1.25 mg Nebulization TID  . methylPREDNISolone (SOLU-MEDROL) injection  60 mg Intravenous Q12H  . metoprolol tartrate  12.5 mg Oral BID  . multivitamin  1 tablet Oral Daily  . simvastatin  20 mg Oral QHS  . triamcinolone cream  1 application Topical BID   Continuous Infusions: PRN Meds:.acetaminophen, ALPRAZolam, hydrOXYzine, ipratropium-albuterol, traZODone   PHYSICAL EXAM: Vital signs: Vitals:   12/15/18 2159 12/15/18 2227 12/16/18 0257 12/16/18 0802  BP:  (!) 99/54 (!) 116/58   Pulse:  89 68   Resp:  18 15   Temp:      TempSrc:      SpO2: 99% 98% 100% 99%  Weight:      Height:  Filed Weights   12/15/18 1454  Weight: 61 kg   Body mass index is 22.37 kg/m.   General appearance :Awake, alert, not in any distress. HEENT: Atraumatic and Normocephalic Neck: supple, no JVD.  Resp:Good air entry bilaterally, few scattered rhonchi but mostly clear CVS: S1 S2 regular, 3/6 systolic murmur  GI: Bowel sounds present, Non tender and not distended with no  gaurding, rigidity or rebound.No organomegaly Extremities: B/L Lower Ext shows no edema, both legs are warm to touch Neurology: Nonfocal-moving all 4 extremities Musculoskeletal:No digital cyanosis Skin:No Rash, warm and dry Wounds:N/A  I have personally reviewed following labs and imaging studies  LABORATORY DATA: CBC: Recent Labs  Lab 12/14/18 2237  WBC 5.4  NEUTROABS 3.6  HGB 10.2*  HCT 34.9*  MCV 97.5  PLT 063    Basic Metabolic Panel: Recent Labs  Lab 12/14/18 2237 12/15/18 1634 12/16/18 0735  NA 140 139 138  K 3.5 3.8 4.1  CL 108 103 102  CO2 22 27 27   GLUCOSE 124* 137* 152*  BUN 11 15 18   CREATININE 1.07* 0.93 0.87  CALCIUM 8.9 9.1 9.3  MG  --  1.6* 1.9    GFR: Estimated Creatinine Clearance: 38.7 mL/min (by C-G formula based on SCr of 0.87 mg/dL).  Liver Function Tests: No results for input(s): AST, ALT, ALKPHOS, BILITOT, PROT, ALBUMIN in the last 168 hours. No results for input(s): LIPASE, AMYLASE in the last 168 hours. No results for input(s): AMMONIA in the last 168 hours.  Coagulation Profile: No results for input(s): INR, PROTIME in the last 168 hours.  Cardiac Enzymes: Recent Labs  Lab 12/14/18 2237  TROPONINI <0.03    BNP (last 3 results) No results for input(s): PROBNP in the last 8760 hours.  HbA1C: No results for input(s): HGBA1C in the last 72 hours.  CBG: No results for input(s): GLUCAP in the last 168 hours.  Lipid Profile: No results for input(s): CHOL, HDL, LDLCALC, TRIG, CHOLHDL, LDLDIRECT in the last 72 hours.  Thyroid Function Tests: No results for input(s): TSH, T4TOTAL, FREET4, T3FREE, THYROIDAB in the last 72 hours.  Anemia Panel: No results for input(s): VITAMINB12, FOLATE, FERRITIN, TIBC, IRON, RETICCTPCT in the last 72 hours.  Urine analysis:    Component Value Date/Time   COLORURINE LT. YELLOW 07/06/2009 1057   APPEARANCEUR CLEAR 07/06/2009 1057   LABSPEC 1.010 07/06/2009 1057   PHURINE 5.0 07/06/2009  1057   GLUCOSEU NEGATIVE 07/06/2009 1057   BILIRUBINUR NEGATIVE 07/06/2009 1057   KETONESUR NEGATIVE 07/06/2009 1057   UROBILINOGEN 0.2 07/06/2009 1057   NITRITE NEGATIVE 07/06/2009 1057   LEUKOCYTESUR SMALL 07/06/2009 1057    Sepsis Labs: Lactic Acid, Venous No results found for: LATICACIDVEN  MICROBIOLOGY: Recent Results (from the past 240 hour(s))  Group A Strep by PCR     Status: None   Collection Time: 12/15/18  8:51 AM  Result Value Ref Range Status   Group A Strep by PCR NOT DETECTED NOT DETECTED Final    Comment: Performed at [facility], [place] [address], [place], [place] [address]    RADIOLOGY STUDIES/RESULTS: Dg Chest Port 1 View  Result Date: 12/14/2018 CLINICAL DATA:  Shortness of breath EXAM: PORTABLE CHEST 1 VIEW COMPARISON:  11/09/2014, CT chest 08/24/2018 FINDINGS: Postsurgical changes in the cervical spine. Right shoulder replacement. Scarring at the CP angles. Mild cardiomegaly with aortic atherosclerosis. Central vascular congestion. No focal airspace disease or pneumothorax. IMPRESSION: No active disease.  Cardiomegaly with central vascular congestion Electronically Signed  By: Donavan Foil M.D.   On: 12/14/2018 22:51     LOS: 1 day   Oren Binet, MD  [facility]  If 7PM-7AM, please contact night-coverage  Please page via [email or web address]  Go to [email or web address] and use [facility]'s universal password to access. If you do not have the password, please contact the hospital operator.  Locate the [facility] provider you are looking for under [facility] and page to a number that you can be directly reached. If you still have difficulty reaching the provider, please page the [facility] (Director on Call) for the Hospitalists listed on amion for assistance.  12/16/2018, 12:11 PM

## 2018-12-16 NOTE — Evaluation (Signed)
Physical Therapy Evaluation Patient Details Name: Jessica [name] MRN: [id number] DOB: 10/18/1926 Today's Date: 12/16/2018   History of Present Illness  Jessica [name] is a 85 y.o. female with medical history significant of COPD on 2 L nasal cannula oxygen at night, hyperlipidemia, CAD, dCHF, stent placement, fibromyalgia, venous insufficiency, DVT/PE on Eliquis, CKD 3, aortic stenosis, who presents with shortness of breath.  Workup includes combination of COPD and CHF. exacerbations  Clinical Impression  Pt admitted with/for SOB due to COPD/CHF exacerbation.  Pt needing minimal assist form OOB mobility at this time..  Pt currently limited functionally due to the problems listed below.  (see problems list.)  Pt will benefit from PT to maximize function and safety to be able to get home safely with available assist.     Follow Up Recommendations Home health PT;Supervision for mobility/OOB;Supervision/Assistance - 24 hour    Equipment Recommendations  None recommended by PT    Recommendations for Other Services       Precautions / Restrictions Precautions Precautions: Fall      Mobility  Bed Mobility Overal bed mobility: Needs Assistance Bed Mobility: Supine to Sit     Supine to sit: Min guard        Transfers Overall transfer level: Needs assistance   Transfers: Sit to/from Stand Sit to Stand: Min assist         General transfer comment: posterior bias, pt needed assist coming forward.  Ambulation/Gait Ambulation/Gait assistance: Min assist Gait Distance (Feet): 140 Feet Assistive device: Rolling walker (2 wheeled) Gait Pattern/deviations: Step-through pattern Gait velocity: slower   General Gait Details: mildly retropulsive, mildly unsteady.  Cues to not pick the RW up so much.  Stairs            Wheelchair Mobility    Modified Rankin (Stroke Patients Only)       Balance Overall balance assessment: Needs assistance Sitting-balance support: No  upper extremity supported Sitting balance-Leahy Scale: Fair       Standing balance-Leahy Scale: Poor Standing balance comment: needed AD, stationary object or external assist  washing hands.                             Pertinent Vitals/Pain Pain Assessment: No/denies pain    Home Living Family/patient expects to be discharged to:: Private residence Living Arrangements: Children Available Help at Discharge: Family;Available 24 hours/day Type of Home: House       Home Layout: One level Home Equipment: [place] - 2 wheels;Cane - single point;Shower seat      Prior Function Level of Independence: Needs assistance   Gait / Transfers Assistance Needed: Ambulatory with SPC. Does not drive. 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 Family assists with household tasks        Hand Dominance        Extremity/Trunk Assessment   Upper Extremity Assessment Upper Extremity Assessment: (general strength for mobility functional)    Lower Extremity Assessment Lower Extremity Assessment: Overall WFL for tasks assessed(mild proximal weakness)       Communication   Communication: HOH  Cognition Arousal/Alertness: Awake/alert Behavior During Therapy: WFL for tasks assessed/performed Overall Cognitive Status: Within Functional Limits for tasks assessed  General Comments      Exercises     Assessment/Plan    PT Assessment Patient needs continued PT services  PT Problem List Decreased strength;Decreased activity tolerance;Decreased mobility;Decreased balance;Cardiopulmonary status limiting activity       PT Treatment Interventions Gait training;DME instruction;Functional mobility training;Therapeutic activities;Patient/family education;Balance training    PT Goals (Current goals  can be found in the Care Plan section)  Acute Rehab PT Goals Patient Stated Goal: back home PT Goal Formulation: With patient Time For Goal Achievement: 12/23/18 Potential to Achieve Goals: Good    Frequency Min 3X/week   Barriers to discharge        Co-evaluation               AM-PAC PT "6 Clicks" Mobility  Outcome Measure Help needed turning from your back to your side while in a flat bed without using bedrails?: A Little Help needed moving from lying on your back to sitting on the side of a flat bed without using bedrails?: A Little Help needed moving to and from a bed to a chair (including a wheelchair)?: A Little Help needed standing up from a chair using your arms (e.g., wheelchair or bedside chair)?: A Little Help needed to walk in hospital room?: A Little Help needed climbing 3-5 steps with a railing? : A Little 6 Click Score: 18    End of Session   Activity Tolerance: Patient tolerated treatment well Patient left: in chair;with call bell/phone within reach;with chair alarm set Nurse Communication: Mobility status PT Visit Diagnosis: Unsteadiness on feet (R26.81);Repeated falls (R29.6);Difficulty in walking, not elsewhere classified (R26.2)    Time: 1002-1050 PT Time Calculation (min) (ACUTE ONLY): 48 min   Charges:   PT Evaluation $PT Eval Moderate Complexity: 1 Mod PT Treatments $Gait Training: 8-22 mins $Therapeutic Activity: 8-22 mins        12/16/2018  Donnella Sham, PT Acute Rehabilitation Services [phone]  (pager) [phone]  (office)  Tessie Fass Loanne Emery 12/16/2018, 11:05 AM

## 2018-12-16 NOTE — Care Management Note (Signed)
Case Management Note  Patient Details  Name: Jessica [name] MRN: [id number] Date of Birth: September 01, 1926  Subjective/Objective: Admitted with SOB. Hx of COPD on 2 L nasal cannula oxygen at night, hyperlipidemia, CAD, dCHF, stent placement, fibromyalgia, venous insufficiency, DVT/PE / Eliquis, CKD 3, aortic stenosis. Resides with daughter, [name]. Per [name] pt required some assistance with bathing /dressing PTA.           Ernst Spell (Daughter)       [phone]       Action/Plan: Transition to home with home health services when medically stable.  Pt with transportation to home.  Expected Discharge Date:                  Expected Discharge Plan:  [place]  In-House Referral:  NA  Discharge planning Services  CM Consult  Post Acute Care Choice:  NA Choice offered to:  Adult Children([name], daughter)  DME Arranged:  N/A DME Agency:  NA  HH Arranged:  PT, RN, Nurse's Aide [place] Agency:  [place]  Status of Service:  In process, will continue to follow  If discussed at Long Length of Stay Meetings, dates discussed:    Additional Comments:  Sharin Mons, RN 12/16/2018, 2:13 PM

## 2018-12-16 NOTE — Plan of Care (Signed)
Need further reinforcement on current care plans.   Problem: Education: Goal: Knowledge of General Education information will improve Description Including pain rating scale, medication(s)/side effects and non-pharmacologic comfort measures Outcome: Not Progressing   Problem: Health Behavior/Discharge Planning: Goal: Ability to manage health-related needs will improve Outcome: Not Progressing   Problem: Clinical Measurements: Goal: Respiratory complications will improve Outcome: Not Progressing   Problem: Activity: Goal: Risk for activity intolerance will decrease Outcome: Not Progressing   Problem: Nutrition: Goal: Adequate nutrition will be maintained Outcome: Not Progressing   Problem: Elimination: Goal: Will not experience complications related to bowel motility Outcome: Not Progressing   Problem: Safety: Goal: Ability to remain free from injury will improve Outcome: Not Progressing   Problem: Skin Integrity: Goal: Risk for impaired skin integrity will decrease Outcome: Not Progressing

## 2018-12-17 LAB — BASIC METABOLIC PANEL
ANION GAP: 5 (ref 5–15)
BUN: 24 mg/dL — ABNORMAL HIGH (ref 8–23)
CO2: 34 mmol/L — ABNORMAL HIGH (ref 22–32)
Calcium: 9.4 mg/dL (ref 8.9–10.3)
Chloride: 101 mmol/L (ref 98–111)
Creatinine, Ser: 0.98 mg/dL (ref 0.44–1.00)
GFR calc Af Amer: 59 mL/min — ABNORMAL LOW (ref >60)
GFR calc non Af Amer: 51 mL/min — ABNORMAL LOW (ref >60)
Glucose, Bld: 138 mg/dL — ABNORMAL HIGH (ref 70–99)
POTASSIUM: 4.4 mmol/L (ref 3.5–5.1)
Sodium: 140 mmol/L (ref 135–145)

## 2018-12-17 MED ORDER — SODIUM CHLORIDE 0.9 % IV SOLN
3.0000 g | Freq: Three times a day (TID) | INTRAVENOUS | Status: DC
  Administered 2018-12-17 – 2018-12-18 (×4): 3 g via INTRAVENOUS
  Filled 2018-12-17 (×5): qty 3

## 2018-12-17 MED ORDER — LEVALBUTEROL HCL 1.25 MG/0.5ML IN NEBU
1.2500 mg | INHALATION_SOLUTION | Freq: Two times a day (BID) | RESPIRATORY_TRACT | Status: DC
  Administered 2018-12-17 – 2018-12-18 (×2): 1.25 mg via RESPIRATORY_TRACT
  Filled 2018-12-17 (×2): qty 0.5

## 2018-12-17 MED ORDER — IPRATROPIUM BROMIDE 0.02 % IN SOLN
0.5000 mg | Freq: Two times a day (BID) | RESPIRATORY_TRACT | Status: DC
  Administered 2018-12-17 – 2018-12-18 (×2): 0.5 mg via RESPIRATORY_TRACT
  Filled 2018-12-17 (×2): qty 2.5

## 2018-12-17 NOTE — Consult Note (Signed)
   [facility] Inpatient Consult   12/17/2018  [facility] [birth year] [id number]  Patient was assessed for [facility] for community services. Patient was previously active with [facility].  Met with patient at bedside regarding no family at bedside. Assisted patient with hospital telephone.  She states she uses her flip phone. Patient no needs.  There is a [facility] consent on file.  If [facility] community follow up needed please make a referral or contact liaison. Of note, [facility] Care Management services does not replace or interfere with any services that are arranged by inpatient case management or social work. For additional questions or referrals please contact:   Natividad Brood, RN BSN [facility] Liaison  [phone] business mobile phone Toll free office [phone]

## 2018-12-17 NOTE — Progress Notes (Signed)
PROGRESS NOTE        PATIENT DETAILS Name: Jessica [name] Age: 85 y.o. Sex: female Date of Birth: [birth year] Admit Date: 12/14/2018 Admitting Physician Ivor Costa, MD GDJ:MEQAST, Malka So, MD  Brief Narrative: Patient is a 85 y.o. female with history of COPD on home O2, chronic diastolic heart failure, DVT on Eliquis presented with worsening cough, shortness of breath, found to have acute hypoxic respiratory failure and admitted to the hospitalist service.  See below for further details  Subjective: Feels better-coughing has improved.  Assessment/Plan: Acute on chronic hypoxic respiratory failure: Multifactorial-likely aspiration pneumonia triggering bronchospasm and COPD exacerbation.  Evaluated by speech therapy and now on a dysphagia 2 diet.  Have started empiric Unasyn.  Influenza PCR 2/19-.  Continue pulmonary toileting, incentive spirometry/flutter valve and bronchodilators.  If she continues to improve, she should be stable for discharge on 2/22.   COPD exacerbation: Less wheezing this morning compared to yesterday-change from Solu-Medrol to prednisone, continue bronchodilators.  Encourage incentive spirometry/flutter valve.  Decompensated diastolic heart failure: Volume status seems reasonably stable-will change from IV Lasix to oral Lasix today.   Follow weights, intake/output and electrolytes closely.  History of venous thromboembolism: Continue anticoagulation with Eliquis.  History of CAD s/p PCI to LAD on 07/23/2018: No chest pain-continue Plavix, beta-blocker and statin.  Severe aortic stenosis: Followed by cardiology, TAVR was considered but this was postponed due to recent diagnosis of VTE.  Frailty/deconditioning: Appreciate PT evaluation-no family at bedside-but will tentatively plan on home health services on discharge.  DVT Prophylaxis: Full dose anticoagulation with Eliquis  Code Status: Full code  Family Communication: None at  bedside  Disposition Plan: Remain inpatient-if clinical improvement continues-home on 2/22.  Antimicrobial agents: Anti-infectives (From admission, onward)   Start     Dose/Rate Route Frequency Ordered Stop   12/17/18 0800  Ampicillin-Sulbactam (UNASYN) 3 g in sodium chloride 0.9 % 100 mL IVPB     3 g 200 mL/hr over 30 Minutes Intravenous Every 8 hours 12/17/18 0729     12/16/18 1000  azithromycin (ZITHROMAX) tablet 250 mg  Status:  Discontinued     250 mg Oral Daily 12/15/18 0029 12/17/18 0719   12/15/18 0130  azithromycin (ZITHROMAX) tablet 500 mg     500 mg Oral Daily 12/15/18 0029 12/15/18 0838      Procedures: None  CONSULTS:  None  Time spent: 25- minutes-Greater than 50% of this time was spent in counseling, explanation of diagnosis, planning of further management, and coordination of care.  MEDICATIONS: Scheduled Meds: . apixaban  5 mg Oral BID  . arformoterol  15 mcg Nebulization BID  . cholecalciferol  1,000 Units Oral Daily  . clopidogrel  75 mg Oral Daily  . escitalopram  20 mg Oral Daily  . fluticasone  2 spray Each Nare Daily  . furosemide  20 mg Intravenous Daily  . guaiFENesin  600 mg Oral BID  . ipratropium  0.5 mg Nebulization TID  . levalbuterol  1.25 mg Nebulization TID  . loratadine  10 mg Oral Daily  . methylPREDNISolone (SOLU-MEDROL) injection  40 mg Intravenous Q12H  . metoprolol tartrate  12.5 mg Oral BID  . multivitamin  1 tablet Oral Daily  . pantoprazole  40 mg Oral Q1200  . simvastatin  20 mg Oral QHS  . triamcinolone cream  1 application Topical BID  Continuous Infusions: . ampicillin-sulbactam (UNASYN) IV 3 g (12/17/18 0924)   PRN Meds:.acetaminophen, ALPRAZolam, benzonatate, hydrOXYzine, ipratropium-albuterol, RESOURCE THICKENUP CLEAR, traZODone   PHYSICAL EXAM: Vital signs: Vitals:   12/17/18 0100 12/17/18 0830 12/17/18 0832 12/17/18 0848  BP:    (!) 115/51  Pulse: 72     Resp: 15   20  Temp:    97.7 F (36.5 C)  TempSrc:     Oral  SpO2: 97% 93% 99% 94%  Weight:      Height:       Filed Weights   12/15/18 1454  Weight: 61 kg   Body mass index is 22.37 kg/m.   General appearance:Awake, alert, not in any distress.  Eyes:no scleral icterus. HEENT: Atraumatic and Normocephalic Neck: supple, no JVD. Resp:Good air entry bilaterally, few scattered rhonchi but mostly clear to auscultation CVS: S1 S2 regular, 3/6 systolic murmur GI: Bowel sounds present, Non tender and not distended with no gaurding, rigidity or rebound. Extremities: B/L Lower Ext shows no edema, both legs are warm to touch Neurology:  Non focal Psychiatric: Normal judgment and insight. Musculoskeletal:No digital cyanosis Skin:No Rash, warm and dry Wounds:N/A  I have personally reviewed following labs and imaging studies  LABORATORY DATA: CBC: Recent Labs  Lab 12/14/18 2237  WBC 5.4  NEUTROABS 3.6  HGB 10.2*  HCT 34.9*  MCV 97.5  PLT 401    Basic Metabolic Panel: Recent Labs  Lab 12/14/18 2237 12/15/18 1634 12/16/18 0735 12/17/18 0740  NA 140 139 138 140  K 3.5 3.8 4.1 4.4  CL 108 103 102 101  CO2 22 27 27  34*  GLUCOSE 124* 137* 152* 138*  BUN 11 15 18  24*  CREATININE 1.07* 0.93 0.87 0.98  CALCIUM 8.9 9.1 9.3 9.4  MG  --  1.6* 1.9  --     GFR: Estimated Creatinine Clearance: 34.3 mL/min (by C-G formula based on SCr of 0.98 mg/dL).  Liver Function Tests: No results for input(s): AST, ALT, ALKPHOS, BILITOT, PROT, ALBUMIN in the last 168 hours. No results for input(s): LIPASE, AMYLASE in the last 168 hours. No results for input(s): AMMONIA in the last 168 hours.  Coagulation Profile: No results for input(s): INR, PROTIME in the last 168 hours.  Cardiac Enzymes: Recent Labs  Lab 12/14/18 2237  TROPONINI <0.03    BNP (last 3 results) No results for input(s): PROBNP in the last 8760 hours.  HbA1C: No results for input(s): HGBA1C in the last 72 hours.  CBG: No results for input(s): GLUCAP in the last  168 hours.  Lipid Profile: No results for input(s): CHOL, HDL, LDLCALC, TRIG, CHOLHDL, LDLDIRECT in the last 72 hours.  Thyroid Function Tests: No results for input(s): TSH, T4TOTAL, FREET4, T3FREE, THYROIDAB in the last 72 hours.  Anemia Panel: No results for input(s): VITAMINB12, FOLATE, FERRITIN, TIBC, IRON, RETICCTPCT in the last 72 hours.  Urine analysis:    Component Value Date/Time   COLORURINE LT. YELLOW 07/06/2009 1057   APPEARANCEUR CLEAR 07/06/2009 1057   LABSPEC 1.010 07/06/2009 1057   PHURINE 5.0 07/06/2009 1057   GLUCOSEU NEGATIVE 07/06/2009 1057   BILIRUBINUR NEGATIVE 07/06/2009 1057   KETONESUR NEGATIVE 07/06/2009 1057   UROBILINOGEN 0.2 07/06/2009 1057   NITRITE NEGATIVE 07/06/2009 1057   LEUKOCYTESUR SMALL 07/06/2009 1057    Sepsis Labs: Lactic Acid, Venous No results found for: LATICACIDVEN  MICROBIOLOGY: Recent Results (from the past 240 hour(s))  Group A Strep by PCR     Status: None   Collection Time:  12/15/18  8:51 AM  Result Value Ref Range Status   Group A Strep by PCR NOT DETECTED NOT DETECTED Final    Comment: Performed at [facility], [address], [place], [place] [address]    RADIOLOGY STUDIES/RESULTS: Dg Chest 1 View  Result Date: 12/16/2018 CLINICAL DATA:  Cough and wheezing. EXAM: CHEST  1 VIEW COMPARISON:  12/14/2018. FINDINGS: Stable enlarged cardiac silhouette. Interval mild patchy opacity at the left lung base. Diffuse osteopenia, left shoulder degenerative changes and right shoulder prosthesis. IMPRESSION: 1. Interval mild patchy opacity at the left lung base, suspicious for pneumonia. 2. Stable cardiomegaly. Electronically Signed   By: Claudie Revering M.D.   On: 12/16/2018 15:02   Dg Chest Port 1 View  Result Date: 12/14/2018 CLINICAL DATA:  Shortness of breath EXAM: PORTABLE CHEST 1 VIEW COMPARISON:  11/09/2014, CT chest 08/24/2018 FINDINGS: Postsurgical changes in the cervical spine. Right shoulder replacement. Scarring at  the CP angles. Mild cardiomegaly with aortic atherosclerosis. Central vascular congestion. No focal airspace disease or pneumothorax. IMPRESSION: No active disease.  Cardiomegaly with central vascular congestion Electronically Signed   By: Donavan Foil M.D.   On: 12/14/2018 22:51     LOS: 2 days   Oren Binet, MD  [facility]  If 7PM-7AM, please contact night-coverage  Please page via [email or web address]  Go to [email or web address] and use [facility] universal password to access. If you do not have the password, please contact the hospital operator.  Locate the [facility] provider you are looking for under [facility] and page to a number that you can be directly reached. If you still have difficulty reaching the provider, please page the [facility] (Director on Call) for the Hospitalists listed on amion for assistance.  12/17/2018, 10:43 AM

## 2018-12-17 NOTE — Progress Notes (Signed)
Pharmacy Antibiotic Note  Jessica [name] is a 85 y.o. female admitted on 12/14/2018 with aspiration pna.  Pharmacy has been consulted for Unasyn dosing.  Plan: Unasyn 3gm IV q8h Will f/u micro data, pt's clinical condition, and renal function  Height: 5\' 5"  (165.1 cm) Weight: 134 lb 6.4 oz (61 kg) IBW/kg (Calculated) : 57  Temp (24hrs), Avg:97.3 F (36.3 C), Min:97.3 F (36.3 C), Max:97.3 F (36.3 C)  Recent Labs  Lab 12/14/18 2237 12/15/18 1634 12/16/18 0735  WBC 5.4  --   --   CREATININE 1.07* 0.93 0.87    Estimated Creatinine Clearance: 38.7 mL/min (by C-G formula based on SCr of 0.87 mg/dL).    Allergies  Allergen Reactions  . Contrast Media [Iodinated Diagnostic Agents] Itching, Rash and Other (See Comments)    Hypotension, Skin turns red like a sunburn  . Iohexol Itching, Rash and Other (See Comments)    Hypotension, Skin turns red like a sunburn    Antimicrobials this admission: 2/21 Unasyn >>   Microbiology results: Group B strep PCR - negative  Thank you for allowing pharmacy to be a part of this patient's care.  Sherlon Handing, PharmD, BCPS Clinical pharmacist  **Pharmacist phone directory can now be found on [email or web address] ([email or web address]).  Listed under [place]. 12/17/2018 7:26 AM

## 2018-12-17 NOTE — Progress Notes (Addendum)
  Speech Language Pathology Treatment: Dysphagia  Patient Details Name: Jessica [name] MRN: [id number] DOB: [birth year] Today's Date: 12/17/2018 Time: 0145-0200 SLP Time Calculation (min) (ACUTE ONLY): 15 min  Assessment / Plan / Recommendation Clinical Impression  Pt had just finished dys 2, nectar-thick liquid lunch tray upon SLP arrival and refused PO trials for SLP observation and tx. Pt displayed excessive coughing, spitting up of secretions and incident x1 of liquid PO regurgitation from lunch tray. Discussed MBS results, observations and concerns with daughter, primary caretaker, who stated that pt has been spitting up/regurgitating POs for 2 years now. Described nature of pt's pharyngeal dysphagia, rationale for placing her on nectar-thick liquids and how to thicken drinks once pt is d/c. Educated daughter on aspiration risk and recommended talking to primary physician about reflux management as well as f/u with GI. She expressed understanding and agreement. SLP to f/u for diet tolerance and to provide further pt/family education.   HPI HPI: The patient is a 85 yr old woman who presented to [facility] ED with complaints of shortness of breath. PMH  significant for COPD, Chronic respiratory failure requiring 2L O2 for sleep, hyperlipidemia, CHF (DD), CAD s/p stent placement, fibromyalgia, venous insufficiency, DVT/PE on Eliquis, CKD 3, aortic stenosis. Per MD note "Acute on chronic respiratory failure with hypoxia due to combination of COPD exacerbation and CHF exacerbation: Patient has a cough, wheezing on auscultation and shortness of breath, no infiltration on chest x-ray, clinically consistent with COPD exacerbation." Family reports coughing on liquids at baseline and food being chopped for ease of mastication. Pt reports "I dont usually choke, but I have been recently."      SLP Plan  Continue with current plan of care       Recommendations  Diet recommendations: Dysphagia 2 (fine  chop);Nectar-thick liquid Liquids provided via: Cup;No straw Medication Administration: Whole meds with puree Supervision: Patient able to self feed;Full supervision/cueing for compensatory strategies Compensations: Slow rate;Small sips/bites Postural Changes and/or Swallow Maneuvers: Seated upright 90 degrees;Upright 30-60 min after meal                Oral Care Recommendations: Oral care BID Follow up Recommendations: 24 hour supervision/assistance SLP Visit Diagnosis: Dysphagia, pharyngeal phase (R13.13) Plan: Continue with current plan of care       GO                Ellis Savage, Student SLP 12/17/2018, 2:15 PM

## 2018-12-18 MED ORDER — AMOXICILLIN-POT CLAVULANATE 500-125 MG PO TABS: 1.0000 | ORAL_TABLET | Freq: Two times a day (BID) | ORAL | 0 refills | Status: DC

## 2018-12-18 MED ORDER — PREDNISONE 10 MG PO TABS: ORAL_TABLET | ORAL | 0 refills | Status: DC

## 2018-12-18 MED ORDER — BENZONATATE 200 MG PO CAPS: 200.0000 mg | ORAL_CAPSULE | Freq: Three times a day (TID) | ORAL | 0 refills | Status: DC | PRN

## 2018-12-18 MED ORDER — METOPROLOL TARTRATE 25 MG PO TABS: 12.5000 mg | ORAL_TABLET | Freq: Two times a day (BID) | ORAL | 0 refills | Status: DC

## 2018-12-18 MED ORDER — PANTOPRAZOLE SODIUM 40 MG PO TBEC: 40.0000 mg | DELAYED_RELEASE_TABLET | Freq: Every day | ORAL | 0 refills | Status: DC

## 2018-12-18 MED ORDER — AMOXICILLIN-POT CLAVULANATE 500-125 MG PO TABS: 1.0000 | ORAL_TABLET | Freq: Three times a day (TID) | ORAL | 0 refills | Status: DC

## 2018-12-18 MED ORDER — GUAIFENESIN ER 600 MG PO TB12: 600.0000 mg | ORAL_TABLET | Freq: Two times a day (BID) | ORAL | 0 refills | Status: DC

## 2018-12-18 MED ORDER — FLUTICASONE PROPIONATE 50 MCG/ACT NA SUSP: 2.0000 | Freq: Every day | NASAL | 0 refills | Status: DC

## 2018-12-18 MED ORDER — LORATADINE 10 MG PO TABS: 10.0000 mg | ORAL_TABLET | Freq: Every day | ORAL | 0 refills | Status: DC

## 2018-12-18 NOTE — Discharge Summary (Signed)
PATIENT DETAILS Name: STUTI SANDIN Age: 85 y.o. Sex: female Date of Birth: Jan 01, 1926 MRN: [id number]. Admitting Physician: Ivor Costa, MD TXM:IWOEHO, Malka So, MD  Admit Date: 12/14/2018 Discharge date: 12/18/2018  Recommendations for Outpatient Follow-up:  1. Follow up with PCP in 1-2 weeks 2. Please obtain BMP/CBC in one week  Admitted From:  Home  Disposition: Home with home health services    [place]: Yes  Equipment/Devices: None  Discharge Condition: Stable  CODE STATUS: FULL CODE  Diet recommendation:  Heart Healthy   Brief Summary: See H&P, Labs, Consult and Test reports for all details in brief, Patient is a 85 y.o. female with history of COPD on home O2, chronic diastolic heart failure, DVT on Eliquis presented with worsening cough, shortness of breath, found to have acute hypoxic respiratory failure and admitted to the hospitalist service.  See below for further details  Brief Hospital Course: Acute on chronic hypoxic respiratory failure: Multifactorial-likely aspiration pneumonia triggering bronchospasm and COPD exacerbation.  Evaluated by speech therapy and now on a dysphagia 2 diet.    Much improved with IV Unasyn, bronchodilators, steroids-this morning lungs are mostly clear-only a few scattered wheezing.  She is moving air well. Influenza PCR 2/19  COPD exacerbation:  Much improved-hardly any wheezing this morning-moving air well, will transition from Solu-Medrol to prednisone.  Continue bronchodilators.   Decompensated diastolic heart failure: Volume status seems stable-she was treated with IV Lasix-but her volume status is stable-suspect we can continue to watch her without Lasix.  Continue to follow weights, electrolytes periodically in the outpatient setting.  History of venous thromboembolism: Continue anticoagulation with Eliquis.  History of CAD s/p PCI to LAD on 07/23/2018: No chest pain-continue Plavix, beta-blocker and  statin.  Severe aortic stenosis: Followed by cardiology, TAVR was considered but this was postponed due to recent diagnosis of VTE.  Frailty/deconditioning: Appreciate PT evaluation-no family at bedside-but will tentatively plan on home health services on discharge.  Procedures/Studies: None  Discharge Diagnoses:  Principal Problem:   COPD exacerbation ([place]) Active Problems:   Hyperlipidemia   Generalized anxiety disorder   PE (pulmonary thromboembolism) (HCC)   Acute on chronic respiratory failure with hypoxia (HCC)   Depression   CAD (coronary artery disease)   CKD (chronic kidney disease), stage III (HCC)   Acute on chronic diastolic (congestive) heart failure [facility])   Discharge Instructions:  Activity:  As tolerated with Full fall precautions use walker/cane & assistance as needed  Discharge Instructions    Diet - low sodium heart healthy   Complete by:  As directed    Diet recommendations: Dysphagia 2 (fine chop);Nectar-thick liquid Liquids provided via: Cup;No straw Medication Administration: Whole meds with puree Supervision: Patient able to self feed;Full supervision/cueing for compensatory strategies Compensations: Slow rate;Small sips/bites Postural Changes and/or Swallow Maneuvers: Seated upright 90 degrees;Upright 30-60 min after meal   Discharge instructions   Complete by:  As directed    Follow with Primary MD  [name], [name], MD in 1 week  Please get a complete blood count and chemistry panel checked by your Primary MD at your next visit, and again as instructed by your Primary MD.  Get Medicines reviewed and adjusted: Please take all your medications with you for your next visit with your Primary MD  Laboratory/radiological data: Please request your Primary MD to go over all hospital tests and procedure/radiological results at the follow up, please ask your Primary MD to get all Hospital records sent to his/her office.  In  some cases, they will be  blood work, cultures and biopsy results pending at the time of your discharge. Please request that your primary care M.D. follows up on these results.  Also Note the following: If you experience worsening of your admission symptoms, develop shortness of breath, life threatening emergency, suicidal or homicidal thoughts you must seek medical attention immediately by calling 911 or calling your MD immediately  if symptoms less severe.  You must read complete instructions/literature along with all the possible adverse reactions/side effects for all the Medicines you take and that have been prescribed to you. Take any new Medicines after you have completely understood and accpet all the possible adverse reactions/side effects.   Do not drive when taking Pain medications or sleeping medications (Benzodaizepines)  Do not take more than prescribed Pain, Sleep and Anxiety Medications. It is not advisable to combine anxiety,sleep and pain medications without talking with your primary care practitioner  Special Instructions: If you have smoked or chewed Tobacco  in the last 2 yrs please stop smoking, stop any regular Alcohol  and or any Recreational drug use.  Wear Seat belts while driving.  Please note: You were cared for by a hospitalist during your hospital stay. Once you are discharged, your primary care physician will handle any further medical issues. Please note that NO REFILLS for any discharge medications will be authorized once you are discharged, as it is imperative that you return to your primary care physician (or establish a relationship with a primary care physician if you do not have one) for your post hospital discharge needs so that they can reassess your need for medications and monitor your lab values.   Increase activity slowly   Complete by:  As directed      Allergies as of 12/18/2018      Reactions   Contrast Media [iodinated Diagnostic Agents] Itching, Rash, Other (See Comments)    Hypotension, Skin turns red like a sunburn   Iohexol Itching, Rash, Other (See Comments)   Hypotension, Skin turns red like a sunburn      Medication List    TAKE these medications   albuterol (2.5 MG/3ML) 0.083% nebulizer solution Commonly known as:  PROVENTIL Take 3 mLs (2.5 mg total) by nebulization every 4 (four) hours as needed for wheezing or shortness of breath. DX: COPD J44.9 What changed:  Another medication with the same name was changed. Make sure you understand how and when to take each.   VENTOLIN HFA 108 (90 Base) MCG/ACT inhaler Generic drug:  albuterol INHALE 2 PUFFS EVERY 6HRS AS NEEDED FOR WHEEZING OR SHORTNESS OF BREATH. DO NOT USE IF USING NEBS. What changed:  See the new instructions.   ALPRAZolam 0.5 MG tablet Commonly known as:  XANAX Take 1 tablet (0.5 mg total) by mouth at bedtime as needed for anxiety.   AMBULATORY NON FORMULARY MEDICATION Medication Name: Incentive spirometer Use 10-15 times per day   amoxicillin-clavulanate 500-125 MG tablet Commonly known as:  AUGMENTIN Take 1 tablet (500 mg total) by mouth 2 (two) times daily.   arformoterol 15 MCG/2ML Nebu Commonly known as:  BROVANA Take 2 mLs (15 mcg total) by nebulization 2 (two) times daily. DX: COPD J44.9 What changed:  additional instructions   benzonatate 200 MG capsule Commonly known as:  TESSALON Take 1 capsule (200 mg total) by mouth 3 (three) times daily as needed for cough.   budesonide 0.5 MG/2ML nebulizer solution Commonly known as:  PULMICORT Take 2 mLs (0.5 mg  total) by nebulization 2 (two) times daily. DX: COPD J44.9   clopidogrel 75 MG tablet Commonly known as:  PLAVIX Take 1 tablet (75 mg total) by mouth daily.   ELIQUIS 5 MG Tabs tablet Generic drug:  apixaban TAKE 1 TABLET BY MOUTH TWICE A DAY   escitalopram 20 MG tablet Commonly known as:  LEXAPRO Take 1 tablet (20 mg total) by mouth daily.   fluticasone 50 MCG/ACT nasal spray Commonly known as:   FLONASE Place 2 sprays into both nostrils daily.   FLUTTER Devi 1 each by Does not apply route daily.   guaiFENesin 600 MG 12 hr tablet Commonly known as:  MUCINEX Take 1 tablet (600 mg total) by mouth 2 (two) times daily.   loratadine 10 MG tablet Commonly known as:  CLARITIN Take 1 tablet (10 mg total) by mouth daily.   metoprolol tartrate 25 MG tablet Commonly known as:  LOPRESSOR Take 0.5 tablets (12.5 mg total) by mouth 2 (two) times daily.   multivitamin-lutein Caps capsule Take 1 capsule by mouth daily.   pantoprazole 40 MG tablet Commonly known as:  PROTONIX Take 1 tablet (40 mg total) by mouth daily at 12 noon.   predniSONE 10 MG tablet Commonly known as:  DELTASONE Take 40 mg daily for 2 days, 30 mg daily for 2 days, 20 mg daily for 2 days,10 mg daily for 1 days, then stop   simvastatin 20 MG tablet Commonly known as:  ZOCOR Take 1 tablet (20 mg total) by mouth at bedtime.   traZODone 50 MG tablet Commonly known as:  DESYREL Take 0.5-1 tablets (25-50 mg total) by mouth at bedtime as needed for sleep.   triamcinolone cream 0.1 % Commonly known as:  KENALOG Apply 1 application topically 2 (two) times daily.   Vitamin D 1000 units capsule Take 1,000 Units by mouth daily.      Follow-up Information    [name], [name], MD. Schedule an appointment as soon as possible for a visit in 1 week(s).   Specialty:  Family Medicine Contact information: [name] [address] [phone]        [name], MD. Schedule an appointment as soon as possible for a visit in 2 week(s).   Specialty:  Cardiology Contact information: [address]  [place] [address] [phone]          Allergies  Allergen Reactions  . Contrast Media [Iodinated Diagnostic Agents] Itching, Rash and Other (See Comments)    Hypotension, Skin turns red like a sunburn  . Iohexol Itching, Rash and Other (See Comments)    Hypotension,  Skin turns red like a sunburn    Consultations:   None  Other Procedures/Studies: Dg Chest 1 View  Result Date: 12/16/2018 CLINICAL DATA:  Cough and wheezing. EXAM: CHEST  1 VIEW COMPARISON:  12/14/2018. FINDINGS: Stable enlarged cardiac silhouette. Interval mild patchy opacity at the left lung base. Diffuse osteopenia, left shoulder degenerative changes and right shoulder prosthesis. IMPRESSION: 1. Interval mild patchy opacity at the left lung base, suspicious for pneumonia. 2. Stable cardiomegaly. Electronically Signed   By: Claudie Revering M.D.   On: 12/16/2018 15:02   Dg Chest Port 1 View  Result Date: 12/14/2018 CLINICAL DATA:  Shortness of breath EXAM: PORTABLE CHEST 1 VIEW COMPARISON:  11/09/2014, CT chest 08/24/2018 FINDINGS: Postsurgical changes in the cervical spine. Right shoulder replacement. Scarring at the CP angles. Mild cardiomegaly with aortic atherosclerosis. Central vascular congestion. No focal airspace  disease or pneumothorax. IMPRESSION: No active disease.  Cardiomegaly with central vascular congestion Electronically Signed   By: Donavan Foil M.D.   On: 12/14/2018 22:51     TODAY-DAY OF DISCHARGE:  Subjective:   Noel Gerold today has no headache,no chest abdominal pain,no new weakness tingling or numbness, feels much better wants to go home today.   Objective:   Blood pressure 122/69, pulse 66, temperature 97.8 F (36.6 C), resp. rate 16, height 5\' 5"  (1.651 m), weight 61 kg, SpO2 (!) 86 %.  Intake/Output Summary (Last 24 hours) at 12/18/2018 0918 Last data filed at 12/17/2018 2152 Gross per 24 hour  Intake 562 ml  Output 700 ml  Net -138 ml   Filed Weights   12/15/18 1454  Weight: 61 kg    Exam: Awake Alert, Oriented *3, No new F.N deficits, Normal affect [place].AT,PERRAL Supple Neck,No JVD, No cervical lymphadenopathy appriciated.  Symmetrical Chest wall movement, Good air movement bilaterally, CTAB RRR,No Gallops,Rubs or new Murmurs, No Parasternal  Heave +ve B.Sounds, Abd Soft, Non tender, No organomegaly appriciated, No rebound -guarding or rigidity. No Cyanosis, Clubbing or edema, No new Rash or bruise   PERTINENT RADIOLOGIC STUDIES: Dg Chest 1 View  Result Date: 12/16/2018 CLINICAL DATA:  Cough and wheezing. EXAM: CHEST  1 VIEW COMPARISON:  12/14/2018. FINDINGS: Stable enlarged cardiac silhouette. Interval mild patchy opacity at the left lung base. Diffuse osteopenia, left shoulder degenerative changes and right shoulder prosthesis. IMPRESSION: 1. Interval mild patchy opacity at the left lung base, suspicious for pneumonia. 2. Stable cardiomegaly. Electronically Signed   By: Claudie Revering M.D.   On: 12/16/2018 15:02   Dg Chest Port 1 View  Result Date: 12/14/2018 CLINICAL DATA:  Shortness of breath EXAM: PORTABLE CHEST 1 VIEW COMPARISON:  11/09/2014, CT chest 08/24/2018 FINDINGS: Postsurgical changes in the cervical spine. Right shoulder replacement. Scarring at the CP angles. Mild cardiomegaly with aortic atherosclerosis. Central vascular congestion. No focal airspace disease or pneumothorax. IMPRESSION: No active disease.  Cardiomegaly with central vascular congestion Electronically Signed   By: Donavan Foil M.D.   On: 12/14/2018 22:51     PERTINENT LAB RESULTS: CBC: No results for input(s): WBC, HGB, HCT, PLT in the last 72 hours. CMET CMP     Component Value Date/Time   NA 140 12/17/2018 0740   NA 145 (H) 07/15/2018 1032   K 4.4 12/17/2018 0740   CL 101 12/17/2018 0740   CO2 34 (H) 12/17/2018 0740   GLUCOSE 138 (H) 12/17/2018 0740   BUN 24 (H) 12/17/2018 0740   BUN 10 07/15/2018 1032   CREATININE 0.98 12/17/2018 0740   CALCIUM 9.4 12/17/2018 0740   PROT 6.4 (L) 08/25/2018 0540   ALBUMIN 3.2 (L) 08/25/2018 0540   AST 20 08/25/2018 0540   ALT 11 08/25/2018 0540   ALKPHOS 106 08/25/2018 0540   BILITOT 0.5 08/25/2018 0540   GFRNONAA 51 (L) 12/17/2018 0740   GFRAA 59 (L) 12/17/2018 0740    GFR Estimated Creatinine  Clearance: 34.3 mL/min (by C-G formula based on SCr of 0.98 mg/dL). No results for input(s): LIPASE, AMYLASE in the last 72 hours. No results for input(s): CKTOTAL, CKMB, CKMBINDEX, TROPONINI in the last 72 hours. Invalid input(s): POCBNP No results for input(s): DDIMER in the last 72 hours. No results for input(s): HGBA1C in the last 72 hours. No results for input(s): CHOL, HDL, LDLCALC, TRIG, CHOLHDL, LDLDIRECT in the last 72 hours. No results for input(s): TSH, T4TOTAL, T3FREE, THYROIDAB in the last 72  hours.  Invalid input(s): FREET3 No results for input(s): VITAMINB12, FOLATE, FERRITIN, TIBC, IRON, RETICCTPCT in the last 72 hours. Coags: No results for input(s): INR in the last 72 hours.  Invalid input(s): PT Microbiology: Recent Results (from the past 240 hour(s))  Group A Strep by PCR     Status: None   Collection Time: 12/15/18  8:51 AM  Result Value Ref Range Status   Group A Strep by PCR NOT DETECTED NOT DETECTED Final    Comment: Performed at [facility], [place] [address], [place], [place] [address]    FURTHER DISCHARGE INSTRUCTIONS:  Get Medicines reviewed and adjusted: Please take all your medications with you for your next visit with your Primary MD  Laboratory/radiological data: Please request your Primary MD to go over all hospital tests and procedure/radiological results at the follow up, please ask your Primary MD to get all Hospital records sent to his/her office.  In some cases, they will be blood work, cultures and biopsy results pending at the time of your discharge. Please request that your primary care M.D. goes through all the records of your hospital data and follows up on these results.  Also Note the following: If you experience worsening of your admission symptoms, develop shortness of breath, life threatening emergency, suicidal or homicidal thoughts you must seek medical attention immediately by calling 911 or calling your MD immediately  if  symptoms less severe.  You must read complete instructions/literature along with all the possible adverse reactions/side effects for all the Medicines you take and that have been prescribed to you. Take any new Medicines after you have completely understood and accpet all the possible adverse reactions/side effects.   Do not drive when taking Pain medications or sleeping medications (Benzodaizepines)  Do not take more than prescribed Pain, Sleep and Anxiety Medications. It is not advisable to combine anxiety,sleep and pain medications without talking with your primary care practitioner  Special Instructions: If you have smoked or chewed Tobacco  in the last 2 yrs please stop smoking, stop any regular Alcohol  and or any Recreational drug use.  Wear Seat belts while driving.  Please note: You were cared for by a hospitalist during your hospital stay. Once you are discharged, your primary care physician will handle any further medical issues. Please note that NO REFILLS for any discharge medications will be authorized once you are discharged, as it is imperative that you return to your primary care physician (or establish a relationship with a primary care physician if you do not have one) for your post hospital discharge needs so that they can reassess your need for medications and monitor your lab values.  Total Time spent coordinating discharge including counseling, education and face to face time equals 35 minutes.  SignedOren Binet 12/18/2018 9:18 AM

## 2018-12-18 NOTE — Progress Notes (Signed)
HH arranged w [facility] per CM notes, notified [facility] of DC today. No other CM needs identified.

## 2018-12-21 ENCOUNTER — Inpatient Hospital Stay: Payer: Self-pay | Admitting: Family Medicine

## 2018-12-21 ENCOUNTER — Ambulatory Visit (INDEPENDENT_AMBULATORY_CARE_PROVIDER_SITE_OTHER): Payer: Medicare Other | Admitting: Family Medicine

## 2018-12-21 ENCOUNTER — Encounter: Payer: Self-pay | Admitting: Family Medicine

## 2018-12-21 VITALS — BP 120/70 | HR 96 | Temp 98.4°F | Resp 16 | Ht 65.0 in | Wt 138.4 lb

## 2018-12-21 DIAGNOSIS — N183 Chronic kidney disease, stage 3 unspecified: Secondary | ICD-10-CM

## 2018-12-21 DIAGNOSIS — Z7901 Long term (current) use of anticoagulants: Secondary | ICD-10-CM | POA: Diagnosis not present

## 2018-12-21 DIAGNOSIS — I5033 Acute on chronic diastolic (congestive) heart failure: Secondary | ICD-10-CM

## 2018-12-21 DIAGNOSIS — K5900 Constipation, unspecified: Secondary | ICD-10-CM

## 2018-12-21 DIAGNOSIS — J690 Pneumonitis due to inhalation of food and vomit: Secondary | ICD-10-CM

## 2018-12-21 DIAGNOSIS — J441 Chronic obstructive pulmonary disease with (acute) exacerbation: Secondary | ICD-10-CM

## 2018-12-21 DIAGNOSIS — J9621 Acute and chronic respiratory failure with hypoxia: Secondary | ICD-10-CM | POA: Diagnosis not present

## 2018-12-21 DIAGNOSIS — Z86718 Personal history of other venous thrombosis and embolism: Secondary | ICD-10-CM | POA: Diagnosis not present

## 2018-12-21 DIAGNOSIS — F411 Generalized anxiety disorder: Secondary | ICD-10-CM | POA: Diagnosis not present

## 2018-12-21 DIAGNOSIS — I2510 Atherosclerotic heart disease of native coronary artery without angina pectoris: Secondary | ICD-10-CM | POA: Diagnosis not present

## 2018-12-21 DIAGNOSIS — R1310 Dysphagia, unspecified: Secondary | ICD-10-CM | POA: Diagnosis not present

## 2018-12-21 DIAGNOSIS — S81811A Laceration without foreign body, right lower leg, initial encounter: Secondary | ICD-10-CM | POA: Diagnosis not present

## 2018-12-21 DIAGNOSIS — Z7902 Long term (current) use of antithrombotics/antiplatelets: Secondary | ICD-10-CM | POA: Diagnosis not present

## 2018-12-21 NOTE — Progress Notes (Signed)
HPI:   Jessica [name] is a 85 y.o. female, who is here today with her daughter to follow on recent hospitalization. She was admitted on 12/14/2018 and discharged home on 12/18/2018 (Saturday,today 2nd business day).  She presented to the ER because of worsening cough, dyspnea, found with acute hypoxia and respiratory failure.  Acute on chronic hypoxic respiratory failure attributed to aspiration pneumonia and COPD exacerbation. According to daughter,she had O2 supplementation "the whole time" she was in the hospital.She usually has O2 through the night.She wonders if she is supposed to use O2 24/7. She denies SOB at rest. She feels better than when she was admitted.  COPD, currently she is on Albuterol inh prn and Pulmicort neb bid.  She follows with pulmonologist, last seen before hospitalization.  Speech therapy was done during hospitalization, she was placed on dysphagia 2 diet.  Completed IV treatment with Unasyn.  She was discharged on prednisone 10 mg daily ,which she is still taking.Also taking Augmentin to complete 4 days.   CHF: Decompensated diastolic dysfunction, she was treated with IV furosemide. Oral furosemide was not deemed necessary at this time. She denies orthopnea or PND.  CKD III, she has not noted foam in urine,gross hematuria,or decreased urine output.  Lab Results  Component Value Date   CREATININE 0.98 12/17/2018   BUN 24 (H) 12/17/2018   NA 140 12/17/2018   K 4.4 12/17/2018   CL 101 12/17/2018   CO2 34 (H) 12/17/2018   Lab Results  Component Value Date   WBC 5.4 12/14/2018   HGB 10.2 (L) 12/14/2018   HCT 34.9 (L) 12/14/2018   MCV 97.5 12/14/2018   PLT 241 12/14/2018   HH has [name] arranged, today speech therapy started. Daughter is not sure about PT.  Appetite is good. She is using a cane at home.  Severe aortic stenosis and CAD,hoping she can have TAVR procedure done in a few weeks. Currently she is on Eliquis 5 mg  bid.  Superficial laceration pretibial area RLE, oozing. She is not sure about trauma, ?scratch.  Anxiety on Lexapro 20 mg and Alprazolam 0.5 mg daily as needed. Reported as stable. Denies suicidal thoughts.  She has had mild constipation,small and hard stools. Last once this morning after taking Miralax yesterday. Negative for nausea,vomiting,abdominal pain.  Review of Systems  Constitutional: Negative for activity change, appetite change, fatigue and fever.  HENT: Negative for mouth sores, nosebleeds and sore throat.   Respiratory: Positive for cough and shortness of breath. Negative for wheezing.   Cardiovascular: Negative for chest pain, palpitations and leg swelling.  Gastrointestinal: Positive for constipation. Negative for abdominal pain, blood in stool, nausea and vomiting.  Genitourinary: Negative for decreased urine volume, dysuria and hematuria.  Musculoskeletal: Positive for arthralgias, gait problem and myalgias.  Skin: Positive for wound. Negative for rash.  Neurological: Negative for syncope, weakness and headaches.  Hematological: Bruises/bleeds easily.  Psychiatric/Behavioral: Negative for confusion. The patient is nervous/anxious.     Current Outpatient Medications on File Prior to Visit  Medication Sig Dispense Refill  . albuterol (PROVENTIL) (2.5 MG/3ML) 0.083% nebulizer solution Take 3 mLs (2.5 mg total) by nebulization every 4 (four) hours as needed for wheezing or shortness of breath. DX: COPD J44.9 75 mL 12  . ALPRAZolam (XANAX) 0.5 MG tablet Take 1 tablet (0.5 mg total) by mouth at bedtime as needed for anxiety. 30 tablet 2  . AMBULATORY NON FORMULARY MEDICATION Medication Name: Incentive spirometer Use 10-15 times per day  1 each 0  . amoxicillin-clavulanate (AUGMENTIN) 500-125 MG tablet Take 1 tablet (500 mg total) by mouth 2 (two) times daily. 8 tablet 0  . arformoterol (BROVANA) 15 MCG/2ML NEBU Take 2 mLs (15 mcg total) by nebulization 2 (two) times daily.  DX: COPD J44.9 (Patient taking differently: Take 15 mcg by nebulization 2 (two) times daily. ) 120 mL 12  . benzonatate (TESSALON) 200 MG capsule Take 1 capsule (200 mg total) by mouth 3 (three) times daily as needed for cough. 20 capsule 0  . budesonide (PULMICORT) 0.5 MG/2ML nebulizer solution Take 2 mLs (0.5 mg total) by nebulization 2 (two) times daily. DX: COPD J44.9 120 mL 12  . Cholecalciferol (VITAMIN D) 1000 UNITS capsule Take 1,000 Units by mouth daily.      . clopidogrel (PLAVIX) 75 MG tablet Take 1 tablet (75 mg total) by mouth daily. 30 tablet 11  . ELIQUIS 5 MG TABS tablet TAKE 1 TABLET BY MOUTH TWICE A DAY 60 tablet 5  . escitalopram (LEXAPRO) 20 MG tablet Take 1 tablet (20 mg total) by mouth daily. 90 tablet 1  . fluticasone (FLONASE) 50 MCG/ACT nasal spray Place 2 sprays into both nostrils daily. 16 g 0  . guaiFENesin (MUCINEX) 600 MG 12 hr tablet Take 1 tablet (600 mg total) by mouth 2 (two) times daily. 14 tablet 0  . loratadine (CLARITIN) 10 MG tablet Take 1 tablet (10 mg total) by mouth daily. 10 tablet 0  . metoprolol tartrate (LOPRESSOR) 25 MG tablet Take 0.5 tablets (12.5 mg total) by mouth 2 (two) times daily. 60 tablet 0  . multivitamin-lutein (OCUVITE-LUTEIN) CAPS capsule Take 1 capsule by mouth daily.    . pantoprazole (PROTONIX) 40 MG tablet Take 1 tablet (40 mg total) by mouth daily at 12 noon. 30 tablet 0  . predniSONE (DELTASONE) 10 MG tablet Take 40 mg daily for 2 days, 30 mg daily for 2 days, 20 mg daily for 2 days,10 mg daily for 1 days, then stop 10 tablet 0  . Respiratory Therapy Supplies (FLUTTER) DEVI 1 each by Does not apply route daily. 1 each 0  . simvastatin (ZOCOR) 20 MG tablet Take 1 tablet (20 mg total) by mouth at bedtime. 90 tablet 3  . traZODone (DESYREL) 50 MG tablet Take 0.5-1 tablets (25-50 mg total) by mouth at bedtime as needed for sleep. 90 tablet 0  . triamcinolone cream (KENALOG) 0.1 % Apply 1 application topically 2 (two) times daily. 45 g 0   . VENTOLIN HFA 108 (90 Base) MCG/ACT inhaler INHALE 2 PUFFS EVERY 6HRS AS NEEDED FOR WHEEZING OR SHORTNESS OF BREATH. DO NOT USE IF USING NEBS. (Patient taking differently: Inhale 2 puffs into the lungs every 6 (six) hours as needed for wheezing. ) 18 Inhaler 2   No current facility-administered medications on file prior to visit.      Past Medical History:  Diagnosis Date  . Anxiety   . congenital nystagmus   . COPD (chronic obstructive pulmonary disease) ([place])   . Coronary artery disease   . DJD (degenerative joint disease)   . Fibromyalgia   . Low back pain syndrome   . Memory loss   . Other and unspecified hyperlipidemia   . Severe aortic stenosis   . Venous insufficiency    Allergies  Allergen Reactions  . Contrast Media [Iodinated Diagnostic Agents] Itching, Rash and Other (See Comments)    Hypotension, Skin turns red like a sunburn  . Iohexol Itching, Rash and Other (See  Comments)    Hypotension, Skin turns red like a sunburn    Social History   Socioeconomic History  . Marital status: Widowed    Spouse name: Not on file  . Number of children: 2  . Years of education: Not on file  . Highest education level: Not on file  Occupational History  . Occupation: [employer]  Social Needs  . Financial resource strain: Not on file  . Food insecurity:    Worry: Not on file    Inability: Not on file  . Transportation needs:    Medical: Not on file    Non-medical: Not on file  Tobacco Use  . Smoking status: Former Smoker    Packs/day: 2.00    Years: 30.00    Pack years: 60.00    Types: Cigarettes    Last attempt to quit: 10/28/1991    Years since quitting: 27.1  . Smokeless tobacco: Never Used  Substance and Sexual Activity  . Alcohol use: Yes    Comment: 1 drink per night  . Drug use: No  . Sexual activity: Not on file  Lifestyle  . Physical activity:    Days per week: Not on file    Minutes per session: Not on file  . Stress: Not on file   Relationships  . Social connections:    Talks on phone: Not on file    Gets together: Not on file    Attends religious service: Not on file    Active member of club or organization: Not on file    Attends meetings of clubs or organizations: Not on file    Relationship status: Not on file  Other Topics Concern  . Not on file  Social History Narrative   1 sibling alive age 46   1 sibling alive age 52   1 sibling deceased age 39   1 sibling alive age 41    Vitals:   12/21/18 1447  BP: 120/70  Pulse: 96  Resp: 16  Temp: 98.4 F (36.9 C)  SpO2: 90%   Body mass index is 23.03 kg/m.  Wt Readings from Last 3 Encounters:  12/21/18 138 lb 6 oz (62.8 kg)  12/15/18 134 lb 6.4 oz (61 kg)  12/14/18 141 lb 3.2 oz (64 kg)     Physical Exam  Nursing note and vitals reviewed. Constitutional: She appears well-developed and well-nourished. No distress.  HENT:  Head: Normocephalic and atraumatic.  Right Ear: Decreased hearing is noted.  Left Ear: Decreased hearing is noted.  Mouth/Throat: Oropharynx is clear and moist and mucous membranes are normal. She has dentures.  Eyes: Pupils are equal, round, and reactive to light. Conjunctivae are normal.  Cardiovascular: Normal rate and regular rhythm.  Murmur (SEM III-VI RUSB) heard. Pulses:      Dorsalis pedis pulses are 2+ on the right side and 2+ on the left side.  Respiratory: Effort normal and breath sounds normal. No respiratory distress.  GI: Soft. There is no abdominal tenderness.  Musculoskeletal:        General: No edema.  Lymphadenopathy:    She has no cervical adenopathy.  Neurological: She is alert. She has normal strength. No cranial nerve deficit.  Oriented in place and person. Unstable gait. Today she is in a wheel chair.  Skin: Skin is warm. Abrasion noted. No rash noted. No erythema.     Right pretibial area with 1.5 linear laceration,active bleeding. There is no tenderness.  Psychiatric: She has a normal mood  and affect.  Well groomed, good eye contact.    ASSESSMENT AND PLAN:  [name] was seen today for hospitalization follow-up.   Orders Placed This Encounter  Procedures  . CBC  . Basic metabolic panel    Lab Results  Component Value Date   WBC 6.5 12/21/2018   HGB 11.3 (L) 12/21/2018   HCT 34.6 (L) 12/21/2018   MCV 91.0 12/21/2018   PLT 262.0 12/21/2018   Lab Results  Component Value Date   CREATININE 1.09 12/21/2018   BUN 20 12/21/2018   NA 141 12/21/2018   K 4.1 12/21/2018   CL 103 12/21/2018   CO2 27 12/21/2018     Constipation, unspecified constipation type Continue OTC Miralax daily as needed. Adequate hydration and fiber intake. Instructed about warning signs.  Acute on chronic diastolic (congestive) heart failure (HCC) Asymptomatic at this time. 05/2018 LVEF 55-60% and grade 1 diastolic dysfunction. No changes in Metoprolol. Low salt diet to continue. Keep next appt with cardiologist.  COPD exacerbation ([place]) Symptoms improved. No changes in current management. Continue following with pulmonologist.  Aspiration pneumonia due to regurgitated food, unspecified laterality, unspecified part of lung ([place]) Symptoms resolved. Speech therapy at home. Continue dietary recommendations.   -     CBC -     Basic metabolic panel  Generalized anxiety disorder Stable. No changes in current management. Keep next follow up appt.  CKD (chronic kidney disease), stage III (HCC) Low salt diet. Adequate hydration. Continue avoiding NSAID's.  Noninfected skin tear of leg, right, initial encounter Hemostasis with local pressure. Non sticky pad on area and sticky gause around leg placed. Instructed about wound care,keep it clean with soap and water. Instructed about warning signs and f/u as needed.   Return in about 4 months (around 04/21/2019).    Chidera Thivierge G. [name], MD  [facility]. [place] office.

## 2018-12-21 NOTE — Patient Instructions (Signed)
A few things to remember from today's visit:   Acute on chronic diastolic (congestive) heart failure (HCC)  COPD exacerbation (HCC)  Aspiration pneumonia due to regurgitated food, unspecified laterality, unspecified part of lung ([place]) - Plan: CBC, Basic metabolic panel  Constipation, unspecified constipation type  Generalized anxiety disorder  CKD (chronic kidney disease), stage III (HCC)  Continue low-salt diet. I am placing a referral for home PT. No changes on your medications.  Please be sure medication list is accurate. If a new problem present, please set up appointment sooner than planned today.

## 2018-12-22 ENCOUNTER — Telehealth: Payer: Self-pay | Admitting: Family Medicine

## 2018-12-22 DIAGNOSIS — R1310 Dysphagia, unspecified: Secondary | ICD-10-CM | POA: Diagnosis not present

## 2018-12-22 DIAGNOSIS — I2510 Atherosclerotic heart disease of native coronary artery without angina pectoris: Secondary | ICD-10-CM | POA: Diagnosis not present

## 2018-12-22 DIAGNOSIS — J441 Chronic obstructive pulmonary disease with (acute) exacerbation: Secondary | ICD-10-CM | POA: Diagnosis not present

## 2018-12-22 DIAGNOSIS — J690 Pneumonitis due to inhalation of food and vomit: Secondary | ICD-10-CM | POA: Diagnosis not present

## 2018-12-22 DIAGNOSIS — J9621 Acute and chronic respiratory failure with hypoxia: Secondary | ICD-10-CM | POA: Diagnosis not present

## 2018-12-22 DIAGNOSIS — I5033 Acute on chronic diastolic (congestive) heart failure: Secondary | ICD-10-CM | POA: Diagnosis not present

## 2018-12-22 LAB — CBC
HCT: 34.6 % — ABNORMAL LOW (ref 36.0–46.0)
Hemoglobin: 11.3 g/dL — ABNORMAL LOW (ref 12.0–15.0)
MCHC: 32.6 g/dL (ref 30.0–36.0)
MCV: 91 fl (ref 78.0–100.0)
Platelets: 262 10*3/uL (ref 150.0–400.0)
RBC: 3.8 Mil/uL — ABNORMAL LOW (ref 3.87–5.11)
RDW: 17.1 % — ABNORMAL HIGH (ref 11.5–15.5)
WBC: 6.5 10*3/uL (ref 4.0–10.5)

## 2018-12-22 LAB — BASIC METABOLIC PANEL
BUN: 20 mg/dL (ref 6–23)
CO2: 27 mEq/L (ref 19–32)
Calcium: 9.3 mg/dL (ref 8.4–10.5)
Chloride: 103 mEq/L (ref 96–112)
Creatinine, Ser: 1.09 mg/dL (ref 0.40–1.20)
GFR: 47.06 mL/min — ABNORMAL LOW (ref >60.00)
Glucose, Bld: 128 mg/dL — ABNORMAL HIGH (ref 70–99)
POTASSIUM: 4.1 meq/L (ref 3.5–5.1)
Sodium: 141 mEq/L (ref 135–145)

## 2018-12-22 NOTE — Telephone Encounter (Signed)
Spoke with Ronny Bacon and gave verbal orders for home health as requested.

## 2018-12-22 NOTE — Telephone Encounter (Signed)
Copied from [place] [id number]. Topic: Quick Communication - Home Health Verbal Orders >> Dec 22, 2018  1:21 PM Rutherford Nail, [place] wrote: Caller/Agency: Ronny Bacon, [place] Number: [phone] (can leave voicemail) Requesting OT/PT/Skilled Nursing/Social Work: Nursing Frequency:  2x a week for 1 week 1x a week for 7 weeks 2 PRN visits

## 2018-12-25 ENCOUNTER — Encounter: Payer: Self-pay | Admitting: Family Medicine

## 2018-12-27 ENCOUNTER — Telehealth: Payer: Self-pay | Admitting: Family Medicine

## 2018-12-27 DIAGNOSIS — I5033 Acute on chronic diastolic (congestive) heart failure: Secondary | ICD-10-CM | POA: Diagnosis not present

## 2018-12-27 DIAGNOSIS — J9621 Acute and chronic respiratory failure with hypoxia: Secondary | ICD-10-CM | POA: Diagnosis not present

## 2018-12-27 DIAGNOSIS — J690 Pneumonitis due to inhalation of food and vomit: Secondary | ICD-10-CM | POA: Diagnosis not present

## 2018-12-27 DIAGNOSIS — J441 Chronic obstructive pulmonary disease with (acute) exacerbation: Secondary | ICD-10-CM | POA: Diagnosis not present

## 2018-12-27 DIAGNOSIS — I2510 Atherosclerotic heart disease of native coronary artery without angina pectoris: Secondary | ICD-10-CM | POA: Diagnosis not present

## 2018-12-27 DIAGNOSIS — R1310 Dysphagia, unspecified: Secondary | ICD-10-CM | POA: Diagnosis not present

## 2018-12-27 NOTE — Telephone Encounter (Unsigned)
Copied from [place] [id number]. Topic: Quick Communication - Home Health Verbal Orders >> Dec 27, 2018  2:58 PM Carolyn Stare wrote: Caller/Agency  Frankie with [facility]    Callback Number [phone]   Requesting VERBAL FOR PT   Frequency   2 X 1    1 X 3

## 2018-12-28 NOTE — Telephone Encounter (Signed)
Left message to return call to clinic for verbal orders for PT.

## 2018-12-29 ENCOUNTER — Emergency Department (HOSPITAL_COMMUNITY)
Admission: EM | Admit: 2018-12-29 | Discharge: 2018-12-29 | Disposition: A | Discharge status: Home / Self Care | Payer: Medicare Other | Attending: Emergency Medicine | Admitting: Emergency Medicine

## 2018-12-29 ENCOUNTER — Ambulatory Visit: Payer: Self-pay

## 2018-12-29 ENCOUNTER — Other Ambulatory Visit: Payer: Self-pay

## 2018-12-29 DIAGNOSIS — Z86711 Personal history of pulmonary embolism: Secondary | ICD-10-CM | POA: Insufficient documentation

## 2018-12-29 DIAGNOSIS — E860 Dehydration: Secondary | ICD-10-CM | POA: Insufficient documentation

## 2018-12-29 DIAGNOSIS — J441 Chronic obstructive pulmonary disease with (acute) exacerbation: Secondary | ICD-10-CM | POA: Diagnosis not present

## 2018-12-29 DIAGNOSIS — Z87891 Personal history of nicotine dependence: Secondary | ICD-10-CM | POA: Diagnosis not present

## 2018-12-29 DIAGNOSIS — N183 Chronic kidney disease, stage 3 (moderate): Secondary | ICD-10-CM | POA: Diagnosis not present

## 2018-12-29 DIAGNOSIS — N3000 Acute cystitis without hematuria: Secondary | ICD-10-CM | POA: Insufficient documentation

## 2018-12-29 DIAGNOSIS — J449 Chronic obstructive pulmonary disease, unspecified: Secondary | ICD-10-CM | POA: Diagnosis not present

## 2018-12-29 DIAGNOSIS — R1310 Dysphagia, unspecified: Secondary | ICD-10-CM | POA: Diagnosis not present

## 2018-12-29 DIAGNOSIS — I5033 Acute on chronic diastolic (congestive) heart failure: Secondary | ICD-10-CM | POA: Diagnosis not present

## 2018-12-29 DIAGNOSIS — I2510 Atherosclerotic heart disease of native coronary artery without angina pectoris: Secondary | ICD-10-CM | POA: Diagnosis not present

## 2018-12-29 DIAGNOSIS — Z79899 Other long term (current) drug therapy: Secondary | ICD-10-CM | POA: Insufficient documentation

## 2018-12-29 DIAGNOSIS — J9621 Acute and chronic respiratory failure with hypoxia: Secondary | ICD-10-CM | POA: Diagnosis not present

## 2018-12-29 DIAGNOSIS — J690 Pneumonitis due to inhalation of food and vomit: Secondary | ICD-10-CM | POA: Diagnosis not present

## 2018-12-29 DIAGNOSIS — I5032 Chronic diastolic (congestive) heart failure: Secondary | ICD-10-CM | POA: Insufficient documentation

## 2018-12-29 LAB — URINALYSIS, ROUTINE W REFLEX MICROSCOPIC
BILIRUBIN URINE: NEGATIVE
Glucose, UA: NEGATIVE mg/dL
Hgb urine dipstick: NEGATIVE
Ketones, ur: NEGATIVE mg/dL
Nitrite: NEGATIVE
Protein, ur: NEGATIVE mg/dL
Specific Gravity, Urine: 1.016 (ref 1.005–1.030)
pH: 5 (ref 5.0–8.0)

## 2018-12-29 LAB — BASIC METABOLIC PANEL
Anion gap: 8 (ref 5–15)
BUN: 13 mg/dL (ref 8–23)
CHLORIDE: 105 mmol/L (ref 98–111)
CO2: 23 mmol/L (ref 22–32)
Calcium: 9 mg/dL (ref 8.9–10.3)
Creatinine, Ser: 1.05 mg/dL — ABNORMAL HIGH (ref 0.44–1.00)
GFR calc Af Amer: 54 mL/min — ABNORMAL LOW (ref >60)
GFR calc non Af Amer: 47 mL/min — ABNORMAL LOW (ref >60)
Glucose, Bld: 104 mg/dL — ABNORMAL HIGH (ref 70–99)
Potassium: 4.1 mmol/L (ref 3.5–5.1)
Sodium: 136 mmol/L (ref 135–145)

## 2018-12-29 LAB — CBC
HCT: 36 % (ref 36.0–46.0)
Hemoglobin: 10.7 g/dL — ABNORMAL LOW (ref 12.0–15.0)
MCH: 28.2 pg (ref 26.0–34.0)
MCHC: 29.7 g/dL — ABNORMAL LOW (ref 30.0–36.0)
MCV: 94.7 fL (ref 80.0–100.0)
Platelets: 242 10*3/uL (ref 150–400)
RBC: 3.8 MIL/uL — ABNORMAL LOW (ref 3.87–5.11)
RDW: 16.4 % — ABNORMAL HIGH (ref 11.5–15.5)
WBC: 8.4 10*3/uL (ref 4.0–10.5)
nRBC: 0 % (ref 0.0–0.2)

## 2018-12-29 MED ORDER — CEPHALEXIN 500 MG PO CAPS: 500.0000 mg | ORAL_CAPSULE | Freq: Four times a day (QID) | ORAL | 0 refills | Status: DC

## 2018-12-29 MED ORDER — SODIUM CHLORIDE 0.9 % IV SOLN
1.0000 g | Freq: Once | INTRAVENOUS | Status: AC
  Administered 2018-12-29: 1 g via INTRAVENOUS
  Filled 2018-12-29: qty 10

## 2018-12-29 MED ORDER — SODIUM CHLORIDE 0.9 % IV BOLUS
1000.0000 mL | Freq: Once | INTRAVENOUS | Status: AC
  Administered 2018-12-29: 1000 mL via INTRAVENOUS

## 2018-12-29 NOTE — Telephone Encounter (Signed)
Pt at [facility] ED. 

## 2018-12-29 NOTE — Telephone Encounter (Signed)
Will monitor for ED arrival.  

## 2018-12-29 NOTE — ED Triage Notes (Signed)
Patient reports persistent generalized weakness d/t dehydration - seen at PCP office Monday and was told to increase fluid intake, but hasn't been drinking/eating like she should - recently admitted for aspiration pna and now has to thicken liquids. Denies pain, shortness of breath, dizziness.

## 2018-12-29 NOTE — ED Provider Notes (Signed)
[facility] EMERGENCY DEPARTMENT Provider Note   CSN: [id number] Arrival date & time: 12/29/18  1244    History   Chief Complaint Chief Complaint  Patient presents with  . Dehydration    HPI Jessica [name] is a 85 y.o. female.     HPI Patient reports that she was told by her home health nurse that her "blood was low".  She describes having not been assessed at home and her nurse was concerned and called her doctor who recommended she come to the emergency department for assessment.  She does not describe having had any lab work drawn.  The patient reports that she felt fine.  She denies she has any pain, general weakness.  She does not feel that she has had fever or body aches.  Patient reports that because of problems with swallowing she has to eat and drink and extremely small amounts.  She reports she does keep something at her side for sipping small amounts although she is supposed to have thickened liquids.  She reports she does eat but just very small bits.  She was sent to the emergency department with concern for dehydration. Past Medical History:  Diagnosis Date  . Anxiety   . congenital nystagmus   . COPD (chronic obstructive pulmonary disease) ([place])   . Coronary artery disease   . DJD (degenerative joint disease)   . Fibromyalgia   . Low back pain syndrome   . Memory loss   . Other and unspecified hyperlipidemia   . Severe aortic stenosis   . Venous insufficiency     Patient Active Problem List   Diagnosis Date Noted  . PE (pulmonary thromboembolism) ([place]) 12/15/2018  . Acute on chronic respiratory failure with hypoxia ([place]) 12/15/2018  . Depression 12/15/2018  . CAD (coronary artery disease) 12/15/2018  . CKD (chronic kidney disease), stage III ([place]) 12/15/2018  . Acute on chronic diastolic (congestive) heart failure ([place]) 12/15/2018  . Acute pulmonary embolism ([place]) 08/25/2018  . Atherosclerosis of native coronary artery of native heart  with stable angina pectoris ([place]) 07/23/2018  . Severe aortic stenosis   . Aortic atherosclerosis ([place]) 05/20/2018  . Insomnia 04/08/2016  . Chronic bronchitis ([place]) 03/10/2016  . COPD (chronic obstructive pulmonary disease) ([place]) 05/07/2015  . Labial cyst 05/07/2015  . COPD exacerbation ([place]) 12/20/2014  . Murmur 11/08/2012  . PULMONARY NODULE 01/06/2011  . Vitamin D deficiency 01/14/2009  . Hyperlipidemia 01/14/2009  . DEGENERATIVE JOINT DISEASE 06/12/2008  . LOW BACK PAIN SYNDROME 12/15/2007  . FIBROMYALGIA 12/15/2007  . Generalized anxiety disorder 12/14/2007    Past Surgical History:  Procedure Laterality Date  . ABDOMINAL HYSTERECTOMY    . ANTERIOR CERVICAL DISCECTOMY    . CARPAL TUNNEL RELEASE  12/2011   right arm  . CATARACT EXTRACTION    . CORONARY ATHERECTOMY  07/23/2018   PTCA/orbital atherectomy/DES x 1 proximal to mid LAD  . CORONARY ATHERECTOMY N/A 07/23/2018   Procedure: CORONARY ATHERECTOMY;  Surgeon: Burnell Blanks, MD;  Location: [facility];  Service: Cardiovascular;  Laterality: N/A;  . CORONARY STENT INTERVENTION    . CORONARY STENT INTERVENTION N/A 07/23/2018   Procedure: CORONARY STENT INTERVENTION;  Surgeon: Burnell Blanks, MD;  Location: [facility];  Service: Cardiovascular;  Laterality: N/A;  . LUMBAR LAMINECTOMY    . right knee arthroscopy    . right shoulder replacement  04/2009  . right shoulder surgery  2008   Dr. Noemi Chapel  . RIGHT/LEFT  HEART CATH AND CORONARY ANGIOGRAPHY N/A 07/07/2018   Procedure: RIGHT/LEFT HEART CATH AND CORONARY ANGIOGRAPHY;  Surgeon: Burnell Blanks, MD;  Location: [facility];  Service: Cardiovascular;  Laterality: N/A;  . ULTRASOUND GUIDANCE FOR VASCULAR ACCESS  07/07/2018   Procedure: Ultrasound Guidance For Vascular Access;  Surgeon: Burnell Blanks, MD;  Location: [facility];  Service: Cardiovascular;;     OB History   No obstetric history on file.       Home Medications    Prior to Admission medications   Medication Sig Start Date End Date Taking? Authorizing Provider  albuterol (PROVENTIL) (2.5 MG/3ML) 0.083% nebulizer solution Take 3 mLs (2.5 mg total) by nebulization every 4 (four) hours as needed for wheezing or shortness of breath. DX: COPD J44.9 04/12/18   Flora Lipps, MD  ALPRAZolam Duanne Moron) 0.5 MG tablet Take 1 tablet (0.5 mg total) by mouth at bedtime as needed for anxiety. 10/06/18   [name], [name], MD  AMBULATORY NON FORMULARY MEDICATION Medication Name: Incentive spirometer Use 10-15 times per day 12/14/18   Flora Lipps, MD  amoxicillin-clavulanate (AUGMENTIN) 500-125 MG tablet Take 1 tablet (500 mg total) by mouth 2 (two) times daily. 12/18/18   Ghimire, Henreitta Leber, MD  arformoterol (BROVANA) 15 MCG/2ML NEBU Take 2 mLs (15 mcg total) by nebulization 2 (two) times daily. DX: COPD J44.9 Patient taking differently: Take 15 mcg by nebulization 2 (two) times daily.  07/27/17   Flora Lipps, MD  benzonatate (TESSALON) 200 MG capsule Take 1 capsule (200 mg total) by mouth 3 (three) times daily as needed for cough. 12/18/18   Ghimire, Henreitta Leber, MD  budesonide (PULMICORT) 0.5 MG/2ML nebulizer solution Take 2 mLs (0.5 mg total) by nebulization 2 (two) times daily. DX: COPD J44.9 07/09/17   Flora Lipps, MD  cephALEXin (KEFLEX) 500 MG capsule Take 1 capsule (500 mg total) by mouth 4 (four) times daily. 12/29/18   Charlesetta Shanks, MD  Cholecalciferol (VITAMIN D) 1000 UNITS capsule Take 1,000 Units by mouth daily.      [provider]  clopidogrel (PLAVIX) 75 MG tablet Take 1 tablet (75 mg total) by mouth daily. 07/13/18 07/13/19  Burnell Blanks, MD  ELIQUIS 5 MG TABS tablet TAKE 1 TABLET BY MOUTH TWICE A DAY 11/19/18   [name], MD  escitalopram (LEXAPRO) 20 MG tablet Take 1 tablet (20 mg total) by mouth daily. 06/30/18   [name], [name], MD  fluticasone (FLONASE) 50 MCG/ACT nasal spray Place 2 sprays into both nostrils  daily. 12/18/18   Ghimire, Henreitta Leber, MD  guaiFENesin (MUCINEX) 600 MG 12 hr tablet Take 1 tablet (600 mg total) by mouth 2 (two) times daily. 12/18/18   Ghimire, Henreitta Leber, MD  loratadine (CLARITIN) 10 MG tablet Take 1 tablet (10 mg total) by mouth daily. 12/18/18   Ghimire, Henreitta Leber, MD  metoprolol tartrate (LOPRESSOR) 25 MG tablet Take 0.5 tablets (12.5 mg total) by mouth 2 (two) times daily. 12/18/18   Ghimire, Henreitta Leber, MD  multivitamin-lutein (OCUVITE-LUTEIN) CAPS capsule Take 1 capsule by mouth daily.    [provider]  pantoprazole (PROTONIX) 40 MG tablet Take 1 tablet (40 mg total) by mouth daily at 12 noon. 12/18/18   Ghimire, Henreitta Leber, MD  predniSONE (DELTASONE) 10 MG tablet Take 40 mg daily for 2 days, 30 mg daily for 2 days, 20 mg daily for 2 days,10 mg daily for 1 days, then stop 12/18/18   Jonetta Osgood, MD  Respiratory Therapy Supplies (FLUTTER) DEVI 1 each by Does not apply route daily. 12/14/18   Flora Lipps, MD  simvastatin (ZOCOR) 20 MG tablet Take 1 tablet (20 mg total) by mouth at bedtime. 12/23/17   [name], MD  traZODone (DESYREL) 50 MG tablet Take 0.5-1 tablets (25-50 mg total) by mouth at bedtime as needed for sleep. 06/30/18   [name], [name], MD  triamcinolone cream (KENALOG) 0.1 % Apply 1 application topically 2 (two) times daily. 11/12/18   [name], [name], MD  VENTOLIN HFA 108 (90 Base) MCG/ACT inhaler INHALE 2 PUFFS EVERY 6HRS AS NEEDED FOR WHEEZING OR SHORTNESS OF BREATH. DO NOT USE IF USING NEBS. Patient taking differently: Inhale 2 puffs into the lungs every 6 (six) hours as needed for wheezing.  11/17/18   Flora Lipps, MD    Family History Family History  Problem Relation Age of Onset  . Other Mother        broken hip  . Other Father        kidney problems    Social History Social History   Tobacco Use  . Smoking status: Former Smoker    Packs/day: 2.00    Years: 30.00    Pack years: 60.00    Types: Cigarettes    Last attempt to  quit: 10/28/1991    Years since quitting: 27.1  . Smokeless tobacco: Never Used  Substance Use Topics  . Alcohol use: Yes    Comment: 1 drink per night  . Drug use: No     Allergies   Contrast media [iodinated diagnostic agents] and Iohexol   Review of Systems Review of Systems 10 Systems reviewed and are negative for acute change except as noted in the HPI.   Physical Exam Updated Vital Signs BP (!) 105/52 (BP Location: Right Arm)   Pulse 66   Temp 98.2 F (36.8 C) (Oral)   Resp 16   Ht 5\' 4"  (1.626 m)   Wt 63.5 kg   SpO2 96%   BMI 24.03 kg/m   Physical Exam Constitutional:      Comments: Patient is alert and clinically well in appearance.  She is nontoxic.  Mental status interactive.  No respiratory distress at rest.  Color is good.  HENT:     Head: Normocephalic and atraumatic.     Mouth/Throat:     Mouth: Mucous membranes are moist.  Eyes:     Extraocular Movements: Extraocular movements intact.  Cardiovascular:     Pulses: Normal pulses.     Comments: Heart regular.  2\6 systolic ejection murmur. Pulmonary:     Effort: Pulmonary effort is normal.     Breath sounds: Normal breath sounds.  Abdominal:     General: There is no distension.     Palpations: Abdomen is soft.     Tenderness: There is no abdominal tenderness. There is no guarding.  Musculoskeletal: Normal range of motion.        General: No swelling or tenderness.  Skin:    General: Skin is warm and dry.  Neurological:     General: No focal deficit present.     Cranial Nerves: No cranial nerve deficit.     Coordination: Coordination normal.  Psychiatric:        Mood and Affect: Mood normal.      ED Treatments / Results  Labs (all labs ordered are listed, but only abnormal results are displayed) Labs Reviewed  BASIC METABOLIC PANEL - Abnormal; Notable for the following  components:      Result Value   Glucose, Bld 104 (*)    Creatinine, Ser 1.05 (*)    GFR calc non Af Amer 47 (*)     GFR calc Af Amer 54 (*)    All other components within normal limits  CBC - Abnormal; Notable for the following components:   RBC 3.80 (*)    Hemoglobin 10.7 (*)    MCHC 29.7 (*)    RDW 16.4 (*)    All other components within normal limits  URINALYSIS, ROUTINE W REFLEX MICROSCOPIC - Abnormal; Notable for the following components:   Leukocytes,Ua TRACE (*)    Bacteria, UA RARE (*)    All other components within normal limits  URINE CULTURE    EKG None  Radiology No results found.  Procedures Procedures (including critical care time)  Medications Ordered in ED Medications  cefTRIAXone (ROCEPHIN) 1 g in sodium chloride 0.9 % 100 mL IVPB (1 g Intravenous New Bag/Given 12/29/18 1824)  sodium chloride 0.9 % bolus 1,000 mL (0 mLs Intravenous Stopped 12/29/18 1711)     Initial Impression / Assessment and Plan / ED Course  I have reviewed the triage vital signs and the nursing notes.  Pertinent labs & imaging results that were available during my care of the patient were reviewed by me and considered in my medical decision making (see chart for details).       Patient appears to be in excellent condition.  She is alert and well in appearance.  She does have known problems with aspiration and swallowing.  She did describe concerned that her "blood was low" by her nurse visit today.  Patient did not have labs drawn I conclude this was hypotension.  Patient does have a positive orthostatic hypotension.  She however is been up and ambulatory with no signs of syncope or near syncope.  Patient is rehydrated and basic labs checked with no significant abnormalities.  Urinalysis obtained is trace positive.  Stable to start treatment at home.  Return precautions reviewed.  Final Clinical Impressions(s) / ED Diagnoses   Final diagnoses:  Dehydration  Acute cystitis without hematuria    ED Discharge Orders         Ordered    cephALEXin (KEFLEX) 500 MG capsule  4 times daily     12/29/18  1846           Charlesetta Shanks, MD 01/09/19 1413

## 2018-12-29 NOTE — ED Notes (Signed)
Pt left with daughter in [facility]. Pt and daughter v erbalized understanding of DC teaching and have no questions at this time

## 2018-12-29 NOTE — Telephone Encounter (Signed)
Left detailed message with verbal orders as requested. Advised to call office with any concerns or questions.

## 2018-12-29 NOTE — Telephone Encounter (Signed)
Alonna Minium, ST with [facility] called and says the patient had a sitting BP 92/50 in both arms and standing 53/35 in left arm. She says the patient says she's dizzy and needed to sit down, the patient is having SOB. She says these reading were taken right before she called. I asked her to check it now and it was sitting 85/52. Ebony Hail says the patient took her Metoprolol about 1 hours ago as it was reported to her. I asked is anyone with the patient now and how is the patient acting right now. She says the patient is sitting, alert and talking, says she's SOB and the patient reported being dizzy. She says her daughter just left to run an errand, but was coming back. I advised the patient will need to be taken to the ED because of the low BP, dizziness, SOB, she may need IVF's. She says the patient's daughter says that her recent blood work showed dehydration and asked would the patient increasing her fluid intake help. I advised at this point, she may need IVF's and her BP was more than likely already low before she took her medication and by it being so low, she will need evaluation in the ED. Ebony Hail says she will stay with the patient and let her daughter know when she comes back. She asks what if they refuse to go, what should she do. I advised to have the daughter to call the office and we will give her the recommendation or transfer the call to the office so that she can hear it from them as well, she verbalized understanding and says she will give the daughter the message.  Reason for Disposition . [4] Fall in systolic BP > 20 mm Hg from normal AND [2] dizzy, lightheaded, or weak  Answer Assessment - Initial Assessment Questions 1. BLOOD PRESSURE: "What is the blood pressure?" "Did you take at least two measurements 5 minutes apart?"     85/52 sitting 2. ONSET: "When did you take your blood pressure?"     Now 3. HOW: "How did you obtain the blood pressure?" (e.g., visiting nurse,  automatic home BP monitor)     Manual cuff by Speech Therapist 4. HISTORY: "Do you have a history of low blood pressure?" "What is your blood pressure normally?"     N/A 5. MEDICATIONS: "Are you taking any medications for blood pressure?" If yes: "Have they been changed recently?"     Yes, took Metoprolol 1 hour ago 6. PULSE RATE: "Do you know what your pulse rate is?"      66 7. OTHER SYMPTOMS: "Have you been sick recently?" "Have you had a recent injury?"     Dizzy, SOB 8. PREGNANCY: "Is there any chance you are pregnant?" "When was your last menstrual period?"     N/A  Protocols used: LOW BLOOD PRESSURE-A-AH

## 2018-12-30 ENCOUNTER — Telehealth: Payer: Self-pay | Admitting: Family Medicine

## 2018-12-30 DIAGNOSIS — R1310 Dysphagia, unspecified: Secondary | ICD-10-CM | POA: Diagnosis not present

## 2018-12-30 DIAGNOSIS — J441 Chronic obstructive pulmonary disease with (acute) exacerbation: Secondary | ICD-10-CM | POA: Diagnosis not present

## 2018-12-30 DIAGNOSIS — J9621 Acute and chronic respiratory failure with hypoxia: Secondary | ICD-10-CM | POA: Diagnosis not present

## 2018-12-30 DIAGNOSIS — I5033 Acute on chronic diastolic (congestive) heart failure: Secondary | ICD-10-CM | POA: Diagnosis not present

## 2018-12-30 DIAGNOSIS — J690 Pneumonitis due to inhalation of food and vomit: Secondary | ICD-10-CM | POA: Diagnosis not present

## 2018-12-30 DIAGNOSIS — I2510 Atherosclerotic heart disease of native coronary artery without angina pectoris: Secondary | ICD-10-CM | POA: Diagnosis not present

## 2018-12-30 LAB — URINE CULTURE: CULTURE: NO GROWTH

## 2018-12-30 NOTE — Telephone Encounter (Signed)
Copied from [place] [id number]. Topic: Quick Communication - See Telephone Encounter >> Dec 30, 2018 12:14 PM Bea Graff, NT wrote: CRM for notification. See Telephone encounter for: 12/30/18. Cindra Eves with [facility] states pts blood pressure is better today. Reading 100/58. Pt dx with a UTI at the ER yesterday. Cindra Eves advised pt to increase fluid intake. CB#: [phone]

## 2018-12-31 NOTE — Telephone Encounter (Signed)
Aware. Jessica Kring [name], MD

## 2019-01-04 DIAGNOSIS — R1310 Dysphagia, unspecified: Secondary | ICD-10-CM | POA: Diagnosis not present

## 2019-01-04 DIAGNOSIS — J690 Pneumonitis due to inhalation of food and vomit: Secondary | ICD-10-CM | POA: Diagnosis not present

## 2019-01-04 DIAGNOSIS — J441 Chronic obstructive pulmonary disease with (acute) exacerbation: Secondary | ICD-10-CM | POA: Diagnosis not present

## 2019-01-04 DIAGNOSIS — I5033 Acute on chronic diastolic (congestive) heart failure: Secondary | ICD-10-CM | POA: Diagnosis not present

## 2019-01-04 DIAGNOSIS — S20212A Contusion of left front wall of thorax, initial encounter: Secondary | ICD-10-CM | POA: Diagnosis not present

## 2019-01-04 DIAGNOSIS — I2510 Atherosclerotic heart disease of native coronary artery without angina pectoris: Secondary | ICD-10-CM | POA: Diagnosis not present

## 2019-01-04 DIAGNOSIS — J9621 Acute and chronic respiratory failure with hypoxia: Secondary | ICD-10-CM | POA: Diagnosis not present

## 2019-01-05 DIAGNOSIS — R1310 Dysphagia, unspecified: Secondary | ICD-10-CM | POA: Diagnosis not present

## 2019-01-05 DIAGNOSIS — J441 Chronic obstructive pulmonary disease with (acute) exacerbation: Secondary | ICD-10-CM | POA: Diagnosis not present

## 2019-01-05 DIAGNOSIS — I5033 Acute on chronic diastolic (congestive) heart failure: Secondary | ICD-10-CM | POA: Diagnosis not present

## 2019-01-05 DIAGNOSIS — J690 Pneumonitis due to inhalation of food and vomit: Secondary | ICD-10-CM | POA: Diagnosis not present

## 2019-01-05 DIAGNOSIS — J9621 Acute and chronic respiratory failure with hypoxia: Secondary | ICD-10-CM | POA: Diagnosis not present

## 2019-01-05 DIAGNOSIS — I2510 Atherosclerotic heart disease of native coronary artery without angina pectoris: Secondary | ICD-10-CM | POA: Diagnosis not present

## 2019-01-10 DIAGNOSIS — J441 Chronic obstructive pulmonary disease with (acute) exacerbation: Secondary | ICD-10-CM | POA: Diagnosis not present

## 2019-01-10 DIAGNOSIS — I5033 Acute on chronic diastolic (congestive) heart failure: Secondary | ICD-10-CM | POA: Diagnosis not present

## 2019-01-10 DIAGNOSIS — J690 Pneumonitis due to inhalation of food and vomit: Secondary | ICD-10-CM | POA: Diagnosis not present

## 2019-01-10 DIAGNOSIS — I2510 Atherosclerotic heart disease of native coronary artery without angina pectoris: Secondary | ICD-10-CM | POA: Diagnosis not present

## 2019-01-10 DIAGNOSIS — R1310 Dysphagia, unspecified: Secondary | ICD-10-CM | POA: Diagnosis not present

## 2019-01-10 DIAGNOSIS — J9621 Acute and chronic respiratory failure with hypoxia: Secondary | ICD-10-CM | POA: Diagnosis not present

## 2019-01-11 ENCOUNTER — Other Ambulatory Visit: Payer: Self-pay | Admitting: *Deleted

## 2019-01-11 ENCOUNTER — Ambulatory Visit: Payer: Self-pay | Admitting: *Deleted

## 2019-01-11 DIAGNOSIS — E8770 Fluid overload, unspecified: Secondary | ICD-10-CM

## 2019-01-11 MED ORDER — PREDNISONE 20 MG PO TABS: ORAL_TABLET | ORAL | 0 refills | Status: DC

## 2019-01-11 NOTE — Telephone Encounter (Signed)
Patient's daughter phoned to report her mother is short of breath more than her usual with COPD. Yesterday and today. Daughter placed 02@2L  via [place] on patient which has eased her SOB. Her breathing appears normal and unlabored with the O2. Does not have a Pox but will obtain one today. [facility] nurse saw her yesterday and reported mild wheezing. Nurse also reported patient's 02 level as 94% on RA, yesterday. Daughter can not hear wheezing today. Denies fever/CP and all other symptoms. Reports patient recently completed Keflex 500 MG abx from a hospital encounter on 12/29/18. Daughter is performed nebulizer tx's 2-3x yesterday. None today so far. Mother drinking approximately 32 oz daily and daughter is pushing more.  She is requesting an antibiotic and steroid treatment if possible without having to bring in 89 yr to the office. [facility] on file.  Routing to PCP for advice.  Reason for Disposition . [1] MODERATE longstanding difficulty breathing (e.g., speaks in phrases, SOB even at rest, pulse 100-120) AND [2] SAME as normal  Answer Assessment - Initial Assessment Questions 1. RESPIRATORY STATUS: "Describe your breathing?" (e.g., wheezing, shortness of breath, unable to speak, severe coughing)      With exertion, she gets very short of breath. 2. ONSET: "When did this breathing problem begin?"      3 days ago. 3. PATTERN "Does the difficult breathing come and go, or has it been constant since it started?"       4. SEVERITY: "How bad is your breathing?" (e.g., mild, moderate, severe)    - MILD: No SOB at rest, mild SOB with walking, speaks normally in sentences, can lay down, no retractions, pulse < 100.    - MODERATE: SOB at rest, SOB with minimal exertion and prefers to sit, cannot lie down flat, speaks in phrases, mild retractions, audible wheezing, pulse 100-120.    - SEVERE: Very SOB at rest, speaks in single words, struggling to breathe, sitting hunched forward, retractions, pulse > 120      02  at 2L via [place]-started using constantly starting a bedtime last night. 5. RECURRENT SYMPTOM: "Have you had difficulty breathing before?" If so, ask: "When was the last time?" and "What happened that time?"      COPD. Coughing up clear phlegm 6. CARDIAC HISTORY: "Do you have any history of heart disease?" (e.g., heart attack, angina, bypass surgery, angioplasty)     Stent placement last year. Needs aortic valve replaced per daughter.  7. LUNG HISTORY: "Do you have any history of lung disease?"  (e.g., pulmonary embolus, asthma, emphysema)     COPD 8. CAUSE: "What do you think is causing the breathing problem?"      unsure 9. OTHER SYMPTOMS: "Do you have any other symptoms? (e.g., dizziness, runny nose, cough, chest pain, fever)    Wheezing. 10. PREGNANCY: "Is there any chance you are pregnant?" "When was your last menstrual period?"       no 11. TRAVEL: "Have you traveled out of the country in the last month?" (e.g., travel history, exposures)       no  Protocols used: BREATHING DIFFICULTY-A-AH

## 2019-01-11 NOTE — Telephone Encounter (Signed)
Message sent to Dr. Jordan for review. 

## 2019-01-11 NOTE — Telephone Encounter (Signed)
Patient's daughter given recommendations per Dr. [name] and verbalized understanding. Rx sent to pharmacy. Referral placed for GI as requested.

## 2019-01-11 NOTE — Telephone Encounter (Signed)
She has received antibiotics  Rx's on 12/18/2018 and 12/29/2018. I do not feel comfortable prescribing more abx, there is also about possible side effects from antibiotics.  A short course of prednisone is an option to treat wheezing and COPD exacerbation.  She could try prednisone 40 mg for 3 to 5 days, if still wheezing. Albuterol neb 3 times daily for 5 days then prn. We could arrange CXR , at [facility] or here to evaluate for pneumonia.  Continue supplemental O2 to keep O2 sats above 89%.  If she is in respiratory distress, she may need to be evaluated in the ER. If symptoms are persistent I may need to see her.  Thanks, BJ

## 2019-01-12 ENCOUNTER — Telehealth: Payer: Self-pay | Admitting: Family Medicine

## 2019-01-12 DIAGNOSIS — I5033 Acute on chronic diastolic (congestive) heart failure: Secondary | ICD-10-CM | POA: Diagnosis not present

## 2019-01-12 DIAGNOSIS — J690 Pneumonitis due to inhalation of food and vomit: Secondary | ICD-10-CM | POA: Diagnosis not present

## 2019-01-12 DIAGNOSIS — J441 Chronic obstructive pulmonary disease with (acute) exacerbation: Secondary | ICD-10-CM | POA: Diagnosis not present

## 2019-01-12 DIAGNOSIS — R1310 Dysphagia, unspecified: Secondary | ICD-10-CM | POA: Diagnosis not present

## 2019-01-12 DIAGNOSIS — I2510 Atherosclerotic heart disease of native coronary artery without angina pectoris: Secondary | ICD-10-CM | POA: Diagnosis not present

## 2019-01-12 DIAGNOSIS — J9621 Acute and chronic respiratory failure with hypoxia: Secondary | ICD-10-CM | POA: Diagnosis not present

## 2019-01-12 NOTE — Telephone Encounter (Unsigned)
Copied from [place] [id number]. Topic: Quick Communication - Home Health Verbal Orders >> Jan 12, 2019  1:32 PM Carolyn Stare wrote: Jessica [name] with [facility]     bp reading 90/44    Callback Number   [place] case manager     Requesting  verbal orders for a full chest xray     mobile

## 2019-01-12 NOTE — Telephone Encounter (Signed)
Recommend increasing fluid intake by 8 oz, we also need to be carefull with drinking too much fluids because CHF. Verbal order for CXR can be given. Can labs be done through [facility]? BMP and CBC.  Continue monitoring BP.  Thanks, BJ

## 2019-01-12 NOTE — Telephone Encounter (Signed)
Verbal orders given to Lauren as requested.

## 2019-01-13 DIAGNOSIS — J811 Chronic pulmonary edema: Secondary | ICD-10-CM | POA: Diagnosis not present

## 2019-01-13 NOTE — Telephone Encounter (Signed)
LMTCB for Lauren  

## 2019-01-17 DIAGNOSIS — I5033 Acute on chronic diastolic (congestive) heart failure: Secondary | ICD-10-CM | POA: Diagnosis not present

## 2019-01-17 DIAGNOSIS — J9621 Acute and chronic respiratory failure with hypoxia: Secondary | ICD-10-CM | POA: Diagnosis not present

## 2019-01-17 DIAGNOSIS — I2510 Atherosclerotic heart disease of native coronary artery without angina pectoris: Secondary | ICD-10-CM | POA: Diagnosis not present

## 2019-01-17 DIAGNOSIS — R1310 Dysphagia, unspecified: Secondary | ICD-10-CM | POA: Diagnosis not present

## 2019-01-17 DIAGNOSIS — J441 Chronic obstructive pulmonary disease with (acute) exacerbation: Secondary | ICD-10-CM | POA: Diagnosis not present

## 2019-01-17 DIAGNOSIS — J690 Pneumonitis due to inhalation of food and vomit: Secondary | ICD-10-CM | POA: Diagnosis not present

## 2019-01-19 DIAGNOSIS — I5033 Acute on chronic diastolic (congestive) heart failure: Secondary | ICD-10-CM | POA: Diagnosis not present

## 2019-01-19 DIAGNOSIS — J441 Chronic obstructive pulmonary disease with (acute) exacerbation: Secondary | ICD-10-CM | POA: Diagnosis not present

## 2019-01-19 DIAGNOSIS — R1310 Dysphagia, unspecified: Secondary | ICD-10-CM | POA: Diagnosis not present

## 2019-01-19 DIAGNOSIS — I2510 Atherosclerotic heart disease of native coronary artery without angina pectoris: Secondary | ICD-10-CM | POA: Diagnosis not present

## 2019-01-19 DIAGNOSIS — J9621 Acute and chronic respiratory failure with hypoxia: Secondary | ICD-10-CM | POA: Diagnosis not present

## 2019-01-19 DIAGNOSIS — J690 Pneumonitis due to inhalation of food and vomit: Secondary | ICD-10-CM | POA: Diagnosis not present

## 2019-01-20 ENCOUNTER — Telehealth: Payer: Self-pay

## 2019-01-20 DIAGNOSIS — I2510 Atherosclerotic heart disease of native coronary artery without angina pectoris: Secondary | ICD-10-CM | POA: Diagnosis not present

## 2019-01-20 DIAGNOSIS — J441 Chronic obstructive pulmonary disease with (acute) exacerbation: Secondary | ICD-10-CM | POA: Diagnosis not present

## 2019-01-20 DIAGNOSIS — I5033 Acute on chronic diastolic (congestive) heart failure: Secondary | ICD-10-CM | POA: Diagnosis not present

## 2019-01-20 DIAGNOSIS — Z7901 Long term (current) use of anticoagulants: Secondary | ICD-10-CM | POA: Diagnosis not present

## 2019-01-20 DIAGNOSIS — J690 Pneumonitis due to inhalation of food and vomit: Secondary | ICD-10-CM | POA: Diagnosis not present

## 2019-01-20 DIAGNOSIS — Z86718 Personal history of other venous thrombosis and embolism: Secondary | ICD-10-CM | POA: Diagnosis not present

## 2019-01-20 DIAGNOSIS — Z7902 Long term (current) use of antithrombotics/antiplatelets: Secondary | ICD-10-CM | POA: Diagnosis not present

## 2019-01-20 DIAGNOSIS — R1310 Dysphagia, unspecified: Secondary | ICD-10-CM | POA: Diagnosis not present

## 2019-01-20 DIAGNOSIS — N183 Chronic kidney disease, stage 3 (moderate): Secondary | ICD-10-CM | POA: Diagnosis not present

## 2019-01-20 DIAGNOSIS — J9621 Acute and chronic respiratory failure with hypoxia: Secondary | ICD-10-CM | POA: Diagnosis not present

## 2019-01-20 NOTE — Telephone Encounter (Signed)
Copied from [place] [id number]. Topic: Quick Communication - Home Health Verbal Orders >> Jan 20, 2019  2:36 PM Percell Belt A wrote: Caller/Agency: [facility]- Left VM in [place] Number: [phone] Requesting OT/PT/Skilled Nursing/Social Work/Speech Therapy:  she wanted to give chest xray results. It does show that pt may have pneumonia, she did hear wheezing as well

## 2019-01-21 ENCOUNTER — Other Ambulatory Visit: Payer: Self-pay | Admitting: Family Medicine

## 2019-01-21 MED ORDER — DOXYCYCLINE HYCLATE 100 MG PO TABS: 100.0000 mg | ORAL_TABLET | Freq: Two times a day (BID) | ORAL | 0 refills | Status: AC

## 2019-01-21 MED ORDER — PREDNISONE 20 MG PO TABS: ORAL_TABLET | ORAL | 0 refills | Status: AC

## 2019-01-21 NOTE — Telephone Encounter (Signed)
Spoke with patient's daughter, gave recommendations per Dr. [name]. Patient's daughter verbalized understanding and stated that she would call office after completion of antibiotic to schedule OV with Dr. [name].

## 2019-01-21 NOTE — Telephone Encounter (Signed)
Please start Doxycycline 100 mg bid for 7 days. Rx sent. Albuterol inh 2 puff every 6 hours for a week then as needed for wheezing or shortness of breath.  No changes in Pulmicort. Prednisone taper, she has been on Prednisone on/off for the past couple months.  We need f/u appt with her pulmonologist or here (7 am appt).  Thanks, BJ

## 2019-01-21 NOTE — Telephone Encounter (Signed)
Message sent to Dr. Jordan for review. 

## 2019-01-25 DIAGNOSIS — J9621 Acute and chronic respiratory failure with hypoxia: Secondary | ICD-10-CM | POA: Diagnosis not present

## 2019-01-25 DIAGNOSIS — I2510 Atherosclerotic heart disease of native coronary artery without angina pectoris: Secondary | ICD-10-CM | POA: Diagnosis not present

## 2019-01-25 DIAGNOSIS — R1310 Dysphagia, unspecified: Secondary | ICD-10-CM | POA: Diagnosis not present

## 2019-01-25 DIAGNOSIS — J690 Pneumonitis due to inhalation of food and vomit: Secondary | ICD-10-CM | POA: Diagnosis not present

## 2019-01-25 DIAGNOSIS — I5033 Acute on chronic diastolic (congestive) heart failure: Secondary | ICD-10-CM | POA: Diagnosis not present

## 2019-01-25 DIAGNOSIS — J441 Chronic obstructive pulmonary disease with (acute) exacerbation: Secondary | ICD-10-CM | POA: Diagnosis not present

## 2019-01-31 DIAGNOSIS — J690 Pneumonitis due to inhalation of food and vomit: Secondary | ICD-10-CM | POA: Diagnosis not present

## 2019-01-31 DIAGNOSIS — J441 Chronic obstructive pulmonary disease with (acute) exacerbation: Secondary | ICD-10-CM | POA: Diagnosis not present

## 2019-01-31 DIAGNOSIS — R1310 Dysphagia, unspecified: Secondary | ICD-10-CM | POA: Diagnosis not present

## 2019-01-31 DIAGNOSIS — J9621 Acute and chronic respiratory failure with hypoxia: Secondary | ICD-10-CM | POA: Diagnosis not present

## 2019-01-31 DIAGNOSIS — I5033 Acute on chronic diastolic (congestive) heart failure: Secondary | ICD-10-CM | POA: Diagnosis not present

## 2019-01-31 DIAGNOSIS — I2510 Atherosclerotic heart disease of native coronary artery without angina pectoris: Secondary | ICD-10-CM | POA: Diagnosis not present

## 2019-02-01 ENCOUNTER — Other Ambulatory Visit: Payer: Self-pay | Admitting: Family Medicine

## 2019-02-01 DIAGNOSIS — F411 Generalized anxiety disorder: Secondary | ICD-10-CM

## 2019-02-01 DIAGNOSIS — G4700 Insomnia, unspecified: Secondary | ICD-10-CM

## 2019-02-01 NOTE — Telephone Encounter (Signed)
Left message to schedule follow up

## 2019-02-02 DIAGNOSIS — R1310 Dysphagia, unspecified: Secondary | ICD-10-CM | POA: Diagnosis not present

## 2019-02-02 DIAGNOSIS — I2510 Atherosclerotic heart disease of native coronary artery without angina pectoris: Secondary | ICD-10-CM | POA: Diagnosis not present

## 2019-02-02 DIAGNOSIS — I5033 Acute on chronic diastolic (congestive) heart failure: Secondary | ICD-10-CM | POA: Diagnosis not present

## 2019-02-02 DIAGNOSIS — J9621 Acute and chronic respiratory failure with hypoxia: Secondary | ICD-10-CM | POA: Diagnosis not present

## 2019-02-02 DIAGNOSIS — J690 Pneumonitis due to inhalation of food and vomit: Secondary | ICD-10-CM | POA: Diagnosis not present

## 2019-02-02 DIAGNOSIS — J441 Chronic obstructive pulmonary disease with (acute) exacerbation: Secondary | ICD-10-CM | POA: Diagnosis not present

## 2019-02-03 ENCOUNTER — Telehealth: Payer: Self-pay

## 2019-02-03 NOTE — Telephone Encounter (Signed)
Virtual Visit Pre-Appointment Phone Call  Steps For Call:  1. Confirm consent - "In the setting of the current Covid19 crisis, you are scheduled for a (phone or video) visit with your provider on (date) at (time).  Just as we do with many in-office visits, in order for you to participate in this visit, we must obtain consent.  If you'd like, I can send this to your mychart (if signed up) or email for you to review.  Otherwise, I can obtain your verbal consent now.  All virtual visits are billed to your insurance company just like a normal visit would be.  By agreeing to a virtual visit, we'd like you to understand that the technology does not allow for your provider to perform an examination, and thus may limit your provider's ability to fully assess your condition.  Finally, though the technology is pretty good, we cannot assure that it will always work on either your or our end, and in the setting of a video visit, we may have to convert it to a phone-only visit.  In either situation, we cannot ensure that we have a secure connection.  Are you willing to proceed?"  2. Give patient instructions for WebEx download to smartphone as below if video visit  3. Advise patient to be prepared with any vital sign or heart rhythm information, their current medicines, and a piece of paper and pen handy for any instructions they may receive the day of their visit  4. Inform patient they will receive a phone call 15 minutes prior to their appointment time (may be from unknown caller ID) so they should be prepared to answer  5. Confirm that appointment type is correct in Epic appointment notes (video vs telephone)    TELEPHONE CALL NOTE  Jessica [name] has been deemed a candidate for a follow-up tele-health visit to limit community exposure during the Covid-19 pandemic. I spoke with the patient via phone to ensure availability of phone/video source, confirm preferred email & phone number, and discuss  instructions and expectations.  I reminded Jessica [name] to be prepared with any vital sign and/or heart rhythm information that could potentially be obtained via home monitoring, at the time of her visit. I reminded Jessica [name] to expect a phone call at the time of her visit if her visit.  Did the patient verbally acknowledge consent to treatment? Yes  Tura Roller, [place] 02/03/2019 1:43 PM   DOWNLOADING THE Delia  - If Apple, go to [employer] and type in WebEx in the search bar. [place] [employer], the blue/green circle. The app is free but as with any other app downloads, their phone may require them to verify saved payment information or Apple password. The patient does NOT have to create an account.  - If Android, ask patient to go to [employer] and type in WebEx in the search bar. [place] [employer], the blue/green circle. The app is free but as with any other app downloads, their phone may require them to verify saved payment information or Android password. The patient does NOT have to create an account.   CONSENT FOR TELE-HEALTH VISIT - PLEASE REVIEW  I hereby voluntarily request, consent and authorize [facility] and its employed or contracted physicians, physician assistants, nurse practitioners or other licensed health care professionals (the Practitioner), to provide me with telemedicine health care services (the "Services") as deemed necessary by the treating Practitioner.  I acknowledge and consent to receive the Services by the Practitioner via telemedicine. I understand that the telemedicine visit will involve communicating with the Practitioner through live audiovisual communication technology and the disclosure of certain medical information by electronic transmission. I acknowledge that I have been given the opportunity to request an in-person assessment or other available alternative prior to the telemedicine visit and  am voluntarily participating in the telemedicine visit.  I understand that I have the right to withhold or withdraw my consent to the use of telemedicine in the course of my care at any time, without affecting my right to future care or treatment, and that the Practitioner or I may terminate the telemedicine visit at any time. I understand that I have the right to inspect all information obtained and/or recorded in the course of the telemedicine visit and may receive copies of available information for a reasonable fee.  I understand that some of the potential risks of receiving the Services via telemedicine include:  Marland Kitchen Delay or interruption in medical evaluation due to technological equipment failure or disruption; . Information transmitted may not be sufficient (e.g. poor resolution of images) to allow for appropriate medical decision making by the Practitioner; and/or  . In rare instances, security protocols could fail, causing a breach of personal health information.  Furthermore, I acknowledge that it is my responsibility to provide information about my medical history, conditions and care that is complete and accurate to the best of my ability. I acknowledge that Practitioner's advice, recommendations, and/or decision may be based on factors not within their control, such as incomplete or inaccurate data provided by me or distortions of diagnostic images or specimens that may result from electronic transmissions. I understand that the practice of medicine is not an exact science and that Practitioner makes no warranties or guarantees regarding treatment outcomes. I acknowledge that I will receive a copy of this consent concurrently upon execution via email to the email address I last provided but may also request a printed copy by calling the office of [place].    I understand that my insurance will be billed for this visit.   I have read or had this consent read to me. . I understand the  contents of this consent, which adequately explains the benefits and risks of the Services being provided via telemedicine.  . I have been provided ample opportunity to ask questions regarding this consent and the Services and have had my questions answered to my satisfaction. . I give my informed consent for the services to be provided through the use of telemedicine in my medical care  By participating in this telemedicine visit I agree to the above.

## 2019-02-07 DIAGNOSIS — J690 Pneumonitis due to inhalation of food and vomit: Secondary | ICD-10-CM | POA: Diagnosis not present

## 2019-02-07 DIAGNOSIS — R1310 Dysphagia, unspecified: Secondary | ICD-10-CM | POA: Diagnosis not present

## 2019-02-07 DIAGNOSIS — J441 Chronic obstructive pulmonary disease with (acute) exacerbation: Secondary | ICD-10-CM | POA: Diagnosis not present

## 2019-02-07 DIAGNOSIS — I2510 Atherosclerotic heart disease of native coronary artery without angina pectoris: Secondary | ICD-10-CM | POA: Diagnosis not present

## 2019-02-07 DIAGNOSIS — I5033 Acute on chronic diastolic (congestive) heart failure: Secondary | ICD-10-CM | POA: Diagnosis not present

## 2019-02-07 DIAGNOSIS — J9621 Acute and chronic respiratory failure with hypoxia: Secondary | ICD-10-CM | POA: Diagnosis not present

## 2019-02-08 ENCOUNTER — Other Ambulatory Visit: Payer: Self-pay

## 2019-02-08 NOTE — Patient Outreach (Addendum)
[facility]) Care Management  02/08/2019  Jessica [name] 85 year old female Jun 25, 1926 [id number]  Received notice from [facility] concerning referral from [place] to follow up with member for [facility].  Member with 2 hospital admissions in last 6 months and last visit from 12-14-2018 to 12-18-2018 due to COPD exacerbation. PMH: CAD; PE; COPD; Chronic bronchitis; CHF; CKD stage 3; Insomnia; Generalized anxiety disorder; Depression.  Called member at preferred number and member's daughter Jessica [name] answered phone. Jessica [name] (caregiver and daughter on consent) verified 2 patient identifiers. Introduced self and provided explanation of member's benefits relating to [employer]. Explained also there is no cost for [facility] services. RN CM asked to speak to member; however, Jessica [name] stated "no" and explained that "she can't hear". Jessica [name] denied need for [facility] Service at this time and declined.   Jessica [name] made aware that [place] is a benefit of [employer] and is available if needed. Jessica [name] stated she is currently driving and unable to write RN CM contact number down at present time.  Plan: Will send business card and [place] to member to include Nurse On Line magnet should services be needed at later time. Will not open case at this time.  Jessica Mola "ANN" Josiah Lobo, RN-BSN  [facility] Care Management  Community Care Management Coordinator  [phone] [email or web address]

## 2019-02-09 DIAGNOSIS — J690 Pneumonitis due to inhalation of food and vomit: Secondary | ICD-10-CM | POA: Diagnosis not present

## 2019-02-09 DIAGNOSIS — I5033 Acute on chronic diastolic (congestive) heart failure: Secondary | ICD-10-CM | POA: Diagnosis not present

## 2019-02-09 DIAGNOSIS — I2510 Atherosclerotic heart disease of native coronary artery without angina pectoris: Secondary | ICD-10-CM | POA: Diagnosis not present

## 2019-02-09 DIAGNOSIS — J9621 Acute and chronic respiratory failure with hypoxia: Secondary | ICD-10-CM | POA: Diagnosis not present

## 2019-02-09 DIAGNOSIS — R1310 Dysphagia, unspecified: Secondary | ICD-10-CM | POA: Diagnosis not present

## 2019-02-09 DIAGNOSIS — J441 Chronic obstructive pulmonary disease with (acute) exacerbation: Secondary | ICD-10-CM | POA: Diagnosis not present

## 2019-02-10 ENCOUNTER — Other Ambulatory Visit (HOSPITAL_COMMUNITY): Payer: Self-pay

## 2019-02-10 ENCOUNTER — Other Ambulatory Visit: Payer: Self-pay | Admitting: Physician Assistant

## 2019-02-10 ENCOUNTER — Telehealth (INDEPENDENT_AMBULATORY_CARE_PROVIDER_SITE_OTHER): Payer: Medicare Other | Admitting: Physician Assistant

## 2019-02-10 ENCOUNTER — Other Ambulatory Visit: Payer: Self-pay

## 2019-02-10 VITALS — BP 140/72

## 2019-02-10 DIAGNOSIS — R918 Other nonspecific abnormal finding of lung field: Secondary | ICD-10-CM

## 2019-02-10 DIAGNOSIS — I350 Nonrheumatic aortic (valve) stenosis: Secondary | ICD-10-CM | POA: Diagnosis not present

## 2019-02-10 DIAGNOSIS — Z7189 Other specified counseling: Secondary | ICD-10-CM

## 2019-02-10 DIAGNOSIS — I2699 Other pulmonary embolism without acute cor pulmonale: Secondary | ICD-10-CM

## 2019-02-10 DIAGNOSIS — I2510 Atherosclerotic heart disease of native coronary artery without angina pectoris: Secondary | ICD-10-CM

## 2019-02-10 DIAGNOSIS — R911 Solitary pulmonary nodule: Secondary | ICD-10-CM

## 2019-02-10 NOTE — Patient Instructions (Signed)
Hello [name] and Ms. Geis,   It was so nice to talk to you on the phone today. I am so glad you are doing so well. I just wanted to send you a recap of our discussion.   You can stop plavix at this time. Please continue on all your other medications including Eliquis.  I will defer if/when you can stop Eliquis to your PCP, Dr. [name].   Your echo has been rescheduled to June. I have also ordered a CT scan to follow up on some pulmonary nodules to be done the same day. There is a spot on your pancreas that may require further imaging. I will leave that decision to Dr. Loletha Carrow, who you have an appointment with on 4/20.   All appointment details are attached to this letter in your "after visit summary."  Please call us with any questions or concerns you may have and please stay safe during these uncertain times.  Nell Range

## 2019-02-10 NOTE — Progress Notes (Signed)
HEART AND VASCULAR CENTER   MULTIDISCIPLINARY HEART VALVE TEAM     Virtual Visit via Video Note   This visit type was conducted due to national recommendations for restrictions regarding the COVID-19 Pandemic (e.g. social distancing) in an effort to limit this patient's exposure and mitigate transmission in our community.  Due to her co-morbid illnesses, this patient is at least at moderate risk for complications without adequate follow up.  This format is felt to be most appropriate for this patient at this time.  All issues noted in this document were discussed and addressed.  A limited physical exam was performed with this format.  Please refer to the patient's chart for her consent to telehealth for [facility].   Evaluation Performed:  Follow-up visit  Date:  02/10/2019   ID:  Jessica [name], DOB 05-16-1926, MRN [id number]  Patient Location: Home Provider Location: Office  PCP:  [name], [name], MD  Cardiologist:  [name], MD  Electrophysiologist:  None   Chief Complaint:  Follow up of severe AS  History of Present Illness:    Jessica [name] is a 85 y.o. female with  COPD, CAD, HLD, severe aortic stenosis and recently diagnosed pulmonary embolism who presents to for a telehealth visit for follow up.   The patient does not have symptoms concerning for COVID-19 infection (fever, chills, cough, or new shortness of breath).   [name] severe aortic stenosis. During her pre op work upfor TAVR,she had a right and left heart cath on 07/07/18 which showed a severe, heavily calcified (80%) stenosis in the proximal LADthat required revascularization. She underwent coronary atherectomy with stent placement to mLAD on 07/23/18. Given previous allergy she was pre-medicated with steroids and benadryl. Despite pre medication she still had a delayed contrast reaction greater than 12 hours after PCI resulting in pruritus and generalized erythema and associated hypotensionw/  SBPinto the 90s. Due to her severe reaction is was decided to premedicate herbefore and after scans and keep inpatient. She did well with CT scans and had no reaction with pre/post regimen. Scan did show an incidental finding of an acute right lung PE. LE dopplers showed right leg DVT. She was started on Eliquis and continued on plavix given recent stent. Aspirin was discontinued. Plan was for 6 months of anticoagulation before possible TAVR.  She was seen in the office in 09/2018 and reported an improvement in her dyspnea with treatment of her PE but continued to have significant DOE.  She was admitted in 11/2018 for multifactorial-likely aspiration pneumonia triggering bronchospasm and COPD exacerbation and acute CHF.  She was seen in the ER on 12/29/18 for orthostasis and UTI.  Today she presents for a telehealth visit. Her daughter [name] is presents for the conversation. Her shortness of breath has improved a lot but still has some dyspnea on exertion. No CP. No LE edema, orthopnea or PND. No dizziness or syncope. No blood in stool or urine. No palpitations.     Past Medical History:  Diagnosis Date   Anxiety    congenital nystagmus    COPD (chronic obstructive pulmonary disease) (HCC)    Coronary artery disease    DJD (degenerative joint disease)    Fibromyalgia    Low back pain syndrome    Memory loss    Other and unspecified hyperlipidemia    Severe aortic stenosis    Venous insufficiency    Past Surgical History:  Procedure Laterality Date   ABDOMINAL HYSTERECTOMY     ANTERIOR  CERVICAL DISCECTOMY     CARPAL TUNNEL RELEASE  12/2011   right arm   CATARACT EXTRACTION     CORONARY ATHERECTOMY  07/23/2018   PTCA/orbital atherectomy/DES x 1 proximal to mid LAD   CORONARY ATHERECTOMY N/A 07/23/2018   Procedure: CORONARY ATHERECTOMY;  Surgeon: Burnell Blanks, MD;  Location: [place];  Service: Cardiovascular;  Laterality: N/A;   CORONARY STENT  INTERVENTION     CORONARY STENT INTERVENTION N/A 07/23/2018   Procedure: CORONARY STENT INTERVENTION;  Surgeon: Burnell Blanks, MD;  Location: [place] [facility];  Service: Cardiovascular;  Laterality: N/A;   LUMBAR LAMINECTOMY     right knee arthroscopy     right shoulder replacement  04/2009   right shoulder surgery  2008   Dr. Noemi Chapel   RIGHT/LEFT HEART CATH AND CORONARY ANGIOGRAPHY N/A 07/07/2018   Procedure: RIGHT/LEFT HEART CATH AND CORONARY ANGIOGRAPHY;  Surgeon: Burnell Blanks, MD;  Location: [place] [facility];  Service: Cardiovascular;  Laterality: N/A;   ULTRASOUND GUIDANCE FOR VASCULAR ACCESS  07/07/2018   Procedure: Ultrasound Guidance For Vascular Access;  Surgeon: Burnell Blanks, MD;  Location: [facility];  Service: Cardiovascular;;     Current Meds  Medication Sig   albuterol (PROVENTIL) (2.5 MG/3ML) 0.083% nebulizer solution Take 3 mLs (2.5 mg total) by nebulization every 4 (four) hours as needed for wheezing or shortness of breath. DX: COPD J44.9   ALPRAZolam (XANAX) 0.5 MG tablet TAKE ONE TABLET BY MOUTH AT BEDTIME AS NEEDED FOR ANXIETY   AMBULATORY NON FORMULARY MEDICATION Medication Name: Incentive spirometer Use 10-15 times per day   arformoterol (BROVANA) 15 MCG/2ML NEBU Take 2 mLs (15 mcg total) by nebulization 2 (two) times daily. DX: COPD J44.9 (Patient taking differently: Take 15 mcg by nebulization 2 (two) times daily. )   benzonatate (TESSALON) 200 MG capsule Take 1 capsule (200 mg total) by mouth 3 (three) times daily as needed for cough.   budesonide (PULMICORT) 0.5 MG/2ML nebulizer solution Take 2 mLs (0.5 mg total) by nebulization 2 (two) times daily. DX: COPD J44.9   Cholecalciferol (VITAMIN D) 1000 UNITS capsule Take 1,000 Units by mouth daily.     ELIQUIS 5 MG TABS tablet TAKE 1 TABLET BY MOUTH TWICE A DAY   escitalopram (LEXAPRO) 20 MG tablet Take 1 tablet (20 mg total) by mouth daily.   fluticasone  (FLONASE) 50 MCG/ACT nasal spray Place 2 sprays into both nostrils daily.   guaiFENesin (MUCINEX) 600 MG 12 hr tablet Take 1 tablet (600 mg total) by mouth 2 (two) times daily.   loratadine (CLARITIN) 10 MG tablet Take 1 tablet (10 mg total) by mouth daily.   metoprolol tartrate (LOPRESSOR) 25 MG tablet Take 0.5 tablets (12.5 mg total) by mouth 2 (two) times daily.   multivitamin-lutein (OCUVITE-LUTEIN) CAPS capsule Take 1 capsule by mouth daily.   pantoprazole (PROTONIX) 40 MG tablet Take 1 tablet (40 mg total) by mouth daily at 12 noon.   Respiratory Therapy Supplies (FLUTTER) DEVI 1 each by Does not apply route daily.   simvastatin (ZOCOR) 20 MG tablet Take 1 tablet (20 mg total) by mouth at bedtime.   traZODone (DESYREL) 50 MG tablet Take 0.5-1 tablets (25-50 mg total) by mouth at bedtime as needed for sleep.   triamcinolone cream (KENALOG) 0.1 % Apply 1 application topically 2 (two) times daily.   VENTOLIN HFA 108 (90 Base) MCG/ACT inhaler INHALE 2 PUFFS EVERY 6HRS AS NEEDED FOR WHEEZING OR SHORTNESS OF  BREATH. DO NOT USE IF USING NEBS. (Patient taking differently: Inhale 2 puffs into the lungs every 6 (six) hours as needed for wheezing. )   [DISCONTINUED] clopidogrel (PLAVIX) 75 MG tablet Take 1 tablet (75 mg total) by mouth daily.     Allergies:   Contrast media [iodinated diagnostic agents] and Iohexol   Social History   Tobacco Use   Smoking status: Former Smoker    Packs/day: 2.00    Years: 30.00    Pack years: 60.00    Types: Cigarettes    Last attempt to quit: 10/28/1991    Years since quitting: 27.3   Smokeless tobacco: Never Used  Substance Use Topics   Alcohol use: Yes    Comment: 1 drink per night   Drug use: No     Family Hx: The patient's family history includes Other in her father and mother.  ROS:   Please see the history of present illness.    All other systems reviewed and are negative.   Prior CV studies:   The following studies were  reviewed today:  Echo 06/10/18 Study Conclusions - Left ventricle: The cavity size was normal. There was moderate concentric hypertrophy. Systolic function was normal. The estimated ejection fraction was in the range of 55% to 60%. Wall motion was normal; there were no regional wall motion abnormalities. Doppler parameters are consistent with abnormal left ventricular relaxation (grade 1 diastolic dysfunction). - Aortic valve: There was severe stenosis. Mean gradient (S): 49 mm Hg. VTI ratio of LVOT to aortic valve: 0.15. Valve area (VTI): 0.47 cm^2. - Mitral valve: Calcified annulus. - Pulmonary arteries: Systolic pressure was mildly to moderately increased. PA peak pressure: 45 mm Hg (S). Impressions: - Progression of severe aortic stenosis.  _______________   Cardiac CT IMPRESSION: 1. Tri leaflet AV with annular area of 436 mm2 suitable for a 26 mm Sapien 3 valve  2. Normal aortic root 3.5 cm  3. Optimum angiographic angle for deployment LAO 14 Caudal 19 degrees  4. Coronary arteries sufficient height above annulus for deployment  5. No LAA thrombus  ___________  CTA chest abd pelvis IMPRESSION: 1. Incidental acute pulmonary emboli within the lobar and segmental branches of the right upper and right middle lobes. 2. Vascular findings and measurements pertinent to potential TAVR procedure, as detailed above. 3. Severe thickening and calcification of the aortic valve, compatible with the reported clinical history of severe aortic stenosis. 4. Aortic Atherosclerosis (ICD10-I70.0) and Emphysema (ICD10-J43.9). 5. Two-vessel coronary atherosclerosis. 6. Posterior apical right upper lobe 8 x 6 mm solid pulmonary nodule appears slightly increased in size since 2016 chest CT, potentially artifactual due to technical scan differences. Recommend attention on follow-up chest CT in 3-6 months. 7. Cystic 2.3 cm pancreatic head lesion is increased  in size since 2011 CT abdomen study. No biliary or pancreatic duct dilation or other high risk features on this routine CT study. MRI abdomen without and with IV contrast is indicated for further characterization, and may be performed as clinically warranted. Alternatively, follow-up pancreas protocol CT abdomen without and with IV contrast may be obtained in 6-12 months. 8. Marked sigmoid diverticulosis.   Labs/Other Tests and Data Reviewed:    EKG:  none  Recent Labs: 08/25/2018: ALT 11 12/14/2018: B Natriuretic Peptide 786.4 12/16/2018: Magnesium 1.9 12/29/2018: BUN 13; Creatinine, Ser 1.05; Hemoglobin 10.7; Platelets 242; Potassium 4.1; Sodium 136   Recent Lipid Panel Lab Results  Component Value Date/Time   CHOL 125 05/27/2017 10:52 AM  TRIG 52.0 05/27/2017 10:52 AM   HDL 54.70 05/27/2017 10:52 AM   CHOLHDL 2 05/27/2017 10:52 AM   LDLCALC 60 05/27/2017 10:52 AM   LDLDIRECT 150.0 06/12/2008 09:31 AM    Wt Readings from Last 3 Encounters:  12/29/18 140 lb (63.5 kg)  12/21/18 138 lb 6 oz (62.8 kg)  12/15/18 134 lb 6.4 oz (61 kg)     Objective:    Vital Signs:  BP 140/72    Well nourished, well developed female in no acute distress. Elderly white female. HOH but alert and oriented.    ASSESSMENT & PLAN:    Severe aortic stenosis: she continues to have dyspnea on exertion but appears to be stable at this time. Shortness of breath has improved a lot, Will plan to repeat echo in June and move forward with TAVR process if safe from a Covid 19 pandemic standpoint.   Pulmonary embolism: she had an unprovoked DVT/PE diagnosed in 07/2018. Plan to continue Eliquis at this time. WIll defer to her PCP on if/when she can stop anticoagulation.   CAD: s/p successful PTCA/orbital atherectomy/DES x 1 proximal to mid LAD on 07/23/18. She has been on plavix and Eliquis. Plan to stop Plavix at this time since it has been over 6 months since her PCI  Incidental findings: pre TAVR CT  scans in 07/2018 showed -- RUL and recommended follow-up chest CT in 3-6 months. Will get CT chest at the same time as her echo. -- There is a cystic 2.3 cm pancreatic head lesion is increased in size since 2011 CT abdomen study. No biliary or pancreatic duct dilation or other high risk features on this routine CT study. MRI abdomen without and with IV contrast is indicated for further characterization, and may be performed as clinically warranted. She has an upcoming appointment with Dr. Loletha Carrow with GI. I will defer need for further imaging to him   COVID-19 Education: the signs and symptoms of COVID-19 were discussed with the patient and how to seek care for testing (follow up with PCP or arrange E-visit).  The importance of social distancing was discussed today.  Time:   Today, I have spent 25 minutes with the patient with telehealth technology discussing the above problems.     Medication Adjustments/Labs and Tests Ordered: Current medicines are reviewed at length with the patient today.  Concerns regarding medicines are outlined above.   Tests Ordered: Chest CT WO contrast ECHO  Medication Changes: STOP PLAVIX  Disposition:  Follow up in 2 month(s)  Signed, [name], PA-C  02/10/2019 3:15 PM    [facility]

## 2019-02-14 ENCOUNTER — Other Ambulatory Visit: Payer: Self-pay

## 2019-02-14 ENCOUNTER — Encounter: Payer: Self-pay | Admitting: Gastroenterology

## 2019-02-14 ENCOUNTER — Ambulatory Visit (INDEPENDENT_AMBULATORY_CARE_PROVIDER_SITE_OTHER): Payer: Medicare Other | Admitting: Gastroenterology

## 2019-02-14 DIAGNOSIS — I2510 Atherosclerotic heart disease of native coronary artery without angina pectoris: Secondary | ICD-10-CM | POA: Diagnosis not present

## 2019-02-14 DIAGNOSIS — R1312 Dysphagia, oropharyngeal phase: Secondary | ICD-10-CM

## 2019-02-14 DIAGNOSIS — K862 Cyst of pancreas: Secondary | ICD-10-CM

## 2019-02-14 DIAGNOSIS — T17908A Unspecified foreign body in respiratory tract, part unspecified causing other injury, initial encounter: Secondary | ICD-10-CM

## 2019-02-14 NOTE — Progress Notes (Signed)
This patient contacted our office requesting a physician telemedicine video consultation regarding clinical questions and/or test results.  If new patient, they were referred by CT surgery [name], PA)  Participants on the Zoom : myself, daughter, granddaughter   The patient consented to phone consultation and was aware that a charge will be placed through their insurance.  I was in my office and the patient was at home   Encounter time:  Total time 30 minutes, with 21 minutes spent with patient on phone/webex  (extensive records to review)  Wilfrid Lund, MD   _____________________________________________________________________________________________              [facility] Consult Note:  History: Jessica [name] 02/14/2019  Referring physician: [name], [name], MD  Reason for consult/chief complaint: No chief complaint on file.   Subjective  HPI:   The patient was referred by the cardiovascular surgery service due to incidental findings of a pancreatic lesion on imaging.  She has multiple severe chronic medical problems and is on chronic oxygen supplementation.  She has severe aortic stenosis, has had cardiac cath placed as preop work-up for possible TAVR.  Recent cardiology note from 3/53/6144 outlines complicated course over the last 6 months.  Among other things, incidental acute pulmonary emboli were found with DVT, patient on oral anticoagulation at present.  She has not yet been able to have the valve replacement, has significant dyspnea on exertion.  She was admitted with pneumonia February with possible aspiration and bronchospasm, COPD exacerbation and acute CHF.  She was in the ED on March 4 for orthostasis and urinary infection.  Patient and family report that she no longer seems to have difficulty with aspiration years they can tell.    She does not cough when she eats or drinks, so they have stopped thickening her liquids.  It seems  that the modest improvement in respiratory status since the hospitalization has led to less global weakness and better swallowing.  Appetite is "fair", no nausea or vomiting.  She lost weight with hospitalization related illness but it has stabilized.  ROS:  Review of Systems Chronic dyspnea at rest and worse with exertion Denies chest pain No current dysuria  Past Medical History: Past Medical History:  Diagnosis Date  . Anxiety   . congenital nystagmus   . COPD (chronic obstructive pulmonary disease) ([place])   . Coronary artery disease   . DJD (degenerative joint disease)   . Fibromyalgia   . Low back pain syndrome   . Lung nodules   . Memory loss   . Other and unspecified hyperlipidemia   . Pancreatic lesion   . Severe aortic stenosis   . Venous insufficiency      Past Surgical History: Past Surgical History:  Procedure Laterality Date  . ABDOMINAL HYSTERECTOMY    . ANTERIOR CERVICAL DISCECTOMY    . CARPAL TUNNEL RELEASE  12/2011   right arm  . CATARACT EXTRACTION    . CORONARY ATHERECTOMY  07/23/2018   PTCA/orbital atherectomy/DES x 1 proximal to mid LAD  . CORONARY ATHERECTOMY N/A 07/23/2018   Procedure: CORONARY ATHERECTOMY;  Surgeon: Burnell Blanks, MD;  Location: [facility];  Service: Cardiovascular;  Laterality: N/A;  . CORONARY STENT INTERVENTION    . CORONARY STENT INTERVENTION N/A 07/23/2018   Procedure: CORONARY STENT INTERVENTION;  Surgeon: Burnell Blanks, MD;  Location: [facility];  Service: Cardiovascular;  Laterality: N/A;  . LUMBAR LAMINECTOMY    . right knee  arthroscopy    . right shoulder replacement  04/2009  . right shoulder surgery  2008   Dr. Noemi Chapel  . RIGHT/LEFT HEART CATH AND CORONARY ANGIOGRAPHY N/A 07/07/2018   Procedure: RIGHT/LEFT HEART CATH AND CORONARY ANGIOGRAPHY;  Surgeon: Burnell Blanks, MD;  Location: [facility];  Service: Cardiovascular;  Laterality: N/A;  . ULTRASOUND GUIDANCE FOR  VASCULAR ACCESS  07/07/2018   Procedure: Ultrasound Guidance For Vascular Access;  Surgeon: Burnell Blanks, MD;  Location: [facility];  Service: Cardiovascular;;     Family History: Family History  Problem Relation Age of Onset  . Other Mother        broken hip  . Other Father        kidney problems  . Colon cancer Neg Hx   . Stomach cancer Neg Hx   . Rectal cancer Neg Hx   . Pancreatic cancer Neg Hx     Social History: Social History   Socioeconomic History  . Marital status: Widowed    Spouse name: Not on file  . Number of children: 2  . Years of education: Not on file  . Highest education level: Not on file  Occupational History  . Occupation: [employer]  Social Needs  . Financial resource strain: Not on file  . Food insecurity:    Worry: Not on file    Inability: Not on file  . Transportation needs:    Medical: Not on file    Non-medical: Not on file  Tobacco Use  . Smoking status: Former Smoker    Packs/day: 2.00    Years: 30.00    Pack years: 60.00    Types: Cigarettes    Last attempt to quit: 10/28/1991    Years since quitting: 27.3  . Smokeless tobacco: Never Used  Substance and Sexual Activity  . Alcohol use: Yes    Comment: 1 drink per night  . Drug use: No  . Sexual activity: Not Currently  Lifestyle  . Physical activity:    Days per week: Not on file    Minutes per session: Not on file  . Stress: Not on file  Relationships  . Social connections:    Talks on phone: Not on file    Gets together: Not on file    Attends religious service: Not on file    Active member of club or organization: Not on file    Attends meetings of clubs or organizations: Not on file    Relationship status: Not on file  Other Topics Concern  . Not on file  Social History Narrative   1 sibling alive age 84   1 sibling alive age 33   1 sibling deceased age 58   1 sibling alive age 38    Allergies: Allergies  Allergen  Reactions  . Contrast Media [Iodinated Diagnostic Agents] Itching, Rash and Other (See Comments)    Hypotension, Skin turns red like a sunburn  . Iohexol Itching, Rash and Other (See Comments)    Hypotension, Skin turns red like a sunburn    Outpatient Meds: Current Outpatient Medications  Medication Sig Dispense Refill  . albuterol (PROVENTIL) (2.5 MG/3ML) 0.083% nebulizer solution Take 3 mLs (2.5 mg total) by nebulization every 4 (four) hours as needed for wheezing or shortness of breath. DX: COPD J44.9 75 mL 12  . ALPRAZolam (XANAX) 0.5 MG tablet TAKE ONE TABLET BY MOUTH AT BEDTIME AS NEEDED FOR ANXIETY 30 tablet 2  . AMBULATORY NON  FORMULARY MEDICATION Medication Name: Incentive spirometer Use 10-15 times per day 1 each 0  . arformoterol (BROVANA) 15 MCG/2ML NEBU Take 2 mLs (15 mcg total) by nebulization 2 (two) times daily. DX: COPD J44.9 (Patient taking differently: Take 15 mcg by nebulization 2 (two) times daily. ) 120 mL 12  . benzonatate (TESSALON) 200 MG capsule Take 1 capsule (200 mg total) by mouth 3 (three) times daily as needed for cough. 20 capsule 0  . budesonide (PULMICORT) 0.5 MG/2ML nebulizer solution Take 2 mLs (0.5 mg total) by nebulization 2 (two) times daily. DX: COPD J44.9 120 mL 12  . Cholecalciferol (VITAMIN D) 1000 UNITS capsule Take 1,000 Units by mouth daily.      Marland Kitchen ELIQUIS 5 MG TABS tablet TAKE 1 TABLET BY MOUTH TWICE A DAY 60 tablet 5  . escitalopram (LEXAPRO) 20 MG tablet Take 1 tablet (20 mg total) by mouth daily. 90 tablet 1  . fluticasone (FLONASE) 50 MCG/ACT nasal spray Place 2 sprays into both nostrils daily. 16 g 0  . guaiFENesin (MUCINEX) 600 MG 12 hr tablet Take 1 tablet (600 mg total) by mouth 2 (two) times daily. 14 tablet 0  . loratadine (CLARITIN) 10 MG tablet Take 1 tablet (10 mg total) by mouth daily. 10 tablet 0  . metoprolol tartrate (LOPRESSOR) 25 MG tablet Take 0.5 tablets (12.5 mg total) by mouth 2 (two) times daily. 60 tablet 0  .  multivitamin-lutein (OCUVITE-LUTEIN) CAPS capsule Take 1 capsule by mouth daily.    . pantoprazole (PROTONIX) 40 MG tablet Take 1 tablet (40 mg total) by mouth daily at 12 noon. 30 tablet 0  . Respiratory Therapy Supplies (FLUTTER) DEVI 1 each by Does not apply route daily. 1 each 0  . simvastatin (ZOCOR) 20 MG tablet Take 1 tablet (20 mg total) by mouth at bedtime. 90 tablet 3  . traZODone (DESYREL) 50 MG tablet Take 0.5-1 tablets (25-50 mg total) by mouth at bedtime as needed for sleep. 90 tablet 0  . triamcinolone cream (KENALOG) 0.1 % Apply 1 application topically 2 (two) times daily. 45 g 0  . VENTOLIN HFA 108 (90 Base) MCG/ACT inhaler INHALE 2 PUFFS EVERY 6HRS AS NEEDED FOR WHEEZING OR SHORTNESS OF BREATH. DO NOT USE IF USING NEBS. (Patient taking differently: Inhale 2 puffs into the lungs every 6 (six) hours as needed for wheezing. ) 18 Inhaler 2   No current facility-administered medications for this visit.       ___________________________________________________________________ Objective   No exam -virtual visit  Labs: CBC Latest Ref Rng & Units 12/29/2018 12/21/2018 12/14/2018  WBC 4.0 - 10.5 K/uL 8.4 6.5 5.4  Hemoglobin 12.0 - 15.0 g/dL 10.7(L) 11.3(L) 10.2(L)  Hematocrit 36.0 - 46.0 % 36.0 34.6(L) 34.9(L)  Platelets 150 - 400 K/uL 242 262.0 241   CMP Latest Ref Rng & Units 12/29/2018 12/21/2018 12/17/2018  Glucose 70 - 99 mg/dL 104(H) 128(H) 138(H)  BUN 8 - 23 mg/dL 13 20 24(H)  Creatinine 0.44 - 1.00 mg/dL 1.05(H) 1.09 0.98  Sodium 135 - 145 mmol/L 136 141 140  Potassium 3.5 - 5.1 mmol/L 4.1 4.1 4.4  Chloride 98 - 111 mmol/L 105 103 101  CO2 22 - 32 mmol/L 23 27 34(H)  Calcium 8.9 - 10.3 mg/dL 9.0 9.3 9.4  Total Protein 6.5 - 8.1 g/dL - - -  Total Bilirubin 0.3 - 1.2 mg/dL - - -  Alkaline Phos 38 - 126 U/L - - -  AST 15 - 41 U/L - - -  ALT 0 - 44 U/L - - -     Radiologic Studies:  Partial report of CT angiogram from 08/24/2018;  "CTA ABDOMEN AND PELVIS FINDINGS    Hepatobiliary: Normal liver with no liver mass. Normal gallbladder with no radiopaque cholelithiasis. No intrahepatic biliary ductal dilatation. CBD diameter 6-7 mm, within normal limits for age.   Pancreas: Cystic 2.3 x 1.5 cm pancreatic head lesion (series 4/image 116), increased from 1.2 x 0.7 cm on 06/12/2010 CT. No additional pancreatic lesions. No pancreatic duct dilation.   Spleen: Normal size. No mass.   Adrenals/Urinary Tract: Normal adrenals. Simple 1.8 cm interpolar left renal cyst. Subcentimeter hypodense renal cortical lesion in the lower left kidney is too small to characterize and requires no follow-up. Multiple parapelvic renal cysts in both kidneys. No hydronephrosis. No additional contour deforming renal lesions. Normal bladder.   Stomach/Bowel: Normal non-distended stomach. Normal caliber small bowel with no small bowel wall thickening. Appendix not discretely visualized. No pericecal inflammatory changes. Marked sigmoid diverticulosis, with no large bowel wall thickening or significant pericolonic fat stranding.   Vascular/Lymphatic: Atherosclerotic nonaneurysmal abdominal aorta. Patent renal and splenic veins. No pathologically enlarged lymph nodes in the abdomen or pelvis.   Reproductive: Status post hysterectomy, with no abnormal findings at the vaginal cuff. No adnexal mass.   Other: No pneumoperitoneum, ascites or focal fluid collection.   Musculoskeletal: No aggressive appearing focal osseous lesions. Marked lumbar spondylosis."     ___________________________________________________   MBS 12/16/18 , reportedly with aspiration.  Has cervical hardware , ? Impingement in UES  (personally reviewed images)  Assessment: Encounter Diagnoses  Name Primary?  Marland Kitchen Oropharyngeal dysphagia Yes  . Aspiration into airway, initial encounter   . Pancreatic cyst     Dysphagia with intermittent aspiration when she has decompensation complex illnesses.  If she  has coughing with food or liquids, family should resume thickening her liquids.  She should always use a chin tuck maneuver with swallowing, and I demonstrated this to them.  Incidental pancreatic lesion with modest growth over many years.  This favors it being benign, I think it needs no further management at this point, especially considering the patient's age and condition.  I would not advocate radiographic follow-up for the same reasons.    Thank you for the courtesy of this consult.  Please call me with any questions or concerns.  Nelida Meuse III  CC: Referring provider noted above

## 2019-03-08 ENCOUNTER — Telehealth: Payer: Self-pay

## 2019-03-08 NOTE — Telephone Encounter (Signed)
patient will need 6WMT

## 2019-03-08 NOTE — Telephone Encounter (Signed)
Daughter called, and stated they are planning on going to the beach the end of the month. Was calling asking about portable O2 tanks. Patient is not currently on O2 during the day, just at night.  Daughter states that sometimes patient's O2 drops to 83%, but then she gives her a neb treatment and it comes up to 95-96%.  Wanted to check on making an apt to be tested for O2 need during the day.   DK please advise.

## 2019-03-10 NOTE — Telephone Encounter (Signed)
Called patient for COVID-19 pre-screening for in office visit.  Have you recently traveled any where out of the local area in the last 2 weeks? no  Have you been in close contact with a person diagnosed with COVID-19 within the last 2 weeks? no  Do you currently have any of the following symptoms? If so, when did they start? Cough     Diarrhea   Joint Pain Fever      Muscle Pain   Red eyes Shortness of breath   Abdominal pain  Vomiting Loss of smell    Rash    Sore Throat Headache    Weakness   Bruising or bleeding   Okay to proceed with visit. (03/14/19)

## 2019-03-11 ENCOUNTER — Telehealth: Payer: Self-pay | Admitting: Internal Medicine

## 2019-03-11 NOTE — Telephone Encounter (Signed)
Left message for pt's daughter, [name](DPR) to request that SMW for 03/14/2019 be reschedule.  There will not be a doctor in the office this day.

## 2019-03-14 ENCOUNTER — Ambulatory Visit: Payer: Medicare Other

## 2019-03-14 NOTE — Telephone Encounter (Signed)
Spoke to patient's daughter, we r/s apt for 03/16/19 @ 3:00. Nothing further at this time.

## 2019-03-15 ENCOUNTER — Telehealth (HOSPITAL_COMMUNITY): Payer: Self-pay | Admitting: *Deleted

## 2019-03-15 ENCOUNTER — Telehealth: Payer: Self-pay | Admitting: *Deleted

## 2019-03-15 NOTE — Telephone Encounter (Signed)

## 2019-03-15 NOTE — Telephone Encounter (Signed)
COVID-19 Pre-Screening Questions:  . Do you currently have a fever?NO (yes = cancel and refer to pcp for e-visit) . Have you recently travelled on a cruise, internationally, or to [place], [place], [place], [place], [place], or [place], [place] [employer]) ? NO (yes = cancel, stay home, monitor symptoms, and contact pcp or initiate e-visit if symptoms develop) . Have you been in contact with someone that is currently pending confirmation of Covid19 testing or has been confirmed to have the [place] virus?NO (yes = cancel, stay home, away from tested individual, monitor symptoms, and contact pcp or initiate e-visit if symptoms develop) . Are you currently experiencing fatigue or cough?NO (yes = pt should be prepared to have a mask placed at the time of their visit). . Reiterated no additional visitors. Eartha Inch no earlier than 15 minutes before appointment time. . Please bring own mask.  Talked to daughter Becky Sax which she is own [name]

## 2019-03-16 ENCOUNTER — Ambulatory Visit: Payer: Medicare Other

## 2019-03-16 ENCOUNTER — Other Ambulatory Visit: Payer: Self-pay

## 2019-03-16 ENCOUNTER — Ambulatory Visit (INDEPENDENT_AMBULATORY_CARE_PROVIDER_SITE_OTHER)
Admission: RE | Admit: 2019-03-16 | Discharge: 2019-03-16 | Disposition: A | Discharge status: Home / Self Care | Payer: Medicare Other | Source: Ambulatory Visit | Attending: Physician Assistant | Admitting: Physician Assistant

## 2019-03-16 DIAGNOSIS — R918 Other nonspecific abnormal finding of lung field: Secondary | ICD-10-CM | POA: Diagnosis not present

## 2019-03-17 ENCOUNTER — Telehealth: Payer: Self-pay | Admitting: Internal Medicine

## 2019-03-17 ENCOUNTER — Ambulatory Visit (INDEPENDENT_AMBULATORY_CARE_PROVIDER_SITE_OTHER): Payer: Medicare Other | Admitting: Internal Medicine

## 2019-03-17 ENCOUNTER — Ambulatory Visit (HOSPITAL_COMMUNITY): Payer: Medicare Other | Attending: Internal Medicine

## 2019-03-17 ENCOUNTER — Ambulatory Visit: Payer: Medicare Other

## 2019-03-17 DIAGNOSIS — R0609 Other forms of dyspnea: Secondary | ICD-10-CM

## 2019-03-17 DIAGNOSIS — R0602 Shortness of breath: Secondary | ICD-10-CM

## 2019-03-17 DIAGNOSIS — I350 Nonrheumatic aortic (valve) stenosis: Secondary | ICD-10-CM | POA: Insufficient documentation

## 2019-03-17 DIAGNOSIS — I2510 Atherosclerotic heart disease of native coronary artery without angina pectoris: Secondary | ICD-10-CM | POA: Diagnosis not present

## 2019-03-17 DIAGNOSIS — J449 Chronic obstructive pulmonary disease, unspecified: Secondary | ICD-10-CM

## 2019-03-17 NOTE — Telephone Encounter (Signed)
Yes please

## 2019-03-17 NOTE — Patient Instructions (Signed)
Continue current inhalers.  Use portable oxygen.  Would consider palliative care/hospice to help with home needs and further care planning.

## 2019-03-17 NOTE — Progress Notes (Signed)
Pt in office for qualifying walk. Pt maintained on 2L.

## 2019-03-17 NOTE — Telephone Encounter (Signed)
Order has been placed to [facility]. Nothing further is needed.

## 2019-03-17 NOTE — Progress Notes (Signed)
[facility] Consultation     Virtual Visit via Telephone Note I connected with patient on 03/17/19 at  4:00 PM EDT by telephone and verified that I am speaking with the correct person using two identifiers.   I discussed the limitations, risks, security and privacy concerns of performing an evaluation and management service by telephone and the availability of in person appointments. I also discussed with the patient that there may be a patient responsible charge related to this service. The patient expressed understanding and agreed to proceed. I discussed the assessment and treatment plan with the patient. The patient was provided an opportunity to ask questions and all were answered. The patient agreed with the plan and demonstrated an understanding of the instructions. Please see note below for further detail.    The patient was advised to call back or seek an in-person evaluation if the symptoms worsen or if the condition fails to improve as anticipated.  I provided 15 minutes of non-face-to-face time during this encounter.   Laverle Hobby, MD   Date: 03/17/2019,   MRN# [id number] Jessica [name] November 28, 1925  Former [facility] Patient  PFT's 01/2015 Ratio 54%, Fev1 65%, DLCO 64% TLC 101%, RV 119% Interpretation: moderate COPD with BD response with hyperinflation and air trapping with diffusion impairment  PFT 03/31/2016 Ratio 52% FEV1 72% DLCO 70% TLC 88% RV 90% Interpretation: mild/moderate obstructive disease with BD response  ONO shows hypoxia-oxygen ordered  CHIEF COMPLAINT:   Follow up COPD   HISTORY OF PRESENT ILLNESS   85 yo white female former smoker, quit 35 years ago, has dx of COPD, she is wearing oxygen at 2L, and she is in need of portable oxygen to go outside of the house.  They are currenlty using a home large oxygen tank.  Her breathing has been short, she has been using albuterol and budesonide/brovana.    ECHO shows Severe  AS Findings shared with patient  Overall poor prognosis   No severe resp distress No signs of infection  On oxygen -needs this to survive Current Medication:   Current Outpatient Medications:  .  albuterol (PROVENTIL) (2.5 MG/3ML) 0.083% nebulizer solution, Take 3 mLs (2.5 mg total) by nebulization every 4 (four) hours as needed for wheezing or shortness of breath. DX: COPD J44.9, Disp: 75 mL, Rfl: 12 .  ALPRAZolam (XANAX) 0.5 MG tablet, TAKE ONE TABLET BY MOUTH AT BEDTIME AS NEEDED FOR ANXIETY, Disp: 30 tablet, Rfl: 2 .  AMBULATORY NON FORMULARY MEDICATION, Medication Name: Incentive spirometer Use 10-15 times per day, Disp: 1 each, Rfl: 0 .  arformoterol (BROVANA) 15 MCG/2ML NEBU, Take 2 mLs (15 mcg total) by nebulization 2 (two) times daily. DX: COPD J44.9 (Patient taking differently: Take 15 mcg by nebulization 2 (two) times daily. ), Disp: 120 mL, Rfl: 12 .  benzonatate (TESSALON) 200 MG capsule, Take 1 capsule (200 mg total) by mouth 3 (three) times daily as needed for cough., Disp: 20 capsule, Rfl: 0 .  budesonide (PULMICORT) 0.5 MG/2ML nebulizer solution, Take 2 mLs (0.5 mg total) by nebulization 2 (two) times daily. DX: COPD J44.9, Disp: 120 mL, Rfl: 12 .  Cholecalciferol (VITAMIN D) 1000 UNITS capsule, Take 1,000 Units by mouth daily.  , Disp: , Rfl:  .  ELIQUIS 5 MG TABS tablet, TAKE 1 TABLET BY MOUTH TWICE A DAY, Disp: 60 tablet, Rfl: 5 .  escitalopram (LEXAPRO) 20 MG tablet, Take 1 tablet (20 mg total) by mouth daily., Disp: 90 tablet, Rfl: 1 .  fluticasone (FLONASE) 50 MCG/ACT nasal spray, Place 2 sprays into both nostrils daily., Disp: 16 g, Rfl: 0 .  guaiFENesin (MUCINEX) 600 MG 12 hr tablet, Take 1 tablet (600 mg total) by mouth 2 (two) times daily., Disp: 14 tablet, Rfl: 0 .  loratadine (CLARITIN) 10 MG tablet, Take 1 tablet (10 mg total) by mouth daily., Disp: 10 tablet, Rfl: 0 .  metoprolol tartrate (LOPRESSOR) 25 MG tablet, Take 0.5 tablets (12.5 mg total) by mouth 2  (two) times daily., Disp: 60 tablet, Rfl: 0 .  multivitamin-lutein (OCUVITE-LUTEIN) CAPS capsule, Take 1 capsule by mouth daily., Disp: , Rfl:  .  pantoprazole (PROTONIX) 40 MG tablet, Take 1 tablet (40 mg total) by mouth daily at 12 noon., Disp: 30 tablet, Rfl: 0 .  Respiratory Therapy Supplies (FLUTTER) DEVI, 1 each by Does not apply route daily., Disp: 1 each, Rfl: 0 .  simvastatin (ZOCOR) 20 MG tablet, Take 1 tablet (20 mg total) by mouth at bedtime., Disp: 90 tablet, Rfl: 3 .  traZODone (DESYREL) 50 MG tablet, Take 0.5-1 tablets (25-50 mg total) by mouth at bedtime as needed for sleep., Disp: 90 tablet, Rfl: 0 .  triamcinolone cream (KENALOG) 0.1 %, Apply 1 application topically 2 (two) times daily., Disp: 45 g, Rfl: 0 .  VENTOLIN HFA 108 (90 Base) MCG/ACT inhaler, INHALE 2 PUFFS EVERY 6HRS AS NEEDED FOR WHEEZING OR SHORTNESS OF BREATH. DO NOT USE IF USING NEBS. (Patient taking differently: Inhale 2 puffs into the lungs every 6 (six) hours as needed for wheezing. ), Disp: 18 Inhaler, Rfl: 2     ALLERGIES   Contrast media [iodinated diagnostic agents] and Iohexol     REVIEW OF SYSTEMS   Review of Systems  Constitutional: Negative for chills, fever, malaise/fatigue and weight loss.  HENT: Negative for congestion and hearing loss.   Respiratory: Positive for cough and sputum production. Negative for hemoptysis, shortness of breath and wheezing.        Chronic cough and chronic sputum production  Cardiovascular: Negative for chest pain, palpitations, orthopnea and leg swelling.  Gastrointestinal: Negative for heartburn and nausea.  Skin: Negative for rash.  Neurological: Negative for dizziness and headaches.  All other systems reviewed and are negative.  There were no vitals taken for this visit.     ASSESSMENT/PLAN   85 yo white female with Mild/Moderate COPD Gold Stage C  With chronic hypoxic resp failure, patient very frail, in the setting of severe Aortic stenosis    Moderate COPD GOLD STAGE D Progressive SOB and DOE, continue nebs,  Will start portable oxygen so that her family can travel with her. Though warned them that she appears to be at high risk of further decline, and recommended that they consider a hospice/palliative care. They would like to hold off and discuss further with the heart doctor.  Continue Pulmicort, Brovana NEBS Albuterol as needed   Chest congestion and cough incentive spirometry and flutter valve ordered     SEVERE AS Follow up cardiology   Prognosis is poor  Deep Ashby Dawes, M.D., F.C.C.P.  Board Certified in Internal Medicine, Pulmonary Medicine, [place], and Sleep Medicine.  [facility] Office Number: [phone]

## 2019-03-17 NOTE — Telephone Encounter (Signed)
Pt in office for qualifying walk.  Pt dropped to 88% on lap 1. Applied 2L cont and pt maintained at 93%.  DK please advise if okay to order oxygen with exertion? Thanks

## 2019-03-22 ENCOUNTER — Encounter: Payer: Self-pay | Admitting: Surgery

## 2019-03-22 ENCOUNTER — Institutional Professional Consult (permissible substitution) (INDEPENDENT_AMBULATORY_CARE_PROVIDER_SITE_OTHER): Payer: Medicare Other | Admitting: Surgery

## 2019-03-22 ENCOUNTER — Other Ambulatory Visit: Payer: Self-pay

## 2019-03-22 VITALS — BP 93/52 | HR 97 | Temp 97.5°F | Resp 20 | Ht 64.0 in | Wt 134.0 lb

## 2019-03-22 DIAGNOSIS — I2510 Atherosclerotic heart disease of native coronary artery without angina pectoris: Secondary | ICD-10-CM

## 2019-03-22 DIAGNOSIS — I350 Nonrheumatic aortic (valve) stenosis: Secondary | ICD-10-CM

## 2019-03-22 NOTE — Progress Notes (Signed)
Patient ID: Jessica [name], female   DOB: [birth year], 85 y.o.   MRN: [id number]  [facility] CONSULTATION REPORT  Referring Provider is Gollan, Kathlene November, MD Primary Cardiologist is [name], MD PCP is [name], [name], MD  Chief Complaint  Patient presents with   Aortic Stenosis    Surgical eval for TAVR, review all studies    HPI:  The patient is a 85 year old woman with a history of severe COPD, coronary artery disease, and severe aortic stenosis who was worked up for consideration of TAVR in September 2019 after presenting with shortness of breath.  Her cardiac catheterization on 07/07/2018 showed a heavily calcified 80% proximal LAD stenosis and she underwent atherectomy and stenting on 07/23/2018.  She was premedicated with steroids and Benadryl due to previous known allergy to intravenous contrast.  Despite this she developed a delayed contrast reaction resulting in pruritus, generalized edema, and hypotension.  Due to her severe reaction she was premedicated before and after her CT scans and kept as an inpatient.  She did well with this with no significant reaction.  CT scan did show an incidental finding of an acute right lung pulmonary embolism and lower extremity Doppler showed right leg DVT.  She was started on Eliquis and continued on Plavix for 6 months before considering TAVR.  She was admitted in February 2020 with pneumonia and likely COPD exacerbation with acute congestive heart failure.  She was seen back in the emergency room in March with orthostasis and urinary tract infection.  She was seen in a telehealth visit on 02/10/2019 by our PA Nell Range and still had some shortness of breath with exertion but was fairly stable.  Her Plavix was discontinued since it has been 6 months since her PCI with plans for follow-up.  She had a follow-up echocardiogram on 03/17/2019 which showed an increase in  the mean gradient across aortic valve to 53 mmHg with a peak gradient of 73 mmHg.  Aortic valve area was 0.58 cm.  Left ventricular ejection fraction was 60 to 65%.  The patient is here today with her daughter who lives with her.  She said that they went to the beach last weekend and the patient could not really do anything due to exertional shortness of breath.  She has shortness of breath with walking across the room and gets very short of breath going to the bathroom with panic attacks.  She denies orthopnea and PND.  She has had some lower extremity edema.  She denies any chest discomfort.  She has had no dizziness or syncope.  Her daughter has noted a marked worsening of her shortness of breath over the past few months.  Past Medical History:  Diagnosis Date   Anxiety    congenital nystagmus    COPD (chronic obstructive pulmonary disease) (HCC)    Coronary artery disease    DJD (degenerative joint disease)    Fibromyalgia    Low back pain syndrome    Lung nodules    Memory loss    Other and unspecified hyperlipidemia    Pancreatic lesion    Severe aortic stenosis    Venous insufficiency     Past Surgical History:  Procedure Laterality Date   ABDOMINAL HYSTERECTOMY     ANTERIOR CERVICAL DISCECTOMY     CARPAL TUNNEL RELEASE  12/2011   right arm   CATARACT EXTRACTION     CORONARY ATHERECTOMY  07/23/2018  PTCA/orbital atherectomy/DES x 1 proximal to mid LAD   CORONARY ATHERECTOMY N/A 07/23/2018   Procedure: CORONARY ATHERECTOMY;  Surgeon: Burnell Blanks, MD;  Location: [facility];  Service: Cardiovascular;  Laterality: N/A;   CORONARY STENT INTERVENTION     CORONARY STENT INTERVENTION N/A 07/23/2018   Procedure: CORONARY STENT INTERVENTION;  Surgeon: Burnell Blanks, MD;  Location: [facility];  Service: Cardiovascular;  Laterality: N/A;   LUMBAR LAMINECTOMY     right knee arthroscopy     right shoulder replacement  04/2009     right shoulder surgery  2008   Dr. Noemi Chapel   RIGHT/LEFT HEART CATH AND CORONARY ANGIOGRAPHY N/A 07/07/2018   Procedure: RIGHT/LEFT HEART CATH AND CORONARY ANGIOGRAPHY;  Surgeon: Burnell Blanks, MD;  Location: [facility];  Service: Cardiovascular;  Laterality: N/A;   ULTRASOUND GUIDANCE FOR VASCULAR ACCESS  07/07/2018   Procedure: Ultrasound Guidance For Vascular Access;  Surgeon: Burnell Blanks, MD;  Location: [facility];  Service: Cardiovascular;;    Family History  Problem Relation Age of Onset   Other Mother        broken hip   Other Father        kidney problems   Colon cancer Neg Hx    Stomach cancer Neg Hx    Rectal cancer Neg Hx    Pancreatic cancer Neg Hx     Social History   Socioeconomic History   Marital status: Widowed    Spouse name: Not on file   Number of children: 2   Years of education: Not on file   Highest education level: Not on file  Occupational History   Occupation: Still works,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, product/process development strain: Not on file   Food insecurity:    Worry: Not on file    Inability: Not on file   Transportation needs:    Medical: Not on file    Non-medical: Not on file  Tobacco Use   Smoking status: Former Smoker    Packs/day: 2.00    Years: 30.00    Pack years: 60.00    Types: Cigarettes    Last attempt to quit: 10/28/1991    Years since quitting: 27.4   Smokeless tobacco: Never Used  Substance and Sexual Activity   Alcohol use: Yes    Comment: 1 drink per night   Drug use: No   Sexual activity: Not Currently  Lifestyle   Physical activity:    Days per week: Not on file    Minutes per session: Not on file   Stress: Not on file  Relationships   Social connections:    Talks on phone: Not on file    Gets together: Not on file    Attends religious service: Not on file    Active member of club or organization: Not on file    Attends meetings of clubs or  organizations: Not on file    Relationship status: Not on file   Intimate partner violence:    Fear of current or ex partner: Not on file    Emotionally abused: Not on file    Physically abused: Not on file    Forced sexual activity: Not on file  Other Topics Concern   Not on file  Social History Narrative   1 sibling alive age 64   1 sibling alive age 70   1 sibling deceased age 76   1 sibling alive age 70  Current Outpatient Medications  Medication Sig Dispense Refill   albuterol (PROVENTIL) (2.5 MG/3ML) 0.083% nebulizer solution Take 3 mLs (2.5 mg total) by nebulization every 4 (four) hours as needed for wheezing or shortness of breath. DX: COPD J44.9 75 mL 12   ALPRAZolam (XANAX) 0.5 MG tablet TAKE ONE TABLET BY MOUTH AT BEDTIME AS NEEDED FOR ANXIETY 30 tablet 2   AMBULATORY NON FORMULARY MEDICATION Medication Name: Incentive spirometer Use 10-15 times per day 1 each 0   arformoterol (BROVANA) 15 MCG/2ML NEBU Take 2 mLs (15 mcg total) by nebulization 2 (two) times daily. DX: COPD J44.9 (Patient taking differently: Take 15 mcg by nebulization 2 (two) times daily. ) 120 mL 12   benzonatate (TESSALON) 200 MG capsule Take 1 capsule (200 mg total) by mouth 3 (three) times daily as needed for cough. 20 capsule 0   budesonide (PULMICORT) 0.5 MG/2ML nebulizer solution Take 2 mLs (0.5 mg total) by nebulization 2 (two) times daily. DX: COPD J44.9 120 mL 12   Cholecalciferol (VITAMIN D) 1000 UNITS capsule Take 1,000 Units by mouth daily.       ELIQUIS 5 MG TABS tablet TAKE 1 TABLET BY MOUTH TWICE A DAY 60 tablet 5   escitalopram (LEXAPRO) 20 MG tablet Take 1 tablet (20 mg total) by mouth daily. 90 tablet 1   fluticasone (FLONASE) 50 MCG/ACT nasal spray Place 2 sprays into both nostrils daily. 16 g 0   guaiFENesin (MUCINEX) 600 MG 12 hr tablet Take 1 tablet (600 mg total) by mouth 2 (two) times daily. 14 tablet 0   loratadine (CLARITIN) 10 MG tablet Take 1 tablet (10 mg total)  by mouth daily. 10 tablet 0   metoprolol tartrate (LOPRESSOR) 25 MG tablet Take 0.5 tablets (12.5 mg total) by mouth 2 (two) times daily. 60 tablet 0   multivitamin-lutein (OCUVITE-LUTEIN) CAPS capsule Take 1 capsule by mouth daily.     pantoprazole (PROTONIX) 40 MG tablet Take 1 tablet (40 mg total) by mouth daily at 12 noon. 30 tablet 0   Respiratory Therapy Supplies (FLUTTER) DEVI 1 each by Does not apply route daily. 1 each 0   simvastatin (ZOCOR) 20 MG tablet Take 1 tablet (20 mg total) by mouth at bedtime. 90 tablet 3   traZODone (DESYREL) 50 MG tablet Take 0.5-1 tablets (25-50 mg total) by mouth at bedtime as needed for sleep. 90 tablet 0   triamcinolone cream (KENALOG) 0.1 % Apply 1 application topically 2 (two) times daily. 45 g 0   VENTOLIN HFA 108 (90 Base) MCG/ACT inhaler INHALE 2 PUFFS EVERY 6HRS AS NEEDED FOR WHEEZING OR SHORTNESS OF BREATH. DO NOT USE IF USING NEBS. (Patient taking differently: Inhale 2 puffs into the lungs every 6 (six) hours as needed for wheezing. ) 18 Inhaler 2   No current facility-administered medications for this visit.     Allergies  Allergen Reactions   Contrast Media [Iodinated Diagnostic Agents] Itching, Rash and Other (See Comments)    Hypotension, Skin turns red like a sunburn   Iohexol Itching, Rash and Other (See Comments)    Hypotension, Skin turns red like a sunburn      Review of Systems:   General:  normal appetite, + decreased energy, no weight gain, no weight loss, no fever  Cardiac:  no chest pain with exertion, no chest pain at rest, +SOB with any exertion, + resting SOB, no PND, no orthopnea, no palpitations, no arrhythmia, no atrial fibrillation, + LE edema, no dizzy spells, no syncope  Respiratory:  + shortness of breath, + home oxygen, no productive cough, no dry cough, no bronchitis, + wheezing, no hemoptysis, no asthma, no pain with inspiration or cough, no sleep apnea, no CPAP at night  GI:   no difficulty swallowing,  no reflux, no frequent heartburn, no hiatal hernia, no abdominal pain, no constipation, no diarrhea, no hematochezia, no hematemesis, no melena  GU:   no dysuria,  no frequency, no urinary tract infection, no hematuria, no kidney stones, no kidney disease  Vascular:  no pain suggestive of claudication, no pain in feet, + leg cramps, no varicose veins, no DVT, no non-healing foot ulcer  Neuro:   no stroke, no TIA's, no seizures, no headaches, no temporary blindness one eye,  no slurred speech, no peripheral neuropathy, no chronic pain, + instability of gait, no memory/cognitive dysfunction  Musculoskeletal: + arthritis, no joint swelling, no myalgias, + difficulty walking, + reduced mobility   Skin:   no rash, no itching, no skin infections, no pressure sores or ulcerations  Psych:   + anxiety, no depression, no nervousness, no unusual recent stress  Eyes:   no blurry vision, no floaters, no recent vision changes, + wears glasses or contacts  ENT:   + hearing loss, no loose or painful teeth, edentulous  Hematologic:  no easy bruising, no abnormal bleeding, no clotting disorder, no frequent epistaxis  Endocrine:  no diabetes, does not check CBG's at home      Physical Exam:   BP (!) 93/52    Pulse 97    Temp (!) 97.5 F (36.4 C) (Skin)    Resp 20    Ht 5\' 4"  (1.626 m)    Wt 134 lb (60.8 kg)    SpO2 (!) 86% Comment: RA   BMI 23.00 kg/m   General:  Elderly but looks good for 89  HEENT:  Unremarkable, NCAT, PERLA,   Neck:   no JVD, no bruits, no adenopathy or thyromegaly  Chest:   clear to auscultation, symmetrical breath sounds, expiratory wheezes, no rhonchi   CV:   RRR, grade lll/VI crescendo/decrescendo murmur heard best at RSB,  no diastolic murmur  Abdomen:  soft, non-tender, no masses   Extremities:  warm, well-perfused, pulses palpable in feet, mild LE edema  Rectal/GU  Deferred  Neuro:   Grossly non-focal and symmetrical throughout  Skin:   Clean and dry, no rashes, no  breakdown   Diagnostic Tests:  Physicians   Panel Physicians Referring Physician Case Authorizing Physician  Burnell Blanks, MD (Primary)    Procedures   RIGHT/LEFT HEART CATH AND CORONARY ANGIOGRAPHY  Conclusion     Prox LAD lesion is 80% stenosed.  Ost 1st Mrg to 1st Mrg lesion is 40% stenosed.   1. Severe, heavily calcified (80%) stenosis in the proximal LAD just before the takeoff the moderate caliber diagonal branch.  2. Mild non-obstructive disease in the Circumflex arter 3. The RCA is a large caliber vessel with no obstructive disease 4. Severe aortic valve stenosis (peak to peak gradient 44 mmHg, mean gradient 42.1 mmHg, AVA 1.41 cm2)  Recommendations: Her aortic valve is heavily calcified and was difficult to cross. The mean gradient suggests severe stenosis. This is c/w data from her echo which suggested severe aortic stenosis. She will need TAVR. She will need PCI of the LAD stenosis prior to TAVR. This will require orbital atherectomy given heavy calcification of the stenosis. I will review timing with the TAVR team. She will also need to  have dental extractions prior to TAVR. Will discuss with Dr. [name] and plan timing of dental extraction with PCI/stenting of the LAD.     Procedural Details   Technical Details Indication: 85 yo female with history of severe aortic stenosis, COPD, fibromyalgia, hyperlipidemia who is here today for cardiac cath in workup for TAVR. Her aortic stenosis has been moderate for the last several years. She has been followed by Dr. [name]. She has had recent worsening of her dyspnea. Echo 06/10/18 with LVEF=55-60%, moderate LVH, grade 1 diastolic dysfunction. The aortic valve is thickened with severely thickened leaflets. Mean gradient 49 mmHg, peak gradient 80 mmHg. DVI 0.15. AVA 0.47 cm2.   Procedure: The risks, benefits, complications, treatment options, and expected outcomes were discussed with the patient. The patient and/or  family concurred with the proposed plan, giving informed consent. The patient was brought to the cath lab after IV hydration was given. The patient was sedated with Versed. The right wrist was prepped and draped in a sterile fashion. 1% lidocaine was used for local anesthesia. Using the modified Seldinger access technique, a 5 French sheath was placed in the right radial artery. 3 mg Verapamil was given through the sheath. 3000 units IV heparin was given. I attempted to change out the IV catheter in the right antecubital vein over a wire but the wire would not advance. I then used u/s guidance to gain access in the right femoral vein. Images of the u/s saved and stored and printed to the patient's chart. Right heart catheterization performed with a balloon tipped catheter. Standard diagnostic catheters were used to perform selective coronary angiography. I crossed the aortic valve with an AL-3 and a straight wire. LV pressures measured. The sheath was removed from the right radial artery and a Terumo hemostasis band was applied at the arteriotomy site on the right wrist.      Estimated blood loss <50 mL.  During this procedure the patient was administered the following to achieve and maintain moderate conscious sedation: Versed 0.5 mg,  while the patient's heart rate, blood pressure, and oxygen saturation were continuously monitored. The period of conscious sedation was 48 minutes, of which I was present face-to-face 100% of this time.  Medications  (Filter: Administrations occurring from 07/07/18 1315 to 07/07/18 1446)  Medication Rate/Dose/Volume Action  Date Time   midazolam (VERSED) injection (mg) 0.5 mg Given 07/07/18 1350   Total dose as of 03/22/19 1639        0.5 mg        lidocaine (PF) (XYLOCAINE) 1 % injection (mL) 2 mL Given 07/07/18 1350   Total dose as of 03/22/19 1639 12 mL Given 1401   14 mL        Heparin (Porcine) in NaCl 1000-0.9 UT/500ML-% SOLN (mL) 500 mL Given 07/07/18 1358    Total dose as of 03/22/19 1639 500 mL Given 1358   1,000 mL        Radial Cocktail/Verapamil only (mL) 10 mL Given 07/07/18 1406   Total dose as of 03/22/19 1639        10 mL        heparin injection (Units) 3,000 Units Given 07/07/18 1415   Total dose as of 03/22/19 1639        3,000 Units        iohexol (OMNIPAQUE) 350 MG/ML injection (mL) 50 mL Given 07/07/18 1445   Total dose as of 03/22/19 1639        50 mL  Sedation Time   Sedation Time Physician-1: 48 minutes 2 seconds  Complications   Complications documented before study signed (07/07/2018 2:50 PM EDT)    RIGHT/LEFT HEART CATH AND CORONARY ANGIOGRAPHY   None Documented by Burnell Blanks, MD 07/07/2018 2:42 PM EDT  Time Range: Intraprocedure      Coronary Findings   Diagnostic  Dominance: Right  Left Anterior Descending  Vessel is large.  Prox LAD lesion 80% stenosed  Prox LAD lesion is 80% stenosed. The lesion is calcified.  First Diagonal Branch  Vessel is moderate in size.  Left Circumflex  First Obtuse Marginal Branch  Vessel is moderate in size.  Ost 1st Mrg to 1st Mrg lesion 40% stenosed  Ost 1st Mrg to 1st Mrg lesion is 40% stenosed.  Right Coronary Artery  Vessel is large.  Intervention   No interventions have been documented.  Coronary Diagrams   Diagnostic  Dominance: Right    Intervention   Implants    No implant documentation for this case.  Syngo Images   Show images for CARDIAC CATHETERIZATION  Images on Long Term Storage   Show images for [place], MYRON LONA to Procedure Log   Procedure Log    Hemo Data    Most Recent Value  Fick Cardiac Output 9.65 L/min  Fick Cardiac Output Index 6.17 (L/min)/BSA  Aortic Mean Gradient 42.1 mmHg  Aortic Peak Gradient 44 mmHg  Aortic Valve Area 1.41  Aortic Value Area Index 0.9 cm2/BSA  RA A Wave 5 mmHg  RA V Wave 2 mmHg  RA Mean 2 mmHg  RV Systolic Pressure 35 mmHg  RV Diastolic Pressure 1 mmHg  RV EDP 4 mmHg   PA Systolic Pressure 36 mmHg  PA Diastolic Pressure 10 mmHg  PA Mean 21 mmHg  PW A Wave 10 mmHg  PW V Wave 7 mmHg  PW Mean 7 mmHg  AO Systolic Pressure 400 mmHg  AO Diastolic Pressure 70 mmHg  AO Mean 867 mmHg  LV Systolic Pressure 619 mmHg  LV Diastolic Pressure 7 mmHg  LV EDP 11 mmHg  AOp Systolic Pressure 509 mmHg  AOp Diastolic Pressure 65 mmHg  AOp Mean Pressure 326 mmHg  LVp Systolic Pressure 712 mmHg  LVp Diastolic Pressure 7 mmHg  LVp EDP Pressure 11 mmHg  QP/QS 1  TPVR Index 3.41 HRUI  TSVR Index 17.2 HRUI  PVR SVR Ratio 0.13  TPVR/TSVR Ratio 0.2      ECHOCARDIOGRAM COMPLETE  Order #: [id number] Accession #: [id number]  Patient Info   Patient name: ARBELL WYCOFF  MRN: [id number]  Age: 85 y.o.  Sex: female  Vitals   BP Height Weight BSA (Calculated - sq m)  93/52 5\' 4"  (1.626 m) 134 lb (60.8 kg) 1.66 sq meters  Result Notes for ECHOCARDIOGRAM COMPLETE   Notes recorded by Eileen Stanford, PA-C on 03/18/2019 at 9:44 AM EDT Gradients are worse. She has critical AS and progressive symptoms. Plan to have her seen by CT surgery and proceed with TAVR      Study Result   Result status: Final result     ECHOCARDIOGRAM REPORT       Patient Name:   LASHANTE FRYBERGER Date of Exam: 03/17/2019 Medical Rec #:  [id number]        Height:       64.0 in Accession #:    [id number]       Weight:       140.0 lb Date of  Birth:  Sep 26, 1926        BSA:          1.68 m Patient Age:    52 years         BP:           157/89 mmHg Patient Gender: F                HR:           109 bpm. Exam Location:  [facility]    Procedure: 2D Echo, Cardiac Doppler and Color Doppler  Indications:    I35.0 Aortic Stenosis   History:        Patient has prior history of Echocardiogram examinations, most                 recent 06/10/2018. CAD COPD Risk Factors: HLD.   Sonographer:    Marygrace Drought RCS Referring Phys: [id number] [place]    1. The  left ventricle has normal systolic function with an ejection fraction of 60-65%. The cavity size was mildly dilated. Left ventricular diastolic Doppler parameters are indeterminate.  2. The right ventricle has normal systolic function. The cavity was normal. There is no increase in right ventricular wall thickness.  3. The mitral valve is abnormal. Mild thickening of the mitral valve leaflet. Moderate calcification of the mitral valve leaflet. There is moderate mitral annular calcification present.  4. The tricuspid valve is grossly normal.  5. The aortic valve is abnormal. Severely thickening of the aortic valve. Severe calcifcation of the aortic valve. Severe stenosis of the aortic valve.  6. The aortic root is normal in size and structure.  7. The interatrial septum was not assessed.  FINDINGS  Left Ventricle: The left ventricle has normal systolic function, with an ejection fraction of 60-65%. The cavity size was mildly dilated. There is no increase in left ventricular wall thickness. Left ventricular diastolic Doppler parameters are  indeterminate.  Right Ventricle: The right ventricle has normal systolic function. The cavity was normal. There is no increase in right ventricular wall thickness.  Left Atrium: Left atrial size was normal in size.  Right Atrium: Right atrial size was normal in size. Right atrial pressure is estimated at 3 mmHg.  Interatrial Septum: The interatrial septum was not assessed.  Pericardium: There is no evidence of pericardial effusion.  Mitral Valve: The mitral valve is abnormal. Mild thickening of the mitral valve leaflet. Moderate calcification of the mitral valve leaflet. There is moderate mitral annular calcification present. Mitral valve regurgitation is mild by color flow Doppler.  Tricuspid Valve: The tricuspid valve is grossly normal. Tricuspid valve regurgitation is mild by color flow Doppler.  Aortic Valve: The aortic valve is abnormal  Severely thickening of the aortic valve. Severe calcifcation of the aortic valve. Aortic valve regurgitation was not visualized by color flow Doppler. There is Severe stenosis of the aortic valve, with a  calculated valve area of 0.85 cm.  Pulmonic Valve: The pulmonic valve was grossly normal. Pulmonic valve regurgitation is not visualized by color flow Doppler.  Aorta: The aortic root is normal in size and structure.    +--------------+--------++  LEFT VENTRICLE            +----------------+---------++ +--------------+--------++  Diastology                    PLAX 2D                   +----------------+---------++ +--------------+--------++  LV e' lateral:   6.74 cm/s    LVIDd:         5.24 cm    +----------------+---------++ +--------------+--------++  LV E/e' lateral: 16.0         LVIDs:         4.05 cm    +----------------+---------++ +--------------+--------++  LV e' medial:    5.22 cm/s    LV PW:         1.28 cm    +----------------+---------++ +--------------+--------++  LV E/e' medial:  20.7         LV IVS:        1.00 cm    +----------------+---------++ +--------------+--------++  LVOT diam:     2.10 cm    +--------------+--------++  LV SV:         60 ml      +--------------+--------++  LV SV Index:   35.09      +--------------+--------++  LVOT Area:     3.46 cm   +--------------+--------++                            +--------------+--------++  +---------------+----------++  RIGHT VENTRICLE              +---------------+----------++  RV Basal diam:  4.67 cm      +---------------+----------++  RV S prime:     15.90 cm/s   +---------------+----------++  TAPSE (M-mode): 2.5 cm       +---------------+----------++  RVSP:           57.2 mmHg    +---------------+----------++  +---------------+-------++-----------++  LEFT ATRIUM              Index         +---------------+-------++-----------++  LA diam:        4.50 cm  2.68 cm/m     +---------------+-------++-----------++  LA Vol (A2C):   82.7 ml  49.19 ml/m   +---------------+-------++-----------++  LA Vol (A4C):   59.7 ml  35.51 ml/m   +---------------+-------++-----------++  LA Biplane Vol: 77.3 ml  45.98 ml/m   +---------------+-------++-----------++ +------------+---------++-----------++  RIGHT ATRIUM            Index         +------------+---------++-----------++  RA Pressure: 3.00 mmHg                +------------+---------++-----------++  RA Area:     18.40 cm                +------------+---------++-----------++  RA Volume:   52.20 ml   31.05 ml/m   +------------+---------++-----------++  +------------------+------------++  AORTIC VALVE                      +------------------+------------++  AV Area (Vmax):    0.68 cm       +------------------+------------++  AV Area (Vmean):   0.58 cm       +------------------+------------++  AV Area (VTI):     0.85 cm       +------------------+------------++  AV Vmax:           428.00 cm/s    +------------------+------------++  AV Vmean:          350.000 cm/s   +------------------+------------++  AV VTI:            0.887 m        +------------------+------------++  AV Peak Grad:      73.3 mmHg      +------------------+------------++  AV Mean Grad:      53.0 mmHg      +------------------+------------++  LVOT Vmax:         84.20 cm/s     +------------------+------------++  LVOT Vmean:        59.100 cm/s    +------------------+------------++  LVOT VTI:          0.218 m        +------------------+------------++  LVOT/AV VTI ratio: 0.25           +------------------+------------++   +-------------+-------++  AORTA                   +-------------+-------++  Ao Root diam: 3.20 cm   +-------------+-------++  +--------------+----------++  +---------------+-----------++  MITRAL VALVE                  TRICUSPID VALVE               +--------------+----------++  +---------------+-----------++  MV  Area (PHT): 9.37 cm       TR Peak grad:   54.2 mmHg     +--------------+----------++  +---------------+-----------++  MV Peak grad:  9.6 mmHg       TR Vmax:        392.00 cm/s   +--------------+----------++  +---------------+-----------++  MV Mean grad:  5.0 mmHg       Estimated RAP:  3.00 mmHg     +--------------+----------++  +---------------+-----------++  MV Vmax:       1.55 m/s       RVSP:           57.2 mmHg     +--------------+----------++  +---------------+-----------++  MV Vmean:      104.0 cm/s   +--------------+----------++  +--------------+-------+  MV VTI:        0.22 m         SHUNTS                  +--------------+----------++  +--------------+-------+  MV PHT:        23.49 msec     Systemic VTI:  0.22 m   +--------------+----------++  +--------------+-------+  MV Decel Time: 81 msec        Systemic Diam: 2.10 cm  +--------------+----------++  +--------------+-------+ +--------------+-----------++  MV E velocity: 108.00 cm/s   +--------------+-----------++  MV A velocity: 118.00 cm/s   +--------------+-----------++  MV E/A ratio:  0.92          +--------------+-----------++    Dorris Carnes MD Electronically signed by Dorris Carnes MD Signature Date/Time: 03/17/2019/9:46:45 PM        ADDENDUM REPORT: 08/25/2018 10:12  EXAM: OVER-READ INTERPRETATION  CT CHEST  The following report is an over-read performed by radiologist Dr. Samara Snide [facility] on 08/25/2018. This over-read does not include interpretation of cardiac or coronary anatomy or pathology. The coronary CTA interpretation by the cardiologist is attached.  COMPARISON:  12/28/2014 chest CT.  FINDINGS: Please see the separate concurrent chest CT angiogram report for details.  IMPRESSION: Please see the separate concurrent chest CT angiogram report for details.   Electronically Signed   By: Ilona Sorrel M.D.   On: 08/25/2018 10:12   Addended by Sharyn Blitz, MD on 08/25/2018 10:14 AM    Study Result   CLINICAL DATA:  Aortic Stenosis  EXAM: Cardiac TAVR CT  TECHNIQUE: The patient was scanned on a Siemens Force 409 slice scanner. A 120 kV retrospective scan was triggered in the ascending thoracic aorta at 140 HU's.  Gantry rotation speed was 250 msecs and collimation was .6 mm. No beta blockade or nitro were given. The 3D data set was reconstructed in 5% intervals of the R-R cycle. Systolic and diastolic phases were analyzed on a dedicated work station using MPR, MIP and VRT modes. The patient received 80 cc of contrast.  FINDINGS: Aortic Valve: Tri leaflet and calcified with restricted leaflet motion  Aorta: Normal arch vessels severe calcific atherosclerosis  Sino-tubular Junction: 29 mm  Ascending Thoracic Aorta: 35 mm  Aortic Arch: 27 mm  Descending Thoracic Aorta: 23 mm  Sinus of Valsalva Measurements:  Non-coronary: 34.5 mm  Right - coronary: 33 mm  Left -   coronary: 32.5 mm  Coronary Artery Height above Annulus:  Left Main: 16.7 mm above annulus  Right Coronary: 15.5 mm above annulus  Virtual Basal Annulus Measurements:  Maximum / Minimum Diameter: 26.3 mm x 21.7 mm  Perimeter: 76 mm  Area: 436 mm2  Coronary Arteries: Sufficient height above annulus for deployment  Optimum Fluoroscopic Angle for Delivery: LAO 14 Caudal 19 degrees  IMPRESSION: 1. Tri leaflet AV with annular area of 436 mm2 suitable for a 26 mm Sapien 3 valve  2.  Normal aortic root 3.5 cm  3. Optimum angiographic angle for deployment LAO 14 Caudal 19 degrees  4.  Coronary arteries sufficient height above annulus for deployment  5.  No LAA thrombus  Jenkins Rouge  Electronically Signed: By: Jenkins Rouge M.D. On: 08/24/2018 18:12      CLINICAL DATA:  Severe symptomatic aortic stenosis with dyspnea on exertion. Pre-TAVR evaluation.  EXAM: CT ANGIOGRAPHY CHEST, ABDOMEN AND  PELVIS  TECHNIQUE: Multidetector CT imaging through the chest, abdomen and pelvis was performed using the standard protocol during bolus administration of intravenous contrast. Multiplanar reconstructed images and MIPs were obtained and reviewed to evaluate the vascular anatomy.  CONTRAST:  160mL ISOVUE-370 IOPAMIDOL (ISOVUE-370) INJECTION 76%  COMPARISON:  12/28/2014 chest CT.  06/12/2010 CT abdomen/pelvis.  FINDINGS: CTA CHEST FINDINGS  Cardiovascular: Normal heart size. No significant pericardial effusion/thickening. Left anterior descending and left circumflex coronary atherosclerosis. Severe thickening and calcification of the aortic valve. Atherosclerotic nonaneurysmal thoracic aorta. Normal caliber pulmonary arteries. Incidentally noted are acute pulmonary emboli within lobar and segmental branches of the right upper and right middle lobes.  Mediastinum/Nodes: No discrete thyroid nodules. Unremarkable esophagus. No pathologically enlarged axillary, mediastinal or hilar lymph nodes.  Lungs/Pleura: No pneumothorax. No pleural effusion. Mild centrilobular emphysema with diffuse bronchial wall thickening. Posterior apical right upper lobe 8 x 6 mm solid pulmonary nodule (series 5/image 13) appears slightly increased from 8 x 4 mm on 12/28/2014 chest CT. Basilar left lower lobe 6 mm solid pulmonary nodule (series 5/image 89) is stable and considered benign. No acute consolidative airspace disease, lung masses or new significant pulmonary nodules.  Musculoskeletal: No aggressive appearing focal osseous lesions. Partially visualized right shoulder arthroplasty. Moderate thoracic spondylosis.  CTA ABDOMEN AND PELVIS FINDINGS  Hepatobiliary: Normal liver with no liver mass. Normal gallbladder with no radiopaque cholelithiasis. No intrahepatic biliary ductal dilatation. CBD diameter 6-7 mm, within normal limits for age.  Pancreas: Cystic 2.3 x 1.5 cm pancreatic  head lesion (series 4/image 116), increased from 1.2 x 0.7 cm on 06/12/2010 CT. No additional pancreatic lesions. No pancreatic duct dilation.  Spleen: Normal size. No mass.  Adrenals/Urinary Tract: Normal adrenals. Simple 1.8 cm interpolar left renal cyst. Subcentimeter hypodense renal cortical lesion in the lower left kidney is too small to characterize and requires no follow-up.  Multiple parapelvic renal cysts in both kidneys. No hydronephrosis. No additional contour deforming renal lesions. Normal bladder.  Stomach/Bowel: Normal non-distended stomach. Normal caliber small bowel with no small bowel wall thickening. Appendix not discretely visualized. No pericecal inflammatory changes. Marked sigmoid diverticulosis, with no large bowel wall thickening or significant pericolonic fat stranding.  Vascular/Lymphatic: Atherosclerotic nonaneurysmal abdominal aorta. Patent renal and splenic veins. No pathologically enlarged lymph nodes in the abdomen or pelvis.  Reproductive: Status post hysterectomy, with no abnormal findings at the vaginal cuff. No adnexal mass.  Other: No pneumoperitoneum, ascites or focal fluid collection.  Musculoskeletal: No aggressive appearing focal osseous lesions. Marked lumbar spondylosis.  VASCULAR MEASUREMENTS PERTINENT TO TAVR:  AORTA:  Minimal Aortic Diameter-12.4 x 11.9 mm (infrarenal abdominal aorta on series 4/image 122)  Severity of Aortic Calcification-moderate  RIGHT PELVIS:  Right Common Iliac Artery -  Minimal Diameter-8.9 x 7.8 mm  Tortuosity-mild  Calcification-moderate  Right External Iliac Artery -  Minimal Diameter-7.7 x 7.1 mm  Tortuosity-moderate  Calcification-none  Right Common Femoral Artery -  Minimal Diameter-7.3 x 6.7 mm  Tortuosity-moderate  Calcification-mild  LEFT PELVIS:  Left Common Iliac Artery -  Minimal Diameter-9.7 x 8.9  mm  Tortuosity-mild  Calcification-moderate  Left External Iliac Artery -  Minimal Diameter-7.1 x 6.9 mm  Tortuosity-moderate  Calcification-mild  Left Common Femoral Artery -  Minimal Diameter-7.7 x 7.1 mm  Tortuosity-mild  Calcification-mild  Review of the MIP images confirms the above findings.  IMPRESSION: 1. Incidental acute pulmonary emboli within the lobar and segmental branches of the right upper and right middle lobes. 2. Vascular findings and measurements pertinent to potential TAVR procedure, as detailed above. 3. Severe thickening and calcification of the aortic valve, compatible with the reported clinical history of severe aortic stenosis. 4. Aortic Atherosclerosis (ICD10-I70.0) and Emphysema (ICD10-J43.9). 5. Two-vessel coronary atherosclerosis. 6. Posterior apical right upper lobe 8 x 6 mm solid pulmonary nodule appears slightly increased in size since 2016 chest CT, potentially artifactual due to technical scan differences. Recommend attention on follow-up chest CT in 3-6 months. 7. Cystic 2.3 cm pancreatic head lesion is increased in size since 2011 CT abdomen study. No biliary or pancreatic duct dilation or other high risk features on this routine CT study. MRI abdomen without and with IV contrast is indicated for further characterization, and may be performed as clinically warranted. Alternatively, follow-up pancreas protocol CT abdomen without and with IV contrast may be obtained in 6-12 months. 8. Marked sigmoid diverticulosis.  Critical Value/emergent results were called by telephone at the time of interpretation on 08/25/2018 at 10:11 am to Dr. Lauree Chandler , who verbally acknowledged these results.   Electronically Signed   By: Ilona Sorrel M.D.   On: 08/25/2018 10:14   CLINICAL DATA:  Follow-up lung nodule.  EXAM: CT CHEST WITHOUT CONTRAST  TECHNIQUE: Multidetector CT imaging of the chest was performed  following the standard protocol without IV contrast.  COMPARISON:  CTA chest, abdomen, and pelvis 08/24/2018. Chest CT 12/28/2014.  FINDINGS: Cardiovascular: Thoracic aortic atherosclerosis without aneurysm. LAD and left circumflex coronary artery atherosclerosis. Normal heart size. No pericardial effusion. Prominent aortic valvular calcification.  Mediastinum/Nodes: No enlarged axillary, mediastinal, or hilar lymph nodes within limitations of noncontrast technique. Grossly unremarkable thyroid within limitations of streak artifact from right shoulder arthroplasty. Unremarkable esophagus.  Lungs/Pleura: Trace left pleural effusion. No pneumothorax. Mild centrilobular emphysema and bronchial wall thickening. Mild bibasilar scarring or fibrosis. Unchanged 5 mm basilar left lower lobe nodule (series 3, image 123). There  is a small amount of subpleural reticulation and ground-glass density in the right upper lobe in the location of the 8 x 6 mm solid nodule on the prior CTA, however no significant residual solid nodule remains (series 3, image 21 and series 6, image 54). No new suspicious nodule is identified.  Upper Abdomen: Atherosclerosis. Partially visualized parapelvic cysts in both kidneys.  Musculoskeletal: Right shoulder arthroplasty. Old lateral left seventh and eighth rib fractures. No suspicious osseous lesion.  IMPRESSION: 1. Interval resolution of right upper lobe nodule consistent with an inflammatory etiology. No additional follow-up needed. 2. No new lung nodule or acute abnormality in the chest. 3. Aortic Atherosclerosis (ICD10-I70.0) and Emphysema (ICD10-J43.9).   Electronically Signed   By: Logan Bores M.D.   On: 03/17/2019 09:26   STS Risk Score:  Procedure: Isolated AVR  Risk of Mortality: 5.673%  Renal Failure: 1.734%  Permanent Stroke: 2.140%  Prolonged Ventilation: 10.176%  DSW Infection: 0.101%  Reoperation: 3.740%  Morbidity or  Mortality: 15.411%  Short Length of Stay: 17.739%  Long Length of Stay: 9.374%   Impression:  This 85 year old woman has stage D, critical, symptomatic aortic stenosis with New York Heart Association class III symptoms of exertional fatigue and shortness of breath consistent with chronic diastolic congestive heart failure.  I have personally reviewed her 2D echocardiogram, cardiac catheterization, and CTA studies.  Her recent echocardiogram shows a severely calcified aortic valve with poor leaflet mobility and an increase in the mean gradient to 53 mmHg consistent with critical aortic stenosis.  Her left ventricular systolic function has remained normal.  Previous cardiac catheterization had shown a calcified 80% LAD stenosis which was successfully treated with atherectomy and stenting.  She remained on Plavix until recently.  She was diagnosed with a pulmonary embolism and DVT and was treated with Eliquis for the past 8 months.  Although she is 15 with severe COPD she was fairly functional until the past several months and has been mainly limited by exertional fatigue and shortness of breath.  I think transcatheter aortic valve replacement is a reasonable option for trying to improve her symptoms and quality of life.  Her gated cardiac CTA shows anatomy suitable for transcatheter aortic valve replacement.  Her abdominal and pelvic CTA shows adequate pelvic vascular anatomy to allow transfemoral insertion.  The patient and her daughter were counseled at length regarding treatment alternatives for management of severe symptomatic aortic stenosis. The risks and benefits of surgical intervention has been discussed in detail. Long-term prognosis with medical therapy was discussed. Alternative approaches such as conventional surgical aortic valve replacement, transcatheter aortic valve replacement, and palliative medical therapy were compared and contrasted at length. This discussion was placed in the context of  the patient's own specific clinical presentation and past medical history. All of their questions have been addressed.   Following the decision to proceed with transcatheter aortic valve replacement, a discussion was held regarding what types of management strategies would be attempted intraoperatively in the event of life-threatening complications, including whether or not the patient would be considered a candidate for the use of cardiopulmonary bypass and/or conversion to open sternotomy for attempted surgical intervention.  At the age of 25 I would not consider her a candidate for emergent sternotomy to manage any intraoperative complications and the patient and her family are aware of that.  The patient has been advised of a variety of complications that might develop including but not limited to risks of death, stroke, paravalvular leak, aortic dissection or  other major vascular complications, aortic annulus rupture, device embolization, cardiac rupture or perforation, mitral regurgitation, acute myocardial infarction, arrhythmia, heart block or bradycardia requiring permanent pacemaker placement, congestive heart failure, respiratory failure, renal failure, pneumonia, infection, other late complications related to structural valve deterioration or migration, or other complications that might ultimately cause a temporary or permanent loss of functional independence or other long term morbidity. The patient provides full informed consent for the procedure as described and all questions were answered.     Plan:  We will proceed with transfemoral transcatheter aortic valve replacement on Tuesday, March 29, 2019.  I instructed the patient's daughter to discontinue Eliquis after the dose tomorrow.  She has had 8 months of treatment for her DVT and PE and I do not think that bridging with heparin or Lovenox is necessary.   I spent 60 minutes performing this consultation and > 50% of this time was spent  face to face counseling and coordinating the care of this patient's severe symptomatic aortic stenosis.   Gaye Pollack, MD 03/22/2019 4:21 PM

## 2019-03-23 ENCOUNTER — Telehealth: Payer: Self-pay | Admitting: Family Medicine

## 2019-03-23 ENCOUNTER — Other Ambulatory Visit: Payer: Self-pay

## 2019-03-23 ENCOUNTER — Telehealth: Payer: Self-pay

## 2019-03-23 DIAGNOSIS — I350 Nonrheumatic aortic (valve) stenosis: Secondary | ICD-10-CM

## 2019-03-23 MED ORDER — PREDNISONE 50 MG PO TABS: ORAL_TABLET | ORAL | 0 refills | Status: DC

## 2019-03-23 NOTE — Telephone Encounter (Signed)
Copied from [place] [id number]. Topic: Quick Communication - Rx Refill/Question >> Mar 23, 2019 11:44 AM Erick Blinks wrote: Medication: ALPRAZolam Duanne Moron) 0.5 MG tablet -pt's daughter called to report that pt is having anxiety complications. Uses med three times a day  Has the patient contacted their pharmacy? No (Agent: If no, request that the patient contact the pharmacy for the refill.) (Agent: If yes, when and what did the pharmacy advise?)  Preferred Pharmacy (with phone number or street name): [facility] [place] [name] [facility] [place] [address] [place] [address] Phone: [phone] Fax: [phone]    Agent: Please be advised that RX refills may take up to 3 business days. We ask that you follow-up with your pharmacy.

## 2019-03-23 NOTE — Telephone Encounter (Signed)
Message sent to Dr. Jordan for review and approval. 

## 2019-03-23 NOTE — Telephone Encounter (Signed)
Called and spoke to pt's daughter, [name] ([name]).  [name] stated that pt's oxygen dropped to 69% on room air this morning with exertion. Pt recovered to 94-95% with 2L/ Per [name], pt is not currently experiencing any symptoms. I have advised pt to wear oxygen at all times as ordered.  Advised [name] to take pt to ED if her oxygen drops in the 60's again. [name] voiced her understanding.    Will route to DS as an FYI.

## 2019-03-23 NOTE — Telephone Encounter (Signed)
Patient is a Public relations account executive patient. Routing to [employer].

## 2019-03-23 NOTE — Telephone Encounter (Signed)
Copied from [place] [id number]. Topic: Quick Communication - See Telephone Encounter >> Mar 23, 2019 11:41 AM Erick Blinks wrote: CRM for notification. See Telephone encounter for: 03/23/19. Pt's daughter called to report that pt has aorta valve replacement surgery on Tuesday. Oxygen levels are low and pt is on oxygen in both the house and car. Level reported this morning at 67.

## 2019-03-23 NOTE — Telephone Encounter (Signed)
Per chart pt's oxygen is managed by Pulmonary. Routing message to their office.

## 2019-03-23 NOTE — Telephone Encounter (Signed)
Left message for pt's daughter, [name] on mobile number listed.  ATC to call home number on file- unable to leave voicemail, due to memory being full.

## 2019-03-24 NOTE — Pre-Procedure Instructions (Signed)
[name]  03/24/2019     [facility] [name], [name] [name] [name] [address] Phone: [phone] Fax: [phone]    Your procedure is scheduled on Tuesday, June 2nd  Report to [facility] Entrance A at 10:15 A,M.  Call this number if you have problems the morning of surgery:  [phone]   Remember:  Do not eat or drink after midnight.    Take these medicines the morning of surgery with A SIP OF WATER  arformoterol (BROVANA) budesonide (PULMICORT) fluticasone (FLONASE)   If needed - albuterol (PROVENTIL),  arformoterol (BROVANA),   Bring inhalers with you the day of surgery   Follow your surgeon's instructions on when to stop Aspirin.  If no instructions were given by your surgeon then you will need to call the office to get those instructions.    7 days prior to surgery STOP taking any Aspirin (unless otherwise instructed by your surgeon), Aleve, Naproxen, Ibuprofen, Motrin, Advil, Goody's, BC's, all herbal medications, fish oil, and all vitamins.   Do not wear jewelry, make-up or nail polish.  Do not wear lotions, powders, or perfumes, or deodorant.  Do not shave 48 hours prior to surgery.   Do not bring valuables to the hospital.  [facility] is not responsible for any belongings or valuables.  Contacts, dentures or bridgework may not be worn into surgery.  Leave your suitcase in the car.  After surgery it may be brought to your room.  For patients admitted to the hospital, discharge time will be determined by your treatment team.  Patients discharged the day of surgery will not be allowed to drive home.   Special instructions:   [place]- Preparing For Surgery  Before surgery, you can play an important role. Because skin is not sterile, your skin needs to be as free of germs as possible. You can reduce the number of germs on your skin by washing with CHG (chlorahexidine gluconate) Soap before  surgery.  CHG is an antiseptic cleaner which kills germs and bonds with the skin to continue killing germs even after washing.    Oral Hygiene is also important to reduce your risk of infection.  Remember - BRUSH YOUR TEETH THE MORNING OF SURGERY WITH YOUR REGULAR TOOTHPASTE  Please do not use if you have an allergy to CHG or antibacterial soaps. If your skin becomes reddened/irritated stop using the CHG.  Do not shave (including legs and underarms) for at least 48 hours prior to first CHG shower. It is OK to shave your face.  Please follow these instructions carefully.   1. Shower the NIGHT BEFORE SURGERY and the MORNING OF SURGERY with CHG.   2. If you chose to wash your hair, wash your hair first as usual with your normal shampoo.  3. After you shampoo, rinse your hair and body thoroughly to remove the shampoo.  4. Use CHG as you would any other liquid soap. You can apply CHG directly to the skin and wash gently with a scrungie or a clean washcloth.   5. Apply the CHG Soap to your body ONLY FROM THE NECK DOWN.  Do not use on open wounds or open sores. Avoid contact with your eyes, ears, mouth and genitals (private parts). Wash Face and genitals (private parts)  with your normal soap.  6. Wash thoroughly, paying special attention to the area where your surgery will be performed.  7. Thoroughly rinse your body with  warm water from the neck down.  8. DO NOT shower/wash with your normal soap after using and rinsing off the CHG Soap.  9. Pat yourself dry with a CLEAN TOWEL.  10. Wear CLEAN PAJAMAS to bed the night before surgery, wear comfortable clothes the morning of surgery  11. Place CLEAN SHEETS on your bed the night of your first shower and DO NOT SLEEP WITH PETS.  Day of Surgery:  Do not apply any deodorants/lotions.  Please wear clean clothes to the hospital/surgery center.   Remember to brush your teeth WITH YOUR REGULAR TOOTHPASTE.  Please read over the following fact  sheets that you were given. Pain Booklet, Coughing and Deep Breathing, MRSA Information and Surgical Site Infection Prevention

## 2019-03-25 ENCOUNTER — Other Ambulatory Visit: Payer: Self-pay

## 2019-03-25 ENCOUNTER — Encounter (HOSPITAL_COMMUNITY)
Admission: RE | Admit: 2019-03-25 | Discharge: 2019-03-25 | Disposition: A | Discharge status: Home / Self Care | Payer: Medicare Other | Source: Ambulatory Visit | Attending: Cardiovascular Disease | Admitting: Cardiovascular Disease

## 2019-03-25 ENCOUNTER — Encounter (HOSPITAL_COMMUNITY): Payer: Self-pay

## 2019-03-25 ENCOUNTER — Other Ambulatory Visit (HOSPITAL_COMMUNITY)
Admission: RE | Admit: 2019-03-25 | Discharge: 2019-03-25 | Disposition: A | Discharge status: Home / Self Care | Payer: Medicare Other | Source: Ambulatory Visit | Attending: Cardiovascular Disease | Admitting: Cardiovascular Disease

## 2019-03-25 ENCOUNTER — Ambulatory Visit (HOSPITAL_COMMUNITY): Admission: RE | Admit: 2019-03-25 | Payer: Medicare Other | Source: Ambulatory Visit

## 2019-03-25 DIAGNOSIS — Z7952 Long term (current) use of systemic steroids: Secondary | ICD-10-CM | POA: Diagnosis not present

## 2019-03-25 DIAGNOSIS — I350 Nonrheumatic aortic (valve) stenosis: Secondary | ICD-10-CM | POA: Insufficient documentation

## 2019-03-25 DIAGNOSIS — Z96611 Presence of right artificial shoulder joint: Secondary | ICD-10-CM | POA: Insufficient documentation

## 2019-03-25 DIAGNOSIS — E785 Hyperlipidemia, unspecified: Secondary | ICD-10-CM | POA: Insufficient documentation

## 2019-03-25 DIAGNOSIS — F419 Anxiety disorder, unspecified: Secondary | ICD-10-CM | POA: Insufficient documentation

## 2019-03-25 DIAGNOSIS — R0602 Shortness of breath: Secondary | ICD-10-CM | POA: Diagnosis not present

## 2019-03-25 DIAGNOSIS — I2510 Atherosclerotic heart disease of native coronary artery without angina pectoris: Secondary | ICD-10-CM | POA: Insufficient documentation

## 2019-03-25 DIAGNOSIS — M797 Fibromyalgia: Secondary | ICD-10-CM | POA: Diagnosis not present

## 2019-03-25 DIAGNOSIS — Z955 Presence of coronary angioplasty implant and graft: Secondary | ICD-10-CM | POA: Insufficient documentation

## 2019-03-25 DIAGNOSIS — Z79899 Other long term (current) drug therapy: Secondary | ICD-10-CM | POA: Diagnosis not present

## 2019-03-25 DIAGNOSIS — I5032 Chronic diastolic (congestive) heart failure: Secondary | ICD-10-CM | POA: Insufficient documentation

## 2019-03-25 DIAGNOSIS — Z7951 Long term (current) use of inhaled steroids: Secondary | ICD-10-CM | POA: Diagnosis not present

## 2019-03-25 DIAGNOSIS — Z7901 Long term (current) use of anticoagulants: Secondary | ICD-10-CM | POA: Diagnosis not present

## 2019-03-25 DIAGNOSIS — R05 Cough: Secondary | ICD-10-CM | POA: Diagnosis not present

## 2019-03-25 DIAGNOSIS — J439 Emphysema, unspecified: Secondary | ICD-10-CM | POA: Diagnosis not present

## 2019-03-25 DIAGNOSIS — Z1159 Encounter for screening for other viral diseases: Secondary | ICD-10-CM | POA: Diagnosis not present

## 2019-03-25 DIAGNOSIS — Z01818 Encounter for other preprocedural examination: Secondary | ICD-10-CM | POA: Insufficient documentation

## 2019-03-25 HISTORY — DX: Pneumonia, unspecified organism: J18.9

## 2019-03-25 LAB — CBC
HCT: 32 % — ABNORMAL LOW (ref 36.0–46.0)
Hemoglobin: 9 g/dL — ABNORMAL LOW (ref 12.0–15.0)
MCH: 26.8 pg (ref 26.0–34.0)
MCHC: 28.1 g/dL — ABNORMAL LOW (ref 30.0–36.0)
MCV: 95.2 fL (ref 80.0–100.0)
Platelets: 268 10*3/uL (ref 150–400)
RBC: 3.36 MIL/uL — ABNORMAL LOW (ref 3.87–5.11)
RDW: 17.1 % — ABNORMAL HIGH (ref 11.5–15.5)
WBC: 6.6 10*3/uL (ref 4.0–10.5)
nRBC: 0 % (ref 0.0–0.2)

## 2019-03-25 LAB — URINALYSIS, ROUTINE W REFLEX MICROSCOPIC
Bilirubin Urine: NEGATIVE
Glucose, UA: NEGATIVE mg/dL
Hgb urine dipstick: NEGATIVE
Ketones, ur: NEGATIVE mg/dL
Leukocytes,Ua: NEGATIVE
Nitrite: NEGATIVE
Protein, ur: NEGATIVE mg/dL
Specific Gravity, Urine: 1.012 (ref 1.005–1.030)
pH: 6 (ref 5.0–8.0)

## 2019-03-25 LAB — BLOOD GAS, ARTERIAL
Acid-Base Excess: 7.2 mmol/L — ABNORMAL HIGH (ref 0.0–2.0)
Bicarbonate: 32.7 mmol/L — ABNORMAL HIGH (ref 20.0–28.0)
Drawn by: 265211
O2 Content: 4 L/min
O2 Saturation: 99.1 %
Patient temperature: 98.6
pCO2 arterial: 60.5 mmHg — ABNORMAL HIGH (ref 32.0–48.0)
pH, Arterial: 7.352 (ref 7.350–7.450)
pO2, Arterial: 165 mmHg — ABNORMAL HIGH (ref 83.0–108.0)

## 2019-03-25 LAB — TYPE AND SCREEN
ABO/RH(D): A POS
Antibody Screen: NEGATIVE

## 2019-03-25 LAB — COMPREHENSIVE METABOLIC PANEL
ALT: 15 U/L (ref 0–44)
AST: 20 U/L (ref 15–41)
Albumin: 3.2 g/dL — ABNORMAL LOW (ref 3.5–5.0)
Alkaline Phosphatase: 96 U/L (ref 38–126)
Anion gap: 10 (ref 5–15)
BUN: 8 mg/dL (ref 8–23)
CO2: 27 mmol/L (ref 22–32)
Calcium: 9 mg/dL (ref 8.9–10.3)
Chloride: 104 mmol/L (ref 98–111)
Creatinine, Ser: 0.79 mg/dL (ref 0.44–1.00)
GFR calc Af Amer: >60 mL/min (ref >60)
GFR calc non Af Amer: >60 mL/min (ref >60)
Glucose, Bld: 102 mg/dL — ABNORMAL HIGH (ref 70–99)
Potassium: 4.2 mmol/L (ref 3.5–5.1)
Sodium: 141 mmol/L (ref 135–145)
Total Bilirubin: 0.7 mg/dL (ref 0.3–1.2)
Total Protein: 6 g/dL — ABNORMAL LOW (ref 6.5–8.1)

## 2019-03-25 LAB — HEMOGLOBIN A1C
Hgb A1c MFr Bld: 5.3 % (ref 4.8–5.6)
Mean Plasma Glucose: 105.41 mg/dL

## 2019-03-25 LAB — APTT: aPTT: 27 seconds (ref 24–36)

## 2019-03-25 LAB — PROTIME-INR
INR: 1.1 (ref 0.8–1.2)
Prothrombin Time: 14.4 seconds (ref 11.4–15.2)

## 2019-03-25 LAB — SURGICAL PCR SCREEN
MRSA, PCR: NEGATIVE
Staphylococcus aureus: NEGATIVE

## 2019-03-25 LAB — ABO/RH: ABO/RH(D): A POS

## 2019-03-25 LAB — BRAIN NATRIURETIC PEPTIDE: B Natriuretic Peptide: 1175.3 pg/mL — ABNORMAL HIGH (ref 0.0–100.0)

## 2019-03-25 NOTE — Progress Notes (Addendum)
PCP - Dr. [name] Cardiologist - Dr. [name] Pulmonologist  - Dr. Flora Lipps  Chest x-ray - 03/25/2019 EKG - 03/25/2019 Stress Test - 02/26/2016 ECHO - 03/17/2019 Cardiac Cath - 07/23/18  Sleep Study - 03/12/2016 CPAP -  denies  Blood Thinner Instructions: Eliquis LD 03/24/2019 Aspirin Instructions: N/A  Anesthesia review: YES, COPD, heart hx  During PAT visit while obtaining vitals, patient O2 dropped to 73%. Patient c/o SOB Patient placed on 2L [place]. O2 came up to 97%.  Per note, patient on 2L at home. Jeneen Rinks PA notified.  Coronavirus Screening  Have you experienced the following symptoms:  Cough yes/no: Yes Fever (>100.84F)  yes/no: No Runny nose yes/no: No Sore throat yes/no: No Difficulty breathing/shortness of breath  yes/no: Yes  Have you or a family member traveled in the last 14 days and where? yes/no: No  If the patient indicates "YES" to the above questions, their PAT will be rescheduled to limit the exposure to others and, the surgeon will be notified. THE PATIENT WILL NEED TO BE ASYMPTOMATIC FOR 14 DAYS.   If the patient is not experiencing any of these symptoms, the PAT nurse will instruct them to NOT bring anyone with them to their appointment since they may have these symptoms or traveled as well.   Please remind your patients and families that hospital visitation restrictions are in effect and the importance of the restrictions.   Patient denies fever and chest pain at PAT appointment  Patient and patient's daughter verbalized understanding of instructions that were given to them at the PAT appointment. Patient was also instructed that they will need to review over the PAT instructions again at home before surgery.

## 2019-03-25 NOTE — Telephone Encounter (Signed)
Noted. No further recs  [name]

## 2019-03-26 LAB — NOVEL CORONAVIRUS, NAA (HOSP ORDER, SEND-OUT TO REF LAB; TAT 18-24 HRS): SARS-CoV-2, NAA: NOT DETECTED

## 2019-03-28 MED ORDER — DEXMEDETOMIDINE HCL IN NACL 400 MCG/100ML IV SOLN
0.1000 ug/kg/h | INTRAVENOUS | Status: AC
  Administered 2019-03-29: 1 ug/kg/h via INTRAVENOUS
  Filled 2019-03-28: qty 100

## 2019-03-28 MED ORDER — SODIUM CHLORIDE 0.9 % IV SOLN
INTRAVENOUS | Status: DC
  Filled 2019-03-28: qty 30

## 2019-03-28 MED ORDER — POTASSIUM CHLORIDE 2 MEQ/ML IV SOLN
80.0000 meq | INTRAVENOUS | Status: DC
  Filled 2019-03-28: qty 40

## 2019-03-28 MED ORDER — VANCOMYCIN HCL 10 G IV SOLR
1250.0000 mg | INTRAVENOUS | Status: AC
  Administered 2019-03-29: 1250 mg via INTRAVENOUS
  Filled 2019-03-28: qty 1250

## 2019-03-28 MED ORDER — MAGNESIUM SULFATE 50 % IJ SOLN
40.0000 meq | INTRAMUSCULAR | Status: DC
  Filled 2019-03-28: qty 9.85

## 2019-03-28 MED ORDER — SODIUM CHLORIDE 0.9 % IV SOLN
1.5000 g | INTRAVENOUS | Status: AC
  Administered 2019-03-29: 1.5 g via INTRAVENOUS
  Filled 2019-03-28 (×2): qty 1.5

## 2019-03-28 MED ORDER — NOREPINEPHRINE BITARTRATE 1 MG/ML IV SOLN
0.0000 ug/min | INTRAVENOUS | Status: AC
  Administered 2019-03-29: 1 ug/min via INTRAVENOUS
  Filled 2019-03-28: qty 4

## 2019-03-28 NOTE — Progress Notes (Signed)
Anesthesia Chart Review:  Case:  [id number] Date/Time:  03/29/19 1200   Procedures:      TRANSCATHETER AORTIC VALVE REPLACEMENT, TRANSFEMORAL (N/A Chest)     TRANSESOPHAGEAL ECHOCARDIOGRAM (TEE) (N/A )   Anesthesia type:  General   Pre-op diagnosis:  Severe Aortic Stenosis   Location:  [facility] ROOM 16 / [facility]   Surgeon:  Burnell Blanks, MD      DISCUSSION: Patient is a 85 year old female scheduled for the above procedure.  History includes former smoker (quit 1993), severe COPD (GOLD stage D last hospitalized 11/2018), chronic diastolic CHF (last hospitalized 11/2018), severe AS, CAD (s/p atherectomy/DES pLAD 07/23/18), RLE DVT (08/25/18), right PE (08/24/18), fibromyalgia, memory loss, dyspnea, low back pain syndrome, HLD, congenital nystagmus, pancreatic lesion (cystic pancreatic head lesion, increased in size since 2011 on 08/24/18 CT. 6-12 month F/U rec).  Of note, notes indicate that patient's daughter contacted pulmonologist regarding AV surgery plans and that O2 sat of 69% on RA after walking on 03/23/19. She was advised to wear continuous home O2 and go to ED if O2 sats drop in the 60's again. Pulmonologist is working arranging portable O2. Notes also indicate that RA sat at PAT was 73%, improved to 97% on 2L/[place].   Last Eliquis 03/24/19. HGB 9.0, down from 10.7 12/29/2018. Dr. [name] has ordered a repeat CBC for the morning of surgery. BNP is >1100 and pCO2 60.5 forwarded to Nell Range, PA-C.  Negative presurgical COVID test on 03/25/19.  Anesthesia team to evaluate on the day of surgery.    VS: BP 108/69   Pulse 99   Temp (!) 36.3 C   Resp (!) 22   Ht 5\' 4"  (1.626 m)   Wt 60.8 kg   SpO2 96%   BMI 23.00 kg/m    PROVIDERS: [name], [name], MD is PCP [name], MD is cardiologist Maryruth Bun, MD is pulmonologist. Last visit with Ashby Dawes, Deep, MD. He ordered portable O2, but warned that "she appears to be at high risk of further decline, and recommended that  they consider a hospice/palliative care. They would like to hold off and discuss further with the heart doctor."    LABS: Preoperative labs noted. H/H 9.0/32.0, down from 10.7/36.0 on 12/29/18. BNP elevatead at 1175. PCO2 on ABG high at 60.  (all labs ordered are listed, but only abnormal results are displayed)  Labs Reviewed  BLOOD GAS, ARTERIAL - Abnormal; Notable for the following components:      Result Value   pCO2 arterial 60.5 (*)    pO2, Arterial 165 (*)    Bicarbonate 32.7 (*)    Acid-Base Excess 7.2 (*)    All other components within normal limits  BRAIN NATRIURETIC PEPTIDE - Abnormal; Notable for the following components:   B Natriuretic Peptide 1,175.3 (*)    All other components within normal limits  CBC - Abnormal; Notable for the following components:   RBC 3.36 (*)    Hemoglobin 9.0 (*)    HCT 32.0 (*)    MCHC 28.1 (*)    RDW 17.1 (*)    All other components within normal limits  COMPREHENSIVE METABOLIC PANEL - Abnormal; Notable for the following components:   Glucose, Bld 102 (*)    Total Protein 6.0 (*)    Albumin 3.2 (*)    All other components within normal limits  SURGICAL PCR SCREEN  APTT  HEMOGLOBIN A1C  PROTIME-INR  URINALYSIS, ROUTINE W REFLEX MICROSCOPIC  TYPE AND SCREEN  ABO/RH    PFTs 07/26/18:  PRE: FVC 1.69 (84%). FEV1 0.83 (56%) POST: FVC 1.98 (98%). FEV1 0.98 (67%).  DLCO unc 8.30 (36%)   IMAGES: CXR 03/25/19: IMPRESSION: Mild cardiomegaly. Small bilateral effusions with bibasilar atelectasis.  CT chest 03/16/19: IMPRESSION: 1. Interval resolution of right upper lobe nodule consistent with an inflammatory etiology. No additional follow-up needed. 2. No new lung nodule or acute abnormality in the chest. 3. Aortic Atherosclerosis (ICD10-I70.0) and Emphysema (ICD10-J43.9).  CTA chest/abd/pelvis 08/24/18: IMPRESSION: 1. Incidental acute pulmonary emboli within the lobar and segmental branches of the right upper and right middle  lobes. 2. Vascular findings and measurements pertinent to potential TAVR procedure, as detailed above. 3. Severe thickening and calcification of the aortic valve, compatible with the reported clinical history of severe aortic stenosis. 4. Aortic Atherosclerosis (ICD10-I70.0) and Emphysema (ICD10-J43.9). 5. Two-vessel coronary atherosclerosis. 6. Posterior apical right upper lobe 8 x 6 mm solid pulmonary nodule appears slightly increased in size since 2016 chest CT, potentially artifactual due to technical scan differences. Recommend attention on follow-up chest CT in 3-6 months. 7. Cystic 2.3 cm pancreatic head lesion is increased in size since 2011 CT abdomen study. No biliary or pancreatic duct dilation or other high risk features on this routine CT study. MRI abdomen without and with IV contrast is indicated for further characterization, and may be performed as clinically warranted. Alternatively, follow-up pancreas protocol CT abdomen without and with IV contrast may be obtained in 6-12 months. 8. Marked sigmoid diverticulosis.   EKG: 03/25/19: Sinus tachycardia with Premature atrial complexes Left atrial enlargement Anteroseptal infarct , age undetermined Since previous tracing 12/14/18 no change Confirmed by Levin Erp [phone]) on 03/25/2019 3:15:05 PM   CV: Echo 03/17/19: IMPRESSIONS  1. The left ventricle has normal systolic function with an ejection fraction of 60-65%. The cavity size was mildly dilated. Left ventricular diastolic Doppler parameters are indeterminate.  2. The right ventricle has normal systolic function. The cavity was normal. There is no increase in right ventricular wall thickness.  3. The mitral valve is abnormal. Mild thickening of the mitral valve leaflet. Moderate calcification of the mitral valve leaflet. There is moderate mitral annular calcification present.  4. The tricuspid valve is grossly normal.  5. The aortic valve is abnormal. Severely  thickening of the aortic valve. Severe calcifcation of the aortic valve. Severe stenosis of the aortic valve. AV Area (Vmax):   0.68 cm     +------------------+------------++ AV Area (Vmean):  0.58 cm     +------------------+------------++ AV Area (VTI):    0.85 cm     +------------------+------------++ AV Vmax:          428.00 cm/s  +------------------+------------++ AV Vmean:         350.000 cm/s +------------------+------------++ AV VTI:           0.887 m      +------------------+------------++ AV Peak Grad:     73.3 mmHg    +------------------+------------++ AV Mean Grad:     53.0 mmHg    +------------------+------------++ LVOT Vmax:        84.20 cm/s   +------------------+------------++ LVOT Vmean:       59.100 cm/s  +------------------+------------++ LVOT VTI:         0.218 m      +------------------+------------++ LVOT/AV VTI ratio:0.25          6. The aortic root is normal in size and structure.  7. The interatrial septum was not assessed.  CT coronary 08/24/18: IMPRESSION: 1. Tri  leaflet AV with annular area of 436 mm2 suitable for a 26 mm Sapien 3 valve 2.  Normal aortic root 3.5 cm 3. Optimum angiographic angle for deployment LAO 14 Caudal 19 degrees 4.  Coronary arteries sufficient height above annulus for deployment 5.  No LAA thrombus  Cardiac cath 07/07/18:  Prox LAD lesion is 80% stenosed.  Ost 1st Mrg to 1st Mrg lesion is 40% stenosed. 1. Severe, heavily calcified (80%) stenosis in the proximal LAD just before the takeoff the moderate caliber diagonal branch.  2. Mild non-obstructive disease in the Circumflex arter 3. The RCA is a large caliber vessel with no obstructive disease 4. Severe aortic valve stenosis (peak to peak gradient 44 mmHg, mean gradient 42.1 mmHg, AVA 1.41 cm2) PCI 07/23/18:  1. Severe stenosis proximal to mid LAD 2. Successful PTCA/orbital atherectomy/DES x 1 proximal to mid  LAD Recommendations: Continue DAPT with ASA and Plavix for at least six months but 12 months if possible.  Recommend uninterrupted dual antiplatelet therapy with Aspirin 81mg  daily and Clopidogrel 75mg  daily for a minimum of 6 months (stable ischemic heart disease - Class I recommendation).  Carotid US 07/26/18: Final Interpretation: Right Carotid: Velocities in the right ICA are consistent with a 1-39% stenosis. Left Carotid: Velocities in the left ICA are consistent with a 1-39% stenosis. Vertebrals: Bilateral vertebral arteries demonstrate antegrade flow.  BLE venous US 08/25/18: Summary: Right: Findings consistent with acute deep vein thrombosis involving the right femoral vein, right popliteal vein, right posterior tibial vein, and right peroneal vein. No cystic structure found in the popliteal fossa. Pulsatile CFV suggestive of  elevated right sided heart pressure Left: There is no evidence of deep vein thrombosis in the lower extremity. No cystic structure found in the popliteal fossa.   Past Medical History:  Diagnosis Date  . Anxiety   . congenital nystagmus   . COPD (chronic obstructive pulmonary disease) ([place])   . Coronary artery disease   . DJD (degenerative joint disease)   . Dyspnea   . Fibromyalgia   . Low back pain syndrome   . Lung nodules   . Memory loss   . Other and unspecified hyperlipidemia   . Pancreatic lesion   . Pneumonia   . Severe aortic stenosis   . Venous insufficiency     Past Surgical History:  Procedure Laterality Date  . ABDOMINAL HYSTERECTOMY    . ANTERIOR CERVICAL DISCECTOMY    . APPENDECTOMY    . CARPAL TUNNEL RELEASE  12/2011   right arm  . CATARACT EXTRACTION    . CORONARY ATHERECTOMY  07/23/2018   PTCA/orbital atherectomy/DES x 1 proximal to mid LAD  . CORONARY ATHERECTOMY N/A 07/23/2018   Procedure: CORONARY ATHERECTOMY;  Surgeon: Burnell Blanks, MD;  Location: [place] [facility];  Service: Cardiovascular;  Laterality:  N/A;  . CORONARY STENT INTERVENTION    . CORONARY STENT INTERVENTION N/A 07/23/2018   Procedure: CORONARY STENT INTERVENTION;  Surgeon: Burnell Blanks, MD;  Location: [place] [facility];  Service: Cardiovascular;  Laterality: N/A;  . EYE SURGERY    . LUMBAR LAMINECTOMY    . right knee arthroscopy    . right shoulder replacement  04/2009  . right shoulder surgery  2008   Dr. Noemi Chapel  . RIGHT/LEFT HEART CATH AND CORONARY ANGIOGRAPHY N/A 07/07/2018   Procedure: RIGHT/LEFT HEART CATH AND CORONARY ANGIOGRAPHY;  Surgeon: Burnell Blanks, MD;  Location: [place] [facility];  Service: Cardiovascular;  Laterality: N/A;  . ULTRASOUND  GUIDANCE FOR VASCULAR ACCESS  07/07/2018   Procedure: Ultrasound Guidance For Vascular Access;  Surgeon: Burnell Blanks, MD;  Location: [place] [facility];  Service: Cardiovascular;;    MEDICATIONS: . albuterol (PROVENTIL) (2.5 MG/3ML) 0.083% nebulizer solution  . ALPRAZolam (XANAX) 0.5 MG tablet  . AMBULATORY NON FORMULARY MEDICATION  . arformoterol (BROVANA) 15 MCG/2ML NEBU  . benzonatate (TESSALON) 200 MG capsule  . budesonide (PULMICORT) 0.5 MG/2ML nebulizer solution  . ELIQUIS 5 MG TABS tablet  . escitalopram (LEXAPRO) 20 MG tablet  . fluticasone (FLONASE) 50 MCG/ACT nasal spray  . guaiFENesin (MUCINEX) 600 MG 12 hr tablet  . loratadine (CLARITIN) 10 MG tablet  . metoprolol tartrate (LOPRESSOR) 25 MG tablet  . pantoprazole (PROTONIX) 40 MG tablet  . predniSONE (DELTASONE) 50 MG tablet  . Respiratory Therapy Supplies (FLUTTER) DEVI  . simvastatin (ZOCOR) 20 MG tablet  . traZODone (DESYREL) 50 MG tablet  . triamcinolone cream (KENALOG) 0.1 %  . VENTOLIN HFA 108 (90 Base) MCG/ACT inhaler   No current facility-administered medications for this encounter.     Myra Gianotti, PA-C Surgical Short Stay/Anesthesiology [facility] Phone [phone] [facility] Phone [phone] 03/28/2019 11:53 AM

## 2019-03-28 NOTE — Anesthesia Preprocedure Evaluation (Addendum)
Anesthesia Evaluation  Patient identified by MRN, date of birth, ID band Patient awake    Reviewed: Allergy & Precautions, NPO status , Patient's Chart, lab work & pertinent test results  Airway Mallampati: I  TM Distance: >3 FB Neck ROM: Full    Dental   Pulmonary COPD, former smoker,    Pulmonary exam normal        Cardiovascular + CAD  Normal cardiovascular exam     Neuro/Psych Anxiety Depression    GI/Hepatic   Endo/Other    Renal/GU      Musculoskeletal   Abdominal   Peds  Hematology   Anesthesia Other Findings   Reproductive/Obstetrics                            Anesthesia Physical Anesthesia Plan  ASA: III  Anesthesia Plan: MAC   Post-op Pain Management:    Induction: Intravenous  PONV Risk Score and Plan: 2  Airway Management Planned: Simple Face Mask  Additional Equipment:   Intra-op Plan:   Post-operative Plan:   Informed Consent: I have reviewed the patients History and Physical, chart, labs and discussed the procedure including the risks, benefits and alternatives for the proposed anesthesia with the patient or authorized representative who has indicated his/her understanding and acceptance.       Plan Discussed with: CRNA and Surgeon  Anesthesia Plan Comments: (PAT note written 03/28/2019 by Myra Gianotti, PA-C. )       Anesthesia Quick Evaluation

## 2019-03-29 ENCOUNTER — Inpatient Hospital Stay (HOSPITAL_COMMUNITY)
Admission: RE | Admit: 2019-03-29 | Discharge: 2019-04-01 | DRG: 266 | Disposition: A | Discharge status: Home / Self Care | Payer: Medicare Other | Attending: Cardiovascular Disease | Admitting: Cardiovascular Disease

## 2019-03-29 ENCOUNTER — Inpatient Hospital Stay (HOSPITAL_COMMUNITY): Payer: Medicare Other | Admitting: Physician Assistant

## 2019-03-29 ENCOUNTER — Other Ambulatory Visit: Payer: Self-pay

## 2019-03-29 ENCOUNTER — Inpatient Hospital Stay (HOSPITAL_COMMUNITY): Payer: Medicare Other | Admitting: Certified Registered Nurse Anesthetist

## 2019-03-29 ENCOUNTER — Encounter (HOSPITAL_COMMUNITY): Payer: Self-pay

## 2019-03-29 ENCOUNTER — Encounter (HOSPITAL_COMMUNITY)
Admission: RE | Disposition: A | Discharge status: Home / Self Care | Payer: Self-pay | Source: Home / Self Care | Attending: Cardiovascular Disease

## 2019-03-29 ENCOUNTER — Inpatient Hospital Stay (HOSPITAL_COMMUNITY): Payer: Medicare Other

## 2019-03-29 DIAGNOSIS — I5033 Acute on chronic diastolic (congestive) heart failure: Secondary | ICD-10-CM | POA: Diagnosis not present

## 2019-03-29 DIAGNOSIS — E785 Hyperlipidemia, unspecified: Secondary | ICD-10-CM | POA: Diagnosis present

## 2019-03-29 DIAGNOSIS — I350 Nonrheumatic aortic (valve) stenosis: Secondary | ICD-10-CM

## 2019-03-29 DIAGNOSIS — Z86718 Personal history of other venous thrombosis and embolism: Secondary | ICD-10-CM | POA: Diagnosis not present

## 2019-03-29 DIAGNOSIS — D509 Iron deficiency anemia, unspecified: Secondary | ICD-10-CM | POA: Diagnosis present

## 2019-03-29 DIAGNOSIS — Z955 Presence of coronary angioplasty implant and graft: Secondary | ICD-10-CM | POA: Diagnosis not present

## 2019-03-29 DIAGNOSIS — F419 Anxiety disorder, unspecified: Secondary | ICD-10-CM | POA: Diagnosis not present

## 2019-03-29 DIAGNOSIS — J9610 Chronic respiratory failure, unspecified whether with hypoxia or hypercapnia: Secondary | ICD-10-CM

## 2019-03-29 DIAGNOSIS — M797 Fibromyalgia: Secondary | ICD-10-CM | POA: Diagnosis present

## 2019-03-29 DIAGNOSIS — Z006 Encounter for examination for normal comparison and control in clinical research program: Secondary | ICD-10-CM

## 2019-03-29 DIAGNOSIS — Z952 Presence of prosthetic heart valve: Secondary | ICD-10-CM | POA: Diagnosis not present

## 2019-03-29 DIAGNOSIS — Z7951 Long term (current) use of inhaled steroids: Secondary | ICD-10-CM | POA: Diagnosis not present

## 2019-03-29 DIAGNOSIS — I700 Atherosclerosis of aorta: Secondary | ICD-10-CM | POA: Diagnosis present

## 2019-03-29 DIAGNOSIS — Z7901 Long term (current) use of anticoagulants: Secondary | ICD-10-CM | POA: Diagnosis not present

## 2019-03-29 DIAGNOSIS — Z86711 Personal history of pulmonary embolism: Secondary | ICD-10-CM | POA: Diagnosis not present

## 2019-03-29 DIAGNOSIS — Z91041 Radiographic dye allergy status: Secondary | ICD-10-CM

## 2019-03-29 DIAGNOSIS — J90 Pleural effusion, not elsewhere classified: Secondary | ICD-10-CM | POA: Diagnosis not present

## 2019-03-29 DIAGNOSIS — I472 Ventricular tachycardia: Secondary | ICD-10-CM | POA: Diagnosis not present

## 2019-03-29 DIAGNOSIS — I2510 Atherosclerotic heart disease of native coronary artery without angina pectoris: Secondary | ICD-10-CM | POA: Diagnosis present

## 2019-03-29 DIAGNOSIS — E782 Mixed hyperlipidemia: Secondary | ICD-10-CM | POA: Diagnosis present

## 2019-03-29 DIAGNOSIS — I5032 Chronic diastolic (congestive) heart failure: Secondary | ICD-10-CM | POA: Diagnosis present

## 2019-03-29 DIAGNOSIS — J449 Chronic obstructive pulmonary disease, unspecified: Secondary | ICD-10-CM | POA: Diagnosis present

## 2019-03-29 DIAGNOSIS — J9621 Acute and chronic respiratory failure with hypoxia: Secondary | ICD-10-CM

## 2019-03-29 DIAGNOSIS — J9811 Atelectasis: Secondary | ICD-10-CM | POA: Diagnosis not present

## 2019-03-29 HISTORY — PX: TRANSCATHETER AORTIC VALVE REPLACEMENT, TRANSFEMORAL: SHX6400

## 2019-03-29 HISTORY — PX: TEE WITHOUT CARDIOVERSION: SHX5443

## 2019-03-29 HISTORY — DX: Presence of prosthetic heart valve: Z95.2

## 2019-03-29 LAB — CBC
HCT: 35.2 % — ABNORMAL LOW (ref 36.0–46.0)
Hemoglobin: 10.5 g/dL — ABNORMAL LOW (ref 12.0–15.0)
MCH: 27.1 pg (ref 26.0–34.0)
MCHC: 29.8 g/dL — ABNORMAL LOW (ref 30.0–36.0)
MCV: 91 fL (ref 80.0–100.0)
Platelets: 265 10*3/uL (ref 150–400)
RBC: 3.87 MIL/uL (ref 3.87–5.11)
RDW: 16.3 % — ABNORMAL HIGH (ref 11.5–15.5)
WBC: 5.2 10*3/uL (ref 4.0–10.5)
nRBC: 0 % (ref 0.0–0.2)

## 2019-03-29 LAB — POCT I-STAT 7, (LYTES, BLD GAS, ICA,H+H)
Acid-Base Excess: 3 mmol/L — ABNORMAL HIGH (ref 0.0–2.0)
Bicarbonate: 28.7 mmol/L — ABNORMAL HIGH (ref 20.0–28.0)
Calcium, Ion: 1.27 mmol/L (ref 1.15–1.40)
HCT: 25 % — ABNORMAL LOW (ref 36.0–46.0)
Hemoglobin: 8.5 g/dL — ABNORMAL LOW (ref 12.0–15.0)
O2 Saturation: 94 %
Potassium: 4.7 mmol/L (ref 3.5–5.1)
Sodium: 135 mmol/L (ref 135–145)
TCO2: 30 mmol/L (ref 22–32)
pCO2 arterial: 49.6 mmHg — ABNORMAL HIGH (ref 32.0–48.0)
pH, Arterial: 7.371 (ref 7.350–7.450)
pO2, Arterial: 72 mmHg — ABNORMAL LOW (ref 83.0–108.0)

## 2019-03-29 LAB — POCT I-STAT 4, (NA,K, GLUC, HGB,HCT)
Glucose, Bld: 128 mg/dL — ABNORMAL HIGH (ref 70–99)
Glucose, Bld: 125 mg/dL — ABNORMAL HIGH (ref 70–99)
HCT: 27 % — ABNORMAL LOW (ref 36.0–46.0)
HCT: 26 % — ABNORMAL LOW (ref 36.0–46.0)
Hemoglobin: 9.2 g/dL — ABNORMAL LOW (ref 12.0–15.0)
Hemoglobin: 8.8 g/dL — ABNORMAL LOW (ref 12.0–15.0)
Potassium: 4.7 mmol/L (ref 3.5–5.1)
Potassium: 4.7 mmol/L (ref 3.5–5.1)
Sodium: 135 mmol/L (ref 135–145)
Sodium: 135 mmol/L (ref 135–145)

## 2019-03-29 LAB — POCT I-STAT CREATININE: Creatinine, Ser: 0.7 mg/dL (ref 0.44–1.00)

## 2019-03-29 SURGERY — IMPLANTATION, AORTIC VALVE, TRANSCATHETER, FEMORAL APPROACH
Anesthesia: General | Site: Chest

## 2019-03-29 MED ORDER — MORPHINE SULFATE (PF) 2 MG/ML IV SOLN: 1.0000 mg | INTRAVENOUS | Status: DC | PRN

## 2019-03-29 MED ORDER — ACETAMINOPHEN 325 MG PO TABS
650.0000 mg | ORAL_TABLET | Freq: Four times a day (QID) | ORAL | Status: DC | PRN
  Administered 2019-03-29 – 2019-03-31 (×4): 650 mg via ORAL
  Filled 2019-03-29 (×4): qty 2

## 2019-03-29 MED ORDER — SODIUM CHLORIDE 0.9 % IV SOLN: INTRAVENOUS | Status: DC

## 2019-03-29 MED ORDER — SODIUM CHLORIDE 0.9 % IV SOLN
INTRAVENOUS | Status: DC | PRN
  Administered 2019-03-29: 1500 mL

## 2019-03-29 MED ORDER — CHLORHEXIDINE GLUCONATE 4 % EX LIQD: 30.0000 mL | CUTANEOUS | Status: DC

## 2019-03-29 MED ORDER — PROTAMINE SULFATE 10 MG/ML IV SOLN
INTRAVENOUS | Status: DC | PRN
  Administered 2019-03-29: 10 mg via INTRAVENOUS

## 2019-03-29 MED ORDER — FAMOTIDINE 20 MG PO TABS
20.0000 mg | ORAL_TABLET | Freq: Two times a day (BID) | ORAL | Status: AC
  Administered 2019-03-29 – 2019-03-30 (×2): 20 mg via ORAL
  Filled 2019-03-29 (×2): qty 1

## 2019-03-29 MED ORDER — DIPHENHYDRAMINE HCL 25 MG PO CAPS
50.0000 mg | ORAL_CAPSULE | Freq: Once | ORAL | Status: DC
  Filled 2019-03-29: qty 2

## 2019-03-29 MED ORDER — IODIXANOL 320 MG/ML IV SOLN
INTRAVENOUS | Status: DC | PRN
  Administered 2019-03-29: 40.1 mL via INTRAVENOUS

## 2019-03-29 MED ORDER — TRAZODONE HCL 50 MG PO TABS: 25.0000 mg | ORAL_TABLET | Freq: Every evening | ORAL | Status: DC | PRN

## 2019-03-29 MED ORDER — BUDESONIDE 0.5 MG/2ML IN SUSP
0.5000 mg | Freq: Two times a day (BID) | RESPIRATORY_TRACT | Status: DC
  Administered 2019-03-29 – 2019-04-01 (×6): 0.5 mg via RESPIRATORY_TRACT
  Filled 2019-03-29 (×6): qty 2

## 2019-03-29 MED ORDER — CHLORHEXIDINE GLUCONATE 4 % EX LIQD: 60.0000 mL | Freq: Once | CUTANEOUS | Status: DC

## 2019-03-29 MED ORDER — ACETAMINOPHEN 650 MG RE SUPP: 650.0000 mg | Freq: Four times a day (QID) | RECTAL | Status: DC | PRN

## 2019-03-29 MED ORDER — HEPARIN SODIUM (PORCINE) 1000 UNIT/ML IJ SOLN
INTRAMUSCULAR | Status: DC | PRN
  Administered 2019-03-29: 10000 [IU] via INTRAVENOUS

## 2019-03-29 MED ORDER — DIPHENHYDRAMINE HCL 25 MG PO CAPS
25.0000 mg | ORAL_CAPSULE | Freq: Four times a day (QID) | ORAL | Status: AC
  Administered 2019-03-29 – 2019-03-30 (×3): 25 mg via ORAL
  Filled 2019-03-29 (×3): qty 1

## 2019-03-29 MED ORDER — CHLORHEXIDINE GLUCONATE 0.12 % MT SOLN
15.0000 mL | Freq: Once | OROMUCOSAL | Status: AC
  Administered 2019-03-29: 12:00:00 15 mL via OROMUCOSAL
  Filled 2019-03-29: qty 15

## 2019-03-29 MED ORDER — SODIUM CHLORIDE 0.9% FLUSH
3.0000 mL | Freq: Two times a day (BID) | INTRAVENOUS | Status: DC
  Administered 2019-03-29 – 2019-04-01 (×6): 3 mL via INTRAVENOUS

## 2019-03-29 MED ORDER — SODIUM CHLORIDE 0.9 % IV SOLN
INTRAVENOUS | Status: AC
  Administered 2019-03-29: 15:00:00 via INTRAVENOUS

## 2019-03-29 MED ORDER — SODIUM CHLORIDE 0.9 % IV SOLN
1.5000 g | Freq: Two times a day (BID) | INTRAVENOUS | Status: AC
  Administered 2019-03-30 – 2019-03-31 (×4): 1.5 g via INTRAVENOUS
  Filled 2019-03-29 (×4): qty 1.5

## 2019-03-29 MED ORDER — VANCOMYCIN HCL IN DEXTROSE 1-5 GM/200ML-% IV SOLN
1000.0000 mg | Freq: Once | INTRAVENOUS | Status: AC
  Administered 2019-03-29: 21:00:00 1000 mg via INTRAVENOUS
  Filled 2019-03-29: qty 200

## 2019-03-29 MED ORDER — LIDOCAINE HCL 1 % IJ SOLN
INTRAMUSCULAR | Status: AC
  Filled 2019-03-29: qty 20

## 2019-03-29 MED ORDER — ESCITALOPRAM OXALATE 20 MG PO TABS
20.0000 mg | ORAL_TABLET | Freq: Every day | ORAL | Status: DC
  Administered 2019-03-29 – 2019-03-31 (×3): 20 mg via ORAL
  Filled 2019-03-29 (×3): qty 1

## 2019-03-29 MED ORDER — ONDANSETRON HCL 4 MG/2ML IJ SOLN: 4.0000 mg | Freq: Four times a day (QID) | INTRAMUSCULAR | Status: DC | PRN

## 2019-03-29 MED ORDER — OXYCODONE HCL 5 MG PO TABS: 5.0000 mg | ORAL_TABLET | ORAL | Status: DC | PRN

## 2019-03-29 MED ORDER — ARFORMOTEROL TARTRATE 15 MCG/2ML IN NEBU
15.0000 ug | INHALATION_SOLUTION | Freq: Two times a day (BID) | RESPIRATORY_TRACT | Status: DC
  Administered 2019-03-29 – 2019-04-01 (×6): 15 ug via RESPIRATORY_TRACT
  Filled 2019-03-29 (×6): qty 2

## 2019-03-29 MED ORDER — FENTANYL CITRATE (PF) 250 MCG/5ML IJ SOLN
INTRAMUSCULAR | Status: DC | PRN
  Administered 2019-03-29 (×2): 25 ug via INTRAVENOUS

## 2019-03-29 MED ORDER — FAMOTIDINE 20 MG PO TABS
20.0000 mg | ORAL_TABLET | Freq: Once | ORAL | Status: AC
  Administered 2019-03-29: 20 mg via ORAL

## 2019-03-29 MED ORDER — PREDNISONE 50 MG PO TABS
50.0000 mg | ORAL_TABLET | Freq: Four times a day (QID) | ORAL | Status: AC
  Administered 2019-03-29 – 2019-03-30 (×3): 50 mg via ORAL
  Filled 2019-03-29 (×4): qty 1

## 2019-03-29 MED ORDER — SIMVASTATIN 20 MG PO TABS
20.0000 mg | ORAL_TABLET | Freq: Every day | ORAL | Status: DC
  Administered 2019-03-29 – 2019-03-31 (×3): 20 mg via ORAL
  Filled 2019-03-29 (×3): qty 1

## 2019-03-29 MED ORDER — FLUTICASONE PROPIONATE 50 MCG/ACT NA SUSP
2.0000 | Freq: Every day | NASAL | Status: DC
  Administered 2019-03-29 – 2019-04-01 (×4): 2 via NASAL
  Filled 2019-03-29: qty 16

## 2019-03-29 MED ORDER — FAMOTIDINE 20 MG PO TABS
ORAL_TABLET | ORAL | Status: AC
  Filled 2019-03-29: qty 1

## 2019-03-29 MED ORDER — LIDOCAINE HCL 1 % IJ SOLN
INTRAMUSCULAR | Status: DC | PRN
  Administered 2019-03-29: 15 mL

## 2019-03-29 MED ORDER — PROPOFOL 500 MG/50ML IV EMUL
INTRAVENOUS | Status: DC | PRN
  Administered 2019-03-29: 2.5 ug/kg/min via INTRAVENOUS

## 2019-03-29 MED ORDER — TRAMADOL HCL 50 MG PO TABS: 50.0000 mg | ORAL_TABLET | ORAL | Status: DC | PRN

## 2019-03-29 MED ORDER — ARFORMOTEROL TARTRATE 15 MCG/2ML IN NEBU
15.0000 ug | INHALATION_SOLUTION | Freq: Two times a day (BID) | RESPIRATORY_TRACT | Status: DC
  Filled 2019-03-29: qty 2

## 2019-03-29 MED ORDER — SODIUM CHLORIDE 0.9% FLUSH: 3.0000 mL | INTRAVENOUS | Status: DC | PRN

## 2019-03-29 MED ORDER — PHENYLEPHRINE 40 MCG/ML (10ML) SYRINGE FOR IV PUSH (FOR BLOOD PRESSURE SUPPORT)
PREFILLED_SYRINGE | INTRAVENOUS | Status: DC | PRN
  Administered 2019-03-29: 80 ug via INTRAVENOUS

## 2019-03-29 MED ORDER — METHYLPREDNISOLONE SODIUM SUCC 125 MG IJ SOLR: 125.0000 mg | Freq: Once | INTRAMUSCULAR | Status: DC

## 2019-03-29 MED ORDER — PROPOFOL 10 MG/ML IV BOLUS
INTRAVENOUS | Status: AC
  Filled 2019-03-29: qty 20

## 2019-03-29 MED ORDER — 0.9 % SODIUM CHLORIDE (POUR BTL) OPTIME
TOPICAL | Status: DC | PRN
  Administered 2019-03-29: 1000 mL

## 2019-03-29 MED ORDER — BUDESONIDE 0.5 MG/2ML IN SUSP
0.5000 mg | Freq: Two times a day (BID) | RESPIRATORY_TRACT | Status: DC
  Filled 2019-03-29: qty 2

## 2019-03-29 MED ORDER — ALBUTEROL SULFATE (2.5 MG/3ML) 0.083% IN NEBU
2.5000 mg | INHALATION_SOLUTION | RESPIRATORY_TRACT | Status: DC | PRN
  Administered 2019-03-29: 2.5 mg via RESPIRATORY_TRACT

## 2019-03-29 MED ORDER — PHENYLEPHRINE HCL-NACL 20-0.9 MG/250ML-% IV SOLN: 0.0000 ug/min | INTRAVENOUS | Status: DC

## 2019-03-29 MED ORDER — ALPRAZOLAM 0.5 MG PO TABS
0.5000 mg | ORAL_TABLET | Freq: Three times a day (TID) | ORAL | Status: DC | PRN
  Administered 2019-03-29 – 2019-03-31 (×3): 0.5 mg via ORAL
  Filled 2019-03-29 (×3): qty 1

## 2019-03-29 MED ORDER — ALBUTEROL SULFATE (2.5 MG/3ML) 0.083% IN NEBU
INHALATION_SOLUTION | RESPIRATORY_TRACT | Status: AC
  Filled 2019-03-29: qty 3

## 2019-03-29 MED ORDER — NITROGLYCERIN IN D5W 200-5 MCG/ML-% IV SOLN: 0.0000 ug/min | INTRAVENOUS | Status: DC

## 2019-03-29 MED ORDER — DEXMEDETOMIDINE HCL IN NACL 400 MCG/100ML IV SOLN
INTRAVENOUS | Status: DC | PRN
  Administered 2019-03-29: 60.8 ug via INTRAVENOUS

## 2019-03-29 MED ORDER — SODIUM CHLORIDE 0.9 % IV SOLN
INTRAVENOUS | Status: AC
  Filled 2019-03-29 (×3): qty 1.2

## 2019-03-29 MED ORDER — LACTATED RINGERS IV SOLN
INTRAVENOUS | Status: DC
  Administered 2019-03-29: 12:00:00 via INTRAVENOUS

## 2019-03-29 MED ORDER — SODIUM CHLORIDE 0.9 % IV SOLN: 250.0000 mL | INTRAVENOUS | Status: DC | PRN

## 2019-03-29 MED ORDER — LACTATED RINGERS IV SOLN
INTRAVENOUS | Status: DC | PRN
  Administered 2019-03-29: 12:00:00 via INTRAVENOUS

## 2019-03-29 MED ORDER — FENTANYL CITRATE (PF) 250 MCG/5ML IJ SOLN
INTRAMUSCULAR | Status: AC
  Filled 2019-03-29: qty 5

## 2019-03-29 SURGICAL SUPPLY — 91 items
BAG DECANTER FOR FLEXI CONT (MISCELLANEOUS) ×4 IMPLANT
BAG SNAP BAND KOVER 36X36 (MISCELLANEOUS) ×4 IMPLANT
BLADE CLIPPER SURG (BLADE) ×4 IMPLANT
BLADE OSCILLATING /SAGITTAL (BLADE) IMPLANT
BLADE STERNUM SYSTEM 6 (BLADE) IMPLANT
BLADE SURG 10 STRL SS (BLADE) IMPLANT
CABLE ADAPT CONN TEMP 6FT (ADAPTER) ×4 IMPLANT
CANNULA FEM VENOUS REMOTE 22FR (CANNULA) IMPLANT
CANNULA OPTISITE PERFUSION 16F (CANNULA) IMPLANT
CANNULA OPTISITE PERFUSION 18F (CANNULA) IMPLANT
CATH DIAG EXPO 6F VENT PIG 145 (CATHETERS) ×8 IMPLANT
CATH EXPO 5FR AL1 (CATHETERS) ×4 IMPLANT
CATH EXTERNAL FEMALE PUREWICK (CATHETERS) IMPLANT
CATH INFINITI 6F AL2 (CATHETERS) IMPLANT
CATH S G BIP PACING (CATHETERS) ×4 IMPLANT
CHLORAPREP W/TINT 26ML (MISCELLANEOUS) ×4 IMPLANT
CLIP VESOCCLUDE MED 24/CT (CLIP) IMPLANT
CLIP VESOCCLUDE SM WIDE 24/CT (CLIP) IMPLANT
CLOSURE MYNX CONTROL 6F/7F (Vascular Products) ×4 IMPLANT
CONT SPEC 4OZ CLIKSEAL STRL BL (MISCELLANEOUS) ×8 IMPLANT
COVER BACK TABLE 80X110 HD (DRAPES) ×4 IMPLANT
COVER DOME SNAP 22 D (MISCELLANEOUS) IMPLANT
COVER WAND RF STERILE (DRAPES) IMPLANT
CRADLE DONUT ADULT HEAD (MISCELLANEOUS) ×4 IMPLANT
DECANTER SPIKE VIAL GLASS SM (MISCELLANEOUS) ×4 IMPLANT
DERMABOND ADVANCED (GAUZE/BANDAGES/DRESSINGS) ×2
DERMABOND ADVANCED .7 DNX12 (GAUZE/BANDAGES/DRESSINGS) ×2 IMPLANT
DEVICE CLOSURE PERCLS PRGLD 6F (VASCULAR PRODUCTS) ×4 IMPLANT
DRAPE INCISE IOBAN 66X45 STRL (DRAPES) IMPLANT
DRSG TEGADERM 4X4.75 (GAUZE/BANDAGES/DRESSINGS) ×4 IMPLANT
ELECT CAUTERY BLADE 6.4 (BLADE) IMPLANT
ELECT REM PT RETURN 9FT ADLT (ELECTROSURGICAL) ×4
ELECTRODE REM PT RTRN 9FT ADLT (ELECTROSURGICAL) ×2 IMPLANT
FELT TEFLON 6X6 (MISCELLANEOUS) IMPLANT
FEMORAL VENOUS CANN RAP (CANNULA) IMPLANT
GAUZE SPONGE 4X4 12PLY STRL (GAUZE/BANDAGES/DRESSINGS) ×4 IMPLANT
GAUZE SPONGE 4X4 12PLY STRL LF (GAUZE/BANDAGES/DRESSINGS) ×4 IMPLANT
GLOVE BIO SURGEON STRL SZ7.5 (GLOVE) ×4 IMPLANT
GLOVE BIO SURGEON STRL SZ8 (GLOVE) IMPLANT
GLOVE EUDERMIC 7 POWDERFREE (GLOVE) ×4 IMPLANT
GLOVE ORTHO TXT STRL SZ7.5 (GLOVE) IMPLANT
GOWN STRL REUS W/ TWL LRG LVL3 (GOWN DISPOSABLE) ×4 IMPLANT
GOWN STRL REUS W/ TWL XL LVL3 (GOWN DISPOSABLE) ×6 IMPLANT
GOWN STRL REUS W/TWL LRG LVL3 (GOWN DISPOSABLE) ×4
GOWN STRL REUS W/TWL XL LVL3 (GOWN DISPOSABLE) ×6
GUIDEWIRE SAFE TJ AMPLATZ EXST (WIRE) ×4 IMPLANT
GUIDEWIRE STRAIGHT .035 260CM (WIRE) ×4 IMPLANT
INSERT FOGARTY SM (MISCELLANEOUS) IMPLANT
KIT BASIN OR (CUSTOM PROCEDURE TRAY) ×4 IMPLANT
KIT DILATOR VASC 18G NDL (KITS) IMPLANT
KIT HEART LEFT (KITS) ×4 IMPLANT
KIT SUCTION CATH 14FR (SUCTIONS) IMPLANT
KIT TURNOVER KIT B (KITS) ×4 IMPLANT
LOOP VESSEL MAXI BLUE (MISCELLANEOUS) IMPLANT
LOOP VESSEL MINI RED (MISCELLANEOUS) IMPLANT
NEEDLE 22X1 1/2 (OR ONLY) (NEEDLE) ×4 IMPLANT
NS IRRIG 1000ML POUR BTL (IV SOLUTION) ×4 IMPLANT
PACK ENDO MINOR (CUSTOM PROCEDURE TRAY) ×4 IMPLANT
PAD ARMBOARD 7.5X6 YLW CONV (MISCELLANEOUS) ×8 IMPLANT
PAD ELECT DEFIB RADIOL ZOLL (MISCELLANEOUS) ×4 IMPLANT
PENCIL BUTTON HOLSTER BLD 10FT (ELECTRODE) IMPLANT
PERCLOSE PROGLIDE 6F (VASCULAR PRODUCTS) ×8
SET MICROPUNCTURE 5F STIFF (MISCELLANEOUS) ×4 IMPLANT
SHEATH BRITE TIP 6FR 35CM (SHEATH) ×4 IMPLANT
SHEATH PINNACLE 6F 10CM (SHEATH) ×4 IMPLANT
SHEATH PINNACLE 8F 10CM (SHEATH) ×4 IMPLANT
SLEEVE REPOSITIONING LENGTH 30 (MISCELLANEOUS) ×4 IMPLANT
SPONGE LAP 4X18 RFD (DISPOSABLE) IMPLANT
STOPCOCK MORSE 400PSI 3WAY (MISCELLANEOUS) ×8 IMPLANT
SUT ETHIBOND X763 2 0 SH 1 (SUTURE) IMPLANT
SUT GORETEX CV 4 TH 22 36 (SUTURE) IMPLANT
SUT GORETEX CV4 TH-18 (SUTURE) IMPLANT
SUT MNCRL AB 3-0 PS2 18 (SUTURE) IMPLANT
SUT PROLENE 5 0 C 1 36 (SUTURE) IMPLANT
SUT PROLENE 6 0 C 1 30 (SUTURE) IMPLANT
SUT SILK  1 MH (SUTURE) ×2
SUT SILK 1 MH (SUTURE) ×2 IMPLANT
SUT VIC AB 2-0 CT1 27 (SUTURE)
SUT VIC AB 2-0 CT1 TAPERPNT 27 (SUTURE) IMPLANT
SUT VIC AB 2-0 CTX 36 (SUTURE) IMPLANT
SUT VIC AB 3-0 SH 8-18 (SUTURE) IMPLANT
SYR 50ML LL SCALE MARK (SYRINGE) ×4 IMPLANT
SYR BULB IRRIGATION 50ML (SYRINGE) IMPLANT
SYR CONTROL 10ML LL (SYRINGE) IMPLANT
SYR MEDRAD MARK V 150ML (SYRINGE) ×4 IMPLANT
TOWEL GREEN STERILE (TOWEL DISPOSABLE) ×8 IMPLANT
TRANSDUCER W/STOPCOCK (MISCELLANEOUS) ×8 IMPLANT
TRAY FOLEY SLVR 16FR TEMP STAT (SET/KITS/TRAYS/PACK) IMPLANT
URINAL MALE W/LID DISP 1000CC (MISCELLANEOUS) IMPLANT
VALVE HEART TRANSCATH SZ3 26MM (Valve) ×4 IMPLANT
WIRE .035 3MM-J 145CM (WIRE) ×4 IMPLANT

## 2019-03-29 NOTE — Progress Notes (Signed)
Attempted to call Daughter to update that patient on [place], no answer at this time will try to call again at a later time. [place] Zain[place], Bettina Gavia rN

## 2019-03-29 NOTE — Anesthesia Procedure Notes (Signed)
Arterial Line Insertion Start/End6/11/2018 11:40 AM Performed by: Elayne Snare, CRNA, CRNA  Lidocaine 1% used for infiltration Right, radial was placed Catheter size: 20 G Hand hygiene performed  and maximum sterile barriers used  Allen's test indicative of satisfactory collateral circulation Attempts: 1 Procedure performed without using ultrasound guided technique. Following insertion, dressing applied and Biopatch. Post procedure assessment: normal  Patient tolerated the procedure well with no immediate complications.

## 2019-03-29 NOTE — Progress Notes (Signed)
@  2145 Pt's daughter, Erin Hearing, called unit and updated on pt's current condition. All questions answered and daughter endorsed she would call in the AM to check in on pt.

## 2019-03-29 NOTE — CV Procedure (Signed)
[facility]  TAVR OPERATIVE NOTE   Date of Procedure:  03/29/2019  Preoperative Diagnosis: Severe Aortic Stenosis   Postoperative Diagnosis: Same   Procedure:    Transcatheter Aortic Valve Replacement - Transfemoral Approach  Edwards Sapien 3 THV (size 26 mm, model # [id number], serial # [id number])   Co-Surgeons:  Lauree Chandler, MD and Gaye Pollack, MD   Anesthesiologist:  Conrad What Cheer  Echocardiographer:  Cro[place]u  Pre-operative Echo Findings:  Severe aortic stenosis  Normal left ventricular systolic function  Post-operative Echo Findings:  No paravalvular leak  Normal left ventricular systolic function  BRIEF CLINICAL NOTE AND INDICATIONS FOR SURGERY  85 yo female with CAD, COPD, HLD, PE and severe aortic stenosis here today for TAVR. During her pre op work upfor TAVR,she had a right and left heart cath on 07/07/18 which showed a severe, heavily calcified (80%) stenosis in the proximal LADthat required revascularization. The patient was discharged home but called inthe next day with complaints of an intensely pruritic rash. Steroids were called into her pharmacy. She returned back [facility] on 07/23/18 for coronary atherectomy with stent placement to mLAD. Given previous allergy she was pre-medicated with steroids and benadryl. Despite pre medication she still had a delayed contrast reaction greater than 12 hours after PCI resulting in pruritus and generalized erythema and associated hypotensionw/ SBPinto the 90s. Due to her severe reaction is was decided to premedicate herbefore and after scans and keep inpatient. She did well with CT scans and had no reaction with pre/post regimen. Scan did show an incidental finding of an acute right lung PE. LE dopplers showed right leg DVT. She was started on Eliquis and continued on plavix given recent stent. Aspirin was discontinued. Plan is for 6 months of anticoagulation before possible TAVR.  During the course of the  patient's preoperative work up they have been evaluated comprehensively by a multidisciplinary team of specialists coordinated through the [facility] in the [place] [facility] [facility].  They have been demonstrated to suffer from symptomatic severe aortic stenosis as noted above. The patient has been counseled extensively as to the relative risks and benefits of all options for the treatment of severe aortic stenosis including long term medical therapy, conventional surgery for aortic valve replacement, and transcatheter aortic valve replacement.  The patient has been independently evaluated by Dr. Cyndia Bent with CT surgery and they are felt to be at high risk for conventional surgical aortic valve replacement. The surgeon indicated the patient would be a poor candidate for conventional surgery. Based upon review of all of the patient's preoperative diagnostic tests they are felt to be candidate for transcatheter aortic valve replacement using the transfemoral approach as an alternative to high risk conventional surgery.    Following the decision to proceed with transcatheter aortic valve replacement, a discussion has been held regarding what types of management strategies would be attempted intraoperatively in the event of life-threatening complications, including whether or not the patient would be considered a candidate for the use of cardiopulmonary bypass and/or conversion to open sternotomy for attempted surgical intervention.  The patient has been advised of a variety of complications that might develop peculiar to this approach including but not limited to risks of death, stroke, paravalvular leak, aortic dissection or other major vascular complications, aortic annulus rupture, device embolization, cardiac rupture or perforation, acute myocardial infarction, arrhythmia, heart block or bradycardia requiring permanent pacemaker placement, congestive heart failure, respiratory  failure, renal failure, pneumonia, infection, other  late complications related to structural valve deterioration or migration, or other complications that might ultimately cause a temporary or permanent loss of functional independence or other long term morbidity.  The patient provides full informed consent for the procedure as described and all questions were answered preoperatively.    DETAILS OF THE OPERATIVE PROCEDURE  PREPARATION:   The patient is brought to the operating room on the above mentioned date and central monitoring was established by the anesthesia team including placement of a radial arterial line. The patient is placed in the supine position on the operating table.  Intravenous antibiotics are administered. Conscious sedation is used.   Baseline transthoracic echocardiogram was performed. The patient's chest, abdomen, both groins, and both lower extremities are prepared and draped in a sterile manner. A time out procedure is performed.   PERIPHERAL ACCESS:   Using the modified Seldinger technique, femoral arterial and venous access were obtained with placement of a 6 French sheath in the left femoral artery and a 7 French sheath in the left femoral vein using u/s guidance. A pigtail diagnostic catheter was passed through the femoral arterial sheath under fluoroscopic guidance into the aortic root.  A temporary transvenous pacemaker catheter was passed through the femoral venous sheath under fluoroscopic guidance into the right ventricle.  The pacemaker was tested to ensure stable lead placement and pacemaker capture. Aortic root angiography was performed in order to determine the optimal angiographic angle for valve deployment.  TRANSFEMORAL ACCESS:  A micropuncture kit was used to gain access to the right femoral artery. Position confirmed with angiography. Pre-closure with double ProGlide closure devices. The patient was heparinized systemically and ACT verified > 250 seconds.     A 14 Fr transfemoral E-sheath was introduced into the right femoral artery after progressively dilating over an Amplatz superstiff wire. An AL-1 catheter was used to direct a straight-tip exchange length wire across the native aortic valve into the left ventricle. This was exchanged out for a pigtail catheter and position was confirmed in the LV apex. Simultaneous LV and Ao pressures were recorded.  The pigtail catheter was then exchanged for an Amplatz Extra-stiff wire in the LV apex.   TRANSCATHETER HEART VALVE DEPLOYMENT:  An Edwards Sapien 3 THV (size 26 mm) was prepared and crimped per manufacturer's guidelines, and the proper orientation of the valve is confirmed on the [employer] delivery system. The valve was advanced through the introducer sheath using normal technique until in an appropriate position in the abdominal aorta beyond the sheath tip. The balloon was then retracted and using the fine-tuning wheel was centered on the valve. The valve was then advanced across the aortic arch using appropriate flexion of the catheter. The valve was carefully positioned across the aortic valve annulus. The Commander catheter was retracted using normal technique. Once final position of the valve has been confirmed by angiographic assessment, the valve is deployed while temporarily holding ventilation and during rapid ventricular pacing to maintain systolic blood pressure < 50 mmHg and pulse pressure < 10 mmHg. The balloon inflation is held for >3 seconds after reaching full deployment volume. Once the balloon has fully deflated the balloon is retracted into the ascending aorta and valve function is assessed using TTE. There is felt to be no paravalvular leak and no central aortic insufficiency.  The patient's hemodynamic recovery following valve deployment is good.  The deployment balloon and guidewire are both removed. Echo demostrated acceptable post-procedural gradients, stable mitral valve function,  and no AI.  PROCEDURE COMPLETION:  The sheath was then removed and closure devices were completed. Protamine was administered once femoral arterial repair was complete. The temporary pacemaker, pigtail catheters and femoral sheaths were removed with a Mynx closure device placed in the left femoral artery and manual pressure used for venous hemostasis.   The patient tolerated the procedure well and is transported to the surgical intensive care in stable condition. There were no immediate intraoperative complications. All sponge instrument and needle counts are verified correct at completion of the operation.   No blood products were administered during the operation.  The patient received a total of 40 mL of intravenous contrast during the procedure.  Lauree Chandler MD 03/29/2019 2:07 PM

## 2019-03-29 NOTE — Anesthesia Postprocedure Evaluation (Signed)
Anesthesia Post Note  Patient: Jessica [name]  Procedure(s) Performed: TRANSCATHETER AORTIC VALVE REPLACEMENT, TRANSFEMORAL (N/A Chest) TRANSESOPHAGEAL ECHOCARDIOGRAM (TEE) (N/A )     Patient location during evaluation: PACU Anesthesia Type: MAC Level of consciousness: awake and alert Pain management: pain level controlled Vital Signs Assessment: post-procedure vital signs reviewed and stable Respiratory status: spontaneous breathing, nonlabored ventilation, respiratory function stable and patient connected to nasal cannula oxygen Cardiovascular status: stable and blood pressure returned to baseline Postop Assessment: no apparent nausea or vomiting Anesthetic complications: no    Last Vitals:  Vitals:   03/29/19 1430 03/29/19 1451  BP: (!) 108/43   Pulse: 66   Resp: 17   Temp:  36.5 C  SpO2: 97%     Last Pain:  Vitals:   03/29/19 1451  TempSrc: Temporal  PainSc:                  [name] DAVID

## 2019-03-29 NOTE — Telephone Encounter (Signed)
Spoke with the patients pharmacy. She does have 1 refill left on file. The pharmacist stated that this prescription is to soon to be filled, the earliest she can get this is on 04/06/2019.  Spoke with the patients daughter. She expressed understanding.

## 2019-03-29 NOTE — Anesthesia Procedure Notes (Signed)
Procedure Name: MAC Date/Time: 03/29/2019 12:35 PM Performed by: Elayne Snare, CRNA Pre-anesthesia Checklist: Patient identified, Emergency Drugs available, Suction available and Patient being monitored Patient Re-evaluated:Patient Re-evaluated prior to induction Oxygen Delivery Method: Simple face mask

## 2019-03-29 NOTE — H&P (Signed)
[facility] [address]       [place] [address]             [phone]      Cardiothoracic Surgery Admission History and Physical  Referring Provider is [name], Jessica November, MD Primary Cardiologist is Jessica Rogue, MD PCP is [name], [name], MD      Chief Complaint  Patient presents with   Aortic Stenosis        HPI:  The patient is a 85 year old woman with a history of severe COPD, coronary artery disease, and severe aortic stenosis who was worked up for consideration of TAVR in September 2019 after presenting with shortness of breath.  Her cardiac catheterization on 07/07/2018 showed a heavily calcified 80% proximal LAD stenosis and she underwent atherectomy and stenting on 07/23/2018.  She was premedicated with steroids and Benadryl due to previous known allergy to intravenous contrast.  Despite this she developed a delayed contrast reaction resulting in pruritus, generalized edema, and hypotension.  Due to her severe reaction she was premedicated before and after her CT scans and kept as an inpatient.  She did well with this with no significant reaction.  CT scan did show an incidental finding of an acute right lung pulmonary embolism and lower extremity Doppler showed right leg DVT.  She was started on Eliquis and continued on Plavix for 6 months before considering TAVR.  She was admitted in February 2020 with pneumonia and likely COPD exacerbation with acute congestive heart failure.  She was seen back in the emergency room in March with orthostasis and urinary tract infection.  She was seen in a telehealth visit on 02/10/2019 by our PA Jessica [name] and still had some shortness of breath with exertion but was fairly stable.  Her Plavix was discontinued since it has been 6 months since her PCI with plans for follow-up.  She had a follow-up echocardiogram on 03/17/2019 which showed an increase in the mean gradient across aortic valve to 53 mmHg with a peak gradient of  73 mmHg.  Aortic valve area was 0.58 cm.  Left ventricular ejection fraction was 60 to 65%.  The patient lives with her daughter.  She said that they went to the beach a couple weeks ago and the patient could not really do anything due to exertional shortness of breath.  She has shortness of breath with walking across the room and gets very short of breath going to the bathroom with panic attacks.  She denies orthopnea and PND.  She has had some lower extremity edema.  She denies any chest discomfort.  She has had no dizziness or syncope.  Her daughter has noted a marked worsening of her shortness of breath over the past few months.      Past Medical History:  Diagnosis Date   Anxiety    congenital nystagmus    COPD (chronic obstructive pulmonary disease) (HCC)    Coronary artery disease    DJD (degenerative joint disease)    Fibromyalgia    Low back pain syndrome    Lung nodules    Memory loss    Other and unspecified hyperlipidemia    Pancreatic lesion    Severe aortic stenosis    Venous insufficiency          Past Surgical History:  Procedure Laterality Date   ABDOMINAL HYSTERECTOMY     ANTERIOR CERVICAL DISCECTOMY     CARPAL TUNNEL RELEASE  12/2011   right  arm   CATARACT EXTRACTION     CORONARY ATHERECTOMY  07/23/2018   PTCA/orbital atherectomy/DES x 1 proximal to mid LAD   CORONARY ATHERECTOMY N/A 07/23/2018   Procedure: CORONARY ATHERECTOMY;  Surgeon: Jessica Blanks, MD;  Location: [facility];  Service: Cardiovascular;  Laterality: N/A;   CORONARY STENT INTERVENTION     CORONARY STENT INTERVENTION N/A 07/23/2018   Procedure: CORONARY STENT INTERVENTION;  Surgeon: Jessica Blanks, MD;  Location: [facility];  Service: Cardiovascular;  Laterality: N/A;   LUMBAR LAMINECTOMY     right knee arthroscopy     right shoulder replacement  04/2009   right shoulder surgery  2008   Dr. Noemi [name]    RIGHT/LEFT HEART CATH AND CORONARY ANGIOGRAPHY N/A 07/07/2018   Procedure: RIGHT/LEFT HEART CATH AND CORONARY ANGIOGRAPHY;  Surgeon: Jessica Blanks, MD;  Location: [facility];  Service: Cardiovascular;  Laterality: N/A;   ULTRASOUND GUIDANCE FOR VASCULAR ACCESS  07/07/2018   Procedure: Ultrasound Guidance For Vascular Access;  Surgeon: Jessica Blanks, MD;  Location: [facility];  Service: Cardiovascular;;         Family History  Problem Relation Age of Onset   Other Mother        broken hip   Other Father        kidney problems   Colon cancer Neg Hx    Stomach cancer Neg Hx    Rectal cancer Neg Hx    Pancreatic cancer Neg Hx     Social History        Socioeconomic History   Marital status: Widowed    Spouse name: Not on file   Number of children: 2   Years of education: Not on file   Highest education level: Not on file  Occupational History   Occupation: Still works,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, product/process development strain: Not on file   Food insecurity:    Worry: Not on file    Inability: Not on file   Transportation needs:    Medical: Not on file    Non-medical: Not on file  Tobacco Use   Smoking status: Former Smoker    Packs/day: 2.00    Years: 30.00    Pack years: 60.00    Types: Cigarettes    Last attempt to quit: 10/28/1991    Years since quitting: 27.4   Smokeless tobacco: Never Used  Substance and Sexual Activity   Alcohol use: Yes    Comment: 1 drink per night   Drug use: No   Sexual activity: Not Currently  Lifestyle   Physical activity:    Days per week: Not on file    Minutes per session: Not on file   Stress: Not on file  Relationships   Social connections:    Talks on phone: Not on file    Gets together: Not on file    Attends religious service: Not on file    Active member of club or organization: Not on file    Attends  meetings of clubs or organizations: Not on file    Relationship status: Not on file   Intimate partner violence:    Fear of current or ex partner: Not on file    Emotionally abused: Not on file    Physically abused: Not on file    Forced sexual activity: Not on file  Other Topics Concern   Not on file  Social History Narrative   1 sibling  alive age 77   1 sibling alive age 61   1 sibling deceased age 90   1 sibling alive age 45          Current Outpatient Medications  Medication Sig Dispense Refill   albuterol (PROVENTIL) (2.5 MG/3ML) 0.083% nebulizer solution Take 3 mLs (2.5 mg total) by nebulization every 4 (four) hours as needed for wheezing or shortness of breath. DX: COPD J44.9 75 mL 12   ALPRAZolam (XANAX) 0.5 MG tablet TAKE ONE TABLET BY MOUTH AT BEDTIME AS NEEDED FOR ANXIETY 30 tablet 2   AMBULATORY NON FORMULARY MEDICATION Medication Name: Incentive spirometer Use 10-15 times per day 1 each 0   arformoterol (BROVANA) 15 MCG/2ML NEBU Take 2 mLs (15 mcg total) by nebulization 2 (two) times daily. DX: COPD J44.9 (Patient taking differently: Take 15 mcg by nebulization 2 (two) times daily. ) 120 mL 12   benzonatate (TESSALON) 200 MG capsule Take 1 capsule (200 mg total) by mouth 3 (three) times daily as needed for cough. 20 capsule 0   budesonide (PULMICORT) 0.5 MG/2ML nebulizer solution Take 2 mLs (0.5 mg total) by nebulization 2 (two) times daily. DX: COPD J44.9 120 mL 12   Cholecalciferol (VITAMIN D) 1000 UNITS capsule Take 1,000 Units by mouth daily.       ELIQUIS 5 MG TABS tablet TAKE 1 TABLET BY MOUTH TWICE A DAY 60 tablet 5   escitalopram (LEXAPRO) 20 MG tablet Take 1 tablet (20 mg total) by mouth daily. 90 tablet 1   fluticasone (FLONASE) 50 MCG/ACT nasal spray Place 2 sprays into both nostrils daily. 16 [name] 0   guaiFENesin (MUCINEX) 600 MG 12 hr tablet Take 1 tablet (600 mg total) by mouth 2 (two) times daily. 14 tablet 0   loratadine  (CLARITIN) 10 MG tablet Take 1 tablet (10 mg total) by mouth daily. 10 tablet 0   metoprolol tartrate (LOPRESSOR) 25 MG tablet Take 0.5 tablets (12.5 mg total) by mouth 2 (two) times daily. 60 tablet 0   multivitamin-lutein (OCUVITE-LUTEIN) CAPS capsule Take 1 capsule by mouth daily.     pantoprazole (PROTONIX) 40 MG tablet Take 1 tablet (40 mg total) by mouth daily at 12 noon. 30 tablet 0   Respiratory Therapy Supplies (FLUTTER) DEVI 1 each by Does not apply route daily. 1 each 0   simvastatin (ZOCOR) 20 MG tablet Take 1 tablet (20 mg total) by mouth at bedtime. 90 tablet 3   traZODone (DESYREL) 50 MG tablet Take 0.5-1 tablets (25-50 mg total) by mouth at bedtime as needed for sleep. 90 tablet 0   triamcinolone cream (KENALOG) 0.1 % Apply 1 application topically 2 (two) times daily. 45 [name] 0   VENTOLIN HFA 108 (90 Base) MCG/ACT inhaler INHALE 2 PUFFS EVERY 6HRS AS NEEDED FOR WHEEZING OR SHORTNESS OF BREATH. DO NOT USE IF USING NEBS. (Patient taking differently: Inhale 2 puffs into the lungs every 6 (six) hours as needed for wheezing. ) 18 Inhaler 2   No current facility-administered medications for this visit.          Allergies  Allergen Reactions   Contrast Media [Iodinated Diagnostic Agents] Itching, Rash and Other (See Comments)    Hypotension, Skin turns red like a sunburn   Iohexol Itching, Rash and Other (See Comments)    Hypotension, Skin turns red like a sunburn      Review of Systems:              General:  normal appetite, + decreased energy, no weight gain, no weight loss, no fever             Cardiac:                       no chest pain with exertion, no chest pain at rest, +SOB with any exertion, + resting SOB, no PND, no orthopnea, no palpitations, no arrhythmia, no atrial fibrillation, + LE edema, no dizzy spells, no syncope             Respiratory:                 + shortness of breath, + home oxygen, no productive cough, no dry  cough, no bronchitis, + wheezing, no hemoptysis, no asthma, no pain with inspiration or cough, no sleep apnea, no CPAP at night             GI:                               no difficulty swallowing, no reflux, no frequent heartburn, no hiatal hernia, no abdominal pain, no constipation, no diarrhea, no hematochezia, no hematemesis, no melena             GU:                              no dysuria,  no frequency, no urinary tract infection, no hematuria, no kidney stones, no kidney disease             Vascular:                     no pain suggestive of claudication, no pain in feet, + leg cramps, no varicose veins, no DVT, no non-healing foot ulcer             Neuro:                         no stroke, no TIA's, no seizures, no headaches, no temporary blindness one eye,  no slurred speech, no peripheral neuropathy, no chronic pain, + instability of gait, no memory/cognitive dysfunction             Musculoskeletal:         + arthritis, no joint swelling, no myalgias, + difficulty walking, + reduced mobility              Skin:                            no rash, no itching, no skin infections, no pressure sores or ulcerations             Psych:                         + anxiety, no depression, no nervousness, no unusual recent stress             Eyes:                           no blurry vision, no floaters, no recent vision changes, + wears glasses or contacts             ENT:                            +  hearing loss, no loose or painful teeth, edentulous             Hematologic:               no easy bruising, no abnormal bleeding, no clotting disorder, no frequent epistaxis             Endocrine:                   no diabetes, does not check CBG's at home                            Physical Exam:              BP (!) 93/52    Pulse 97    Temp (!) 97.5 F (36.4 C) (Skin)    Resp 20    Ht 5\' 4"  (1.626 m)    Wt 134 lb (60.8 kg)    SpO2 (!) 86% Comment: RA   BMI 23.00 kg/m              General:                       Elderly but looks good for 89             HEENT:                       Unremarkable, NCAT, PERLA,              Neck:                           no JVD, no bruits, no adenopathy or thyromegaly             Chest:                          clear to auscultation, symmetrical breath sounds, expiratory wheezes, no rhonchi              CV:                              RRR, grade lll/VI crescendo/decrescendo murmur heard best at RSB,  no diastolic murmur             Abdomen:                    soft, non-tender, no masses              Extremities:                 warm, well-perfused, pulses palpable in feet, mild LE edema             Rectal/GU                   Deferred             Neuro:                         Grossly non-focal and symmetrical throughout             Skin:  Clean and dry, no rashes, no breakdown   Diagnostic Tests:  Physicians   Panel Physicians Referring Physician Case Authorizing Physician  Jessica Blanks, MD (Primary)    Procedures   RIGHT/LEFT HEART CATH AND CORONARY ANGIOGRAPHY  Conclusion     Prox LAD lesion is 80% stenosed.  Ost 1st Mrg to 1st Mrg lesion is 40% stenosed.  1. Severe, heavily calcified (80%) stenosis in the proximal LAD just before the takeoff the moderate caliber diagonal branch.  2. Mild non-obstructive disease in the Circumflex arter 3. The RCA is a large caliber vessel with no obstructive disease 4. Severe aortic valve stenosis (peak to peak gradient 44 mmHg, mean gradient 42.1 mmHg, AVA 1.41 cm2)  Recommendations: Her aortic valve is heavily calcified and was difficult to cross. The mean gradient suggests severe stenosis. This is c/w data from her echo which suggested severe aortic stenosis. She will need TAVR. She will need PCI of the LAD stenosis prior to TAVR. This will require orbital atherectomy given heavy calcification of the stenosis. I will review timing with the TAVR team. She  will also need to have dental extractions prior to TAVR. Will discuss with Dr. [name] and plan timing of dental extraction with PCI/stenting of the LAD.     Procedural Details   Technical Details Indication: 85 yo female with history of severe aortic stenosis, COPD, fibromyalgia, hyperlipidemia who is here today for cardiac cath in workup for TAVR. Her aortic stenosis has been moderate for the last several years. She has been followed by Dr. [name]. She has had recent worsening of her dyspnea. Echo 06/10/18 with LVEF=55-60%, moderate LVH, grade 1 diastolic dysfunction. The aortic valve is thickened with severely thickened leaflets. Mean gradient 49 mmHg, peak gradient 80 mmHg. DVI 0.15. AVA 0.47 cm2.   Procedure: The risks, benefits, complications, treatment options, and expected outcomes were discussed with the patient. The patient and/or family concurred with the proposed plan, giving informed consent. The patient was brought to the cath lab after IV hydration was given. The patient was sedated with Versed. The right wrist was prepped and draped in a sterile fashion. 1% lidocaine was used for local anesthesia. Using the modified Seldinger access technique, a 5 French sheath was placed in the right radial artery. 3 mg Verapamil was given through the sheath. 3000 units IV heparin was given. I attempted to change out the IV catheter in the right antecubital vein over a wire but the wire would not advance. I then used u/s guidance to gain access in the right femoral vein. Images of the u/s saved and stored and printed to the patient's chart. Right heart catheterization performed with a balloon tipped catheter. Standard diagnostic catheters were used to perform selective coronary angiography. I crossed the aortic valve with an AL-3 and a straight wire. LV pressures measured. The sheath was removed from the right radial artery and a Terumo hemostasis band was applied at the arteriotomy site on the right  wrist.     Estimated blood loss <50 mL.  During this procedure the patient was administered the following to achieve and maintain moderate conscious sedation: Versed 0.5 mg, while the patient's heart rate, blood pressure, and oxygen saturation were continuously monitored. The period of conscious sedation was 48 minutes, of which I was present face-to-face 100% of this time.  Medications  (Filter: Administrations occurring from 07/07/18 1315 to 07/07/18 1446)          Medication Rate/Dose/Volume Action  Date Time  midazolam (VERSED) injection (mg) 0.5 mg Given 07/07/18 1350   Total dose as of 03/22/19 1639        0.5 mg        lidocaine (PF) (XYLOCAINE) 1 % injection (mL) 2 mL Given 07/07/18 1350   Total dose as of 03/22/19 1639 12 mL Given 1401   14 mL        Heparin (Porcine) in NaCl 1000-0.9 UT/500ML-% SOLN (mL) 500 mL Given 07/07/18 1358   Total dose as of 03/22/19 1639 500 mL Given 1358   1,000 mL        Radial Cocktail/Verapamil only (mL) 10 mL Given 07/07/18 1406   Total dose as of 03/22/19 1639        10 mL        heparin injection (Units) 3,000 Units Given 07/07/18 1415   Total dose as of 03/22/19 1639        3,000 Units        iohexol (OMNIPAQUE) 350 MG/ML injection (mL) 50 mL Given 07/07/18 1445   Total dose as of 03/22/19 1639        50 mL        Sedation Time   Sedation Time Physician-1: 48 minutes 2 seconds  Complications   Complications documented before study signed (07/07/2018 2:50 PM EDT)    RIGHT/LEFT HEART CATH AND CORONARY ANGIOGRAPHY   None Documented by Jessica Blanks, MD 07/07/2018 2:42 PM EDT  Time [name]: Intraprocedure      Coronary Findings   Diagnostic  Dominance: Right  Left Anterior Descending  Vessel is large.  Prox LAD lesion 80% stenosed  Prox LAD lesion is 80% stenosed. The lesion is calcified.  First Diagonal Branch  Vessel is moderate in size.    Left Circumflex  First Obtuse Marginal Branch  Vessel is moderate in size.  Ost 1st Mrg to 1st Mrg lesion 40% stenosed  Ost 1st Mrg to 1st Mrg lesion is 40% stenosed.  Right Coronary Artery  Vessel is large.  Intervention   No interventions have been documented.  Coronary Diagrams   Diagnostic  Dominance: Right    Intervention   Implants    No implant documentation for this case.  Syngo Images   Show images for CARDIAC CATHETERIZATION  Images on Long Term Storage   Show images for Santillanes, AKANKSHA BELLMORE to Procedure Log   Procedure Log    Hemo Data    Most Recent Value  Fick Cardiac Output 9.65 L/min  Fick Cardiac Output Index 6.17 (L/min)/BSA  Aortic Mean Gradient 42.1 mmHg  Aortic Peak Gradient 44 mmHg  Aortic Valve Area 1.41  Aortic Value Area Index 0.9 cm2/BSA  RA A Wave 5 mmHg  RA V Wave 2 mmHg  RA Mean 2 mmHg  RV Systolic Pressure 35 mmHg  RV Diastolic Pressure 1 mmHg  RV EDP 4 mmHg  PA Systolic Pressure 36 mmHg  PA Diastolic Pressure 10 mmHg  PA Mean 21 mmHg  PW A Wave 10 mmHg  PW V Wave 7 mmHg  PW Mean 7 mmHg  AO Systolic Pressure 381 mmHg  AO Diastolic Pressure 70 mmHg  AO Mean 829 mmHg  LV Systolic Pressure 937 mmHg  LV Diastolic Pressure 7 mmHg  LV EDP 11 mmHg  AOp Systolic Pressure 169 mmHg  AOp Diastolic Pressure 65 mmHg  AOp Mean Pressure 678 mmHg  LVp Systolic Pressure 938 mmHg  LVp Diastolic Pressure 7 mmHg  LVp EDP Pressure 11  mmHg  QP/QS 1  TPVR Index 3.41 HRUI  TSVR Index 17.2 HRUI  PVR SVR Ratio 0.13  TPVR/TSVR Ratio 0.2      ECHOCARDIOGRAM COMPLETE  Order #: [id number] Accession #: [id number]  Patient Info   Patient name:Jessica [name]  RWE:[id number]  Age:85 y.o.  YPP:[id number]  Vitals   BP Height Weight BSA (Calculated - sq m)  93/52 5\' 4"  (1.626 m) 134 lb (60.8 kg) 1.66 sq meters  Result Notes for ECHOCARDIOGRAM COMPLETE   Notes recorded by Eileen Stanford, PA-C on 03/18/2019 at 9:44  AM EDT Gradients are worse. She has critical AS and progressive symptoms. Plan to have her seen by CT surgery and proceed with TAVR      Study Result   Result status: Final result   ECHOCARDIOGRAM REPORT     Patient Name: Jessica [name] Date of Exam: 03/17/2019 Medical Rec #: [id number] Height: 64.0 in Accession #: [id number] Weight: 140.0 lb Date of Birth: 1926-10-17 BSA: 1.68 m Patient Age: 46 years BP: 157/89 mmHg Patient Gender: F HR: 109 bpm. Exam Location: [facility]   Procedure: 2D Echo, Cardiac Doppler and Color Doppler  Indications: I35.0 Aortic Stenosis  History: Patient has prior history of Echocardiogram examinations, most recent 06/10/2018. CAD COPD Risk Factors: HLD.  Sonographer: Marygrace Drought RCS Referring Phys: [id number] Leach   1. The left ventricle has normal systolic function with an ejection fraction of 60-65%. The cavity size was mildly dilated. Left ventricular diastolic Doppler parameters are indeterminate. 2. The right ventricle has normal systolic function. The cavity was normal. There is no increase in right ventricular wall thickness. 3. The mitral valve is abnormal. Mild thickening of the mitral valve leaflet. Moderate calcification of the mitral valve leaflet. There is moderate mitral annular calcification present. 4. The tricuspid valve is grossly normal. 5. The aortic valve is abnormal. Severely thickening of the aortic valve. Severe calcifcation of the aortic valve. Severe stenosis of the aortic valve. 6. The aortic root is normal in size and structure. 7. The interatrial septum was not assessed.  FINDINGS Left Ventricle: The left ventricle has normal systolic function, with an ejection fraction of 60-65%. The cavity size was mildly dilated. There is no increase in  left ventricular wall thickness. Left ventricular diastolic Doppler parameters are  indeterminate.  Right Ventricle: The right ventricle has normal systolic function. The cavity was normal. There is no increase in right ventricular wall thickness.  Left Atrium: Left atrial size was normal in size.  Right Atrium: Right atrial size was normal in size. Right atrial pressure is estimated at 3 mmHg.  Interatrial Septum: The interatrial septum was not assessed.  Pericardium: There is no evidence of pericardial effusion.  Mitral Valve: The mitral valve is abnormal. Mild thickening of the mitral valve leaflet. Moderate calcification of the mitral valve leaflet. There is moderate mitral annular calcification present. Mitral valve regurgitation is mild by color flow Doppler.  Tricuspid Valve: The tricuspid valve is grossly normal. Tricuspid valve regurgitation is mild by color flow Doppler.  Aortic Valve: The aortic valve is abnormal Severely thickening of the aortic valve. Severe calcifcation of the aortic valve. Aortic valve regurgitation was not visualized by color flow Doppler. There is Severe stenosis of the aortic valve, with a  calculated valve area of 0.85 cm.  Pulmonic Valve: The pulmonic valve was grossly normal. Pulmonic valve regurgitation is not visualized by color flow Doppler.  Aorta: The aortic root is normal in size  and structure.   +--------------+--------++  LEFT VENTRICLE     +----------------+---------++ +--------------+--------++  Diastology       PLAX 2D      +----------------+---------++ +--------------+--------++  LV e' lateral:  6.74 cm/s    LVIDd:  5.24 cm    +----------------+---------++ +--------------+--------++  LV E/e' lateral: 16.0     LVIDs:  4.05 cm    +----------------+---------++ +--------------+--------++  LV e' medial:  5.22 cm/s    LV PW:  1.28 cm     +----------------+---------++ +--------------+--------++  LV E/e' medial:  20.7     LV IVS:  1.00 cm    +----------------+---------++ +--------------+--------++  LVOT diam:  2.10 cm    +--------------+--------++  LV SV:  60 ml    +--------------+--------++  LV SV Index:  35.09    +--------------+--------++  LVOT Area:  3.46 cm   +--------------+--------++        +--------------+--------++  +---------------+----------++  RIGHT VENTRICLE     +---------------+----------++  RV Basal diam:  4.67 cm    +---------------+----------++  RV S prime:  15.90 cm/s   +---------------+----------++  TAPSE (M-mode): 2.5 cm    +---------------+----------++  RVSP:  57.2 mmHg    +---------------+----------++  +---------------+-------++-----------++  LEFT ATRIUM     Index    +---------------+-------++-----------++  LA diam:  4.50 cm  2.68 cm/m    +---------------+-------++-----------++  LA Vol (A2C):  82.7 ml  49.19 ml/m   +---------------+-------++-----------++  LA Vol (A4C):  59.7 ml  35.51 ml/m   +---------------+-------++-----------++  LA Biplane Vol: 77.3 ml  45.98 ml/m   +---------------+-------++-----------++ +------------+---------++-----------++  RIGHT ATRIUM    Index    +------------+---------++-----------++  RA Pressure: 3.00 mmHg      +------------+---------++-----------++  RA Area:  18.40 cm      +------------+---------++-----------++  RA Volume:  52.20 ml   31.05 ml/m   +------------+---------++-----------++ +------------------+------------++  AORTIC VALVE      +------------------+------------++  AV Area (Vmax):  0.68 cm    +------------------+------------++  AV Area (Vmean):  0.58 cm    +------------------+------------++  AV Area (VTI):  0.85 cm    +------------------+------------++  AV Vmax:   428.00 cm/s    +------------------+------------++  AV Vmean:  350.000 cm/s   +------------------+------------++  AV VTI:  0.887 m    +------------------+------------++  AV Peak Grad:  73.3 mmHg    +------------------+------------++  AV Mean Grad:  53.0 mmHg    +------------------+------------++  LVOT Vmax:  84.20 cm/s    +------------------+------------++  LVOT Vmean:  59.100 cm/s    +------------------+------------++  LVOT VTI:  0.218 m    +------------------+------------++  LVOT/AV VTI ratio: 0.25    +------------------+------------++  +-------------+-------++  AORTA      +-------------+-------++  Ao Root diam: 3.20 cm   +-------------+-------++  +--------------+----------++ +---------------+-----------++  MITRAL VALVE       TRICUSPID VALVE     +--------------+----------++ +---------------+-----------++  MV Area (PHT): 9.37 cm     TR Peak grad:  54.2 mmHg    +--------------+----------++ +---------------+-----------++  MV Peak grad:  9.6 mmHg     TR Vmax:  392.00 cm/s   +--------------+----------++ +---------------+-----------++  MV Mean grad:  5.0 mmHg     Estimated RAP:  3.00 mmHg    +--------------+----------++ +---------------+-----------++  MV Vmax:  1.55 m/s     RVSP:  57.2 mmHg    +--------------+----------++ +---------------+-----------++  MV Vmean:  104.0 cm/s   +--------------+----------++ +--------------+-------+  MV VTI:  0.22 m     SHUNTS     +--------------+----------++ +--------------+-------+  MV PHT:  23.49 msec    Systemic VTI:  0.22 m   +--------------+----------++ +--------------+-------+  MV Decel Time: 81 msec     Systemic Diam: 2.10 cm  +--------------+----------++ +--------------+-------+ +--------------+-----------++  MV E velocity: 108.00  cm/s   +--------------+-----------++  MV A velocity: 118.00 cm/s   +--------------+-----------++  MV E/A ratio:  0.92    +--------------+-----------++   Dorris Carnes MD Electronically signed by Dorris Carnes MD Signature Date/Time: 03/17/2019/9:46:45 PM       ADDENDUM REPORT: 08/25/2018 10:12  EXAM: OVER-READ INTERPRETATION CT CHEST  The following report is an over-read performed by radiologist Dr. Samara Snide [facility] on 08/25/2018. This over-read does not include interpretation of cardiac or coronary anatomy or pathology. The coronary CTA interpretation by the cardiologist is attached.  COMPARISON: 12/28/2014 chest CT.  FINDINGS: Please see the separate concurrent chest CT angiogram report for details.  IMPRESSION: Please see the separate concurrent chest CT angiogram report for details.   Electronically Signed By: Ilona Sorrel M.D. On: 08/25/2018 10:12   Addended by Sharyn Blitz, MD on 08/25/2018 10:14 AM    Study Result   CLINICAL DATA: Aortic Stenosis  EXAM: Cardiac TAVR CT  TECHNIQUE: The patient was scanned on a Siemens Force 176 slice scanner. A 120 kV retrospective scan was triggered in the ascending thoracic aorta at 140 HU's. Gantry rotation speed was 250 msecs and collimation was .6 mm. No beta blockade or nitro were given. The 3D data set was reconstructed in 5% intervals of the R-R cycle. Systolic and diastolic phases were analyzed on a dedicated work station using MPR, MIP and VRT modes. The patient received 80 cc of contrast.  FINDINGS: Aortic Valve: Tri leaflet and calcified with restricted leaflet motion  Aorta: Normal arch vessels severe calcific atherosclerosis  Sino-tubular Junction: 29 mm  Ascending Thoracic Aorta: 35 mm  Aortic Arch: 27 mm  Descending Thoracic Aorta: 23 mm  Sinus of Valsalva Measurements:  Non-coronary: 34.5 mm  Right - coronary: 33  mm  Left - coronary: 32.5 mm  Coronary Artery Height above Annulus:  Left Main: 16.7 mm above annulus  Right Coronary: 15.5 mm above annulus  Virtual Basal Annulus Measurements:  Maximum / Minimum Diameter: 26.3 mm x 21.7 mm  Perimeter: 76 mm  Area: 436 mm2  Coronary Arteries: Sufficient height above annulus for deployment  Optimum Fluoroscopic Angle for Delivery: LAO 14 Caudal 19 degrees  IMPRESSION: 1. Tri leaflet AV with annular area of 436 mm2 suitable for a 26 mm Sapien 3 valve  2. Normal aortic root 3.5 cm  3. Optimum angiographic angle for deployment LAO 14 Caudal 19 degrees  4. Coronary arteries sufficient height above annulus for deployment  5. No LAA thrombus  Jenkins Rouge  Electronically Signed: By: Jenkins Rouge M.D. On: 08/24/2018 18:12      CLINICAL DATA: Severe symptomatic aortic stenosis with dyspnea on exertion. Pre-TAVR evaluation.  EXAM: CT ANGIOGRAPHY CHEST, ABDOMEN AND PELVIS  TECHNIQUE: Multidetector CT imaging through the chest, abdomen and pelvis was performed using the standard protocol during bolus administration of intravenous contrast. Multiplanar reconstructed images and MIPs were obtained and reviewed to evaluate the vascular anatomy.  CONTRAST: 122mL ISOVUE-370 IOPAMIDOL (ISOVUE-370) INJECTION 76%  COMPARISON: 12/28/2014 chest CT. 06/12/2010 CT abdomen/pelvis.  FINDINGS: CTA CHEST FINDINGS  Cardiovascular: Normal heart size. No significant pericardial effusion/thickening. Left anterior descending and left circumflex coronary atherosclerosis. Severe thickening and calcification of the aortic valve. Atherosclerotic nonaneurysmal thoracic aorta. Normal caliber pulmonary arteries. Incidentally  noted are acute pulmonary emboli within lobar and segmental branches of the right upper and right middle lobes.  Mediastinum/Nodes: No discrete thyroid nodules. Unremarkable esophagus. No  pathologically enlarged axillary, mediastinal or hilar lymph nodes.  Lungs/Pleura: No pneumothorax. No pleural effusion. Mild centrilobular emphysema with diffuse bronchial wall thickening. Posterior apical right upper lobe 8 x 6 mm solid pulmonary nodule (series 5/image 13) appears slightly increased from 8 x 4 mm on 12/28/2014 chest CT. Basilar left lower lobe 6 mm solid pulmonary nodule (series 5/image 89) is stable and considered benign. No acute consolidative airspace disease, lung masses or new significant pulmonary nodules.  Musculoskeletal: No aggressive appearing focal osseous lesions. Partially visualized right shoulder arthroplasty. Moderate thoracic spondylosis.  CTA ABDOMEN AND PELVIS FINDINGS  Hepatobiliary: Normal liver with no liver mass. Normal gallbladder with no radiopaque cholelithiasis. No intrahepatic biliary ductal dilatation. CBD diameter 6-7 mm, within normal limits for age.  Pancreas: Cystic 2.3 x 1.5 cm pancreatic head lesion (series 4/image 116), increased from 1.2 x 0.7 cm on 06/12/2010 CT. No additional pancreatic lesions. No pancreatic duct dilation.  Spleen: Normal size. No mass.  Adrenals/Urinary Tract: Normal adrenals. Simple 1.8 cm interpolar left renal cyst. Subcentimeter hypodense renal cortical lesion in the lower left kidney is too small to characterize and requires no follow-up. Multiple parapelvic renal cysts in both kidneys. No hydronephrosis. No additional contour deforming renal lesions. Normal bladder.  Stomach/Bowel: Normal non-distended stomach. Normal caliber small bowel with no small bowel wall thickening. Appendix not discretely visualized. No pericecal inflammatory changes. Marked sigmoid diverticulosis, with no large bowel wall thickening or significant pericolonic fat stranding.  Vascular/Lymphatic: Atherosclerotic nonaneurysmal abdominal aorta. Patent renal and splenic veins. No pathologically enlarged  lymph nodes in the abdomen or pelvis.  Reproductive: Status post hysterectomy, with no abnormal findings at the vaginal cuff. No adnexal mass.  Other: No pneumoperitoneum, ascites or focal fluid collection.  Musculoskeletal: No aggressive appearing focal osseous lesions. Marked lumbar spondylosis.  VASCULAR MEASUREMENTS PERTINENT TO TAVR:  AORTA:  Minimal Aortic Diameter-12.4 x 11.9 mm (infrarenal abdominal aorta on series 4/image 122)  Severity of Aortic Calcification-moderate  RIGHT PELVIS:  Right Common Iliac Artery -  Minimal Diameter-8.9 x 7.8 mm  Tortuosity-mild  Calcification-moderate  Right External Iliac Artery -  Minimal Diameter-7.7 x 7.1 mm  Tortuosity-moderate  Calcification-none  Right Common Femoral Artery -  Minimal Diameter-7.3 x 6.7 mm  Tortuosity-moderate  Calcification-mild  LEFT PELVIS:  Left Common Iliac Artery -  Minimal Diameter-9.7 x 8.9 mm  Tortuosity-mild  Calcification-moderate  Left External Iliac Artery -  Minimal Diameter-7.1 x 6.9 mm  Tortuosity-moderate  Calcification-mild  Left Common Femoral Artery -  Minimal Diameter-7.7 x 7.1 mm  Tortuosity-mild  Calcification-mild  Review of the MIP images confirms the above findings.  IMPRESSION: 1. Incidental acute pulmonary emboli within the lobar and segmental branches of the right upper and right middle lobes. 2. Vascular findings and measurements pertinent to potential TAVR procedure, as detailed above. 3. Severe thickening and calcification of the aortic valve, compatible with the reported clinical history of severe aortic stenosis. 4. Aortic Atherosclerosis (ICD10-I70.0) and Emphysema (ICD10-J43.9). 5. Two-vessel coronary atherosclerosis. 6. Posterior apical right upper lobe 8 x 6 mm solid pulmonary nodule appears slightly increased in size since 2016 chest CT, potentially artifactual due to technical scan differences.  Recommend attention on follow-up chest CT in 3-6 months. 7. Cystic 2.3 cm pancreatic head lesion is increased in size since 2011 CT abdomen study. No biliary or pancreatic duct dilation  or other high risk features on this routine CT study. MRI abdomen without and with IV contrast is indicated for further characterization, and may be performed as clinically warranted. Alternatively, follow-up pancreas protocol CT abdomen without and with IV contrast may be obtained in 6-12 months. 8. Marked sigmoid diverticulosis.  Critical Value/emergent results were called by telephone at the time of interpretation on 08/25/2018 at 10:11 am to Dr. Lauree Chandler , who verbally acknowledged these results.   Electronically Signed By: Ilona Sorrel M.D. On: 08/25/2018 10:14   CLINICAL DATA: Follow-up lung nodule.  EXAM: CT CHEST WITHOUT CONTRAST  TECHNIQUE: Multidetector CT imaging of the chest was performed following the standard protocol without IV contrast.  COMPARISON: CTA chest, abdomen, and pelvis 08/24/2018. Chest CT 12/28/2014.  FINDINGS: Cardiovascular: Thoracic aortic atherosclerosis without aneurysm. LAD and left circumflex coronary artery atherosclerosis. Normal heart size. No pericardial effusion. Prominent aortic valvular calcification.  Mediastinum/Nodes: No enlarged axillary, mediastinal, or hilar lymph nodes within limitations of noncontrast technique. Grossly unremarkable thyroid within limitations of streak artifact from right shoulder arthroplasty. Unremarkable esophagus.  Lungs/Pleura: Trace left pleural effusion. No pneumothorax. Mild centrilobular emphysema and bronchial wall thickening. Mild bibasilar scarring or fibrosis. Unchanged 5 mm basilar left lower lobe nodule (series 3, image 123). There is a small amount of subpleural reticulation and ground-glass density in the right upper lobe in the location of the 8 x 6 mm solid nodule on the  prior CTA, however no significant residual solid nodule remains (series 3, image 21 and series 6, image 54). No new suspicious nodule is identified.  Upper Abdomen: Atherosclerosis. Partially visualized parapelvic cysts in both kidneys.  Musculoskeletal: Right shoulder arthroplasty. Old lateral left seventh and eighth rib fractures. No suspicious osseous lesion.  IMPRESSION: 1. Interval resolution of right upper lobe nodule consistent with an inflammatory etiology. No additional follow-up needed. 2. No new lung nodule or acute abnormality in the chest. 3. Aortic Atherosclerosis (ICD10-I70.0) and Emphysema (ICD10-J43.9).   Electronically Signed By: Logan Bores M.D. On: 03/17/2019 09:26   STS Risk Score: Procedure: Isolated AVR  Risk of Mortality: 5.673%  Renal Failure: 1.734%  Permanent Stroke: 2.140%  Prolonged Ventilation: 10.176%  DSW Infection: 0.101%  Reoperation: 3.740%  Morbidity or Mortality: 15.411%  Short Length of Stay: 17.739%  Long Length of Stay: 9.374%   Impression:  This 85 year old woman has stage D, critical, symptomatic aortic stenosis with New York Heart Association class III symptoms of exertional fatigue and shortness of breath consistent with chronic diastolic congestive heart failure.  I have personally reviewed her 2D echocardiogram, cardiac catheterization, and CTA studies.  Her recent echocardiogram shows a severely calcified aortic valve with poor leaflet mobility and an increase in the mean gradient to 53 mmHg consistent with critical aortic stenosis.  Her left ventricular systolic function has remained normal.  Previous cardiac catheterization had shown a calcified 80% LAD stenosis which was successfully treated with atherectomy and stenting.  She remained on Plavix until recently.  She was diagnosed with a pulmonary embolism and DVT and was treated with Eliquis for the past 8 months.  Although she is 49 with severe COPD she was fairly  functional until the past several months and has been mainly limited by exertional fatigue and shortness of breath.  I think transcatheter aortic valve replacement is a reasonable option for trying to improve her symptoms and quality of life.  Her gated cardiac CTA shows anatomy suitable for transcatheter aortic valve replacement.  Her abdominal and pelvic CTA shows  adequate pelvic vascular anatomy to allow transfemoral insertion.  The patient and her daughter were counseled at length regarding treatment alternatives for management of severe symptomatic aortic stenosis. The risks and benefits of surgical intervention has been discussed in detail. Long-term prognosis with medical therapy was discussed. Alternative approaches such as conventional surgical aortic valve replacement, transcatheter aortic valve replacement, and palliative medical therapy were compared and contrasted at length. This discussion was placed in the context of the patient's own specific clinical presentation and past medical history. All of their questions have been addressed.   Following the decision to proceed with transcatheter aortic valve replacement, a discussion was held regarding what types of management strategies would be attempted intraoperatively in the event of life-threatening complications, including whether or not the patient would be considered a candidate for the use of cardiopulmonary bypass and/or conversion to open sternotomy for attempted surgical intervention.  At the age of 20 I would not consider her a candidate for emergent sternotomy to manage any intraoperative complications and the patient and her family are aware of that.  The patient has been advised of a variety of complications that might develop including but not limited to risks of death, stroke, paravalvular leak, aortic dissection or other major vascular complications, aortic annulus rupture, device embolization, cardiac rupture or perforation,  mitral regurgitation, acute myocardial infarction, arrhythmia, heart block or bradycardia requiring permanent pacemaker placement, congestive heart failure, respiratory failure, renal failure, pneumonia, infection, other late complications related to structural valve deterioration or migration, or other complications that might ultimately cause a temporary or permanent loss of functional independence or other long term morbidity. The patient provides full informed consent for the procedure as described and all questions were answered.     Plan:  Transfemoral transcatheter aortic valve replacement.   Gaye Pollack, MD

## 2019-03-29 NOTE — Interval H&P Note (Signed)
History and Physical Interval Note:  03/29/2019 11:39 AM  Jessica [name]  has presented today for surgery, with the diagnosis of Severe Aortic Stenosis.  The various methods of treatment have been discussed with the patient and family. After consideration of risks, benefits and other options for treatment, the patient has consented to  Procedure(s): TRANSCATHETER AORTIC VALVE REPLACEMENT, TRANSFEMORAL (N/A) TRANSESOPHAGEAL ECHOCARDIOGRAM (TEE) (N/A) as a surgical intervention.  The patient's history has been reviewed, patient examined, no change in status, stable for surgery.  I have reviewed the patient's chart and labs.  Questions were answered to the patient's satisfaction.     Shortness of breath: Yes.   If yes: with what activity?: any activity causing shortness of breath. Worse than previously noted?: No.  New edema, PND, orthopnea: No.  Recent decrease in activity i.e. more difficulty walking to mailbox, climbing stairs, etc: No.  Changes in sleeping i.e. need to utilize to sleep on more pillows, sitting up, etc: No.  Changes since last seen in pre-op visit: No.  BNP 1175 and CXR shows small bilateral pleural effusions consistent with acute on chronic diastolic congestive heart failure.  Gaye Pollack

## 2019-03-29 NOTE — Progress Notes (Signed)
Coughing nonproductive, cough is congested. Respiratory Therapy called to administer nebulizer treatment.

## 2019-03-29 NOTE — Op Note (Signed)
[facility]   MULTIDISCIPLINARY HEART VALVE TEAM   TAVR OPERATIVE NOTE   Date of Procedure:  03/29/2019  Preoperative Diagnosis: Severe Aortic Stenosis   Postoperative Diagnosis: Same   Procedure:    Transcatheter Aortic Valve Replacement - Percutaneous Right Transfemoral Approach  Edwards Sapien 3 THV (size 26 mm, model # 9600TFX, serial # [id number])   Co-Surgeons:  Gaye Pollack, MD and Lauree Chandler, MD   Anesthesiologist:  Lillia Abed, MD  Echocardiographer:  Sanda Klein, MD  Pre-operative Echo Findings:  Severe aortic stenosis  Normal left ventricular systolic function  Post-operative Echo Findings:  No paravalvular leak  Normal left ventricular systolic function   BRIEF CLINICAL NOTE AND INDICATIONS FOR SURGERY  This 85 year old woman has stage D, critical, symptomatic aortic stenosis with New York Heart Association class III symptoms of exertional fatigue and shortness of breath consistent with chronic diastolic congestive heart failure. I have personally reviewed her 2D echocardiogram, cardiac catheterization, and CTA studies. Her recent echocardiogram shows a severely calcified aortic valve with poor leaflet mobility and an increase in the mean gradient to 53 mmHg consistent with critical aortic stenosis. Her left ventricular systolic function has remained normal. Previous cardiac catheterization had shown a calcified 80% LAD stenosis which was successfully treated with atherectomy and stenting. She remained on Plavix until recently. She was diagnosed with a pulmonary embolism and DVT and was treated with Eliquis for the past 8 months. Although she is 77 with severe COPD she was fairly functional until the past several months and has been mainly limited by exertional fatigue and shortness of breath. I think transcatheter aortic valve replacement is a reasonable option for trying to improve her symptoms and quality of life. Her gated  cardiac CTA shows anatomy suitable for transcatheter aortic valve replacement. Her abdominal and pelvic CTA shows adequate pelvic vascular anatomy to allow transfemoral insertion.  The patientand her daughter werecounseled at length regarding treatment alternatives for management of severe symptomatic aortic stenosis. The risks and benefits of surgical intervention has been discussed in detail. Long-term prognosis with medical therapy was discussed. Alternative approaches such as conventional surgical aortic valve replacement, transcatheter aortic valve replacement, and palliative medical therapy were compared and contrasted at length. This discussion was placed in the context of the patient's own specific clinical presentation and past medical history. All of their questions havebeen addressed.   Following the decision to proceed with transcatheter aortic valve replacement, a discussion was held regarding what types of management strategies would be attempted intraoperatively in the event of life-threatening complications, including whether or not the patient would be considered a candidate for the use of cardiopulmonary bypass and/or conversion to open sternotomy for attempted surgical intervention.At the age of 7 I would not consider her a candidate for emergent sternotomy to manage any intraoperative complications and the patient and her family are aware of that.  The patient has been advised of a variety of complications that might develop including but not limited to risks of death, stroke, paravalvular leak, aortic dissection or other major vascular complications, aortic annulus rupture, device embolization, cardiac rupture or perforation, mitral regurgitation, acute myocardial infarction, arrhythmia, heart block or bradycardia requiring permanent pacemaker placement, congestive heart failure, respiratory failure, renal failure, pneumonia, infection, other late complications related to  structural valve deterioration or migration, or other complications that might ultimately cause a temporary or permanent loss of functional independence or other long term morbidity. The patient provides full informed consent for the procedure as described  and all questions were answered.    DETAILS OF THE OPERATIVE PROCEDURE  PREPARATION:    The patient is brought to the operating room on the above mentioned date and appropriate monitoring was established by the anesthesia team. The patient is placed in the supine position on the operating table.  Intravenous antibiotics are administered. The patient is monitored closely throughout the procedure under conscious sedation.    Baseline transthoracic echocardiogram was performed. The patient's chest, abdomen, both groins, and both lower extremities are prepared and draped in a sterile manner. A time out procedure is performed.   PERIPHERAL ACCESS:    Using the modified Seldinger technique, femoral arterial and venous access was obtained with placement of 6 Fr sheaths on the left side.  A pigtail diagnostic catheter was passed through the left arterial sheath under fluoroscopic guidance into the aortic root.  A temporary transvenous pacemaker catheter was passed through the left femoral venous sheath under fluoroscopic guidance into the right ventricle.  The pacemaker was tested to ensure stable lead placement and pacemaker capture. Aortic root angiography was performed in order to determine the optimal angiographic angle for valve deployment.   TRANSFEMORAL ACCESS:   Percutaneous transfemoral access and sheath placement was performed using ultrasound guidance.  The right common femoral artery was cannulated using a micropuncture needle and appropriate location was verified using hand injection angiogram.  A pair of Abbott Perclose percutaneous closure devices were placed and a 6 French sheath replaced into the femoral artery.  The patient was  heparinized systemically and ACT verified > 250 seconds.    A right Fr transfemoral E-sheath was introduced into the right common femoral artery after progressively dilating over an Amplatz superstiff wire. An AL-1 catheter was used to direct a straight-tip exchange length wire across the native aortic valve into the left ventricle. This was exchanged out for a pigtail catheter and position was confirmed in the LV apex. Simultaneous LV and Ao pressures were recorded.  The pigtail catheter was exchanged for an Amplatz Extra-stiff wire in the LV apex.   BALLOON AORTIC VALVULOPLASTY:   Not performed  TRANSCATHETER HEART VALVE DEPLOYMENT:   An Edwards Sapien 3 transcatheter heart valve (size 26 mm, model #9600TFX, serial #[id number]) was prepared and crimped per manufacturer's guidelines, and the proper orientation of the valve is confirmed on the [employer] delivery system. The valve was advanced through the introducer sheath using normal technique until in an appropriate position in the abdominal aorta beyond the sheath tip. The balloon was then retracted and using the fine-tuning wheel was centered on the valve. The valve was then advanced across the aortic arch using appropriate flexion of the catheter. The valve was carefully positioned across the aortic valve annulus. The Commander catheter was retracted using normal technique. Once final position of the valve has been confirmed by angiographic assessment, the valve is deployed while temporarily holding ventilation and during rapid ventricular pacing to maintain systolic blood pressure < 50 mmHg and pulse pressure < 10 mmHg. The balloon inflation is held for >3 seconds after reaching full deployment volume. Once the balloon has fully deflated the balloon is retracted into the ascending aorta and valve function is assessed using echocardiography. There is felt to be no paravalvular leak and no central aortic insufficiency.  The patient's hemodynamic  recovery following valve deployment is good.  The deployment balloon and guidewire are both removed.    PROCEDURE COMPLETION:   The sheath was removed and femoral artery closure performed.  Protamine was administered once femoral arterial repair was complete. The temporary pacemaker, pigtail catheters and femoral sheaths were removed with manual pressure used for hemostasis.  A Mynx femoral closure device was utilized following removal of the diagnostic sheath in the left femoral artery.  The patient tolerated the procedure well and is transported to the surgical intensive care in stable condition. There were no immediate intraoperative complications. All sponge instrument and needle counts are verified correct at completion of the operation.   No blood products were administered during the operation.  The patient received a total of 40 mL of intravenous contrast during the procedure.   Gaye Pollack, MD 03/29/2019 2:43 PM

## 2019-03-29 NOTE — Progress Notes (Signed)
Rt radial arterial line d/c'ed and manual pressure held X 10 minutes. Gauze and tegaderm dressing applied. Palpable rt radial. Site level 0

## 2019-03-29 NOTE — Progress Notes (Signed)
Pt received from PACU. Pt c/a/0x4. Pt has o2 via mask @ 6lpm. Bilateral groins soft supple. Vitals stable. CHG bath given. CCMD notified/telebox 09 applied. Will continue to monitor  Jerald Kief, RN

## 2019-03-29 NOTE — Progress Notes (Signed)
  [place] VALVE TEAM  Patient doing well s/p TAVR. She is hemodynamically stable. Groin sites stable. ECG with no high grade block. Hg down to 8.5. Will recheck tomorrow AM. Arterial line discontinued and transferred  to 4E. Plan for early ambulation after bedrest completed and hopeful discharge over the next 24-48 hours. Continue prednisone, benadryl and Pepcid for severe contrast dye allergy.   [name] PA-C  [facility]  Pager [phone]

## 2019-03-29 NOTE — Transfer of Care (Signed)
Immediate Anesthesia Transfer of Care Note  Patient: Jessica [name]  Procedure(s) Performed: TRANSCATHETER AORTIC VALVE REPLACEMENT, TRANSFEMORAL (N/A Chest) TRANSESOPHAGEAL ECHOCARDIOGRAM (TEE) (N/A )  Patient Location: PACU  Anesthesia Type:MAC  Level of Consciousness: awake, alert  and patient cooperative  Airway & Oxygen Therapy: Patient Spontanous Breathing and Patient connected to face mask oxygen  Post-op Assessment: Report given to RN and Post -op Vital signs reviewed and stable  Post vital signs: Reviewed and stable  Last Vitals:  Vitals Value Taken Time  BP 98/44 03/29/2019  2:16 PM  Temp 36.6 C 03/29/2019  2:13 PM  Pulse 70 03/29/2019  2:17 PM  Resp 21 03/29/2019  2:17 PM  SpO2 99 % 03/29/2019  2:17 PM  Vitals shown include unvalidated device data.  Last Pain:  Vitals:   03/29/19 1413  TempSrc: Temporal  PainSc:       Patients Stated Pain Goal: 2 (54/56/25 6389)  Complications: No apparent anesthesia complications

## 2019-03-29 NOTE — Telephone Encounter (Signed)
She should have a Xanax refill left from last Rx (02/01/19), can you please verify with her pharmacy.  Thanks, BJ

## 2019-03-29 NOTE — Progress Notes (Signed)
  Echocardiogram 2D Echocardiogram has been performed.  Jessica [name] 03/29/2019, 1:42 PM

## 2019-03-30 ENCOUNTER — Encounter (HOSPITAL_COMMUNITY): Payer: Self-pay | Admitting: Cardiovascular Disease

## 2019-03-30 ENCOUNTER — Other Ambulatory Visit: Payer: Self-pay | Admitting: Physician Assistant

## 2019-03-30 ENCOUNTER — Inpatient Hospital Stay (HOSPITAL_COMMUNITY): Payer: Medicare Other

## 2019-03-30 DIAGNOSIS — I350 Nonrheumatic aortic (valve) stenosis: Principal | ICD-10-CM

## 2019-03-30 DIAGNOSIS — Z952 Presence of prosthetic heart valve: Secondary | ICD-10-CM

## 2019-03-30 DIAGNOSIS — I5033 Acute on chronic diastolic (congestive) heart failure: Secondary | ICD-10-CM

## 2019-03-30 LAB — BASIC METABOLIC PANEL
Anion gap: 11 (ref 5–15)
BUN: 23 mg/dL (ref 8–23)
CO2: 26 mmol/L (ref 22–32)
Calcium: 9.3 mg/dL (ref 8.9–10.3)
Chloride: 99 mmol/L (ref 98–111)
Creatinine, Ser: 0.85 mg/dL (ref 0.44–1.00)
GFR calc Af Amer: >60 mL/min (ref >60)
GFR calc non Af Amer: 60 mL/min — ABNORMAL LOW (ref >60)
Glucose, Bld: 130 mg/dL — ABNORMAL HIGH (ref 70–99)
Potassium: 4.2 mmol/L (ref 3.5–5.1)
Sodium: 136 mmol/L (ref 135–145)

## 2019-03-30 LAB — CBC
HCT: 30.8 % — ABNORMAL LOW (ref 36.0–46.0)
Hemoglobin: 9.2 g/dL — ABNORMAL LOW (ref 12.0–15.0)
MCH: 27.1 pg (ref 26.0–34.0)
MCHC: 29.9 g/dL — ABNORMAL LOW (ref 30.0–36.0)
MCV: 90.6 fL (ref 80.0–100.0)
Platelets: 216 10*3/uL (ref 150–400)
RBC: 3.4 MIL/uL — ABNORMAL LOW (ref 3.87–5.11)
RDW: 16.2 % — ABNORMAL HIGH (ref 11.5–15.5)
WBC: 11.6 10*3/uL — ABNORMAL HIGH (ref 4.0–10.5)
nRBC: 0 % (ref 0.0–0.2)

## 2019-03-30 LAB — GLUCOSE, CAPILLARY: Glucose-Capillary: 151 mg/dL — ABNORMAL HIGH (ref 70–99)

## 2019-03-30 LAB — ECHOCARDIOGRAM LIMITED
Height: 64 in
Weight: 2179.91 oz

## 2019-03-30 LAB — MAGNESIUM: Magnesium: 1.8 mg/dL (ref 1.7–2.4)

## 2019-03-30 MED ORDER — APIXABAN 5 MG PO TABS
5.0000 mg | ORAL_TABLET | Freq: Two times a day (BID) | ORAL | Status: DC
  Administered 2019-03-30: 21:00:00 5 mg via ORAL
  Filled 2019-03-30: qty 1

## 2019-03-30 MED ORDER — FUROSEMIDE 10 MG/ML IJ SOLN
20.0000 mg | Freq: Once | INTRAMUSCULAR | Status: AC
  Administered 2019-03-30: 12:00:00 20 mg via INTRAVENOUS
  Filled 2019-03-30: qty 2

## 2019-03-30 NOTE — Progress Notes (Signed)
CARDIAC REHAB PHASE I   PRE:  Rate/Rhythm: 90 SR    BP: sitting 129/61    SaO2: 94 RA, 88-91 RA EOB  MODE:  Ambulation: 80 ft   POST:  Rate/Rhythm: 89 SR    BP: sitting 141/76     SaO2: 96 2L  Pt with slight confusion. Can answer some things appropriately however also sts her finger is in the trash can and asked [name] to get it. Agreeable to ambulate. Needed mod assist to move to EOB and stand. Her SAO2 on RA was 88-91 on EOB with talking so reapplied O2. She is a mouth breather. She also worked hard to cough up phlegm, not ever productive. Posterior lean initially once standing.  Able to walk to sink, wash her hands, then ambulate in hall with RW and assist x2 and 2L O2. Very slow pace, mainly due to talking and distractions. She did increase her pace once she saw the room on return. To recliner on chair alarm. She has call bell however not sure she will use it. She will need 24 hr care at home, notified RN. Will f/u tomorrow. [place], ACSM 03/30/2019 12:17 PM

## 2019-03-30 NOTE — Progress Notes (Signed)
Spoke with patient daughter and updated her all questions answered at this time. Sharene Krikorian, Bettina Gavia rN

## 2019-03-30 NOTE — Progress Notes (Addendum)
Jessica [name]  Patient Name: Jessica [name] [facility] Date of Encounter: 03/30/2019  Primary Cardiologist: Dr. [name] / Dr. [name] & Dr. Cyndia Bent (TAVR)  Hospital Problem List     Principal Problem:   S/P TAVR (transcatheter aortic valve replacement) Active Problems:   Hyperlipidemia   Fibromyalgia   COPD (chronic obstructive pulmonary disease) (HCC)   Aortic atherosclerosis (HCC)   Severe aortic stenosis   History of pulmonary embolism   Chronic respiratory failure (HCC)   CAD (coronary artery disease)   Acute on chronic diastolic (congestive) heart failure (HCC)    Subjective   No complaints. Breathing improved.   Inpatient Medications    Scheduled Meds: . arformoterol  15 mcg Nebulization BID  . budesonide  0.5 mg Nebulization BID  . diphenhydrAMINE  25 mg Oral Q6H  . escitalopram  20 mg Oral QHS  . fluticasone  2 spray Each Nare Daily  . predniSONE  50 mg Oral Q6H  . simvastatin  20 mg Oral QHS  . sodium chloride flush  3 mL Intravenous Q12H   Continuous Infusions: . sodium chloride    . cefUROXime (ZINACEF)  IV Stopped (03/30/19 0245)  . nitroGLYCERIN    . phenylephrine (NEO-SYNEPHRINE) Adult infusion     PRN Meds: sodium chloride, acetaminophen **OR** acetaminophen, albuterol, ALPRAZolam, morphine injection, ondansetron (ZOFRAN) IV, oxyCODONE, sodium chloride flush, traMADol, traZODone   Vital Signs    Vitals:   03/30/19 0311 03/30/19 0344 03/30/19 0431 03/30/19 0906  BP: (!) 115/56  117/60   Pulse: 75 72 74   Resp: 15 17 (!) 23   Temp: 97.6 F (36.4 C)  97.6 F (36.4 C)   TempSrc: Oral  Oral   SpO2: 96% 99% 100% 93%  Weight:  61.8 kg    Height:        Intake/Output Summary (Last 24 hours) at 03/30/2019 1040 Last data filed at 03/30/2019 2025 Gross per 24 hour  Intake 893.8 ml  Output 100 ml  Net 793.8 ml   Filed Weights   03/29/19 2052 03/30/19 0344  Weight: 60.8 kg 61.8 kg    Physical Exam    GEN: Well nourished, well developed, in no acute distress. Elderly and frail appeaing HEENT: Grossly normal.  Neck: Supple, no JVD, carotid bruits, or masses. Cardiac: RRR, no murmurs, rubs, or gallops. No clubbing, cyanosis. Trace pretibial edema Radials/DP/PT 2+ and equal bilaterally.  Respiratory:  Course breath sounds GI: Soft, nontender, nondistended, BS + x 4. MS: no deformity or atrophy. Skin: warm and dry, no rash.  Neuro:  Strength and sensation are intact. Psych: AAOx3.  Normal affect.  Labs    CBC Recent Labs    03/29/19 1135  03/29/19 1424 03/30/19 0318  WBC 5.2  --   --  11.6*  HGB 10.5*   < > 8.5* 9.2*  HCT 35.2*   < > 25.0* 30.8*  MCV 91.0  --   --  90.6  PLT 265  --   --  216   < > = values in this interval not displayed.   Basic Metabolic Panel Recent Labs    03/29/19 1348 03/29/19 1424 03/29/19 1430 03/30/19 0318  NA 135 135  --  136  K 4.7 4.7  --  4.2  CL  --   --   --  99  CO2  --   --   --  26  GLUCOSE 125*  --   --  130*  BUN  --   --   --  23  CREATININE  --   --  0.70 0.85  CALCIUM  --   --   --  9.3  MG  --   --   --  1.8   Liver Function Tests No results for input(s): AST, ALT, ALKPHOS, BILITOT, PROT, ALBUMIN in the last 72 hours. No results for input(s): LIPASE, AMYLASE in the last 72 hours. Cardiac Enzymes No results for input(s): CKTOTAL, CKMB, CKMBINDEX, TROPONINI in the last 72 hours. BNP Invalid input(s): POCBNP D-Dimer No results for input(s): DDIMER in the last 72 hours. Hemoglobin A1C No results for input(s): HGBA1C in the last 72 hours. Fasting Lipid Panel No results for input(s): CHOL, HDL, LDLCALC, TRIG, CHOLHDL, LDLDIRECT in the last 72 hours. Thyroid Function Tests No results for input(s): TSH, T4TOTAL, T3FREE, THYROIDAB in the last 72 hours.  Invalid input(s): FREET3  Telemetry     sinus - Personally Reviewed  ECG    Sinus 73 bpm - Personally Reviewed  Radiology    Dg Chest Port 1 View  Result Date:  03/29/2019 CLINICAL DATA:  Aortic valve replacement. EXAM: PORTABLE CHEST 1 VIEW COMPARISON:  03/25/2019. FINDINGS: Prior aortic valve repair. Cardiomegaly with mild pulmonary venous congestion. Low lung volumes with mild bibasilar atelectasis, improved from prior exam. Tiny bilateral pleural effusions, improved from prior exam. Total right shoulder replacement. Old right clavicular fracture. Prior cervical spine fusion. IMPRESSION: 1. Prior aortic valve repair. Cardiomegaly with mild pulmonary venous congestion. 2. Low lung volumes with mild bibasilar atelectasis, improved from prior exam. Tiny bilateral pleural effusions, improved from prior exam. Electronically Signed   By: [place]   On: 03/29/2019 17:32    Cardiac Studies   TAVR OPERATIVE NOTE   Date of Procedure:                03/29/2019  Preoperative Diagnosis:      Severe Aortic Stenosis   Postoperative Diagnosis:    Same   Procedure:        Transcatheter Aortic Valve Replacement - Percutaneous Right Transfemoral Approach             Edwards Sapien 3 THV (size 26 mm, model # 9600TFX, serial # [id number])              Co-Surgeons:                        Gaye Pollack, MD and Lauree Chandler, MD   Anesthesiologist:                  Lillia Abed, MD  Echocardiographer:              Sanda Klein, MD  Pre-operative Echo Findings: ? Severe aortic stenosis ? Normal left ventricular systolic function  Post-operative Echo Findings: ? No paravalvular leak ? Normal left ventricular systolic function   _______________   Echo 03/30/2019: pending formal read   Patient Profile     Jessica [name] is a 85 y.o. female with a history of COPD on home 02, CAD s/p DES to LAD (06/2018), HLD, DVT/PE on Eliquis, severe contrast dye allergy, and severe AS who presented to [facility] on 03/29/19 for planned TAVR.  Assessment & Plan    Severe AS: s/p successful TAVR with a 26 mm Edwards Sapien 3 THV via the TF approach on  03/29/19. Post operative echo pending. Groin sites are stable. ECG with NSR and  no high grade heart block. Continue Eliquis alone given anemia.   Acute on chronic diastolic CHF: as evidenced by an elevated BNP >1000 and pulmonary vascular congestion on chest x ray. Plan to treat with 20mg  IV lasix today.   COPD: continue continuous 02 and home meds.   Severe contrast dye allergy: no rash noted. Continue prednisone/benadryl/pepcid  Hx of DVT/PE: continue Eliquis   Anemia: hg has remained stable ~9.2  Signed, [name], PA-C  03/30/2019, 10:40 AM  Pager [phone]  I have personally seen and examined this patient. I agree with the assessment and plan as outlined above.  Stable this am. Groins soft. No hematoma. Echo to assess valve. Mild volume overload. IV Lasix today. Anticipate d/c home tomorrow. Continue Eliquis alone. No ASA or Plavix given advanced age with anemia and increased bleeding risk.   Lauree Chandler 03/30/2019 11:11 AM'

## 2019-03-30 NOTE — Progress Notes (Signed)
  Echocardiogram 2D Echocardiogram has been performed.  Jessica [name] 03/30/2019, 11:39 AM

## 2019-03-30 NOTE — Progress Notes (Signed)
Spoke with patients daughter this am and updated her on patient her on [place]. All questions answered. Will monitor patient. Tianni Escamilla, Bettina Gavia rN

## 2019-03-31 ENCOUNTER — Other Ambulatory Visit (HOSPITAL_COMMUNITY): Payer: Self-pay

## 2019-03-31 LAB — CBC
HCT: 24.6 % — ABNORMAL LOW (ref 36.0–46.0)
Hemoglobin: 7.5 g/dL — ABNORMAL LOW (ref 12.0–15.0)
MCH: 27.1 pg (ref 26.0–34.0)
MCHC: 30.5 g/dL (ref 30.0–36.0)
MCV: 88.8 fL (ref 80.0–100.0)
Platelets: 200 10*3/uL (ref 150–400)
RBC: 2.77 MIL/uL — ABNORMAL LOW (ref 3.87–5.11)
RDW: 16.6 % — ABNORMAL HIGH (ref 11.5–15.5)
WBC: 10.7 10*3/uL — ABNORMAL HIGH (ref 4.0–10.5)
nRBC: 0 % (ref 0.0–0.2)

## 2019-03-31 LAB — VITAMIN B12: Vitamin B-12: 136 pg/mL — ABNORMAL LOW (ref 180–914)

## 2019-03-31 LAB — IRON AND TIBC
Iron: 13 ug/dL — ABNORMAL LOW (ref 28–170)
Saturation Ratios: 4 % — ABNORMAL LOW (ref 10.4–31.8)
TIBC: 335 ug/dL (ref 250–450)
UIBC: 322 ug/dL

## 2019-03-31 LAB — BASIC METABOLIC PANEL
Anion gap: 8 (ref 5–15)
BUN: 25 mg/dL — ABNORMAL HIGH (ref 8–23)
CO2: 29 mmol/L (ref 22–32)
Calcium: 9.1 mg/dL (ref 8.9–10.3)
Chloride: 100 mmol/L (ref 98–111)
Creatinine, Ser: 1 mg/dL (ref 0.44–1.00)
GFR calc Af Amer: 57 mL/min — ABNORMAL LOW (ref >60)
GFR calc non Af Amer: 49 mL/min — ABNORMAL LOW (ref >60)
Glucose, Bld: 121 mg/dL — ABNORMAL HIGH (ref 70–99)
Potassium: 3.9 mmol/L (ref 3.5–5.1)
Sodium: 137 mmol/L (ref 135–145)

## 2019-03-31 LAB — RETICULOCYTES
Immature Retic Fract: 20.7 % — ABNORMAL HIGH (ref 2.3–15.9)
RBC.: 2.75 MIL/uL — ABNORMAL LOW (ref 3.87–5.11)
Retic Count, Absolute: 45.4 10*3/uL (ref 19.0–186.0)
Retic Ct Pct: 1.7 % (ref 0.4–3.1)

## 2019-03-31 LAB — FOLATE: Folate: 5 ng/mL — ABNORMAL LOW (ref >5.9)

## 2019-03-31 LAB — FERRITIN: Ferritin: 49 ng/mL (ref 11–307)

## 2019-03-31 MED ORDER — FUROSEMIDE 20 MG PO TABS
20.0000 mg | ORAL_TABLET | Freq: Once | ORAL | Status: AC
  Administered 2019-03-31: 10:00:00 20 mg via ORAL
  Filled 2019-03-31: qty 1

## 2019-03-31 NOTE — Progress Notes (Signed)
Called Pt's daughter, Davy Pique, and information updated, all questions were answered. Pt was stable, no distress had seen. Continue to monitor.  Kennyth Lose, RN

## 2019-03-31 NOTE — Progress Notes (Addendum)
[place] VALVE TEAM  Patient Name: Jessica [name] [facility] Date of Encounter: 03/31/2019  Primary Cardiologist: Dr. [name] / Dr. [name] & Dr. Cyndia Bent (TAVR)  Hospital Problem List     Principal Problem:   S/P TAVR (transcatheter aortic valve replacement) Active Problems:   Hyperlipidemia   Fibromyalgia   COPD (chronic obstructive pulmonary disease) (HCC)   Aortic atherosclerosis (HCC)   Severe aortic stenosis   History of pulmonary embolism   Chronic respiratory failure (HCC)   CAD (coronary artery disease)   Acute on chronic diastolic (congestive) heart failure (HCC)    Subjective   Feeling good. Breathing better. No complaints  Inpatient Medications    Scheduled Meds: . apixaban  5 mg Oral BID  . arformoterol  15 mcg Nebulization BID  . budesonide  0.5 mg Nebulization BID  . escitalopram  20 mg Oral QHS  . fluticasone  2 spray Each Nare Daily  . simvastatin  20 mg Oral QHS  . sodium chloride flush  3 mL Intravenous Q12H   Continuous Infusions: . sodium chloride    . cefUROXime (ZINACEF)  IV 1.5 g (03/31/19 0051)  . nitroGLYCERIN    . phenylephrine (NEO-SYNEPHRINE) Adult infusion     PRN Meds: sodium chloride, acetaminophen **OR** acetaminophen, albuterol, ALPRAZolam, morphine injection, ondansetron (ZOFRAN) IV, oxyCODONE, sodium chloride flush, traMADol, traZODone   Vital Signs    Vitals:   03/30/19 2042 03/30/19 2200 03/31/19 0032 03/31/19 0455  BP: 139/66  (!) 127/96 (!) 101/42  Pulse: 86 88 87 81  Resp: 19 18 (!) 21 13  Temp:   98.3 F (36.8 C) 98 F (36.7 C)  TempSrc:   Oral Oral  SpO2:  98% 97% 96%  Weight:    62.4 kg  Height:        Intake/Output Summary (Last 24 hours) at 03/31/2019 0843 Last data filed at 03/31/2019 0500 Gross per 24 hour  Intake 450 ml  Output 200 ml  Net 250 ml   Filed Weights   03/29/19 2052 03/30/19 0344 03/31/19 0455  Weight: 60.8 kg 61.8 kg 62.4 kg    Physical Exam   GEN:  Well nourished, well developed, in no acute distress. Elderly and frail appeaing HEENT: Grossly normal.  Neck: Supple, no JVD, carotid bruits, or masses. Cardiac: RRR, no murmurs, rubs, or gallops. No clubbing, cyanosis. Trace pretibial edema Radials/DP/PT 2+ and equal bilaterally.  Respiratory:  Course breath sounds GI: Soft, nontender, nondistended, BS + x 4. MS: no deformity or atrophy. Skin: warm and dry, no rash.  Neuro:  Strength and sensation are intact. Psych: AAOx3.  Normal affect.  Labs    CBC Recent Labs    03/30/19 0318 03/31/19 0249  WBC 11.6* 10.7*  HGB 9.2* 7.5*  HCT 30.8* 24.6*  MCV 90.6 88.8  PLT 216 604   Basic Metabolic Panel Recent Labs    03/30/19 0318 03/31/19 0249  NA 136 137  K 4.2 3.9  CL 99 100  CO2 26 29  GLUCOSE 130* 121*  BUN 23 25*  CREATININE 0.85 1.00  CALCIUM 9.3 9.1  MG 1.8  --    Liver Function Tests No results for input(s): AST, ALT, ALKPHOS, BILITOT, PROT, ALBUMIN in the last 72 hours. No results for input(s): LIPASE, AMYLASE in the last 72 hours. Cardiac Enzymes No results for input(s): CKTOTAL, CKMB, CKMBINDEX, TROPONINI in the last 72 hours. BNP Invalid input(s): POCBNP D-Dimer No results for input(s): DDIMER in the last  72 hours. Hemoglobin A1C No results for input(s): HGBA1C in the last 72 hours. Fasting Lipid Panel No results for input(s): CHOL, HDL, LDLCALC, TRIG, CHOLHDL, LDLDIRECT in the last 72 hours. Thyroid Function Tests No results for input(s): TSH, T4TOTAL, T3FREE, THYROIDAB in the last 72 hours.  Invalid input(s): FREET3  Telemetry     sinus, rare PVC - Personally Reviewed  ECG    Sinus 73 bpm - Personally Reviewed  Radiology    Dg Chest Port 1 View  Result Date: 03/29/2019 CLINICAL DATA:  Aortic valve replacement. EXAM: PORTABLE CHEST 1 VIEW COMPARISON:  03/25/2019. FINDINGS: Prior aortic valve repair. Cardiomegaly with mild pulmonary venous congestion. Low lung volumes with mild bibasilar  atelectasis, improved from prior exam. Tiny bilateral pleural effusions, improved from prior exam. Total right shoulder replacement. Old right clavicular fracture. Prior cervical spine fusion. IMPRESSION: 1. Prior aortic valve repair. Cardiomegaly with mild pulmonary venous congestion. 2. Low lung volumes with mild bibasilar atelectasis, improved from prior exam. Tiny bilateral pleural effusions, improved from prior exam. Electronically Signed   By: [place]   On: 03/29/2019 17:32    Cardiac Studies   TAVR OPERATIVE NOTE   Date of Procedure:                03/29/2019  Preoperative Diagnosis:      Severe Aortic Stenosis   Postoperative Diagnosis:    Same   Procedure:        Transcatheter Aortic Valve Replacement - Percutaneous Right Transfemoral Approach             Edwards Sapien 3 THV (size 26 mm, model # 9600TFX, serial # [id number])              Co-Surgeons:                        Gaye Pollack, MD and Lauree Chandler, MD   Anesthesiologist:                  Lillia Abed, MD  Echocardiographer:              Sanda Klein, MD  Pre-operative Echo Findings: ? Severe aortic stenosis ? Normal left ventricular systolic function  Post-operative Echo Findings: ? No paravalvular leak ? Normal left ventricular systolic function   _______________   Echo 03/30/2019:  IMPRESSIONS  1. The left ventricle has low normal systolic function, with an ejection fraction of 50-55%. The cavity size was normal. There is moderately increased left ventricular wall thickness. Left ventricular diastolic function could not be evaluated due to  nondiagnostic images.  2. There is akinesis of the basal inferior wall.  3. The right ventricle has normal systolic function. The cavity was normal. There is no increase in right ventricular wall thickness. Right ventricular systolic pressure is moderately elevated with an estimated pressure of 39.6 mmHg.  4. The mitral valve is  degenerative. Mild thickening of the anterior mitral valve leaflet. Moderate calcification of the anterior mitral valve leaflet. There is moderate mitral annular calcification present. MV Mean grad: 4.0 mmHg,  5. - TAVR: S/P 28mm Edwards Sapien TAVR which is functioning normally with no perivalvular AI. The mean AVG is 9.8mmHg, Dimensionless index 0.53, AVA (VTI) 1.67cm2 and AV max 208 cm/s.  6. The interatrial septum appears to be lipomatous.  7. The inferior vena cava was dilated in size with <50% respiratory variability.  8. Moderate pleural effusion in the left lateral region.  Patient Profile     MOLINA HOLLENBACK is a 85 y.o. female with a history of COPD on home 02, CAD s/p DES to LAD (06/2018), HLD, DVT/PE on Eliquis, severe contrast dye allergy, and severe AS who presented to [facility] on 03/29/19 for planned TAVR.  Assessment & Plan    Severe AS: s/p successful TAVR with a 26 mm Edwards Sapien 3 THV via the TF approach on 03/29/19. Post operative echo showed EF 50%, akinesis of basal inf wall, normally funcitoning TAVR with mean gradient 9.5 mm Hg and no PVL. Groin sites are stable. ECG with NSR and no high grade heart block. Given worsening anemia and high risk for bleeding, plan to stop eliquis and start only a baby aspirin.   Acute on chronic diastolic CHF: as evidenced by an elevated BNP >1000 and pulmonary vascular congestion on chest x ray. Treated with one dose of IV lasix. Echo yesterday showed moderate pleural effusion. Will give her lasix 20 po today.   COPD: continue continuous 02 and home meds.   Severe contrast dye allergy: no rash noted. Continue prednisone/benadryl/pepcid  Hx of DVT/PE: she has been treated with eliquis. Given worsening anemia we will plan to stop this now and start a baby aspirin.   Anemia: hg down to 7.5 today. Her groin sites look good with no evidence of hematoma. We will keep her another day and trend hemoglobin. Will also get an anemia blood panel.    SignedAngelena Form, PA-C  03/31/2019, 8:43 AM  Pager [phone]  I have personally seen and examined this patient. I agree with the assessment and plan as outlined above.  She is doing well post TAVR. She is more anemic. No active bleeding. Will stop Eliquis. Start ASA. Monitor today. Repeat CBC in am. Likely d/c home tomorrow.   Lauree Chandler 03/31/2019 9:33 AM

## 2019-03-31 NOTE — Progress Notes (Signed)
CARDIAC REHAB PHASE I   PRE:  Rate/Rhythm: 100 SR  BP:  Supine:   Sitting: 121/63  Standing:    SaO2: 94% 2L   MODE:  Ambulation: 80 ft   POST:  Rate/Rhythm: 114 ST  BP:  Supine:   Sitting: 141/68  Standing:    SaO2: 89-92% 2L    100% 2L after rest 1015-1042 Pt coughed up productively. Pt walked 80 ft on 2L, rollator and gait belt use and asst x 2. Pt SOB at end of walk. Encouraged pursed lip breathing. Pt sats at 89 to 92 after walk but came up to 100% with slow deep breaths and oxygen. To chair with alarm and call bell. Pt stated she only walks to bathroom at home. Not appropriate for CRP 2 referral due to mobility issues.   Graylon Good, RN BSN  03/31/2019 10:35 AM

## 2019-04-01 ENCOUNTER — Other Ambulatory Visit: Payer: Self-pay | Admitting: Physician Assistant

## 2019-04-01 DIAGNOSIS — I5032 Chronic diastolic (congestive) heart failure: Secondary | ICD-10-CM

## 2019-04-01 LAB — BASIC METABOLIC PANEL
Anion gap: 10 (ref 5–15)
BUN: 21 mg/dL (ref 8–23)
CO2: 28 mmol/L (ref 22–32)
Calcium: 9.1 mg/dL (ref 8.9–10.3)
Chloride: 102 mmol/L (ref 98–111)
Creatinine, Ser: 0.83 mg/dL (ref 0.44–1.00)
GFR calc Af Amer: >60 mL/min (ref >60)
GFR calc non Af Amer: >60 mL/min (ref >60)
Glucose, Bld: 86 mg/dL (ref 70–99)
Potassium: 3.6 mmol/L (ref 3.5–5.1)
Sodium: 140 mmol/L (ref 135–145)

## 2019-04-01 LAB — CBC
HCT: 27.4 % — ABNORMAL LOW (ref 36.0–46.0)
Hemoglobin: 8 g/dL — ABNORMAL LOW (ref 12.0–15.0)
MCH: 26.6 pg (ref 26.0–34.0)
MCHC: 29.2 g/dL — ABNORMAL LOW (ref 30.0–36.0)
MCV: 91 fL (ref 80.0–100.0)
Platelets: 199 10*3/uL (ref 150–400)
RBC: 3.01 MIL/uL — ABNORMAL LOW (ref 3.87–5.11)
RDW: 16.6 % — ABNORMAL HIGH (ref 11.5–15.5)
WBC: 6.4 10*3/uL (ref 4.0–10.5)
nRBC: 0 % (ref 0.0–0.2)

## 2019-04-01 MED ORDER — ASPIRIN 81 MG PO CHEW: 81.0000 mg | CHEWABLE_TABLET | Freq: Every day | ORAL | Status: DC

## 2019-04-01 MED ORDER — ASPIRIN EC 81 MG PO TBEC: 81.0000 mg | DELAYED_RELEASE_TABLET | Freq: Every day | ORAL | Status: DC

## 2019-04-01 MED ORDER — FERROUS SULFATE 325 (65 FE) MG PO TABS: 325.0000 mg | ORAL_TABLET | Freq: Every day | ORAL | 3 refills | Status: DC

## 2019-04-01 MED ORDER — FUROSEMIDE 20 MG PO TABS: 20.0000 mg | ORAL_TABLET | Freq: Every day | ORAL | 3 refills | Status: DC

## 2019-04-01 NOTE — Progress Notes (Signed)
Spoke with Pt's daughter, [name], information updated, all questions answered at beginning of night shift.  Pt's SPO2 82-88% with room air when she was sleeping.She had been getting  2 LPM of O2 NCL tonight, maintained SPO2 > 92-98%. She had strong productive cough with clear and thick sputum after breathing treatment with nebulizer.  Her hemonydamics remained stable. Continue to monitor.  Eusebio Me, PCCN-CMC,CSC

## 2019-04-01 NOTE — Discharge Instructions (Signed)
ACTIVITY AND EXERCISE °• Daily activity and exercise are an important part of your recovery. People recover at different rates depending on their general health and type of valve procedure. °• Most people recovering from TAVR feel better relatively quickly  °• No lifting, pushing, pulling more than 10 pounds (examples to avoid: groceries, vacuuming, gardening, golfing): °            - For one week with a procedure through the groin. °            - For six weeks for procedures through the chest wall or neck °NOTE: You will typically see one of our providers 7-14 days after your procedure to discuss WHEN TO RESUME the above activities.  °  °  °DRIVING °• Do not drive for until you are seen for follow up and cleared by a provider. Generally, we ask patient to not drive for 1 week after their procedure. °• If you have been told by your doctor in the past that you may not drive, you must talk with him/her before you begin driving again. °  °  °DRESSING °• Groin site: you may leave the clear dressing over the site for up to one week or until it falls off. °  °  °HYGIENE °• If you had a femoral (leg) procedure, you may take a shower when you return home. After the shower, pat the site dry. Do NOT use powder, oils or lotions in your groin area until the site has completely healed. °• If you had a chest procedure, you may shower when you return home unless specifically instructed not to by your discharging practitioner. °            - DO NOT scrub incision; pat dry with a towel °            - DO NOT apply any lotions, oils, powders to the incision °            - No tub baths / swimming for at least 2 weeks. °• If you notice any fevers, chills, increased pain, swelling, bleeding or pus, please contact your doctor. °  °ADDITIONAL INFORMATION °• If you are going to have an upcoming dental procedure, please contact our office as you will require antibiotics ahead of time to prevent infection on your heart valve.  ° ° °If you  have any questions or concerns you can call the structural heart phone during normal business hours 8am-4pm. If you have an urgent need after hours or weekends please call [phone] to talk to the on call provider for general cardiology. If you have an emergency that requires immediate attention, please call 911.  ° ° °After TAVR Checklist ° °Check  Test Description  ° Follow up appointment in 1-2 weeks  You will see our structural heart physician assistant, Jessica Carthel Castille. Your incision sites will be checked and you will be cleared to drive and resume all normal activities if you are doing well.    ° 1 month echo and follow up  You will have an echo to check on your new heart valve and be seen back in the office by Jessica [name]. Many times the echo is not read by your appointment time, but Jessica will call you later that day or the following day to report your results.  ° Follow up with your primary cardiologist You will need to be seen by your primary cardiologist in the following 3-6 months after your 1   month appointment in the valve clinic. Often times your Plavix or Aspirin will be discontinued during this time, but this is decided on a case by case basis.    1 year echo and follow up You will have another echo to check on your heart valve after 1 year and be seen back in the office by Nell Range. This your last structural heart visit.   Bacterial endocarditis prophylaxis  You will have to take antibiotics for the rest of your life before all dental procedures (even teeth cleanings) to protect your heart valve. Antibiotics are also required before some surgeries. Please check with your cardiologist before scheduling any surgeries. Also, please make sure to tell us if you have a penicillin allergy as you will require an alternative antibiotic.       YOUR CARDIOLOGY TEAM HAS ARRANGED FOR AN E-VISIT FOR YOUR APPOINTMENT - PLEASE REVIEW IMPORTANT INFORMATION BELOW SEVERAL DAYS PRIOR TO YOUR  APPOINTMENT  Due to the recent COVID-19 pandemic, we are transitioning in-person office visits to tele-medicine visits in an effort to decrease unnecessary exposure to our patients, their families, and staff. These visits are billed to your insurance just like a normal visit is. We also encourage you to sign up for MyChart if you have not already done so. You will need a smartphone if possible. For patients that do not have this, we can still complete the visit using a regular telephone but do prefer a smartphone to enable video when possible. You may have a family member that lives with you that can help. If possible, we also ask that you have a blood pressure cuff and scale at home to measure your blood pressure, heart rate and weight prior to your scheduled appointment. Patients with clinical needs that need an in-person evaluation and testing will still be able to come to the office if absolutely necessary. If you have any questions, feel free to call our office.    YOUR PROVIDER WILL BE USING THE FOLLOWING PLATFORM TO COMPLETE YOUR VISIT: [email or web address] All you need is a smart phone. You will receive a text message from the provider through the [email or web address] app. You will follow the prompts and it will take you to a virtual visit with your provider. Please check your blood pressure, heart rate and weight prior to your scheduled appointment.  CONSENT FOR TELE-HEALTH VISIT - PLEASE REVIEW  I hereby voluntarily request, consent and authorize [facility] and its employed or contracted physicians, physician assistants, nurse practitioners or other licensed health care professionals (the Practitioner), to provide me with telemedicine health care services (the Services") as deemed necessary by the treating Practitioner. I acknowledge and consent to receive the Services by the Practitioner via telemedicine. I understand that the telemedicine visit will involve communicating with the Practitioner through live  audiovisual communication technology and the disclosure of certain medical information by electronic transmission. I acknowledge that I have been given the opportunity to request an in-person assessment or other available alternative prior to the telemedicine visit and am voluntarily participating in the telemedicine visit.  I understand that I have the right to withhold or withdraw my consent to the use of telemedicine in the course of my care at any time, without affecting my right to future care or treatment, and that the Practitioner or I may terminate the telemedicine visit at any time. I understand that I have the right to inspect all information obtained and/or recorded in the course of the telemedicine visit and may receive  copies of available information for a reasonable fee.  I understand that some of the potential risks of receiving the Services via telemedicine include:   Delay or interruption in medical evaluation due to technological equipment failure or disruption;  Information transmitted may not be sufficient (e.g. poor resolution of images) to allow for appropriate medical decision making by the Practitioner; and/or   In rare instances, security protocols could fail, causing a breach of personal health information.  Furthermore, I acknowledge that it is my responsibility to provide information about my medical history, conditions and care that is complete and accurate to the best of my ability. I acknowledge that Practitioner's advice, recommendations, and/or decision may be based on factors not within their control, such as incomplete or inaccurate data provided by me or distortions of diagnostic images or specimens that may result from electronic transmissions. I understand that the practice of medicine is not an exact science and that Practitioner makes no warranties or guarantees regarding treatment outcomes. I acknowledge that I will receive a copy of this consent concurrently upon  execution via email to the email address I last provided but may also request a printed copy by calling the office of [facility].    I understand that my insurance will be billed for this visit.   I have read or had this consent read to me.  I understand the contents of this consent, which adequately explains the benefits and risks of the Services being provided via telemedicine.   I have been provided ample opportunity to ask questions regarding this consent and the Services and have had my questions answered to my satisfaction.  I give my informed consent for the services to be provided through the use of telemedicine in my medical care  By participating in this telemedicine visit I agree to the above.

## 2019-04-01 NOTE — Progress Notes (Signed)
CCMD called to notify Pt had 11 beats of short run V-tach. She was asymptomatically lying on her bed. Continue to monitor.  Kennyth Lose, RN

## 2019-04-01 NOTE — Consult Note (Signed)
   [facility] Inpatient Consult   04/01/2019  Suzzette Gasparro [facility] 1926-02-11 [id number]  Patient was assessed for [facility] for community services. Patient was previously active with [facility].   An active consent form signed is on file.  Chart reviewed for post hospital follow up needs.  Patient had already transitioned home.    Of note, [facility] Care Management services does not replace or interfere with any services that are arranged by inpatient case management or social work.   For additional questions or referrals please contact:  Natividad Brood, RN BSN [facility] Liaison  [phone] business mobile phone Toll free office [phone]  Fax number: [phone] [email or web address]

## 2019-04-01 NOTE — Discharge Summary (Addendum)
[place] VALVE TEAM  Discharge Summary    Patient ID: Jessica [name] MRN: [id number]; DOB: 06-01-1926  Admit date: 03/29/2019 Discharge date: 04/01/2019  Primary Care Provider: [name], [name], MD  Primary Cardiologist: [name], MD / Dr. [name] & Dr. Cyndia Bent (TAVR)  Discharge Diagnoses    Principal Problem:   S/P TAVR (transcatheter aortic valve replacement) Active Problems:   Hyperlipidemia   Fibromyalgia   COPD (chronic obstructive pulmonary disease) (HCC)   Aortic atherosclerosis (HCC)   Severe aortic stenosis   History of pulmonary embolism   Chronic respiratory failure (HCC)   CAD (coronary artery disease)   Acute on chronic diastolic (congestive) heart failure (HCC)   Allergies Allergies  Allergen Reactions  . Contrast Media [Iodinated Diagnostic Agents] Itching, Rash and Other (See Comments)    Hypotension, Skin turns red like a sunburn  . Iohexol Itching, Rash and Other (See Comments)    Hypotension, Skin turns red like a sunburn    Diagnostic Studies/Procedures    TAVR OPERATIVE NOTE   Date of Procedure:03/29/2019  Preoperative Diagnosis:Severe Aortic Stenosis   Postoperative Diagnosis:Same   Procedure:   Transcatheter Aortic Valve Replacement - PercutaneousRightTransfemoral Approach Edwards Sapien 3 THV (size 66mm, model # 9600TFX, serial # [id number])  Co-Surgeons:Bryan Alveria Apley, MD and Lauree Chandler, MD   Anesthesiologist:Kevin Conrad Klawock, MD  Echocardiographer:Mihai Croitoru, MD  Pre-operative Echo Findings: ? Severe aortic stenosis ? Normalleft ventricular systolic function  Post-operative Echo Findings: ? Noparavalvular leak ? Normalleft ventricular systolic function   _______________   Echo 03/30/2019:  IMPRESSIONS 1. The left ventricle has low normal systolic  function, with an ejection fraction of 50-55%. The cavity size was normal. There is moderately increased left ventricular wall thickness. Left ventricular diastolic function could not be evaluated due to  nondiagnostic images. 2. There is akinesis of the basal inferior wall. 3. The right ventricle has normal systolic function. The cavity was normal. There is no increase in right ventricular wall thickness. Right ventricular systolic pressure is moderately elevated with an estimated pressure of 39.6 mmHg. 4. The mitral valve is degenerative. Mild thickening of the anterior mitral valve leaflet. Moderate calcification of the anterior mitral valve leaflet. There is moderate mitral annular calcification present. MV Mean grad: 4.0 mmHg, 5. - TAVR: S/P 105mm Edwards Sapien TAVR which is functioning normally with no perivalvular AI. The mean AVG is 9.65mmHg, Dimensionless index 0.53, AVA (VTI) 1.67cm2 and AV max 208 cm/s. 6. The interatrial septum appears to be lipomatous. 7. The inferior vena cava was dilated in size with <50% respiratory variability. 8. Moderate pleural effusion in the left lateral region.   History of Present Illness     Jessica [name] is a 85 y.o. female with a history of COPD on home 02, CAD s/p DES to LAD (06/2018), HLD, DVT/PE on Eliquis, severe contrast dye allergy, and severe AS who presented to [facility] on 03/29/19 for planned TAVR.  The patient has known severe aortic stenosis who was worked up for consideration of TAVR in September 2019 after presenting with shortness of breath.  Her cardiac catheterization on 07/07/2018 showed a heavily calcified 80% proximal LAD stenosis and she underwent atherectomy and stenting on 07/23/2018.  She was premedicated with steroids and Benadryl due to previous known allergy to intravenous contrast.  Despite this she developed a delayed contrast reaction resulting in pruritus, generalized edema, and hypotension.  Due to her severe reaction she was  premedicated before and after her  CT scans and kept as an inpatient.  She did well with this with no significant reaction.  CT scan did show an incidental finding of an acute right lung pulmonary embolism and lower extremity Doppler showed right leg DVT.  She was started on Eliquis and continued on Plavix for 6 months before considering TAVR.  She was admitted in February 2020 with pneumonia and likely COPD exacerbation with acute congestive heart failure.  She was seen back in the emergency room in March with orthostasis and urinary tract infection. She was seen in a telehealth visit on 02/10/2019 by our PA Nell Range and still had some shortness of breath with exertion but was fairly stable.  Her Plavix was discontinued since it has been 6 months since her PCI with plans for follow-up.  She had a follow-up echocardiogram on 03/17/2019 which showed an increase in the mean gradient across aortic valve to 53 mmHg with a peak gradient of 73 mmHg.  Aortic valve area was 0.58 cm.  Left ventricular ejection fraction was 60 to 65%.  The patient has been evaluated by the multidisciplinary valve team and felt to have severe, symptomatic aortic stenosis and to be a suitable candidate for TAVR, which was set up for 03/30/2019.    Hospital Course     Consultants: none  Severe AS: s/p successful TAVR with a 26 mm Edwards Sapien 3 THV via the TF approach on 03/29/19. Post operative echo showed EF 50%, akinesis of basal inf wall, normally funcitoning TAVR with mean gradient 9.5 mm Hg and no PVL. Groin sites are stable. ECG with NSR and no high grade heart block. Given worsening anemia and high risk for bleeding, plan to stop eliquis and start only a baby aspirin.   Acute on chronic diastolic CHF: as evidenced by an elevated BNP >1000 and pulmonary vascular congestion and plural effusions on chest x ray and echo. She was treated with IV lasix during admission. Plan to discharge her on 20mg  lasix daily. Will check BMET  when she comes into office for echo  COPD: continue continuous 02 and home meds.   Severe contrast dye allergy: no rash noted. She was treated with prednisone/benadryl/pepcid before and after her surgery.   Hx of DVT/PE: she has been treated with eliquis. Given worsening anemia we will plan to stop this now and start a baby aspirin.   Acute on chronic anemia: hg dipped down to 7.5 today. Back up to 8 today. Her groin sites look good with no evidence of hematoma. Given worsening anemia and high risk of bleed, her Eliquis was discontinued. Anemia panel shows low iron, will plan to start an iron supplement and have her follow up with her PCP, Dr. [name]  NSVT: 9 beats noted on tele. Asymptomatic. Resume home BB.   _____________  Discharge Vitals Blood pressure (!) 125/57, pulse 70, temperature 97.7 F (36.5 C), temperature source Oral, resp. rate 12, height 5\' 4"  (1.626 m), weight 69 kg, SpO2 98 %.  Filed Weights   03/30/19 0344 03/31/19 0455 04/01/19 0500  Weight: 61.8 kg 62.4 kg 69 kg   GEN: Well nourished, well developed, in no acute distress, elderly and frail HEENT: normal Neck: no JVD or masses Cardiac: RRR; no murmurs, rubs, or gallops,no edema  Respiratory:  clear to auscultation bilaterally, normal work of breathing GI: soft, nontender, nondistended, + BS MS: no deformity or atrophy Skin: warm and dry, no rash.  Groin sites clear without hematoma or ecchymosis  Neuro:  Alert  and Oriented x 3, Strength and sensation are intact Psych: euthymic mood, full affect   Labs & Radiologic Studies    CBC Recent Labs    03/31/19 0249 04/01/19 0316  WBC 10.7* 6.4  HGB 7.5* 8.0*  HCT 24.6* 27.4*  MCV 88.8 91.0  PLT 200 678   Basic Metabolic Panel Recent Labs    03/30/19 0318 03/31/19 0249 04/01/19 0316  NA 136 137 140  K 4.2 3.9 3.6  CL 99 100 102  CO2 26 29 28   GLUCOSE 130* 121* 86  BUN 23 25* 21  CREATININE 0.85 1.00 0.83  CALCIUM 9.3 9.1 9.1  MG 1.8  --   --     Liver Function Tests No results for input(s): AST, ALT, ALKPHOS, BILITOT, PROT, ALBUMIN in the last 72 hours. No results for input(s): LIPASE, AMYLASE in the last 72 hours. Cardiac Enzymes No results for input(s): CKTOTAL, CKMB, CKMBINDEX, TROPONINI in the last 72 hours. BNP Invalid input(s): POCBNP D-Dimer No results for input(s): DDIMER in the last 72 hours. Hemoglobin A1C No results for input(s): HGBA1C in the last 72 hours. Fasting Lipid Panel No results for input(s): CHOL, HDL, LDLCALC, TRIG, CHOLHDL, LDLDIRECT in the last 72 hours. Thyroid Function Tests No results for input(s): TSH, T4TOTAL, T3FREE, THYROIDAB in the last 72 hours.  Invalid input(s): FREET3 _____________  Dg Chest 2 View  Result Date: 03/25/2019 CLINICAL DATA:  Shortness of breath, cough EXAM: CHEST - 2 VIEW COMPARISON:  12/16/2018 FINDINGS: Small bilateral pleural effusions with bibasilar atelectasis. Mild cardiomegaly. No overt edema. No acute bony abnormality. Prior right shoulder replacement. IMPRESSION: Mild cardiomegaly. Small bilateral effusions with bibasilar atelectasis. Electronically Signed   By: Rolm Baptise M.D.   On: 03/25/2019 11:51   Ct Chest Wo Contrast  Result Date: 03/17/2019 CLINICAL DATA:  Follow-up lung nodule. EXAM: CT CHEST WITHOUT CONTRAST TECHNIQUE: Multidetector CT imaging of the chest was performed following the standard protocol without IV contrast. COMPARISON:  CTA chest, abdomen, and pelvis 08/24/2018. Chest CT 12/28/2014. FINDINGS: Cardiovascular: Thoracic aortic atherosclerosis without aneurysm. LAD and left circumflex coronary artery atherosclerosis. Normal heart size. No pericardial effusion. Prominent aortic valvular calcification. Mediastinum/Nodes: No enlarged axillary, mediastinal, or hilar lymph nodes within limitations of noncontrast technique. Grossly unremarkable thyroid within limitations of streak artifact from right shoulder arthroplasty. Unremarkable esophagus.  Lungs/Pleura: Trace left pleural effusion. No pneumothorax. Mild centrilobular emphysema and bronchial wall thickening. Mild bibasilar scarring or fibrosis. Unchanged 5 mm basilar left lower lobe nodule (series 3, image 123). There is a small amount of subpleural reticulation and ground-glass density in the right upper lobe in the location of the 8 x 6 mm solid nodule on the prior CTA, however no significant residual solid nodule remains (series 3, image 21 and series 6, image 54). No new suspicious nodule is identified. Upper Abdomen: Atherosclerosis. Partially visualized parapelvic cysts in both kidneys. Musculoskeletal: Right shoulder arthroplasty. Old lateral left seventh and eighth rib fractures. No suspicious osseous lesion. IMPRESSION: 1. Interval resolution of right upper lobe nodule consistent with an inflammatory etiology. No additional follow-up needed. 2. No new lung nodule or acute abnormality in the chest. 3. Aortic Atherosclerosis (ICD10-I70.0) and Emphysema (ICD10-J43.9). Electronically Signed   By: Logan Bores M.D.   On: 03/17/2019 09:26   Dg Chest Port 1 View  Result Date: 03/29/2019 CLINICAL DATA:  Aortic valve replacement. EXAM: PORTABLE CHEST 1 VIEW COMPARISON:  03/25/2019. FINDINGS: Prior aortic valve repair. Cardiomegaly with mild pulmonary venous congestion. Low lung volumes with  mild bibasilar atelectasis, improved from prior exam. Tiny bilateral pleural effusions, improved from prior exam. Total right shoulder replacement. Old right clavicular fracture. Prior cervical spine fusion. IMPRESSION: 1. Prior aortic valve repair. Cardiomegaly with mild pulmonary venous congestion. 2. Low lung volumes with mild bibasilar atelectasis, improved from prior exam. Tiny bilateral pleural effusions, improved from prior exam. Electronically Signed   By: [place]   On: 03/29/2019 17:32   Disposition   Pt is being discharged home today in good condition.  Follow-up Plans & Appointments     Follow-up Information    Eileen Stanford, PA-C. Go on 04/07/2019.   Specialties:  Cardiology, Radiology Why:  @ 2pm for a virtual visit, please review consent and information in discharge instructions Contact information: [name] [id number] [phone]            Discharge Medications   Allergies as of 04/01/2019      Reactions   Contrast Media [iodinated Diagnostic Agents] Itching, Rash, Other (See Comments)   Hypotension, Skin turns red like a sunburn   Iohexol Itching, Rash, Other (See Comments)   Hypotension, Skin turns red like a sunburn      Medication List    STOP taking these medications   Eliquis 5 MG Tabs tablet Generic drug:  apixaban   predniSONE 50 MG tablet Commonly known as:  DELTASONE     TAKE these medications   albuterol (2.5 MG/3ML) 0.083% nebulizer solution Commonly known as:  PROVENTIL Take 3 mLs (2.5 mg total) by nebulization every 4 (four) hours as needed for wheezing or shortness of breath. DX: COPD J44.9 What changed:  Another medication with the same name was changed. Make sure you understand how and when to take each.   Ventolin HFA 108 (90 Base) MCG/ACT inhaler Generic drug:  albuterol INHALE 2 PUFFS EVERY 6HRS AS NEEDED FOR WHEEZING OR SHORTNESS OF BREATH. DO NOT USE IF USING NEBS. What changed:  See the new instructions.   ALPRAZolam 0.5 MG tablet Commonly known as:  XANAX TAKE ONE TABLET BY MOUTH AT BEDTIME AS NEEDED FOR ANXIETY What changed:  See the new instructions.   AMBULATORY NON FORMULARY MEDICATION Medication Name: Incentive spirometer Use 10-15 times per day   arformoterol 15 MCG/2ML Nebu Commonly known as:  BROVANA Take 2 mLs (15 mcg total) by nebulization 2 (two) times daily. DX: COPD J44.9 What changed:  additional instructions   aspirin EC 81 MG tablet Take 1 tablet (81 mg total) by mouth daily.   benzonatate 200 MG capsule Commonly known as:  TESSALON Take 1 capsule (200  mg total) by mouth 3 (three) times daily as needed for cough.   budesonide 0.5 MG/2ML nebulizer solution Commonly known as:  PULMICORT Take 2 mLs (0.5 mg total) by nebulization 2 (two) times daily. DX: COPD J44.9   escitalopram 20 MG tablet Commonly known as:  LEXAPRO Take 1 tablet (20 mg total) by mouth daily. What changed:  when to take this   ferrous sulfate 325 (65 FE) MG tablet Take 1 tablet (325 mg total) by mouth daily.   fluticasone 50 MCG/ACT nasal spray Commonly known as:  FLONASE Place 2 sprays into both nostrils daily.   Flutter Devi 1 each by Does not apply route daily.   furosemide 20 MG tablet Commonly known as:  Lasix Take 1 tablet (20 mg total) by mouth daily.   guaiFENesin 600 MG 12 hr tablet Commonly known as:  MUCINEX Take 1  tablet (600 mg total) by mouth 2 (two) times daily.   loratadine 10 MG tablet Commonly known as:  CLARITIN Take 1 tablet (10 mg total) by mouth daily.   metoprolol tartrate 25 MG tablet Commonly known as:  LOPRESSOR Take 0.5 tablets (12.5 mg total) by mouth 2 (two) times daily.   pantoprazole 40 MG tablet Commonly known as:  PROTONIX Take 1 tablet (40 mg total) by mouth daily at 12 noon.   simvastatin 20 MG tablet Commonly known as:  ZOCOR Take 1 tablet (20 mg total) by mouth at bedtime.   traZODone 50 MG tablet Commonly known as:  DESYREL Take 0.5-1 tablets (25-50 mg total) by mouth at bedtime as needed for sleep.   triamcinolone cream 0.1 % Commonly known as:  KENALOG Apply 1 application topically 2 (two) times daily. What changed:    when to take this  reasons to take this        Outstanding Labs/Studies   BMET at time of echo  Duration of Discharge Encounter   Greater than 30 minutes including physician time.  SignedAngelena Form, PA-C 04/01/2019, 10:05 AM [phone]  I have personally seen and examined this patient. I agree with the assessment and plan as outlined above.  She is stable today.  Feel great. H/H stable.  Will d/c home today on ASA alone given anemia. She has received 9 months of AC for her DVT.  Lauree Chandler 04/01/2019 10:56 AM

## 2019-04-04 ENCOUNTER — Telehealth: Payer: Self-pay | Admitting: Physician Assistant

## 2019-04-04 NOTE — Telephone Encounter (Signed)
  [facility]   MULTIDISCIPLINARY HEART VALVE TEAM   Patient's daughter, [name], contacted regarding discharge from [facility] on 6/5. Ms. sambrano is doing quite well with a big improvement in her breathing.   Patient understands to follow up with provider Nell Range on 6/11 @ 2pm as a virtual visit.  Patient understands discharge instructions? yes Patient understands medications and regimen? yes Patient understands to bring all medications to this visit? yes  [name] PA-C  [facility]

## 2019-04-05 MED FILL — Heparin Sodium (Porcine) Inj 1000 Unit/ML: INTRAMUSCULAR | Qty: 30 | Status: AC

## 2019-04-05 MED FILL — Potassium Chloride Inj 2 mEq/ML: INTRAVENOUS | Qty: 40 | Status: AC

## 2019-04-05 MED FILL — Magnesium Sulfate Inj 50%: INTRAMUSCULAR | Qty: 10 | Status: AC

## 2019-04-07 ENCOUNTER — Other Ambulatory Visit: Payer: Self-pay | Admitting: Physician Assistant

## 2019-04-07 ENCOUNTER — Telehealth (INDEPENDENT_AMBULATORY_CARE_PROVIDER_SITE_OTHER): Payer: Medicare Other | Admitting: Physician Assistant

## 2019-04-07 ENCOUNTER — Other Ambulatory Visit: Payer: Self-pay

## 2019-04-07 ENCOUNTER — Encounter: Payer: Self-pay | Admitting: Physician Assistant

## 2019-04-07 VITALS — Ht 64.0 in | Wt 156.0 lb

## 2019-04-07 DIAGNOSIS — Z86711 Personal history of pulmonary embolism: Secondary | ICD-10-CM

## 2019-04-07 DIAGNOSIS — Z952 Presence of prosthetic heart valve: Secondary | ICD-10-CM

## 2019-04-07 DIAGNOSIS — I5032 Chronic diastolic (congestive) heart failure: Secondary | ICD-10-CM

## 2019-04-07 DIAGNOSIS — J449 Chronic obstructive pulmonary disease, unspecified: Secondary | ICD-10-CM

## 2019-04-07 DIAGNOSIS — D649 Anemia, unspecified: Secondary | ICD-10-CM

## 2019-04-07 NOTE — Patient Instructions (Signed)
Continue same medications and go to appointments scheduled in this paperwork.

## 2019-04-07 NOTE — Progress Notes (Signed)
[facility]   MULTIDISCIPLINARY HEART VALVE TEAM     Virtual Visit via Video Note   This visit type was conducted due to national recommendations for restrictions regarding the COVID-19 Pandemic (e.[name]. social distancing) in an effort to limit this patient's exposure and mitigate transmission in our community.  Due to her co-morbid illnesses, this patient is at least at moderate risk for complications without adequate follow up.  This format is felt to be most appropriate for this patient at this time.  All issues noted in this document were discussed and addressed.  A limited physical exam was performed with this format.  Please refer to the patient's chart for her consent to telehealth for [facility].   Evaluation Performed:  Follow-up visit  Date:  04/07/2019   ID:  Jessica [name], DOB 28-Jan-1926, MRN [id number]  Patient Location: Home Provider Location: Office  PCP:  [name], [name], MD  Cardiologist:  Jessica Rogue, MD / Dr. Angelena [name] & Dr. Cyndia [name] (TAVR) Electrophysiologist:  None   Chief Complaint:  TOC s/p TAVR   History of Present Illness:    Jessica [name] is a 85 y.o. female with a history of COPD on home 02, CAD s/p DES to LAD (06/2018), HLD, DVT/PE on Eliquis, severe contrast dye allergy, and severe AS s/p TAVR (03/29/19) who presents for follow up.   The patient does not have symptoms concerning for COVID-19 infection (fever, chills, cough, or new shortness of breath).   The patient has known severe aortic stenosis who was worked up for consideration of TAVR in September 2019 after presenting with shortness of breath. Her cardiac catheterization on 07/07/2018 showed a heavily calcified 80% proximal LAD stenosis and she underwent atherectomy and stenting on 07/23/2018. She was premedicated with steroids and Benadryl due to previous known allergy to intravenous contrast. Despite this she developed a delayed contrast reaction resulting in pruritus, generalized  edema, and hypotension. Due to her severe reaction she was premedicated before and after her CT scans and kept as an inpatient. She did well with this with no significant reaction. CT scan did show an incidental finding of an acute right lung pulmonary embolism and lower extremity Doppler showed right leg DVT. She was started on Eliquis and continued on Plavix for 6 months before considering TAVR. She was admitted in February 2020 with pneumonia and likely COPD exacerbation with acute congestive heart failure. She was seen back in the emergency room in March with orthostasis and urinary tract infection. She was seen in a telehealth visit on 02/10/2019 by our PA Jessica [name] and still had some shortness of breath with exertion but was fairly stable. Her Plavix was discontinued since it has been 6 months since her PCI with plans for follow-up. She had a follow-up echocardiogram on 03/17/2019 which showed an increase in the mean gradient across aortic valve to 53 mmHg with a peak gradient of 73 mmHg. Aortic valve area was 0.58 cm. Left ventricular ejection fraction was 60 to 65%.  She underwent successful TAVR with a 26 mm Edwards Sapien 3 THV via the TF approach on 03/29/19. Post operative echoshowed EF 50%, akinesis of basal inf wall, normally funcitoning TAVR with mean gradient 9.5 mm Hg and no PVL. She had worsening anemia down to 7.5. Eliqius was discontinued and she was started on a baby aspirin. She also had mild volume overload and treated with IV lasix. She was discharged on lasix 20mg  daily.   Today she presents for follow  up. Jessica [name] present for conversation. She has been doing much better in terms of her breathing. No CP. Her shortness of breath has improved greatly. No LE edema, orthopnea or PND. No dizziness or syncope. No blood in stool or urine. No palpitations.   Past Medical History:  Diagnosis Date   Anxiety    congenital nystagmus    COPD (chronic obstructive pulmonary disease)  (HCC)    Coronary artery disease    DJD (degenerative joint disease)    Fibromyalgia    Low back pain syndrome    Memory loss    Other and unspecified hyperlipidemia    Pancreatic lesion    Pneumonia    S/P TAVR (transcatheter aortic valve replacement)    26 mm Edwards Sapien 3 via the TF approach   Severe aortic stenosis    Venous insufficiency    Past Surgical History:  Procedure Laterality Date   ABDOMINAL HYSTERECTOMY     ANTERIOR CERVICAL DISCECTOMY     APPENDECTOMY     CARPAL TUNNEL RELEASE  12/2011   right arm   CATARACT EXTRACTION     CORONARY ATHERECTOMY  07/23/2018   PTCA/orbital atherectomy/DES x 1 proximal to mid LAD   CORONARY ATHERECTOMY N/A 07/23/2018   Procedure: CORONARY ATHERECTOMY;  Surgeon: Burnell Blanks, MD;  Location: [place] [facility];  Service: Cardiovascular;  Laterality: N/A;   CORONARY STENT INTERVENTION     CORONARY STENT INTERVENTION N/A 07/23/2018   Procedure: CORONARY STENT INTERVENTION;  Surgeon: Burnell Blanks, MD;  Location: [place] [facility];  Service: Cardiovascular;  Laterality: N/A;   EYE SURGERY     LUMBAR LAMINECTOMY     right knee arthroscopy     right shoulder replacement  04/2009   right shoulder surgery  2008   Dr. Noemi Chapel   RIGHT/LEFT HEART CATH AND CORONARY ANGIOGRAPHY N/A 07/07/2018   Procedure: RIGHT/LEFT HEART CATH AND CORONARY ANGIOGRAPHY;  Surgeon: Burnell Blanks, MD;  Location: [place] [facility];  Service: Cardiovascular;  Laterality: N/A;   TEE WITHOUT CARDIOVERSION N/A 03/29/2019   Procedure: TRANSESOPHAGEAL ECHOCARDIOGRAM (TEE);  Surgeon: Burnell Blanks, MD;  Location: [place];  Service: Open Heart Surgery;  Laterality: N/A;   TRANSCATHETER AORTIC VALVE REPLACEMENT, TRANSFEMORAL N/A 03/29/2019   Procedure: TRANSCATHETER AORTIC VALVE REPLACEMENT, TRANSFEMORAL;  Surgeon: Burnell Blanks, MD;  Location: [place];  Service: Open Heart Surgery;  Laterality: N/A;    ULTRASOUND GUIDANCE FOR VASCULAR ACCESS  07/07/2018   Procedure: Ultrasound Guidance For Vascular Access;  Surgeon: Burnell Blanks, MD;  Location: [place] [facility];  Service: Cardiovascular;;     Current Meds  Medication Sig   albuterol (PROVENTIL) (2.5 MG/3ML) 0.083% nebulizer solution Take 3 mLs (2.5 mg total) by nebulization every 4 (four) hours as needed for wheezing or shortness of breath. DX: COPD J44.9   ALPRAZolam (XANAX) 0.5 MG tablet TAKE ONE TABLET BY MOUTH AT BEDTIME AS NEEDED FOR ANXIETY   AMBULATORY NON FORMULARY MEDICATION Medication Name: Incentive spirometer Use 10-15 times per day   arformoterol (BROVANA) 15 MCG/2ML NEBU Take 2 mLs (15 mcg total) by nebulization 2 (two) times daily. DX: COPD J44.9   aspirin EC 81 MG tablet Take 1 tablet (81 mg total) by mouth daily.   benzonatate (TESSALON) 200 MG capsule Take 1 capsule (200 mg total) by mouth 3 (three) times daily as needed for cough.   budesonide (PULMICORT) 0.5 MG/2ML nebulizer solution Take 2 mLs (0.5 mg total) by nebulization 2 (two) times  daily. DX: COPD J44.9   escitalopram (LEXAPRO) 20 MG tablet Take 1 tablet (20 mg total) by mouth daily.   ferrous sulfate 325 (65 FE) MG tablet Take 1 tablet (325 mg total) by mouth daily.   fluticasone (FLONASE) 50 MCG/ACT nasal spray Place 2 sprays into both nostrils daily.   furosemide (LASIX) 20 MG tablet Take 1 tablet (20 mg total) by mouth daily.   guaiFENesin (MUCINEX) 600 MG 12 hr tablet Take 1 tablet (600 mg total) by mouth 2 (two) times daily.   loratadine (CLARITIN) 10 MG tablet Take 1 tablet (10 mg total) by mouth daily.   metoprolol tartrate (LOPRESSOR) 25 MG tablet Take 0.5 tablets (12.5 mg total) by mouth 2 (two) times daily.   pantoprazole (PROTONIX) 40 MG tablet Take 1 tablet (40 mg total) by mouth daily at 12 noon.   Respiratory Therapy Supplies (FLUTTER) DEVI 1 each by Does not apply route daily.   simvastatin (ZOCOR) 20 MG tablet Take 1  tablet (20 mg total) by mouth at bedtime.   traZODone (DESYREL) 50 MG tablet Take 0.5-1 tablets (25-50 mg total) by mouth at bedtime as needed for sleep.   triamcinolone cream (KENALOG) 0.1 % Apply 1 application topically 2 (two) times daily. (Patient taking differently: Apply 1 application topically 2 (two) times daily as needed (rash). )   VENTOLIN HFA 108 (90 Base) MCG/ACT inhaler INHALE 2 PUFFS EVERY 6HRS AS NEEDED FOR WHEEZING OR SHORTNESS OF BREATH. DO NOT USE IF USING NEBS. (Patient taking differently: Inhale 2 puffs into the lungs every 6 (six) hours as needed for wheezing. )     Allergies:   Contrast media [iodinated diagnostic agents] and Iohexol   Social History   Tobacco Use   Smoking status: Former Smoker    Packs/day: 2.00    Years: 30.00    Pack years: 60.00    Types: Cigarettes    Quit date: 10/28/1991    Years since quitting: 27.4   Smokeless tobacco: Never Used  Substance Use Topics   Alcohol use: Yes    Comment: 1 drink per night   Drug use: No     Family Hx: The patient's family history includes Other in her father and mother. There is no history of Colon cancer, Stomach cancer, Rectal cancer, or Pancreatic cancer.  ROS:   Please see the history of present illness.    All other systems reviewed and are negative.   Prior CV studies:   The following studies were reviewed today:  TAVR OPERATIVE NOTE   Date of Procedure:03/29/2019  Preoperative Diagnosis:Severe Aortic Stenosis   Postoperative Diagnosis:Same   Procedure:   Transcatheter Aortic Valve Replacement - PercutaneousRightTransfemoral Approach Edwards Sapien 3 THV (size 24mm, model # 9600TFX, serial # [id number])  Co-Surgeons:Bryan Alveria Apley, MD and Lauree Chandler, MD   Anesthesiologist:Kevin Conrad [place], MD  Echocardiographer:Mihai Croitoru, MD  Pre-operative Echo  Findings: ? Severe aortic stenosis ? Normalleft ventricular systolic function  Post-operative Echo Findings: ? Noparavalvular leak ? Normalleft ventricular systolic function   _______________   Echo 03/30/2019: IMPRESSIONS 1. The left ventricle has low normal systolic function, with an ejection fraction of 50-55%. The cavity size was normal. There is moderately increased left ventricular wall thickness. Left ventricular diastolic function could not be evaluated due to  nondiagnostic images. 2. There is akinesis of the basal inferior wall. 3. The right ventricle has normal systolic function. The cavity was normal. There is no increase in right ventricular wall thickness. Right ventricular systolic pressure is  moderately elevated with an estimated pressure of 39.6 mmHg. 4. The mitral valve is degenerative. Mild thickening of the anterior mitral valve leaflet. Moderate calcification of the anterior mitral valve leaflet. There is moderate mitral annular calcification present. MV Mean grad: 4.0 mmHg, 5. - TAVR: S/P 33mm Edwards Sapien TAVR which is functioning normally with no perivalvular AI. The mean AVG is 9.7mmHg, Dimensionless index 0.53, AVA (VTI) 1.67cm2 and AV max 208 cm/s. 6. The interatrial septum appears to be lipomatous. 7. The inferior vena cava was dilated in size with <50% respiratory variability. 8. Moderate pleural effusion in the left lateral region.  Labs/Other Tests and Data Reviewed:    EKG:  No ECG reviewed.  Recent Labs: 03/25/2019: ALT 15; B Natriuretic Peptide 1,175.3 03/30/2019: Magnesium 1.8 04/01/2019: BUN 21; Creatinine, Ser 0.83; Hemoglobin 8.0; Platelets 199; Potassium 3.6; Sodium 140   Recent Lipid Panel Lab Results  Component Value Date/Time   CHOL 125 05/27/2017 10:52 AM   TRIG 52.0 05/27/2017 10:52 AM   HDL 54.70 05/27/2017 10:52 AM   CHOLHDL 2 05/27/2017 10:52 AM   LDLCALC 60 05/27/2017 10:52 AM   LDLDIRECT 150.0 06/12/2008 09:31 AM     Wt Readings from Last 3 Encounters:  04/07/19 156 lb (70.8 kg)  04/01/19 152 lb 1.9 oz (69 kg)  03/25/19 134 lb (60.8 kg)     Objective:    Vital Signs:  Ht 5\' 4"  (1.626 m)    Wt 156 lb (70.8 kg)    BMI 26.78 kg/m    Well nourished, well developed female in no acute distress.   ASSESSMENT & PLAN:    Severe AS s/p TAVR: doing well. Groin sites healing well. She feels like a new person. No s/s HAVB. I will see her back next month for echo and follow up. Continue aspirin only.   Chronic diastolic CHF: no s/s CHF. Plan to continue lasix 20mg  daily. BMET when she comes in for her echo.  COPD: continue continuous 02 and home meds.   Hx of DVT/PE:she had been treated with Eliquis > 6 month.Given worsening anemia this was discontinued and she was started on a baby aspirin  Chronic anemia: started on iron. Check CBC when she comes in for echo  COVID-19 Education: The signs and symptoms of COVID-19 were discussed with the patient and how to seek care for testing (follow up with PCP or arrange E-visit).  The importance of social distancing was discussed today.  Time:   Today, I have spent 25 minutes with the patient with telehealth technology discussing the above problems.     Medication Adjustments/Labs and Tests Ordered: Current medicines are reviewed at length with the patient today.  Concerns regarding medicines are outlined above.   Tests Ordered: No orders of the defined types were placed in this encounter.   Medication Changes: No orders of the defined types were placed in this encounter.   Disposition:  Follow up in 1 month(s)   Signed, Jessica Form, PA-C  04/07/2019 2:27 PM    [place]

## 2019-04-15 ENCOUNTER — Encounter: Payer: Self-pay | Admitting: Thoracic Surgery (Cardiothoracic Vascular Surgery)

## 2019-04-26 ENCOUNTER — Other Ambulatory Visit: Payer: Self-pay

## 2019-04-26 ENCOUNTER — Other Ambulatory Visit (HOSPITAL_COMMUNITY): Payer: Self-pay

## 2019-04-27 DIAGNOSIS — M1612 Unilateral primary osteoarthritis, left hip: Secondary | ICD-10-CM | POA: Diagnosis not present

## 2019-04-27 DIAGNOSIS — M1712 Unilateral primary osteoarthritis, left knee: Secondary | ICD-10-CM | POA: Diagnosis not present

## 2019-05-04 ENCOUNTER — Other Ambulatory Visit: Payer: Medicare Other

## 2019-05-04 ENCOUNTER — Other Ambulatory Visit (HOSPITAL_COMMUNITY): Payer: Medicare Other

## 2019-05-05 ENCOUNTER — Telehealth: Payer: Medicare Other | Admitting: Physician Assistant

## 2019-05-08 ENCOUNTER — Other Ambulatory Visit: Payer: Self-pay | Admitting: Family Medicine

## 2019-05-08 DIAGNOSIS — G4700 Insomnia, unspecified: Secondary | ICD-10-CM

## 2019-05-08 DIAGNOSIS — F411 Generalized anxiety disorder: Secondary | ICD-10-CM

## 2019-05-10 ENCOUNTER — Telehealth: Payer: Self-pay | Admitting: Physician Assistant

## 2019-05-10 NOTE — Progress Notes (Signed)
[facility] [place]                                       Cardiology Office Note    Date:  05/11/2019   ID:  Jessica [name], DOB 03/24/1926, MRN [id number]  PCP:  [name], [name], MD  Cardiologist: [name], MD / Dr. [name] & Dr. Cyndia Bent (TAVR)  CC: 1 month s/p TAVR (IN OFFICE VISIT)  History of Present Illness:  Jessica [name] is a 85 y.o. female with a history of COPD on home 02, CAD s/p DES to LAD (06/2018), HLD, DVT/PE on Eliquis, severe contrast dye allergy, and severe AS s/p TAVR (03/29/19) who presents for follow up.   The patient does not have symptoms concerning for COVID-19 infection (fever, chills, cough, or new shortness of breath).   The patient has knownsevere aortic stenosis who was worked up for consideration of TAVR in September 2019 after presenting with shortness of breath. Her cardiac catheterization on 07/07/2018 showed a heavily calcified 80% proximal LAD stenosis and she underwent atherectomy and stenting on 07/23/2018. She was premedicated with steroids and Benadryl due to previous known allergy to intravenous contrast. Despite this she developed a delayed contrast reaction resulting in pruritus, generalized edema, and hypotension. Due to her severe reaction she was premedicated before and after her CT scans and kept as an inpatient. She did well with this with no significant reaction. CT scan did show an incidental finding of an acute right lung pulmonary embolism and lower extremity Doppler showed right leg DVT. She was started on Eliquis and continued on Plavix for 6 months before considering TAVR. She was admitted in February 2020 with pneumonia and likely COPD exacerbation with acute congestive heart failure. She was seen back in the emergency room in March with orthostasis and urinary tract infection. She was seen in a telehealth visit on 02/10/2019 by our PA Nell Range and still had some shortness of  breath with exertion but was fairly stable. Her Plavix was discontinued since it has been 6 months since her PCI with plans for follow-up. She had a follow-up echocardiogram on 03/17/2019 which showed an increase in the mean gradient across aortic valve to 53 mmHg with a peak gradient of 73 mmHg. Aortic valve area was 0.58 cm. Left ventricular ejection fraction was 60 to 65%.  She underwent successful TAVR with a 26 mm Edwards Sapien 3 THV via the TF approach on 03/29/19. Post operative echoshowed EF 50%, akinesis of basal inf wall, normally funcitoning TAVR with mean gradient 9.5 mm Hg and no PVL. She had worsening anemia down to 7.5. Eliqius was discontinued and she was started on a baby aspirin. She also had mild volume overload and treated with IV lasix. She was discharged on lasix 20mg  daily.   Today she presents for follow up. Jessica [name], her daughter, is here for the visit. She is doing much better since her TAVR. She is able to do more without getting as tired out. She still has chronic dyspnea related to her COPD. Has chronic 3 pillow orthopnea that is stable. LE edema has resolved. No chest pain. No blood in her stool or urine. She recently went to the beach with her daughter and grandson.   Past Medical History:  Diagnosis Date  . Anxiety   . congenital nystagmus   . COPD (chronic obstructive  pulmonary disease) ([place])   . Coronary artery disease   . DJD (degenerative joint disease)   . Fibromyalgia   . Low back pain syndrome   . Memory loss   . Other and unspecified hyperlipidemia   . Pancreatic lesion   . Pneumonia   . S/P TAVR (transcatheter aortic valve replacement)    26 mm Edwards Sapien 3 via the TF approach  . Severe aortic stenosis   . Venous insufficiency     Past Surgical History:  Procedure Laterality Date  . ABDOMINAL HYSTERECTOMY    . ANTERIOR CERVICAL DISCECTOMY    . APPENDECTOMY    . CARPAL TUNNEL RELEASE  12/2011   right arm  . CATARACT EXTRACTION    .  CORONARY ATHERECTOMY  07/23/2018   PTCA/orbital atherectomy/DES x 1 proximal to mid LAD  . CORONARY ATHERECTOMY N/A 07/23/2018   Procedure: CORONARY ATHERECTOMY;  Surgeon: Burnell Blanks, MD;  Location: [facility];  Service: Cardiovascular;  Laterality: N/A;  . CORONARY STENT INTERVENTION    . CORONARY STENT INTERVENTION N/A 07/23/2018   Procedure: CORONARY STENT INTERVENTION;  Surgeon: Burnell Blanks, MD;  Location: [facility];  Service: Cardiovascular;  Laterality: N/A;  . EYE SURGERY    . LUMBAR LAMINECTOMY    . right knee arthroscopy    . right shoulder replacement  04/2009  . right shoulder surgery  2008   Dr. Noemi Chapel  . RIGHT/LEFT HEART CATH AND CORONARY ANGIOGRAPHY N/A 07/07/2018   Procedure: RIGHT/LEFT HEART CATH AND CORONARY ANGIOGRAPHY;  Surgeon: Burnell Blanks, MD;  Location: [facility];  Service: Cardiovascular;  Laterality: N/A;  . TEE WITHOUT CARDIOVERSION N/A 03/29/2019   Procedure: TRANSESOPHAGEAL ECHOCARDIOGRAM (TEE);  Surgeon: Burnell Blanks, MD;  Location: [facility];  Service: Open Heart Surgery;  Laterality: N/A;  . TRANSCATHETER AORTIC VALVE REPLACEMENT, TRANSFEMORAL N/A 03/29/2019   Procedure: TRANSCATHETER AORTIC VALVE REPLACEMENT, TRANSFEMORAL;  Surgeon: Burnell Blanks, MD;  Location: [facility];  Service: Open Heart Surgery;  Laterality: N/A;  . ULTRASOUND GUIDANCE FOR VASCULAR ACCESS  07/07/2018   Procedure: Ultrasound Guidance For Vascular Access;  Surgeon: Burnell Blanks, MD;  Location: [facility];  Service: Cardiovascular;;    Current Medications: Outpatient Medications Prior to Visit  Medication Sig Dispense Refill  . albuterol (PROVENTIL) (2.5 MG/3ML) 0.083% nebulizer solution Take 3 mLs (2.5 mg total) by nebulization every 4 (four) hours as needed for wheezing or shortness of breath. DX: COPD J44.9 75 mL 12  . ALPRAZolam (XANAX) 0.5 MG tablet TAKE ONE TABLET BY MOUTH AT BEDTIME AS NEEDED FOR  ANXIETY 30 tablet 2  . AMBULATORY NON FORMULARY MEDICATION Medication Name: Incentive spirometer Use 10-15 times per day 1 each 0  . arformoterol (BROVANA) 15 MCG/2ML NEBU Take 2 mLs (15 mcg total) by nebulization 2 (two) times daily. DX: COPD J44.9 120 mL 12  . aspirin EC 81 MG tablet Take 1 tablet (81 mg total) by mouth daily.    . benzonatate (TESSALON) 200 MG capsule Take 1 capsule (200 mg total) by mouth 3 (three) times daily as needed for cough. 20 capsule 0  . budesonide (PULMICORT) 0.5 MG/2ML nebulizer solution Take 2 mLs (0.5 mg total) by nebulization 2 (two) times daily. DX: COPD J44.9 120 mL 12  . escitalopram (LEXAPRO) 20 MG tablet Take 1 tablet (20 mg total) by mouth daily. 90 tablet 1  . ferrous sulfate 325 (65 FE) MG tablet Take 1 tablet (325 mg total)  by mouth daily. 90 tablet 3  . fluticasone (FLONASE) 50 MCG/ACT nasal spray Place 2 sprays into both nostrils daily. 16 [name] 0  . furosemide (LASIX) 20 MG tablet Take 1 tablet (20 mg total) by mouth daily. 90 tablet 3  . guaiFENesin (MUCINEX) 600 MG 12 hr tablet Take 1 tablet (600 mg total) by mouth 2 (two) times daily. 14 tablet 0  . loratadine (CLARITIN) 10 MG tablet Take 1 tablet (10 mg total) by mouth daily. 10 tablet 0  . metoprolol tartrate (LOPRESSOR) 25 MG tablet Take 0.5 tablets (12.5 mg total) by mouth 2 (two) times daily. 60 tablet 0  . pantoprazole (PROTONIX) 40 MG tablet Take 1 tablet (40 mg total) by mouth daily at 12 noon. 30 tablet 0  . Respiratory Therapy Supplies (FLUTTER) DEVI 1 each by Does not apply route daily. 1 each 0  . simvastatin (ZOCOR) 20 MG tablet Take 1 tablet (20 mg total) by mouth at bedtime. 90 tablet 3  . traZODone (DESYREL) 50 MG tablet Take 0.5-1 tablets (25-50 mg total) by mouth at bedtime as needed for sleep. 90 tablet 0  . triamcinolone cream (KENALOG) 0.1 % Apply 1 application topically 2 (two) times daily. (Patient taking differently: Apply 1 application topically 2 (two) times daily as needed  (rash). ) 45 [name] 0  . VENTOLIN HFA 108 (90 Base) MCG/ACT inhaler INHALE 2 PUFFS EVERY 6HRS AS NEEDED FOR WHEEZING OR SHORTNESS OF BREATH. DO NOT USE IF USING NEBS. (Patient taking differently: Inhale 2 puffs into the lungs every 6 (six) hours as needed for wheezing. ) 18 Inhaler 2   No facility-administered medications prior to visit.      Allergies:   Contrast media [iodinated diagnostic agents] and Iohexol   Social History   Socioeconomic History  . Marital status: Widowed    Spouse name: Not on file  . Number of children: 2  . Years of education: Not on file  . Highest education level: Not on file  Occupational History  . Occupation: [employer]  Social Needs  . Financial resource strain: Not on file  . Food insecurity    Worry: Not on file    Inability: Not on file  . Transportation needs    Medical: Not on file    Non-medical: Not on file  Tobacco Use  . Smoking status: Former Smoker    Packs/day: 2.00    Years: 30.00    Pack years: 60.00    Types: Cigarettes    Quit date: 10/28/1991    Years since quitting: 27.5  . Smokeless tobacco: Never Used  Substance and Sexual Activity  . Alcohol use: Yes    Comment: 1 drink per night  . Drug use: No  . Sexual activity: Not Currently  Lifestyle  . Physical activity    Days per week: Not on file    Minutes per session: Not on file  . Stress: Not on file  Relationships  . Social Herbalist on phone: Not on file    Gets together: Not on file    Attends religious service: Not on file    Active member of club or organization: Not on file    Attends meetings of clubs or organizations: Not on file    Relationship status: Not on file  Other Topics Concern  . Not on file  Social History Narrative   1 sibling alive age 73   1 sibling alive age 45   1  sibling deceased age 10   1 sibling alive age 5     Family History:  The patient's family history includes Other in her father and mother.        ROS:   Please see the history of present illness.    ROS All other systems reviewed and are negative.   PHYSICAL EXAM:   VS:  BP 120/62   Pulse 77   Ht 5\' 4"  (1.626 m)   Wt 154 lb (69.9 kg)   SpO2 91%   BMI 26.43 kg/m    GEN:  Elderly and frail appearing HEENT: normal Neck: no JVD or masses Cardiac: RRR; no murmurs, rubs, or gallops,no edema  Respiratory:  clear to auscultation bilaterally, normal work of breathing GI: soft, nontender, nondistended, + BS MS: no deformity or atrophy Skin: warm and dry, no rash Neuro:  Alert and Oriented x 3, Strength and sensation are intact Psych: euthymic mood, full affect   Wt Readings from Last 3 Encounters:  05/11/19 154 lb (69.9 kg)  04/07/19 156 lb (70.8 kg)  04/01/19 152 lb 1.9 oz (69 kg)      Studies/Labs Reviewed:   EKG:  EKG is ordered today.  The ekg ordered today demonstrates sinus with PAC and PVCs. IRBBB  HR 77  Recent Labs: 03/25/2019: ALT 15; B Natriuretic Peptide 1,175.3 03/30/2019: Magnesium 1.8 04/01/2019: BUN 21; Creatinine, Ser 0.83; Hemoglobin 8.0; Platelets 199; Potassium 3.6; Sodium 140   Lipid Panel    Component Value Date/Time   CHOL 125 05/27/2017 1052   TRIG 52.0 05/27/2017 1052   HDL 54.70 05/27/2017 1052   CHOLHDL 2 05/27/2017 1052   VLDL 10.4 05/27/2017 1052   LDLCALC 60 05/27/2017 1052   LDLDIRECT 150.0 06/12/2008 0931    Additional studies/ records that were reviewed today include:  TAVR OPERATIVE NOTE   Date of Procedure:03/29/2019  Preoperative Diagnosis:Severe Aortic Stenosis   Postoperative Diagnosis:Same   Procedure:   Transcatheter Aortic Valve Replacement - PercutaneousRightTransfemoral Approach Edwards Sapien 3 THV (size 69mm, model # 9600TFX, serial # [id number])  Co-Surgeons:Bryan Alveria Apley, MD and Lauree Chandler, MD   Anesthesiologist:Kevin Conrad Esmond, MD   Echocardiographer:Mihai Croitoru, MD  Pre-operative Echo Findings: ? Severe aortic stenosis ? Normalleft ventricular systolic function  Post-operative Echo Findings: ? Noparavalvular leak ? Normalleft ventricular systolic function   _______________   Echo 03/30/2019: IMPRESSIONS 1. The left ventricle has low normal systolic function, with an ejection fraction of 50-55%. The cavity size was normal. There is moderately increased left ventricular wall thickness. Left ventricular diastolic function could not be evaluated due to  nondiagnostic images. 2. There is akinesis of the basal inferior wall. 3. The right ventricle has normal systolic function. The cavity was normal. There is no increase in right ventricular wall thickness. Right ventricular systolic pressure is moderately elevated with an estimated pressure of 39.6 mmHg. 4. The mitral valve is degenerative. Mild thickening of the anterior mitral valve leaflet. Moderate calcification of the anterior mitral valve leaflet. There is moderate mitral annular calcification present. MV Mean grad: 4.0 mmHg, 5. - TAVR: S/P 31mm Edwards Sapien TAVR which is functioning normally with no perivalvular AI. The mean AVG is 9.27mmHg, Dimensionless index 0.53, AVA (VTI) 1.67cm2 and AV max 208 cm/s. 6. The interatrial septum appears to be lipomatous. 7. The inferior vena cava was dilated in size with <50% respiratory variability. 8. Moderate pleural effusion in the left lateral region.   _______________  Echo 05/11/19:  IMPRESSIONS  1. The left ventricle has  normal systolic function with an ejection fraction of 60-65%. The cavity size was normal. Left ventricular diastolic Doppler parameters are consistent with impaired relaxation.  2. The right ventricle has normal systolic function. The cavity was normal. There is no increase in right ventricular wall thickness.  3. Moderate thickening of the mitral valve leaflet.  Moderate calcification of the mitral valve leaflet.  4. A 26 Edwards Sapien bioprosthetic aortic valve (TAVR) valve is present in the aortic position. Procedure Date: 03/29/2019 Normal aortic valve prosthesis.  5. Mild thickening of the aortic valve. Mild calcification of the aortic valve. Aortic valve regurgitation was not assessed by color flow Doppler.  6. TAVR: 2.32m/s peak velocity, mean gradient 75mmHg. AVA 2.01cm squared.   ASSESSMENT & PLAN:   Severe AS s/p TAVR: echo today shows EF 0%, normally functioning TAVR with mean gradient 9 mm Hg and no PVL. She has NYHA class II symptoms, mostly related to her COPD. She has had a big improvement in her heart failure and breathing since TAVR. SBE prophylaxis discussed; she is edentulous. She is on monotherapy with aspirin given history of anemia and high risk for bleed. She will continue on this indefinitely.   Chronic diastolic CHF: appear euvolemic. Continue lasix 20mg  daily. BMET today  COPD: continue continuous 02 and home meds.   Hx of DVT/PE:she had been treated with Eliquis > 6 month.Given worsening anemia this was discontinued and she was started on a baby aspirin  Chronic anemia: Hg 8 on 04/01/19. Placed on iron. Check CBC today   Medication Adjustments/Labs and Tests Ordered: Current medicines are reviewed at length with the patient today.  Concerns regarding medicines are outlined above.  Medication changes, Labs and Tests ordered today are listed in the Patient Instructions below. Patient Instructions  Medication Instructions:  Your physician recommends that you continue on your current medications as directed. Please refer to the Current Medication list given to you today.  If you need a refill on your cardiac medications before your next appointment, please call your pharmacy.   Lab work: TODAY: CBC, BMET  If you have labs (blood work) drawn today and your tests are completely normal, you will receive your results only  by: Marland Kitchen MyChart Message (if you have MyChart) OR . A paper copy in the mail If you have any lab test that is abnormal or we need to change your treatment, we will call you to review the results.  Testing/Procedures: Your physician has requested that you have an echocardiogram on 04/11/20 at 12:45 PM. Echocardiography is a painless test that uses sound waves to create images of your heart. It provides your doctor with information about the size and shape of your heart and how well your heart's chambers and valves are working. This procedure takes approximately one hour. There are no restrictions for this procedure.  Follow-Up:  . Follow up with Dr. [name] in 3-4 months. You will be contacted to arrange. . Follow up with Nell Range, PA on 04/11/20 at 2:00 PM  Any Other Special Instructions Will Be Listed Below (If Applicable).       Signed, [name], PA-C  05/11/2019 2:32 PM    [facility] [address], [place], [place]  [address] Phone: [phone]; Fax: [phone]

## 2019-05-10 NOTE — Telephone Encounter (Signed)
New Message    Left message to confirm appt

## 2019-05-11 ENCOUNTER — Ambulatory Visit (HOSPITAL_COMMUNITY): Payer: Medicare Other | Attending: Cardiology

## 2019-05-11 ENCOUNTER — Encounter: Payer: Self-pay | Admitting: Physician Assistant

## 2019-05-11 ENCOUNTER — Other Ambulatory Visit: Payer: Self-pay

## 2019-05-11 ENCOUNTER — Telehealth (INDEPENDENT_AMBULATORY_CARE_PROVIDER_SITE_OTHER): Payer: Medicare Other | Admitting: Physician Assistant

## 2019-05-11 ENCOUNTER — Other Ambulatory Visit: Payer: Medicare Other | Admitting: *Deleted

## 2019-05-11 VITALS — BP 120/62 | HR 77 | Ht 64.0 in | Wt 154.0 lb

## 2019-05-11 DIAGNOSIS — J449 Chronic obstructive pulmonary disease, unspecified: Secondary | ICD-10-CM | POA: Diagnosis not present

## 2019-05-11 DIAGNOSIS — Z952 Presence of prosthetic heart valve: Secondary | ICD-10-CM | POA: Diagnosis not present

## 2019-05-11 DIAGNOSIS — I5032 Chronic diastolic (congestive) heart failure: Secondary | ICD-10-CM | POA: Diagnosis not present

## 2019-05-11 DIAGNOSIS — D649 Anemia, unspecified: Secondary | ICD-10-CM

## 2019-05-11 DIAGNOSIS — Z86711 Personal history of pulmonary embolism: Secondary | ICD-10-CM

## 2019-05-11 NOTE — Patient Instructions (Addendum)
Medication Instructions:  Your physician recommends that you continue on your current medications as directed. Please refer to the Current Medication list given to you today.  If you need a refill on your cardiac medications before your next appointment, please call your pharmacy.   Lab work: TODAY: CBC, BMET  If you have labs (blood work) drawn today and your tests are completely normal, you will receive your results only by: Marland Kitchen MyChart Message (if you have MyChart) OR . A paper copy in the mail If you have any lab test that is abnormal or we need to change your treatment, we will call you to review the results.  Testing/Procedures: Your physician has requested that you have an echocardiogram on 04/11/20 at 12:45 PM. Echocardiography is a painless test that uses sound waves to create images of your heart. It provides your doctor with information about the size and shape of your heart and how well your heart's chambers and valves are working. This procedure takes approximately one hour. There are no restrictions for this procedure.  Follow-Up:  . Follow up with Dr. [name] in 3-4 months. You will be contacted to arrange. . Follow up with Nell Range, PA on 04/11/20 at 2:00 PM  Any Other Special Instructions Will Be Listed Below (If Applicable).

## 2019-05-12 ENCOUNTER — Telehealth: Payer: Medicare Other | Admitting: Physician Assistant

## 2019-05-12 LAB — BASIC METABOLIC PANEL
BUN/Creatinine Ratio: 10 — ABNORMAL LOW (ref 12–28)
BUN: 8 mg/dL — ABNORMAL LOW (ref 10–36)
CO2: 27 mmol/L (ref 20–29)
Calcium: 9.2 mg/dL (ref 8.7–10.3)
Chloride: 99 mmol/L (ref 96–106)
Creatinine, Ser: 0.77 mg/dL (ref 0.57–1.00)
GFR calc Af Amer: 78 mL/min/{1.73_m2} (ref >59)
GFR calc non Af Amer: 68 mL/min/{1.73_m2} (ref >59)
Glucose: 88 mg/dL (ref 65–99)
Potassium: 3.9 mmol/L (ref 3.5–5.2)
Sodium: 143 mmol/L (ref 134–144)

## 2019-05-12 LAB — CBC
Hematocrit: 36.1 % (ref 34.0–46.6)
Hemoglobin: 11.3 g/dL (ref 11.1–15.9)
MCH: 27.6 pg (ref 26.6–33.0)
MCHC: 31.3 g/dL — ABNORMAL LOW (ref 31.5–35.7)
MCV: 88 fL (ref 79–97)
Platelets: 258 10*3/uL (ref 150–450)
RBC: 4.1 x10E6/uL (ref 3.77–5.28)
RDW: 17 % — ABNORMAL HIGH (ref 11.7–15.4)
WBC: 6.4 10*3/uL (ref 3.4–10.8)

## 2019-05-17 ENCOUNTER — Encounter: Payer: Self-pay | Admitting: Thoracic Surgery (Cardiothoracic Vascular Surgery)

## 2019-06-10 ENCOUNTER — Other Ambulatory Visit: Payer: Self-pay | Admitting: Family Medicine

## 2019-06-10 DIAGNOSIS — G4700 Insomnia, unspecified: Secondary | ICD-10-CM

## 2019-06-10 DIAGNOSIS — F411 Generalized anxiety disorder: Secondary | ICD-10-CM

## 2019-06-27 ENCOUNTER — Other Ambulatory Visit: Payer: Self-pay

## 2019-06-27 ENCOUNTER — Ambulatory Visit (INDEPENDENT_AMBULATORY_CARE_PROVIDER_SITE_OTHER): Payer: Medicare Other | Admitting: Family Medicine

## 2019-06-27 ENCOUNTER — Encounter: Payer: Self-pay | Admitting: Family Medicine

## 2019-06-27 VITALS — Ht 65.0 in | Wt 165.0 lb

## 2019-06-27 DIAGNOSIS — G4700 Insomnia, unspecified: Secondary | ICD-10-CM | POA: Diagnosis not present

## 2019-06-27 DIAGNOSIS — R0602 Shortness of breath: Secondary | ICD-10-CM | POA: Diagnosis not present

## 2019-06-27 DIAGNOSIS — J9610 Chronic respiratory failure, unspecified whether with hypoxia or hypercapnia: Secondary | ICD-10-CM | POA: Diagnosis not present

## 2019-06-27 DIAGNOSIS — I2510 Atherosclerotic heart disease of native coronary artery without angina pectoris: Secondary | ICD-10-CM | POA: Diagnosis not present

## 2019-06-27 DIAGNOSIS — F411 Generalized anxiety disorder: Secondary | ICD-10-CM

## 2019-06-27 DIAGNOSIS — J449 Chronic obstructive pulmonary disease, unspecified: Secondary | ICD-10-CM

## 2019-06-27 MED ORDER — ALPRAZOLAM 0.5 MG PO TABS: 0.5000 mg | ORAL_TABLET | Freq: Two times a day (BID) | ORAL | 2 refills | Status: DC | PRN

## 2019-06-27 NOTE — Progress Notes (Signed)
Virtual Visit via Video Note   I connected with Jessica [name] and her daughter on 06/27/19 by a video enabled telemedicine application and verified that I am speaking with the correct person using two identifiers.  Location patient: home Location provider:work office Persons participating in the virtual visit: patient, provider  I discussed the limitations of evaluation and management by telemedicine and the availability of in person appointments. The patient expressed understanding and agreed to proceed.   HPI: Jessica [name] is a 85 yo female with Hx of COPD,aortic stenosis,anxiety,insomnia,and fibromyalgia among some following today on chronic health problems.  She was last seen on 12/21/2018, when she was complaining about constipation.Problem improved,last bowel movement today. she is having bowel movements daily. Negative for abdominal pain, nausea, vomiting, or melena. She is eating well,last wt 165 Lb.  Since her last visit she has had aortic valve procedure, TAVR, due to severe AS that was worsening dyspnea. Recovered well. Developed delayed contrast reaction after cardiac cath, hypotension,generalized edema,and skin pruritus.  Chest CT done before procedure showed incidental  Right lung PE, LE doppler positive for RLE DVT. Placed on Eliquis, treatment completed.  Dyspnea has improved some but still having exertional dyspnea with no CP or diaphoresis. Problem attributed to COPD. She follows with pulmonologist.  She is on continuous O2 supplementation, 2 L/min. She is using a walker at home, most of the time she is seated.  Her daughter would like to have a wheelchair, so she can be taken to appointments.She has not able to keep appt because it is hard for her because she can not walk for long.   Aspirin was discontinued because of anemia during hospitalization. H/H was 7.5/24.6 03/31/19. Daughter is planning on asking cardiologist if she can resume Aspirin.   Lab Results   Component Value Date   WBC 6.4 05/11/2019   HGB 11.3 05/11/2019   HCT 36.1 05/11/2019   MCV 88 05/11/2019   PLT 258 05/11/2019   She is currently on trazodone, her daughter does not think that this medication helps, she has been taking it intermittently. She wakes up through the night to use the bathroom.Her daughter sleeps in the same room ,so she helps her to prevent falls.  Lexapro 20 mg daily and alprazolam 0.5 mg daily. According to her daughter, frequently she has some anxiety during the day and requests another dose of alprazolam.  Alprazolam still helps with sleep and acute anxiety,no side effects reported. No suicidal thoughts.  HTN,she is on Metoprolol 12.5 mg bid. She also takes Furosemide 20 mg daily as needed.   ROS: See pertinent positives and negatives per HPI.  Past Medical History:  Diagnosis Date  . Anxiety   . congenital nystagmus   . COPD (chronic obstructive pulmonary disease) ([place])   . Coronary artery disease   . DJD (degenerative joint disease)   . Fibromyalgia   . Low back pain syndrome   . Memory loss   . Other and unspecified hyperlipidemia   . Pancreatic lesion   . Pneumonia   . S/P TAVR (transcatheter aortic valve replacement)    26 mm Edwards Sapien 3 via the TF approach  . Severe aortic stenosis   . Venous insufficiency     Past Surgical History:  Procedure Laterality Date  . ABDOMINAL HYSTERECTOMY    . ANTERIOR CERVICAL DISCECTOMY    . APPENDECTOMY    . CARPAL TUNNEL RELEASE  12/2011   right arm  . CATARACT EXTRACTION    . CORONARY ATHERECTOMY  07/23/2018   PTCA/orbital atherectomy/DES x 1 proximal to mid LAD  . CORONARY ATHERECTOMY N/A 07/23/2018   Procedure: CORONARY ATHERECTOMY;  Surgeon: Burnell Blanks, MD;  Location: [facility];  Service: Cardiovascular;  Laterality: N/A;  . CORONARY STENT INTERVENTION    . CORONARY STENT INTERVENTION N/A 07/23/2018   Procedure: CORONARY STENT INTERVENTION;  Surgeon: Burnell Blanks, MD;  Location: [facility];  Service: Cardiovascular;  Laterality: N/A;  . EYE SURGERY    . LUMBAR LAMINECTOMY    . right knee arthroscopy    . right shoulder replacement  04/2009  . right shoulder surgery  2008   Dr. Noemi Chapel  . RIGHT/LEFT HEART CATH AND CORONARY ANGIOGRAPHY N/A 07/07/2018   Procedure: RIGHT/LEFT HEART CATH AND CORONARY ANGIOGRAPHY;  Surgeon: Burnell Blanks, MD;  Location: [facility];  Service: Cardiovascular;  Laterality: N/A;  . TEE WITHOUT CARDIOVERSION N/A 03/29/2019   Procedure: TRANSESOPHAGEAL ECHOCARDIOGRAM (TEE);  Surgeon: Burnell Blanks, MD;  Location: [facility];  Service: Open Heart Surgery;  Laterality: N/A;  . TRANSCATHETER AORTIC VALVE REPLACEMENT, TRANSFEMORAL N/A 03/29/2019   Procedure: TRANSCATHETER AORTIC VALVE REPLACEMENT, TRANSFEMORAL;  Surgeon: Burnell Blanks, MD;  Location: [facility];  Service: Open Heart Surgery;  Laterality: N/A;  . ULTRASOUND GUIDANCE FOR VASCULAR ACCESS  07/07/2018   Procedure: Ultrasound Guidance For Vascular Access;  Surgeon: Burnell Blanks, MD;  Location: [facility];  Service: Cardiovascular;;    Family History  Problem Relation Age of Onset  . Other Mother        broken hip  . Other Father        kidney problems  . Colon cancer Neg Hx   . Stomach cancer Neg Hx   . Rectal cancer Neg Hx   . Pancreatic cancer Neg Hx     Social History   Socioeconomic History  . Marital status: Widowed    Spouse name: Not on file  . Number of children: 2  . Years of education: Not on file  . Highest education level: Not on file  Occupational History  . Occupation: [employer]  Social Needs  . Financial resource strain: Not on file  . Food insecurity    Worry: Not on file    Inability: Not on file  . Transportation needs    Medical: Not on file    Non-medical: Not on file  Tobacco Use  . Smoking status: Former Smoker    Packs/day: 2.00    Years: 30.00     Pack years: 60.00    Types: Cigarettes    Quit date: 10/28/1991    Years since quitting: 27.6  . Smokeless tobacco: Never Used  Substance and Sexual Activity  . Alcohol use: Yes    Comment: 1 drink per night  . Drug use: No  . Sexual activity: Not Currently  Lifestyle  . Physical activity    Days per week: Not on file    Minutes per session: Not on file  . Stress: Not on file  Relationships  . Social Herbalist on phone: Not on file    Gets together: Not on file    Attends religious service: Not on file    Active member of club or organization: Not on file    Attends meetings of clubs or organizations: Not on file    Relationship status: Not on file  . Intimate partner violence    Fear of current  or ex partner: Not on file    Emotionally abused: Not on file    Physically abused: Not on file    Forced sexual activity: Not on file  Other Topics Concern  . Not on file  Social History Narrative   1 sibling alive age 24   1 sibling alive age 7   1 sibling deceased age 89   1 sibling alive age 86    Current Outpatient Medications:  .  albuterol (PROVENTIL) (2.5 MG/3ML) 0.083% nebulizer solution, Take 3 mLs (2.5 mg total) by nebulization every 4 (four) hours as needed for wheezing or shortness of breath. DX: COPD J44.9, Disp: 75 mL, Rfl: 12 .  ALPRAZolam (XANAX) 0.5 MG tablet, Take 1 tablet (0.5 mg total) by mouth 2 (two) times daily as needed for anxiety., Disp: 60 tablet, Rfl: 2 .  AMBULATORY NON FORMULARY MEDICATION, Medication Name: Incentive spirometer Use 10-15 times per day, Disp: 1 each, Rfl: 0 .  arformoterol (BROVANA) 15 MCG/2ML NEBU, Take 2 mLs (15 mcg total) by nebulization 2 (two) times daily. DX: COPD J44.9, Disp: 120 mL, Rfl: 12 .  aspirin EC 81 MG tablet, Take 1 tablet (81 mg total) by mouth daily., Disp: , Rfl:  .  benzonatate (TESSALON) 200 MG capsule, Take 1 capsule (200 mg total) by mouth 3 (three) times daily as needed for cough., Disp: 20  capsule, Rfl: 0 .  budesonide (PULMICORT) 0.5 MG/2ML nebulizer solution, Take 2 mLs (0.5 mg total) by nebulization 2 (two) times daily. DX: COPD J44.9, Disp: 120 mL, Rfl: 12 .  escitalopram (LEXAPRO) 20 MG tablet, Take 1 tablet (20 mg total) by mouth daily., Disp: 90 tablet, Rfl: 1 .  ferrous sulfate 325 (65 FE) MG tablet, Take 1 tablet (325 mg total) by mouth daily., Disp: 90 tablet, Rfl: 3 .  fluticasone (FLONASE) 50 MCG/ACT nasal spray, Place 2 sprays into both nostrils daily., Disp: 16 g, Rfl: 0 .  furosemide (LASIX) 20 MG tablet, Take 1 tablet (20 mg total) by mouth daily., Disp: 90 tablet, Rfl: 3 .  guaiFENesin (MUCINEX) 600 MG 12 hr tablet, Take 1 tablet (600 mg total) by mouth 2 (two) times daily., Disp: 14 tablet, Rfl: 0 .  loratadine (CLARITIN) 10 MG tablet, Take 1 tablet (10 mg total) by mouth daily., Disp: 10 tablet, Rfl: 0 .  metoprolol tartrate (LOPRESSOR) 25 MG tablet, Take 0.5 tablets (12.5 mg total) by mouth 2 (two) times daily., Disp: 60 tablet, Rfl: 0 .  pantoprazole (PROTONIX) 40 MG tablet, Take 1 tablet (40 mg total) by mouth daily at 12 noon., Disp: 30 tablet, Rfl: 0 .  Respiratory Therapy Supplies (FLUTTER) DEVI, 1 each by Does not apply route daily., Disp: 1 each, Rfl: 0 .  simvastatin (ZOCOR) 20 MG tablet, Take 1 tablet (20 mg total) by mouth at bedtime., Disp: 90 tablet, Rfl: 3 .  triamcinolone cream (KENALOG) 0.1 %, Apply 1 application topically 2 (two) times daily. (Patient taking differently: Apply 1 application topically 2 (two) times daily as needed (rash). ), Disp: 45 g, Rfl: 0 .  VENTOLIN HFA 108 (90 Base) MCG/ACT inhaler, INHALE 2 PUFFS EVERY 6HRS AS NEEDED FOR WHEEZING OR SHORTNESS OF BREATH. DO NOT USE IF USING NEBS. (Patient taking differently: Inhale 2 puffs into the lungs every 6 (six) hours as needed for wheezing. ), Disp: 18 Inhaler, Rfl: 2  EXAM:  VITALS per patient if applicable:Ht 5\' 5"  (1.651 m)   Wt 165 lb (74.8 kg)   BMI 27.46  kg/m   GENERAL:  alert, appears well and in no acute distress  HEENT: atraumatic, conjunttiva clear, no obvious abnormalities on inspection.  LUNGS: on inspection no signs of respiratory distress, breathing rate appears normal, no obvious gross SOB, gasping or wheezing. On supplemental O2 per cannula.  CV: no obvious cyanosis  PSYCH/NEURO: pleasant and cooperative, no obvious depression or anxiety, speech and thought processing grossly intact  ASSESSMENT AND PLAN:  Discussed the following assessment and plan:  Insomnia, unspecified type - Plan: ALPRAZolam (XANAX) 0.5 MG tablet Trazodone d/c. Continua Alprazolam at bedtime. Good sleep hygiene as possible. Recommended. Try to avoid fluid intake 3-4 hours before bedtime. Fall precautions discussed.  Generalized anxiety disorder - Plan: ALPRAZolam (XANAX) 0.5 MG tablet Anxiety has been mildly worse, taking Alprazolam more often, a few years ago she was taking higher dose. She is tolerating medication well. Alprazolam 0.5 mg increased from once daily to twice daily as needed. We discussed some side effects of medications.  Chronic obstructive pulmonary disease, unspecified COPD type ([place]) - Plan: For home use only DME wheelchair cushion (seat and back) Symptomatic but stable. Continue Pulmicort 0.5 mg bid neb,Brovana bid, and Albuterol neb sl q 4 hours as needed. Follows with pulmonologist.  Shortness of breath - Plan: For home use only DME wheelchair cushion (seat and back) Dyspnea slightly improved after TAVR,per daughter report. Stable since procedure was done. Lung and cardiac disease, both contributing to problem. Anemia is mild ,so I do not think it is causing symptoms.  Chronic respiratory failure, unspecified whether with hypoxia or hypercapnia (HCC) supplemental O2. Follows with pulmonologist.   I discussed the assessment and treatment plan with the patient. She and her daughter were provided an opportunity to ask questions and all were  answered. They  agreed with the plan and demonstrated an understanding of the instructions.   Return in about 5 months (around 11/27/2019) for Anxiety.   [name], MD

## 2019-07-01 IMAGING — CT CT CHEST WITHOUT CONTRAST
2 of 3 series · 15 of 36 positions shown, 18 images · non-contrast
Comparison: CTA chest, abdomen, and pelvis 08/24/2018. Chest CT
12/28/2014.

CLINICAL DATA: Follow-up lung nodule.

EXAM:
CT CHEST WITHOUT CONTRAST
TECHNIQUE: Multidetector CT imaging of the chest was performed following the
standard protocol without IV contrast.

[Series 2: thorax · axial · 0.71mm/px · z∈[-236,+10]mm · 12 of 145 slices shown, 15 images]
[im 11/145  mediastinal]
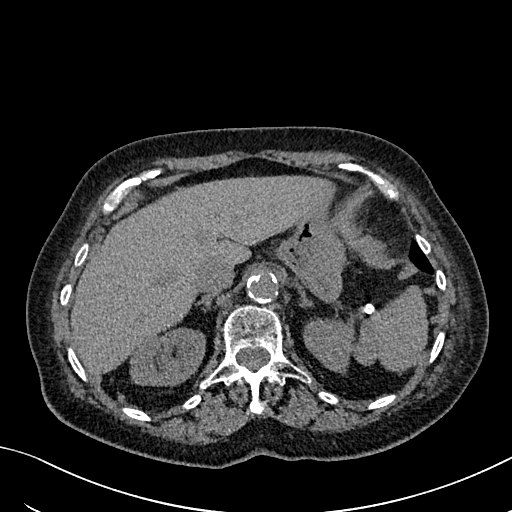
[im 11/145  lung]
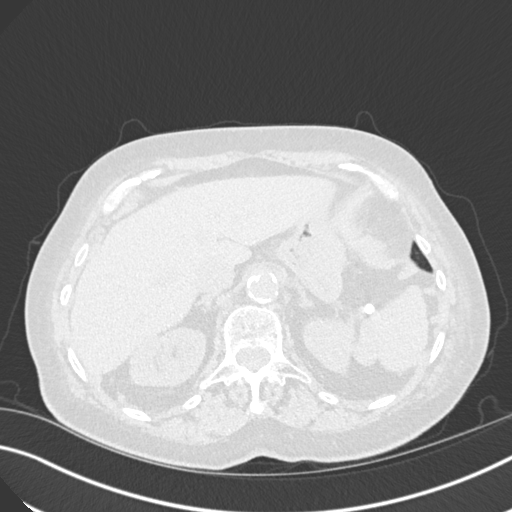
[im 22/145  lung]
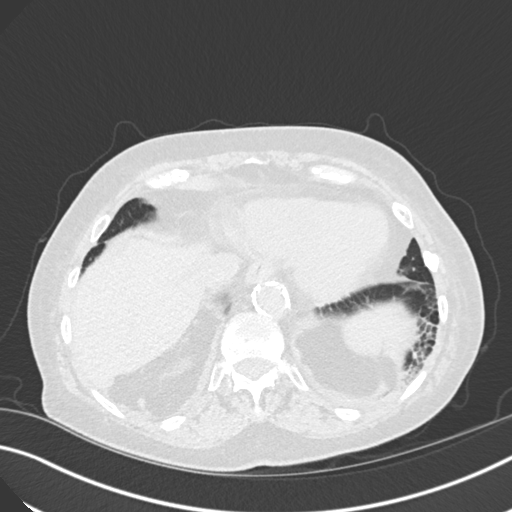
[im 33/145  lung]
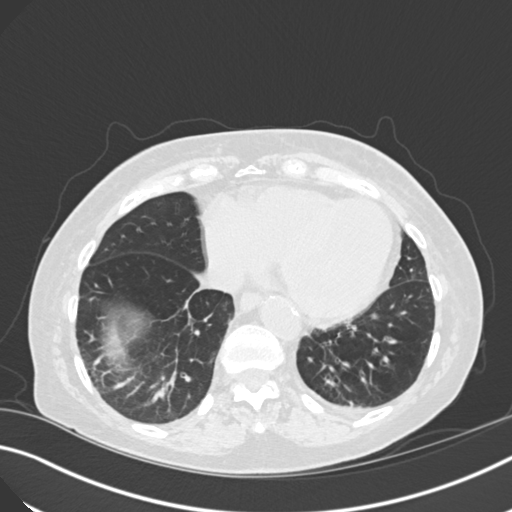
[im 43/145  lung]
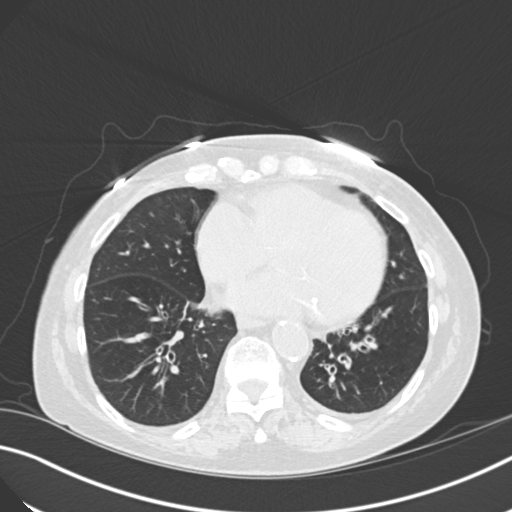
[im 54/145  mediastinal]
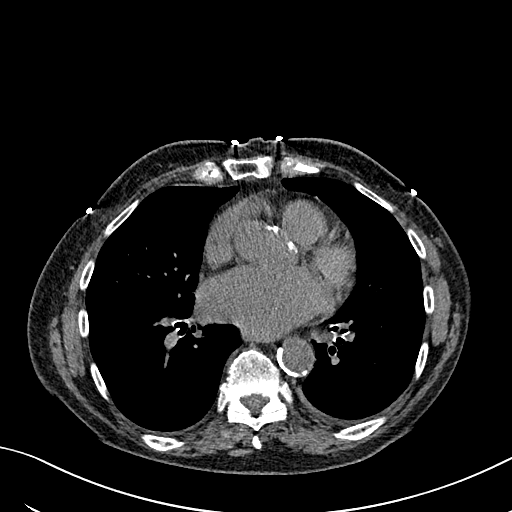
[im 54/145  lung]
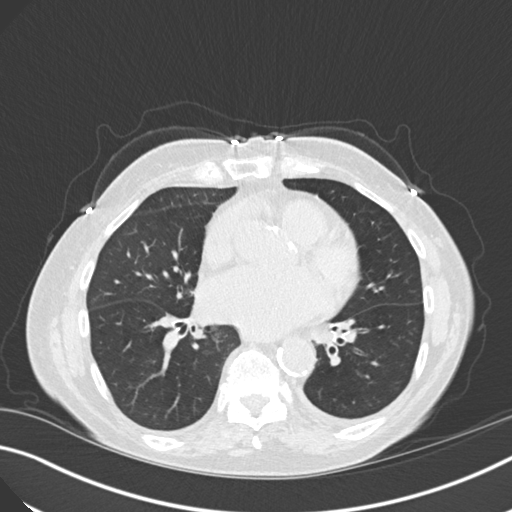
[im 65/145  lung]
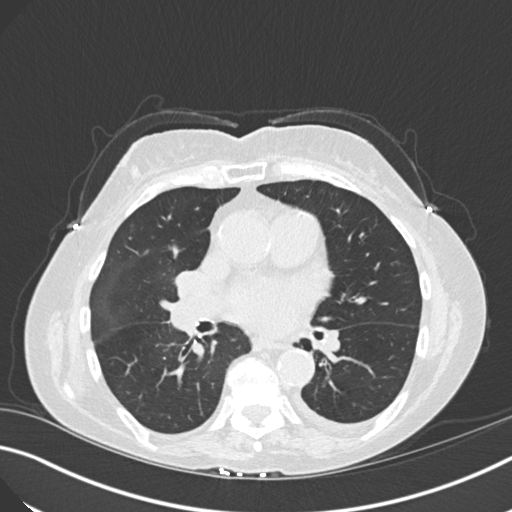
[im 81/145  lung]
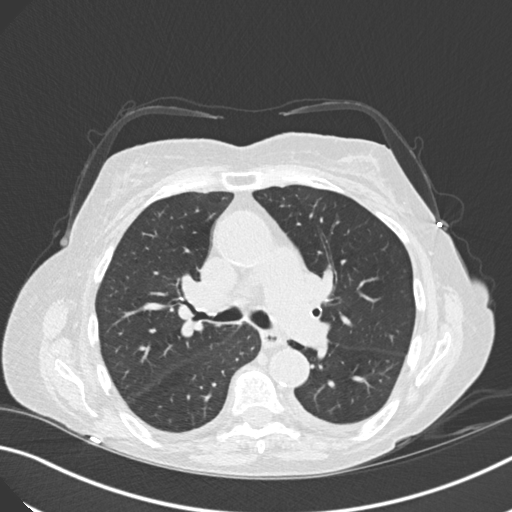
[im 91/145  lung]
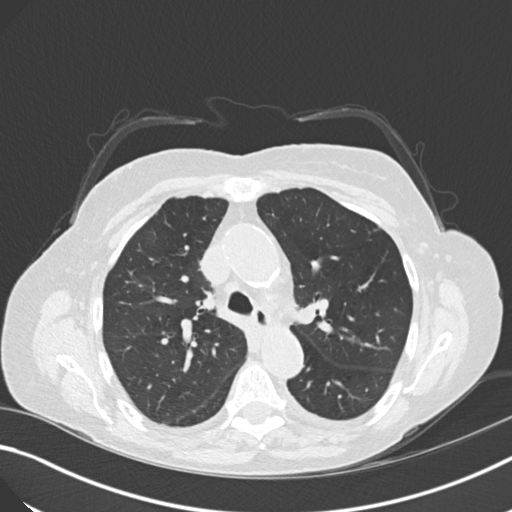
[im 102/145  mediastinal]
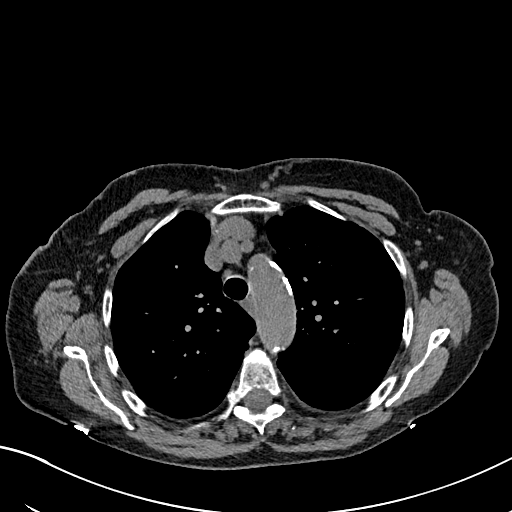
[im 102/145  lung]
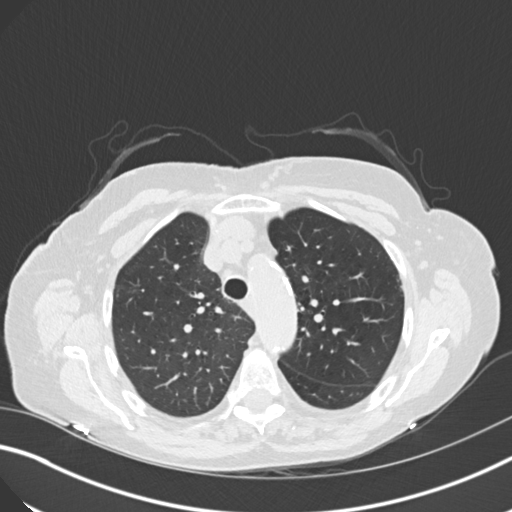
[im 113/145  lung]
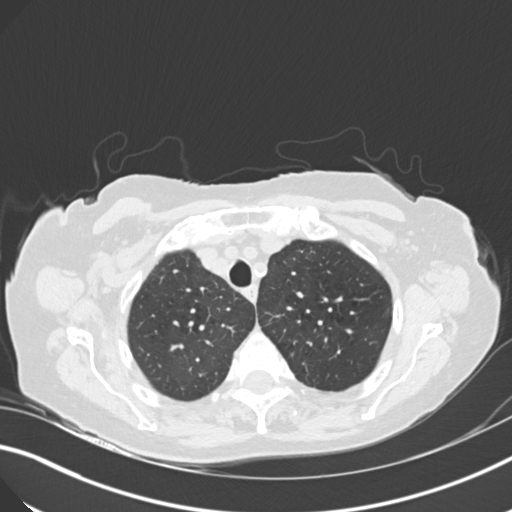
[im 123/145  lung]
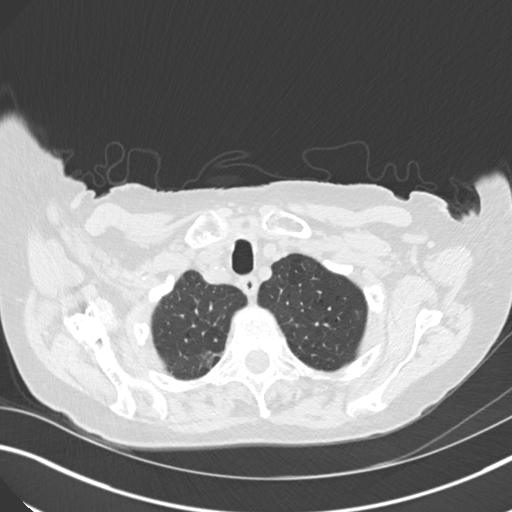
[im 134/145  lung]
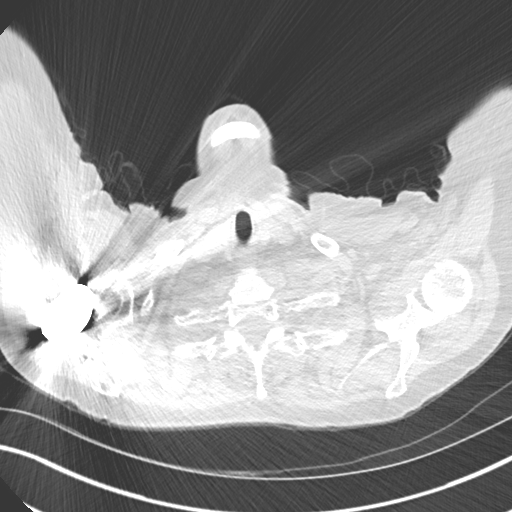

[Series 5: coronal · coronal · 0.59mm/px · 3 of 118 slices shown]
[im 24/118  lung]
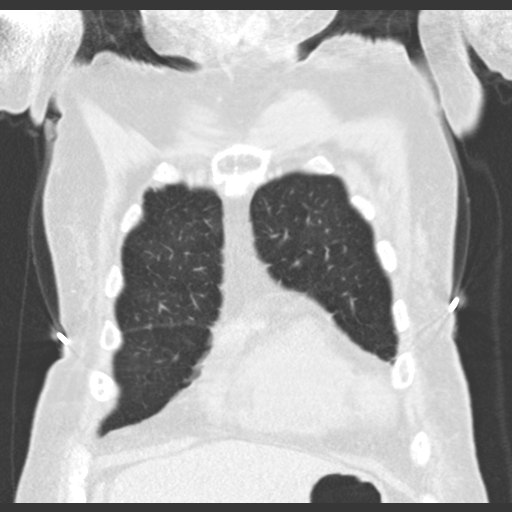
[im 47/118  lung]
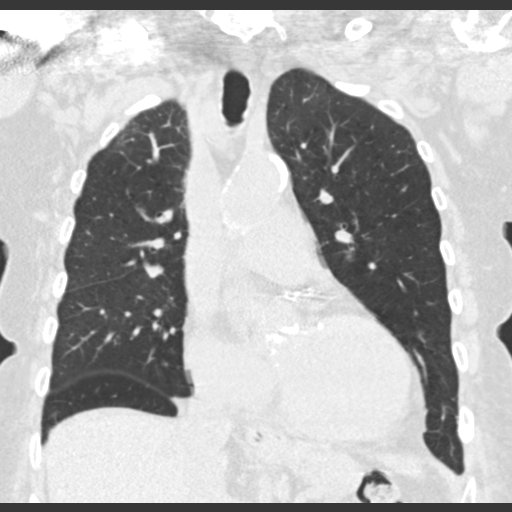
[im 71/118  lung]
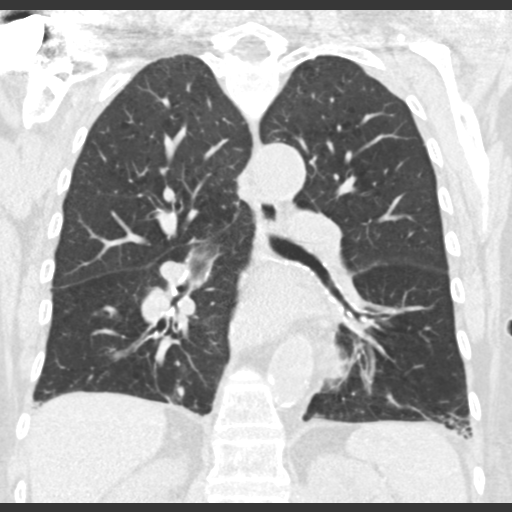

[15 of 36 positions shown; findings below may reference images not displayed]

FINDINGS: Cardiovascular: Thoracic aortic atherosclerosis without aneurysm.
LAD and left circumflex coronary artery atherosclerosis. Normal
heart size. No pericardial effusion. Prominent aortic valvular
calcification.

Mediastinum/Nodes: No enlarged axillary, mediastinal, or hilar lymph
nodes within limitations of noncontrast technique. Grossly
unremarkable thyroid within limitations of streak artifact from
right shoulder arthroplasty. Unremarkable esophagus.

Lungs/Pleura: Trace left pleural effusion. No pneumothorax. Mild
centrilobular emphysema and bronchial wall thickening. Mild
bibasilar scarring or fibrosis. Unchanged 5 mm basilar left lower
lobe nodule (series 3, image 123). There is a small amount of
subpleural reticulation and ground-glass density in the right upper
lobe in the location of the 8 x 6 mm solid nodule on the prior CTA,
however no significant residual solid nodule remains (series 3,
image 21 and series 6, image 54). No new suspicious nodule is
identified.

Upper Abdomen: Atherosclerosis. Partially visualized parapelvic
cysts in both kidneys.

Musculoskeletal: Right shoulder arthroplasty. Old lateral left
seventh and eighth rib fractures. No suspicious osseous lesion.
IMPRESSION: 1. Interval resolution of right upper lobe nodule consistent with an
inflammatory etiology. No additional follow-up needed.
2. No new lung nodule or acute abnormality in the chest.
3. Aortic Atherosclerosis (C4LBH-BQN.N) and Emphysema (C4LBH-GLV.G).

## 2019-08-10 ENCOUNTER — Ambulatory Visit (INDEPENDENT_AMBULATORY_CARE_PROVIDER_SITE_OTHER): Payer: Medicare Other | Admitting: Family Medicine

## 2019-08-10 ENCOUNTER — Encounter: Payer: Self-pay | Admitting: Family Medicine

## 2019-08-10 ENCOUNTER — Other Ambulatory Visit: Payer: Self-pay

## 2019-08-10 VITALS — BP 110/60 | HR 89 | Temp 98.1°F | Resp 16 | Ht 65.0 in | Wt 136.5 lb

## 2019-08-10 DIAGNOSIS — F411 Generalized anxiety disorder: Secondary | ICD-10-CM

## 2019-08-10 DIAGNOSIS — I2510 Atherosclerotic heart disease of native coronary artery without angina pectoris: Secondary | ICD-10-CM

## 2019-08-10 DIAGNOSIS — Z23 Encounter for immunization: Secondary | ICD-10-CM

## 2019-08-10 DIAGNOSIS — Z7189 Other specified counseling: Secondary | ICD-10-CM

## 2019-08-10 DIAGNOSIS — L821 Other seborrheic keratosis: Secondary | ICD-10-CM

## 2019-08-10 DIAGNOSIS — R634 Abnormal weight loss: Secondary | ICD-10-CM | POA: Diagnosis not present

## 2019-08-10 DIAGNOSIS — J449 Chronic obstructive pulmonary disease, unspecified: Secondary | ICD-10-CM | POA: Diagnosis not present

## 2019-08-10 DIAGNOSIS — R0600 Dyspnea, unspecified: Secondary | ICD-10-CM | POA: Diagnosis not present

## 2019-08-10 DIAGNOSIS — E538 Deficiency of other specified B group vitamins: Secondary | ICD-10-CM

## 2019-08-10 DIAGNOSIS — R0609 Other forms of dyspnea: Secondary | ICD-10-CM

## 2019-08-10 DIAGNOSIS — R54 Age-related physical debility: Secondary | ICD-10-CM

## 2019-08-10 MED ORDER — TRIAMCINOLONE ACETONIDE 0.1 % EX CREA: 1.0000 "application " | TOPICAL_CREAM | Freq: Two times a day (BID) | CUTANEOUS | 1 refills | Status: DC | PRN

## 2019-08-10 NOTE — Progress Notes (Signed)
ACUTE VISIT   HPI:  Chief Complaint  Patient presents with  . Follow-up    medication concerns and requesting bloodwork    Jessica [name] is a 85 y.o. female with history of insomnia, anxiety, HFpEF, CAD, and severe aortic stenosis s/p TAVR who is here today with her daughter, who is concerned about weight loss, balance issues, persistent shortness of breath, and would like to review some of her medications. Her daughter also would like some blood work done.  Jessica [name] has hearing loss,so daughter helps me with interrogation.  On 03/29/2019 Jessica [name] underwent TAVR procedure, according to her daughter, her balance has not been the same since hospital discharge.  Jessica [name] is using a wheel chair to go for appts. At home Jessica [name] still uses a cane and a walker but needs extra help to move around.  Jessica [name] needs assistance with dressing, grooming, and bathing.  Still  having shortness of breath, it seems improved " a little" after procedure. Jessica [name] was on O2 supplementation until 04/2019. Pulse ox at home at ambient air 89% occasionally, most of the time above 90%. Negative for associated chest pain, diaphoresis, or palpitations.  Jessica [name] gets very anxious when Jessica [name] feels SOB.  Currently Jessica [name] is on Aspirin 81 mg daily. According to patient, Coumadin 5 mg daily was discontinued because of anemia, thought to be caused by blood loss.  Anemia improved after Coumadin was discontinued, Jessica [name] was placed on Aspirin. Her daughter wonders if Jessica [name] needs to go back to Coumadin. Currently Jessica [name] is on ferrous sulfate daily, her daughter wants to know if Jessica [name] needs to continue taking it, Jessica [name] has history of constipation. Jessica [name] has not noted blood in the stool, melena, or gross hematuria. + Easy bruising.  Productive cough with clear sputum. Jessica [name] has not noted wheezing. COPD, Jessica [name] follows with pulmonologist annually. Currently Jessica [name] is on Brovana nebulizations twice daily, albuterol nebulizer solution every 4 hours as needed, and  Pulmicort twice daily. Negative for fever, chills, or sore throat. Mild rhinorrhea and occasional nasal congestion. No sick contacts or recent travel.   Lab Results  Component Value Date   VITAMINB12 136 (L) 03/31/2019  Jessica [name] is not on B12 supplementation.  Lab Results  Component Value Date   ALT 15 03/25/2019   AST 20 03/25/2019   ALKPHOS 96 03/25/2019   BILITOT 0.7 03/25/2019   Lab Results  Component Value Date   CREATININE 0.77 05/11/2019   BUN 8 (L) 05/11/2019   NA 143 05/11/2019   K 3.9 05/11/2019   CL 99 05/11/2019   CO2 27 05/11/2019   Lab Results  Component Value Date   WBC 6.4 05/11/2019   HGB 11.3 05/11/2019   HCT 36.1 05/11/2019   MCV 88 05/11/2019   PLT 258 05/11/2019   Daughter is also concerned about weight loss despite the fact Jessica [name] is still eating. According to the daughter, Jessica [name] eats what Jessica [name] cooks for her but has complained of having no appetite.  Anxiety: Currently Jessica [name] is on alprazolam 0.5 mg twice daily as needed and Lexapro 20 mg daily. Anxiety has been aggravated by social isolation and by the fact Jessica [name] cannot do a lot around her house.  Cervical,lumbar,and knee pain.  Her daughter thinks it is related to the way Jessica [name] sleeps.  Jessica [name] is getting up in the middle of the night to go to the bathroom and goes back to sleep on her recliner instead of going back to her bed. Jessica [name] has history of fibromyalgia.  No recent trauma.  Also complaining about a pruritic "white crusty spot" in the middle of her back, Jessica [name] has had it for a while. Jessica [name] keeps scratching it with a "stick", causing irritation. Jessica [name] has no use of OTC medications. Jessica [name] denies having any pain. Her daughter has not noted any drainage.   Review of Systems  Constitutional: Positive for activity change, appetite change and fatigue. Negative for fever.  HENT: Negative for mouth sores and nosebleeds.   Eyes: Negative for redness and visual disturbance.  Cardiovascular: Negative for leg swelling.   Gastrointestinal: Negative for abdominal pain, nausea and vomiting.       Negative for changes in bowel habits.  Genitourinary: Negative for decreased urine volume and dysuria.  Skin: Negative for wound.  Allergic/Immunologic: Positive for environmental allergies.  Neurological: Negative for syncope and headaches.  Psychiatric/Behavioral: Positive for sleep disturbance. Negative for confusion. The patient is nervous/anxious.   Rest see pertinent positives and negatives per HPI.   Current Outpatient Medications on File Prior to Visit  Medication Sig Dispense Refill  . albuterol (PROVENTIL) (2.5 MG/3ML) 0.083% nebulizer solution Take 3 mLs (2.5 mg total) by nebulization every 4 (four) hours as needed for wheezing or shortness of breath. DX: COPD J44.9 75 mL 12  . ALPRAZolam (XANAX) 0.5 MG tablet Take 1 tablet (0.5 mg total) by mouth 2 (two) times daily as needed for anxiety. 60 tablet 2  . AMBULATORY NON FORMULARY MEDICATION Medication Name: Incentive spirometer Use 10-15 times per day 1 each 0  . arformoterol (BROVANA) 15 MCG/2ML NEBU Take 2 mLs (15 mcg total) by nebulization 2 (two) times daily. DX: COPD J44.9 120 mL 12  . aspirin EC 81 MG tablet Take 1 tablet (81 mg total) by mouth daily.    . budesonide (PULMICORT) 0.5 MG/2ML nebulizer solution Take 2 mLs (0.5 mg total) by nebulization 2 (two) times daily. DX: COPD J44.9 120 mL 12  . escitalopram (LEXAPRO) 20 MG tablet Take 1 tablet (20 mg total) by mouth daily. 90 tablet 1  . ferrous sulfate 325 (65 FE) MG tablet Take 1 tablet (325 mg total) by mouth daily. 90 tablet 3  . fluticasone (FLONASE) 50 MCG/ACT nasal spray Place 2 sprays into both nostrils daily. 16 g 0  . furosemide (LASIX) 20 MG tablet Take 1 tablet (20 mg total) by mouth daily. 90 tablet 3  . guaiFENesin (MUCINEX) 600 MG 12 hr tablet Take 1 tablet (600 mg total) by mouth 2 (two) times daily. 14 tablet 0  . loratadine (CLARITIN) 10 MG tablet Take 1 tablet (10 mg total) by  mouth daily. 10 tablet 0  . pantoprazole (PROTONIX) 40 MG tablet Take 1 tablet (40 mg total) by mouth daily at 12 noon. 30 tablet 0  . Respiratory Therapy Supplies (FLUTTER) DEVI 1 each by Does not apply route daily. 1 each 0  . simvastatin (ZOCOR) 20 MG tablet Take 1 tablet (20 mg total) by mouth at bedtime. 90 tablet 3  . VENTOLIN HFA 108 (90 Base) MCG/ACT inhaler INHALE 2 PUFFS EVERY 6HRS AS NEEDED FOR WHEEZING OR SHORTNESS OF BREATH. DO NOT USE IF USING NEBS. (Patient taking differently: Inhale 2 puffs into the lungs every 6 (six) hours as needed for wheezing. ) 18 Inhaler 2   No current facility-administered medications on file prior to visit.     Past Medical History:  Diagnosis Date  . Anxiety   . congenital nystagmus   . COPD (chronic obstructive pulmonary disease) ([employer])   .  Coronary artery disease   . DJD (degenerative joint disease)   . Fibromyalgia   . Low back pain syndrome   . Memory loss   . Other and unspecified hyperlipidemia   . Pancreatic lesion   . Pneumonia   . S/P TAVR (transcatheter aortic valve replacement)    26 mm Edwards Sapien 3 via the TF approach  . Severe aortic stenosis   . Venous insufficiency    Past Surgical History:  Procedure Laterality Date  . ABDOMINAL HYSTERECTOMY    . ANTERIOR CERVICAL DISCECTOMY    . APPENDECTOMY    . CARPAL TUNNEL RELEASE  12/2011   right arm  . CATARACT EXTRACTION    . CORONARY ATHERECTOMY  07/23/2018   PTCA/orbital atherectomy/DES x 1 proximal to mid LAD  . CORONARY ATHERECTOMY N/A 07/23/2018   Procedure: CORONARY ATHERECTOMY;  Surgeon: Burnell Blanks, MD;  Location: [facility];  Service: Cardiovascular;  Laterality: N/A;  . CORONARY STENT INTERVENTION    . CORONARY STENT INTERVENTION N/A 07/23/2018   Procedure: CORONARY STENT INTERVENTION;  Surgeon: Burnell Blanks, MD;  Location: [facility];  Service: Cardiovascular;  Laterality: N/A;  . EYE SURGERY    . LUMBAR LAMINECTOMY    .  right knee arthroscopy    . right shoulder replacement  04/2009  . right shoulder surgery  2008   Dr. Noemi Chapel  . RIGHT/LEFT HEART CATH AND CORONARY ANGIOGRAPHY N/A 07/07/2018   Procedure: RIGHT/LEFT HEART CATH AND CORONARY ANGIOGRAPHY;  Surgeon: Burnell Blanks, MD;  Location: [facility];  Service: Cardiovascular;  Laterality: N/A;  . TEE WITHOUT CARDIOVERSION N/A 03/29/2019   Procedure: TRANSESOPHAGEAL ECHOCARDIOGRAM (TEE);  Surgeon: Burnell Blanks, MD;  Location: [facility];  Service: Open Heart Surgery;  Laterality: N/A;  . TRANSCATHETER AORTIC VALVE REPLACEMENT, TRANSFEMORAL N/A 03/29/2019   Procedure: TRANSCATHETER AORTIC VALVE REPLACEMENT, TRANSFEMORAL;  Surgeon: Burnell Blanks, MD;  Location: [facility];  Service: Open Heart Surgery;  Laterality: N/A;  . ULTRASOUND GUIDANCE FOR VASCULAR ACCESS  07/07/2018   Procedure: Ultrasound Guidance For Vascular Access;  Surgeon: Burnell Blanks, MD;  Location: [facility];  Service: Cardiovascular;;    Allergies  Allergen Reactions  . Contrast Media [Iodinated Diagnostic Agents] Itching, Rash and Other (See Comments)    Hypotension, Skin turns red like a sunburn  . Iohexol Itching, Rash and Other (See Comments)    Hypotension, Skin turns red like a sunburn    Social History   Socioeconomic History  . Marital status: Widowed    Spouse name: Not on file  . Number of children: 2  . Years of education: Not on file  . Highest education level: Not on file  Occupational History  . Occupation: [employer]  Social Needs  . Financial resource strain: Not on file  . Food insecurity    Worry: Not on file    Inability: Not on file  . Transportation needs    Medical: Not on file    Non-medical: Not on file  Tobacco Use  . Smoking status: Former Smoker    Packs/day: 2.00    Years: 30.00    Pack years: 60.00    Types: Cigarettes    Quit date: 10/28/1991    Years since quitting: 27.8  .  Smokeless tobacco: Never Used  Substance and Sexual Activity  . Alcohol use: Yes    Comment: 1 drink per night  . Drug use: No  . Sexual activity:  Not Currently  Lifestyle  . Physical activity    Days per week: Not on file    Minutes per session: Not on file  . Stress: Not on file  Relationships  . Social Herbalist on phone: Not on file    Gets together: Not on file    Attends religious service: Not on file    Active member of club or organization: Not on file    Attends meetings of clubs or organizations: Not on file    Relationship status: Not on file  Other Topics Concern  . Not on file  Social History Narrative   1 sibling alive age 23   1 sibling alive age 16   1 sibling deceased age 21   1 sibling alive age 24    Vitals:   08/10/19 1211  BP: 110/60  Pulse: 89  Resp: 16  Temp: 98.1 F (36.7 C)  SpO2: 91%   Body mass index is 22.71 kg/m.  Wt Readings from Last 3 Encounters:  08/10/19 136 lb 8 oz (61.9 kg)  06/30/19 165 lb (74.8 kg)  05/11/19 154 lb (69.9 kg)    Physical Exam  Nursing note and vitals reviewed. Constitutional: Jessica [name] appears well-developed and well-nourished. No distress.  HENT:  Head: Normocephalic and atraumatic.  Right Ear: Decreased hearing is noted.  Left Ear: Decreased hearing is noted.  Mouth/Throat: Oropharynx is clear and moist and mucous membranes are normal.  Eyes: Conjunctivae are normal.  Cardiovascular: Normal rate and regular rhythm.  Murmur (SEM I-IIRUSB ) heard. Respiratory: Effort normal and breath sounds normal. No respiratory distress.  GI: Soft. There is no abdominal tenderness.  Musculoskeletal:        General: No edema.  Neurological: Jessica [name] is alert. Jessica [name] has normal strength. No cranial nerve deficit. Gait abnormal.  Oriented in time and self. No focal deficit appreciated. Jessica [name] is in a wheel chair.  Skin: Skin is warm. No rash noted. No erythema.  Raised rounded lesion on back,mildly irritated from  scratching,keratotic. See picture.  Psychiatric: Her mood appears anxious.  Well groomed, good eye contact.        ASSESSMENT AND PLAN:  Jessica [name] was seen today for follow-up.  Diagnoses and all orders for this visit:  Exertional dyspnea Chronic problem. Her daughter does not think it was significant improvement with TVAR procedure. Deconditioning and COPD can be contribute factors. Echo 05/11/19: LVEF 123456, diastolic dysfunction.  CAD,07/23/18: Prox LAD lesion is 80% stenosed.  Ost 1st Mrg to 1st Mrg lesion is 40% stenosed.  A drug-eluting stent was successfully placed using a STENT SYNERGY DES 3X16.        Post intervention, there is a 0% residual stenosis.  Plavix discontinued due to anemia. Aspirin side effects discussed.  BP normal lower range, Jessica [name] is not longer on Metoprolol.  Instructed about warning signs.  B12 deficiency Recommend sublingual B12 [phone] mcg daily.  Chronic obstructive pulmonary disease, unspecified COPD type ([place]) This problem most likely contributing significantly to her DOE. Following with pulmonologist. Jessica [name] can resume Mucinex to help with sputum. Today lung auscultation is clear.  Frailty Some declining since hospital discharge. We need to establish goals for care, recommend avoiding aggressive interventions for non threading conditions. Adequate controlled of chronic disease. We reviewed medications and side effects. Fall precautions.  Weight loss, non-intentional Last wt here in the office on 12/21/18: 138.6 Lb. Wt before TAVR 156 Lb. No restrictions in regard to diet. Ensure 1-2 daily. We will  continue monitoring.  Seborrheic keratosis Irritated. We discussed differential Dx's, including SCC. Topical steroid to controlled pruritus, avoid scratching.  Counseling regarding end of life decision making I think Jessica [name] would benefit from palliative care, recommend to consider. Educated about differences between palliative and  hospice care. Her daughter would like to hold on palliative referral,Jessica [name] will have a discussion with her mother and will call me back.  Need for influenza vaccination -     Flu Vaccine QUAD High Dose(Fluad)  Generalized anxiety disorder Continue Lexapro 20 mg and Alprazolam 0.5 mg bid as needed. We discussed side effects of medications.  40 min face to face OV. > 50% was dedicated to discussion of Dx, prognosis, treatment options, and some side effects of medications as well as trying to establish goals of care.   Return in about 4 months (around 12/11/2019).   Saurabh Hettich G. [name], MD  [facility]. [place] office.

## 2019-08-10 NOTE — Patient Instructions (Signed)
A few things to remember from today's visit:   Stage 3a chronic kidney disease  B12 deficiency  Exertional dyspnea  Chronic obstructive pulmonary disease, unspecified COPD type ([place])  Component 25mo ago  ABO/RH(D) A POS   Antibody Screen NEG   Sample Expiration 04/08/2019,2359   Extend sample     Mucinex may help with sputum. B12 over the counter, sublingual. She can stop iron. Continue Aspirin for now, monitor for signs of bleeding. Consider Palliative care.

## 2019-08-28 ENCOUNTER — Other Ambulatory Visit: Payer: Self-pay | Admitting: Internal Medicine

## 2019-08-28 DIAGNOSIS — J449 Chronic obstructive pulmonary disease, unspecified: Secondary | ICD-10-CM

## 2019-09-16 ENCOUNTER — Telehealth: Payer: Self-pay | Admitting: Family Medicine

## 2019-09-16 NOTE — Telephone Encounter (Signed)
Pt's daughter stated pt had been hurting for two days and that got better. Last night at 2:30am pt's right knee was hurting so bad she was screaming. Daughter gave her a vicodin and propped up her knee. Pt fell asleep. Pt's leg was weak this morning and she was using a walker. They don't know if she fell at some point using her walker. Daughter would like to know if it happens again, what should they do. Please advise. Pt is okay right now. CB#[phone]

## 2019-09-17 ENCOUNTER — Emergency Department (HOSPITAL_COMMUNITY): Payer: Medicare Other

## 2019-09-17 ENCOUNTER — Encounter (HOSPITAL_COMMUNITY): Payer: Self-pay | Admitting: Emergency Medicine

## 2019-09-17 ENCOUNTER — Inpatient Hospital Stay (HOSPITAL_COMMUNITY)
Admission: EM | Admit: 2019-09-17 | Discharge: 2019-09-26 | DRG: 481 | Disposition: A | Discharge status: Skilled Nursing Facility | Payer: Medicare Other | Attending: Student | Admitting: Student

## 2019-09-17 DIAGNOSIS — Z419 Encounter for procedure for purposes other than remedying health state, unspecified: Secondary | ICD-10-CM

## 2019-09-17 DIAGNOSIS — Z20828 Contact with and (suspected) exposure to other viral communicable diseases: Secondary | ICD-10-CM | POA: Diagnosis not present

## 2019-09-17 DIAGNOSIS — D62 Acute posthemorrhagic anemia: Secondary | ICD-10-CM | POA: Diagnosis not present

## 2019-09-17 DIAGNOSIS — Z03818 Encounter for observation for suspected exposure to other biological agents ruled out: Secondary | ICD-10-CM | POA: Diagnosis not present

## 2019-09-17 DIAGNOSIS — Z7982 Long term (current) use of aspirin: Secondary | ICD-10-CM

## 2019-09-17 DIAGNOSIS — M84451A Pathological fracture, right femur, initial encounter for fracture: Secondary | ICD-10-CM | POA: Diagnosis not present

## 2019-09-17 DIAGNOSIS — J449 Chronic obstructive pulmonary disease, unspecified: Secondary | ICD-10-CM | POA: Diagnosis present

## 2019-09-17 DIAGNOSIS — I5032 Chronic diastolic (congestive) heart failure: Secondary | ICD-10-CM | POA: Diagnosis not present

## 2019-09-17 DIAGNOSIS — I2510 Atherosclerotic heart disease of native coronary artery without angina pectoris: Secondary | ICD-10-CM | POA: Diagnosis present

## 2019-09-17 DIAGNOSIS — Z79899 Other long term (current) drug therapy: Secondary | ICD-10-CM

## 2019-09-17 DIAGNOSIS — N179 Acute kidney failure, unspecified: Secondary | ICD-10-CM | POA: Diagnosis not present

## 2019-09-17 DIAGNOSIS — R262 Difficulty in walking, not elsewhere classified: Secondary | ICD-10-CM

## 2019-09-17 DIAGNOSIS — W06XXXA Fall from bed, initial encounter: Secondary | ICD-10-CM | POA: Diagnosis present

## 2019-09-17 DIAGNOSIS — L219 Seborrheic dermatitis, unspecified: Secondary | ICD-10-CM | POA: Diagnosis present

## 2019-09-17 DIAGNOSIS — Z955 Presence of coronary angioplasty implant and graft: Secondary | ICD-10-CM

## 2019-09-17 DIAGNOSIS — F411 Generalized anxiety disorder: Secondary | ICD-10-CM | POA: Diagnosis not present

## 2019-09-17 DIAGNOSIS — S72001A Fracture of unspecified part of neck of right femur, initial encounter for closed fracture: Secondary | ICD-10-CM | POA: Diagnosis not present

## 2019-09-17 DIAGNOSIS — S199XXA Unspecified injury of neck, initial encounter: Secondary | ICD-10-CM | POA: Diagnosis not present

## 2019-09-17 DIAGNOSIS — J9611 Chronic respiratory failure with hypoxia: Secondary | ICD-10-CM | POA: Diagnosis not present

## 2019-09-17 DIAGNOSIS — M1611 Unilateral primary osteoarthritis, right hip: Secondary | ICD-10-CM | POA: Diagnosis present

## 2019-09-17 DIAGNOSIS — M797 Fibromyalgia: Secondary | ICD-10-CM | POA: Diagnosis present

## 2019-09-17 DIAGNOSIS — M25551 Pain in right hip: Secondary | ICD-10-CM

## 2019-09-17 DIAGNOSIS — Z96611 Presence of right artificial shoulder joint: Secondary | ICD-10-CM | POA: Diagnosis present

## 2019-09-17 DIAGNOSIS — S0990XA Unspecified injury of head, initial encounter: Secondary | ICD-10-CM | POA: Diagnosis not present

## 2019-09-17 DIAGNOSIS — E785 Hyperlipidemia, unspecified: Secondary | ICD-10-CM | POA: Diagnosis present

## 2019-09-17 DIAGNOSIS — M25451 Effusion, right hip: Secondary | ICD-10-CM | POA: Diagnosis not present

## 2019-09-17 DIAGNOSIS — Z86711 Personal history of pulmonary embolism: Secondary | ICD-10-CM

## 2019-09-17 DIAGNOSIS — H9190 Unspecified hearing loss, unspecified ear: Secondary | ICD-10-CM | POA: Diagnosis present

## 2019-09-17 DIAGNOSIS — Z7951 Long term (current) use of inhaled steroids: Secondary | ICD-10-CM

## 2019-09-17 DIAGNOSIS — Z87891 Personal history of nicotine dependence: Secondary | ICD-10-CM

## 2019-09-17 DIAGNOSIS — Y92003 Bedroom of unspecified non-institutional (private) residence as the place of occurrence of the external cause: Secondary | ICD-10-CM

## 2019-09-17 DIAGNOSIS — L89151 Pressure ulcer of sacral region, stage 1: Secondary | ICD-10-CM | POA: Diagnosis present

## 2019-09-17 DIAGNOSIS — Z952 Presence of prosthetic heart valve: Secondary | ICD-10-CM

## 2019-09-17 DIAGNOSIS — Z91041 Radiographic dye allergy status: Secondary | ICD-10-CM

## 2019-09-17 DIAGNOSIS — W19XXXA Unspecified fall, initial encounter: Secondary | ICD-10-CM

## 2019-09-17 DIAGNOSIS — H5501 Congenital nystagmus: Secondary | ICD-10-CM | POA: Diagnosis present

## 2019-09-17 DIAGNOSIS — D638 Anemia in other chronic diseases classified elsewhere: Secondary | ICD-10-CM | POA: Diagnosis present

## 2019-09-17 DIAGNOSIS — I700 Atherosclerosis of aorta: Secondary | ICD-10-CM | POA: Diagnosis present

## 2019-09-17 DIAGNOSIS — L8990 Pressure ulcer of unspecified site, unspecified stage: Secondary | ICD-10-CM | POA: Insufficient documentation

## 2019-09-17 DIAGNOSIS — E559 Vitamin D deficiency, unspecified: Secondary | ICD-10-CM | POA: Diagnosis present

## 2019-09-17 LAB — TYPE AND SCREEN
ABO/RH(D): A POS
Antibody Screen: NEGATIVE

## 2019-09-17 LAB — CBC WITH DIFFERENTIAL/PLATELET
Abs Immature Granulocytes: 0.02 10*3/uL (ref 0.00–0.07)
Basophils Absolute: 0 10*3/uL (ref 0.0–0.1)
Basophils Relative: 0 %
Eosinophils Absolute: 0 10*3/uL (ref 0.0–0.5)
Eosinophils Relative: 0 %
HCT: 41 % (ref 36.0–46.0)
Hemoglobin: 13 g/dL (ref 12.0–15.0)
Immature Granulocytes: 0 %
Lymphocytes Relative: 7 %
Lymphs Abs: 0.6 10*3/uL — ABNORMAL LOW (ref 0.7–4.0)
MCH: 32.7 pg (ref 26.0–34.0)
MCHC: 31.7 g/dL (ref 30.0–36.0)
MCV: 103 fL — ABNORMAL HIGH (ref 80.0–100.0)
Monocytes Absolute: 0.5 10*3/uL (ref 0.1–1.0)
Monocytes Relative: 6 %
Neutro Abs: 7.2 10*3/uL (ref 1.7–7.7)
Neutrophils Relative %: 87 %
Platelets: 198 10*3/uL (ref 150–400)
RBC: 3.98 MIL/uL (ref 3.87–5.11)
RDW: 14.4 % (ref 11.5–15.5)
WBC: 8.2 10*3/uL (ref 4.0–10.5)
nRBC: 0 % (ref 0.0–0.2)

## 2019-09-17 LAB — BASIC METABOLIC PANEL
Anion gap: 10 (ref 5–15)
BUN: 12 mg/dL (ref 8–23)
CO2: 31 mmol/L (ref 22–32)
Calcium: 9.3 mg/dL (ref 8.9–10.3)
Chloride: 98 mmol/L (ref 98–111)
Creatinine, Ser: 0.83 mg/dL (ref 0.44–1.00)
GFR calc Af Amer: >60 mL/min (ref >60)
GFR calc non Af Amer: >60 mL/min (ref >60)
Glucose, Bld: 109 mg/dL — ABNORMAL HIGH (ref 70–99)
Potassium: 3.6 mmol/L (ref 3.5–5.1)
Sodium: 139 mmol/L (ref 135–145)

## 2019-09-17 LAB — PROTIME-INR
INR: 1 (ref 0.8–1.2)
Prothrombin Time: 13 seconds (ref 11.4–15.2)

## 2019-09-17 MED ORDER — MORPHINE SULFATE (PF) 2 MG/ML IV SOLN
1.5000 mg | INTRAVENOUS | Status: DC | PRN
  Administered 2019-09-18: 1.5 mg via INTRAVENOUS
  Filled 2019-09-17: qty 1

## 2019-09-17 MED ORDER — SIMVASTATIN 20 MG PO TABS: 20.0000 mg | ORAL_TABLET | Freq: Every day | ORAL | Status: DC

## 2019-09-17 MED ORDER — SIMVASTATIN 20 MG PO TABS
20.0000 mg | ORAL_TABLET | Freq: Every day | ORAL | Status: DC
  Administered 2019-09-17 – 2019-09-25 (×9): 20 mg via ORAL
  Filled 2019-09-17 (×9): qty 1

## 2019-09-17 MED ORDER — POLYETHYLENE GLYCOL 3350 17 G PO PACK: 17.0000 g | PACK | Freq: Every day | ORAL | Status: DC | PRN

## 2019-09-17 MED ORDER — HYDROCODONE-ACETAMINOPHEN 5-325 MG PO TABS
0.5000 | ORAL_TABLET | ORAL | Status: DC | PRN
  Administered 2019-09-18: 0.5 via ORAL
  Filled 2019-09-17: qty 1

## 2019-09-17 MED ORDER — FENTANYL CITRATE (PF) 100 MCG/2ML IJ SOLN
50.0000 ug | Freq: Once | INTRAMUSCULAR | Status: AC
  Administered 2019-09-17: 50 ug via INTRAVENOUS
  Filled 2019-09-17: qty 2

## 2019-09-17 MED ORDER — PREDNISONE 20 MG PO TABS
60.0000 mg | ORAL_TABLET | Freq: Once | ORAL | Status: AC
  Administered 2019-09-17: 60 mg via ORAL
  Filled 2019-09-17: qty 3

## 2019-09-17 MED ORDER — ALPRAZOLAM 0.5 MG PO TABS
0.5000 mg | ORAL_TABLET | Freq: Every day | ORAL | Status: DC
  Administered 2019-09-17 – 2019-09-25 (×9): 0.5 mg via ORAL
  Filled 2019-09-17 (×9): qty 1

## 2019-09-17 MED ORDER — ARFORMOTEROL TARTRATE 15 MCG/2ML IN NEBU
15.0000 ug | INHALATION_SOLUTION | Freq: Two times a day (BID) | RESPIRATORY_TRACT | Status: DC
  Administered 2019-09-18 – 2019-09-25 (×15): 15 ug via RESPIRATORY_TRACT
  Filled 2019-09-17 (×20): qty 2

## 2019-09-17 MED ORDER — ESCITALOPRAM OXALATE 10 MG PO TABS
20.0000 mg | ORAL_TABLET | Freq: Every morning | ORAL | Status: DC
  Administered 2019-09-18 – 2019-09-26 (×9): 20 mg via ORAL
  Filled 2019-09-17 (×3): qty 2
  Filled 2019-09-17: qty 1
  Filled 2019-09-17 (×3): qty 2
  Filled 2019-09-17: qty 1
  Filled 2019-09-17: qty 2

## 2019-09-17 MED ORDER — ALBUTEROL SULFATE (2.5 MG/3ML) 0.083% IN NEBU
2.5000 mg | INHALATION_SOLUTION | Freq: Four times a day (QID) | RESPIRATORY_TRACT | Status: DC | PRN
  Administered 2019-09-18: 01:00:00 2.5 mg via RESPIRATORY_TRACT
  Filled 2019-09-17: qty 3

## 2019-09-17 MED ORDER — HYDROCODONE-ACETAMINOPHEN 5-325 MG PO TABS
1.0000 | ORAL_TABLET | Freq: Once | ORAL | Status: AC
  Administered 2019-09-17: 1 via ORAL
  Filled 2019-09-17: qty 1

## 2019-09-17 NOTE — H&P (Signed)
History and Physical    Jessica [name] [facility] [id number] DOB: July 12, 1926 DOA: 09/17/2019  PCP: [name], [name], MD   Patient coming from: Home   Chief Complaint: Right hip pain   HPI: ROSSELLA [name] is a 85 y.o. female with medical history significant for severe aortic stenosis status post valve replacement, COPD with chronic hypoxic respiratory failure, fibromyalgia, generalized anxiety disorder, now presenting to the emergency department for evaluation of severe right hip pain that has kept her from ambulating.  Patient reports that she fell out of her bed 1 to 2 weeks ago, hit the back of her head on a piece of furniture, and had been experiencing some right hip pain since then.  She reports that she had forgotten about the fall until couple days ago when the pain began to get severe and keep her from ambulating.  She reports using a cane at baseline.  She denies headache, change in vision or hearing, or focal numbness or weakness.  She reports chronic respiratory problems, but no acute increasing cough or shortness of breath, and she also denies fevers or chills.  ED Course: Upon arrival to the ED, patient is found to be afebrile, saturating mid 90s on 1.5 L/min of supplemental oxygen, and with stable blood pressure.  EKG features a sinus rhythm.  Noncontrast head CT is negative for acute intracranial abnormality or skull fracture and cervical spine CT is negative for subluxation or fracture.  No acute fracture or dislocation of the right hip noted on CT.  Chemistry panel is unremarkable and CBC notable for macrocytosis without anemia.  Patient was treated with fentanyl, prednisone, and Norco in the emergency department.  COVID-19 screening test is in process.  Orthopedic surgery was consulted by the ED physician and recommended medical admission with MRI for further evaluation.  Review of Systems:  All other systems reviewed and apart from HPI, are negative.  Past Medical History:  Diagnosis  Date   Anxiety    congenital nystagmus    COPD (chronic obstructive pulmonary disease) (HCC)    Coronary artery disease    DJD (degenerative joint disease)    Fibromyalgia    Low back pain syndrome    Memory loss    Other and unspecified hyperlipidemia    Pancreatic lesion    Pneumonia    S/P TAVR (transcatheter aortic valve replacement)    26 mm Edwards Sapien 3 via the TF approach   Severe aortic stenosis    Venous insufficiency     Past Surgical History:  Procedure Laterality Date   ABDOMINAL HYSTERECTOMY     ANTERIOR CERVICAL DISCECTOMY     APPENDECTOMY     CARPAL TUNNEL RELEASE  12/2011   right arm   CATARACT EXTRACTION     CORONARY ATHERECTOMY  07/23/2018   PTCA/orbital atherectomy/DES x 1 proximal to mid LAD   CORONARY ATHERECTOMY N/A 07/23/2018   Procedure: CORONARY ATHERECTOMY;  Surgeon: Burnell Blanks, MD;  Location: [place] [facility];  Service: Cardiovascular;  Laterality: N/A;   CORONARY STENT INTERVENTION     CORONARY STENT INTERVENTION N/A 07/23/2018   Procedure: CORONARY STENT INTERVENTION;  Surgeon: Burnell Blanks, MD;  Location: [place] [facility];  Service: Cardiovascular;  Laterality: N/A;   EYE SURGERY     LUMBAR LAMINECTOMY     right knee arthroscopy     right shoulder replacement  04/2009   right shoulder surgery  2008   Dr. Noemi Chapel   RIGHT/LEFT HEART CATH [name] [place]  ANGIOGRAPHY N/A 07/07/2018   Procedure: RIGHT/LEFT HEART CATH AND CORONARY ANGIOGRAPHY;  Surgeon: Burnell Blanks, MD;  Location: [place] [facility];  Service: Cardiovascular;  Laterality: N/A;   TEE WITHOUT CARDIOVERSION N/A 03/29/2019   Procedure: TRANSESOPHAGEAL ECHOCARDIOGRAM (TEE);  Surgeon: Burnell Blanks, MD;  Location: [place];  Service: Open Heart Surgery;  Laterality: N/A;   TRANSCATHETER AORTIC VALVE REPLACEMENT, TRANSFEMORAL N/A 03/29/2019   Procedure: TRANSCATHETER AORTIC VALVE REPLACEMENT, TRANSFEMORAL;  Surgeon:  Burnell Blanks, MD;  Location: [place];  Service: Open Heart Surgery;  Laterality: N/A;   ULTRASOUND GUIDANCE FOR VASCULAR ACCESS  07/07/2018   Procedure: Ultrasound Guidance For Vascular Access;  Surgeon: Burnell Blanks, MD;  Location: [place] [facility];  Service: Cardiovascular;;     reports that she quit smoking about 27 years ago. Her smoking use included cigarettes. She has a 60.00 pack-year smoking history. She has never used smokeless tobacco. She reports current alcohol use. She reports that she does not use drugs.  Allergies  Allergen Reactions   Contrast Media [Iodinated Diagnostic Agents] Itching, Rash and Other (See Comments)    Hypotension, Skin turns red like a sunburn   Iohexol Itching, Rash and Other (See Comments)    Hypotension, Skin turns red like a sunburn    Family History  Problem Relation Age of Onset   Other Mother        broken hip   Other Father        kidney problems   Colon cancer Neg Hx    Stomach cancer Neg Hx    Rectal cancer Neg Hx    Pancreatic cancer Neg Hx      Prior to Admission medications   Medication Sig Start Date End Date Taking? Authorizing Provider  acetaminophen (TYLENOL) 500 MG tablet Take 1,000 mg by mouth every 6 (six) hours as needed for headache (pain).   Yes [provider]  albuterol (PROVENTIL) (2.5 MG/3ML) 0.083% nebulizer solution Take 3 mLs (2.5 mg total) by nebulization every 4 (four) hours as needed for wheezing or shortness of breath. DX: COPD J44.9 04/12/18  Yes Kasa, Maretta Bees, MD  albuterol (VENTOLIN HFA) 108 (90 Base) MCG/ACT inhaler Inhale 2 puffs into the lungs every 6 (six) hours as needed for wheezing. 08/29/19  Yes Kasa, Maretta Bees, MD  ALPRAZolam Duanne Moron) 0.5 MG tablet Take 1 tablet (0.5 mg total) by mouth 2 (two) times daily as needed for anxiety. Patient taking differently: Take 0.5 mg by mouth at bedtime.  06/27/19  Yes [name], [name], MD  arformoterol (BROVANA) 15 MCG/2ML NEBU Take 2 mLs  (15 mcg total) by nebulization 2 (two) times daily. DX: COPD J44.9 07/27/17  Yes Flora Lipps, MD  aspirin EC 81 MG tablet Take 1 tablet (81 mg total) by mouth daily. Patient taking differently: Take 81 mg by mouth at bedtime.  04/01/19  Yes [name] R, PA-C  budesonide (PULMICORT) 0.5 MG/2ML nebulizer solution Take 2 mLs (0.5 mg total) by nebulization 2 (two) times daily. DX: COPD J44.9 07/09/17  Yes Kasa, Maretta Bees, MD  escitalopram (LEXAPRO) 20 MG tablet Take 1 tablet (20 mg total) by mouth daily. Patient taking differently: Take 20 mg by mouth every morning.  06/30/18  Yes [name], [name], MD  fluticasone [facility]) 50 MCG/ACT nasal spray Place 2 sprays into both nostrils daily. Patient taking differently: Place 2 sprays into both nostrils daily as needed for allergies or rhinitis.  12/18/18  Yes Ghimire, Henreitta Leber, MD  simvastatin (ZOCOR) 20  MG tablet Take 1 tablet (20 mg total) by mouth at bedtime. 12/23/17  Yes [name], MD  triamcinolone cream (KENALOG) 0.1 % Apply 1 application topically 2 (two) times daily as needed (rash). 08/10/19  Yes [name], [name], MD  AMBULATORY NON FORMULARY MEDICATION Medication Name: Incentive spirometer Use 10-15 times per day 12/14/18   Flora Lipps, MD  ferrous sulfate 325 (65 FE) MG tablet Take 1 tablet (325 mg total) by mouth daily. Patient not taking: Reported on 09/17/2019 04/01/19 03/31/20  Eileen Stanford, PA-C  furosemide (LASIX) 20 MG tablet Take 1 tablet (20 mg total) by mouth daily. Patient not taking: Reported on 09/17/2019 04/01/19 03/31/20  Eileen Stanford, PA-C  guaiFENesin (MUCINEX) 600 MG 12 hr tablet Take 1 tablet (600 mg total) by mouth 2 (two) times daily. Patient not taking: Reported on 09/17/2019 12/18/18   Jonetta Osgood, MD  loratadine (CLARITIN) 10 MG tablet Take 1 tablet (10 mg total) by mouth daily. Patient not taking: Reported on 09/17/2019 12/18/18   Jonetta Osgood, MD  pantoprazole (PROTONIX) 40 MG tablet Take 1  tablet (40 mg total) by mouth daily at 12 noon. Patient not taking: Reported on 09/17/2019 12/18/18   Jonetta Osgood, MD  Respiratory Therapy Supplies (FLUTTER) DEVI 1 each by Does not apply route daily. 12/14/18   Flora Lipps, MD    Physical Exam: Vitals:   09/17/19 1654 09/17/19 1845 09/17/19 1900 09/17/19 1930  BP: (!) 152/77 139/72 (!) 147/60 (!) 151/73  Pulse: 93  96 80  Resp: (!) 26 (!) 21 (!) 21 18  Temp:      TempSrc:      SpO2: 95%  95% 95%    Constitutional: NAD, calm  Eyes: PERTLA, lids and conjunctivae normal ENMT: Mucous membranes are moist. Posterior pharynx clear of any exudate or lesions.   Neck: normal, supple, no masses, no thyromegaly Respiratory: no wheezing, no crackles. Normal respiratory effort. No accessory muscle use.  Cardiovascular: S1 & S2 heard, regular rate and rhythm. Trace ankle edema bilaterally. Abdomen: No distension, no tenderness, soft. Bowel sounds active.  Musculoskeletal: no clubbing / cyanosis. Severe right hip pain with active and passive ROM, no gross deformity, neurovascularly intact distally. No joint deformity upper and lower extremities.  Skin: no significant rashes, lesions, ulcers. Warm, dry, well-perfused. Neurologic: No gross facial asymmetry. Gross hearing deficit. Sensation to light touch intact. Moving all extremities.  Psychiatric: Alert and oriented to person, place, and situation. Very pleasant, cooperative.     Labs on Admission: I have personally reviewed following labs and imaging studies  CBC: Recent Labs  Lab 09/17/19 1547  WBC 8.2  NEUTROABS 7.2  HGB 13.0  HCT 41.0  MCV 103.0*  PLT 99991111   Basic Metabolic Panel: Recent Labs  Lab 09/17/19 1547  NA 139  K 3.6  CL 98  CO2 31  GLUCOSE 109*  BUN 12  CREATININE 0.83  CALCIUM 9.3   GFR: CrCl cannot be calculated (Unknown ideal weight.). Liver Function Tests: No results for input(s): AST, ALT, ALKPHOS, BILITOT, PROT, ALBUMIN in the last 168 hours. No  results for input(s): LIPASE, AMYLASE in the last 168 hours. No results for input(s): AMMONIA in the last 168 hours. Coagulation Profile: Recent Labs  Lab 09/17/19 1547  INR 1.0   Cardiac Enzymes: No results for input(s): CKTOTAL, CKMB, CKMBINDEX, TROPONINI in the last 168 hours. BNP (last 3 results) No results for input(s): PROBNP in the last 8760 hours. HbA1C: No  results for input(s): HGBA1C in the last 72 hours. CBG: No results for input(s): GLUCAP in the last 168 hours. Lipid Profile: No results for input(s): CHOL, HDL, LDLCALC, TRIG, CHOLHDL, LDLDIRECT in the last 72 hours. Thyroid Function Tests: No results for input(s): TSH, T4TOTAL, FREET4, T3FREE, THYROIDAB in the last 72 hours. Anemia Panel: No results for input(s): VITAMINB12, FOLATE, FERRITIN, TIBC, IRON, RETICCTPCT in the last 72 hours. Urine analysis:    Component Value Date/Time   COLORURINE YELLOW 03/25/2019 1007   APPEARANCEUR CLEAR 03/25/2019 1007   LABSPEC 1.012 03/25/2019 1007   PHURINE 6.0 03/25/2019 1007   GLUCOSEU NEGATIVE 03/25/2019 1007   GLUCOSEU NEGATIVE 07/06/2009 1057   HGBUR NEGATIVE 03/25/2019 [place] 03/25/2019 [place] 03/25/2019 1007   PROTEINUR NEGATIVE 03/25/2019 1007   UROBILINOGEN 0.2 07/06/2009 1057   NITRITE NEGATIVE 03/25/2019 1007   LEUKOCYTESUR NEGATIVE 03/25/2019 1007   Sepsis Labs: @LABRCNTIP (procalcitonin:4,lacticidven:4) )No results found for this or any previous visit (from the past 240 hour(s)).   Radiological Exams on Admission: Ct Head Wo Contrast  Result Date: 09/17/2019 CLINICAL DATA:  Minor head trauma. High clinical risk. EXAM: CT HEAD WITHOUT CONTRAST CT CERVICAL SPINE WITHOUT CONTRAST TECHNIQUE: Multidetector CT imaging of the head and cervical spine was performed following the standard protocol without intravenous contrast. Multiplanar CT image reconstructions of the cervical spine were also generated. COMPARISON:  09/09/2007  MRI brain. FINDINGS: CT HEAD FINDINGS Brain: Tiny chronic appearing right basal ganglia lacune. No evidence of parenchymal hemorrhage or extra-axial fluid collection. No mass lesion, mass effect, or midline shift. No CT evidence of acute infarction. Generalized cerebral volume loss. Nonspecific mild subcortical and periventricular white matter hypodensity, most in keeping with chronic small vessel ischemic change. No ventriculomegaly. Vascular: No acute abnormality. Skull: No evidence of calvarial fracture. Sinuses/Orbits: The visualized paranasal sinuses are essentially clear. Other:  The mastoid air cells are unopacified. CT CERVICAL SPINE FINDINGS Alignment: Normal cervical lordosis. No facet subluxation. Dens is well positioned between the lateral masses of C1. Skull base and vertebrae: No acute fracture. No primary bone lesion or focal pathologic process. Healed deformity in lateral right clavicle, partially visualized. Soft tissues and spinal canal: No prevertebral edema. No visible canal hematoma. Disc levels: Status post ACDF C3-4 with no evidence of hardware fracture or loosening. Ankylosis of the C3-4 disc. Marked degenerative disc disease at C4-5, C5-6 and C6-7. Mild-to-moderate bilateral cervical facet arthropathy. Mild degenerative foraminal stenosis on the left at C3-4. Moderate degenerative foraminal stenosis bilaterally at C4-5. Mild degenerative foraminal stenosis bilaterally at C5-6 and C6-7. Upper chest: No acute abnormality. Other: Visualized mastoid air cells appear clear. No discrete thyroid nodules. No pathologically enlarged cervical nodes. IMPRESSION: 1. No evidence of acute intracranial abnormality. No evidence of calvarial fracture. 2. Generalized cerebral volume loss and mild chronic small vessel ischemic changes in the cerebral white matter. Tiny chronic appearing right basal ganglia lacune. 3. No cervical spine fracture or subluxation. 4. Status post C3-4 ACDF with no evidence of  hardware complication. 5. Marked degenerative changes in the cervical spine as detailed. Electronically Signed   By: Ilona Sorrel M.D.   On: 09/17/2019 16:20   Ct Cervical Spine Wo Contrast  Result Date: 09/17/2019 CLINICAL DATA:  Minor head trauma. High clinical risk. EXAM: CT HEAD WITHOUT CONTRAST CT CERVICAL SPINE WITHOUT CONTRAST TECHNIQUE: Multidetector CT imaging of the head and cervical spine was performed following the standard protocol without intravenous contrast. Multiplanar CT image reconstructions of the cervical  spine were also generated. COMPARISON:  09/09/2007 MRI brain. FINDINGS: CT HEAD FINDINGS Brain: Tiny chronic appearing right basal ganglia lacune. No evidence of parenchymal hemorrhage or extra-axial fluid collection. No mass lesion, mass effect, or midline shift. No CT evidence of acute infarction. Generalized cerebral volume loss. Nonspecific mild subcortical and periventricular white matter hypodensity, most in keeping with chronic small vessel ischemic change. No ventriculomegaly. Vascular: No acute abnormality. Skull: No evidence of calvarial fracture. Sinuses/Orbits: The visualized paranasal sinuses are essentially clear. Other:  The mastoid air cells are unopacified. CT CERVICAL SPINE FINDINGS Alignment: Normal cervical lordosis. No facet subluxation. Dens is well positioned between the lateral masses of C1. Skull base and vertebrae: No acute fracture. No primary bone lesion or focal pathologic process. Healed deformity in lateral right clavicle, partially visualized. Soft tissues and spinal canal: No prevertebral edema. No visible canal hematoma. Disc levels: Status post ACDF C3-4 with no evidence of hardware fracture or loosening. Ankylosis of the C3-4 disc. Marked degenerative disc disease at C4-5, C5-6 and C6-7. Mild-to-moderate bilateral cervical facet arthropathy. Mild degenerative foraminal stenosis on the left at C3-4. Moderate degenerative foraminal stenosis bilaterally at  C4-5. Mild degenerative foraminal stenosis bilaterally at C5-6 and C6-7. Upper chest: No acute abnormality. Other: Visualized mastoid air cells appear clear. No discrete thyroid nodules. No pathologically enlarged cervical nodes. IMPRESSION: 1. No evidence of acute intracranial abnormality. No evidence of calvarial fracture. 2. Generalized cerebral volume loss and mild chronic small vessel ischemic changes in the cerebral white matter. Tiny chronic appearing right basal ganglia lacune. 3. No cervical spine fracture or subluxation. 4. Status post C3-4 ACDF with no evidence of hardware complication. 5. Marked degenerative changes in the cervical spine as detailed. Electronically Signed   By: Ilona Sorrel M.D.   On: 09/17/2019 16:20   Ct Hip Right Wo Contrast  Result Date: 09/17/2019 CLINICAL DATA:  Right hip pain after fall 1 week ago EXAM: CT OF THE RIGHT HIP WITHOUT CONTRAST TECHNIQUE: Multidetector CT imaging of the right hip was performed according to the standard protocol. Multiplanar CT image reconstructions were also generated. COMPARISON:  CT 08/24/2018 FINDINGS: Bones/Joint/Cartilage The right hip is intact without fracture or dislocation. Moderate osteoarthritis of the right hip joint with joint space narrowing, subchondral cystic changes, and marginal osteophyte formation. There is mineralization within the area of the anterosuperior labrum. Enthesopathic changes are present at the greater trochanter and ischial tuberosity. The right SI joint and pubic symphysis are intact with mild-to-moderate degenerative changes. There is chondrocalcinosis at the symphysis pubis. There is suggestion of a small hip joint effusion with a few small mineralized loose bodies. Ligaments Suboptimally assessed by CT. Muscles and Tendons Fatty infiltration of the gluteus minimus muscle likely related to chronic tendon tear. No evidence of an acute muscular or tendinous injury. Soft tissues No soft tissue hematoma or fluid  collection. IMPRESSION: 1. No acute fracture or dislocation of the right hip. 2. Moderate osteoarthritis of the right hip joint with small joint effusion and a few small mineralized loose bodies. Electronically Signed   By: Davina Poke M.D.   On: 09/17/2019 16:22    EKG: Independently reviewed. Sinus rhythm, PVC, LVH.   Assessment/Plan   1. Acute right hip pain  - Patient reports falling out of bed 1-2 wks ago and presents with severe right hip pain that is keeping her from ambulating - CT hip is negative for acute fracture, notable for small joint effusion and mineralized loose bodies  - Orthopedic surgery is  consulting and much appreciated, recommended admission to hospitalists and MRI right hip  - Continue pain-control, keep NPO after midnight, follow-up MRI right hip    2. COPD; chronic hypoxic respiratory failure   - Appears stable  - Continue supplemental O2, LABA, as-needed SABA   3. CAD  - No anginal complaints  - Continue statin, hold ASA pending further hip imaging    4. Chronic diastolic CHF  - EF was [id number] in July 2020  - Appears compensated, no longer taking Lasix, will SLIV    5. Anxiety  - Continue low-dose Xanax and Lexapro     PPE: Mask, face shield  DVT prophylaxis: SCD's  Code Status: Full, confirmed with patient  Family Communication: Discussed with patient  Consults called: Orthopedic surgery consulted by ED physician  Admission status: Observation     Vianne Bulls, MD [facility] Pager [phone]  If 7PM-7AM, please contact night-coverage [email or web address] Password [facility]  09/17/2019, 7:49 PM

## 2019-09-17 NOTE — ED Provider Notes (Signed)
[facility] EMERGENCY DEPARTMENT Provider Note   CSN: [id number] Arrival date & time: 09/17/19  1432     History   Chief Complaint Chief Complaint  Patient presents with   Hip Pain    HPI Jessica [name] is a 85 y.o. female.      Jessica [name] is a 85 y.o. female with a history of CAD, CHF, COPD, low back pain, memory loss and fibromyalgia, who presents to the emergency department for evaluation of right hip pain.  Patient was seen this morning by PA at [facility] orthopedic office for evaluation of right hip and shoulder pain after patient fell out 10 days ago.  X-rays of the right hip and shoulder were negative in the office but they still had high suspicion for hip fracture and sent patient in for further evaluation, Dr. Percell Miller who is on-call today has been notified.  Patient is very hard of hearing and able to provide limited history.  Patient's daughter reports that at baseline prior to this fall she is usually ambulatory with a cane and fairly independent.  Reports that she slid out of bed landing on her right hip about 10 days ago and initially seemed fine was able to walk around and did not have any obvious bruising or swelling but over the past week she has been complaining of increasing pain in the right hip and over the past 2 days especially she is complained of severe pain and has been unable to walk on the right leg.  Patient's daughter does report that when she initially fell she did hit her head and had a small cut to the scalp that has since healed.  She is also complained of some right shoulder pain radiating into her hand.  Denies numbness tingling or weakness in her extremities.  Denies neck pain.     Past Medical History:  Diagnosis Date   Anxiety    congenital nystagmus    COPD (chronic obstructive pulmonary disease) (HCC)    Coronary artery disease    DJD (degenerative joint disease)    Fibromyalgia    Low back pain syndrome     Memory loss    Other and unspecified hyperlipidemia    Pancreatic lesion    Pneumonia    S/P TAVR (transcatheter aortic valve replacement)    26 mm Edwards Sapien 3 via the TF approach   Severe aortic stenosis    Venous insufficiency     Patient Active Problem List   Diagnosis Date Noted   Acute right hip pain 09/17/2019   S/P TAVR (transcatheter aortic valve replacement)    Chronic respiratory failure ([place]) 12/15/2018   Depression 12/15/2018   CAD (coronary artery disease) 12/15/2018   Chronic diastolic CHF (congestive heart failure) ([place]) 12/15/2018   History of pulmonary embolism 08/25/2018   Severe aortic stenosis    Aortic atherosclerosis ([place]) 05/20/2018   Insomnia 04/08/2016   COPD (chronic obstructive pulmonary disease) ([place]) 05/07/2015   Labial cyst 05/07/2015   Exertional dyspnea 02/26/2015   Vitamin D deficiency 01/14/2009   Hyperlipidemia 01/14/2009   DEGENERATIVE JOINT DISEASE 06/12/2008   Fibromyalgia 12/15/2007   Generalized anxiety disorder 12/14/2007    Past Surgical History:  Procedure Laterality Date   ABDOMINAL HYSTERECTOMY     ANTERIOR CERVICAL DISCECTOMY     APPENDECTOMY     CARPAL TUNNEL RELEASE  12/2011   right arm   CATARACT EXTRACTION     CORONARY ATHERECTOMY  07/23/2018   PTCA/orbital  atherectomy/DES x 1 proximal to mid LAD   CORONARY ATHERECTOMY N/A 07/23/2018   Procedure: CORONARY ATHERECTOMY;  Surgeon: Burnell Blanks, MD;  Location: [facility];  Service: Cardiovascular;  Laterality: N/A;   CORONARY STENT INTERVENTION     CORONARY STENT INTERVENTION N/A 07/23/2018   Procedure: CORONARY STENT INTERVENTION;  Surgeon: Burnell Blanks, MD;  Location: [facility];  Service: Cardiovascular;  Laterality: N/A;   EYE SURGERY     LUMBAR LAMINECTOMY     right knee arthroscopy     right shoulder replacement  04/2009   right shoulder surgery  2008   Dr. Noemi Chapel   RIGHT/LEFT HEART  CATH AND CORONARY ANGIOGRAPHY N/A 07/07/2018   Procedure: RIGHT/LEFT HEART CATH AND CORONARY ANGIOGRAPHY;  Surgeon: Burnell Blanks, MD;  Location: [facility];  Service: Cardiovascular;  Laterality: N/A;   TEE WITHOUT CARDIOVERSION N/A 03/29/2019   Procedure: TRANSESOPHAGEAL ECHOCARDIOGRAM (TEE);  Surgeon: Burnell Blanks, MD;  Location: [facility];  Service: Open Heart Surgery;  Laterality: N/A;   TRANSCATHETER AORTIC VALVE REPLACEMENT, TRANSFEMORAL N/A 03/29/2019   Procedure: TRANSCATHETER AORTIC VALVE REPLACEMENT, TRANSFEMORAL;  Surgeon: Burnell Blanks, MD;  Location: [facility];  Service: Open Heart Surgery;  Laterality: N/A;   ULTRASOUND GUIDANCE FOR VASCULAR ACCESS  07/07/2018   Procedure: Ultrasound Guidance For Vascular Access;  Surgeon: Burnell Blanks, MD;  Location: [facility];  Service: Cardiovascular;;     OB History   No obstetric history on file.      Home Medications    Prior to Admission medications   Medication Sig Start Date End Date Taking? Authorizing Provider  acetaminophen (TYLENOL) 500 MG tablet Take 1,000 mg by mouth every 6 (six) hours as needed for headache (pain).   Yes [provider]  albuterol (PROVENTIL) (2.5 MG/3ML) 0.083% nebulizer solution Take 3 mLs (2.5 mg total) by nebulization every 4 (four) hours as needed for wheezing or shortness of breath. DX: COPD J44.9 04/12/18  Yes Kasa, Maretta Bees, MD  albuterol (VENTOLIN HFA) 108 (90 Base) MCG/ACT inhaler Inhale 2 puffs into the lungs every 6 (six) hours as needed for wheezing. 08/29/19  Yes Kasa, Maretta Bees, MD  ALPRAZolam Duanne Moron) 0.5 MG tablet Take 1 tablet (0.5 mg total) by mouth 2 (two) times daily as needed for anxiety. Patient taking differently: Take 0.5 mg by mouth at bedtime.  06/27/19  Yes [name], [name], MD  arformoterol (BROVANA) 15 MCG/2ML NEBU Take 2 mLs (15 mcg total) by nebulization 2 (two) times daily. DX: COPD J44.9 07/27/17  Yes Flora Lipps, MD  aspirin EC  81 MG tablet Take 1 tablet (81 mg total) by mouth daily. Patient taking differently: Take 81 mg by mouth at bedtime.  04/01/19  Yes [name] R, PA-C  budesonide (PULMICORT) 0.5 MG/2ML nebulizer solution Take 2 mLs (0.5 mg total) by nebulization 2 (two) times daily. DX: COPD J44.9 07/09/17  Yes Kasa, Maretta Bees, MD  escitalopram (LEXAPRO) 20 MG tablet Take 1 tablet (20 mg total) by mouth daily. Patient taking differently: Take 20 mg by mouth every morning.  06/30/18  Yes [name], [name], MD  fluticasone [facility]) 50 MCG/ACT nasal spray Place 2 sprays into both nostrils daily. Patient taking differently: Place 2 sprays into both nostrils daily as needed for allergies or rhinitis.  12/18/18  Yes Ghimire, Henreitta Leber, MD  simvastatin (ZOCOR) 20 MG tablet Take 1 tablet (20 mg total) by mouth at bedtime. 12/23/17  Yes [name], MD  triamcinolone cream (KENALOG) 0.1 % Apply 1 application topically 2 (two) times daily as needed (rash). 08/10/19  Yes [name], [name], MD  AMBULATORY NON FORMULARY MEDICATION Medication Name: Incentive spirometer Use 10-15 times per day 12/14/18   Flora Lipps, MD  ferrous sulfate 325 (65 FE) MG tablet Take 1 tablet (325 mg total) by mouth daily. Patient not taking: Reported on 09/17/2019 04/01/19 03/31/20  Eileen Stanford, PA-C  furosemide (LASIX) 20 MG tablet Take 1 tablet (20 mg total) by mouth daily. Patient not taking: Reported on 09/17/2019 04/01/19 03/31/20  Eileen Stanford, PA-C  guaiFENesin (MUCINEX) 600 MG 12 hr tablet Take 1 tablet (600 mg total) by mouth 2 (two) times daily. Patient not taking: Reported on 09/17/2019 12/18/18   Jonetta Osgood, MD  loratadine (CLARITIN) 10 MG tablet Take 1 tablet (10 mg total) by mouth daily. Patient not taking: Reported on 09/17/2019 12/18/18   Jonetta Osgood, MD  pantoprazole (PROTONIX) 40 MG tablet Take 1 tablet (40 mg total) by mouth daily at 12 noon. Patient not taking: Reported on 09/17/2019 12/18/18   Jonetta Osgood, MD  Respiratory Therapy Supplies (FLUTTER) DEVI 1 each by Does not apply route daily. 12/14/18   Flora Lipps, MD    Family History Family History  Problem Relation Age of Onset   Other Mother        broken hip   Other Father        kidney problems   Colon cancer Neg Hx    Stomach cancer Neg Hx    Rectal cancer Neg Hx    Pancreatic cancer Neg Hx     Social History Social History   Tobacco Use   Smoking status: Former Smoker    Packs/day: 2.00    Years: 30.00    Pack years: 60.00    Types: Cigarettes    Quit date: 10/28/1991    Years since quitting: 27.9   Smokeless tobacco: Never Used  Substance Use Topics   Alcohol use: Yes    Comment: 1 drink per night   Drug use: No     Allergies   Contrast media [iodinated diagnostic agents] and Iohexol   Review of Systems Review of Systems  Constitutional: Negative for chills and fever.  Eyes: Negative for visual disturbance.  Respiratory: Negative for cough and shortness of breath.   Cardiovascular: Negative for chest pain and leg swelling.  Gastrointestinal: Negative for abdominal pain.  Musculoskeletal: Positive for arthralgias. Negative for joint swelling.  Skin: Negative for color change and rash.  Neurological: Negative for weakness, numbness and headaches.  All other systems reviewed and are negative.    Physical Exam Updated Vital Signs BP 139/85 (BP Location: Left Arm)    Pulse 87    Temp 98.6 F (37 C) (Oral)    Resp 14    SpO2 96%   Physical Exam Vitals signs and nursing note reviewed.  Constitutional:      General: She is not in acute distress.    Appearance: Normal appearance. She is well-developed and normal weight. She is not ill-appearing or diaphoretic.     Comments: Elderly female in no acute distress  HENT:     Head: Normocephalic and atraumatic.     Comments: Scalp without hematoma or deformity Eyes:     General:        Right eye: No discharge.        Left eye: No  discharge.     Extraocular Movements: Extraocular  movements intact.     Pupils: Pupils are equal, round, and reactive to light.  Neck:     Musculoskeletal: Neck supple.     Comments: C-spine nontender to palpation Cardiovascular:     Rate and Rhythm: Normal rate and regular rhythm.     Heart sounds: Normal heart sounds.  Pulmonary:     Effort: Pulmonary effort is normal. No respiratory distress.     Breath sounds: Normal breath sounds. No wheezing or rales.     Comments: On home 2 L O2 respirations equal and unlabored, patient able to speak in full sentences, lungs clear to auscultation bilaterally Abdominal:     General: Bowel sounds are normal. There is no distension.     Palpations: Abdomen is soft. There is no mass.     Tenderness: There is no abdominal tenderness. There is no guarding.     Comments: Abdomen soft, nondistended, nontender to palpation in all quadrants without guarding or peritoneal signs  Musculoskeletal:        General: Tenderness present. No deformity.     Comments: There is some tenderness over the right hip without palpable deformity there is no swelling, erythema or warmth, no ecchymosis.  At rest minimal pain but with any movement patient cries out in pain.  There is no internal rotation or shortening of the right leg.  Distal pulses are intact. There is also some tenderness over the right shoulder without any bony deformity, no ecchymosis.  Patient with full range of motion.  Distal pulses intact. No midline spinal tenderness. All other joints are supple and easily movable, all compartments are soft  Skin:    General: Skin is warm and dry.     Capillary Refill: Capillary refill takes less than 2 seconds.  Neurological:     Mental Status: She is alert.     Coordination: Coordination normal.     Comments: Speech is clear, very hard of hearing but able to follow commands CN III-XII intact Normal strength in upper and lower extremities bilaterally including  dorsiflexion and plantar flexion, strong and equal grip strength Sensation normal to light and sharp touch Moves extremities without ataxia, coordination intact  Psychiatric:        Mood and Affect: Mood normal.        Behavior: Behavior normal.      ED Treatments / Results  Labs (all labs ordered are listed, but only abnormal results are displayed) Labs Reviewed  BASIC METABOLIC PANEL - Abnormal; Notable for the following components:      Result Value   Glucose, Bld 109 (*)    All other components within normal limits  CBC WITH DIFFERENTIAL/PLATELET - Abnormal; Notable for the following components:   MCV 103.0 (*)    Lymphs Abs 0.6 (*)    All other components within normal limits  SARS CORONAVIRUS 2 (TAT 6-24 HRS)  PROTIME-INR  TYPE AND SCREEN    EKG None  Radiology Ct Head Wo Contrast  Result Date: 09/17/2019 CLINICAL DATA:  Minor head trauma. High clinical risk. EXAM: CT HEAD WITHOUT CONTRAST CT CERVICAL SPINE WITHOUT CONTRAST TECHNIQUE: Multidetector CT imaging of the head and cervical spine was performed following the standard protocol without intravenous contrast. Multiplanar CT image reconstructions of the cervical spine were also generated. COMPARISON:  09/09/2007 MRI brain. FINDINGS: CT HEAD FINDINGS Brain: Tiny chronic appearing right basal ganglia lacune. No evidence of parenchymal hemorrhage or extra-axial fluid collection. No mass lesion, mass effect, or midline shift. No CT evidence  of acute infarction. Generalized cerebral volume loss. Nonspecific mild subcortical and periventricular white matter hypodensity, most in keeping with chronic small vessel ischemic change. No ventriculomegaly. Vascular: No acute abnormality. Skull: No evidence of calvarial fracture. Sinuses/Orbits: The visualized paranasal sinuses are essentially clear. Other:  The mastoid air cells are unopacified. CT CERVICAL SPINE FINDINGS Alignment: Normal cervical lordosis. No facet subluxation. Dens  is well positioned between the lateral masses of C1. Skull base and vertebrae: No acute fracture. No primary bone lesion or focal pathologic process. Healed deformity in lateral right clavicle, partially visualized. Soft tissues and spinal canal: No prevertebral edema. No visible canal hematoma. Disc levels: Status post ACDF C3-4 with no evidence of hardware fracture or loosening. Ankylosis of the C3-4 disc. Marked degenerative disc disease at C4-5, C5-6 and C6-7. Mild-to-moderate bilateral cervical facet arthropathy. Mild degenerative foraminal stenosis on the left at C3-4. Moderate degenerative foraminal stenosis bilaterally at C4-5. Mild degenerative foraminal stenosis bilaterally at C5-6 and C6-7. Upper chest: No acute abnormality. Other: Visualized mastoid air cells appear clear. No discrete thyroid nodules. No pathologically enlarged cervical nodes. IMPRESSION: 1. No evidence of acute intracranial abnormality. No evidence of calvarial fracture. 2. Generalized cerebral volume loss and mild chronic small vessel ischemic changes in the cerebral white matter. Tiny chronic appearing right basal ganglia lacune. 3. No cervical spine fracture or subluxation. 4. Status post C3-4 ACDF with no evidence of hardware complication. 5. Marked degenerative changes in the cervical spine as detailed. Electronically Signed   By: Ilona Sorrel M.D.   On: 09/17/2019 16:20   Ct Cervical Spine Wo Contrast  Result Date: 09/17/2019 CLINICAL DATA:  Minor head trauma. High clinical risk. EXAM: CT HEAD WITHOUT CONTRAST CT CERVICAL SPINE WITHOUT CONTRAST TECHNIQUE: Multidetector CT imaging of the head and cervical spine was performed following the standard protocol without intravenous contrast. Multiplanar CT image reconstructions of the cervical spine were also generated. COMPARISON:  09/09/2007 MRI brain. FINDINGS: CT HEAD FINDINGS Brain: Tiny chronic appearing right basal ganglia lacune. No evidence of parenchymal hemorrhage or  extra-axial fluid collection. No mass lesion, mass effect, or midline shift. No CT evidence of acute infarction. Generalized cerebral volume loss. Nonspecific mild subcortical and periventricular white matter hypodensity, most in keeping with chronic small vessel ischemic change. No ventriculomegaly. Vascular: No acute abnormality. Skull: No evidence of calvarial fracture. Sinuses/Orbits: The visualized paranasal sinuses are essentially clear. Other:  The mastoid air cells are unopacified. CT CERVICAL SPINE FINDINGS Alignment: Normal cervical lordosis. No facet subluxation. Dens is well positioned between the lateral masses of C1. Skull base and vertebrae: No acute fracture. No primary bone lesion or focal pathologic process. Healed deformity in lateral right clavicle, partially visualized. Soft tissues and spinal canal: No prevertebral edema. No visible canal hematoma. Disc levels: Status post ACDF C3-4 with no evidence of hardware fracture or loosening. Ankylosis of the C3-4 disc. Marked degenerative disc disease at C4-5, C5-6 and C6-7. Mild-to-moderate bilateral cervical facet arthropathy. Mild degenerative foraminal stenosis on the left at C3-4. Moderate degenerative foraminal stenosis bilaterally at C4-5. Mild degenerative foraminal stenosis bilaterally at C5-6 and C6-7. Upper chest: No acute abnormality. Other: Visualized mastoid air cells appear clear. No discrete thyroid nodules. No pathologically enlarged cervical nodes. IMPRESSION: 1. No evidence of acute intracranial abnormality. No evidence of calvarial fracture. 2. Generalized cerebral volume loss and mild chronic small vessel ischemic changes in the cerebral white matter. Tiny chronic appearing right basal ganglia lacune. 3. No cervical spine fracture or subluxation. 4. Status post C3-4  ACDF with no evidence of hardware complication. 5. Marked degenerative changes in the cervical spine as detailed. Electronically Signed   By: Ilona Sorrel M.D.   On:  09/17/2019 16:20   Ct Hip Right Wo Contrast  Result Date: 09/17/2019 CLINICAL DATA:  Right hip pain after fall 1 week ago EXAM: CT OF THE RIGHT HIP WITHOUT CONTRAST TECHNIQUE: Multidetector CT imaging of the right hip was performed according to the standard protocol. Multiplanar CT image reconstructions were also generated. COMPARISON:  CT 08/24/2018 FINDINGS: Bones/Joint/Cartilage The right hip is intact without fracture or dislocation. Moderate osteoarthritis of the right hip joint with joint space narrowing, subchondral cystic changes, and marginal osteophyte formation. There is mineralization within the area of the anterosuperior labrum. Enthesopathic changes are present at the greater trochanter and ischial tuberosity. The right SI joint and pubic symphysis are intact with mild-to-moderate degenerative changes. There is chondrocalcinosis at the symphysis pubis. There is suggestion of a small hip joint effusion with a few small mineralized loose bodies. Ligaments Suboptimally assessed by CT. Muscles and Tendons Fatty infiltration of the gluteus minimus muscle likely related to chronic tendon tear. No evidence of an acute muscular or tendinous injury. Soft tissues No soft tissue hematoma or fluid collection. IMPRESSION: 1. No acute fracture or dislocation of the right hip. 2. Moderate osteoarthritis of the right hip joint with small joint effusion and a few small mineralized loose bodies. Electronically Signed   By: Davina Poke M.D.   On: 09/17/2019 16:22    Procedures Procedures (including critical care time)  Medications Ordered in ED Medications  fentaNYL (SUBLIMAZE) injection 50 mcg (50 mcg Intravenous Given 09/17/19 1823)  predniSONE (DELTASONE) tablet 60 mg (60 mg Oral Given 09/17/19 1921)  HYDROcodone-acetaminophen (NORCO/VICODIN) 5-325 MG per tablet 1 tablet (1 tablet Oral Given 09/17/19 1921)     Initial Impression / Assessment and Plan / ED Course  I have reviewed the triage  vital signs and the nursing notes.  Pertinent labs & imaging results that were available during my care of the patient were reviewed by me and considered in my medical decision making (see chart for details).  85 year old female sent from orthopedic office with concern for right hip fracture, patient had a fall 2 days ago and has been having increasing pain in the right hip over the last few days with increasing difficulty walking, no new falls or injury.  When patient fell she did hit her head and has also been complaining of some pain in the right shoulder that radiates down to the right hand.  She has no numbness weakness or tingling in her lower extremities.  There is no current evidence of head trauma on exam.  At rest patient reports only minimal pain but with any movement of the right hip she has severe pain.  Prior to this injury patient was walking independently with a cane.  Patient was seen in orthopedic office and had negative x-rays but they have high suspicion for right hip fracture, shoulder x-rays were normal.  Will get basic lab work and EKG for potential preop, as well as CTs of the right hip, head and cervical spine, question of right shoulder pain could potentially be radicular.  Patient was seen at [facility] today, will consult Dr. Percell Miller after CT has resulted.  Lab work unremarkable, no leukocytosis, stable hemoglobin, no significant electrolyte derangements.  CT of the head shows no evidence of acute intracranial abnormalities no evidence of cervical spine fracture or subluxation, and no  evidence of hardware complication from previous C3-4 ACDF.  There are marked degenerative changes within the cervical spine.  CT scan of the right hip without evidence of acute fracture or dislocation, there is moderate osteoarthritis of the right hip with a small joint effusion and a few small mineralized loose bodies.  I discussed these results with Dr. Percell Miller with orthopedics who felt that MRI  could be done but this could potentially be done outpatient if patient's pain is controlled and she is ambulatory, he would recommend discharge with Medrol Dosepak and close follow-up in the office.  I gave patient pain medication and attempted to ambulate her but she was not able to get out of bed due to pain when moving the right hip.  Discussed this with Dr. Percell Miller will plan for inpatient admission for pain control and MRI and Dr. Percell Miller will see the patient in the morning, he request medicine admission.  Case discussed with Dr. Myna Hidalgo with [facility] hospitalist who will see and admit the patient.  Final Clinical Impressions(s) / ED Diagnoses   Final diagnoses:  Fall, initial encounter  Right hip pain  Unable to ambulate    ED Discharge Orders    None       Janet Berlin 09/17/19 2204    Milton Ferguson, MD 09/19/19 [phone]

## 2019-09-17 NOTE — ED Triage Notes (Signed)
Pt. Sent here from Dr. Raliegh Ip with CD , fracture of rt. Hip.

## 2019-09-17 NOTE — Progress Notes (Signed)
New Admission Note:  Arrival Method: Via stretcher from ED to [facility]. Mental Orientation: Alert & Oriented x3 (HOH) Telemetry: None ordered Assessment: Completed Skin: Refer to flowsheet IV: Left FA & Right AC Pain: 0/10 Safety Measures: Safety Fall Prevention Plan discussed with patient. Admission: Completed 5C Orientation: Patient has been orientated to the room, unit and the staff.  Orders have been reviewed and are being implemented. Will continue to monitor the patient. Call light has been placed within reach and bed alarm has been activated.   Vassie Moselle, RN  Phone Number: [phone]

## 2019-09-17 NOTE — ED Notes (Signed)
[name] setzer-daughter- [phone]

## 2019-09-17 NOTE — ED Notes (Signed)
ED TO INPATIENT HANDOFF REPORT  ED Nurse Name and Phone #: Kyree Adriano   S Name/Age/Gender Jessica [name] 85 y.o. female Room/Bed: 042C/042C  Code Status   Code Status: Full Code  Home/SNF/Other Home Patient oriented to: self, place, time and situation Is this baseline? Yes   Triage Complete: Triage complete  Chief Complaint fall with hip fx  Triage Note Pt. Sent here from Dr. Raliegh Ip with CD , fracture of rt. Hip.    Allergies Allergies  Allergen Reactions  . Contrast Media [Iodinated Diagnostic Agents] Itching, Rash and Other (See Comments)    Hypotension, Skin turns red like a sunburn  . Iohexol Itching, Rash and Other (See Comments)    Hypotension, Skin turns red like a sunburn    Level of Care/Admitting Diagnosis ED Disposition    ED Disposition Condition [facility] Area: [place] [[id number]]  Level of Care: Med-Surg [16]  I expect the patient will be discharged within 24 hours: Yes  LOW acuity---Tx typically complete <24 hrs---ACUTE conditions typically can be evaluated <24 hours---LABS likely to return to acceptable levels <24 hours---IS near functional baseline---EXPECTED to return to current living arrangement---NOT newly hypoxic: Does not meet criteria for 5C-Observation unit  Covid Evaluation: Asymptomatic Screening Protocol (No Symptoms)  Diagnosis: Acute right hip pain [id number]  Admitting Physician: Vianne Bulls [id number]  Attending Physician: Vianne Bulls [id number]  PT Class (Do Not Modify): Observation [104]  PT Acc Code (Do Not Modify): Observation [10022]       B Medical/Surgery History Past Medical History:  Diagnosis Date  . Anxiety   . congenital nystagmus   . COPD (chronic obstructive pulmonary disease) ([place])   . Coronary artery disease   . DJD (degenerative joint disease)   . Fibromyalgia   . Low back pain syndrome   . Memory loss   . Other and unspecified hyperlipidemia   . Pancreatic  lesion   . Pneumonia   . S/P TAVR (transcatheter aortic valve replacement)    26 mm Edwards Sapien 3 via the TF approach  . Severe aortic stenosis   . Venous insufficiency    Past Surgical History:  Procedure Laterality Date  . ABDOMINAL HYSTERECTOMY    . ANTERIOR CERVICAL DISCECTOMY    . APPENDECTOMY    . CARPAL TUNNEL RELEASE  12/2011   right arm  . CATARACT EXTRACTION    . CORONARY ATHERECTOMY  07/23/2018   PTCA/orbital atherectomy/DES x 1 proximal to mid LAD  . CORONARY ATHERECTOMY N/A 07/23/2018   Procedure: CORONARY ATHERECTOMY;  Surgeon: Burnell Blanks, MD;  Location: [facility];  Service: Cardiovascular;  Laterality: N/A;  . CORONARY STENT INTERVENTION    . CORONARY STENT INTERVENTION N/A 07/23/2018   Procedure: CORONARY STENT INTERVENTION;  Surgeon: Burnell Blanks, MD;  Location: [facility];  Service: Cardiovascular;  Laterality: N/A;  . EYE SURGERY    . LUMBAR LAMINECTOMY    . right knee arthroscopy    . right shoulder replacement  04/2009  . right shoulder surgery  2008   Dr. Noemi Chapel  . RIGHT/LEFT HEART CATH AND CORONARY ANGIOGRAPHY N/A 07/07/2018   Procedure: RIGHT/LEFT HEART CATH AND CORONARY ANGIOGRAPHY;  Surgeon: Burnell Blanks, MD;  Location: [facility];  Service: Cardiovascular;  Laterality: N/A;  . TEE WITHOUT CARDIOVERSION N/A 03/29/2019   Procedure: TRANSESOPHAGEAL ECHOCARDIOGRAM (TEE);  Surgeon: Burnell Blanks, MD;  Location: [facility];  Service: Open Heart Surgery;  Laterality:  N/A;  . TRANSCATHETER AORTIC VALVE REPLACEMENT, TRANSFEMORAL N/A 03/29/2019   Procedure: TRANSCATHETER AORTIC VALVE REPLACEMENT, TRANSFEMORAL;  Surgeon: Burnell Blanks, MD;  Location: [facility];  Service: Open Heart Surgery;  Laterality: N/A;  . ULTRASOUND GUIDANCE FOR VASCULAR ACCESS  07/07/2018   Procedure: Ultrasound Guidance For Vascular Access;  Surgeon: Burnell Blanks, MD;  Location: [facility];  Service:  Cardiovascular;;     A IV Location/Drains/Wounds Patient Lines/Drains/Airways Status   Active Line/Drains/Airways    Name:   Placement date:   Placement time:   Site:   Days:   Peripheral IV 09/17/19 Left Forearm   09/17/19    1617    Forearm   less than 1   External Urinary Catheter   04/01/19    0000    -   169   Incision (Closed) 03/29/19 Groin Right   03/29/19    1342     172   Incision (Closed) 03/29/19 Groin Left   03/29/19    1342     172          Intake/Output Last 24 hours No intake or output data in the 24 hours ending 09/17/19 2205  Labs/Imaging Results for orders placed or performed during the hospital encounter of 09/17/19 (from the past 48 hour(s))  Type and screen [place]     Status: None   Collection Time: 09/17/19  3:35 PM  Result Value Ref Range   ABO/RH(D) A POS    Antibody Screen NEG    Sample Expiration      09/20/2019,2359 Performed at [facility], [place] [address], [place], [place] [id number]   Basic metabolic panel     Status: Abnormal   Collection Time: 09/17/19  3:47 PM  Result Value Ref Range   Sodium 139 135 - 145 mmol/L   Potassium 3.6 3.5 - 5.1 mmol/L   Chloride 98 98 - 111 mmol/L   CO2 31 22 - 32 mmol/L   Glucose, Bld 109 (H) 70 - 99 mg/dL   BUN 12 8 - 23 mg/dL   Creatinine, Ser 0.83 0.44 - 1.00 mg/dL   Calcium 9.3 8.9 - 10.3 mg/dL   GFR calc non Af Amer >60 >60 mL/min   GFR calc Af Amer >60 >60 mL/min   Anion gap 10 5 - 15    Comment: Performed at [facility], [place] [address], [place], [place] [address]  CBC WITH DIFFERENTIAL     Status: Abnormal   Collection Time: 09/17/19  3:47 PM  Result Value Ref Range   WBC 8.2 4.0 - 10.5 K/uL   RBC 3.98 3.87 - 5.11 MIL/uL   Hemoglobin 13.0 12.0 - 15.0 g/dL   HCT 41.0 36.0 - 46.0 %   MCV 103.0 (H) 80.0 - 100.0 fL   MCH 32.7 26.0 - 34.0 pg   MCHC 31.7 30.0 - 36.0 g/dL   RDW 14.4 11.5 - 15.5 %   Platelets 198 150 - 400 K/uL   nRBC 0.0 0.0 - 0.2 %   Neutrophils  Relative % 87 %   Neutro Abs 7.2 1.7 - 7.7 K/uL   Lymphocytes Relative 7 %   Lymphs Abs 0.6 (L) 0.7 - 4.0 K/uL   Monocytes Relative 6 %   Monocytes Absolute 0.5 0.1 - 1.0 K/uL   Eosinophils Relative 0 %   Eosinophils Absolute 0.0 0.0 - 0.5 K/uL   Basophils Relative 0 %   Basophils Absolute 0.0  0.0 - 0.1 K/uL   Immature Granulocytes 0 %   Abs Immature Granulocytes 0.02 0.00 - 0.07 K/uL    Comment: Performed at [facility], [place] [address], [place], [place] [address]  Protime-INR     Status: None   Collection Time: 09/17/19  3:47 PM  Result Value Ref Range   Prothrombin Time 13.0 11.4 - 15.2 seconds   INR 1.0 0.8 - 1.2    Comment: (NOTE) INR goal varies based on device and disease states. Performed at [facility], [place] [address] [place], [place] [address]    Ct Head Wo Contrast  Result Date: 09/17/2019 CLINICAL DATA:  Minor head trauma. High clinical risk. EXAM: CT HEAD WITHOUT CONTRAST CT CERVICAL SPINE WITHOUT CONTRAST TECHNIQUE: Multidetector CT imaging of the head and cervical spine was performed following the standard protocol without intravenous contrast. Multiplanar CT image reconstructions of the cervical spine were also generated. COMPARISON:  09/09/2007 MRI brain. FINDINGS: CT HEAD FINDINGS Brain: Tiny chronic appearing right basal ganglia lacune. No evidence of parenchymal hemorrhage or extra-axial fluid collection. No mass lesion, mass effect, or midline shift. No CT evidence of acute infarction. Generalized cerebral volume loss. Nonspecific mild subcortical and periventricular white matter hypodensity, most in keeping with chronic small vessel ischemic change. No ventriculomegaly. Vascular: No acute abnormality. Skull: No evidence of calvarial fracture. Sinuses/Orbits: The visualized paranasal sinuses are essentially clear. Other:  The mastoid air cells are unopacified. CT CERVICAL SPINE FINDINGS Alignment: Normal cervical lordosis. No facet subluxation. Dens is  well positioned between the lateral masses of C1. Skull base and vertebrae: No acute fracture. No primary bone lesion or focal pathologic process. Healed deformity in lateral right clavicle, partially visualized. Soft tissues and spinal canal: No prevertebral edema. No visible canal hematoma. Disc levels: Status post ACDF C3-4 with no evidence of hardware fracture or loosening. Ankylosis of the C3-4 disc. Marked degenerative disc disease at C4-5, C5-6 and C6-7. Mild-to-moderate bilateral cervical facet arthropathy. Mild degenerative foraminal stenosis on the left at C3-4. Moderate degenerative foraminal stenosis bilaterally at C4-5. Mild degenerative foraminal stenosis bilaterally at C5-6 and C6-7. Upper chest: No acute abnormality. Other: Visualized mastoid air cells appear clear. No discrete thyroid nodules. No pathologically enlarged cervical nodes. IMPRESSION: 1. No evidence of acute intracranial abnormality. No evidence of calvarial fracture. 2. Generalized cerebral volume loss and mild chronic small vessel ischemic changes in the cerebral white matter. Tiny chronic appearing right basal ganglia lacune. 3. No cervical spine fracture or subluxation. 4. Status post C3-4 ACDF with no evidence of hardware complication. 5. Marked degenerative changes in the cervical spine as detailed. Electronically Signed   By: Ilona Sorrel M.D.   On: 09/17/2019 16:20   Ct Cervical Spine Wo Contrast  Result Date: 09/17/2019 CLINICAL DATA:  Minor head trauma. High clinical risk. EXAM: CT HEAD WITHOUT CONTRAST CT CERVICAL SPINE WITHOUT CONTRAST TECHNIQUE: Multidetector CT imaging of the head and cervical spine was performed following the standard protocol without intravenous contrast. Multiplanar CT image reconstructions of the cervical spine were also generated. COMPARISON:  09/09/2007 MRI brain. FINDINGS: CT HEAD FINDINGS Brain: Tiny chronic appearing right basal ganglia lacune. No evidence of parenchymal hemorrhage or  extra-axial fluid collection. No mass lesion, mass effect, or midline shift. No CT evidence of acute infarction. Generalized cerebral volume loss. Nonspecific mild subcortical and periventricular white matter hypodensity, most in keeping with chronic small vessel ischemic change. No ventriculomegaly. Vascular: No acute abnormality. Skull: No evidence of calvarial fracture. Sinuses/Orbits:  The visualized paranasal sinuses are essentially clear. Other:  The mastoid air cells are unopacified. CT CERVICAL SPINE FINDINGS Alignment: Normal cervical lordosis. No facet subluxation. Dens is well positioned between the lateral masses of C1. Skull base and vertebrae: No acute fracture. No primary bone lesion or focal pathologic process. Healed deformity in lateral right clavicle, partially visualized. Soft tissues and spinal canal: No prevertebral edema. No visible canal hematoma. Disc levels: Status post ACDF C3-4 with no evidence of hardware fracture or loosening. Ankylosis of the C3-4 disc. Marked degenerative disc disease at C4-5, C5-6 and C6-7. Mild-to-moderate bilateral cervical facet arthropathy. Mild degenerative foraminal stenosis on the left at C3-4. Moderate degenerative foraminal stenosis bilaterally at C4-5. Mild degenerative foraminal stenosis bilaterally at C5-6 and C6-7. Upper chest: No acute abnormality. Other: Visualized mastoid air cells appear clear. No discrete thyroid nodules. No pathologically enlarged cervical nodes. IMPRESSION: 1. No evidence of acute intracranial abnormality. No evidence of calvarial fracture. 2. Generalized cerebral volume loss and mild chronic small vessel ischemic changes in the cerebral white matter. Tiny chronic appearing right basal ganglia lacune. 3. No cervical spine fracture or subluxation. 4. Status post C3-4 ACDF with no evidence of hardware complication. 5. Marked degenerative changes in the cervical spine as detailed. Electronically Signed   By: Ilona Sorrel M.D.   On:  09/17/2019 16:20   Ct Hip Right Wo Contrast  Result Date: 09/17/2019 CLINICAL DATA:  Right hip pain after fall 1 week ago EXAM: CT OF THE RIGHT HIP WITHOUT CONTRAST TECHNIQUE: Multidetector CT imaging of the right hip was performed according to the standard protocol. Multiplanar CT image reconstructions were also generated. COMPARISON:  CT 08/24/2018 FINDINGS: Bones/Joint/Cartilage The right hip is intact without fracture or dislocation. Moderate osteoarthritis of the right hip joint with joint space narrowing, subchondral cystic changes, and marginal osteophyte formation. There is mineralization within the area of the anterosuperior labrum. Enthesopathic changes are present at the greater trochanter and ischial tuberosity. The right SI joint and pubic symphysis are intact with mild-to-moderate degenerative changes. There is chondrocalcinosis at the symphysis pubis. There is suggestion of a small hip joint effusion with a few small mineralized loose bodies. Ligaments Suboptimally assessed by CT. Muscles and Tendons Fatty infiltration of the gluteus minimus muscle likely related to chronic tendon tear. No evidence of an acute muscular or tendinous injury. Soft tissues No soft tissue hematoma or fluid collection. IMPRESSION: 1. No acute fracture or dislocation of the right hip. 2. Moderate osteoarthritis of the right hip joint with small joint effusion and a few small mineralized loose bodies. Electronically Signed   By: Davina Poke M.D.   On: 09/17/2019 16:22    Pending Labs Unresulted Labs (From admission, onward)    Start     Ordered   09/18/19 0500  CBC WITH DIFFERENTIAL  Tomorrow morning,   R     09/17/19 2039   09/18/19 [id number]  Basic metabolic panel  Tomorrow morning,   R     09/17/19 2039   09/17/19 1859  SARS CORONAVIRUS 2 (TAT 6-24 HRS) Nasopharyngeal Nasopharyngeal Swab  (Asymptomatic/Tier 3)  Once,   STAT    Question Answer Comment  Is this test for diagnosis or screening Screening    Symptomatic for COVID-19 as defined by CDC No   Hospitalized for COVID-19 No   Admitted to ICU for COVID-19 No   Previously tested for COVID-19 Yes   Resident in a congregate (group) care setting No   Employed in healthcare setting No  Pregnant No      09/17/19 1858          Vitals/Pain Today's Vitals   09/17/19 2045 09/17/19 2115 09/17/19 2130 09/17/19 2145  BP: 124/65 (!) 133/59 133/71 138/61  Pulse: 72 66 66 66  Resp: 16 (!) 21 13 13   Temp:      TempSrc:      SpO2: 95% 99% 100% 99%  PainSc:        Isolation Precautions No active isolations  Medications Medications  simvastatin (ZOCOR) tablet 20 mg (has no administration in time range)  ALPRAZolam (XANAX) tablet 0.5 mg (has no administration in time range)  escitalopram (LEXAPRO) tablet 20 mg (has no administration in time range)  albuterol (VENTOLIN HFA) 108 (90 Base) MCG/ACT inhaler 2 puff (has no administration in time range)  arformoterol (BROVANA) nebulizer solution 15 mcg (has no administration in time range)  HYDROcodone-acetaminophen (NORCO/VICODIN) 5-325 MG per tablet 0.5 tablet (has no administration in time range)  morphine 2 MG/ML injection 1.5 mg (has no administration in time range)  polyethylene glycol (MIRALAX / GLYCOLAX) packet 17 g (has no administration in time range)  fentaNYL (SUBLIMAZE) injection 50 mcg (50 mcg Intravenous Given 09/17/19 1823)  predniSONE (DELTASONE) tablet 60 mg (60 mg Oral Given 09/17/19 1921)  HYDROcodone-acetaminophen (NORCO/VICODIN) 5-325 MG per tablet 1 tablet (1 tablet Oral Given 09/17/19 1921)    Mobility walks with device High fall risk   Focused Assessments    R Recommendations: See Admitting Provider Note  Report given to:   Additional Notes:

## 2019-09-18 ENCOUNTER — Observation Stay (HOSPITAL_COMMUNITY): Payer: Medicare Other

## 2019-09-18 DIAGNOSIS — M25551 Pain in right hip: Secondary | ICD-10-CM

## 2019-09-18 DIAGNOSIS — Z5189 Encounter for other specified aftercare: Secondary | ICD-10-CM | POA: Diagnosis not present

## 2019-09-18 DIAGNOSIS — E785 Hyperlipidemia, unspecified: Secondary | ICD-10-CM | POA: Diagnosis present

## 2019-09-18 DIAGNOSIS — I5032 Chronic diastolic (congestive) heart failure: Secondary | ICD-10-CM | POA: Diagnosis not present

## 2019-09-18 DIAGNOSIS — R0602 Shortness of breath: Secondary | ICD-10-CM | POA: Diagnosis not present

## 2019-09-18 DIAGNOSIS — H9190 Unspecified hearing loss, unspecified ear: Secondary | ICD-10-CM | POA: Diagnosis not present

## 2019-09-18 DIAGNOSIS — E559 Vitamin D deficiency, unspecified: Secondary | ICD-10-CM | POA: Diagnosis not present

## 2019-09-18 DIAGNOSIS — M84351A Stress fracture, right femur, initial encounter for fracture: Secondary | ICD-10-CM | POA: Diagnosis not present

## 2019-09-18 DIAGNOSIS — D62 Acute posthemorrhagic anemia: Secondary | ICD-10-CM | POA: Diagnosis not present

## 2019-09-18 DIAGNOSIS — Z91041 Radiographic dye allergy status: Secondary | ICD-10-CM | POA: Diagnosis not present

## 2019-09-18 DIAGNOSIS — Z7901 Long term (current) use of anticoagulants: Secondary | ICD-10-CM | POA: Diagnosis not present

## 2019-09-18 DIAGNOSIS — Z955 Presence of coronary angioplasty implant and graft: Secondary | ICD-10-CM | POA: Diagnosis not present

## 2019-09-18 DIAGNOSIS — I700 Atherosclerosis of aorta: Secondary | ICD-10-CM | POA: Diagnosis present

## 2019-09-18 DIAGNOSIS — W06XXXA Fall from bed, initial encounter: Secondary | ICD-10-CM | POA: Diagnosis present

## 2019-09-18 DIAGNOSIS — G252 Other specified forms of tremor: Secondary | ICD-10-CM | POA: Diagnosis not present

## 2019-09-18 DIAGNOSIS — I2510 Atherosclerotic heart disease of native coronary artery without angina pectoris: Secondary | ICD-10-CM | POA: Diagnosis not present

## 2019-09-18 DIAGNOSIS — S72144A Nondisplaced intertrochanteric fracture of right femur, initial encounter for closed fracture: Secondary | ICD-10-CM | POA: Diagnosis not present

## 2019-09-18 DIAGNOSIS — Z79899 Other long term (current) drug therapy: Secondary | ICD-10-CM | POA: Diagnosis not present

## 2019-09-18 DIAGNOSIS — L219 Seborrheic dermatitis, unspecified: Secondary | ICD-10-CM | POA: Diagnosis not present

## 2019-09-18 DIAGNOSIS — Z96611 Presence of right artificial shoulder joint: Secondary | ICD-10-CM | POA: Diagnosis present

## 2019-09-18 DIAGNOSIS — Z7951 Long term (current) use of inhaled steroids: Secondary | ICD-10-CM | POA: Diagnosis not present

## 2019-09-18 DIAGNOSIS — J449 Chronic obstructive pulmonary disease, unspecified: Secondary | ICD-10-CM | POA: Diagnosis not present

## 2019-09-18 DIAGNOSIS — Z952 Presence of prosthetic heart valve: Secondary | ICD-10-CM | POA: Diagnosis not present

## 2019-09-18 DIAGNOSIS — D539 Nutritional anemia, unspecified: Secondary | ICD-10-CM | POA: Diagnosis not present

## 2019-09-18 DIAGNOSIS — Z20828 Contact with and (suspected) exposure to other viral communicable diseases: Secondary | ICD-10-CM | POA: Diagnosis not present

## 2019-09-18 DIAGNOSIS — M7061 Trochanteric bursitis, right hip: Secondary | ICD-10-CM | POA: Diagnosis not present

## 2019-09-18 DIAGNOSIS — R21 Rash and other nonspecific skin eruption: Secondary | ICD-10-CM | POA: Diagnosis not present

## 2019-09-18 DIAGNOSIS — Z87891 Personal history of nicotine dependence: Secondary | ICD-10-CM | POA: Diagnosis not present

## 2019-09-18 DIAGNOSIS — Z7982 Long term (current) use of aspirin: Secondary | ICD-10-CM | POA: Diagnosis not present

## 2019-09-18 DIAGNOSIS — S72001A Fracture of unspecified part of neck of right femur, initial encounter for closed fracture: Secondary | ICD-10-CM | POA: Diagnosis not present

## 2019-09-18 DIAGNOSIS — I350 Nonrheumatic aortic (valve) stenosis: Secondary | ICD-10-CM | POA: Diagnosis not present

## 2019-09-18 DIAGNOSIS — J9610 Chronic respiratory failure, unspecified whether with hypoxia or hypercapnia: Secondary | ICD-10-CM | POA: Diagnosis not present

## 2019-09-18 DIAGNOSIS — J9611 Chronic respiratory failure with hypoxia: Secondary | ICD-10-CM | POA: Diagnosis not present

## 2019-09-18 DIAGNOSIS — K5900 Constipation, unspecified: Secondary | ICD-10-CM | POA: Diagnosis not present

## 2019-09-18 DIAGNOSIS — G4700 Insomnia, unspecified: Secondary | ICD-10-CM | POA: Diagnosis not present

## 2019-09-18 DIAGNOSIS — M84451A Pathological fracture, right femur, initial encounter for fracture: Secondary | ICD-10-CM | POA: Diagnosis not present

## 2019-09-18 DIAGNOSIS — L89899 Pressure ulcer of other site, unspecified stage: Secondary | ICD-10-CM | POA: Diagnosis not present

## 2019-09-18 DIAGNOSIS — Z86711 Personal history of pulmonary embolism: Secondary | ICD-10-CM | POA: Diagnosis not present

## 2019-09-18 DIAGNOSIS — Z4789 Encounter for other orthopedic aftercare: Secondary | ICD-10-CM | POA: Diagnosis not present

## 2019-09-18 DIAGNOSIS — F329 Major depressive disorder, single episode, unspecified: Secondary | ICD-10-CM | POA: Diagnosis not present

## 2019-09-18 DIAGNOSIS — Y92003 Bedroom of unspecified non-institutional (private) residence as the place of occurrence of the external cause: Secondary | ICD-10-CM | POA: Diagnosis not present

## 2019-09-18 DIAGNOSIS — I110 Hypertensive heart disease with heart failure: Secondary | ICD-10-CM | POA: Diagnosis not present

## 2019-09-18 DIAGNOSIS — F411 Generalized anxiety disorder: Secondary | ICD-10-CM | POA: Diagnosis present

## 2019-09-18 DIAGNOSIS — H5501 Congenital nystagmus: Secondary | ICD-10-CM | POA: Diagnosis not present

## 2019-09-18 DIAGNOSIS — N179 Acute kidney failure, unspecified: Secondary | ICD-10-CM | POA: Diagnosis not present

## 2019-09-18 DIAGNOSIS — S72141D Displaced intertrochanteric fracture of right femur, subsequent encounter for closed fracture with routine healing: Secondary | ICD-10-CM | POA: Diagnosis not present

## 2019-09-18 DIAGNOSIS — M797 Fibromyalgia: Secondary | ICD-10-CM | POA: Diagnosis not present

## 2019-09-18 DIAGNOSIS — M1611 Unilateral primary osteoarthritis, right hip: Secondary | ICD-10-CM | POA: Diagnosis not present

## 2019-09-18 DIAGNOSIS — F419 Anxiety disorder, unspecified: Secondary | ICD-10-CM | POA: Diagnosis not present

## 2019-09-18 LAB — CBC WITH DIFFERENTIAL/PLATELET
Abs Immature Granulocytes: 0.01 10*3/uL (ref 0.00–0.07)
Basophils Absolute: 0 10*3/uL (ref 0.0–0.1)
Basophils Relative: 0 %
Eosinophils Absolute: 0 10*3/uL (ref 0.0–0.5)
Eosinophils Relative: 0 %
HCT: 40.5 % (ref 36.0–46.0)
Hemoglobin: 12.8 g/dL (ref 12.0–15.0)
Immature Granulocytes: 0 %
Lymphocytes Relative: 8 %
Lymphs Abs: 0.4 10*3/uL — ABNORMAL LOW (ref 0.7–4.0)
MCH: 32.2 pg (ref 26.0–34.0)
MCHC: 31.6 g/dL (ref 30.0–36.0)
MCV: 102 fL — ABNORMAL HIGH (ref 80.0–100.0)
Monocytes Absolute: 0 10*3/uL — ABNORMAL LOW (ref 0.1–1.0)
Monocytes Relative: 1 %
Neutro Abs: 4.2 10*3/uL (ref 1.7–7.7)
Neutrophils Relative %: 91 %
Platelets: 210 10*3/uL (ref 150–400)
RBC: 3.97 MIL/uL (ref 3.87–5.11)
RDW: 14.2 % (ref 11.5–15.5)
WBC: 4.7 10*3/uL (ref 4.0–10.5)
nRBC: 0 % (ref 0.0–0.2)

## 2019-09-18 LAB — BASIC METABOLIC PANEL
Anion gap: 10 (ref 5–15)
BUN: 15 mg/dL (ref 8–23)
CO2: 33 mmol/L — ABNORMAL HIGH (ref 22–32)
Calcium: 9.4 mg/dL (ref 8.9–10.3)
Chloride: 98 mmol/L (ref 98–111)
Creatinine, Ser: 0.71 mg/dL (ref 0.44–1.00)
GFR calc Af Amer: >60 mL/min (ref >60)
GFR calc non Af Amer: >60 mL/min (ref >60)
Glucose, Bld: 147 mg/dL — ABNORMAL HIGH (ref 70–99)
Potassium: 4.3 mmol/L (ref 3.5–5.1)
Sodium: 141 mmol/L (ref 135–145)

## 2019-09-18 LAB — SARS CORONAVIRUS 2 (TAT 6-24 HRS): SARS Coronavirus 2: NEGATIVE

## 2019-09-18 LAB — SURGICAL PCR SCREEN
MRSA, PCR: NEGATIVE
Staphylococcus aureus: NEGATIVE

## 2019-09-18 MED ORDER — TRANEXAMIC ACID-NACL 1000-0.7 MG/100ML-% IV SOLN
1000.0000 mg | INTRAVENOUS | Status: AC
  Administered 2019-09-19: 1000 mg via INTRAVENOUS
  Filled 2019-09-18: qty 100

## 2019-09-18 MED ORDER — ACETAMINOPHEN 500 MG PO TABS
1000.0000 mg | ORAL_TABLET | Freq: Once | ORAL | Status: AC
  Administered 2019-09-19: 1000 mg via ORAL
  Filled 2019-09-18: qty 2

## 2019-09-18 MED ORDER — CHLORHEXIDINE GLUCONATE 4 % EX LIQD
60.0000 mL | Freq: Once | CUTANEOUS | Status: AC
  Administered 2019-09-19: 4 via TOPICAL
  Filled 2019-09-18 (×2): qty 60

## 2019-09-18 MED ORDER — CEFAZOLIN SODIUM-DEXTROSE 2-4 GM/100ML-% IV SOLN
2.0000 g | INTRAVENOUS | Status: AC
  Administered 2019-09-19: 2 g via INTRAVENOUS
  Filled 2019-09-18 (×2): qty 100

## 2019-09-18 MED ORDER — LIDOCAINE 5 % EX PTCH
1.0000 | MEDICATED_PATCH | CUTANEOUS | Status: DC
  Administered 2019-09-18 – 2019-09-26 (×9): 1 via TRANSDERMAL
  Filled 2019-09-18 (×9): qty 1

## 2019-09-18 MED ORDER — ENSURE PRE-SURGERY PO LIQD
296.0000 mL | Freq: Once | ORAL | Status: AC
  Administered 2019-09-18: 296 mL via ORAL
  Filled 2019-09-18: qty 296

## 2019-09-18 MED ORDER — POVIDONE-IODINE 10 % EX SWAB
2.0000 "application " | Freq: Once | CUTANEOUS | Status: AC
  Administered 2019-09-19: 2 via TOPICAL

## 2019-09-18 NOTE — H&P (View-Only) (Signed)
° ° ° °ORTHOPAEDIC CONSULTATION ° °REQUESTING PHYSICIAN: Vann, Jessica U, DO ° °Chief Complaint: Right hip pain ° °HPI: °Jessica [name] is a 85 y.o. female who complains of right hip pain and decreasing ability to bear weight.  He fell about a week ago at home.  Pain is mainly in her groin with movement and rotation of her hip.  Today her hip feels a little better.  She presented to the emergency department where noncontrasted CT of her right hip was negative for fracture or dislocation.  Orthopedics was consulted for evaluation.  MRI of her right hip also negative for fracture but does show marrow edema within the intertrochanteric region proximal right femur suggestive of a developing insufficiency fracture. ° ° ° °Past Medical History:  °Diagnosis Date  °• Anxiety   °• congenital nystagmus   °• COPD (chronic obstructive pulmonary disease) (HCC)   °• Coronary artery disease   °• DJD (degenerative joint disease)   °• Fibromyalgia   °• Low back pain syndrome   °• Memory loss   °• Other and unspecified hyperlipidemia   °• Pancreatic lesion   °• Pneumonia   °• S/P TAVR (transcatheter aortic valve replacement)   ° 26 mm Edwards Sapien 3 via the TF approach  °• Severe aortic stenosis   °• Venous insufficiency   ° °Past Surgical History:  °Procedure Laterality Date  °• ABDOMINAL HYSTERECTOMY    °• ANTERIOR CERVICAL DISCECTOMY    °• APPENDECTOMY    °• CARPAL TUNNEL RELEASE  12/2011  ° right arm  °• CATARACT EXTRACTION    °• CORONARY ATHERECTOMY  07/23/2018  ° PTCA/orbital atherectomy/DES x 1 proximal to mid LAD  °• CORONARY ATHERECTOMY N/A 07/23/2018  ° Procedure: CORONARY ATHERECTOMY;  Surgeon: McAlhany, Christopher D, MD;  Location: [facility];  Service: Cardiovascular;  Laterality: N/A;  °• CORONARY STENT INTERVENTION    °• CORONARY STENT INTERVENTION N/A 07/23/2018  ° Procedure: CORONARY STENT INTERVENTION;  Surgeon: McAlhany, Christopher D, MD;  Location: [facility];  Service: Cardiovascular;   Laterality: N/A;  °• EYE SURGERY    °• LUMBAR LAMINECTOMY    °• right knee arthroscopy    °• right shoulder replacement  04/2009  °• right shoulder surgery  2008  ° Dr. Wainer  °• RIGHT/LEFT HEART CATH AND CORONARY ANGIOGRAPHY N/A 07/07/2018  ° Procedure: RIGHT/LEFT HEART CATH AND CORONARY ANGIOGRAPHY;  Surgeon: McAlhany, Christopher D, MD;  Location: [facility];  Service: Cardiovascular;  Laterality: N/A;  °• TEE WITHOUT CARDIOVERSION N/A 03/29/2019  ° Procedure: TRANSESOPHAGEAL ECHOCARDIOGRAM (TEE);  Surgeon: McAlhany, Christopher D, MD;  Location: [facility];  Service: Open Heart Surgery;  Laterality: N/A;  °• TRANSCATHETER AORTIC VALVE REPLACEMENT, TRANSFEMORAL N/A 03/29/2019  ° Procedure: TRANSCATHETER AORTIC VALVE REPLACEMENT, TRANSFEMORAL;  Surgeon: McAlhany, Christopher D, MD;  Location: [facility];  Service: Open Heart Surgery;  Laterality: N/A;  °• ULTRASOUND GUIDANCE FOR VASCULAR ACCESS  07/07/2018  ° Procedure: Ultrasound Guidance For Vascular Access;  Surgeon: McAlhany, Christopher D, MD;  Location: [facility];  Service: Cardiovascular;;  ° °Social History  ° °Socioeconomic History  °• Marital status: Widowed  °  Spouse name: Not on file  °• Number of children: 2  °• Years of education: Not on file  °• Highest education level: Not on file  °Occupational History  °• Occupation: Still works,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  °Social Needs  °• Financial resource strain: Not on file  °• Food insecurity  °  Worry: Not   on file  °  Inability: Not on file  °• Transportation needs  °  Medical: Not on file  °  Non-medical: Not on file  °Tobacco Use  °• Smoking status: Former Smoker  °  Packs/day: 2.00  °  Years: 30.00  °  Pack years: 60.00  °  Types: Cigarettes  °  Quit date: 10/28/1991  °  Years since quitting: 27.9  °• Smokeless tobacco: Never Used  °Substance and Sexual Activity  °• Alcohol use: Yes  °  Comment: 1 drink per night  °• Drug use: No  °• Sexual activity: Not Currently  °Lifestyle  °• Physical activity  °  Days per  week: Not on file  °  Minutes per session: Not on file  °• Stress: Not on file  °Relationships  °• Social connections  °  Talks on phone: Not on file  °  Gets together: Not on file  °  Attends religious service: Not on file  °  Active member of club or organization: Not on file  °  Attends meetings of clubs or organizations: Not on file  °  Relationship status: Not on file  °Other Topics Concern  °• Not on file  °Social History Narrative  ° 1 sibling alive age 90  ° 1 sibling alive age 88  ° 1 sibling deceased age 86  ° 1 sibling alive age 87  ° °Family History  °Problem Relation Age of Onset  °• Other Mother   °     broken hip  °• Other Father   °     kidney problems  °• Colon cancer Neg Hx   °• Stomach cancer Neg Hx   °• Rectal cancer Neg Hx   °• Pancreatic cancer Neg Hx   ° °Allergies  °Allergen Reactions  °• Contrast Media [Iodinated Diagnostic Agents] Itching, Rash and Other (See Comments)  °  Hypotension, Skin turns red like a sunburn  °• Iohexol Itching, Rash and Other (See Comments)  °  Hypotension, Skin turns red like a sunburn  ° °Prior to Admission medications   °Medication Sig Start Date End Date Taking? Authorizing Provider  °acetaminophen (TYLENOL) 500 MG tablet Take 1,000 mg by mouth every 6 (six) hours as needed for headache (pain).   Yes [provider]  °albuterol (PROVENTIL) (2.5 MG/3ML) 0.083% nebulizer solution Take 3 mLs (2.5 mg total) by nebulization every 4 (four) hours as needed for wheezing or shortness of breath. DX: COPD J44.9 04/12/18  Yes Kasa, Kurian, MD  °albuterol (VENTOLIN HFA) 108 (90 Base) MCG/ACT inhaler Inhale 2 puffs into the lungs every 6 (six) hours as needed for wheezing. 08/29/19  Yes Kasa, Kurian, MD  °ALPRAZolam (XANAX) 0.5 MG tablet Take 1 tablet (0.5 mg total) by mouth 2 (two) times daily as needed for anxiety. °Patient taking differently: Take 0.5 mg by mouth at bedtime.  06/27/19  Yes Jordan, [name], MD  °arformoterol (BROVANA) 15 MCG/2ML NEBU Take 2 mLs (15  mcg total) by nebulization 2 (two) times daily. DX: COPD J44.9 07/27/17  Yes Kasa, Kurian, MD  °aspirin EC 81 MG tablet Take 1 tablet (81 mg total) by mouth daily. °Patient taking differently: Take 81 mg by mouth at bedtime.  04/01/19  Yes Thompson, Kathryn R, PA-C  °budesonide (PULMICORT) 0.5 MG/2ML nebulizer solution Take 2 mLs (0.5 mg total) by nebulization 2 (two) times daily. DX: COPD J44.9 07/09/17  Yes Kasa, Kurian, MD  °escitalopram (LEXAPRO) 20 MG tablet Take 1 tablet (  20 mg total) by mouth daily. °Patient taking differently: Take 20 mg by mouth every morning.  06/30/18  Yes Jordan, [name], MD  °fluticasone (FLONASE) 50 MCG/ACT nasal spray Place 2 sprays into both nostrils daily. °Patient taking differently: Place 2 sprays into both nostrils daily as needed for allergies or rhinitis.  12/18/18  Yes Ghimire, Shanker M, MD  °simvastatin (ZOCOR) 20 MG tablet Take 1 tablet (20 mg total) by mouth at bedtime. 12/23/17  Yes Gollan, Timothy J, MD  °triamcinolone cream (KENALOG) 0.1 % Apply 1 application topically 2 (two) times daily as needed (rash). 08/10/19  Yes Jordan, [name], MD  °AMBULATORY NON FORMULARY MEDICATION Medication Name: Incentive spirometer °Use 10-15 times per day 12/14/18   Kasa, Kurian, MD  °ferrous sulfate 325 (65 FE) MG tablet Take 1 tablet (325 mg total) by mouth daily. °Patient not taking: Reported on 09/17/2019 04/01/19 03/31/20  Thompson, Kathryn R, PA-C  °furosemide (LASIX) 20 MG tablet Take 1 tablet (20 mg total) by mouth daily. °Patient not taking: Reported on 09/17/2019 04/01/19 03/31/20  Thompson, Kathryn R, PA-C  °guaiFENesin (MUCINEX) 600 MG 12 hr tablet Take 1 tablet (600 mg total) by mouth 2 (two) times daily. °Patient not taking: Reported on 09/17/2019 12/18/18   Ghimire, Shanker M, MD  °loratadine (CLARITIN) 10 MG tablet Take 1 tablet (10 mg total) by mouth daily. °Patient not taking: Reported on 09/17/2019 12/18/18   Ghimire, Shanker M, MD  °pantoprazole (PROTONIX) 40 MG tablet Take 1 tablet  (40 mg total) by mouth daily at 12 noon. °Patient not taking: Reported on 09/17/2019 12/18/18   Ghimire, Shanker M, MD  °Respiratory Therapy Supplies (FLUTTER) DEVI 1 each by Does not apply route daily. 12/14/18   Kasa, Kurian, MD  ° °Ct Head Wo Contrast ° °Result Date: 09/17/2019 °CLINICAL DATA:  Minor head trauma. High clinical risk. EXAM: CT HEAD WITHOUT CONTRAST CT CERVICAL SPINE WITHOUT CONTRAST TECHNIQUE: Multidetector CT imaging of the head and cervical spine was performed following the standard protocol without intravenous contrast. Multiplanar CT image reconstructions of the cervical spine were also generated. COMPARISON:  09/09/2007 MRI brain. FINDINGS: CT HEAD FINDINGS Brain: Tiny chronic appearing right basal ganglia lacune. No evidence of parenchymal hemorrhage or extra-axial fluid collection. No mass lesion, mass effect, or midline shift. No CT evidence of acute infarction. Generalized cerebral volume loss. Nonspecific mild subcortical and periventricular white matter hypodensity, most in keeping with chronic small vessel ischemic change. No ventriculomegaly. Vascular: No acute abnormality. Skull: No evidence of calvarial fracture. Sinuses/Orbits: The visualized paranasal sinuses are essentially clear. Other:  The mastoid air cells are unopacified. CT CERVICAL SPINE FINDINGS Alignment: Normal cervical lordosis. No facet subluxation. Dens is well positioned between the lateral masses of C1. Skull base and vertebrae: No acute fracture. No primary bone lesion or focal pathologic process. Healed deformity in lateral right clavicle, partially visualized. Soft tissues and spinal canal: No prevertebral edema. No visible canal hematoma. Disc levels: Status post ACDF C3-4 with no evidence of hardware fracture or loosening. Ankylosis of the C3-4 disc. Marked degenerative disc disease at C4-5, C5-6 and C6-7. Mild-to-moderate bilateral cervical facet arthropathy. Mild degenerative foraminal stenosis on the left at  C3-4. Moderate degenerative foraminal stenosis bilaterally at C4-5. Mild degenerative foraminal stenosis bilaterally at C5-6 and C6-7. Upper chest: No acute abnormality. Other: Visualized mastoid air cells appear clear. No discrete thyroid nodules. No pathologically enlarged cervical nodes. IMPRESSION: 1. No evidence of acute intracranial abnormality. No evidence of calvarial fracture. 2. Generalized cerebral   volume loss and mild chronic small vessel ischemic changes in the cerebral white matter. Tiny chronic appearing right basal ganglia lacune. 3. No cervical spine fracture or subluxation. 4. Status post C3-4 ACDF with no evidence of hardware complication. 5. Marked degenerative changes in the cervical spine as detailed. Electronically Signed   By: Jason A Poff M.D.   On: 09/17/2019 16:20  ° °Ct Cervical Spine Wo Contrast ° °Result Date: 09/17/2019 °CLINICAL DATA:  Minor head trauma. High clinical risk. EXAM: CT HEAD WITHOUT CONTRAST CT CERVICAL SPINE WITHOUT CONTRAST TECHNIQUE: Multidetector CT imaging of the head and cervical spine was performed following the standard protocol without intravenous contrast. Multiplanar CT image reconstructions of the cervical spine were also generated. COMPARISON:  09/09/2007 MRI brain. FINDINGS: CT HEAD FINDINGS Brain: Tiny chronic appearing right basal ganglia lacune. No evidence of parenchymal hemorrhage or extra-axial fluid collection. No mass lesion, mass effect, or midline shift. No CT evidence of acute infarction. Generalized cerebral volume loss. Nonspecific mild subcortical and periventricular white matter hypodensity, most in keeping with chronic small vessel ischemic change. No ventriculomegaly. Vascular: No acute abnormality. Skull: No evidence of calvarial fracture. Sinuses/Orbits: The visualized paranasal sinuses are essentially clear. Other:  The mastoid air cells are unopacified. CT CERVICAL SPINE FINDINGS Alignment: Normal cervical lordosis. No facet subluxation.  Dens is well positioned between the lateral masses of C1. Skull base and vertebrae: No acute fracture. No primary bone lesion or focal pathologic process. Healed deformity in lateral right clavicle, partially visualized. Soft tissues and spinal canal: No prevertebral edema. No visible canal hematoma. Disc levels: Status post ACDF C3-4 with no evidence of hardware fracture or loosening. Ankylosis of the C3-4 disc. Marked degenerative disc disease at C4-5, C5-6 and C6-7. Mild-to-moderate bilateral cervical facet arthropathy. Mild degenerative foraminal stenosis on the left at C3-4. Moderate degenerative foraminal stenosis bilaterally at C4-5. Mild degenerative foraminal stenosis bilaterally at C5-6 and C6-7. Upper chest: No acute abnormality. Other: Visualized mastoid air cells appear clear. No discrete thyroid nodules. No pathologically enlarged cervical nodes. IMPRESSION: 1. No evidence of acute intracranial abnormality. No evidence of calvarial fracture. 2. Generalized cerebral volume loss and mild chronic small vessel ischemic changes in the cerebral white matter. Tiny chronic appearing right basal ganglia lacune. 3. No cervical spine fracture or subluxation. 4. Status post C3-4 ACDF with no evidence of hardware complication. 5. Marked degenerative changes in the cervical spine as detailed. Electronically Signed   By: Jason A Poff M.D.   On: 09/17/2019 16:20  ° °Ct Hip Right Wo Contrast ° °Result Date: 09/17/2019 °CLINICAL DATA:  Right hip pain after fall 1 week ago EXAM: CT OF THE RIGHT HIP WITHOUT CONTRAST TECHNIQUE: Multidetector CT imaging of the right hip was performed according to the standard protocol. Multiplanar CT image reconstructions were also generated. COMPARISON:  CT 08/24/2018 FINDINGS: Bones/Joint/Cartilage The right hip is intact without fracture or dislocation. Moderate osteoarthritis of the right hip joint with joint space narrowing, subchondral cystic changes, and marginal osteophyte  formation. There is mineralization within the area of the anterosuperior labrum. Enthesopathic changes are present at the greater trochanter and ischial tuberosity. The right SI joint and pubic symphysis are intact with mild-to-moderate degenerative changes. There is chondrocalcinosis at the symphysis pubis. There is suggestion of a small hip joint effusion with a few small mineralized loose bodies. Ligaments Suboptimally assessed by CT. Muscles and Tendons Fatty infiltration of the gluteus minimus muscle likely related to chronic tendon tear. No evidence of an acute muscular or tendinous injury.   Soft tissues No soft tissue hematoma or fluid collection. IMPRESSION: 1. No acute fracture or dislocation of the right hip. 2. Moderate osteoarthritis of the right hip joint with small joint effusion and a few small mineralized loose bodies. Electronically Signed   By: Nicholas  Plundo M.D.   On: 09/17/2019 16:22  ° °Mr Hip Right Wo Contrast ° °Result Date: 09/18/2019 °CLINICAL DATA:  Severe right hip pain EXAM: MR OF THE RIGHT HIP WITHOUT CONTRAST TECHNIQUE: Multiplanar, multisequence MR imaging was performed. No intravenous contrast was administered. COMPARISON:  CT 09/17/2019 FINDINGS: Bones: Within the intertrochanteric aspect of the proximal right femur is irregular T1 hypointense signal which predominates near the base of the lesser trochanter (series 4, images 23-25). There is mild associated marrow edema within this region. No displaced fracture component. Right hip joint is intact without dislocation. Mild diffuse marrow heterogeneity without discrete marrow replacing lesion. Findings are nonspecific but can be seen in the setting of chronic anemia, smoking, and/or obesity. Articular cartilage and labrum Articular cartilage: Moderate diffuse cartilage thinning and surface irregularity. Labrum:  Diffuse degeneration. Joint or bursal effusion Joint effusion:  Small joint effusion with synovitis. Bursae: Small amount  of iliopsoas bursal fluid. There is small peritrochanteric bursal fluid and edema. Muscles and tendons Muscles and tendons: Tendinosis with low-grade partial tearing of the insertional fibers of the gluteus minimus tendon. Bilateral hamstring tendinosis. Intramuscular edema about the right hip particularly involving the vastus lateralis and vastus intermedius muscles as well as the abductor muscle group. Other findings Miscellaneous:   No soft tissue hematoma. Colonic diverticulosis. IMPRESSION: 1. Irregular area low T1 signal intensity and associated mild marrow edema within the intertrochanteric region of the proximal right femur suggestive of a developing insufficiency fracture. 2. Small joint effusion with synovitis. 3. Moderate osteoarthritis of the right hip joint. 4. Mild iliopsoas and peritrochanteric bursitis. 5. Mild diffuse marrow heterogeneity without discrete marrow replacing lesion. Findings are nonspecific but can be seen in the setting of chronic anemia, smoking, and/or obesity. Electronically Signed   By: Nicholas  Plundo M.D.   On: 09/18/2019 09:17  ° ° °Positive ROS: All other systems have been reviewed and were otherwise negative with the exception of those mentioned in the HPI and as above. ° °Objective: °Labs cbc °Recent Labs  °  09/17/19 °1547 09/18/19 °0507  °WBC 8.2 4.7  °HGB 13.0 12.8  °HCT 41.0 40.5  °PLT 198 210  ° ° °Labs inflam °No results for input(s): CRP in the last 72 hours. ° °Invalid input(s): ESR ° °Labs coag °Recent Labs  °  09/17/19 °1547  °INR 1.0  ° ° °Recent Labs  °  09/17/19 °1547 09/18/19 °0507  °NA 139 141  °K 3.6 4.3  °CL 98 98  °CO2 31 33*  °GLUCOSE 109* 147*  °BUN 12 15  °CREATININE 0.83 0.71  °CALCIUM 9.3 9.4  ° ° °Physical Exam: °Vitals:  ° 09/18/19 0529 09/18/19 0747  °BP: (!) 130/57 (!) 163/63  °Pulse: 62 63  °Resp: 15 16  °Temp: 97.6 °F (36.4 °C) 97.7 °F (36.5 °C)  °SpO2: 96% 95%  ° °General: Alert, no acute distress.  Supine in bed.  Calm, hard of hearing but  conversant. °Mental status: Alert and Oriented x3 °Neurologic: Speech Clear and organized, no gross focal findings or movement disorder appreciated. °Respiratory: No cyanosis, no use of accessory musculature °Cardiovascular: No pedal edema °GI: Abdomen is soft and non-tender, non-distended. °Skin: Warm and dry.  No lesions in the area of chief complaint. °  Extremities: Warm and well perfused w/o edema °Psychiatric: Patient is competent for consent with normal mood and affect ° °MUSCULOSKELETAL:  °R LE: °Mild to moderate discomfort with passive range of motion in the hip mainly with internal rotation.  There is some lack of motion here consistent with arthritis seen on CT scan.  Mild greater trochanteric tenderness laterally.  She can actively flex her hip lifting it off the bed and he does flex and extend her knee.  EHL/FHL, dorsiflexion/plantarflexion intact.  Sensation intact distally. ° °Other extremities are atraumatic with painless ROM and NVI. ° °Assessment / Plan: °Principal Problem: °  Acute right hip pain °Active Problems: °  Generalized anxiety disorder °  COPD (chronic obstructive pulmonary disease) (HCC) °  CAD (coronary artery disease) °  Chronic diastolic CHF (congestive heart failure) (HCC) ° ° °Right hip pain, fall °· No obvious fracture or dislocation on x-ray or CT scan.   °· ?Developing intertrochanteric insufficiency fracture?  Based on MRI endings. °· She has significant hip arthritis which is likely contributing some to her pain. °· Dr. Murphy will review MRI images in more detail today.  He will also discuss findings and options with her daughter. °· No emergent need for surgery, but this is likely contributing to her pain and runs the risk of completing a fracture.  Surgically, we will consider IM nail as soon as tomorrow to prevent completion of possible insufficiency fracture.    ° ° °Contact information:  Timothy Murphy MD, Jamyron Redd Martensen PA-C ° °Jericho Alcorn Calvin Martensen III  PA-C °09/18/2019 °10:58 AM ° °

## 2019-09-18 NOTE — Consult Note (Signed)
ORTHOPAEDIC CONSULTATION  REQUESTING PHYSICIAN: Geradine Girt, DO  Chief Complaint: Right hip pain  HPI: Jessica [name] is a 85 y.o. female who complains of right hip pain and decreasing ability to bear weight.  He fell about a week ago at home.  Pain is mainly in her groin with movement and rotation of her hip.  Today her hip feels a little better.  She presented to the emergency department where noncontrasted CT of her right hip was negative for fracture or dislocation.  Orthopedics was consulted for evaluation.  MRI of her right hip also negative for fracture but does show marrow edema within the intertrochanteric region proximal right femur suggestive of a developing insufficiency fracture.    Past Medical History:  Diagnosis Date   Anxiety    congenital nystagmus    COPD (chronic obstructive pulmonary disease) (HCC)    Coronary artery disease    DJD (degenerative joint disease)    Fibromyalgia    Low back pain syndrome    Memory loss    Other and unspecified hyperlipidemia    Pancreatic lesion    Pneumonia    S/P TAVR (transcatheter aortic valve replacement)    26 mm Edwards Sapien 3 via the TF approach   Severe aortic stenosis    Venous insufficiency    Past Surgical History:  Procedure Laterality Date   ABDOMINAL HYSTERECTOMY     ANTERIOR CERVICAL DISCECTOMY     APPENDECTOMY     CARPAL TUNNEL RELEASE  12/2011   right arm   CATARACT EXTRACTION     CORONARY ATHERECTOMY  07/23/2018   PTCA/orbital atherectomy/DES x 1 proximal to mid LAD   CORONARY ATHERECTOMY N/A 07/23/2018   Procedure: CORONARY ATHERECTOMY;  Surgeon: Burnell Blanks, MD;  Location: [facility];  Service: Cardiovascular;  Laterality: N/A;   CORONARY STENT INTERVENTION     CORONARY STENT INTERVENTION N/A 07/23/2018   Procedure: CORONARY STENT INTERVENTION;  Surgeon: Burnell Blanks, MD;  Location: [facility];  Service: Cardiovascular;   Laterality: N/A;   EYE SURGERY     LUMBAR LAMINECTOMY     right knee arthroscopy     right shoulder replacement  04/2009   right shoulder surgery  2008   Dr. Noemi Chapel   RIGHT/LEFT HEART CATH AND CORONARY ANGIOGRAPHY N/A 07/07/2018   Procedure: RIGHT/LEFT HEART CATH AND CORONARY ANGIOGRAPHY;  Surgeon: Burnell Blanks, MD;  Location: [facility];  Service: Cardiovascular;  Laterality: N/A;   TEE WITHOUT CARDIOVERSION N/A 03/29/2019   Procedure: TRANSESOPHAGEAL ECHOCARDIOGRAM (TEE);  Surgeon: Burnell Blanks, MD;  Location: [facility];  Service: Open Heart Surgery;  Laterality: N/A;   TRANSCATHETER AORTIC VALVE REPLACEMENT, TRANSFEMORAL N/A 03/29/2019   Procedure: TRANSCATHETER AORTIC VALVE REPLACEMENT, TRANSFEMORAL;  Surgeon: Burnell Blanks, MD;  Location: [facility];  Service: Open Heart Surgery;  Laterality: N/A;   ULTRASOUND GUIDANCE FOR VASCULAR ACCESS  07/07/2018   Procedure: Ultrasound Guidance For Vascular Access;  Surgeon: Burnell Blanks, MD;  Location: [facility];  Service: Cardiovascular;;   Social History   Socioeconomic History   Marital status: Widowed    Spouse name: Not on file   Number of children: 2   Years of education: Not on file   Highest education level: Not on file  Occupational History   Occupation: Still works,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  Social Needs   Financial resource strain: Not on file   Food insecurity    Worry: Not  on file    Inability: Not on file   Transportation needs    Medical: Not on file    Non-medical: Not on file  Tobacco Use   Smoking status: Former Smoker    Packs/day: 2.00    Years: 30.00    Pack years: 60.00    Types: Cigarettes    Quit date: 10/28/1991    Years since quitting: 27.9   Smokeless tobacco: Never Used  Substance and Sexual Activity   Alcohol use: Yes    Comment: 1 drink per night   Drug use: No   Sexual activity: Not Currently  Lifestyle   Physical activity    Days per  week: Not on file    Minutes per session: Not on file   Stress: Not on file  Relationships   Social connections    Talks on phone: Not on file    Gets together: Not on file    Attends religious service: Not on file    Active member of club or organization: Not on file    Attends meetings of clubs or organizations: Not on file    Relationship status: Not on file  Other Topics Concern   Not on file  Social History Narrative   1 sibling alive age 60   1 sibling alive age 86   1 sibling deceased age 78   1 sibling alive age 34   Family History  Problem Relation Age of Onset   Other Mother        broken hip   Other Father        kidney problems   Colon cancer Neg Hx    Stomach cancer Neg Hx    Rectal cancer Neg Hx    Pancreatic cancer Neg Hx    Allergies  Allergen Reactions   Contrast Media [Iodinated Diagnostic Agents] Itching, Rash and Other (See Comments)    Hypotension, Skin turns red like a sunburn   Iohexol Itching, Rash and Other (See Comments)    Hypotension, Skin turns red like a sunburn   Prior to Admission medications   Medication Sig Start Date End Date Taking? Authorizing Provider  acetaminophen (TYLENOL) 500 MG tablet Take 1,000 mg by mouth every 6 (six) hours as needed for headache (pain).   Yes [provider]  albuterol (PROVENTIL) (2.5 MG/3ML) 0.083% nebulizer solution Take 3 mLs (2.5 mg total) by nebulization every 4 (four) hours as needed for wheezing or shortness of breath. DX: COPD J44.9 04/12/18  Yes Kasa, Maretta Bees, MD  albuterol (VENTOLIN HFA) 108 (90 Base) MCG/ACT inhaler Inhale 2 puffs into the lungs every 6 (six) hours as needed for wheezing. 08/29/19  Yes Kasa, Maretta Bees, MD  ALPRAZolam Duanne Moron) 0.5 MG tablet Take 1 tablet (0.5 mg total) by mouth 2 (two) times daily as needed for anxiety. Patient taking differently: Take 0.5 mg by mouth at bedtime.  06/27/19  Yes [name], [name], MD  arformoterol (BROVANA) 15 MCG/2ML NEBU Take 2 mLs (15  mcg total) by nebulization 2 (two) times daily. DX: COPD J44.9 07/27/17  Yes Flora Lipps, MD  aspirin EC 81 MG tablet Take 1 tablet (81 mg total) by mouth daily. Patient taking differently: Take 81 mg by mouth at bedtime.  04/01/19  Yes [name] R, PA-C  budesonide (PULMICORT) 0.5 MG/2ML nebulizer solution Take 2 mLs (0.5 mg total) by nebulization 2 (two) times daily. DX: COPD J44.9 07/09/17  Yes Kasa, Maretta Bees, MD  escitalopram (LEXAPRO) 20 MG tablet Take 1 tablet (  20 mg total) by mouth daily. Patient taking differently: Take 20 mg by mouth every morning.  06/30/18  Yes [name], [name], MD  fluticasone [facility]) 50 MCG/ACT nasal spray Place 2 sprays into both nostrils daily. Patient taking differently: Place 2 sprays into both nostrils daily as needed for allergies or rhinitis.  12/18/18  Yes Ghimire, Henreitta Leber, MD  simvastatin (ZOCOR) 20 MG tablet Take 1 tablet (20 mg total) by mouth at bedtime. 12/23/17  Yes [name], MD  triamcinolone cream (KENALOG) 0.1 % Apply 1 application topically 2 (two) times daily as needed (rash). 08/10/19  Yes [name], [name], MD  AMBULATORY NON FORMULARY MEDICATION Medication Name: Incentive spirometer Use 10-15 times per day 12/14/18   Flora Lipps, MD  ferrous sulfate 325 (65 FE) MG tablet Take 1 tablet (325 mg total) by mouth daily. Patient not taking: Reported on 09/17/2019 04/01/19 03/31/20  Eileen Stanford, PA-C  furosemide (LASIX) 20 MG tablet Take 1 tablet (20 mg total) by mouth daily. Patient not taking: Reported on 09/17/2019 04/01/19 03/31/20  Eileen Stanford, PA-C  guaiFENesin (MUCINEX) 600 MG 12 hr tablet Take 1 tablet (600 mg total) by mouth 2 (two) times daily. Patient not taking: Reported on 09/17/2019 12/18/18   Jonetta Osgood, MD  loratadine (CLARITIN) 10 MG tablet Take 1 tablet (10 mg total) by mouth daily. Patient not taking: Reported on 09/17/2019 12/18/18   Jonetta Osgood, MD  pantoprazole (PROTONIX) 40 MG tablet Take 1 tablet  (40 mg total) by mouth daily at 12 noon. Patient not taking: Reported on 09/17/2019 12/18/18   Jonetta Osgood, MD  Respiratory Therapy Supplies (FLUTTER) DEVI 1 each by Does not apply route daily. 12/14/18   Flora Lipps, MD   Ct Head Wo Contrast  Result Date: 09/17/2019 CLINICAL DATA:  Minor head trauma. High clinical risk. EXAM: CT HEAD WITHOUT CONTRAST CT CERVICAL SPINE WITHOUT CONTRAST TECHNIQUE: Multidetector CT imaging of the head and cervical spine was performed following the standard protocol without intravenous contrast. Multiplanar CT image reconstructions of the cervical spine were also generated. COMPARISON:  09/09/2007 MRI brain. FINDINGS: CT HEAD FINDINGS Brain: Tiny chronic appearing right basal ganglia lacune. No evidence of parenchymal hemorrhage or extra-axial fluid collection. No mass lesion, mass effect, or midline shift. No CT evidence of acute infarction. Generalized cerebral volume loss. Nonspecific mild subcortical and periventricular white matter hypodensity, most in keeping with chronic small vessel ischemic change. No ventriculomegaly. Vascular: No acute abnormality. Skull: No evidence of calvarial fracture. Sinuses/Orbits: The visualized paranasal sinuses are essentially clear. Other:  The mastoid air cells are unopacified. CT CERVICAL SPINE FINDINGS Alignment: Normal cervical lordosis. No facet subluxation. Dens is well positioned between the lateral masses of C1. Skull base and vertebrae: No acute fracture. No primary bone lesion or focal pathologic process. Healed deformity in lateral right clavicle, partially visualized. Soft tissues and spinal canal: No prevertebral edema. No visible canal hematoma. Disc levels: Status post ACDF C3-4 with no evidence of hardware fracture or loosening. Ankylosis of the C3-4 disc. Marked degenerative disc disease at C4-5, C5-6 and C6-7. Mild-to-moderate bilateral cervical facet arthropathy. Mild degenerative foraminal stenosis on the left at  C3-4. Moderate degenerative foraminal stenosis bilaterally at C4-5. Mild degenerative foraminal stenosis bilaterally at C5-6 and C6-7. Upper chest: No acute abnormality. Other: Visualized mastoid air cells appear clear. No discrete thyroid nodules. No pathologically enlarged cervical nodes. IMPRESSION: 1. No evidence of acute intracranial abnormality. No evidence of calvarial fracture. 2. Generalized cerebral  volume loss and mild chronic small vessel ischemic changes in the cerebral white matter. Tiny chronic appearing right basal ganglia lacune. 3. No cervical spine fracture or subluxation. 4. Status post C3-4 ACDF with no evidence of hardware complication. 5. Marked degenerative changes in the cervical spine as detailed. Electronically Signed   By: Ilona Sorrel M.D.   On: 09/17/2019 16:20   Ct Cervical Spine Wo Contrast  Result Date: 09/17/2019 CLINICAL DATA:  Minor head trauma. High clinical risk. EXAM: CT HEAD WITHOUT CONTRAST CT CERVICAL SPINE WITHOUT CONTRAST TECHNIQUE: Multidetector CT imaging of the head and cervical spine was performed following the standard protocol without intravenous contrast. Multiplanar CT image reconstructions of the cervical spine were also generated. COMPARISON:  09/09/2007 MRI brain. FINDINGS: CT HEAD FINDINGS Brain: Tiny chronic appearing right basal ganglia lacune. No evidence of parenchymal hemorrhage or extra-axial fluid collection. No mass lesion, mass effect, or midline shift. No CT evidence of acute infarction. Generalized cerebral volume loss. Nonspecific mild subcortical and periventricular white matter hypodensity, most in keeping with chronic small vessel ischemic change. No ventriculomegaly. Vascular: No acute abnormality. Skull: No evidence of calvarial fracture. Sinuses/Orbits: The visualized paranasal sinuses are essentially clear. Other:  The mastoid air cells are unopacified. CT CERVICAL SPINE FINDINGS Alignment: Normal cervical lordosis. No facet subluxation.  Dens is well positioned between the lateral masses of C1. Skull base and vertebrae: No acute fracture. No primary bone lesion or focal pathologic process. Healed deformity in lateral right clavicle, partially visualized. Soft tissues and spinal canal: No prevertebral edema. No visible canal hematoma. Disc levels: Status post ACDF C3-4 with no evidence of hardware fracture or loosening. Ankylosis of the C3-4 disc. Marked degenerative disc disease at C4-5, C5-6 and C6-7. Mild-to-moderate bilateral cervical facet arthropathy. Mild degenerative foraminal stenosis on the left at C3-4. Moderate degenerative foraminal stenosis bilaterally at C4-5. Mild degenerative foraminal stenosis bilaterally at C5-6 and C6-7. Upper chest: No acute abnormality. Other: Visualized mastoid air cells appear clear. No discrete thyroid nodules. No pathologically enlarged cervical nodes. IMPRESSION: 1. No evidence of acute intracranial abnormality. No evidence of calvarial fracture. 2. Generalized cerebral volume loss and mild chronic small vessel ischemic changes in the cerebral white matter. Tiny chronic appearing right basal ganglia lacune. 3. No cervical spine fracture or subluxation. 4. Status post C3-4 ACDF with no evidence of hardware complication. 5. Marked degenerative changes in the cervical spine as detailed. Electronically Signed   By: Ilona Sorrel M.D.   On: 09/17/2019 16:20   Ct Hip Right Wo Contrast  Result Date: 09/17/2019 CLINICAL DATA:  Right hip pain after fall 1 week ago EXAM: CT OF THE RIGHT HIP WITHOUT CONTRAST TECHNIQUE: Multidetector CT imaging of the right hip was performed according to the standard protocol. Multiplanar CT image reconstructions were also generated. COMPARISON:  CT 08/24/2018 FINDINGS: Bones/Joint/Cartilage The right hip is intact without fracture or dislocation. Moderate osteoarthritis of the right hip joint with joint space narrowing, subchondral cystic changes, and marginal osteophyte  formation. There is mineralization within the area of the anterosuperior labrum. Enthesopathic changes are present at the greater trochanter and ischial tuberosity. The right SI joint and pubic symphysis are intact with mild-to-moderate degenerative changes. There is chondrocalcinosis at the symphysis pubis. There is suggestion of a small hip joint effusion with a few small mineralized loose bodies. Ligaments Suboptimally assessed by CT. Muscles and Tendons Fatty infiltration of the gluteus minimus muscle likely related to chronic tendon tear. No evidence of an acute muscular or tendinous injury.  Soft tissues No soft tissue hematoma or fluid collection. IMPRESSION: 1. No acute fracture or dislocation of the right hip. 2. Moderate osteoarthritis of the right hip joint with small joint effusion and a few small mineralized loose bodies. Electronically Signed   By: Davina Poke M.D.   On: 09/17/2019 16:22   Mr Hip Right Wo Contrast  Result Date: 09/18/2019 CLINICAL DATA:  Severe right hip pain EXAM: MR OF THE RIGHT HIP WITHOUT CONTRAST TECHNIQUE: Multiplanar, multisequence MR imaging was performed. No intravenous contrast was administered. COMPARISON:  CT 09/17/2019 FINDINGS: Bones: Within the intertrochanteric aspect of the proximal right femur is irregular T1 hypointense signal which predominates near the base of the lesser trochanter (series 4, images 23-25). There is mild associated marrow edema within this region. No displaced fracture component. Right hip joint is intact without dislocation. Mild diffuse marrow heterogeneity without discrete marrow replacing lesion. Findings are nonspecific but can be seen in the setting of chronic anemia, smoking, and/or obesity. Articular cartilage and labrum Articular cartilage: Moderate diffuse cartilage thinning and surface irregularity. Labrum:  Diffuse degeneration. Joint or bursal effusion Joint effusion:  Small joint effusion with synovitis. Bursae: Small amount  of iliopsoas bursal fluid. There is small peritrochanteric bursal fluid and edema. Muscles and tendons Muscles and tendons: Tendinosis with low-grade partial tearing of the insertional fibers of the gluteus minimus tendon. Bilateral hamstring tendinosis. Intramuscular edema about the right hip particularly involving the vastus lateralis and vastus intermedius muscles as well as the abductor muscle group. Other findings Miscellaneous:   No soft tissue hematoma. Colonic diverticulosis. IMPRESSION: 1. Irregular area low T1 signal intensity and associated mild marrow edema within the intertrochanteric region of the proximal right femur suggestive of a developing insufficiency fracture. 2. Small joint effusion with synovitis. 3. Moderate osteoarthritis of the right hip joint. 4. Mild iliopsoas and peritrochanteric bursitis. 5. Mild diffuse marrow heterogeneity without discrete marrow replacing lesion. Findings are nonspecific but can be seen in the setting of chronic anemia, smoking, and/or obesity. Electronically Signed   By: Davina Poke M.D.   On: 09/18/2019 09:17    Positive ROS: All other systems have been reviewed and were otherwise negative with the exception of those mentioned in the HPI and as above.  Objective: Labs cbc Recent Labs    09/17/19 1547 09/18/19 0507  WBC 8.2 4.7  HGB 13.0 12.8  HCT 41.0 40.5  PLT 198 210    Labs inflam No results for input(s): CRP in the last 72 hours.  Invalid input(s): ESR  Labs coag Recent Labs    09/17/19 1547  INR 1.0    Recent Labs    09/17/19 1547 09/18/19 0507  NA 139 141  K 3.6 4.3  CL 98 98  CO2 31 33*  GLUCOSE 109* 147*  BUN 12 15  CREATININE 0.83 0.71  CALCIUM 9.3 9.4    Physical Exam: Vitals:   09/18/19 0529 09/18/19 0747  BP: (!) 130/57 (!) 163/63  Pulse: 62 63  Resp: 15 16  Temp: 97.6 F (36.4 C) 97.7 F (36.5 C)  SpO2: 96% 95%   General: Alert, no acute distress.  Supine in bed.  Calm, hard of hearing but  conversant. Mental status: Alert and Oriented x3 Neurologic: Speech Clear and organized, no gross focal findings or movement disorder appreciated. Respiratory: No cyanosis, no use of accessory musculature Cardiovascular: No pedal edema GI: Abdomen is soft and non-tender, non-distended. Skin: Warm and dry.  No lesions in the area of chief complaint.  Extremities: Warm and well perfused w/o edema Psychiatric: Patient is competent for consent with normal mood and affect  MUSCULOSKELETAL:  R LE: Mild to moderate discomfort with passive range of motion in the hip mainly with internal rotation.  There is some lack of motion here consistent with arthritis seen on CT scan.  Mild greater trochanteric tenderness laterally.  She can actively flex her hip lifting it off the bed and he does flex and extend her knee.  EHL/FHL, dorsiflexion/plantarflexion intact.  Sensation intact distally.  Other extremities are atraumatic with painless ROM and NVI.  Assessment / Plan: Principal Problem:   Acute right hip pain Active Problems:   Generalized anxiety disorder   COPD (chronic obstructive pulmonary disease) (HCC)   CAD (coronary artery disease)   Chronic diastolic CHF (congestive heart failure) (HCC)   Right hip pain, fall  No obvious fracture or dislocation on x-ray or CT scan.    ?Developing intertrochanteric insufficiency fracture?  Based on MRI endings.  She has significant hip arthritis which is likely contributing some to her pain.  Dr. Percell Miller will review MRI images in more detail today.  He will also discuss findings and options with her daughter.  No emergent need for surgery, but this is likely contributing to her pain and runs the risk of completing a fracture.  Surgically, we will consider IM nail as soon as tomorrow to prevent completion of possible insufficiency fracture.      Contact information:  Edmonia Lynch MD, Roxan Hockey PA-C  Prudencio Burly III  PA-C 09/18/2019 10:58 AM

## 2019-09-18 NOTE — Progress Notes (Signed)
Progress Note    Jessica [name]  [id number] DOB: [birth year]  DOA: 09/17/2019 PCP: [name], [name], MD    Brief Narrative:     Medical records reviewed and are as summarized below:  Jessica [name] is an 85 y.o. female with medical history significant for severe aortic stenosis status post valve replacement, COPD with chronic hypoxic respiratory failure, fibromyalgia, generalized anxiety disorder, now presenting to the emergency department for evaluation of severe right hip pain that has kept her from ambulating.  Patient reports that she fell out of her bed 1 to 2 weeks ago, hit the back of her head on a piece of furniture, and had been experiencing some right hip pain since then.  She reports that she had forgotten about the fall until couple days ago when the pain began to get severe and keep her from ambulating.  She reports using a cane at baseline.    Assessment/Plan:   Principal Problem:   Acute right hip pain Active Problems:   Generalized anxiety disorder   COPD (chronic obstructive pulmonary disease) (HCC)   CAD (coronary artery disease)   Chronic diastolic CHF (congestive heart failure) (HCC)   Acute right hip pain  - Patient reports falling out of bed 1-2 wks ago and presents with severe right hip pain that is keeping her from ambulating - CT hip is negative for acute fracture, notable for small joint effusion and mineralized loose bodies  - Orthopedic surgery is consulting and much appreciated- await final recommendations - MRI: 1. Irregular area low T1 signal intensity and associated mild marrow edema within the intertrochanteric region of the proximal right femur suggestive of a developing insufficiency fracture. 2. Small joint effusion with synovitis. 3. Moderate osteoarthritis of the right hip joint. 4. Mild iliopsoas and peritrochanteric bursitis. 5. Mild diffuse marrow heterogeneity without discrete marrow replacing lesion. Findings are nonspecific  but can be seen in the setting of chronic anemia, smoking, and/or obesity. -check vit D levels (does not appear to be on a supplement)  COPD; chronic hypoxic respiratory failure   - Appears stable  - Continue supplemental O2, LABA, as-needed SABA   CAD  - No anginal complaints  - Continue statin, hold ASA pending further hip imaging     Chronic diastolic CHF  - EF was [id number] in July 2020  - Appears compensated, no longer taking Lasix, will SLIV     Anxiety  - Continue low-dose Xanax and Lexapro      Family Communication/Anticipated D/C date and plan/Code Status   DVT prophylaxis: scd Code Status: Full Code.  Family Communication: ortho to call daughter Disposition Plan: pending recommendations of ortho   Medical Consultants:    ortho     Subjective:   hungry  Objective:    Vitals:   09/18/19 0045 09/18/19 0529 09/18/19 0747 09/18/19 1210  BP:  (!) 130/57 (!) 163/63 123/69  Pulse:  62 63 66  Resp:  15 16 16   Temp:  97.6 F (36.4 C) 97.7 F (36.5 C) 97.8 F (36.6 C)  TempSrc:  Oral Oral Oral  SpO2: 98% 96% 95% 94%    Intake/Output Summary (Last 24 hours) at 09/18/2019 1222 Last data filed at 09/18/2019 0902 Gross per 24 hour  Intake 60 ml  Output --  Net 60 ml   There were no vitals filed for this visit.  Exam: In bed, appears stated age NAD Pleasant and cooperative No increased work of breathing +BS, soft  Data Reviewed:  I have personally reviewed following labs and imaging studies:  Labs: Labs show the following:   Basic Metabolic Panel: Recent Labs  Lab 09/17/19 1547 09/18/19 0507  NA 139 141  K 3.6 4.3  CL 98 98  CO2 31 33*  GLUCOSE 109* 147*  BUN 12 15  CREATININE 0.83 0.71  CALCIUM 9.3 9.4   GFR CrCl cannot be calculated (Unknown ideal weight.). Liver Function Tests: No results for input(s): AST, ALT, ALKPHOS, BILITOT, PROT, ALBUMIN in the last 168 hours. No results for input(s): LIPASE, AMYLASE in the last 168  hours. No results for input(s): AMMONIA in the last 168 hours. Coagulation profile Recent Labs  Lab 09/17/19 1547  INR 1.0    CBC: Recent Labs  Lab 09/17/19 1547 09/18/19 0507  WBC 8.2 4.7  NEUTROABS 7.2 4.2  HGB 13.0 12.8  HCT 41.0 40.5  MCV 103.0* 102.0*  PLT 198 210   Cardiac Enzymes: No results for input(s): CKTOTAL, CKMB, CKMBINDEX, TROPONINI in the last 168 hours. BNP (last 3 results) No results for input(s): PROBNP in the last 8760 hours. CBG: No results for input(s): GLUCAP in the last 168 hours. D-Dimer: No results for input(s): DDIMER in the last 72 hours. Hgb A1c: No results for input(s): HGBA1C in the last 72 hours. Lipid Profile: No results for input(s): CHOL, HDL, LDLCALC, TRIG, CHOLHDL, LDLDIRECT in the last 72 hours. Thyroid function studies: No results for input(s): TSH, T4TOTAL, T3FREE, THYROIDAB in the last 72 hours.  Invalid input(s): FREET3 Anemia work up: No results for input(s): VITAMINB12, FOLATE, FERRITIN, TIBC, IRON, RETICCTPCT in the last 72 hours. Sepsis Labs: Recent Labs  Lab 09/17/19 1547 09/18/19 0507  WBC 8.2 4.7    Microbiology Recent Results (from the past 240 hour(s))  SARS CORONAVIRUS 2 (TAT 6-24 HRS) Nasopharyngeal Nasopharyngeal Swab     Status: None   Collection Time: 09/17/19  7:19 PM   Specimen: Nasopharyngeal Swab  Result Value Ref Range Status   SARS Coronavirus 2 NEGATIVE NEGATIVE Final    Comment: (NOTE) SARS-CoV-2 target nucleic acids are NOT DETECTED. The SARS-CoV-2 RNA is generally detectable in upper and lower respiratory specimens during the acute phase of infection. Negative results do not preclude SARS-CoV-2 infection, do not rule out co-infections with other pathogens, and should not be used as the sole basis for treatment or other patient management decisions. Negative results must be combined with clinical observations, patient history, and epidemiological information. The expected result is  Negative. Fact Sheet for Patients: SugarRoll.be Fact Sheet for Healthcare Providers: https://www.woods-mathews.com/ This test is not yet approved or cleared by the [place] FDA and  has been authorized for detection and/or diagnosis of SARS-CoV-2 by FDA under an Emergency Use Authorization (EUA). This EUA will remain  in effect (meaning this test can be used) for the duration of the COVID-19 declaration under Section 56 4(b)(1) of the Act, 21 U.S.C. section 360bbb-3(b)(1), unless the authorization is terminated or revoked sooner. Performed at [facility], [place] [address] [place], [place] [address]     Procedures and diagnostic studies:  Ct Head Wo Contrast  Result Date: 09/17/2019 CLINICAL DATA:  Minor head trauma. High clinical risk. EXAM: CT HEAD WITHOUT CONTRAST CT CERVICAL SPINE WITHOUT CONTRAST TECHNIQUE: Multidetector CT imaging of the head and cervical spine was performed following the standard protocol without intravenous contrast. Multiplanar CT image reconstructions of the cervical spine were also generated. COMPARISON:  09/09/2007 MRI brain. FINDINGS: CT HEAD FINDINGS Brain: Tiny chronic appearing right  basal ganglia lacune. No evidence of parenchymal hemorrhage or extra-axial fluid collection. No mass lesion, mass effect, or midline shift. No CT evidence of acute infarction. Generalized cerebral volume loss. Nonspecific mild subcortical and periventricular white matter hypodensity, most in keeping with chronic small vessel ischemic change. No ventriculomegaly. Vascular: No acute abnormality. Skull: No evidence of calvarial fracture. Sinuses/Orbits: The visualized paranasal sinuses are essentially clear. Other:  The mastoid air cells are unopacified. CT CERVICAL SPINE FINDINGS Alignment: Normal cervical lordosis. No facet subluxation. Dens is well positioned between the lateral masses of C1. Skull base and vertebrae: No acute  fracture. No primary bone lesion or focal pathologic process. Healed deformity in lateral right clavicle, partially visualized. Soft tissues and spinal canal: No prevertebral edema. No visible canal hematoma. Disc levels: Status post ACDF C3-4 with no evidence of hardware fracture or loosening. Ankylosis of the C3-4 disc. Marked degenerative disc disease at C4-5, C5-6 and C6-7. Mild-to-moderate bilateral cervical facet arthropathy. Mild degenerative foraminal stenosis on the left at C3-4. Moderate degenerative foraminal stenosis bilaterally at C4-5. Mild degenerative foraminal stenosis bilaterally at C5-6 and C6-7. Upper chest: No acute abnormality. Other: Visualized mastoid air cells appear clear. No discrete thyroid nodules. No pathologically enlarged cervical nodes. IMPRESSION: 1. No evidence of acute intracranial abnormality. No evidence of calvarial fracture. 2. Generalized cerebral volume loss and mild chronic small vessel ischemic changes in the cerebral white matter. Tiny chronic appearing right basal ganglia lacune. 3. No cervical spine fracture or subluxation. 4. Status post C3-4 ACDF with no evidence of hardware complication. 5. Marked degenerative changes in the cervical spine as detailed. Electronically Signed   By: Ilona Sorrel M.D.   On: 09/17/2019 16:20   Ct Cervical Spine Wo Contrast  Result Date: 09/17/2019 CLINICAL DATA:  Minor head trauma. High clinical risk. EXAM: CT HEAD WITHOUT CONTRAST CT CERVICAL SPINE WITHOUT CONTRAST TECHNIQUE: Multidetector CT imaging of the head and cervical spine was performed following the standard protocol without intravenous contrast. Multiplanar CT image reconstructions of the cervical spine were also generated. COMPARISON:  09/09/2007 MRI brain. FINDINGS: CT HEAD FINDINGS Brain: Tiny chronic appearing right basal ganglia lacune. No evidence of parenchymal hemorrhage or extra-axial fluid collection. No mass lesion, mass effect, or midline shift. No CT evidence  of acute infarction. Generalized cerebral volume loss. Nonspecific mild subcortical and periventricular white matter hypodensity, most in keeping with chronic small vessel ischemic change. No ventriculomegaly. Vascular: No acute abnormality. Skull: No evidence of calvarial fracture. Sinuses/Orbits: The visualized paranasal sinuses are essentially clear. Other:  The mastoid air cells are unopacified. CT CERVICAL SPINE FINDINGS Alignment: Normal cervical lordosis. No facet subluxation. Dens is well positioned between the lateral masses of C1. Skull base and vertebrae: No acute fracture. No primary bone lesion or focal pathologic process. Healed deformity in lateral right clavicle, partially visualized. Soft tissues and spinal canal: No prevertebral edema. No visible canal hematoma. Disc levels: Status post ACDF C3-4 with no evidence of hardware fracture or loosening. Ankylosis of the C3-4 disc. Marked degenerative disc disease at C4-5, C5-6 and C6-7. Mild-to-moderate bilateral cervical facet arthropathy. Mild degenerative foraminal stenosis on the left at C3-4. Moderate degenerative foraminal stenosis bilaterally at C4-5. Mild degenerative foraminal stenosis bilaterally at C5-6 and C6-7. Upper chest: No acute abnormality. Other: Visualized mastoid air cells appear clear. No discrete thyroid nodules. No pathologically enlarged cervical nodes. IMPRESSION: 1. No evidence of acute intracranial abnormality. No evidence of calvarial fracture. 2. Generalized cerebral volume loss and mild chronic small vessel ischemic changes  in the cerebral white matter. Tiny chronic appearing right basal ganglia lacune. 3. No cervical spine fracture or subluxation. 4. Status post C3-4 ACDF with no evidence of hardware complication. 5. Marked degenerative changes in the cervical spine as detailed. Electronically Signed   By: Ilona Sorrel M.D.   On: 09/17/2019 16:20   Ct Hip Right Wo Contrast  Result Date: 09/17/2019 CLINICAL DATA:   Right hip pain after fall 1 week ago EXAM: CT OF THE RIGHT HIP WITHOUT CONTRAST TECHNIQUE: Multidetector CT imaging of the right hip was performed according to the standard protocol. Multiplanar CT image reconstructions were also generated. COMPARISON:  CT 08/24/2018 FINDINGS: Bones/Joint/Cartilage The right hip is intact without fracture or dislocation. Moderate osteoarthritis of the right hip joint with joint space narrowing, subchondral cystic changes, and marginal osteophyte formation. There is mineralization within the area of the anterosuperior labrum. Enthesopathic changes are present at the greater trochanter and ischial tuberosity. The right SI joint and pubic symphysis are intact with mild-to-moderate degenerative changes. There is chondrocalcinosis at the symphysis pubis. There is suggestion of a small hip joint effusion with a few small mineralized loose bodies. Ligaments Suboptimally assessed by CT. Muscles and Tendons Fatty infiltration of the gluteus minimus muscle likely related to chronic tendon tear. No evidence of an acute muscular or tendinous injury. Soft tissues No soft tissue hematoma or fluid collection. IMPRESSION: 1. No acute fracture or dislocation of the right hip. 2. Moderate osteoarthritis of the right hip joint with small joint effusion and a few small mineralized loose bodies. Electronically Signed   By: Davina Poke M.D.   On: 09/17/2019 16:22   Mr Hip Right Wo Contrast  Result Date: 09/18/2019 CLINICAL DATA:  Severe right hip pain EXAM: MR OF THE RIGHT HIP WITHOUT CONTRAST TECHNIQUE: Multiplanar, multisequence MR imaging was performed. No intravenous contrast was administered. COMPARISON:  CT 09/17/2019 FINDINGS: Bones: Within the intertrochanteric aspect of the proximal right femur is irregular T1 hypointense signal which predominates near the base of the lesser trochanter (series 4, images 23-25). There is mild associated marrow edema within this region. No displaced  fracture component. Right hip joint is intact without dislocation. Mild diffuse marrow heterogeneity without discrete marrow replacing lesion. Findings are nonspecific but can be seen in the setting of chronic anemia, smoking, and/or obesity. Articular cartilage and labrum Articular cartilage: Moderate diffuse cartilage thinning and surface irregularity. Labrum:  Diffuse degeneration. Joint or bursal effusion Joint effusion:  Small joint effusion with synovitis. Bursae: Small amount of iliopsoas bursal fluid. There is small peritrochanteric bursal fluid and edema. Muscles and tendons Muscles and tendons: Tendinosis with low-grade partial tearing of the insertional fibers of the gluteus minimus tendon. Bilateral hamstring tendinosis. Intramuscular edema about the right hip particularly involving the vastus lateralis and vastus intermedius muscles as well as the abductor muscle group. Other findings Miscellaneous:   No soft tissue hematoma. Colonic diverticulosis. IMPRESSION: 1. Irregular area low T1 signal intensity and associated mild marrow edema within the intertrochanteric region of the proximal right femur suggestive of a developing insufficiency fracture. 2. Small joint effusion with synovitis. 3. Moderate osteoarthritis of the right hip joint. 4. Mild iliopsoas and peritrochanteric bursitis. 5. Mild diffuse marrow heterogeneity without discrete marrow replacing lesion. Findings are nonspecific but can be seen in the setting of chronic anemia, smoking, and/or obesity. Electronically Signed   By: Davina Poke M.D.   On: 09/18/2019 09:17    Medications:    ALPRAZolam  0.5 mg Oral QHS  arformoterol  15 mcg Nebulization BID   escitalopram  20 mg Oral q morning - 10a   lidocaine  1 patch Transdermal Q24H   simvastatin  20 mg Oral q1800   Continuous Infusions:   LOS: 0 days   Geradine Girt  [facility]   How to contact the [facility] Attending or Consulting provider [place] or covering  provider during after hours [place], for this patient?  1. Check the care team in [facility] and look for a) attending/consulting [facility] provider listed and b) the [facility] team listed 2. Log into [email or web address] and use [place]'s universal password to access. If you do not have the password, please contact the hospital operator. 3. Locate the [facility] provider you are looking for under [facility] and page to a number that you can be directly reached. 4. If you still have difficulty reaching the provider, please page the [facility] (Director on Call) for the Hospitalists listed on amion for assistance.  09/18/2019, 12:22 PM

## 2019-09-19 ENCOUNTER — Inpatient Hospital Stay (HOSPITAL_COMMUNITY): Payer: Medicare Other | Admitting: Certified Registered"

## 2019-09-19 ENCOUNTER — Encounter (HOSPITAL_COMMUNITY)
Admission: EM | Disposition: A | Discharge status: Skilled Nursing Facility | Payer: Self-pay | Source: Home / Self Care | Attending: Student

## 2019-09-19 ENCOUNTER — Encounter (HOSPITAL_COMMUNITY): Payer: Self-pay | Admitting: Certified Registered"

## 2019-09-19 ENCOUNTER — Inpatient Hospital Stay (HOSPITAL_COMMUNITY): Payer: Medicare Other

## 2019-09-19 ENCOUNTER — Other Ambulatory Visit: Payer: Self-pay

## 2019-09-19 DIAGNOSIS — L8990 Pressure ulcer of unspecified site, unspecified stage: Secondary | ICD-10-CM | POA: Insufficient documentation

## 2019-09-19 HISTORY — PX: INTRAMEDULLARY (IM) NAIL INTERTROCHANTERIC: SHX5875

## 2019-09-19 LAB — VITAMIN D 25 HYDROXY (VIT D DEFICIENCY, FRACTURES): Vit D, 25-Hydroxy: 11.22 ng/mL — ABNORMAL LOW (ref 30–100)

## 2019-09-19 SURGERY — FIXATION, FRACTURE, INTERTROCHANTERIC, WITH INTRAMEDULLARY ROD
Anesthesia: General | Laterality: Right

## 2019-09-19 MED ORDER — ROCURONIUM BROMIDE 10 MG/ML (PF) SYRINGE
PREFILLED_SYRINGE | INTRAVENOUS | Status: AC
  Filled 2019-09-19: qty 10

## 2019-09-19 MED ORDER — CEFAZOLIN SODIUM-DEXTROSE 2-4 GM/100ML-% IV SOLN
2.0000 g | Freq: Four times a day (QID) | INTRAVENOUS | Status: AC
  Administered 2019-09-19 – 2019-09-20 (×2): 2 g via INTRAVENOUS
  Filled 2019-09-19 (×2): qty 100

## 2019-09-19 MED ORDER — ONDANSETRON HCL 4 MG PO TABS: 4.0000 mg | ORAL_TABLET | Freq: Four times a day (QID) | ORAL | Status: DC | PRN

## 2019-09-19 MED ORDER — ONDANSETRON HCL 4 MG/2ML IJ SOLN
4.0000 mg | Freq: Four times a day (QID) | INTRAMUSCULAR | Status: DC | PRN
  Administered 2019-09-19 – 2019-09-22 (×2): 4 mg via INTRAVENOUS
  Filled 2019-09-19 (×2): qty 2

## 2019-09-19 MED ORDER — DEXAMETHASONE SODIUM PHOSPHATE 10 MG/ML IJ SOLN
INTRAMUSCULAR | Status: DC | PRN
  Administered 2019-09-19: 10 mg via INTRAVENOUS

## 2019-09-19 MED ORDER — DOCUSATE SODIUM 100 MG PO CAPS
100.0000 mg | ORAL_CAPSULE | Freq: Two times a day (BID) | ORAL | Status: DC
  Administered 2019-09-19 – 2019-09-26 (×14): 100 mg via ORAL
  Filled 2019-09-19 (×14): qty 1

## 2019-09-19 MED ORDER — TRANEXAMIC ACID-NACL 1000-0.7 MG/100ML-% IV SOLN
INTRAVENOUS | Status: AC
  Filled 2019-09-19: qty 100

## 2019-09-19 MED ORDER — ACETAMINOPHEN 500 MG PO TABS
1000.0000 mg | ORAL_TABLET | Freq: Once | ORAL | Status: AC
  Administered 2019-09-19: 1000 mg via ORAL
  Filled 2019-09-19: qty 2

## 2019-09-19 MED ORDER — ROCURONIUM BROMIDE 10 MG/ML (PF) SYRINGE
PREFILLED_SYRINGE | INTRAVENOUS | Status: DC | PRN
  Administered 2019-09-19: 30 mg via INTRAVENOUS

## 2019-09-19 MED ORDER — ONDANSETRON HCL 4 MG/2ML IJ SOLN
INTRAMUSCULAR | Status: DC | PRN
  Administered 2019-09-19: 4 mg via INTRAVENOUS

## 2019-09-19 MED ORDER — FENTANYL CITRATE (PF) 100 MCG/2ML IJ SOLN
INTRAMUSCULAR | Status: DC | PRN
  Administered 2019-09-19: 50 ug via INTRAVENOUS

## 2019-09-19 MED ORDER — HYDROCODONE-ACETAMINOPHEN 5-325 MG PO TABS
0.5000 | ORAL_TABLET | ORAL | Status: DC | PRN
  Administered 2019-09-19: 1 via ORAL
  Filled 2019-09-19: qty 1

## 2019-09-19 MED ORDER — ENOXAPARIN SODIUM 40 MG/0.4ML ~~LOC~~ SOLN: 40.0000 mg | SUBCUTANEOUS | 0 refills | Status: DC

## 2019-09-19 MED ORDER — ACETAMINOPHEN 325 MG PO TABS
325.0000 mg | ORAL_TABLET | Freq: Four times a day (QID) | ORAL | Status: DC | PRN
  Administered 2019-09-20 – 2019-09-25 (×8): 650 mg via ORAL
  Filled 2019-09-19 (×8): qty 2

## 2019-09-19 MED ORDER — HYDROCODONE-ACETAMINOPHEN 5-325 MG PO TABS: 0.5000 | ORAL_TABLET | Freq: Four times a day (QID) | ORAL | 0 refills | Status: DC | PRN

## 2019-09-19 MED ORDER — ONDANSETRON HCL 4 MG/2ML IJ SOLN
INTRAMUSCULAR | Status: AC
  Filled 2019-09-19: qty 2

## 2019-09-19 MED ORDER — LIDOCAINE 2% (20 MG/ML) 5 ML SYRINGE
INTRAMUSCULAR | Status: DC | PRN
  Administered 2019-09-19: 40 mg via INTRAVENOUS

## 2019-09-19 MED ORDER — EPHEDRINE SULFATE-NACL 50-0.9 MG/10ML-% IV SOSY
PREFILLED_SYRINGE | INTRAVENOUS | Status: DC | PRN
  Administered 2019-09-19: 10 mg via INTRAVENOUS

## 2019-09-19 MED ORDER — PROPOFOL 10 MG/ML IV BOLUS
INTRAVENOUS | Status: DC | PRN
  Administered 2019-09-19: 50 mg via INTRAVENOUS

## 2019-09-19 MED ORDER — LACTATED RINGERS IV SOLN
INTRAVENOUS | Status: DC
  Administered 2019-09-19: 18:00:00 via INTRAVENOUS

## 2019-09-19 MED ORDER — FENTANYL CITRATE (PF) 250 MCG/5ML IJ SOLN
INTRAMUSCULAR | Status: AC
  Filled 2019-09-19: qty 5

## 2019-09-19 MED ORDER — EPHEDRINE 5 MG/ML INJ
INTRAVENOUS | Status: AC
  Filled 2019-09-19: qty 10

## 2019-09-19 MED ORDER — ENOXAPARIN SODIUM 40 MG/0.4ML ~~LOC~~ SOLN
40.0000 mg | SUBCUTANEOUS | Status: DC
  Administered 2019-09-20 – 2019-09-26 (×7): 40 mg via SUBCUTANEOUS
  Filled 2019-09-19 (×7): qty 0.4

## 2019-09-19 MED ORDER — SUGAMMADEX SODIUM 200 MG/2ML IV SOLN
INTRAVENOUS | Status: DC | PRN
  Administered 2019-09-19: 100 mg via INTRAVENOUS

## 2019-09-19 MED ORDER — PROPOFOL 10 MG/ML IV BOLUS
INTRAVENOUS | Status: AC
  Filled 2019-09-19: qty 20

## 2019-09-19 MED ORDER — FENTANYL CITRATE (PF) 100 MCG/2ML IJ SOLN: 25.0000 ug | INTRAMUSCULAR | Status: DC | PRN

## 2019-09-19 MED ORDER — DEXAMETHASONE SODIUM PHOSPHATE 10 MG/ML IJ SOLN
INTRAMUSCULAR | Status: AC
  Filled 2019-09-19: qty 1

## 2019-09-19 MED ORDER — SODIUM CHLORIDE 0.9 % IR SOLN
Status: DC | PRN
  Administered 2019-09-19: 1000 mL

## 2019-09-19 MED ORDER — LACTATED RINGERS IV SOLN
INTRAVENOUS | Status: DC | PRN
  Administered 2019-09-19: 13:00:00 via INTRAVENOUS

## 2019-09-19 MED ORDER — ACETAMINOPHEN 500 MG PO TABS
500.0000 mg | ORAL_TABLET | Freq: Four times a day (QID) | ORAL | Status: AC
  Administered 2019-09-19 – 2019-09-20 (×4): 500 mg via ORAL
  Filled 2019-09-19 (×4): qty 1

## 2019-09-19 SURGICAL SUPPLY — 37 items
CLOSURE STERI-STRIP 1/2X4 (GAUZE/BANDAGES/DRESSINGS) ×1
CLOSURE STERI-STRIP 1/4X4 (GAUZE/BANDAGES/DRESSINGS) ×2 IMPLANT
CLOSURE WOUND 1/2 X4 (GAUZE/BANDAGES/DRESSINGS) ×1
CLSR STERI-STRIP ANTIMIC 1/2X4 (GAUZE/BANDAGES/DRESSINGS) ×2 IMPLANT
COVER MAYO STAND STRL (DRAPES) ×3 IMPLANT
COVER PERINEAL POST (MISCELLANEOUS) ×3 IMPLANT
COVER SURGICAL LIGHT HANDLE (MISCELLANEOUS) ×3 IMPLANT
COVER WAND RF STERILE (DRAPES) ×3 IMPLANT
DRAPE STERI IOBAN 125X83 (DRAPES) ×3 IMPLANT
DRSG MEPILEX BORDER 4X4 (GAUZE/BANDAGES/DRESSINGS) ×7 IMPLANT
DURAPREP 26ML APPLICATOR (WOUND CARE) ×3 IMPLANT
ELECT REM PT RETURN 9FT ADLT (ELECTROSURGICAL) ×3
ELECTRODE REM PT RTRN 9FT ADLT (ELECTROSURGICAL) ×1 IMPLANT
GLOVE BIO SURGEON STRL SZ7.5 (GLOVE) ×8 IMPLANT
GLOVE BIOGEL PI IND STRL 8 (GLOVE) ×2 IMPLANT
GLOVE BIOGEL PI INDICATOR 8 (GLOVE) ×6
GOWN STRL REUS W/ TWL LRG LVL3 (GOWN DISPOSABLE) ×2 IMPLANT
GOWN STRL REUS W/TWL LRG LVL3 (GOWN DISPOSABLE) ×4
GUIDEROD T2 3X1000 (ROD) ×2 IMPLANT
K-WIRE  3.2X450M STR (WIRE) ×2
K-WIRE 3.2X450M STR (WIRE) ×1
KIT BASIN OR (CUSTOM PROCEDURE TRAY) ×3 IMPLANT
KIT TURNOVER KIT B (KITS) ×3 IMPLANT
KWIRE 3.2X450M STR (WIRE) IMPLANT
MANIFOLD NEPTUNE II (INSTRUMENTS) ×3 IMPLANT
NAIL GAMMA LG R 5TI 10X360X125 (Nail) ×2 IMPLANT
NS IRRIG 1000ML POUR BTL (IV SOLUTION) ×3 IMPLANT
PACK GENERAL/GYN (CUSTOM PROCEDURE TRAY) ×3 IMPLANT
PAD ARMBOARD 7.5X6 YLW CONV (MISCELLANEOUS) ×6 IMPLANT
SCREW LAG GAMMA 3 95MM (Screw) ×2 IMPLANT
STRIP CLOSURE SKIN 1/2X4 (GAUZE/BANDAGES/DRESSINGS) ×2 IMPLANT
SUT MNCRL AB 4-0 PS2 18 (SUTURE) ×3 IMPLANT
SUT VIC AB 2-0 CT1 27 (SUTURE) ×2
SUT VIC AB 2-0 CT1 TAPERPNT 27 (SUTURE) ×1 IMPLANT
TOWEL GREEN STERILE (TOWEL DISPOSABLE) ×3 IMPLANT
TOWEL OR NON WOVEN STRL DISP B (DISPOSABLE) ×3 IMPLANT
WATER STERILE IRR 1000ML POUR (IV SOLUTION) ×3 IMPLANT

## 2019-09-19 NOTE — Plan of Care (Signed)

## 2019-09-19 NOTE — Progress Notes (Signed)
Progress Note    Jessica [name]  [id number] DOB: 1926/06/13  DOA: 09/17/2019 PCP: [name], [name], MD    Brief Narrative:     Medical records reviewed and are as summarized below:  Jessica [name] is an 85 y.o. female with medical history significant for severe aortic stenosis status post valve replacement, COPD with chronic hypoxic respiratory failure, fibromyalgia, generalized anxiety disorder, now presenting to the emergency department for evaluation of severe right hip pain that has kept her from ambulating.  Patient reports that she fell out of her bed 1 to 2 weeks ago, hit the back of her head on a piece of furniture, and had been experiencing some right hip pain since then.  She reports that she had forgotten about the fall until couple days ago when the pain began to get severe and keep her from ambulating.  She reports using a cane at baseline.    Assessment/Plan:   Principal Problem:   Acute right hip pain Active Problems:   Generalized anxiety disorder   COPD (chronic obstructive pulmonary disease) (HCC)   CAD (coronary artery disease)   Chronic diastolic CHF (congestive heart failure) (HCC)   Acute right hip pain  - Patient reports falling out of bed 1-2 wks ago and presents with severe right hip pain that is keeping her from ambulating - CT hip is negative for acute fracture, notable for small joint effusion and mineralized loose bodies  - Orthopedic surgery is consulting and much appreciated- plan for IM nail - MRI: 1. Irregular area low T1 signal intensity and associated mild marrow edema within the intertrochanteric region of the proximal right femur suggestive of a developing insufficiency fracture. 2. Small joint effusion with synovitis. 3. Moderate osteoarthritis of the right hip joint. 4. Mild iliopsoas and peritrochanteric bursitis. 5. Mild diffuse marrow heterogeneity without discrete marrow replacing lesion. Findings are nonspecific but can be seen  in the setting of chronic anemia, smoking, and/or obesity. -vit D levels low- will need replacement after surgery  COPD; chronic hypoxic respiratory failure   - Appears stable  - Continue supplemental O2, LABA, as-needed SABA   CAD  - No anginal complaints  - Continue statin, hold ASA pending further hip imaging     Chronic diastolic CHF  - EF was [id number] in July 2020  - Appears compensated, no longer taking Lasix, will SLIV     Anxiety  - Continue low-dose Xanax and Lexapro      Family Communication/Anticipated D/C date and plan/Code Status   DVT prophylaxis: scd Code Status: Full Code.  Family Communication: ortho spoke with daughter Disposition Plan: plan for IM nail today   Medical Consultants:    ortho     Subjective:   sleeping  Objective:    Vitals:   09/18/19 1628 09/18/19 2043 09/18/19 2300 09/19/19 0602  BP: 132/71 (!) 142/62 (!) 122/53 (!) 165/88  Pulse: 67 69 65 63  Resp: 18 18 18 18   Temp: 98.7 F (37.1 C) 98.1 F (36.7 C) 97.7 F (36.5 C) 97.7 F (36.5 C)  TempSrc: Oral Oral Oral Oral  SpO2: 100% 96% 97% 98%    Intake/Output Summary (Last 24 hours) at 09/19/2019 0810 Last data filed at 09/19/2019 [id number] Gross per 24 hour  Intake 956 ml  Output 1000 ml  Net -44 ml   There were no vitals filed for this visit.  Exam: Sleeping soundly NAD rrr No increased work of breathing  Data Reviewed:   I  have personally reviewed following labs and imaging studies:  Labs: Labs show the following:   Basic Metabolic Panel: Recent Labs  Lab 09/17/19 1547 09/18/19 0507  NA 139 141  K 3.6 4.3  CL 98 98  CO2 31 33*  GLUCOSE 109* 147*  BUN 12 15  CREATININE 0.83 0.71  CALCIUM 9.3 9.4   GFR CrCl cannot be calculated (Unknown ideal weight.). Liver Function Tests: No results for input(s): AST, ALT, ALKPHOS, BILITOT, PROT, ALBUMIN in the last 168 hours. No results for input(s): LIPASE, AMYLASE in the last 168 hours. No results for  input(s): AMMONIA in the last 168 hours. Coagulation profile Recent Labs  Lab 09/17/19 1547  INR 1.0    CBC: Recent Labs  Lab 09/17/19 1547 09/18/19 0507  WBC 8.2 4.7  NEUTROABS 7.2 4.2  HGB 13.0 12.8  HCT 41.0 40.5  MCV 103.0* 102.0*  PLT 198 210   Cardiac Enzymes: No results for input(s): CKTOTAL, CKMB, CKMBINDEX, TROPONINI in the last 168 hours. BNP (last 3 results) No results for input(s): PROBNP in the last 8760 hours. CBG: No results for input(s): GLUCAP in the last 168 hours. D-Dimer: No results for input(s): DDIMER in the last 72 hours. Hgb A1c: No results for input(s): HGBA1C in the last 72 hours. Lipid Profile: No results for input(s): CHOL, HDL, LDLCALC, TRIG, CHOLHDL, LDLDIRECT in the last 72 hours. Thyroid function studies: No results for input(s): TSH, T4TOTAL, T3FREE, THYROIDAB in the last 72 hours.  Invalid input(s): FREET3 Anemia work up: No results for input(s): VITAMINB12, FOLATE, FERRITIN, TIBC, IRON, RETICCTPCT in the last 72 hours. Sepsis Labs: Recent Labs  Lab 09/17/19 1547 09/18/19 0507  WBC 8.2 4.7    Microbiology Recent Results (from the past 240 hour(s))  SARS CORONAVIRUS 2 (TAT 6-24 HRS) Nasopharyngeal Nasopharyngeal Swab     Status: None   Collection Time: 09/17/19  7:19 PM   Specimen: Nasopharyngeal Swab  Result Value Ref Range Status   SARS Coronavirus 2 NEGATIVE NEGATIVE Final    Comment: (NOTE) SARS-CoV-2 target nucleic acids are NOT DETECTED. The SARS-CoV-2 RNA is generally detectable in upper and lower respiratory specimens during the acute phase of infection. Negative results do not preclude SARS-CoV-2 infection, do not rule out co-infections with other pathogens, and should not be used as the sole basis for treatment or other patient management decisions. Negative results must be combined with clinical observations, patient history, and epidemiological information. The expected result is Negative. Fact Sheet for  Patients: SugarRoll.be Fact Sheet for Healthcare Providers: https://www.woods-mathews.com/ This test is not yet approved or cleared by the [place] FDA and  has been authorized for detection and/or diagnosis of SARS-CoV-2 by FDA under an Emergency Use Authorization (EUA). This EUA will remain  in effect (meaning this test can be used) for the duration of the COVID-19 declaration under Section 56 4(b)(1) of the Act, 21 U.S.C. section 360bbb-3(b)(1), unless the authorization is terminated or revoked sooner. Performed at [facility], [place] [address], [place], [place] [address]   Surgical pcr screen     Status: None   Collection Time: 09/18/19  2:37 PM   Specimen: Nasal Mucosa; Nasal Swab  Result Value Ref Range Status   MRSA, PCR NEGATIVE NEGATIVE Final   Staphylococcus aureus NEGATIVE NEGATIVE Final    Comment: (NOTE) The Xpert SA Assay (FDA approved for NASAL specimens in patients 29 years of age and older), is one component of a comprehensive surveillance program. It is not intended to  diagnose infection nor to guide or monitor treatment. Performed at [facility], [place] [address], [place], [place] [address]     Procedures and diagnostic studies:  Ct Head Wo Contrast  Result Date: 09/17/2019 CLINICAL DATA:  Minor head trauma. High clinical risk. EXAM: CT HEAD WITHOUT CONTRAST CT CERVICAL SPINE WITHOUT CONTRAST TECHNIQUE: Multidetector CT imaging of the head and cervical spine was performed following the standard protocol without intravenous contrast. Multiplanar CT image reconstructions of the cervical spine were also generated. COMPARISON:  09/09/2007 MRI brain. FINDINGS: CT HEAD FINDINGS Brain: Tiny chronic appearing right basal ganglia lacune. No evidence of parenchymal hemorrhage or extra-axial fluid collection. No mass lesion, mass effect, or midline shift. No CT evidence of acute infarction. Generalized cerebral volume  loss. Nonspecific mild subcortical and periventricular white matter hypodensity, most in keeping with chronic small vessel ischemic change. No ventriculomegaly. Vascular: No acute abnormality. Skull: No evidence of calvarial fracture. Sinuses/Orbits: The visualized paranasal sinuses are essentially clear. Other:  The mastoid air cells are unopacified. CT CERVICAL SPINE FINDINGS Alignment: Normal cervical lordosis. No facet subluxation. Dens is well positioned between the lateral masses of C1. Skull base and vertebrae: No acute fracture. No primary bone lesion or focal pathologic process. Healed deformity in lateral right clavicle, partially visualized. Soft tissues and spinal canal: No prevertebral edema. No visible canal hematoma. Disc levels: Status post ACDF C3-4 with no evidence of hardware fracture or loosening. Ankylosis of the C3-4 disc. Marked degenerative disc disease at C4-5, C5-6 and C6-7. Mild-to-moderate bilateral cervical facet arthropathy. Mild degenerative foraminal stenosis on the left at C3-4. Moderate degenerative foraminal stenosis bilaterally at C4-5. Mild degenerative foraminal stenosis bilaterally at C5-6 and C6-7. Upper chest: No acute abnormality. Other: Visualized mastoid air cells appear clear. No discrete thyroid nodules. No pathologically enlarged cervical nodes. IMPRESSION: 1. No evidence of acute intracranial abnormality. No evidence of calvarial fracture. 2. Generalized cerebral volume loss and mild chronic small vessel ischemic changes in the cerebral white matter. Tiny chronic appearing right basal ganglia lacune. 3. No cervical spine fracture or subluxation. 4. Status post C3-4 ACDF with no evidence of hardware complication. 5. Marked degenerative changes in the cervical spine as detailed. Electronically Signed   By: Ilona Sorrel M.D.   On: 09/17/2019 16:20   Ct Cervical Spine Wo Contrast  Result Date: 09/17/2019 CLINICAL DATA:  Minor head trauma. High clinical risk. EXAM: CT  HEAD WITHOUT CONTRAST CT CERVICAL SPINE WITHOUT CONTRAST TECHNIQUE: Multidetector CT imaging of the head and cervical spine was performed following the standard protocol without intravenous contrast. Multiplanar CT image reconstructions of the cervical spine were also generated. COMPARISON:  09/09/2007 MRI brain. FINDINGS: CT HEAD FINDINGS Brain: Tiny chronic appearing right basal ganglia lacune. No evidence of parenchymal hemorrhage or extra-axial fluid collection. No mass lesion, mass effect, or midline shift. No CT evidence of acute infarction. Generalized cerebral volume loss. Nonspecific mild subcortical and periventricular white matter hypodensity, most in keeping with chronic small vessel ischemic change. No ventriculomegaly. Vascular: No acute abnormality. Skull: No evidence of calvarial fracture. Sinuses/Orbits: The visualized paranasal sinuses are essentially clear. Other:  The mastoid air cells are unopacified. CT CERVICAL SPINE FINDINGS Alignment: Normal cervical lordosis. No facet subluxation. Dens is well positioned between the lateral masses of C1. Skull base and vertebrae: No acute fracture. No primary bone lesion or focal pathologic process. Healed deformity in lateral right clavicle, partially visualized. Soft tissues and spinal canal: No prevertebral edema. No visible canal hematoma. Disc levels: Status  post ACDF C3-4 with no evidence of hardware fracture or loosening. Ankylosis of the C3-4 disc. Marked degenerative disc disease at C4-5, C5-6 and C6-7. Mild-to-moderate bilateral cervical facet arthropathy. Mild degenerative foraminal stenosis on the left at C3-4. Moderate degenerative foraminal stenosis bilaterally at C4-5. Mild degenerative foraminal stenosis bilaterally at C5-6 and C6-7. Upper chest: No acute abnormality. Other: Visualized mastoid air cells appear clear. No discrete thyroid nodules. No pathologically enlarged cervical nodes. IMPRESSION: 1. No evidence of acute intracranial  abnormality. No evidence of calvarial fracture. 2. Generalized cerebral volume loss and mild chronic small vessel ischemic changes in the cerebral white matter. Tiny chronic appearing right basal ganglia lacune. 3. No cervical spine fracture or subluxation. 4. Status post C3-4 ACDF with no evidence of hardware complication. 5. Marked degenerative changes in the cervical spine as detailed. Electronically Signed   By: Ilona Sorrel M.D.   On: 09/17/2019 16:20   Ct Hip Right Wo Contrast  Result Date: 09/17/2019 CLINICAL DATA:  Right hip pain after fall 1 week ago EXAM: CT OF THE RIGHT HIP WITHOUT CONTRAST TECHNIQUE: Multidetector CT imaging of the right hip was performed according to the standard protocol. Multiplanar CT image reconstructions were also generated. COMPARISON:  CT 08/24/2018 FINDINGS: Bones/Joint/Cartilage The right hip is intact without fracture or dislocation. Moderate osteoarthritis of the right hip joint with joint space narrowing, subchondral cystic changes, and marginal osteophyte formation. There is mineralization within the area of the anterosuperior labrum. Enthesopathic changes are present at the greater trochanter and ischial tuberosity. The right SI joint and pubic symphysis are intact with mild-to-moderate degenerative changes. There is chondrocalcinosis at the symphysis pubis. There is suggestion of a small hip joint effusion with a few small mineralized loose bodies. Ligaments Suboptimally assessed by CT. Muscles and Tendons Fatty infiltration of the gluteus minimus muscle likely related to chronic tendon tear. No evidence of an acute muscular or tendinous injury. Soft tissues No soft tissue hematoma or fluid collection. IMPRESSION: 1. No acute fracture or dislocation of the right hip. 2. Moderate osteoarthritis of the right hip joint with small joint effusion and a few small mineralized loose bodies. Electronically Signed   By: Davina Poke M.D.   On: 09/17/2019 16:22   Mr Hip  Right Wo Contrast  Result Date: 09/18/2019 CLINICAL DATA:  Severe right hip pain EXAM: MR OF THE RIGHT HIP WITHOUT CONTRAST TECHNIQUE: Multiplanar, multisequence MR imaging was performed. No intravenous contrast was administered. COMPARISON:  CT 09/17/2019 FINDINGS: Bones: Within the intertrochanteric aspect of the proximal right femur is irregular T1 hypointense signal which predominates near the base of the lesser trochanter (series 4, images 23-25). There is mild associated marrow edema within this region. No displaced fracture component. Right hip joint is intact without dislocation. Mild diffuse marrow heterogeneity without discrete marrow replacing lesion. Findings are nonspecific but can be seen in the setting of chronic anemia, smoking, and/or obesity. Articular cartilage and labrum Articular cartilage: Moderate diffuse cartilage thinning and surface irregularity. Labrum:  Diffuse degeneration. Joint or bursal effusion Joint effusion:  Small joint effusion with synovitis. Bursae: Small amount of iliopsoas bursal fluid. There is small peritrochanteric bursal fluid and edema. Muscles and tendons Muscles and tendons: Tendinosis with low-grade partial tearing of the insertional fibers of the gluteus minimus tendon. Bilateral hamstring tendinosis. Intramuscular edema about the right hip particularly involving the vastus lateralis and vastus intermedius muscles as well as the abductor muscle group. Other findings Miscellaneous:   No soft tissue hematoma. Colonic diverticulosis. IMPRESSION: 1.  Irregular area low T1 signal intensity and associated mild marrow edema within the intertrochanteric region of the proximal right femur suggestive of a developing insufficiency fracture. 2. Small joint effusion with synovitis. 3. Moderate osteoarthritis of the right hip joint. 4. Mild iliopsoas and peritrochanteric bursitis. 5. Mild diffuse marrow heterogeneity without discrete marrow replacing lesion. Findings are  nonspecific but can be seen in the setting of chronic anemia, smoking, and/or obesity. Electronically Signed   By: Davina Poke M.D.   On: 09/18/2019 09:17    Medications:    ALPRAZolam  0.5 mg Oral QHS   arformoterol  15 mcg Nebulization BID   escitalopram  20 mg Oral q morning - 10a   lidocaine  1 patch Transdermal Q24H   simvastatin  20 mg Oral q1800   Continuous Infusions:   ceFAZolin (ANCEF) IV     tranexamic acid       LOS: 1 day   Geradine Girt  [facility]   How to contact the [facility] Attending or Consulting provider [place] or covering provider during after hours [place], for this patient?  1. Check the care team in [facility] and look for a) attending/consulting [facility] provider listed and b) the [facility] team listed 2. Log into [email or web address] and use 's universal password to access. If you do not have the password, please contact the hospital operator. 3. Locate the [facility] provider you are looking for under [facility] and page to a number that you can be directly reached. 4. If you still have difficulty reaching the provider, please page the [facility] (Director on Call) for the Hospitalists listed on amion for assistance.  09/19/2019, 8:10 AM

## 2019-09-19 NOTE — Anesthesia Procedure Notes (Signed)
Procedure Name: Intubation Date/Time: 09/19/2019 2:01 PM Performed by: Moshe Salisbury, CRNA Pre-anesthesia Checklist: Patient identified, Emergency Drugs available, Suction available and Patient being monitored Patient Re-evaluated:Patient Re-evaluated prior to induction Oxygen Delivery Method: Circle System Utilized Preoxygenation: Pre-oxygenation with 100% oxygen Induction Type: IV induction Ventilation: Mask ventilation without difficulty Laryngoscope Size: Mac and 3 Grade View: Grade I Tube type: Oral Tube size: 7.5 mm Number of attempts: 1 Airway Equipment and Method: Stylet Placement Confirmation: ETT inserted through vocal cords under direct vision,  positive ETCO2 and breath sounds checked- equal and bilateral Secured at: 20 cm Tube secured with: Tape Dental Injury: Teeth and Oropharynx as per pre-operative assessment

## 2019-09-19 NOTE — Telephone Encounter (Signed)
We need imaging of affected knee to evaluate for fractures or other acute process. Thanks, BJ

## 2019-09-19 NOTE — Transfer of Care (Signed)
Immediate Anesthesia Transfer of Care Note  Patient: Jessica [name]  Procedure(s) Performed: INTRAMEDULLARY (IM) NAIL INTERTROCHANTRIC (Right )  Patient Location: PACU  Anesthesia Type:General  Level of Consciousness: drowsy and patient cooperative  Airway & Oxygen Therapy: Patient Spontanous Breathing and Patient connected to nasal cannula oxygen  Post-op Assessment: Report given to RN, Post -op Vital signs reviewed and stable and Patient moving all extremities  Post vital signs: Reviewed and stable  Last Vitals:  Vitals Value Taken Time  BP    Temp    Pulse 74 09/19/19 1505  Resp 16 09/19/19 1505  SpO2 100 % 09/19/19 1505  Vitals shown include unvalidated device data.  Last Pain:  Vitals:   09/19/19 1041  TempSrc: Oral  PainSc: 0-No pain      Patients Stated Pain Goal: 0 ([id number])  Complications: No apparent anesthesia complications

## 2019-09-19 NOTE — Op Note (Signed)
DATE OF SURGERY:  09/19/2019  TIME: 2:40 PM  PATIENT NAME:  Jessica [name]  AGE: 85 y.o.  PRE-OPERATIVE DIAGNOSIS:  hip fracture  POST-OPERATIVE DIAGNOSIS:  SAME  PROCEDURE:  INTRAMEDULLARY (IM) NAIL INTERTROCHANTRIC  SURGEON:  Renette Butters  ASSISTANT:  Roxan Hockey, PA-C, he was present and scrubbed throughout the case, critical for completion in a timely fashion, and for retraction, instrumentation, and closure.   OPERATIVE IMPLANTS: Stryker Gamma Nail  PREOPERATIVE INDICATIONS:  [name] is a 84 y.o. year old who fell and suffered a hip fracture. She was brought into the ER and then admitted and optimized and then elected for surgical intervention.    The risks benefits and alternatives were discussed with the patient including but not limited to the risks of nonoperative treatment, versus surgical intervention including infection, bleeding, nerve injury, malunion, nonunion, hardware prominence, hardware failure, need for hardware removal, blood clots, cardiopulmonary complications, morbidity, mortality, among others, and they were willing to proceed.    OPERATIVE PROCEDURE:  The patient was brought to the operating room and placed in the supine position. General anesthesia was administered. She was placed on the fracture table. Time out was then performed after sterile prep and drape. She received preoperative antibiotics.  Incision was made proximal to the greater trochanter. A guidewire was placed in the appropriate position. Confirmation was made on AP and lateral views. The above-named nail was opened. I opened the proximal femur with a reamer. I then placed the nail by hand easily down. I did not need to ream the femur.  Once the nail was completely seated, I placed a guidepin into the femoral head into the center center position. I measured the length, and then reamed the lateral cortex and up into the head. I then placed the lag screw. Slight compression was  applied. Anatomic fixation achieved. Bone quality was mediocre.  I then secured the proximal interlocking bolt, and took off a half a turn, and then removed the instruments, and took final C-arm pictures AP and lateral the entire length of the leg.   Anatomic reconstruction was achieved, and the wounds were irrigated copiously and closed with Vicryl followed by staples and sterile gauze for the skin. The patient was awakened and returned to PACU in stable and satisfactory condition. There no complications and the patient tolerated the procedure well.  She will be weightbearing as tolerated, and will be on chemical px  for a period of four weeks after discharge.   Edmonia Lynch, M.D.

## 2019-09-19 NOTE — Interval H&P Note (Signed)
I participated in the care of this patient and agree with the above history, physical and evaluation. I performed a review of the history and a physical exam as detailed   Belton Peplinski Daniel Leilah Polimeni MD  

## 2019-09-19 NOTE — Anesthesia Preprocedure Evaluation (Addendum)
Anesthesia Evaluation  Patient identified by MRN, date of birth, ID band Patient awake    Reviewed: Allergy & Precautions, H&P , NPO status , Patient's Chart, lab work & pertinent test results  Airway Mallampati: II  TM Distance: >3 FB Neck ROM: Full    Dental no notable dental hx. (+) Edentulous Upper, Edentulous Lower, Dental Advisory Given   Pulmonary COPD,  COPD inhaler, former smoker,    Pulmonary exam normal breath sounds clear to auscultation       Cardiovascular + CAD, + Cardiac Stents and +CHF   Rhythm:Regular Rate:Normal     Neuro/Psych Anxiety Depression negative neurological ROS     GI/Hepatic negative GI ROS, Neg liver ROS,   Endo/Other  negative endocrine ROS  Renal/GU negative Renal ROS  negative genitourinary   Musculoskeletal  (+) Arthritis , Osteoarthritis,  Fibromyalgia -  Abdominal   Peds  Hematology negative hematology ROS (+)   Anesthesia Other Findings   Reproductive/Obstetrics negative OB ROS                            Anesthesia Physical Anesthesia Plan  ASA: III  Anesthesia Plan: General   Post-op Pain Management:    Induction: Intravenous  PONV Risk Score and Plan: 4 or greater and Ondansetron, Dexamethasone and Treatment may vary due to age or medical condition  Airway Management Planned: Oral ETT  Additional Equipment:   Intra-op Plan:   Post-operative Plan: Extubation in OR  Informed Consent: I have reviewed the patients History and Physical, chart, labs and discussed the procedure including the risks, benefits and alternatives for the proposed anesthesia with the patient or authorized representative who has indicated his/her understanding and acceptance.     Dental advisory given  Plan Discussed with: CRNA  Anesthesia Plan Comments:         Anesthesia Quick Evaluation

## 2019-09-19 NOTE — Anesthesia Postprocedure Evaluation (Signed)
Anesthesia Post Note  Patient: Jessica [name]  Procedure(s) Performed: INTRAMEDULLARY (IM) NAIL INTERTROCHANTRIC (Right )     Patient location during evaluation: PACU Anesthesia Type: General Level of consciousness: awake and alert Pain management: pain level controlled Vital Signs Assessment: post-procedure vital signs reviewed and stable Respiratory status: spontaneous breathing, nonlabored ventilation, respiratory function stable and patient connected to nasal cannula oxygen Cardiovascular status: blood pressure returned to baseline and stable Postop Assessment: no apparent nausea or vomiting Anesthetic complications: no    Last Vitals:  Vitals:   09/19/19 1533 09/19/19 1549  BP: (!) 160/63 (!) 155/68  Pulse: 60 69  Resp: 16 (!) 24  Temp:    SpO2: 100% 90%    Last Pain:  Vitals:   09/19/19 1533  TempSrc:   PainSc: 0-No pain                 Rondo Spittler,W. EDMOND

## 2019-09-19 NOTE — Telephone Encounter (Signed)
Pt was taken to Urgent care where imaging was done.

## 2019-09-20 ENCOUNTER — Encounter (HOSPITAL_COMMUNITY): Payer: Self-pay | Admitting: Orthopedic Surgery

## 2019-09-20 LAB — BASIC METABOLIC PANEL
Anion gap: 8 (ref 5–15)
BUN: 15 mg/dL (ref 8–23)
CO2: 32 mmol/L (ref 22–32)
Calcium: 9 mg/dL (ref 8.9–10.3)
Chloride: 99 mmol/L (ref 98–111)
Creatinine, Ser: 0.96 mg/dL (ref 0.44–1.00)
GFR calc Af Amer: 60 mL/min — ABNORMAL LOW (ref >60)
GFR calc non Af Amer: 52 mL/min — ABNORMAL LOW (ref >60)
Glucose, Bld: 156 mg/dL — ABNORMAL HIGH (ref 70–99)
Potassium: 4.9 mmol/L (ref 3.5–5.1)
Sodium: 139 mmol/L (ref 135–145)

## 2019-09-20 LAB — CBC
HCT: 36 % (ref 36.0–46.0)
Hemoglobin: 11.3 g/dL — ABNORMAL LOW (ref 12.0–15.0)
MCH: 32.5 pg (ref 26.0–34.0)
MCHC: 31.4 g/dL (ref 30.0–36.0)
MCV: 103.4 fL — ABNORMAL HIGH (ref 80.0–100.0)
Platelets: 209 10*3/uL (ref 150–400)
RBC: 3.48 MIL/uL — ABNORMAL LOW (ref 3.87–5.11)
RDW: 14.1 % (ref 11.5–15.5)
WBC: 6.9 10*3/uL (ref 4.0–10.5)
nRBC: 0 % (ref 0.0–0.2)

## 2019-09-20 MED ORDER — BUDESONIDE 0.5 MG/2ML IN SUSP
0.5000 mg | Freq: Two times a day (BID) | RESPIRATORY_TRACT | Status: DC
  Administered 2019-09-20 – 2019-09-25 (×12): 0.5 mg via RESPIRATORY_TRACT
  Filled 2019-09-20 (×13): qty 2

## 2019-09-20 MED ORDER — SENNOSIDES-DOCUSATE SODIUM 8.6-50 MG PO TABS: 2.0000 | ORAL_TABLET | Freq: Every evening | ORAL | Status: DC | PRN

## 2019-09-20 MED ORDER — ASPIRIN EC 81 MG PO TBEC
81.0000 mg | DELAYED_RELEASE_TABLET | Freq: Every day | ORAL | Status: DC
  Administered 2019-09-20 – 2019-09-26 (×7): 81 mg via ORAL
  Filled 2019-09-20 (×7): qty 1

## 2019-09-20 MED ORDER — LORATADINE 10 MG PO TABS
10.0000 mg | ORAL_TABLET | Freq: Every day | ORAL | Status: DC
  Administered 2019-09-20 – 2019-09-26 (×7): 10 mg via ORAL
  Filled 2019-09-20 (×7): qty 1

## 2019-09-20 MED ORDER — VITAMIN D 25 MCG (1000 UNIT) PO TABS
1000.0000 [IU] | ORAL_TABLET | Freq: Every day | ORAL | Status: DC
  Administered 2019-09-20: 10:00:00 1000 [IU] via ORAL
  Filled 2019-09-20: qty 1

## 2019-09-20 MED ORDER — PANTOPRAZOLE SODIUM 40 MG PO TBEC
40.0000 mg | DELAYED_RELEASE_TABLET | Freq: Every day | ORAL | Status: DC
  Administered 2019-09-20 – 2019-09-26 (×7): 40 mg via ORAL
  Filled 2019-09-20 (×7): qty 1

## 2019-09-20 NOTE — Plan of Care (Signed)
  Problem: Education: Goal: Knowledge of General Education information will improve Description: Including pain rating scale, medication(s)/side effects and non-pharmacologic comfort measures Outcome: Adequate for Discharge   

## 2019-09-20 NOTE — Plan of Care (Signed)
°  Problem: Clinical Measurements: °Goal: Ability to maintain clinical measurements within normal limits will improve °Outcome: Progressing °  °Problem: Clinical Measurements: °Goal: Diagnostic test results will improve °Outcome: Progressing °  °

## 2019-09-20 NOTE — Progress Notes (Deleted)
Cardiology Office Note  Date:  09/20/2019   ID:  Jessica [name], DOB 1926/05/06, MRN [id number]  PCP:  [name], [name], MD   No chief complaint on file.   HPI:  [name]  is an 85 y/o woman with  mild to moderate aortic valve stenosis,  h/o HL,  fibromyalgia,  CAD, aortic athero on CT 2016 anxiety  moderate COPD/emphysema,  asthmatic bronchitis Previous smoking history but stopped over 30 years ago.  total knee replacement July 2013 by her report Mild gait instability, walks with a cane.  Previous history of falls with trauma to her face/forehead requiring stitches. 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  SOB walking to bathroom Family presents with her and feels the shortness of breath is getting worse Denies any lower extremity edema weight gain abdominal swelling  She has mild weakness but this is stable No PND orthopnea Denies any significant chest pain No orthostasis symptoms  EKG personally reviewed by myself on todays visit Shows normal sinus rhythm rate 82 bpm nonspecific ST abnormality  Other past medical history reviewed Still working at [employer] , does not want to retire   she continues to work on Production assistant, radio Using a neb treatment, breathing better, spitting up, thick "stuff" Pulmonary, Dr. Mortimer Fries,   significant exposure to secondhand smoke from her daughter   Previous fall Valentine's Day 2017, fractured her right clavicle ( aortic calcification seen on chest x-ray  At that time)  no surgery was performed  difficulty with her memory per the family    said details of her story over and over on her visit today  Echocardiogram April 2016 showing mild to moderate aortic valve stenosis Carotid ultrasound November 2015 showing mild bilateral carotid  disease  Previous trip to the ER with CP in 8/11.  Had post hospital Myoview in 9/11 with normal EF and question of inferior ischemia.   medical therapy pursued at that time. Asymptomatic since then  No recurrent CP or undue dyspnea. Does get fatigued. No palpitations, CHF or syncope.      PMH:   has a past medical history of Anxiety, congenital nystagmus, COPD (chronic obstructive pulmonary disease) ([place]), Coronary artery disease, DJD (degenerative joint disease), Fibromyalgia, Low back pain syndrome, Memory loss, Other and unspecified hyperlipidemia, Pancreatic lesion, Pneumonia, S/P TAVR (transcatheter aortic valve replacement), Severe aortic stenosis, and Venous insufficiency.  PSH:    Past Surgical History:  Procedure Laterality Date  . ABDOMINAL HYSTERECTOMY    . ANTERIOR CERVICAL DISCECTOMY    . APPENDECTOMY    . CARPAL TUNNEL RELEASE  12/2011   right arm  . CATARACT EXTRACTION    . CORONARY ATHERECTOMY  07/23/2018   PTCA/orbital atherectomy/DES x 1 proximal to mid LAD  . CORONARY ATHERECTOMY N/A 07/23/2018   Procedure: CORONARY ATHERECTOMY;  Surgeon: Burnell Blanks, MD;  Location: [place] [facility];  Service: Cardiovascular;  Laterality: N/A;  . CORONARY STENT INTERVENTION    . CORONARY STENT INTERVENTION N/A 07/23/2018   Procedure: CORONARY STENT INTERVENTION;  Surgeon: Burnell Blanks, MD;  Location: [place] [facility];  Service: Cardiovascular;  Laterality: N/A;  . EYE SURGERY    . INTRAMEDULLARY (IM) NAIL INTERTROCHANTERIC Right 09/19/2019   Procedure: INTRAMEDULLARY (IM) NAIL INTERTROCHANTRIC;  Surgeon: Renette Butters, MD;  Location: [place];  Service: Orthopedics;  Laterality: Right;  . LUMBAR LAMINECTOMY    . right knee arthroscopy    . right shoulder replacement  04/2009  . right shoulder surgery  2008   Dr. Noemi Chapel  . RIGHT/LEFT HEART CATH AND CORONARY ANGIOGRAPHY N/A 07/07/2018   Procedure: RIGHT/LEFT HEART CATH AND CORONARY ANGIOGRAPHY;  Surgeon:  Burnell Blanks, MD;  Location: [place] [facility];  Service: Cardiovascular;  Laterality: N/A;  . TEE WITHOUT CARDIOVERSION N/A 03/29/2019   Procedure: TRANSESOPHAGEAL ECHOCARDIOGRAM (TEE);  Surgeon: Burnell Blanks, MD;  Location: [place];  Service: Open Heart Surgery;  Laterality: N/A;  . TRANSCATHETER AORTIC VALVE REPLACEMENT, TRANSFEMORAL N/A 03/29/2019   Procedure: TRANSCATHETER AORTIC VALVE REPLACEMENT, TRANSFEMORAL;  Surgeon: Burnell Blanks, MD;  Location: [place];  Service: Open Heart Surgery;  Laterality: N/A;  . ULTRASOUND GUIDANCE FOR VASCULAR ACCESS  07/07/2018   Procedure: Ultrasound Guidance For Vascular Access;  Surgeon: Burnell Blanks, MD;  Location: [facility];  Service: Cardiovascular;;    No current facility-administered medications for this visit.    Current Outpatient Medications  Medication Sig Dispense Refill  . enoxaparin (LOVENOX) 40 MG/0.4ML injection Inject 0.4 mLs (40 mg total) into the skin daily for 30 doses. For 30 days post op for DVT prophylaxis 12 mL 0  . HYDROcodone-acetaminophen (NORCO) 5-325 MG tablet Take 0.5-1 tablets by mouth every 6 (six) hours as needed for severe pain. 10 tablet 0   Facility-Administered Medications Ordered in Other Visits  Medication Dose Route Frequency Provider Last Rate Last Dose  . acetaminophen (TYLENOL) tablet 325-650 mg  325-650 mg Oral Q6H PRN Prudencio Burly III, PA-C   650 mg at 09/20/19 1726  . albuterol (PROVENTIL) (2.5 MG/3ML) 0.083% nebulizer solution 2.5 mg  2.5 mg Inhalation Q6H PRN Prudencio Burly III, PA-C   2.5 mg at 09/18/19 0044  . ALPRAZolam Duanne Moron) tablet 0.5 mg  0.5 mg Oral QHS Prudencio Burly III, PA-C   0.5 mg at 09/19/19 2126  . arformoterol (BROVANA) nebulizer solution 15 mcg  15 mcg Nebulization BID Prudencio Burly III, PA-C   15 mcg at 09/20/19 0844  . aspirin EC tablet 81 mg  81 mg Oral Daily Amin, Ankit Chirag, MD   81 mg at 09/20/19 1030   . budesonide (PULMICORT) nebulizer solution 0.5 mg  0.5 mg Nebulization BID Amin, Ankit Chirag, MD   0.5 mg at 09/20/19 0844  . cholecalciferol (VITAMIN D3) tablet 1,000 Units  1,000 Units Oral Daily Damita Lack, MD   1,000 Units at 09/20/19 1029  . docusate sodium (COLACE) capsule 100 mg  100 mg Oral BID Prudencio Burly III, PA-C   100 mg at 09/20/19 1030  . enoxaparin (LOVENOX) injection 40 mg  40 mg Subcutaneous Q24H Prudencio Burly III, PA-C   40 mg at 09/20/19 [id number]  . escitalopram (LEXAPRO) tablet 20 mg  20 mg Oral q morning - 10a Prudencio Burly III, PA-C   20 mg at 09/20/19 1030  . HYDROcodone-acetaminophen (NORCO/VICODIN) 5-325 MG per tablet 0.5-1 tablet  0.5-1 tablet Oral Q4H PRN Prudencio Burly III, PA-C   1 tablet at 09/19/19 2126  . lactated ringers infusion   Intravenous Continuous Prudencio Burly III, PA-C 50 mL/hr at 09/20/19 0300    . lidocaine (LIDODERM) 5 % 1 patch  1 patch Transdermal Q24H Prudencio Burly III, PA-C   1 patch at 09/20/19 1104  . loratadine (CLARITIN) tablet 10 mg  10  mg Oral Daily Amin, Ankit Chirag, MD   10 mg at 09/20/19 1029  . morphine 2 MG/ML injection 1.5 mg  1.5 mg Intravenous Q4H PRN Prudencio Burly III, PA-C   1.5 mg at 09/18/19 0749  . ondansetron (ZOFRAN) tablet 4 mg  4 mg Oral Q6H PRN Prudencio Burly III, PA-C       Or  . ondansetron [facility]) injection 4 mg  4 mg Intravenous Q6H PRN Prudencio Burly III, PA-C   4 mg at 09/19/19 2126  . pantoprazole (PROTONIX) EC tablet 40 mg  40 mg Oral Q1200 Amin, Ankit Chirag, MD   40 mg at 09/20/19 1105  . polyethylene glycol (MIRALAX / GLYCOLAX) packet 17 [name]  17 [name] Oral Daily PRN Prudencio Burly III, PA-C      . senna-docusate (Senokot-S) tablet 2 tablet  2 tablet Oral QHS PRN Amin, Ankit Chirag, MD      . simvastatin (ZOCOR) tablet 20 mg  20 mg Oral q1800 Prudencio Burly III, PA-C   20 mg at 09/20/19 1726     Allergies:    Contrast media [iodinated diagnostic agents] and Iohexol   Social History:  The patient  reports that she quit smoking about 27 years ago. Her smoking use included cigarettes. She has a 60.00 pack-year smoking history. She has never used smokeless tobacco. She reports current alcohol use. She reports that she does not use drugs.   Family History:   family history includes Other in her father and mother.    Review of Systems: Review of Systems  Respiratory: Positive for shortness of breath.   Cardiovascular: Negative.   Gastrointestinal: Negative.   Musculoskeletal: Negative.   Neurological: Positive for weakness.  Psychiatric/Behavioral: Negative.   All other systems reviewed and are negative.    PHYSICAL EXAM: VS:  There were no vitals taken for this visit. , BMI There is no height or weight on file to calculate BMI. Constitutional:  oriented to person, place, and time. No distress. frail HENT:  Head: Normocephalic and atraumatic.  Eyes:  no discharge. No scleral icterus.  Neck: Normal range of motion. Neck supple. No JVD present.  Cardiovascular: 3/6 systolic ejection murmur heard right sternal border Pulmonary/Chest: Effort normal and breath sounds normal. No stridor. No respiratory distress.  no wheezes.  no rales.  no tenderness.  Abdominal: Soft.  no distension.  no tenderness.  Musculoskeletal: Normal range of motion.  no  tenderness or deformity.  Neurological:  normal muscle tone. Coordination normal. No atrophy Skin: Skin is warm and dry. No rash noted. not diaphoretic.  Psychiatric:  normal mood and affect. behavior is normal. Thought content normal.    Recent Labs: 03/25/2019: ALT 15; B Natriuretic Peptide 1,175.3 03/30/2019: Magnesium 1.8 09/20/2019: BUN 15; Creatinine, Ser 0.96; Hemoglobin 11.3; Platelets 209; Potassium 4.9; Sodium 139    Lipid Panel Lab Results  Component Value Date   CHOL 125 05/27/2017   HDL 54.70 05/27/2017   LDLCALC 60 05/27/2017    TRIG 52.0 05/27/2017      Wt Readings from Last 3 Encounters:  09/19/19 130 lb (59 kg)  08/10/19 136 lb 8 oz (61.9 kg)  06/30/19 165 lb (74.8 kg)     ASSESSMENT AND PLAN:   Atherosclerosis of native coronary artery of native heart with angina pectoris with documented spasm ([place]) - Plan: EKG 12-Lead Some shortness of breath, suspect secondary to severe aortic valve stenosis Recommended echocardiogram followed by cardiac catheterization in preparation for evaluation of  her aortic valve  Mixed hyperlipidemia Cholesterol is at goal on the current lipid regimen. No changes to the medications were made. Stable  Nonrheumatic aortic valve stenosis Aortic valve stenosis, mild to moderate in April 2016 Severe on echocardiogram last year Repeat echocardiogram ordered for further visualization and confirmation Recommended to family she would likely need cardiac catheterization following echocardiogram with follow-up in [facility] to discuss options for her aortic valve Suspect given mild memory issues and frail nature age 44 she would be a candidate for TAVR opefully this would help her shortness of breath which is her main complaint  Memory loss/dementia Symptoms seem stable, mild on today's visit  Chronic cough Long history of suspected postnasal drip been spitting into a cup Mild symptoms   COPD exacerbation (HCC) No recent COPD exacerbation Daughter reports her breathing has been stable  Long discussion with patient and daughter concerning aortic valve disease and need for further workup and procedures  Total encounter time more than 45 minutes  Greater than 50% was spent in counseling and coordination of care with the patient   Disposition:   F/U  3 months   No orders of the defined types were placed in this encounter.    Signed, Esmond Plants, M.D., Ph.D. 09/20/2019  [place], [place]

## 2019-09-20 NOTE — Plan of Care (Signed)

## 2019-09-20 NOTE — Evaluation (Signed)
Occupational Therapy Evaluation Patient Details Name: Jessica [name] MRN: [id number] DOB: 08-Oct-1926 Today's Date: 09/20/2019    History of Present Illness 85 yo admitted after recent fall with Rt hip fx s/p IM nail. PMhx: COPD, CAD, CHF, TAVR, anxiety   Clinical Impression   Pt with decline in function and safety with ADLs and ADL mobility with impaired strength, balance, endurance and cognition. Pt reports that PTA, she lives with her daughter and that she uses a RW for mobility and that she has assist from her daughter and niece with bathing, but that she could dress and toilet herself. Pt currently requires min guard A with UB ADLs, max A with LB ADLs, total A with toileting and min A with mobility using RW. Pt would benefit from acute OT services to address impairments to maximize level of function and safety    Follow Up Recommendations  Home health OT;Supervision/Assistance - 24 hour    Equipment Recommendations  3 in 1 bedside commode    Recommendations for Other Services       Precautions / Restrictions Precautions Precautions: Fall Restrictions Weight Bearing Restrictions: Yes RLE Weight Bearing: Weight bearing as tolerated      Mobility Bed Mobility Overal bed mobility: Needs Assistance Bed Mobility: Supine to Sit     Supine to sit: Min assist;HOB elevated     General bed mobility comments: pt in recliner upon arrival. Per PT note, pt is min A with bed mobility  Transfers Overall transfer level: Needs assistance   Transfers: Sit to/from Stand;Stand Pivot Transfers Sit to Stand: Min assist Stand pivot transfers: Min assist       General transfer comment: cues for correct hand placement    Balance Overall balance assessment: Needs assistance;History of Falls Sitting-balance support: Feet supported Sitting balance-Leahy Scale: Good     Standing balance support: Bilateral upper extremity supported;During functional activity Standing balance-Leahy  Scale: Poor                             ADL either performed or assessed with clinical judgement   ADL Overall ADL's : Needs assistance/impaired Eating/Feeding: Independent;Sitting   Grooming: Wash/dry hands;Wash/dry face;Min guard;Sitting   Upper Body Bathing: Min guard;Sitting   Lower Body Bathing: Maximal assistance   Upper Body Dressing : Min guard;Sitting   Lower Body Dressing: Maximal assistance   Toilet Transfer: Minimal assistance;Stand-pivot;BSC;Cueing for safety;Cueing for sequencing   Toileting- Clothing Manipulation and Hygiene: Total assistance;Sit to/from stand   Tub/ Shower Transfer: Minimal assistance;Stand-pivot;3 in 1;Rolling walker;Cueing for safety;Cueing for sequencing   Functional mobility during ADLs: Minimal assistance;Cueing for safety;Cueing for sequencing;Rolling walker       Vision Patient Visual Report: No change from baseline       Perception     Praxis      Pertinent Vitals/Pain Pain Assessment: No/denies pain     Hand Dominance Right   Extremity/Trunk Assessment Upper Extremity Assessment Upper Extremity Assessment: Generalized weakness   Lower Extremity Assessment Lower Extremity Assessment: Defer to PT evaluation   Cervical / Trunk Assessment Cervical / Trunk Assessment: Kyphotic   Communication Communication Communication: HOH   Cognition Arousal/Alertness: Awake/alert Behavior During Therapy: WFL for tasks assessed/performed Overall Cognitive Status: Impaired/Different from baseline Area of Impairment: Memory;Safety/judgement                     Memory: Decreased short-term memory   Safety/Judgement: Decreased awareness of safety;Decreased awareness of deficits  General Comments       Exercises     Shoulder Instructions      Home Living Family/patient expects to be discharged to:: Private residence Living Arrangements: Children Available Help at Discharge: Family;Available 24  hours/day Type of Home: House Home Access: Stairs to enter [employer] of Steps: 3   Home Layout: One level     Bathroom Shower/Tub: Occupational psychologist: Standard     Home Equipment: Environmental consultant - 2 wheels;Cane - single point;Shower seat          Prior Functioning/Environment Level of Independence: Needs assistance  Gait / Transfers Assistance Needed: Ambulatory with SPC. Does not drive. 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 Family assists with household tasks. Pt reports that she could dress and toilet herself            OT Problem List: Decreased strength;Impaired balance (sitting and/or standing);Decreased cognition;Pain;Decreased activity tolerance;Decreased knowledge of use of DME or AE      OT Treatment/Interventions: Self-care/ADL training;DME and/or AE instruction;Therapeutic activities;Therapeutic exercise;Balance training;Patient/family education    OT Goals(Current goals can be found in the care plan section) Acute Rehab OT Goals Patient Stated Goal: return home OT Goal Formulation: With patient Time For Goal Achievement: 10/04/19 Potential to Achieve Goals: Good ADL Goals Pt Will Perform Grooming: with set-up;with supervision;sitting Pt Will Perform Upper Body Bathing: with set-up;with supervision;sitting Pt Will Perform Lower Body Bathing: with mod assist;with caregiver independent in assisting Pt Will Perform Upper Body Dressing: with set-up;with supervision;sitting Pt Will Perform Lower Body Dressing: with mod assist;with caregiver independent in assisting Pt Will Transfer to Toilet: with min guard assist;ambulating;stand pivot transfer;regular height toilet;bedside commode;grab bars Pt Will Perform Toileting - Clothing Manipulation and hygiene: with max assist;with mod assist;sit to/from stand;with  caregiver independent in assisting Pt Will Perform Tub/Shower Transfer: with min guard assist;ambulating;Stand pivot transfer;shower seat;3 in 1;rolling walker  OT Frequency: Min 2X/week   Barriers to D/C:            Co-evaluation              AM-PAC OT "6 Clicks" Daily Activity     Outcome Measure Help from another person eating meals?: None Help from another person taking care of personal grooming?: A Little Help from another person toileting, which includes using toliet, bedpan, or urinal?: Total Help from another person bathing (including washing, rinsing, drying)?: A Lot Help from another person to put on and taking off regular upper body clothing?: A Little Help from another person to put on and taking off regular lower body clothing?: A Lot 6 Click Score: 15   End of Session Equipment Utilized During Treatment: Gait belt;Rolling walker;Other (comment)(BSC)  Activity Tolerance: Patient tolerated treatment well Patient left: in chair;with call bell/phone within reach;with chair alarm set  OT Visit Diagnosis: Unsteadiness on feet (R26.81);Other abnormalities of gait and mobility (R26.89);Muscle weakness (generalized) (M62.81);History of falling (Z91.81);Other symptoms and signs involving cognitive function                Time: [id number] OT Time Calculation (min): 25 min Charges:  OT General Charges $OT Visit: 1 Visit OT Evaluation $OT Eval Moderate Complexity: 1 Mod OT Treatments $Self Care/Home Management : 8-22 mins    Britt Bottom 09/20/2019, 12:10 PM

## 2019-09-20 NOTE — Care Management (Signed)
CM consult acknowledged to assist with any HH/DME needs. Awaiting PT/OT eval for DCP recommendations and will continue to follow.  Laylynn Campanella MSN, RN, NCM-BC, ACM-RN [phone] 

## 2019-09-20 NOTE — Progress Notes (Signed)
    Subjective: Patient reports pain as mild.   Tolerating diet.  Not yet mobilized.  Objective:   VITALS:   Vitals:   09/19/19 1804 09/19/19 1946 09/19/19 2003 09/20/19 0355  BP:  119/60  118/66  Pulse:  68  (!) 59  Resp:  14  14  Temp:  97.7 F (36.5 C)  (!) 97.5 F (36.4 C)  TempSrc:  Oral  Oral  SpO2:  91% 94% 99%  Weight: 59 kg     Height: 5\' 5"  (1.651 m)      CBC Latest Ref Rng & Units 09/20/2019 09/18/2019 09/17/2019  WBC 4.0 - 10.5 K/uL 6.9 4.7 8.2  Hemoglobin 12.0 - 15.0 g/dL 11.3(L) 12.8 13.0  Hematocrit 36.0 - 46.0 % 36.0 40.5 41.0  Platelets 150 - 400 K/uL 209 210 198   BMP Latest Ref Rng & Units 09/20/2019 09/18/2019 09/17/2019  Glucose 70 - 99 mg/dL 156(H) 147(H) 109(H)  BUN 8 - 23 mg/dL 15 15 12   Creatinine 0.44 - 1.00 mg/dL 0.96 0.71 0.83  BUN/Creat Ratio 12 - 28 - - -  Sodium 135 - 145 mmol/L 139 141 139  Potassium 3.5 - 5.1 mmol/L 4.9 4.3 3.6  Chloride 98 - 111 mmol/L 99 98 98  CO2 22 - 32 mmol/L 32 33(H) 31  Calcium 8.9 - 10.3 mg/dL 9.0 9.4 9.3   Intake/Output      11/23 0701 - 11/24 0700   P.O. 180   I.V. (mL/kg) 843.4 (14.3)   IV Piggyback 600   Total Intake(mL/kg) 1623.4 (27.5)   Urine (mL/kg/hr) 550 (0.4)   Blood 50   Total Output 600   Net +1023.4           Physical Exam: General: NAD.  Upright in bed.  Calm, hard of hearing, but conversant.  Vincent in place no increased work of breathing. MSK RLE: Neurovascularly intact Sensation intact distally Feet warm Dorsiflexion/Plantar flexion intact Incision: dressing C/D/I   Assessment: 1 Day Post-Op  S/P Procedure(s) (LRB): INTRAMEDULLARY (IM) NAIL INTERTROCHANTRIC (Right) by Dr. Ernesta Amble. Percell Miller on 09/19/2019  Principal Problem:   Acute right hip pain Active Problems:   Generalized anxiety disorder   COPD (chronic obstructive pulmonary disease) (HCC)   CAD (coronary artery disease)   Chronic diastolic CHF (congestive heart failure) (HCC)   Pressure injury of skin    Developing intertrochanteric insufficiency fracture, status post IM nail Doing well postop day 1 Tolerating some diet and voiding Pain controlled Not yet mobilized  Plan: Up with therapy Incentive Spirometry Apply ice PRN  Weightbearing: WBAT RLE Insicional and dressing care: Dressings left intact until follow-up Showering: Keep dressing dry VTE prophylaxis: Lovenox 40mg  qd, SCDs, ambulation Pain control: Minimize narcotics.  Continue current regimen. Follow - up plan: 2 weeks Contact information:  Edmonia Lynch MD, Roxan Hockey PA-C  Dispo: TBD.  Therapy evaluations pending.  Discharge when mobilized and ready medically.  Prudencio Burly III, PA-C 09/20/2019, 6:34 AM

## 2019-09-20 NOTE — Evaluation (Signed)
Physical Therapy Evaluation Patient Details Name: Jessica [name] MRN: [id number] DOB: 03/15/1926 Today's Date: 09/20/2019   History of Present Illness  85 yo admitted after recent fall with Rt hip fx s/p IM nail. PMhx: COPD, CAD, CHF, TAVR, anxiety  Clinical Impression  Pt very pleasant and HOH. Pt assisting to don glasses, teeth and wash face. Pt reports no pain and very willing to mobilize. 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 Pt with decreased strength, function, balance and gait who will benefit from acute therapy to maximize mobility, safety and function to decrease burden of care. SpO2 88% on RA and 94% on 2L    Follow Up Recommendations Home health PT;Supervision/Assistance - 24 hour    Equipment Recommendations  3in1 (PT)    Recommendations for Other Services OT consult     Precautions / Restrictions Precautions Precautions: Fall Restrictions RLE Weight Bearing: Weight bearing as tolerated      Mobility  Bed Mobility Overal bed mobility: Needs Assistance Bed Mobility: Supine to Sit     Supine to sit: Min assist;HOB elevated     General bed mobility comments: HOb 30 degrees with min assist to pivot to EOB and elevate trunk with increased time and multimodal cues  Transfers Overall transfer level: Needs assistance   Transfers: Sit to/from Stand;Stand Pivot Transfers Sit to Stand: Min assist Stand pivot transfers: Min assist       General transfer comment: min assist to stand from bed and from [facility]. Cues for hand placement and safety with assist to move and position RW. Stand pivot with RW bed to [facility]  Ambulation/Gait Ambulation/Gait assistance: Min assist Gait Distance (Feet): 30 Feet Assistive device: Rolling walker (2 wheeled) Gait Pattern/deviations: Step-through pattern;Decreased stride length;Trunk flexed   Gait velocity interpretation: <1.8 ft/sec, indicate of risk for recurrent falls General Gait Details: cues for  posture and position in RW, assist to direct RW, slow cadence  Stairs            Wheelchair Mobility    Modified Rankin (Stroke Patients Only)       Balance Overall balance assessment: Needs assistance;History of Falls   Sitting balance-Leahy Scale: Good     Standing balance support: Bilateral upper extremity supported Standing balance-Leahy Scale: Poor                               Pertinent Vitals/Pain Pain Assessment: No/denies pain    Home Living Family/patient expects to be discharged to:: Private residence Living Arrangements: Children Available Help at Discharge: Family;Available 24 hours/day Type of Home: House Home Access: Stairs to enter   [employer] of Steps: 3 Home Layout: One level Home Equipment: Walker - 2 wheels;Cane - single point;Shower seat      Prior Function Level of Independence: Needs assistance   Gait / Transfers Assistance Needed: Ambulatory with SPC. Does not drive. 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 Family assists with household tasks        Hand Dominance        Extremity/Trunk Assessment   Upper Extremity Assessment Upper Extremity Assessment: Generalized weakness    Lower Extremity Assessment Lower Extremity Assessment: Generalized weakness    Cervical / Trunk Assessment Cervical / Trunk Assessment: Kyphotic  Communication   Communication: HOH  Cognition Arousal/Alertness: Awake/alert Behavior  During Therapy: WFL for tasks assessed/performed Overall Cognitive Status: Impaired/Different from baseline Area of Impairment: Memory;Safety/judgement                     Memory: Decreased short-term memory   Safety/Judgement: Decreased awareness of safety;Decreased awareness of deficits            General Comments      Exercises      Assessment/Plan    PT Assessment Patient needs continued PT services  PT Problem List Decreased strength;Decreased mobility;Decreased safety awareness;Decreased activity tolerance;Decreased cognition;Decreased balance;Decreased knowledge of use of DME       PT Treatment Interventions Gait training;Balance training;Stair training;Cognitive remediation;Functional mobility training;Therapeutic activities;Patient/family education;DME instruction;Therapeutic exercise    PT Goals (Current goals can be found in the Care Plan section)  Acute Rehab PT Goals Patient Stated Goal: return home PT Goal Formulation: With patient Time For Goal Achievement: 10/04/19 Potential to Achieve Goals: Fair    Frequency Min 5X/week   Barriers to discharge        Co-evaluation               AM-PAC PT "6 Clicks" Mobility  Outcome Measure Help needed turning from your back to your side while in a flat bed without using bedrails?: A Little Help needed moving from lying on your back to sitting on the side of a flat bed without using bedrails?: A Little Help needed moving to and from a bed to a chair (including a wheelchair)?: A Little Help needed standing up from a chair using your arms (e.g., wheelchair or bedside chair)?: A Little Help needed to walk in hospital room?: A Little Help needed climbing 3-5 steps with a railing? : A Lot 6 Click Score: 17    End of Session Equipment Utilized During Treatment: Gait belt Activity Tolerance: Patient tolerated treatment well Patient left: in chair;with call bell/phone within reach;with chair alarm set Nurse Communication: Mobility status PT Visit Diagnosis: Other abnormalities of gait and mobility (R26.89);History of falling (Z91.81)    Time: [id number] PT Time Calculation (min) (ACUTE ONLY): 32 min   Charges:   PT Evaluation $PT Eval Moderate Complexity: 1 Mod PT Treatments $Therapeutic Activity: 8-22 mins        Micaila Ziemba P, PT Acute  Rehabilitation Services Pager: [phone] Office: [phone]   Lexus Barletta B Guinevere Stephenson 09/20/2019, 8:40 AM

## 2019-09-20 NOTE — Progress Notes (Signed)
PROGRESS NOTE    Jessica [name]  [id number] DOB: 1926-05-20 DOA: 09/17/2019 PCP: [name], [name], MD   Brief Narrative:   85 year old with history of severe AI status post replacement, COPD with chronic hypoxia, fibromyalgia, GAD presented with severe right-sided hip pain after a fall about 10 days prior to the admission.  CT was negative for acute fracture but MRI was suggestive of proximal right hip insufficiency fracture with small effusion, moderate osteoarthritis, bursitis.  Assessment & Plan:   Principal Problem:   Acute right hip pain Active Problems:   Generalized anxiety disorder   COPD (chronic obstructive pulmonary disease) (HCC)   CAD (coronary artery disease)   Chronic diastolic CHF (congestive heart failure) (HCC)   Pressure injury of skin  Acute right hip pain secondary to proximal right femur insufficiency fracture of the intertrochanteric region Right hip osteoarthritis and peritrochanter bursitis -Pain management, bowel regimen -Status post intramedullary nailing 09/19/2019 -Weightbearing as tolerated, dressing left intact until follow-up -Lovenox 40 mg daily for 4 weeks -PT/OT  Vitamin D deficiency -Vitamin D supplements added  COPD with chronic hypoxia -Currently appears to be stable.  Continue home bronchodilators  Coronary artery disease Chronic diastolic congestive heart failure, ejection fraction 60% -Currently chest pain-free.  Should be able to resume aspirin today  General anxiety disorder -Xanax Lexapro  DVT prophylaxis: Lovenox Code Status: Full code Family Communication: None Disposition Plan: Pending PT evaluation thereafter placement  Consultants:   Orthopedic  Procedures:   Right hip intramedullary nailing 11/23  Antimicrobials:   Perioperative   Subjective: Hard of hearing.  Sitting up in the chair attempting to eat her breakfast.  Denies any complaints at the moment.  Review of Systems Otherwise negative except  as per HPI, including: General: Denies fever, chills, night sweats or unintended weight loss. Resp: Denies cough, wheezing, shortness of breath. Cardiac: Denies chest pain, palpitations, orthopnea, paroxysmal nocturnal dyspnea. GI: Denies abdominal pain, nausea, vomiting, diarrhea or constipation GU: Denies dysuria, frequency, hesitancy or incontinence MS: Denies muscle aches, joint pain or swelling Neuro: Denies headache, neurologic deficits (focal weakness, numbness, tingling), abnormal gait Psych: Denies anxiety, depression, SI/HI/AVH Skin: Denies new rashes or lesions ID: Denies sick contacts, exotic exposures, travel  Objective: Vitals:   09/19/19 1804 09/19/19 1946 09/19/19 2003 09/20/19 0355  BP:  119/60  118/66  Pulse:  68  (!) 59  Resp:  14  14  Temp:  97.7 F (36.5 C)  (!) 97.5 F (36.4 C)  TempSrc:  Oral  Oral  SpO2:  91% 94% 99%  Weight: 59 kg     Height: 5\' 5"  (1.651 m)       Intake/Output Summary (Last 24 hours) at 09/20/2019 0744 Last data filed at 09/20/2019 0421 Gross per 24 hour  Intake 1623.42 ml  Output 600 ml  Net 1023.42 ml   Filed Weights   09/19/19 1804  Weight: 59 kg    Examination:  General exam: Appears calm and comfortable, elderly frail-appearing. Respiratory system: Minimal bibasilar rhonchi Cardiovascular system: S1 & S2 heard, RRR. No JVD, murmurs, rubs, gallops or clicks. No pedal edema. Gastrointestinal system: Abdomen is nondistended, soft and nontender. No organomegaly or masses felt. Normal bowel sounds heard. Central nervous system: Alert and oriented. No focal neurological deficits. Extremities: Symmetric 4 x 5 power. Skin: Right lower extremity hip dressing noted Psychiatry: Judgement and insight appear normal. Mood & affect appropriate.     Data Reviewed:   CBC: Recent Labs  Lab 09/17/19 1547 09/18/19 0507 09/20/19  0258  WBC 8.2 4.7 6.9  NEUTROABS 7.2 4.2  --   HGB 13.0 12.8 11.3*  HCT 41.0 40.5 36.0  MCV 103.0*  102.0* 103.4*  PLT 198 210 [id number]   Basic Metabolic Panel: Recent Labs  Lab 09/17/19 1547 09/18/19 0507 09/20/19 0258  NA 139 141 139  K 3.6 4.3 4.9  CL 98 98 99  CO2 31 33* 32  GLUCOSE 109* 147* 156*  BUN 12 15 15   CREATININE 0.83 0.71 0.96  CALCIUM 9.3 9.4 9.0   GFR: Estimated Creatinine Clearance: 34.3 mL/min (by C-G formula based on SCr of 0.96 mg/dL). Liver Function Tests: No results for input(s): AST, ALT, ALKPHOS, BILITOT, PROT, ALBUMIN in the last 168 hours. No results for input(s): LIPASE, AMYLASE in the last 168 hours. No results for input(s): AMMONIA in the last 168 hours. Coagulation Profile: Recent Labs  Lab 09/17/19 1547  INR 1.0   Cardiac Enzymes: No results for input(s): CKTOTAL, CKMB, CKMBINDEX, TROPONINI in the last 168 hours. BNP (last 3 results) No results for input(s): PROBNP in the last 8760 hours. HbA1C: No results for input(s): HGBA1C in the last 72 hours. CBG: No results for input(s): GLUCAP in the last 168 hours. Lipid Profile: No results for input(s): CHOL, HDL, LDLCALC, TRIG, CHOLHDL, LDLDIRECT in the last 72 hours. Thyroid Function Tests: No results for input(s): TSH, T4TOTAL, FREET4, T3FREE, THYROIDAB in the last 72 hours. Anemia Panel: No results for input(s): VITAMINB12, FOLATE, FERRITIN, TIBC, IRON, RETICCTPCT in the last 72 hours. Sepsis Labs: No results for input(s): PROCALCITON, LATICACIDVEN in the last 168 hours.  Recent Results (from the past 240 hour(s))  SARS CORONAVIRUS 2 (TAT 6-24 HRS) Nasopharyngeal Nasopharyngeal Swab     Status: None   Collection Time: 09/17/19  7:19 PM   Specimen: Nasopharyngeal Swab  Result Value Ref Range Status   SARS Coronavirus 2 NEGATIVE NEGATIVE Final    Comment: (NOTE) SARS-CoV-2 target nucleic acids are NOT DETECTED. The SARS-CoV-2 RNA is generally detectable in upper and lower respiratory specimens during the acute phase of infection. Negative results do not preclude SARS-CoV-2 infection, do  not rule out co-infections with other pathogens, and should not be used as the sole basis for treatment or other patient management decisions. Negative results must be combined with clinical observations, patient history, and epidemiological information. The expected result is Negative. Fact Sheet for Patients: SugarRoll.be Fact Sheet for Healthcare Providers: https://www.woods-mathews.com/ This test is not yet approved or cleared by the [place] FDA and  has been authorized for detection and/or diagnosis of SARS-CoV-2 by FDA under an Emergency Use Authorization (EUA). This EUA will remain  in effect (meaning this test can be used) for the duration of the COVID-19 declaration under Section 56 4(b)(1) of the Act, 21 U.S.C. section 360bbb-3(b)(1), unless the authorization is terminated or revoked sooner. Performed at [name], [place] [address], [place], [place] [address]   Surgical pcr screen     Status: None   Collection Time: 09/18/19  2:37 PM   Specimen: Nasal Mucosa; Nasal Swab  Result Value Ref Range Status   MRSA, PCR NEGATIVE NEGATIVE Final   Staphylococcus aureus NEGATIVE NEGATIVE Final    Comment: (NOTE) The Xpert SA Assay (FDA approved for NASAL specimens in patients 84 years of age and older), is one component of a comprehensive surveillance program. It is not intended to diagnose infection nor to guide or monitor treatment. Performed at [name], [place] [address], [place], [name]  [address]          Radiology Studies: Mr Hip Right Wo Contrast  Result Date: 09/18/2019 CLINICAL DATA:  Severe right hip pain EXAM: MR OF THE RIGHT HIP WITHOUT CONTRAST TECHNIQUE: Multiplanar, multisequence MR imaging was performed. No intravenous contrast was administered. COMPARISON:  CT 09/17/2019 FINDINGS: Bones: Within the intertrochanteric aspect of the proximal right femur is irregular T1 hypointense signal which  predominates near the base of the lesser trochanter (series 4, images 23-25). There is mild associated marrow edema within this region. No displaced fracture component. Right hip joint is intact without dislocation. Mild diffuse marrow heterogeneity without discrete marrow replacing lesion. Findings are nonspecific but can be seen in the setting of chronic anemia, smoking, and/or obesity. Articular cartilage and labrum Articular cartilage: Moderate diffuse cartilage thinning and surface irregularity. Labrum:  Diffuse degeneration. Joint or bursal effusion Joint effusion:  Small joint effusion with synovitis. Bursae: Small amount of iliopsoas bursal fluid. There is small peritrochanteric bursal fluid and edema. Muscles and tendons Muscles and tendons: Tendinosis with low-grade partial tearing of the insertional fibers of the gluteus minimus tendon. Bilateral hamstring tendinosis. Intramuscular edema about the right hip particularly involving the vastus lateralis and vastus intermedius muscles as well as the abductor muscle group. Other findings Miscellaneous:   No soft tissue hematoma. Colonic diverticulosis. IMPRESSION: 1. Irregular area low T1 signal intensity and associated mild marrow edema within the intertrochanteric region of the proximal right femur suggestive of a developing insufficiency fracture. 2. Small joint effusion with synovitis. 3. Moderate osteoarthritis of the right hip joint. 4. Mild iliopsoas and peritrochanteric bursitis. 5. Mild diffuse marrow heterogeneity without discrete marrow replacing lesion. Findings are nonspecific but can be seen in the setting of chronic anemia, smoking, and/or obesity. Electronically Signed   By: Davina Poke M.D.   On: 09/18/2019 09:17   Dg C-arm 1-60 Min  Result Date: 09/19/2019 CLINICAL DATA:  Insufficiency fracture of the proximal right femur. EXAM: RIGHT FEMUR 2 VIEWS; DG C-ARM 1-60 MIN COMPARISON:  MRI dated 09/18/2019 FINDINGS: Multiple C-arm images  demonstrate the patient has undergone insertion of intramedullary nail and lag screw in the proximal right femur. IMPRESSION: Open reduction and internal fixation of the proximal right femur. FLUOROSCOPY TIME:  49 seconds C-arm fluoroscopic images were obtained intraoperatively and submitted for post operative interpretation. Electronically Signed   By: Lorriane Shire M.D.   On: 09/19/2019 20:52   Dg Femur, Min 2 Views Right  Result Date: 09/19/2019 CLINICAL DATA:  Insufficiency fracture of the proximal right femur. EXAM: RIGHT FEMUR 2 VIEWS; DG C-ARM 1-60 MIN COMPARISON:  MRI dated 09/18/2019 FINDINGS: Multiple C-arm images demonstrate the patient has undergone insertion of intramedullary nail and lag screw in the proximal right femur. IMPRESSION: Open reduction and internal fixation of the proximal right femur. FLUOROSCOPY TIME:  49 seconds C-arm fluoroscopic images were obtained intraoperatively and submitted for post operative interpretation. Electronically Signed   By: Lorriane Shire M.D.   On: 09/19/2019 20:52        Scheduled Meds:  acetaminophen  500 mg Oral Q6H   ALPRAZolam  0.5 mg Oral QHS   arformoterol  15 mcg Nebulization BID   docusate sodium  100 mg Oral BID   enoxaparin (LOVENOX) injection  40 mg Subcutaneous Q24H   escitalopram  20 mg Oral q morning - 10a   lidocaine  1 patch Transdermal Q24H   simvastatin  20 mg Oral q1800   Continuous Infusions:  lactated ringers 50 mL/hr at  09/20/19 0300     LOS: 2 days   Time spent=25 mins    Leonardo Makris Arsenio Loader, MD [facility]  If 7PM-7AM, please contact night-coverage  09/20/2019, 7:44 AM

## 2019-09-21 ENCOUNTER — Ambulatory Visit: Payer: Medicare Other | Admitting: Cardiovascular Disease

## 2019-09-21 DIAGNOSIS — F411 Generalized anxiety disorder: Secondary | ICD-10-CM

## 2019-09-21 DIAGNOSIS — I2510 Atherosclerotic heart disease of native coronary artery without angina pectoris: Secondary | ICD-10-CM

## 2019-09-21 DIAGNOSIS — S72001A Fracture of unspecified part of neck of right femur, initial encounter for closed fracture: Secondary | ICD-10-CM

## 2019-09-21 DIAGNOSIS — J449 Chronic obstructive pulmonary disease, unspecified: Secondary | ICD-10-CM

## 2019-09-21 DIAGNOSIS — I5032 Chronic diastolic (congestive) heart failure: Secondary | ICD-10-CM

## 2019-09-21 LAB — COMPREHENSIVE METABOLIC PANEL
ALT: 8 U/L (ref 0–44)
AST: 19 U/L (ref 15–41)
Albumin: 2.5 g/dL — ABNORMAL LOW (ref 3.5–5.0)
Alkaline Phosphatase: 88 U/L (ref 38–126)
Anion gap: 8 (ref 5–15)
BUN: 19 mg/dL (ref 8–23)
CO2: 32 mmol/L (ref 22–32)
Calcium: 9 mg/dL (ref 8.9–10.3)
Chloride: 100 mmol/L (ref 98–111)
Creatinine, Ser: 1.03 mg/dL — ABNORMAL HIGH (ref 0.44–1.00)
GFR calc Af Amer: 55 mL/min — ABNORMAL LOW (ref >60)
GFR calc non Af Amer: 47 mL/min — ABNORMAL LOW (ref >60)
Glucose, Bld: 92 mg/dL (ref 70–99)
Potassium: 4.1 mmol/L (ref 3.5–5.1)
Sodium: 140 mmol/L (ref 135–145)
Total Bilirubin: 0.9 mg/dL (ref 0.3–1.2)
Total Protein: 5.4 g/dL — ABNORMAL LOW (ref 6.5–8.1)

## 2019-09-21 LAB — MAGNESIUM: Magnesium: 1.6 mg/dL — ABNORMAL LOW (ref 1.7–2.4)

## 2019-09-21 MED ORDER — ACETAMINOPHEN 500 MG PO TABS: 1000.0000 mg | ORAL_TABLET | Freq: Three times a day (TID) | ORAL | 0 refills | Status: DC | PRN

## 2019-09-21 MED ORDER — VITAMIN D (ERGOCALCIFEROL) 1.25 MG (50000 UNIT) PO CAPS: 50000.0000 [IU] | ORAL_CAPSULE | ORAL | 0 refills | Status: DC

## 2019-09-21 MED ORDER — POLYETHYLENE GLYCOL 3350 17 G PO PACK
17.0000 g | PACK | Freq: Two times a day (BID) | ORAL | Status: DC | PRN
  Administered 2019-09-21 – 2019-09-24 (×4): 17 g via ORAL
  Filled 2019-09-21 (×4): qty 1

## 2019-09-21 MED ORDER — SENNOSIDES-DOCUSATE SODIUM 8.6-50 MG PO TABS: 2.0000 | ORAL_TABLET | Freq: Two times a day (BID) | ORAL | 0 refills | Status: DC | PRN

## 2019-09-21 MED ORDER — MAGNESIUM SULFATE 2 GM/50ML IV SOLN
2.0000 g | Freq: Once | INTRAVENOUS | Status: AC
  Administered 2019-09-21: 2 g via INTRAVENOUS
  Filled 2019-09-21: qty 50

## 2019-09-21 MED ORDER — POLYETHYLENE GLYCOL 3350 17 G PO PACK: 17.0000 g | PACK | Freq: Every day | ORAL | 0 refills | Status: DC | PRN

## 2019-09-21 MED ORDER — VITAMIN D (ERGOCALCIFEROL) 1.25 MG (50000 UNIT) PO CAPS
50000.0000 [IU] | ORAL_CAPSULE | ORAL | Status: DC
  Administered 2019-09-21: 11:00:00 50000 [IU] via ORAL
  Filled 2019-09-21: qty 1

## 2019-09-21 MED ORDER — SENNOSIDES-DOCUSATE SODIUM 8.6-50 MG PO TABS
2.0000 | ORAL_TABLET | Freq: Two times a day (BID) | ORAL | Status: DC | PRN
  Administered 2019-09-21 – 2019-09-23 (×3): 2 via ORAL
  Filled 2019-09-21 (×3): qty 2

## 2019-09-21 MED ORDER — CALCIUM CARBONATE-VITAMIN D 500-200 MG-UNIT PO TABS: 2.0000 | ORAL_TABLET | Freq: Every day | ORAL | 0 refills | Status: DC

## 2019-09-21 MED ORDER — CALCIUM CARBONATE-VITAMIN D 500-200 MG-UNIT PO TABS
2.0000 | ORAL_TABLET | Freq: Every day | ORAL | Status: DC
  Administered 2019-09-21 – 2019-09-26 (×6): 2 via ORAL
  Filled 2019-09-21 (×6): qty 2

## 2019-09-21 NOTE — Progress Notes (Signed)
PROGRESS NOTE  Jessica [name] [id number] DOB: Jan 05, 1926   PCP: [name], [name], MD  Patient is from: Home.  Lives with her daughter.  DOA: 09/17/2019 LOS: 3  Brief Narrative / Interim history: 85 year old with history of severe AI status post replacement, COPD with chronic hypoxia, fibromyalgia, GAD presented with severe right-sided hip pain after a fall about 10 days prior to the admission.  CT was negative for acute fracture but MRI was suggestive of proximal right hip insufficiency fracture with small effusion, moderate osteoarthritis, bursitis.  She underwent intramedullary nailing on 09/19/2019.  Evaluated by PT/OT who recommended home health with 24-hour supervision.  However, family not able to provide 24-hour supervision.  CSW consulted for SNF.  Subjective: No major events overnight of this morning.  No complaint this morning.  Pain fairly controlled.  She denies chest pain, dyspnea, GI or GU symptoms.  She is oriented to self, place and day.   Objective: Vitals:   09/20/19 2049 09/20/19 2054 09/21/19 0451 09/21/19 0801  BP:  (!) 104/44 (!) 148/76 (!) 162/59  Pulse:  78 66 64  Resp:   18 18  Temp:  98.3 F (36.8 C) (!) 97.3 F (36.3 C) 98.2 F (36.8 C)  TempSrc:  Oral Oral Oral  SpO2: 94% 94% 95% 95%  Weight:      Height:        Intake/Output Summary (Last 24 hours) at 09/21/2019 1143 Last data filed at 09/21/2019 0900 Gross per 24 hour  Intake 1000 ml  Output 1100 ml  Net -100 ml   Filed Weights   09/19/19 1804  Weight: 59 kg    Examination:  GENERAL: No acute distress.  Appears well.  HEENT: MMM.  Vision and hearing grossly intact.  NECK: Supple.  No apparent JVD.  RESP:  No IWOB. Good air movement bilaterally. CVS:  RRR. Heart sounds normal.  ABD/GI/GU: Bowel sounds present. Soft. Non tender.  MSK/EXT:  No apparent deformity or edema.  Dressing over RLE hip DCI. SKIN: no apparent skin lesion or wound NEURO: Awake, alert and oriented  appropriately.  No gross deficit.  PSYCH: Calm. Normal affect.   Assessment & Plan: Acute right hip pain secondary to proximal right femoral intertrochanteric insufficiency fracture Right hip osteoarthritis and peritrochanteric bursitis Vitamin D deficiency -Underwent intramedullary nailing on 09/19/2019 by Dr. Percell Miller -Pain management with bowel regimen -Vitamin D 50,000 units weekly along with daily calcium/vitamin D -Per Ortho, WBAT, dressing until follow-up and subcu Lovenox for 4 weeks -PT/OT  Chronic COPD: Stable -Continue home bronchodilators  History of CAD/AI s/p AVR/chronic diastolic CHF: No cardiopulmonary symptoms. -Continue home statin and aspirin. -Home Lasix on hold. -Monitor fluid status  GAD/insomnia/fibromyalgia: Stable -Continue home Xanax and Lexapro  Pressure Injury 09/18/19 Coccyx Mid;Lower Stage I -  Intact skin with non-blanchable redness of a localized area usually over a bony prominence. (Active)  09/18/19 0940  Location: Coccyx  Location Orientation: Mid;Lower  Staging: Stage I -  Intact skin with non-blanchable redness of a localized area usually over a bony prominence.  Wound Description (Comments):   Present on Admission:                DVT prophylaxis: Subcu Lovenox Code Status: Full code Family Communication: Updated patient's daughter over the phone. Disposition Plan: Remains inpatient.  Final disposition SNF Consultants: Orthopedic surgery  Procedures:  11/23-intramedullary nailing of right hip by Dr. Percell Miller  Microbiology summarized: COVID-19 screen negative MRSA/staph PCR negative  Sch Meds:  Scheduled Meds: .  ALPRAZolam  0.5 mg Oral QHS  . arformoterol  15 mcg Nebulization BID  . aspirin EC  81 mg Oral Daily  . budesonide  0.5 mg Nebulization BID  . calcium-vitamin D  2 tablet Oral Q breakfast  . docusate sodium  100 mg Oral BID  . enoxaparin (LOVENOX) injection  40 mg Subcutaneous Q24H  . escitalopram  20 mg Oral q  morning - 10a  . lidocaine  1 patch Transdermal Q24H  . loratadine  10 mg Oral Daily  . pantoprazole  40 mg Oral Q1200  . simvastatin  20 mg Oral q1800  . Vitamin D (Ergocalciferol)  50,000 Units Oral Q7 days   Continuous Infusions: . lactated ringers 50 mL/hr at 09/20/19 0300   PRN Meds:.acetaminophen, albuterol, HYDROcodone-acetaminophen, morphine injection, ondansetron **OR** ondansetron (ZOFRAN) IV, polyethylene glycol, senna-docusate  Antimicrobials: Anti-infectives (From admission, onward)   Start     Dose/Rate Route Frequency Ordered Stop   09/19/19 2000  ceFAZolin (ANCEF) IVPB 2g/100 mL premix     2 g 200 mL/hr over 30 Minutes Intravenous Every 6 hours 09/19/19 1713 09/20/19 0237   09/19/19 0600  ceFAZolin (ANCEF) IVPB 2g/100 mL premix     2 g 200 mL/hr over 30 Minutes Intravenous To [facility] 09/18/19 1925 09/19/19 1351       I have personally reviewed the following labs and images: CBC: Recent Labs  Lab 09/17/19 1547 09/18/19 0507 09/20/19 0258  WBC 8.2 4.7 6.9  NEUTROABS 7.2 4.2  --   HGB 13.0 12.8 11.3*  HCT 41.0 40.5 36.0  MCV 103.0* 102.0* 103.4*  PLT 198 210 209   BMP &GFR Recent Labs  Lab 09/17/19 1547 09/18/19 0507 09/20/19 0258 09/21/19 0308  NA 139 141 139 140  K 3.6 4.3 4.9 4.1  CL 98 98 99 100  CO2 31 33* 32 32  GLUCOSE 109* 147* 156* 92  BUN 12 15 15 19   CREATININE 0.83 0.71 0.96 1.03*  CALCIUM 9.3 9.4 9.0 9.0  MG  --   --   --  1.6*   Estimated Creatinine Clearance: 32 mL/min (A) (by C-G formula based on SCr of 1.03 mg/dL (H)). Liver & Pancreas: Recent Labs  Lab 09/21/19 0308  AST 19  ALT 8  ALKPHOS 88  BILITOT 0.9  PROT 5.4*  ALBUMIN 2.5*   No results for input(s): LIPASE, AMYLASE in the last 168 hours. No results for input(s): AMMONIA in the last 168 hours. Diabetic: No results for input(s): HGBA1C in the last 72 hours. No results for input(s): GLUCAP in the last 168 hours. Cardiac Enzymes: No results for  input(s): CKTOTAL, CKMB, CKMBINDEX, TROPONINI in the last 168 hours. No results for input(s): PROBNP in the last 8760 hours. Coagulation Profile: Recent Labs  Lab 09/17/19 1547  INR 1.0   Thyroid Function Tests: No results for input(s): TSH, T4TOTAL, FREET4, T3FREE, THYROIDAB in the last 72 hours. Lipid Profile: No results for input(s): CHOL, HDL, LDLCALC, TRIG, CHOLHDL, LDLDIRECT in the last 72 hours. Anemia Panel: No results for input(s): VITAMINB12, FOLATE, FERRITIN, TIBC, IRON, RETICCTPCT in the last 72 hours. Urine analysis:    Component Value Date/Time   COLORURINE YELLOW 03/25/2019 1007   APPEARANCEUR CLEAR 03/25/2019 1007   LABSPEC 1.012 03/25/2019 1007   PHURINE 6.0 03/25/2019 1007   GLUCOSEU NEGATIVE 03/25/2019 1007   GLUCOSEU NEGATIVE 07/06/2009 1057   HGBUR NEGATIVE 03/25/2019 [place] 03/25/2019 [place] 03/25/2019 1007   PROTEINUR NEGATIVE  03/25/2019 1007   UROBILINOGEN 0.2 07/06/2009 1057   NITRITE NEGATIVE 03/25/2019 1007   LEUKOCYTESUR NEGATIVE 03/25/2019 1007   Sepsis Labs: Invalid input(s): PROCALCITONIN, [place]  Microbiology: Recent Results (from the past 240 hour(s))  SARS CORONAVIRUS 2 (TAT 6-24 HRS) Nasopharyngeal Nasopharyngeal Swab     Status: None   Collection Time: 09/17/19  7:19 PM   Specimen: Nasopharyngeal Swab  Result Value Ref Range Status   SARS Coronavirus 2 NEGATIVE NEGATIVE Final    Comment: (NOTE) SARS-CoV-2 target nucleic acids are NOT DETECTED. The SARS-CoV-2 RNA is generally detectable in upper and lower respiratory specimens during the acute phase of infection. Negative results do not preclude SARS-CoV-2 infection, do not rule out co-infections with other pathogens, and should not be used as the sole basis for treatment or other patient management decisions. Negative results must be combined with clinical observations, patient history, and epidemiological information. The expected  result is Negative. Fact Sheet for Patients: SugarRoll.be Fact Sheet for Healthcare Providers: https://www.woods-mathews.com/ This test is not yet approved or cleared by the [place] FDA and  has been authorized for detection and/or diagnosis of SARS-CoV-2 by FDA under an Emergency Use Authorization (EUA). This EUA will remain  in effect (meaning this test can be used) for the duration of the COVID-19 declaration under Section 56 4(b)(1) of the Act, 21 U.S.C. section 360bbb-3(b)(1), unless the authorization is terminated or revoked sooner. Performed at [facility], [place] [address] [place], [place] [address]   Surgical pcr screen     Status: None   Collection Time: 09/18/19  2:37 PM   Specimen: Nasal Mucosa; Nasal Swab  Result Value Ref Range Status   MRSA, PCR NEGATIVE NEGATIVE Final   Staphylococcus aureus NEGATIVE NEGATIVE Final    Comment: (NOTE) The Xpert SA Assay (FDA approved for NASAL specimens in patients 30 years of age and older), is one component of a comprehensive surveillance program. It is not intended to diagnose infection nor to guide or monitor treatment. Performed at [facility], [place] [address], [place], [place] [address]     Radiology Studies: No results found.   Santiana Glidden T. Barnes  If 7PM-7AM, please contact night-coverage [email or web address] Password [facility] 09/21/2019, 11:43 AM

## 2019-09-21 NOTE — Progress Notes (Signed)
Patient continues to complain of upper left arm pain. States that it hurts more than her hip. No bruising noted. Darkened area with scabs noted on skin. No chest pain. States she has had this prior to this admission.

## 2019-09-21 NOTE — Progress Notes (Signed)
Physical Therapy Treatment Patient Details Name: Jessica [name] MRN: [id number] DOB: 19-May-1926 Today's Date: 09/21/2019    History of Present Illness 85 yo admitted after recent fall with Rt hip fx s/p IM nail. PMhx: COPD, CAD, CHF, TAVR, anxiety    PT Comments    Continuing work on functional mobility and activity tolerance;  Jessica [name] is moving well, needing min assist for bed mobility, transfers, and  Short distance ambulation with RW; She did desat to 87% performing therex on room air, so we opted to ambulate while supported on supplemental O2; Will consider O2 sat qualifying walk next session;   Noted in Dr. Juliann Pares note that family is unable to provide 24 hour assist, and without 24 hour assist, I agree with post-acute rehab to maximize independence and safety with mobility  -- I saw she is a [facility] client: is the [place] [facility] an option for her? My understanding is that this program can start off with t or near 24 hour assistance, and wean as she progresses functionally   Follow Up Recommendations  Home health PT;Supervision/Assistance - 24 hour  [facility]? SNF for post-acute rehab if 24 hour assist is unable to be arranged     Equipment Recommendations  3in1 (PT)    Recommendations for Other Services OT consult     Precautions / Restrictions Precautions Precautions: Fall Restrictions RLE Weight Bearing: Weight bearing as tolerated    Mobility  Bed Mobility Overal bed mobility: Needs Assistance Bed Mobility: Supine to Sit     Supine to sit: HOB elevated;Min assist     General bed mobility comments: Very nice initiation, and movement toward EOBmin handheld assist to pull to sit  Transfers Overall transfer level: Needs assistance Equipment used: Rolling walker (2 wheeled) Transfers: Sit to/from Stand Sit to Stand: Min assist         General transfer comment: cues for correct hand placement  Ambulation/Gait Ambulation/Gait  assistance: Min guard Gait Distance (Feet): 20 Feet(to the bathroom) Assistive device: Rolling walker (2 wheeled) Gait Pattern/deviations: Step-through pattern;Decreased stride length;Trunk flexed     General Gait Details: cues for posture and position in RW, assist to direct RW, slow cadence   Stairs             Wheelchair Mobility    Modified Rankin (Stroke Patients Only)       Balance Overall balance assessment: Needs assistance;History of Falls   Sitting balance-Leahy Scale: Good     Standing balance support: Bilateral upper extremity supported;During functional activity Standing balance-Leahy Scale: Poor(approaching Fair)                              Cognition Arousal/Alertness: Awake/alert Behavior During Therapy: WFL for tasks assessed/performed Overall Cognitive Status: Impaired/Different from baseline Area of Impairment: Memory;Safety/judgement                     Memory: Decreased short-term memory   Safety/Judgement: Decreased awareness of safety;Decreased awareness of deficits     General Comments: Improving awareness      Exercises Total Joint Exercises Ankle Circles/Pumps: AROM;Both;20 reps Quad Sets: AROM;Right;10 reps Gluteal Sets: AROM;Both;10 reps Towel Squeeze: AROM;Both;10 reps Short Arc Quad: AROM;Right;10 reps Heel Slides: AROM;Right;10 reps Hip ABduction/ADduction: AROM;Right;10 reps    General Comments General comments (skin integrity, edema, etc.): O2 sats decr to 87% with therapeutic exercise on room air, so opted to walk with supplemental O2  Pertinent Vitals/Pain Pain Assessment: No/denies pain    Home Living                      Prior Function            PT Goals (current goals can now be found in the care plan section) Acute Rehab PT Goals Patient Stated Goal: return home PT Goal Formulation: With patient Time For Goal Achievement: 10/04/19 Potential to Achieve Goals:  Good Progress towards PT goals: Progressing toward goals    Frequency    Min 5X/week      PT Plan Current plan remains appropriate    Co-evaluation              AM-PAC PT "6 Clicks" Mobility   Outcome Measure  Help needed turning from your back to your side while in a flat bed without using bedrails?: A Little Help needed moving from lying on your back to sitting on the side of a flat bed without using bedrails?: A Little Help needed moving to and from a bed to a chair (including a wheelchair)?: A Little Help needed standing up from a chair using your arms (e.g., wheelchair or bedside chair)?: A Little Help needed to walk in hospital room?: A Little Help needed climbing 3-5 steps with a railing? : A Lot 6 Click Score: 17    End of Session Equipment Utilized During Treatment: Gait belt Activity Tolerance: Patient tolerated treatment well Patient left: with call bell/phone within reach;Other (comment)(In bathroom, trying to move bowels) Nurse Communication: Mobility status PT Visit Diagnosis: Other abnormalities of gait and mobility (R26.89);History of falling (Z91.81)     Time: [id number] PT Time Calculation (min) (ACUTE ONLY): 15 min  Charges:  $Therapeutic Activity: 8-22 mins                     Roney Marion, PT  Acute Rehabilitation Services Pager [phone] Office [place] 09/21/2019, 1:24 PM

## 2019-09-21 NOTE — Progress Notes (Addendum)
    Subjective: Patient reports pain as mild.   Tolerating diet.  Early mobilization w/ therapy going okay.  Objective:   VITALS:   Vitals:   09/20/19 2049 09/20/19 2054 09/21/19 0451 09/21/19 0801  BP:  (!) 104/44 (!) 148/76 (!) 162/59  Pulse:  78 66 64  Resp:   18 18  Temp:  98.3 F (36.8 C) (!) 97.3 F (36.3 C) 98.2 F (36.8 C)  TempSrc:  Oral Oral Oral  SpO2: 94% 94% 95% 95%  Weight:      Height:       CBC Latest Ref Rng & Units 09/20/2019 09/18/2019 09/17/2019  WBC 4.0 - 10.5 K/uL 6.9 4.7 8.2  Hemoglobin 12.0 - 15.0 g/dL 11.3(L) 12.8 13.0  Hematocrit 36.0 - 46.0 % 36.0 40.5 41.0  Platelets 150 - 400 K/uL 209 210 198   BMP Latest Ref Rng & Units 09/21/2019 09/20/2019 09/18/2019  Glucose 70 - 99 mg/dL 92 156(H) 147(H)  BUN 8 - 23 mg/dL 19 15 15   Creatinine 0.44 - 1.00 mg/dL 1.03(H) 0.96 0.71  BUN/Creat Ratio 12 - 28 - - -  Sodium 135 - 145 mmol/L 140 139 141  Potassium 3.5 - 5.1 mmol/L 4.1 4.9 4.3  Chloride 98 - 111 mmol/L 100 99 98  CO2 22 - 32 mmol/L 32 32 33(H)  Calcium 8.9 - 10.3 mg/dL 9.0 9.0 9.4   Intake/Output      11/24 0701 - 11/25 0700 11/25 0701 - 11/26 0700   P.O. 880 240   I.V. (mL/kg)     IV Piggyback     Total Intake(mL/kg) 880 (14.9) 240 (4.1)   Urine (mL/kg/hr) 700 (0.5) 400 (1)   Blood     Total Output 700 400   Net +180 -160        Urine Occurrence 2 x    Stool Occurrence 1 x        Physical Exam: General: NAD.  Upright in chair.  Calm, hard of hearing, but conversant. No increased work of breathing. MSK RLE: Neurovascularly intact Sensation intact distally Feet warm Dorsiflexion/Plantar flexion intact Incision: dressing C/D/I   Assessment: 2 Days Post-Op  S/P Procedure(s) (LRB): INTRAMEDULLARY (IM) NAIL INTERTROCHANTRIC (Right) by Dr. Ernesta Amble. Percell Miller on 09/19/2019  Principal Problem:   Acute right hip pain Active Problems:   Generalized anxiety disorder   COPD (chronic obstructive pulmonary disease) (HCC)   CAD  (coronary artery disease)   Chronic diastolic CHF (congestive heart failure) (HCC)   Pressure injury of skin   Developing intertrochanteric insufficiency fracture, status post IM nail Doing well postop day 2 Stable from an orthopedic perspective Tolerating diet and voiding Pain controlled Early mobilization w/ therapy - HHPT recommended.  Plan: Up with therapy Incentive Spirometry Apply ice PRN  Weightbearing: WBAT RLE Insicional and dressing care: Dressings left intact until follow-up Showering: Keep dressing dry VTE prophylaxis: Lovenox 40mg  qd, SCDs, ambulation Pain control: Minimize narcotics.  Continue current regimen. Follow - up plan: 2 weeks Contact information:  Edmonia Lynch MD, Roxan Hockey PA-C  Dispo:  Therapy evaluations ongoing - HHPT recommended.  Discharge when mobilized and ready medically.  Please call w/ questions  Prudencio Burly III, PA-C 09/21/2019, 1:35 PM

## 2019-09-21 NOTE — Plan of Care (Addendum)
Pt had discharge orders placed and then discontinued this AM. Cyndia Skeeters MD stated SNF for discharge in their note and Martensen MD stated [facility]. CSW Lanelle Bal and I both attempted to reach the sister in order to figure out what discharge plans need to be according to what help she may or may not have at home. I was able to reach the sister but CSW could not. From what I heard from the family member, sounds like home is not an option for the pt. They requested SNF or rehab. She also stated that her mobile cell listed in Epic be used, not the home or work number. Relayed this to [place] and she stated they would follow up tomorrow. Will continue to monitor.   Problem: Clinical Measurements: Goal: Will remain free from infection Outcome: Progressing   Problem: Elimination: Goal: Will not experience complications related to bowel motility Outcome: Progressing   Problem: Pain Managment: Goal: General experience of comfort will improve Outcome: Progressing   Problem: Safety: Goal: Ability to remain free from injury will improve Outcome: Progressing   Problem: Skin Integrity: Goal: Risk for impaired skin integrity will decrease Outcome: Progressing   Problem: Clinical Measurements: Goal: Will remain free from infection Outcome: Progressing   Problem: Elimination: Goal: Will not experience complications related to bowel motility Outcome: Progressing   Problem: Pain Managment: Goal: General experience of comfort will improve Outcome: Progressing   Problem: Safety: Goal: Ability to remain free from injury will improve Outcome: Progressing   Problem: Skin Integrity: Goal: Risk for impaired skin integrity will decrease Outcome: Progressing

## 2019-09-21 NOTE — NC FL2 (Signed)
[place] LEVEL OF CARE SCREENING TOOL     IDENTIFICATION  Patient Name: Jessica [name] Birthdate: [birth year] Sex: female Admission Date (Current Location): 09/17/2019  [facility] and [place] Number:  Herbalist and Address:  The [place]. [facility], [place] [address] [place], [place] [address]      Provider Number: [id number]  Attending Physician Name and Address:  Mercy Riding, MD  Relative Name and Phone Number:  Erin Hearing [phone]    Current Level of Care: Hospital Recommended Level of Care: [place] Prior Approval Number:    Date Approved/Denied:   PASRR Number:    Discharge Plan: SNF    Current Diagnoses: Patient Active Problem List   Diagnosis Date Noted  . Pressure injury of skin 09/19/2019  . Acute right hip pain 09/17/2019  . S/P TAVR (transcatheter aortic valve replacement)   . Chronic respiratory failure ([address]) 12/15/2018  . Depression 12/15/2018  . CAD (coronary artery disease) 12/15/2018  . Chronic diastolic CHF (congestive heart failure) ([place]) 12/15/2018  . History of pulmonary embolism 08/25/2018  . Severe aortic stenosis   . Aortic atherosclerosis ([place]) 05/20/2018  . Insomnia 04/08/2016  . COPD (chronic obstructive pulmonary disease) ([place]) 05/07/2015  . Labial cyst 05/07/2015  . Exertional dyspnea 02/26/2015  . Vitamin D deficiency 01/14/2009  . Hyperlipidemia 01/14/2009  . DEGENERATIVE JOINT DISEASE 06/12/2008  . Fibromyalgia 12/15/2007  . Generalized anxiety disorder 12/14/2007    Orientation RESPIRATION BLADDER Height & Weight     Self, Time, Situation, Place  Normal Incontinent, External catheter Weight: 59 kg Height:  5\' 5"  (165.1 cm)  BEHAVIORAL SYMPTOMS/MOOD NEUROLOGICAL BOWEL NUTRITION STATUS  Other (Comment)(N/A) (N/A) Continent Diet  AMBULATORY STATUS COMMUNICATION OF NEEDS Skin   Limited Assist Verbally Surgical wounds, PU Stage and Appropriate Care(Thigh, left groin) PU  Stage 1 Dressing: (Foam dressing as needed)                     Personal Care Assistance Level of Assistance  Bathing, Feeding, Dressing, Total care Bathing Assistance: Maximum assistance Feeding assistance: Limited assistance Dressing Assistance: Maximum assistance Total Care Assistance: Maximum assistance   Functional Limitations Info  Sight, Hearing, Speech Sight Info: Impaired Hearing Info: Impaired Speech Info: Adequate    SPECIAL CARE FACTORS FREQUENCY  PT (By licensed PT), OT (By licensed OT)     PT Frequency: Min 5X/week OT Frequency: Min 2X/week            Contractures Contractures Info: Not present    Additional Factors Info  Code Status, Allergies, Psychotropic Code Status Info: Full Code Allergies Info: Iohexol; contrast media Psychotropic Info: Xanax         Current Medications (09/21/2019):  This is the current hospital active medication list Current Facility-Administered Medications  Medication Dose Route Frequency Provider Last Rate Last Dose  . acetaminophen (TYLENOL) tablet 325-650 mg  325-650 mg Oral Q6H PRN Prudencio Burly III, PA-C   650 mg at 09/21/19 0457  . albuterol (PROVENTIL) (2.5 MG/3ML) 0.083% nebulizer solution 2.5 mg  2.5 mg Inhalation Q6H PRN Prudencio Burly III, PA-C   2.5 mg at 09/18/19 0044  . ALPRAZolam Duanne Moron) tablet 0.5 mg  0.5 mg Oral QHS Prudencio Burly III, PA-C   0.5 mg at 09/20/19 2220  . arformoterol (BROVANA) nebulizer solution 15 mcg  15 mcg Nebulization BID Prudencio Burly III, PA-C   15 mcg at 09/21/19 [id number]  . aspirin EC tablet  81 mg  81 mg Oral Daily Amin, Ankit Chirag, MD   81 mg at 09/21/19 1058  . budesonide (PULMICORT) nebulizer solution 0.5 mg  0.5 mg Nebulization BID Amin, Ankit Chirag, MD   0.5 mg at 09/21/19 0802  . calcium-vitamin D (OSCAL WITH D) 500-200 MG-UNIT per tablet 2 tablet  2 tablet Oral Q breakfast Mercy Riding, MD   2 tablet at 09/21/19 1058  . docusate sodium  (COLACE) capsule 100 mg  100 mg Oral BID Prudencio Burly III, PA-C   100 mg at 09/21/19 1058  . enoxaparin (LOVENOX) injection 40 mg  40 mg Subcutaneous Q24H Prudencio Burly III, PA-C   40 mg at 09/21/19 1058  . escitalopram (LEXAPRO) tablet 20 mg  20 mg Oral q morning - 10a Prudencio Burly III, PA-C   20 mg at 09/21/19 1058  . HYDROcodone-acetaminophen (NORCO/VICODIN) 5-325 MG per tablet 0.5-1 tablet  0.5-1 tablet Oral Q4H PRN Prudencio Burly III, PA-C   1 tablet at 09/19/19 2126  . lactated ringers infusion   Intravenous Continuous Prudencio Burly III, PA-C 50 mL/hr at 09/20/19 0300    . lidocaine (LIDODERM) 5 % 1 patch  1 patch Transdermal Q24H Prudencio Burly III, PA-C   1 patch at 09/21/19 1100  . loratadine (CLARITIN) tablet 10 mg  10 mg Oral Daily Amin, Ankit Chirag, MD   10 mg at 09/21/19 1058  . morphine 2 MG/ML injection 1.5 mg  1.5 mg Intravenous Q4H PRN Prudencio Burly III, PA-C   1.5 mg at 09/18/19 0749  . ondansetron (ZOFRAN) tablet 4 mg  4 mg Oral Q6H PRN Prudencio Burly III, PA-C       Or  . ondansetron [facility]) injection 4 mg  4 mg Intravenous Q6H PRN Prudencio Burly III, PA-C   4 mg at 09/19/19 2126  . pantoprazole (PROTONIX) EC tablet 40 mg  40 mg Oral Q1200 Amin, Ankit Chirag, MD   40 mg at 09/21/19 1144  . polyethylene glycol (MIRALAX / GLYCOLAX) packet 17 g  17 g Oral Daily PRN Martensen, Charna Elizabeth III, PA-C      . senna-docusate (Senokot-S) tablet 2 tablet  2 tablet Oral QHS PRN Amin, Ankit Chirag, MD      . simvastatin (ZOCOR) tablet 20 mg  20 mg Oral q1800 Prudencio Burly III, PA-C   20 mg at 09/20/19 1726  . Vitamin D (Ergocalciferol) (DRISDOL) capsule 50,000 Units  50,000 Units Oral Q7 days Mercy Riding, MD   50,000 Units at 09/21/19 1055     Discharge Medications: Please see discharge summary for a list of discharge medications.  Relevant Imaging Results:  Relevant Lab  Results:   Additional Information Kyphotic;  SSN: [id number]  Midge Minium MSN, RN, NCM-BC, ACM-RN [phone]   Please be advised that the above-named patient will require a short-term nursing home stay-anticipated 30 days or less for rehabilitation and strengthening. The plan is for return home.

## 2019-09-22 DIAGNOSIS — D62 Acute posthemorrhagic anemia: Secondary | ICD-10-CM

## 2019-09-22 DIAGNOSIS — L89899 Pressure ulcer of other site, unspecified stage: Secondary | ICD-10-CM

## 2019-09-22 DIAGNOSIS — K5900 Constipation, unspecified: Secondary | ICD-10-CM

## 2019-09-22 DIAGNOSIS — N179 Acute kidney failure, unspecified: Secondary | ICD-10-CM

## 2019-09-22 DIAGNOSIS — D539 Nutritional anemia, unspecified: Secondary | ICD-10-CM

## 2019-09-22 LAB — COMPREHENSIVE METABOLIC PANEL
ALT: 8 U/L (ref 0–44)
AST: 19 U/L (ref 15–41)
Albumin: 2.6 g/dL — ABNORMAL LOW (ref 3.5–5.0)
Alkaline Phosphatase: 94 U/L (ref 38–126)
Anion gap: 6 (ref 5–15)
BUN: 12 mg/dL (ref 8–23)
CO2: 32 mmol/L (ref 22–32)
Calcium: 8.7 mg/dL — ABNORMAL LOW (ref 8.9–10.3)
Chloride: 99 mmol/L (ref 98–111)
Creatinine, Ser: 0.71 mg/dL (ref 0.44–1.00)
GFR calc Af Amer: >60 mL/min (ref >60)
GFR calc non Af Amer: >60 mL/min (ref >60)
Glucose, Bld: 91 mg/dL (ref 70–99)
Potassium: 4.3 mmol/L (ref 3.5–5.1)
Sodium: 137 mmol/L (ref 135–145)
Total Bilirubin: 0.8 mg/dL (ref 0.3–1.2)
Total Protein: 5.3 g/dL — ABNORMAL LOW (ref 6.5–8.1)

## 2019-09-22 LAB — RETICULOCYTES
Immature Retic Fract: 7.9 % (ref 2.3–15.9)
RBC.: 3.54 MIL/uL — ABNORMAL LOW (ref 3.87–5.11)
Retic Count, Absolute: 45.7 10*3/uL (ref 19.0–186.0)
Retic Ct Pct: 1.3 % (ref 0.4–3.1)

## 2019-09-22 LAB — HEMOGLOBIN AND HEMATOCRIT, BLOOD
HCT: 31.2 % — ABNORMAL LOW (ref 36.0–46.0)
Hemoglobin: 10.1 g/dL — ABNORMAL LOW (ref 12.0–15.0)

## 2019-09-22 LAB — MAGNESIUM: Magnesium: 2 mg/dL (ref 1.7–2.4)

## 2019-09-22 LAB — FERRITIN: Ferritin: 122 ng/mL (ref 11–307)

## 2019-09-22 LAB — IRON AND TIBC
Iron: 60 ug/dL (ref 28–170)
Saturation Ratios: 25 % (ref 10.4–31.8)
TIBC: 244 ug/dL — ABNORMAL LOW (ref 250–450)
UIBC: 184 ug/dL

## 2019-09-22 LAB — VITAMIN B12: Vitamin B-12: 149 pg/mL — ABNORMAL LOW (ref 180–914)

## 2019-09-22 LAB — FOLATE: Folate: 7.3 ng/mL (ref >5.9)

## 2019-09-22 MED ORDER — VITAMIN B-12 1000 MCG PO TABS
1000.0000 ug | ORAL_TABLET | Freq: Every day | ORAL | Status: DC
  Administered 2019-09-23 – 2019-09-26 (×4): 1000 ug via ORAL
  Filled 2019-09-22 (×5): qty 1

## 2019-09-22 MED ORDER — CYANOCOBALAMIN 1000 MCG/ML IJ SOLN
1000.0000 ug | Freq: Once | INTRAMUSCULAR | Status: AC
  Administered 2019-09-22: 17:00:00 1000 ug via INTRAMUSCULAR
  Filled 2019-09-22: qty 1

## 2019-09-22 NOTE — Progress Notes (Signed)
PROGRESS NOTE  TEKOA MAROHL [id number] DOB: 1926-09-30   PCP: [name], [name], MD  Patient is from: Home.  Lives with her daughter.  DOA: 09/17/2019 LOS: 4  Brief Narrative / Interim history: 85 year old with history of severe AI status post replacement, COPD with chronic hypoxia, fibromyalgia, GAD presented with severe right-sided hip pain after a fall about 10 days prior to the admission.  CT was negative for acute fracture but MRI was suggestive of proximal right hip insufficiency fracture with small effusion, moderate osteoarthritis, bursitis.  She underwent intramedullary nailing on 09/19/2019.  Evaluated by PT/OT who recommended home health with 24-hour supervision.  However, family not able to provide 24-hour supervision.  CSW consulted for SNF.  Subjective: No major events overnight of this morning.  She stated that she did not have a good sleep last night.  She says she was very uncomfortable in the bed.  She is now sitting on bedside chair eating breakfast, and feels much better.  She says she has not a bowel movement in days.  Denies chest pain, dyspnea, nausea, vomiting or UTI symptoms.  Surgical site pain fairly controlled.  Objective: Vitals:   09/21/19 1931 09/22/19 0355 09/22/19 0758 09/22/19 0800  BP: 131/65 (!) 144/59    Pulse: 72 72    Resp: 17 15    Temp: 98.4 F (36.9 C) 98.4 F (36.9 C)    TempSrc: Oral Oral    SpO2: 98% 98% 99% 99%  Weight:      Height:        Intake/Output Summary (Last 24 hours) at 09/22/2019 1107 Last data filed at 09/22/2019 0912 Gross per 24 hour  Intake 720 ml  Output 1100 ml  Net -380 ml   Filed Weights   09/19/19 1804  Weight: 59 kg    Examination:  GENERAL: No acute distress.  Appears well.  HEENT: MMM.  Vision and hearing grossly intact.  NECK: Supple.  No apparent JVD.  RESP:  No IWOB.  Fair air movement bilaterally CVS:  RRR. Heart sounds normal.  ABD/GI/GU: Bowel sounds present. Soft. Non tender.   MSK/EXT: No apparent deformity or edema.  Dressing over RLE hip DCI. SKIN: no apparent skin lesion or wound NEURO: Awake, alert and oriented appropriately.  No apparent focal neuro deficit. PSYCH: Calm. Normal affect.  Assessment & Plan: Acute right hip pain secondary to proximal right femoral intertrochanteric insufficiency fracture Right hip osteoarthritis and peritrochanteric bursitis Vitamin D deficiency -Underwent intramedullary nailing on 09/19/2019 by Dr. Percell Miller -Pain management with bowel regimen -Vitamin D 50,000 units weekly started 09/21/2019 -Daily calcium/vitamin D -Per Ortho, WBAT, dressing until follow-up and subcu Lovenox for 4 weeks -PT/OT  Chronic COPD: Stable -Continue home bronchodilators -Wean oxygen as able  History of CAD/AI s/p AVR/chronic diastolic CHF: No cardiopulmonary symptoms.  Excellent urine output off diuretics.  1.5 L / 24 hours and 2 unmeasured voids.  She appears euvolemic. -Continue home statin and aspirin. -Home Lasix on hold. -Monitor fluid status  AKI: Creatinine peaked at 1.03.  Now back to baseline. -Continue monitoring  Acute blood loss/macrocytic anemia: Hgb 12.8 (admit)>> 10.1-likely due to blood draws.  Denies melena or hematochezia. -Anemia: -Monitor H&H  GAD/insomnia/fibromyalgia: Stable -Continue home Xanax and Lexapro  Pressure Injury 09/18/19 Coccyx Mid;Lower Stage I -  Intact skin with non-blanchable redness of a localized area usually over a bony prominence. (Active)  09/18/19 0940  Location: Coccyx  Location Orientation: Mid;Lower  Staging: Stage I -  Intact skin with non-blanchable  redness of a localized area usually over a bony prominence.  Wound Description (Comments):   Present on Admission:                DVT prophylaxis: Subcu Lovenox Code Status: Full code Family Communication: Updated patient's daughter over the phone 11/25. Disposition Plan: Remains inpatient.  Final disposition SNF Consultants:  Orthopedic surgery  Procedures:  11/23-intramedullary nailing of right hip by Dr. Percell Miller  Microbiology summarized: COVID-19 screen negative MRSA/staph PCR negative  Sch Meds:  Scheduled Meds: . ALPRAZolam  0.5 mg Oral QHS  . arformoterol  15 mcg Nebulization BID  . aspirin EC  81 mg Oral Daily  . budesonide  0.5 mg Nebulization BID  . calcium-vitamin D  2 tablet Oral Q breakfast  . docusate sodium  100 mg Oral BID  . enoxaparin (LOVENOX) injection  40 mg Subcutaneous Q24H  . escitalopram  20 mg Oral q morning - 10a  . lidocaine  1 patch Transdermal Q24H  . loratadine  10 mg Oral Daily  . pantoprazole  40 mg Oral Q1200  . simvastatin  20 mg Oral q1800  . Vitamin D (Ergocalciferol)  50,000 Units Oral Q7 days   Continuous Infusions: . lactated ringers 50 mL/hr at 09/20/19 0300   PRN Meds:.acetaminophen, albuterol, HYDROcodone-acetaminophen, morphine injection, ondansetron **OR** ondansetron (ZOFRAN) IV, polyethylene glycol, senna-docusate  Antimicrobials: Anti-infectives (From admission, onward)   Start     Dose/Rate Route Frequency Ordered Stop   09/19/19 2000  ceFAZolin (ANCEF) IVPB 2g/100 mL premix     2 g 200 mL/hr over 30 Minutes Intravenous Every 6 hours 09/19/19 1713 09/20/19 0237   09/19/19 0600  ceFAZolin (ANCEF) IVPB 2g/100 mL premix     2 g 200 mL/hr over 30 Minutes Intravenous To ShortStay Surgical 09/18/19 1925 09/19/19 1351       I have personally reviewed the following labs and images: CBC: Recent Labs  Lab 09/17/19 1547 09/18/19 0507 09/20/19 0258 09/22/19 0429  WBC 8.2 4.7 6.9  --   NEUTROABS 7.2 4.2  --   --   HGB 13.0 12.8 11.3* 10.1*  HCT 41.0 40.5 36.0 31.2*  MCV 103.0* 102.0* 103.4*  --   PLT 198 210 209  --    BMP &GFR Recent Labs  Lab 09/17/19 1547 09/18/19 0507 09/20/19 0258 09/21/19 0308 09/22/19 0429  NA 139 141 139 140 137  K 3.6 4.3 4.9 4.1 4.3  CL 98 98 99 100 99  CO2 31 33* 32 32 32  GLUCOSE 109* 147* 156* 92 91  BUN  12 15 15 19 12   CREATININE 0.83 0.71 0.96 1.03* 0.71  CALCIUM 9.3 9.4 9.0 9.0 8.7*  MG  --   --   --  1.6* 2.0   Estimated Creatinine Clearance: 41.2 mL/min (by C-G formula based on SCr of 0.71 mg/dL). Liver & Pancreas: Recent Labs  Lab 09/21/19 0308 09/22/19 0429  AST 19 19  ALT 8 8  ALKPHOS 88 94  BILITOT 0.9 0.8  PROT 5.4* 5.3*  ALBUMIN 2.5* 2.6*   No results for input(s): LIPASE, AMYLASE in the last 168 hours. No results for input(s): AMMONIA in the last 168 hours. Diabetic: No results for input(s): HGBA1C in the last 72 hours. No results for input(s): GLUCAP in the last 168 hours. Cardiac Enzymes: No results for input(s): CKTOTAL, CKMB, CKMBINDEX, TROPONINI in the last 168 hours. No results for input(s): PROBNP in the last 8760 hours. Coagulation Profile: Recent Labs  Lab 09/17/19 1547  INR 1.0   Thyroid Function Tests: No results for input(s): TSH, T4TOTAL, FREET4, T3FREE, THYROIDAB in the last 72 hours. Lipid Profile: No results for input(s): CHOL, HDL, LDLCALC, TRIG, CHOLHDL, LDLDIRECT in the last 72 hours. Anemia Panel: No results for input(s): VITAMINB12, FOLATE, FERRITIN, TIBC, IRON, RETICCTPCT in the last 72 hours. Urine analysis:    Component Value Date/Time   COLORURINE YELLOW 03/25/2019 1007   APPEARANCEUR CLEAR 03/25/2019 1007   LABSPEC 1.012 03/25/2019 1007   PHURINE 6.0 03/25/2019 [place] 03/25/2019 [place] 07/06/2009 [place] 03/25/2019 [place] 03/25/2019 [place] 03/25/2019 1007   PROTEINUR NEGATIVE 03/25/2019 1007   UROBILINOGEN 0.2 07/06/2009 1057   NITRITE NEGATIVE 03/25/2019 1007   LEUKOCYTESUR NEGATIVE 03/25/2019 1007   Sepsis Labs: Invalid input(s): PROCALCITONIN, [place]  Microbiology: Recent Results (from the past 240 hour(s))  SARS CORONAVIRUS 2 (TAT 6-24 HRS) Nasopharyngeal Nasopharyngeal Swab     Status: None   Collection Time: 09/17/19   7:19 PM   Specimen: Nasopharyngeal Swab  Result Value Ref Range Status   SARS Coronavirus 2 NEGATIVE NEGATIVE Final    Comment: (NOTE) SARS-CoV-2 target nucleic acids are NOT DETECTED. The SARS-CoV-2 RNA is generally detectable in upper and lower respiratory specimens during the acute phase of infection. Negative results do not preclude SARS-CoV-2 infection, do not rule out co-infections with other pathogens, and should not be used as the sole basis for treatment or other patient management decisions. Negative results must be combined with clinical observations, patient history, and epidemiological information. The expected result is Negative. Fact Sheet for Patients: SugarRoll.be Fact Sheet for Healthcare Providers: https://www.woods-mathews.com/ This test is not yet approved or cleared by the [place] FDA and  has been authorized for detection and/or diagnosis of SARS-CoV-2 by FDA under an Emergency Use Authorization (EUA). This EUA will remain  in effect (meaning this test can be used) for the duration of the COVID-19 declaration under Section 56 4(b)(1) of the Act, 21 U.S.C. section 360bbb-3(b)(1), unless the authorization is terminated or revoked sooner. Performed at [facility], [place] [address], [place], [place] [address]   Surgical pcr screen     Status: None   Collection Time: 09/18/19  2:37 PM   Specimen: Nasal Mucosa; Nasal Swab  Result Value Ref Range Status   MRSA, PCR NEGATIVE NEGATIVE Final   Staphylococcus aureus NEGATIVE NEGATIVE Final    Comment: (NOTE) The Xpert SA Assay (FDA approved for NASAL specimens in patients 57 years of age and older), is one component of a comprehensive surveillance program. It is not intended to diagnose infection nor to guide or monitor treatment. Performed at [facility], [place] [address], [place], [place] [address]     Radiology Studies: No results found.   Mark Hassey T.  [place]  If 7PM-7AM, please contact night-coverage [email or web address] Password [facility] 09/22/2019, 11:07 AM

## 2019-09-22 NOTE — Plan of Care (Signed)
  Problem: Education: Goal: Knowledge of General Education information will improve Description: Including pain rating scale, medication(s)/side effects and non-pharmacologic comfort measures Outcome: Progressing   Problem: Health Behavior/Discharge Planning: Goal: Ability to manage health-related needs will improve Outcome: Progressing   Problem: Clinical Measurements: Goal: Ability to maintain clinical measurements within normal limits will improve Outcome: Progressing Goal: Will remain free from infection Outcome: Progressing Goal: Respiratory complications will improve Outcome: Progressing   Problem: Activity: Goal: Risk for activity intolerance will decrease Outcome: Progressing   Problem: Nutrition: Goal: Adequate nutrition will be maintained Outcome: Progressing   Problem: Coping: Goal: Level of anxiety will decrease Outcome: Progressing   Problem: Elimination: Goal: Will not experience complications related to bowel motility Outcome: Progressing   Problem: Pain Managment: Goal: General experience of comfort will improve Outcome: Progressing   Problem: Safety: Goal: Ability to remain free from injury will improve Outcome: Progressing   Problem: Skin Integrity: Goal: Risk for impaired skin integrity will decrease Outcome: Progressing

## 2019-09-22 NOTE — Progress Notes (Signed)
PT Cancellation Note  Patient Details Name: Jessica [name] MRN: [id number] DOB: July 13, 1926   Cancelled Treatment:    Reason Eval/Treat Not Completed: Patient declined, no reason specified(pt halfway through breakfast reporting she just got comfortable and declined mobility at this time)   Sandy Salaam Erin Obando 09/22/2019, 10:11 AM  Bayard Males, PT Acute Rehabilitation Services Pager: [phone] Office: [phone]

## 2019-09-23 DIAGNOSIS — L219 Seborrheic dermatitis, unspecified: Secondary | ICD-10-CM

## 2019-09-23 LAB — HEMOGLOBIN AND HEMATOCRIT, BLOOD
HCT: 35.2 % — ABNORMAL LOW (ref 36.0–46.0)
Hemoglobin: 11.2 g/dL — ABNORMAL LOW (ref 12.0–15.0)

## 2019-09-23 LAB — SARS CORONAVIRUS 2 (TAT 6-24 HRS): SARS Coronavirus 2: NEGATIVE

## 2019-09-23 NOTE — Progress Notes (Signed)
Occupational Therapy Treatment Patient Details Name: Jessica [name] MRN: [id number] DOB: 1926/08/14 Today's Date: 09/23/2019    History of present illness 85 yo admitted after recent fall with Rt hip fx s/p IM nail. PMhx: COPD, CAD, CHF, TAVR, anxiety   OT comments  Pt tolerated peri care and following directions for use of [place] this session for copious sputum secretions. Pt reports "its my COPD". Pt progressing toward goals and able to complete grooming task very automatically. Pt drinking coffee and resting in recliner. Pt noted to have redness at buttock and will need frequent position changes.    Follow Up Recommendations  Home health OT;Supervision/Assistance - 24 hour([facility] program)    Equipment Recommendations  3 in 1 bedside commode;Other (comment)(RW)    Recommendations for Other Services      Precautions / Restrictions Precautions Precautions: Fall Precaution Comments: requesting [facility] home first program as 24/7 (A) could be provided to family  Restrictions Weight Bearing Restrictions: Yes RLE Weight Bearing: Weight bearing as tolerated       Mobility Bed Mobility Overal bed mobility: Needs Assistance Bed Mobility: Supine to Sit     Supine to sit: Min assist;HOB elevated     General bed mobility comments: oob in chair on arrival  Transfers Overall transfer level: Needs assistance Equipment used: Rolling walker (2 wheeled) Transfers: Sit to/from Stand Sit to Stand: Min guard         General transfer comment: cues for hand placement and able to power up from chair. pt ablet o static stand in RW for peri care    Balance Overall balance assessment: Needs assistance Sitting-balance support: Feet supported Sitting balance-Leahy Scale: Good Sitting balance - Comments: steady sitting EOB, able to perform seated LE therex with UE support   Standing balance support: Bilateral upper extremity supported;During functional activity Standing  balance-Leahy Scale: Fair Standing balance comment: pt able to static stand>4 minutes for peri care                           ADL either performed or assessed with clinical judgement   ADL Overall ADL's : Needs assistance/impaired Eating/Feeding: Independent;Sitting Eating/Feeding Details (indicate cue type and reason): pt noted to cough after drinking cough each time Grooming: Wash/dry face;Wash/dry hands;Set up Grooming Details (indicate cue type and reason): pt automatic with task Upper Body Bathing: Min guard   Lower Body Bathing: Moderate assistance Lower Body Bathing Details (indicate cue type and reason): pt able to static stand for peri care     Lower Body Dressing: Maximal assistance   Toilet Transfer: Minimal assistance;RW;BSC Toilet Transfer Details (indicate cue type and reason): simulated from chair           General ADL Comments: pt following all commands with talking in patient left ear     Vision       Perception     Praxis      Cognition Arousal/Alertness: Awake/alert Behavior During Therapy: WFL for tasks assessed/performed Overall Cognitive Status: Impaired/Different from baseline Area of Impairment: Memory;Safety/judgement                     Memory: Decreased recall of precautions   Safety/Judgement: Decreased awareness of safety;Decreased awareness of deficits     General Comments: pt perseverating on coughing up sputum into basin. pt provided [place] to help with decrease secretions in the room        Exercises Total Joint Exercises Ankle  Circles/Pumps: AROM;Both;15 reps Towel Squeeze: AROM;Both;10 reps Heel Slides: AROM;Right;10 reps Hip ABduction/ADduction: AROM;Right;10 reps Straight Leg Raises: AROM;Right;10 reps Long Arc Quad: AROM;Right;10 reps Marching in Standing: AROM;Both;10 reps;Seated Other Exercises Other Exercises: seated heel raises x15, seeated toe raises x15, seated isometric hip abd against  therapist resistance x10   Shoulder Instructions       General Comments pt noted to have copious amounts of sputum and coughing . pt reports "its my COPD" pt noted to have dark urine and reports yes when asked if it burns when she pees    Pertinent Vitals/ Pain       Pain Assessment: No/denies pain  Home Living                                          Prior Functioning/Environment              Frequency  Min 2X/week        Progress Toward Goals  OT Goals(current goals can now be found in the care plan section)  Progress towards OT goals: Progressing toward goals  Acute Rehab OT Goals Patient Stated Goal: return home OT Goal Formulation: With patient Time For Goal Achievement: 10/04/19 Potential to Achieve Goals: Good ADL Goals Pt Will Perform Grooming: with set-up;with supervision;sitting(met) Pt Will Perform Upper Body Bathing: with set-up;with supervision;sitting Pt Will Perform Lower Body Bathing: with mod assist;with caregiver independent in assisting Pt Will Perform Upper Body Dressing: with set-up;with supervision;sitting Pt Will Perform Lower Body Dressing: with mod assist;with caregiver independent in assisting Pt Will Transfer to Toilet: with min guard assist;ambulating;stand pivot transfer;regular height toilet;bedside commode;grab bars Pt Will Perform Toileting - Clothing Manipulation and hygiene: with max assist;with mod assist;sit to/from stand;with caregiver independent in assisting Pt Will Perform Tub/Shower Transfer: with min guard assist;ambulating;Stand pivot transfer;shower seat;3 in 1;rolling walker  Plan Discharge plan remains appropriate    Co-evaluation                 AM-PAC OT "6 Clicks" Daily Activity     Outcome Measure   Help from another person eating meals?: None Help from another person taking care of personal grooming?: A Little Help from another person toileting, which includes using toliet, bedpan, or  urinal?: A Lot Help from another person bathing (including washing, rinsing, drying)?: A Lot Help from another person to put on and taking off regular upper body clothing?: A Little Help from another person to put on and taking off regular lower body clothing?: A Lot 6 Click Score: 16    End of Session Equipment Utilized During Treatment: Rolling walker  OT Visit Diagnosis: Unsteadiness on feet (R26.81);Other abnormalities of gait and mobility (R26.89);Muscle weakness (generalized) (M62.81);History of falling (Z91.81);Other symptoms and signs involving cognitive function   Activity Tolerance Patient tolerated treatment well   Patient Left in chair;with call bell/phone within reach;with chair alarm set   Nurse Communication Mobility status;Precautions        Time: 0626-9485 OT Time Calculation (min): 13 min  Charges: OT General Charges $OT Visit: 1 Visit OT Treatments $Self Care/Home Management : 8-22 mins   Brynn, OTR/L  Acute Rehabilitation Services Pager: [phone] Office: [phone] .    Jeri Modena 09/23/2019, 11:01 AM

## 2019-09-23 NOTE — Progress Notes (Signed)
PROGRESS NOTE  Jessica [name] [id number] DOB: 1925-12-23   PCP: [name], [name], MD  Patient is from: Home.  Lives with her daughter.  DOA: 09/17/2019 LOS: 5  Brief Narrative / Interim history: 85 year old with history of severe AI status post replacement, COPD with chronic hypoxia, fibromyalgia, GAD presented with severe right-sided hip pain after a fall about 10 days prior to the admission.  CT was negative for acute fracture but MRI was suggestive of proximal right hip insufficiency fracture with small effusion, moderate osteoarthritis, bursitis.  She underwent intramedullary nailing on 09/19/2019.  Evaluated by PT/OT who recommended home health with 24-hour supervision.  However, family not able to provide 24-hour supervision.  CSW consulted for SNF.  Subjective: No major events overnight of this morning.  No complaint this morning.  She denies chest pain, dyspnea, GI or GU symptoms.  Had a bowel movement this morning.  Objective: Vitals:   09/22/19 2032 09/23/19 0641 09/23/19 0918 09/23/19 0920  BP:  (!) 133/55    Pulse:  74 81   Resp:  16 20   Temp:  99 F (37.2 C)    TempSrc:  Oral    SpO2: 94% 91% 94% 94%  Weight:      Height:        Intake/Output Summary (Last 24 hours) at 09/23/2019 1045 Last data filed at 09/23/2019 0900 Gross per 24 hour  Intake 600 ml  Output 1500 ml  Net -900 ml   Filed Weights   09/19/19 1804  Weight: 59 kg    Examination:  GENERAL: No acute distress.  Sitting on the edge of the bed working with PT. HEENT: MMM.  Vision and hearing grossly intact.  NECK: Supple.  No apparent JVD.  RESP:  No IWOB. Good air movement bilaterally. CVS:  RRR. Heart sounds normal.  ABD/GI/GU: Bowel sounds present. Soft. Non tender.  MSK/EXT:  Moves extremities. No apparent deformity or edema.  Dressing over RLE hip DCI. SKIN: Ulcerated seborrheic keratosis over her back. NEURO: Awake, alert and oriented fairly.  No apparent focal neuro deficit.  PSYCH: Calm. Normal affect.   Assessment & Plan: Acute right hip pain secondary to proximal right femoral intertrochanteric insufficiency fracture Right hip osteoarthritis and peritrochanteric bursitis Vitamin D deficiency -Underwent intramedullary nailing on 09/19/2019 by Dr. Percell Miller -Pain management with bowel regimen -Vitamin D 50,000 units weekly started 09/21/2019 -Daily calcium/vitamin D -Per Ortho, WBAT, dressing until follow-up and subcu Lovenox for 4 weeks -PT/OT  Chronic COPD: Stable.  On room air. -Continue home bronchodilators  History of CAD/AI s/p AVR/chronic diastolic CHF: No cardiopulmonary symptoms.  Excellent urine output off diuretics.  1.8 L / 24 hours and 1 unmeasured void.  She appears euvolemic. -Continue home statin and aspirin. -Home Lasix on hold-she may not need this going forward. -Monitor fluid status  AKI: Creatinine peaked at 1.03.  Now back to baseline. -Continue monitoring  Acute blood loss/macrocytic anemia: Hgb 12.8 (admit)>> 10.1>11.2-likely due to blood draws. No melena or hematochezia.  Anemia panel consistent with anemia of chronic disease. -Monitor H&H intermittently  GAD/insomnia/fibromyalgia: Stable -Continue home Xanax and Lexapro  Pressure Injury 09/18/19 Coccyx Mid;Lower Stage I -  Intact skin with non-blanchable redness of a localized area usually over a bony prominence. (Active)  09/18/19 0940  Location: Coccyx  Location Orientation: Mid;Lower  Staging: Stage I -  Intact skin with non-blanchable redness of a localized area usually over a bony prominence.  Wound Description (Comments):   Present on Admission: Yes  Ulcerated seborrheic dermatitis over her back -Continue daily dressing              DVT prophylaxis: Subcu Lovenox Code Status: Full code Family Communication: Updated patient's daughter over the phone 11/25. Disposition Plan: Remains inpatient.  Final disposition SNF in the next 24 to 48 hours Consultants:  Orthopedic surgery  Procedures:  11/23-intramedullary nailing of right hip by Dr. Percell Miller  Microbiology summarized: COVID-19 screen negative MRSA/staph PCR negative   Sch Meds:  Scheduled Meds: . ALPRAZolam  0.5 mg Oral QHS  . arformoterol  15 mcg Nebulization BID  . aspirin EC  81 mg Oral Daily  . budesonide  0.5 mg Nebulization BID  . calcium-vitamin D  2 tablet Oral Q breakfast  . docusate sodium  100 mg Oral BID  . enoxaparin (LOVENOX) injection  40 mg Subcutaneous Q24H  . escitalopram  20 mg Oral q morning - 10a  . lidocaine  1 patch Transdermal Q24H  . loratadine  10 mg Oral Daily  . pantoprazole  40 mg Oral Q1200  . simvastatin  20 mg Oral q1800  . vitamin B-12  1,000 mcg Oral Daily  . Vitamin D (Ergocalciferol)  50,000 Units Oral Q7 days   Continuous Infusions: . lactated ringers 50 mL/hr at 09/20/19 0300   PRN Meds:.acetaminophen, albuterol, HYDROcodone-acetaminophen, morphine injection, ondansetron **OR** ondansetron (ZOFRAN) IV, polyethylene glycol, senna-docusate  Antimicrobials: Anti-infectives (From admission, onward)   Start     Dose/Rate Route Frequency Ordered Stop   09/19/19 2000  ceFAZolin (ANCEF) IVPB 2g/100 mL premix     2 g 200 mL/hr over 30 Minutes Intravenous Every 6 hours 09/19/19 1713 09/20/19 0237   09/19/19 0600  ceFAZolin (ANCEF) IVPB 2g/100 mL premix     2 g 200 mL/hr over 30 Minutes Intravenous To [facility] 09/18/19 1925 09/19/19 1351       I have personally reviewed the following labs and images: CBC: Recent Labs  Lab 09/17/19 1547 09/18/19 0507 09/20/19 0258 09/22/19 0429 09/23/19 0252  WBC 8.2 4.7 6.9  --   --   NEUTROABS 7.2 4.2  --   --   --   HGB 13.0 12.8 11.3* 10.1* 11.2*  HCT 41.0 40.5 36.0 31.2* 35.2*  MCV 103.0* 102.0* 103.4*  --   --   PLT 198 210 209  --   --    BMP &GFR Recent Labs  Lab 09/17/19 1547 09/18/19 0507 09/20/19 0258 09/21/19 0308 09/22/19 0429  NA 139 141 139 140 137  K 3.6 4.3 4.9  4.1 4.3  CL 98 98 99 100 99  CO2 31 33* 32 32 32  GLUCOSE 109* 147* 156* 92 91  BUN 12 15 15 19 12   CREATININE 0.83 0.71 0.96 1.03* 0.71  CALCIUM 9.3 9.4 9.0 9.0 8.7*  MG  --   --   --  1.6* 2.0   Estimated Creatinine Clearance: 41.2 mL/min (by C-G formula based on SCr of 0.71 mg/dL). Liver & Pancreas: Recent Labs  Lab 09/21/19 0308 09/22/19 0429  AST 19 19  ALT 8 8  ALKPHOS 88 94  BILITOT 0.9 0.8  PROT 5.4* 5.3*  ALBUMIN 2.5* 2.6*   No results for input(s): LIPASE, AMYLASE in the last 168 hours. No results for input(s): AMMONIA in the last 168 hours. Diabetic: No results for input(s): HGBA1C in the last 72 hours. No results for input(s): GLUCAP in the last 168 hours. Cardiac Enzymes: No results for input(s): CKTOTAL, CKMB, CKMBINDEX, TROPONINI in the  last 168 hours. No results for input(s): PROBNP in the last 8760 hours. Coagulation Profile: Recent Labs  Lab 09/17/19 1547  INR 1.0   Thyroid Function Tests: No results for input(s): TSH, T4TOTAL, FREET4, T3FREE, THYROIDAB in the last 72 hours. Lipid Profile: No results for input(s): CHOL, HDL, LDLCALC, TRIG, CHOLHDL, LDLDIRECT in the last 72 hours. Anemia Panel: Recent Labs    09/22/19 1127  VITAMINB12 149*  FOLATE 7.3  FERRITIN 122  TIBC 244*  IRON 60  RETICCTPCT 1.3   Urine analysis:    Component Value Date/Time   COLORURINE YELLOW 03/25/2019 1007   APPEARANCEUR CLEAR 03/25/2019 1007   LABSPEC 1.012 03/25/2019 1007   PHURINE 6.0 03/25/2019 [place] 03/25/2019 1007   GLUCOSEU NEGATIVE 07/06/2009 1057   HGBUR NEGATIVE 03/25/2019 [place] 03/25/2019 [place] 03/25/2019 1007   PROTEINUR NEGATIVE 03/25/2019 1007   UROBILINOGEN 0.2 07/06/2009 1057   NITRITE NEGATIVE 03/25/2019 1007   LEUKOCYTESUR NEGATIVE 03/25/2019 1007   Sepsis Labs: Invalid input(s): PROCALCITONIN, [place]  Microbiology: Recent Results (from the past 240 hour(s))  SARS  CORONAVIRUS 2 (TAT 6-24 HRS) Nasopharyngeal Nasopharyngeal Swab     Status: None   Collection Time: 09/17/19  7:19 PM   Specimen: Nasopharyngeal Swab  Result Value Ref Range Status   SARS Coronavirus 2 NEGATIVE NEGATIVE Final    Comment: (NOTE) SARS-CoV-2 target nucleic acids are NOT DETECTED. The SARS-CoV-2 RNA is generally detectable in upper and lower respiratory specimens during the acute phase of infection. Negative results do not preclude SARS-CoV-2 infection, do not rule out co-infections with other pathogens, and should not be used as the sole basis for treatment or other patient management decisions. Negative results must be combined with clinical observations, patient history, and epidemiological information. The expected result is Negative. Fact Sheet for Patients: SugarRoll.be Fact Sheet for Healthcare Providers: https://www.woods-mathews.com/ This test is not yet approved or cleared by the [place] FDA and  has been authorized for detection and/or diagnosis of SARS-CoV-2 by FDA under an Emergency Use Authorization (EUA). This EUA will remain  in effect (meaning this test can be used) for the duration of the COVID-19 declaration under Section 56 4(b)(1) of the Act, 21 U.S.C. section 360bbb-3(b)(1), unless the authorization is terminated or revoked sooner. Performed at [facility], [place] [address] [place], [place] [address]   Surgical pcr screen     Status: None   Collection Time: 09/18/19  2:37 PM   Specimen: Nasal Mucosa; Nasal Swab  Result Value Ref Range Status   MRSA, PCR NEGATIVE NEGATIVE Final   Staphylococcus aureus NEGATIVE NEGATIVE Final    Comment: (NOTE) The Xpert SA Assay (FDA approved for NASAL specimens in patients 70 years of age and older), is one component of a comprehensive surveillance program. It is not intended to diagnose infection nor to guide or monitor treatment. Performed at [facility], [place] [address] [place], [place] [address]     Radiology Studies: No results found.   Taye T. Taliaferro  If 7PM-7AM, please contact night-coverage [email or web address] Password [email or web address] 09/23/2019, 10:45 AM

## 2019-09-23 NOTE — Progress Notes (Signed)
Physical Therapy Treatment Patient Details Name: Jessica [name] MRN: [id number] DOB: [birth year] Today's Date: 09/23/2019    History of Present Illness 85 yo admitted after recent fall with Rt hip fx s/p IM nail. PMhx: COPD, CAD, CHF, TAVR, anxiety    PT Comments    Pt in bed upon arrival and agreeable to PT. Pt oriented although did not remember that she has been working with PT and ambulating. Pt required min A for bed mobility. Initial STS pt required min A to rise with cuing for correct hand placement. Pt had period of bowel incontinence. Pt required max A to rise from bed second trial for hygiene. Pt tolerated standing with RW ~8 min while nursing assisted with self care and pt hygiene. Pt required min guard assist for standing balance and frequent cuing for shifting hips forward to correct posterior lean and for upright posture. Pt min A to ambulate to recliner. Pt performed LE therex with min cuing for correct performance. SpO2 88% during supine therex, upper 90s during sitting therex and 90% after standing and ambulation to recliner on RA. Pt presents with decrease strength, ROM, balance and awareness and would benefit from cont skilled acute therapy. Pt currently requires 24/7 assist so if that is not an option at home from family recommendation for SNF to further improve deficits, increase independence with mobility and decrease fall risk.    Follow Up Recommendations  Home health PT;Supervision/Assistance - 24 hour (SNF if unable to get 24/7 assist at home)     Equipment Recommendations  3in1 (PT)    Recommendations for Other Services       Precautions / Restrictions Precautions Precautions: Fall Restrictions Weight Bearing Restrictions: Yes RLE Weight Bearing: Weight bearing as tolerated    Mobility  Bed Mobility Overal bed mobility: Needs Assistance Bed Mobility: Supine to Sit     Supine to sit: Min assist;HOB elevated     General bed mobility comments: cuing for  initiation and sequencing, min A for LE advancement and trunk elevation, cuing for scooting towards EOB  Transfers Overall transfer level: Needs assistance Equipment used: Rolling walker (2 wheeled) Transfers: Sit to/from Stand Sit to Stand: Min assist;Max assist         General transfer comment: x2 trials from bed, initial STS min A to rise and cuing for hand placement/ RW mgt, second trial pt required max A to achieve full standing with no carryover of hand placement  Ambulation/Gait Ambulation/Gait assistance: Min assist Gait Distance (Feet): 5 Feet Assistive device: Rolling walker (2 wheeled) Gait Pattern/deviations: Step-through pattern;Decreased stride length;Trunk flexed Gait velocity: decreased   General Gait Details: min A to walk to recliner, cuing for posture and RW mgt, assist to direct RW   Stairs             Wheelchair Mobility    Modified Rankin (Stroke Patients Only)       Balance Overall balance assessment: Needs assistance;History of Falls Sitting-balance support: Feet supported Sitting balance-Leahy Scale: Good Sitting balance - Comments: steady sitting EOB, able to perform seated LE therex with UE support   Standing balance support: Bilateral upper extremity supported;During functional activity Standing balance-Leahy Scale: Poor Standing balance comment: reliant on external support                            Cognition Arousal/Alertness: Awake/alert Behavior During Therapy: WFL for tasks assessed/performed Overall Cognitive Status: Impaired/Different from baseline Area of Impairment: Memory;Safety/judgement  Memory: Decreased recall of precautions   Safety/Judgement: Decreased awareness of safety;Decreased awareness of deficits            Exercises Total Joint Exercises Ankle Circles/Pumps: AROM;Both;15 reps Towel Squeeze: AROM;Both;10 reps Heel Slides: AROM;Right;10 reps Hip  ABduction/ADduction: AROM;Right;10 reps Straight Leg Raises: AROM;Right;10 reps Long Arc Quad: AROM;Right;10 reps Marching in Standing: AROM;Both;10 reps;Seated Other Exercises Other Exercises: seated heel raises x15, seeated toe raises x15, seated isometric hip abd against therapist resistance x10    General Comments        Pertinent Vitals/Pain Pain Assessment: No/denies pain    Home Living                      Prior Function            PT Goals (current goals can now be found in the care plan section) Progress towards PT goals: Progressing toward goals    Frequency    Min 5X/week      PT Plan Current plan remains appropriate    Co-evaluation              AM-PAC PT "6 Clicks" Mobility   Outcome Measure  Help needed turning from your back to your side while in a flat bed without using bedrails?: A Little Help needed moving from lying on your back to sitting on the side of a flat bed without using bedrails?: A Little Help needed moving to and from a bed to a chair (including a wheelchair)?: A Little Help needed standing up from a chair using your arms (e.g., wheelchair or bedside chair)?: A Lot Help needed to walk in hospital room?: A Little Help needed climbing 3-5 steps with a railing? : A Lot 6 Click Score: 16    End of Session Equipment Utilized During Treatment: Gait belt Activity Tolerance: Patient tolerated treatment well Patient left: with call bell/phone within reach;in chair;with chair alarm set Nurse Communication: Mobility status PT Visit Diagnosis: Other abnormalities of gait and mobility (R26.89);History of falling (Z91.81)     Time: 0822-0910 PT Time Calculation (min) (ACUTE ONLY): 48 min  Charges:  $Therapeutic Exercise: 8-22 mins $Therapeutic Activity: 23-37 mins                     Jessica [name] PT, DPT 9:24 AM,09/23/19    Jessica [name] Jessica [name] 09/23/2019, 9:24 AM

## 2019-09-23 NOTE — TOC Initial Note (Addendum)
Transition of Care [facility]) - Initial/Assessment Note    Patient Details  Name: Jessica [name] MRN: [id number] Date of Birth: May 08, 1926  Transition of Care [facility]) CM/SW Contact:    Midge Minium MSN, RN, NCM-BC, ACM-RN [phone] Phone Number: 09/23/2019, 9:48 AM  Clinical Narrative:                 CM following for transitional needs. CM spoke to the patient and her daughter, Erin Hearing to discuss the POC. The patient provided verbal permission to discuss her DCP needs with her daughter. Patient lived at home with her daughter PTA, with assistance provided by her daughter as needed. Patient is a 85 yo admitted after recent fall with Rt hip fx s/p IM nail. PMhx: COPD, CAD, CHF, TAVR, anxiety. PT/OT eval completed with HH with 24hr assist recommended vs SNF (if family unable to provide 24/7 assist at home). CM discussed the recommendations, with ST SNF requested. CMS SNF preference provided, with [facility] or [facility] selected. FL2 faxed to the selected facilities. PASRR still pending d/t the patients history of anxiety, with the requested documentation faxed to [facility] [facility]. CM team will continue to follow.   Expected Discharge Plan: Skilled Nursing Facility Barriers to Discharge: [facility])   Patient Goals and CMS Choice Patient states their goals for this hospitalization and ongoing recovery are:: "to return home" CMS Medicare.gov Compare Post Acute Care list provided to:: Patient Choice offered to / list presented to : Patient, Adult Children  Expected Discharge Plan and Services Expected Discharge Plan: [place] In-house Referral: NA Discharge Planning Services: CM Consult Post Acute Care Choice: [place] Living arrangements for the past 2 months: Single Family Home Expected Discharge Date: 09/21/19               DME Arranged: N/A DME Agency: NA       HH Arranged: NA [place] Agency: NA        Prior Living  Arrangements/Services Living arrangements for the past 2 months: Single Family Home Lives with:: Self, Adult Children Patient language and need for interpreter reviewed:: Yes Do you feel safe going back to the place where you live?: Yes      Need for Family Participation in Patient Care: Yes (Comment) Care giver support system in place?: Yes (comment) Current home services: DME Criminal Activity/Legal Involvement Pertinent to Current Situation/Hospitalization: No - Comment as needed  Activities of Daily Living Home Assistive Devices/Equipment: None ADL Screening (condition at time of admission) Patient's cognitive ability adequate to safely complete daily activities?: Yes Is the patient deaf or have difficulty hearing?: Yes Does the patient have difficulty seeing, even when wearing glasses/contacts?: No Does the patient have difficulty concentrating, remembering, or making decisions?: No Patient able to express need for assistance with ADLs?: Yes Does the patient have difficulty dressing or bathing?: No Independently performs ADLs?: No Communication: Independent Dressing (OT): Needs assistance Is this a change from baseline?: Pre-admission baseline Grooming: Independent Feeding: Independent Bathing: Independent Toileting: Independent In/Out Bed: Independent Walks in Home: Needs assistance Is this a change from baseline?: Change from baseline, expected to last >3 days Does the patient have difficulty walking or climbing stairs?: Yes Weakness of Legs: Right Weakness of Arms/Hands: Right  Permission Sought/Granted Permission sought to share information with : Case Manager, Family Supports, Customer service manager Permission granted to share information with : Yes, Verbal Permission Granted  Share Information with NAME: Erin Hearing  Permission granted to share info w  AGENCY: [facility], [facility]  Permission granted to share info w Relationship: daughter  Permission  granted to share info w Contact Information: [phone]  Emotional Assessment Appearance:: Appears stated age Attitude/Demeanor/Rapport: Gracious Affect (typically observed): Pleasant Orientation: : Oriented to Self, Oriented to Place, Oriented to  Time, Oriented to Situation Alcohol / Substance Use: Not Applicable Psych Involvement: No (comment)  Admission diagnosis:  Right hip pain [M25.551] Fall, initial encounter [W19.XXXA] Unable to ambulate [R26.2] Patient Active Problem List   Diagnosis Date Noted  . Pressure injury of skin 09/19/2019  . Acute right hip pain 09/17/2019  . S/P TAVR (transcatheter aortic valve replacement)   . Chronic respiratory failure ([place]) 12/15/2018  . Depression 12/15/2018  . CAD (coronary artery disease) 12/15/2018  . Chronic diastolic CHF (congestive heart failure) ([place]) 12/15/2018  . History of pulmonary embolism 08/25/2018  . Severe aortic stenosis   . Aortic atherosclerosis ([place]) 05/20/2018  . Insomnia 04/08/2016  . COPD (chronic obstructive pulmonary disease) ([place]) 05/07/2015  . Labial cyst 05/07/2015  . Exertional dyspnea 02/26/2015  . Vitamin D deficiency 01/14/2009  . Hyperlipidemia 01/14/2009  . DEGENERATIVE JOINT DISEASE 06/12/2008  . Fibromyalgia 12/15/2007  . Generalized anxiety disorder 12/14/2007   PCP:  [name], [name], MD Pharmacy:   [facility], [place] [id number] [place] [id number] Phone: [phone] Fax: [phone]  [facility] [place] [name] - [address] [name] [address] Phone: [phone] Fax: [phone]     Social Determinants of Health (SDOH) Interventions    Readmission Risk Interventions Readmission Risk Prevention Plan 04/01/2019  Post Dischage Appt Complete  Medication Screening Complete  Transportation Screening Complete  Some recent data might be hidden

## 2019-09-24 DIAGNOSIS — M25561 Pain in right knee: Secondary | ICD-10-CM

## 2019-09-24 DIAGNOSIS — M25562 Pain in left knee: Secondary | ICD-10-CM

## 2019-09-24 LAB — HEMOGLOBIN AND HEMATOCRIT, BLOOD
HCT: 34 % — ABNORMAL LOW (ref 36.0–46.0)
Hemoglobin: 10.9 g/dL — ABNORMAL LOW (ref 12.0–15.0)

## 2019-09-24 MED ORDER — DICLOFENAC SODIUM 1 % EX GEL
2.0000 g | Freq: Four times a day (QID) | CUTANEOUS | Status: DC
  Administered 2019-09-24 – 2019-09-26 (×9): 2 g via TOPICAL
  Filled 2019-09-24: qty 100

## 2019-09-24 NOTE — Plan of Care (Signed)
  Problem: Education: Goal: Knowledge of General Education information will improve Description Including pain rating scale, medication(s)/side effects and non-pharmacologic comfort measures Outcome: Progressing   

## 2019-09-24 NOTE — Progress Notes (Signed)
PROGRESS NOTE  Jessica [name] [id number] DOB: 12/31/1925   PCP: [name], [name], MD  Patient is from: Home.  Lives with her daughter.  DOA: 09/17/2019 LOS: 6  Brief Narrative / Interim history: 85 year old with history of severe AI status post replacement, COPD with chronic hypoxia, fibromyalgia, GAD presented with severe right-sided hip pain after a fall about 10 days prior to the admission.  CT was negative for acute fracture but MRI was suggestive of proximal right hip insufficiency fracture with small effusion, moderate osteoarthritis, bursitis.  She underwent intramedullary nailing on 09/19/2019.  Evaluated by PT/OT who recommended home health with 24-hour supervision.  However, family not able to provide 24-hour supervision.  CSW consulted for SNF. Waiting on PASRR.  Subjective: No major events overnight of this morning.  Reports some bilateral knee pains.  Denies chest pain, dyspnea, GI or GU symptoms.  Objective: Vitals:   09/23/19 1948 09/23/19 2003 09/24/19 0251 09/24/19 0831  BP:  140/71 (!) 146/59 119/79  Pulse:  74 67 84  Resp:  16 14   Temp:  (!) 97.5 F (36.4 C) (!) 97.5 F (36.4 C) 99.5 F (37.5 C)  TempSrc:  Oral Oral Oral  SpO2: 95% 90% 94% 94%  Weight:      Height:        Intake/Output Summary (Last 24 hours) at 09/24/2019 1401 Last data filed at 09/24/2019 1300 Gross per 24 hour  Intake 474 ml  Output 800 ml  Net -326 ml   Filed Weights   09/19/19 1804  Weight: 59 kg    Examination:  GENERAL: No acute distress.  Sitting in bed. HEENT: MMM.  Vision and hearing grossly intact.  NECK: Supple.  No apparent JVD.  RESP:  No IWOB. Good air movement bilaterally. CVS:  RRR. Heart sounds normal.  ABD/GI/GU: Bowel sounds present. Soft. Non tender.  MSK/EXT:  Moves extremities.  No swelling, tenderness or erythema over both knees.  Dressing over RLE SKIN: Ulcerated seborrheic keratosis over his back.  Dressing over RLE. NEURO: Awake, alert and  oriented fairly.  No apparent focal neuro deficit. PSYCH: Calm. Normal affect.  Assessment & Plan: Acute right hip pain secondary to proximal right femoral intertrochanteric insufficiency fracture Right hip osteoarthritis and peritrochanteric bursitis Vitamin D deficiency -Underwent intramedullary nailing on 09/19/2019 by Dr. Percell Miller -Pain management with bowel regimen -Vitamin D 50,000 units weekly started 09/21/2019 -Daily calcium/vitamin D -Per Ortho, WBAT, dressing until follow-up and subcu Lovenox for 4 weeks -PT/OT  Chronic COPD: Stable.  On room air. -Continue home bronchodilators  History of CAD/AI s/p AVR/chronic diastolic CHF: No cardiopulmonary symptoms.  Good urine output off diuretics.  Appears euvolemic. -Continue home statin and aspirin. -Home Lasix on hold-she may not need this going forward. -Monitor fluid status  AKI: Creatinine peaked at 1.03.  Now back to baseline. -Continue monitoring  Acute blood loss/macrocytic anemia: Hgb 12.8 (admit)>> 10.1>11.2-likely due to blood draws. No melena or hematochezia.  Anemia panel consistent with anemia of chronic disease. -Monitor H&H intermittently  GAD/insomnia/fibromyalgia: Stable -Continue home Xanax and Lexapro  Bilateral knee pain: Exam reassuring. -Voltaren gel  Pressure Injury 09/18/19 Coccyx Mid;Lower Stage I -  Intact skin with non-blanchable redness of a localized area usually over a bony prominence. (Active)  09/18/19 0940  Location: Coccyx  Location Orientation: Mid;Lower  Staging: Stage I -  Intact skin with non-blanchable redness of a localized area usually over a bony prominence.  Wound Description (Comments):   Present on Admission: Yes  Ulcerated seborrheic dermatitis over her back -Continue daily dressing              DVT prophylaxis: Subcu Lovenox Code Status: Full code Family Communication: Updated patient's daughter over the phone 11/25. Disposition Plan: Remains inpatient.  Final  disposition SNF in the next 24 to 48 hours Consultants: Orthopedic surgery  Procedures:  11/23-intramedullary nailing of right hip by Dr. Percell Miller  Microbiology summarized: COVID-19 screen negative MRSA/staph PCR negative   Sch Meds:  Scheduled Meds: . ALPRAZolam  0.5 mg Oral QHS  . arformoterol  15 mcg Nebulization BID  . aspirin EC  81 mg Oral Daily  . budesonide  0.5 mg Nebulization BID  . calcium-vitamin D  2 tablet Oral Q breakfast  . docusate sodium  100 mg Oral BID  . enoxaparin (LOVENOX) injection  40 mg Subcutaneous Q24H  . escitalopram  20 mg Oral q morning - 10a  . lidocaine  1 patch Transdermal Q24H  . loratadine  10 mg Oral Daily  . pantoprazole  40 mg Oral Q1200  . simvastatin  20 mg Oral q1800  . vitamin B-12  1,000 mcg Oral Daily  . Vitamin D (Ergocalciferol)  50,000 Units Oral Q7 days   Continuous Infusions: . lactated ringers 50 mL/hr at 09/20/19 0300   PRN Meds:.acetaminophen, albuterol, HYDROcodone-acetaminophen, morphine injection, ondansetron **OR** ondansetron (ZOFRAN) IV, polyethylene glycol, senna-docusate  Antimicrobials: Anti-infectives (From admission, onward)   Start     Dose/Rate Route Frequency Ordered Stop   09/19/19 2000  ceFAZolin (ANCEF) IVPB 2g/100 mL premix     2 g 200 mL/hr over 30 Minutes Intravenous Every 6 hours 09/19/19 1713 09/20/19 0237   09/19/19 0600  ceFAZolin (ANCEF) IVPB 2g/100 mL premix     2 g 200 mL/hr over 30 Minutes Intravenous To [facility] 09/18/19 1925 09/19/19 1351       I have personally reviewed the following labs and images: CBC: Recent Labs  Lab 09/17/19 1547 09/18/19 0507 09/20/19 0258 09/22/19 0429 09/23/19 0252 09/24/19 0339  WBC 8.2 4.7 6.9  --   --   --   NEUTROABS 7.2 4.2  --   --   --   --   HGB 13.0 12.8 11.3* 10.1* 11.2* 10.9*  HCT 41.0 40.5 36.0 31.2* 35.2* 34.0*  MCV 103.0* 102.0* 103.4*  --   --   --   PLT 198 210 209  --   --   --    BMP &GFR Recent Labs  Lab 09/17/19  1547 09/18/19 0507 09/20/19 0258 09/21/19 0308 09/22/19 0429  NA 139 141 139 140 137  K 3.6 4.3 4.9 4.1 4.3  CL 98 98 99 100 99  CO2 31 33* 32 32 32  GLUCOSE 109* 147* 156* 92 91  BUN 12 15 15 19 12   CREATININE 0.83 0.71 0.96 1.03* 0.71  CALCIUM 9.3 9.4 9.0 9.0 8.7*  MG  --   --   --  1.6* 2.0   Estimated Creatinine Clearance: 41.2 mL/min (by C-G formula based on SCr of 0.71 mg/dL). Liver & Pancreas: Recent Labs  Lab 09/21/19 0308 09/22/19 0429  AST 19 19  ALT 8 8  ALKPHOS 88 94  BILITOT 0.9 0.8  PROT 5.4* 5.3*  ALBUMIN 2.5* 2.6*   No results for input(s): LIPASE, AMYLASE in the last 168 hours. No results for input(s): AMMONIA in the last 168 hours. Diabetic: No results for input(s): HGBA1C in the last 72 hours. No results for input(s): GLUCAP in  the last 168 hours. Cardiac Enzymes: No results for input(s): CKTOTAL, CKMB, CKMBINDEX, TROPONINI in the last 168 hours. No results for input(s): PROBNP in the last 8760 hours. Coagulation Profile: Recent Labs  Lab 09/17/19 1547  INR 1.0   Thyroid Function Tests: No results for input(s): TSH, T4TOTAL, FREET4, T3FREE, THYROIDAB in the last 72 hours. Lipid Profile: No results for input(s): CHOL, HDL, LDLCALC, TRIG, CHOLHDL, LDLDIRECT in the last 72 hours. Anemia Panel: Recent Labs    09/22/19 1127  VITAMINB12 149*  FOLATE 7.3  FERRITIN 122  TIBC 244*  IRON 60  RETICCTPCT 1.3   Urine analysis:    Component Value Date/Time   COLORURINE YELLOW 03/25/2019 1007   APPEARANCEUR CLEAR 03/25/2019 1007   LABSPEC 1.012 03/25/2019 1007   PHURINE 6.0 03/25/2019 [place] 03/25/2019 1007   GLUCOSEU NEGATIVE 07/06/2009 1057   HGBUR NEGATIVE 03/25/2019 [place] 03/25/2019 [place] 03/25/2019 1007   PROTEINUR NEGATIVE 03/25/2019 1007   UROBILINOGEN 0.2 07/06/2009 1057   NITRITE NEGATIVE 03/25/2019 1007   LEUKOCYTESUR NEGATIVE 03/25/2019 1007   Sepsis Labs: Invalid  input(s): PROCALCITONIN, [place]  Microbiology: Recent Results (from the past 240 hour(s))  SARS CORONAVIRUS 2 (TAT 6-24 HRS) Nasopharyngeal Nasopharyngeal Swab     Status: None   Collection Time: 09/17/19  7:19 PM   Specimen: Nasopharyngeal Swab  Result Value Ref Range Status   SARS Coronavirus 2 NEGATIVE NEGATIVE Final    Comment: (NOTE) SARS-CoV-2 target nucleic acids are NOT DETECTED. The SARS-CoV-2 RNA is generally detectable in upper and lower respiratory specimens during the acute phase of infection. Negative results do not preclude SARS-CoV-2 infection, do not rule out co-infections with other pathogens, and should not be used as the sole basis for treatment or other patient management decisions. Negative results must be combined with clinical observations, patient history, and epidemiological information. The expected result is Negative. Fact Sheet for Patients: SugarRoll.be Fact Sheet for Healthcare Providers: https://www.woods-mathews.com/ This test is not yet approved or cleared by the [place] FDA and  has been authorized for detection and/or diagnosis of SARS-CoV-2 by FDA under an Emergency Use Authorization (EUA). This EUA will remain  in effect (meaning this test can be used) for the duration of the COVID-19 declaration under Section 56 4(b)(1) of the Act, 21 U.S.C. section 360bbb-3(b)(1), unless the authorization is terminated or revoked sooner. Performed at [facility], [place] [address] [place], [place] [address]   Surgical pcr screen     Status: None   Collection Time: 09/18/19  2:37 PM   Specimen: Nasal Mucosa; Nasal Swab  Result Value Ref Range Status   MRSA, PCR NEGATIVE NEGATIVE Final   Staphylococcus aureus NEGATIVE NEGATIVE Final    Comment: (NOTE) The Xpert SA Assay (FDA approved for NASAL specimens in patients 61 years of age and older), is one component of a comprehensive surveillance  program. It is not intended to diagnose infection nor to guide or monitor treatment. Performed at [facility], [place] [address], [place], [name] [address]   SARS CORONAVIRUS 2 (TAT 6-24 HRS) Nasopharyngeal Nasopharyngeal Swab     Status: None   Collection Time: 09/23/19  9:48 AM   Specimen: Nasopharyngeal Swab  Result Value Ref Range Status   SARS Coronavirus 2 NEGATIVE NEGATIVE Final    Comment: (NOTE) SARS-CoV-2 target nucleic acids are NOT DETECTED. The SARS-CoV-2 RNA is generally detectable in upper and lower respiratory specimens during the acute  phase of infection. Negative results do not preclude SARS-CoV-2 infection, do not rule out co-infections with other pathogens, and should not be used as the sole basis for treatment or other patient management decisions. Negative results must be combined with clinical observations, patient history, and epidemiological information. The expected result is Negative. Fact Sheet for Patients: SugarRoll.be Fact Sheet for Healthcare Providers: https://www.woods-mathews.com/ This test is not yet approved or cleared by the [place] FDA and  has been authorized for detection and/or diagnosis of SARS-CoV-2 by FDA under an Emergency Use Authorization (EUA). This EUA will remain  in effect (meaning this test can be used) for the duration of the COVID-19 declaration under Section 56 4(b)(1) of the Act, 21 U.S.C. section 360bbb-3(b)(1), unless the authorization is terminated or revoked sooner. Performed at [facility], [place] [address], [place], [place] [address]     Radiology Studies: No results found.   Skeeter Sheard T. [place]  If 7PM-7AM, please contact night-coverage [email or web address] Password [email or web address] 09/24/2019, 2:01 PM

## 2019-09-24 NOTE — Plan of Care (Signed)

## 2019-09-24 NOTE — TOC Progression Note (Signed)
Transition of Care [facility]) - Progression Note    Patient Details  Name: Jessica [name] MRN: [id number] Date of Birth: 1926-03-14  Transition of Care [facility]) CM/SW [place], [place] Phone Number: 09/24/2019, 10:29 AM  Clinical Narrative:    [place] offices remain closed on weekend. TOC team will f/u for discharge to [place] per pt preference on Monday if PASRR obtained.   Pt will need COVID 24-48 hrs prior to dc.    Expected Discharge Plan: [place] Barriers to Discharge: [name])  Expected Discharge Plan and Services Expected Discharge Plan: [place] In-house Referral: NA Discharge Planning Services: CM Consult Post Acute Care Choice: [place] Living arrangements for the past 2 months: Single Family Home Expected Discharge Date: 09/21/19               DME Arranged: N/A DME Agency: NA       HH Arranged: NA HH Agency: NA         Social Determinants of Health (SDOH) Interventions    Readmission Risk Interventions Readmission Risk Prevention Plan 04/01/2019  Post Dischage Appt Complete  Medication Screening Complete  Transportation Screening Complete  Some recent data might be hidden

## 2019-09-25 NOTE — Plan of Care (Signed)
  Problem: Education: Goal: Knowledge of General Education information will improve Description Including pain rating scale, medication(s)/side effects and non-pharmacologic comfort measures Outcome: Progressing   

## 2019-09-25 NOTE — Progress Notes (Signed)
PROGRESS NOTE  Jessica [name] [id number] DOB: [birth year]   PCP: [name], [name], MD  Patient is from: Home.  Lives with her daughter.  DOA: 09/17/2019 LOS: 7  Brief Narrative / Interim history: 85 year old with history of severe AI status post replacement, COPD with chronic hypoxia, fibromyalgia, GAD presented with severe right-sided hip pain after a fall about 10 days prior to the admission.  CT was negative for acute fracture but MRI was suggestive of proximal right hip insufficiency fracture with small effusion, moderate osteoarthritis, bursitis.  She underwent intramedullary nailing on 09/19/2019.  Evaluated by PT/OT who recommended home health with 24-hour supervision.  However, family not able to provide 24-hour supervision.  CSW consulted for SNF. Waiting on PASRR.  Subjective: No major events overnight of this morning.  No complaint this morning.  Denies chest pain, dyspnea, nausea, vomiting, abdominal pain or UTI symptoms.  Knee pain resolved.  Objective: Vitals:   09/25/19 0502 09/25/19 0700 09/25/19 0816 09/25/19 0819  BP: 138/68 (!) 109/53    Pulse: 83 92    Resp: 16     Temp: 98.2 F (36.8 C) 97.8 F (36.6 C)    TempSrc: Oral Oral    SpO2: 92% 94% 97% 95%  Weight:      Height:        Intake/Output Summary (Last 24 hours) at 09/25/2019 1213 Last data filed at 09/24/2019 2200 Gross per 24 hour  Intake 240 ml  Output 700 ml  Net -460 ml   Filed Weights   09/19/19 1804  Weight: 59 kg    Examination:  GENERAL: No acute distress.  Sitting in bed. HEENT: MMM.  Vision and hearing grossly intact.  NECK: Supple.  No apparent JVD.  RESP:  No IWOB. Good air movement bilaterally. CVS:  RRR. Heart sounds normal.  ABD/GI/GU: Bowel sounds present. Soft. Non tender.  MSK/EXT:  Moves extremities.  No swelling, tenderness or erythema over both knees.  Dressing over RLE SKIN: Ulcerated seborrheic keratosis over his back.  Dressing over RLE. NEURO: Awake, alert  and oriented fairly.  No apparent focal neuro deficit. PSYCH: Calm. Normal affect.  GENERAL: No acute distress.  HEENT: MMM.  Vision and hearing grossly intact.  NECK: Supple.  No apparent JVD.  RESP:  No IWOB. Good air movement bilaterally. CVS:  RRR. Heart sounds normal.  ABD/GI/GU: Bowel sounds present. Soft. Non tender.  MSK/EXT:  Moves extremities. No apparent deformity or edema.  SKIN: Dressing over surgical site of RLE NEURO: Awake, alert and oriented fairly.  No apparent focal neuro deficit. PSYCH: Calm. Normal affect.  Assessment & Plan: Acute right hip pain secondary to proximal right femoral intertrochanteric insufficiency fracture Right hip osteoarthritis and peritrochanteric bursitis Vitamin D deficiency -Underwent intramedullary nailing on 09/19/2019 by Dr. Percell Miller -Pain management with bowel regimen -Vitamin D 50,000 units weekly started 09/21/2019 -Daily calcium/vitamin D -Per Ortho, WBAT, dressing until follow-up and subcu Lovenox for 4 weeks -PT/OT  Chronic COPD: Stable.  On room air. -Continue home bronchodilators  History of CAD/AI s/p AVR/chronic diastolic CHF: No cardiopulmonary symptoms.  Good urine output off diuretics.  Appears euvolemic. -Continue home statin and aspirin. -Home Lasix on hold-she may not need this going forward. -Monitor fluid status  AKI: Creatinine peaked at 1.03.  Now back to baseline. -Continue monitoring  Acute blood loss/macrocytic anemia: Hgb 12.8 (admit)>> 10.1>11.2-likely due to blood draws. No melena or hematochezia.  Anemia panel consistent with anemia of chronic disease. -Monitor H&H intermittently  GAD/insomnia/fibromyalgia: Stable -Continue  home Xanax and Lexapro  Bilateral knee pain: Improved. -Continue Voltaren gel  Pressure Injury 09/18/19 Coccyx Mid;Lower Stage I -  Intact skin with non-blanchable redness of a localized area usually over a bony prominence. (Active)  09/18/19 0940  Location: Coccyx  Location  Orientation: Mid;Lower  Staging: Stage I -  Intact skin with non-blanchable redness of a localized area usually over a bony prominence.  Wound Description (Comments):   Present on Admission: Yes   Ulcerated seborrheic dermatitis over her back -Continue daily dressing              DVT prophylaxis: Subcu Lovenox Code Status: Full code Family Communication: Updated patient's daughter over the phone 11/25. Disposition Plan: Remains inpatient.  Final disposition SNF in the next 24 to 48 hours Consultants: Orthopedic surgery  Procedures:  11/23-intramedullary nailing of right hip by Dr. Percell Miller  Microbiology summarized: COVID-19 screen negative MRSA/staph PCR negative   Sch Meds:  Scheduled Meds: . ALPRAZolam  0.5 mg Oral QHS  . arformoterol  15 mcg Nebulization BID  . aspirin EC  81 mg Oral Daily  . budesonide  0.5 mg Nebulization BID  . calcium-vitamin D  2 tablet Oral Q breakfast  . diclofenac Sodium  2 g Topical QID  . docusate sodium  100 mg Oral BID  . enoxaparin (LOVENOX) injection  40 mg Subcutaneous Q24H  . escitalopram  20 mg Oral q morning - 10a  . lidocaine  1 patch Transdermal Q24H  . loratadine  10 mg Oral Daily  . pantoprazole  40 mg Oral Q1200  . simvastatin  20 mg Oral q1800  . vitamin B-12  1,000 mcg Oral Daily  . Vitamin D (Ergocalciferol)  50,000 Units Oral Q7 days   Continuous Infusions: . lactated ringers 50 mL/hr at 09/20/19 0300   PRN Meds:.acetaminophen, albuterol, HYDROcodone-acetaminophen, morphine injection, ondansetron **OR** ondansetron (ZOFRAN) IV, polyethylene glycol, senna-docusate  Antimicrobials: Anti-infectives (From admission, onward)   Start     Dose/Rate Route Frequency Ordered Stop   09/19/19 2000  ceFAZolin (ANCEF) IVPB 2g/100 mL premix     2 g 200 mL/hr over 30 Minutes Intravenous Every 6 hours 09/19/19 1713 09/20/19 0237   09/19/19 0600  ceFAZolin (ANCEF) IVPB 2g/100 mL premix     2 g 200 mL/hr over 30 Minutes Intravenous  To [facility] 09/18/19 1925 09/19/19 1351       I have personally reviewed the following labs and images: CBC: Recent Labs  Lab 09/20/19 0258 09/22/19 0429 09/23/19 0252 09/24/19 0339  WBC 6.9  --   --   --   HGB 11.3* 10.1* 11.2* 10.9*  HCT 36.0 31.2* 35.2* 34.0*  MCV 103.4*  --   --   --   PLT 209  --   --   --    BMP &GFR Recent Labs  Lab 09/20/19 0258 09/21/19 0308 09/22/19 0429  NA 139 140 137  K 4.9 4.1 4.3  CL 99 100 99  CO2 32 32 32  GLUCOSE 156* 92 91  BUN 15 19 12   CREATININE 0.96 1.03* 0.71  CALCIUM 9.0 9.0 8.7*  MG  --  1.6* 2.0   Estimated Creatinine Clearance: 41.2 mL/min (by C-G formula based on SCr of 0.71 mg/dL). Liver & Pancreas: Recent Labs  Lab 09/21/19 0308 09/22/19 0429  AST 19 19  ALT 8 8  ALKPHOS 88 94  BILITOT 0.9 0.8  PROT 5.4* 5.3*  ALBUMIN 2.5* 2.6*   No results for input(s): LIPASE, AMYLASE  in the last 168 hours. No results for input(s): AMMONIA in the last 168 hours. Diabetic: No results for input(s): HGBA1C in the last 72 hours. No results for input(s): GLUCAP in the last 168 hours. Cardiac Enzymes: No results for input(s): CKTOTAL, CKMB, CKMBINDEX, TROPONINI in the last 168 hours. No results for input(s): PROBNP in the last 8760 hours. Coagulation Profile: No results for input(s): INR, PROTIME in the last 168 hours. Thyroid Function Tests: No results for input(s): TSH, T4TOTAL, FREET4, T3FREE, THYROIDAB in the last 72 hours. Lipid Profile: No results for input(s): CHOL, HDL, LDLCALC, TRIG, CHOLHDL, LDLDIRECT in the last 72 hours. Anemia Panel: No results for input(s): VITAMINB12, FOLATE, FERRITIN, TIBC, IRON, RETICCTPCT in the last 72 hours. Urine analysis:    Component Value Date/Time   COLORURINE YELLOW 03/25/2019 1007   APPEARANCEUR CLEAR 03/25/2019 1007   LABSPEC 1.012 03/25/2019 1007   PHURINE 6.0 03/25/2019 [place] 03/25/2019 [place] 07/06/2009 [place]  03/25/2019 [place] 03/25/2019 [place] 03/25/2019 1007   PROTEINUR NEGATIVE 03/25/2019 1007   UROBILINOGEN 0.2 07/06/2009 1057   NITRITE NEGATIVE 03/25/2019 1007   LEUKOCYTESUR NEGATIVE 03/25/2019 1007   Sepsis Labs: Invalid input(s): PROCALCITONIN, [place]  Microbiology: Recent Results (from the past 240 hour(s))  SARS CORONAVIRUS 2 (TAT 6-24 HRS) Nasopharyngeal Nasopharyngeal Swab     Status: None   Collection Time: 09/17/19  7:19 PM   Specimen: Nasopharyngeal Swab  Result Value Ref Range Status   SARS Coronavirus 2 NEGATIVE NEGATIVE Final    Comment: (NOTE) SARS-CoV-2 target nucleic acids are NOT DETECTED. The SARS-CoV-2 RNA is generally detectable in upper and lower respiratory specimens during the acute phase of infection. Negative results do not preclude SARS-CoV-2 infection, do not rule out co-infections with other pathogens, and should not be used as the sole basis for treatment or other patient management decisions. Negative results must be combined with clinical observations, patient history, and epidemiological information. The expected result is Negative. Fact Sheet for Patients: SugarRoll.be Fact Sheet for Healthcare Providers: https://www.woods-mathews.com/ This test is not yet approved or cleared by the [place] FDA and  has been authorized for detection and/or diagnosis of SARS-CoV-2 by FDA under an Emergency Use Authorization (EUA). This EUA will remain  in effect (meaning this test can be used) for the duration of the COVID-19 declaration under Section 56 4(b)(1) of the Act, 21 U.S.C. section 360bbb-3(b)(1), unless the authorization is terminated or revoked sooner. Performed at [facility], [place] [address], [place], [place] [address]   Surgical pcr screen     Status: None   Collection Time: 09/18/19  2:37 PM   Specimen: Nasal Mucosa; Nasal Swab  Result Value Ref  Range Status   MRSA, PCR NEGATIVE NEGATIVE Final   Staphylococcus aureus NEGATIVE NEGATIVE Final    Comment: (NOTE) The Xpert SA Assay (FDA approved for NASAL specimens in patients 5 years of age and older), is one component of a comprehensive surveillance program. It is not intended to diagnose infection nor to guide or monitor treatment. Performed at [facility], [place] [address] [place], [name] [address]   SARS CORONAVIRUS 2 (TAT 6-24 HRS) Nasopharyngeal Nasopharyngeal Swab     Status: None   Collection Time: 09/23/19  9:48 AM   Specimen: Nasopharyngeal Swab  Result Value Ref Range Status   SARS Coronavirus 2 NEGATIVE NEGATIVE Final    Comment: (NOTE) SARS-CoV-2 target  nucleic acids are NOT DETECTED. The SARS-CoV-2 RNA is generally detectable in upper and lower respiratory specimens during the acute phase of infection. Negative results do not preclude SARS-CoV-2 infection, do not rule out co-infections with other pathogens, and should not be used as the sole basis for treatment or other patient management decisions. Negative results must be combined with clinical observations, patient history, and epidemiological information. The expected result is Negative. Fact Sheet for Patients: SugarRoll.be Fact Sheet for Healthcare Providers: https://www.woods-mathews.com/ This test is not yet approved or cleared by the [place] FDA and  has been authorized for detection and/or diagnosis of SARS-CoV-2 by FDA under an Emergency Use Authorization (EUA). This EUA will remain  in effect (meaning this test can be used) for the duration of the COVID-19 declaration under Section 56 4(b)(1) of the Act, 21 U.S.C. section 360bbb-3(b)(1), unless the authorization is terminated or revoked sooner. Performed at [facility], [place] [address] [place], [place] [address]     Radiology Studies: No results found.   [place] Dettore T. [place]  If 7PM-7AM, please contact night-coverage [email or web address] Password [facility] 09/25/2019, 12:13 PM

## 2019-09-25 NOTE — Plan of Care (Signed)

## 2019-09-26 DIAGNOSIS — K5900 Constipation, unspecified: Secondary | ICD-10-CM | POA: Diagnosis not present

## 2019-09-26 DIAGNOSIS — E559 Vitamin D deficiency, unspecified: Secondary | ICD-10-CM

## 2019-09-26 DIAGNOSIS — I5032 Chronic diastolic (congestive) heart failure: Secondary | ICD-10-CM | POA: Diagnosis not present

## 2019-09-26 DIAGNOSIS — Z952 Presence of prosthetic heart valve: Secondary | ICD-10-CM | POA: Diagnosis not present

## 2019-09-26 DIAGNOSIS — M1611 Unilateral primary osteoarthritis, right hip: Secondary | ICD-10-CM | POA: Diagnosis not present

## 2019-09-26 DIAGNOSIS — Z5189 Encounter for other specified aftercare: Secondary | ICD-10-CM | POA: Diagnosis not present

## 2019-09-26 DIAGNOSIS — S72144A Nondisplaced intertrochanteric fracture of right femur, initial encounter for closed fracture: Secondary | ICD-10-CM | POA: Diagnosis not present

## 2019-09-26 DIAGNOSIS — R21 Rash and other nonspecific skin eruption: Secondary | ICD-10-CM | POA: Diagnosis not present

## 2019-09-26 DIAGNOSIS — G252 Other specified forms of tremor: Secondary | ICD-10-CM | POA: Diagnosis not present

## 2019-09-26 DIAGNOSIS — I350 Nonrheumatic aortic (valve) stenosis: Secondary | ICD-10-CM | POA: Diagnosis not present

## 2019-09-26 DIAGNOSIS — M25551 Pain in right hip: Secondary | ICD-10-CM | POA: Diagnosis not present

## 2019-09-26 DIAGNOSIS — I2510 Atherosclerotic heart disease of native coronary artery without angina pectoris: Secondary | ICD-10-CM | POA: Diagnosis not present

## 2019-09-26 DIAGNOSIS — Z7901 Long term (current) use of anticoagulants: Secondary | ICD-10-CM | POA: Diagnosis not present

## 2019-09-26 DIAGNOSIS — Z86711 Personal history of pulmonary embolism: Secondary | ICD-10-CM | POA: Diagnosis not present

## 2019-09-26 DIAGNOSIS — R0602 Shortness of breath: Secondary | ICD-10-CM | POA: Diagnosis not present

## 2019-09-26 DIAGNOSIS — I700 Atherosclerosis of aorta: Secondary | ICD-10-CM | POA: Diagnosis not present

## 2019-09-26 DIAGNOSIS — F419 Anxiety disorder, unspecified: Secondary | ICD-10-CM | POA: Diagnosis not present

## 2019-09-26 DIAGNOSIS — F411 Generalized anxiety disorder: Secondary | ICD-10-CM | POA: Diagnosis not present

## 2019-09-26 DIAGNOSIS — F329 Major depressive disorder, single episode, unspecified: Secondary | ICD-10-CM | POA: Diagnosis not present

## 2019-09-26 DIAGNOSIS — G4700 Insomnia, unspecified: Secondary | ICD-10-CM | POA: Diagnosis not present

## 2019-09-26 DIAGNOSIS — J449 Chronic obstructive pulmonary disease, unspecified: Secondary | ICD-10-CM | POA: Diagnosis not present

## 2019-09-26 DIAGNOSIS — J9610 Chronic respiratory failure, unspecified whether with hypoxia or hypercapnia: Secondary | ICD-10-CM | POA: Diagnosis not present

## 2019-09-26 DIAGNOSIS — L89899 Pressure ulcer of other site, unspecified stage: Secondary | ICD-10-CM | POA: Diagnosis not present

## 2019-09-26 DIAGNOSIS — M7061 Trochanteric bursitis, right hip: Secondary | ICD-10-CM | POA: Diagnosis not present

## 2019-09-26 DIAGNOSIS — S72141D Displaced intertrochanteric fracture of right femur, subsequent encounter for closed fracture with routine healing: Secondary | ICD-10-CM | POA: Diagnosis not present

## 2019-09-26 DIAGNOSIS — Z4789 Encounter for other orthopedic aftercare: Secondary | ICD-10-CM | POA: Diagnosis not present

## 2019-09-26 LAB — SARS CORONAVIRUS 2 (TAT 6-24 HRS): SARS Coronavirus 2: NEGATIVE

## 2019-09-26 MED ORDER — CLONAZEPAM 0.5 MG PO TABS: 0.5000 mg | ORAL_TABLET | Freq: Two times a day (BID) | ORAL | 0 refills | Status: DC

## 2019-09-26 MED ORDER — FUROSEMIDE 20 MG PO TABS: 20.0000 mg | ORAL_TABLET | Freq: Every day | ORAL | 3 refills | Status: DC | PRN

## 2019-09-26 NOTE — Progress Notes (Signed)
RN called to give report to SNF, all questions were answered.  Ameris Akamine Elon Spanner, RN 09/26/2019 1:31 PM

## 2019-09-26 NOTE — Discharge Summary (Signed)
Physician Discharge Summary  Jessica [name] [facility] [id number] DOB: June 22, 1926 DOA: 09/17/2019  PCP: [name], [name], MD  Admit date: 09/17/2019 Discharge date: 09/26/2019  Admitted From: Home Disposition: SNF  Recommendations for Outpatient Follow-up:  1. Follow up with PCP and orthopedic surgery as below. 2. Please obtain CBC/BMP/Mag at follow up 3. Please follow up on the following pending results: None  Home Health: Not applicable Equipment/Devices: Not applicable  Discharge Condition: Stable CODE STATUS: Full code  Follow-up Information    Renette Butters, MD In 2 weeks.   Specialty: Orthopedic Surgery Contact information: [address] [place] [id number] [phone]        [facility] Preferred SNF Follow up.   Specialty: [place] Why: rehab Contact information: [place] [facility] Course: 85 year old with history of severe AI status post replacement, COPD with chronic hypoxia, fibromyalgia, GAD presented with severe right-sided hip pain after a fall about 10 days prior to the admission. CT was negative for acute fracture but MRI was suggestive of proximal right hip insufficiency fracture with small effusion, moderate osteoarthritis, bursitis.  She underwent intramedullary nailing on 09/19/2019.  Evaluated by PT/OT who recommended home health with 24-hour supervision.  However, family not able to provide 24-hour supervision.    Patient discharged to SNF in stable condition.  See individual problem list below for more on hospital course.  Discharge Diagnoses:  Acute right hip pain secondary to proximal right femoral intertrochanteric insufficiency fracture Right hip osteoarthritis and peritrochanteric bursitis Vitamin D deficiency -Underwent intramedullary nailing on 09/19/2019 by Dr. Percell Miller -Pain management with bowel  regimen -Vitamin D 50,000 units weekly started 09/21/2019 -Daily calcium/vitamin D -Per Ortho, WBAT, dressing until follow-up and subcu Lovenox for 4 weeks -Ongoing PT/OT at SNF.  Chronic COPD: Stable.  On room air. -Continue home bronchodilators  History of CAD/AI s/p AVR/chronic diastolic CHF: No cardiopulmonary symptoms.  Good urine output off diuretics.  Appears euvolemic. -Continue home statin and aspirin. -Lasix 20 mg as needed for fluid, edema, shortness of breath or weight gain. -Monitor fluid status -Check BMP and magnesium at follow-up.  AKI: Creatinine peaked at 1.03.  Now back to baseline. -Check BMP at follow-up.  Acute blood loss/macrocytic anemia: Hgb 12.8 (admit)>> 10.1>11.2-likely due to blood draws. No melena or hematochezia.  Anemia panel consistent with anemia of chronic disease. -Check CBC at follow-up.  GAD/insomnia/fibromyalgia: Stable -Changed Xanax to Klonopin-less withdrawal symptoms -Continue home Lexapro.  Bilateral knee pain:  Resolved. -As needed Tylenol  Pressure Injury 09/18/19 Coccyx Mid;Lower Stage I -  Intact skin with non-blanchable redness of a localized area usually over a bony prominence. (Active)  09/18/19 0940  Location: Coccyx  Location Orientation: Mid;Lower  Staging: Stage I -  Intact skin with non-blanchable redness of a localized area usually over a bony prominence.  Wound Description (Comments):   Present on Admission: Yes   Ulcerated seborrheic dermatitis over her back -Continue daily dressing      Discharge Instructions  Discharge Instructions    Call MD for:  redness, tenderness, or signs of infection (pain, swelling, redness, odor or green/yellow discharge around incision site)   Complete by: As directed    Call MD for:  severe uncontrolled pain   Complete by: As directed    Call MD for:  temperature >100.4   Complete by: As directed    Diet - low sodium heart  healthy   Complete by: As directed    Diet -  low sodium heart healthy   Complete by: As directed    Discharge instructions   Complete by: As directed    It has been a pleasure taking care of you! You were hospitalized due to fall and right hip fracture.  You were treated surgically.  We are discharging you home with home health physical therapy and occupational therapy for therapeutic exercise.  Please follow-up with your orthopedic surgeon as recommended under the follow-up section. Please review your new medication list and the directions before you take your medications.   Take care,   Increase activity slowly   Complete by: As directed    Increase activity slowly   Complete by: As directed      Allergies as of 09/26/2019      Reactions   Contrast Media [iodinated Diagnostic Agents] Itching, Rash, Other (See Comments)   Hypotension, Skin turns red like a sunburn   Iohexol Itching, Rash, Other (See Comments)   Hypotension, Skin turns red like a sunburn      Medication List    STOP taking these medications   ALPRAZolam 0.5 MG tablet Commonly known as: XANAX   guaiFENesin 600 MG 12 hr tablet Commonly known as: MUCINEX   pantoprazole 40 MG tablet Commonly known as: PROTONIX     TAKE these medications   acetaminophen 500 MG tablet Commonly known as: TYLENOL Take 2 tablets (1,000 mg total) by mouth every 8 (eight) hours as needed for mild pain, moderate pain or headache (pain). What changed:   when to take this  reasons to take this   albuterol (2.5 MG/3ML) 0.083% nebulizer solution Commonly known as: PROVENTIL Take 3 mLs (2.5 mg total) by nebulization every 4 (four) hours as needed for wheezing or shortness of breath. DX: COPD J44.9   albuterol 108 (90 Base) MCG/ACT inhaler Commonly known as: Ventolin HFA Inhale 2 puffs into the lungs every 6 (six) hours as needed for wheezing.   AMBULATORY NON FORMULARY MEDICATION Medication Name: Incentive spirometer Use 10-15 times per day   arformoterol 15 MCG/2ML  [place] Commonly known as: BROVANA Take 2 mLs (15 mcg total) by nebulization 2 (two) times daily. DX: COPD J44.9   aspirin EC 81 MG tablet Take 1 tablet (81 mg total) by mouth daily. What changed: when to take this   budesonide 0.5 MG/2ML nebulizer solution Commonly known as: PULMICORT Take 2 mLs (0.5 mg total) by nebulization 2 (two) times daily. DX: COPD J44.9   calcium-vitamin D 500-200 MG-UNIT tablet Commonly known as: OSCAL WITH D Take 2 tablets by mouth daily with breakfast.   clonazePAM 0.5 MG tablet Commonly known as: KlonoPIN Take 1 tablet (0.5 mg total) by mouth 2 (two) times daily.   enoxaparin 40 MG/0.4ML injection Commonly known as: LOVENOX Inject 0.4 mLs (40 mg total) into the skin daily for 30 doses. For 30 days post op for DVT prophylaxis   escitalopram 20 MG tablet Commonly known as: LEXAPRO Take 1 tablet (20 mg total) by mouth daily. What changed: when to take this   ferrous sulfate 325 (65 FE) MG tablet Take 1 tablet (325 mg total) by mouth daily.   fluticasone 50 MCG/ACT nasal spray Commonly known as: FLONASE Place 2 sprays into both nostrils daily. What changed:   when to take this  reasons to take this   Flutter Devi 1 each by Does not apply route daily.   furosemide 20 MG tablet  Commonly known as: Lasix Take 1 tablet (20 mg total) by mouth daily as needed for fluid or edema (sob or weight gain). What changed:   when to take this  reasons to take this   HYDROcodone-acetaminophen 5-325 MG tablet Commonly known as: Norco Take 0.5-1 tablets by mouth every 6 (six) hours as needed for severe pain.   loratadine 10 MG tablet Commonly known as: CLARITIN Take 1 tablet (10 mg total) by mouth daily.   polyethylene glycol 17 g packet Commonly known as: MIRALAX / GLYCOLAX Take 17 g by mouth daily as needed for mild constipation.   senna-docusate 8.6-50 MG tablet Commonly known as: Senokot-S Take 2 tablets by mouth 2 (two) times daily as needed  for mild constipation or moderate constipation.   simvastatin 20 MG tablet Commonly known as: ZOCOR Take 1 tablet (20 mg total) by mouth at bedtime.   triamcinolone cream 0.1 % Commonly known as: KENALOG Apply 1 application topically 2 (two) times daily as needed (rash).   Vitamin D (Ergocalciferol) 1.25 MG (50000 UT) Caps capsule Commonly known as: DRISDOL Take 1 capsule (50,000 Units total) by mouth every 7 (seven) days. Start taking on: September 28, 2019            Durable Medical Equipment  (From admission, onward)         Start     Ordered   09/20/19 1332  For home use only DME Bedside commode  Once    Question:  Patient needs a bedside commode to treat with the following condition  Answer:  Physical deconditioning   09/20/19 1332          Consultations:  Orthopedic surgery  Procedures/Studies:  2D Echo: None this admission  Underwent intramedullary nailing on 09/19/2019 by Dr. Percell Miller  Ct Head Wo Contrast  Result Date: 09/17/2019 CLINICAL DATA:  Minor head trauma. High clinical risk. EXAM: CT HEAD WITHOUT CONTRAST CT CERVICAL SPINE WITHOUT CONTRAST TECHNIQUE: Multidetector CT imaging of the head and cervical spine was performed following the standard protocol without intravenous contrast. Multiplanar CT image reconstructions of the cervical spine were also generated. COMPARISON:  09/09/2007 MRI brain. FINDINGS: CT HEAD FINDINGS Brain: Tiny chronic appearing right basal ganglia lacune. No evidence of parenchymal hemorrhage or extra-axial fluid collection. No mass lesion, mass effect, or midline shift. No CT evidence of acute infarction. Generalized cerebral volume loss. Nonspecific mild subcortical and periventricular white matter hypodensity, most in keeping with chronic small vessel ischemic change. No ventriculomegaly. Vascular: No acute abnormality. Skull: No evidence of calvarial fracture. Sinuses/Orbits: The visualized paranasal sinuses are essentially clear.  Other:  The mastoid air cells are unopacified. CT CERVICAL SPINE FINDINGS Alignment: Normal cervical lordosis. No facet subluxation. Dens is well positioned between the lateral masses of C1. Skull base and vertebrae: No acute fracture. No primary bone lesion or focal pathologic process. Healed deformity in lateral right clavicle, partially visualized. Soft tissues and spinal canal: No prevertebral edema. No visible canal hematoma. Disc levels: Status post ACDF C3-4 with no evidence of hardware fracture or loosening. Ankylosis of the C3-4 disc. Marked degenerative disc disease at C4-5, C5-6 and C6-7. Mild-to-moderate bilateral cervical facet arthropathy. Mild degenerative foraminal stenosis on the left at C3-4. Moderate degenerative foraminal stenosis bilaterally at C4-5. Mild degenerative foraminal stenosis bilaterally at C5-6 and C6-7. Upper chest: No acute abnormality. Other: Visualized mastoid air cells appear clear. No discrete thyroid nodules. No pathologically enlarged cervical nodes. IMPRESSION: 1. No evidence of acute intracranial abnormality. No evidence of  calvarial fracture. 2. Generalized cerebral volume loss and mild chronic small vessel ischemic changes in the cerebral white matter. Tiny chronic appearing right basal ganglia lacune. 3. No cervical spine fracture or subluxation. 4. Status post C3-4 ACDF with no evidence of hardware complication. 5. Marked degenerative changes in the cervical spine as detailed. Electronically Signed   By: Ilona Sorrel M.D.   On: 09/17/2019 16:20   Ct Cervical Spine Wo Contrast  Result Date: 09/17/2019 CLINICAL DATA:  Minor head trauma. High clinical risk. EXAM: CT HEAD WITHOUT CONTRAST CT CERVICAL SPINE WITHOUT CONTRAST TECHNIQUE: Multidetector CT imaging of the head and cervical spine was performed following the standard protocol without intravenous contrast. Multiplanar CT image reconstructions of the cervical spine were also generated. COMPARISON:  09/09/2007 MRI  brain. FINDINGS: CT HEAD FINDINGS Brain: Tiny chronic appearing right basal ganglia lacune. No evidence of parenchymal hemorrhage or extra-axial fluid collection. No mass lesion, mass effect, or midline shift. No CT evidence of acute infarction. Generalized cerebral volume loss. Nonspecific mild subcortical and periventricular white matter hypodensity, most in keeping with chronic small vessel ischemic change. No ventriculomegaly. Vascular: No acute abnormality. Skull: No evidence of calvarial fracture. Sinuses/Orbits: The visualized paranasal sinuses are essentially clear. Other:  The mastoid air cells are unopacified. CT CERVICAL SPINE FINDINGS Alignment: Normal cervical lordosis. No facet subluxation. Dens is well positioned between the lateral masses of C1. Skull base and vertebrae: No acute fracture. No primary bone lesion or focal pathologic process. Healed deformity in lateral right clavicle, partially visualized. Soft tissues and spinal canal: No prevertebral edema. No visible canal hematoma. Disc levels: Status post ACDF C3-4 with no evidence of hardware fracture or loosening. Ankylosis of the C3-4 disc. Marked degenerative disc disease at C4-5, C5-6 and C6-7. Mild-to-moderate bilateral cervical facet arthropathy. Mild degenerative foraminal stenosis on the left at C3-4. Moderate degenerative foraminal stenosis bilaterally at C4-5. Mild degenerative foraminal stenosis bilaterally at C5-6 and C6-7. Upper chest: No acute abnormality. Other: Visualized mastoid air cells appear clear. No discrete thyroid nodules. No pathologically enlarged cervical nodes. IMPRESSION: 1. No evidence of acute intracranial abnormality. No evidence of calvarial fracture. 2. Generalized cerebral volume loss and mild chronic small vessel ischemic changes in the cerebral white matter. Tiny chronic appearing right basal ganglia lacune. 3. No cervical spine fracture or subluxation. 4. Status post C3-4 ACDF with no evidence of hardware  complication. 5. Marked degenerative changes in the cervical spine as detailed. Electronically Signed   By: Ilona Sorrel M.D.   On: 09/17/2019 16:20   Ct Hip Right Wo Contrast  Result Date: 09/17/2019 CLINICAL DATA:  Right hip pain after fall 1 week ago EXAM: CT OF THE RIGHT HIP WITHOUT CONTRAST TECHNIQUE: Multidetector CT imaging of the right hip was performed according to the standard protocol. Multiplanar CT image reconstructions were also generated. COMPARISON:  CT 08/24/2018 FINDINGS: Bones/Joint/Cartilage The right hip is intact without fracture or dislocation. Moderate osteoarthritis of the right hip joint with joint space narrowing, subchondral cystic changes, and marginal osteophyte formation. There is mineralization within the area of the anterosuperior labrum. Enthesopathic changes are present at the greater trochanter and ischial tuberosity. The right SI joint and pubic symphysis are intact with mild-to-moderate degenerative changes. There is chondrocalcinosis at the symphysis pubis. There is suggestion of a small hip joint effusion with a few small mineralized loose bodies. Ligaments Suboptimally assessed by CT. Muscles and Tendons Fatty infiltration of the gluteus minimus muscle likely related to chronic tendon tear. No evidence of an  acute muscular or tendinous injury. Soft tissues No soft tissue hematoma or fluid collection. IMPRESSION: 1. No acute fracture or dislocation of the right hip. 2. Moderate osteoarthritis of the right hip joint with small joint effusion and a few small mineralized loose bodies. Electronically Signed   By: Davina Poke M.D.   On: 09/17/2019 16:22   Mr Hip Right Wo Contrast  Result Date: 09/18/2019 CLINICAL DATA:  Severe right hip pain EXAM: MR OF THE RIGHT HIP WITHOUT CONTRAST TECHNIQUE: Multiplanar, multisequence MR imaging was performed. No intravenous contrast was administered. COMPARISON:  CT 09/17/2019 FINDINGS: Bones: Within the intertrochanteric aspect  of the proximal right femur is irregular T1 hypointense signal which predominates near the base of the lesser trochanter (series 4, images 23-25). There is mild associated marrow edema within this region. No displaced fracture component. Right hip joint is intact without dislocation. Mild diffuse marrow heterogeneity without discrete marrow replacing lesion. Findings are nonspecific but can be seen in the setting of chronic anemia, smoking, and/or obesity. Articular cartilage and labrum Articular cartilage: Moderate diffuse cartilage thinning and surface irregularity. Labrum:  Diffuse degeneration. Joint or bursal effusion Joint effusion:  Small joint effusion with synovitis. Bursae: Small amount of iliopsoas bursal fluid. There is small peritrochanteric bursal fluid and edema. Muscles and tendons Muscles and tendons: Tendinosis with low-grade partial tearing of the insertional fibers of the gluteus minimus tendon. Bilateral hamstring tendinosis. Intramuscular edema about the right hip particularly involving the vastus lateralis and vastus intermedius muscles as well as the abductor muscle group. Other findings Miscellaneous:   No soft tissue hematoma. Colonic diverticulosis. IMPRESSION: 1. Irregular area low T1 signal intensity and associated mild marrow edema within the intertrochanteric region of the proximal right femur suggestive of a developing insufficiency fracture. 2. Small joint effusion with synovitis. 3. Moderate osteoarthritis of the right hip joint. 4. Mild iliopsoas and peritrochanteric bursitis. 5. Mild diffuse marrow heterogeneity without discrete marrow replacing lesion. Findings are nonspecific but can be seen in the setting of chronic anemia, smoking, and/or obesity. Electronically Signed   By: Davina Poke M.D.   On: 09/18/2019 09:17   Dg C-arm 1-60 Min  Result Date: 09/19/2019 CLINICAL DATA:  Insufficiency fracture of the proximal right femur. EXAM: RIGHT FEMUR 2 VIEWS; DG C-ARM 1-60  MIN COMPARISON:  MRI dated 09/18/2019 FINDINGS: Multiple C-arm images demonstrate the patient has undergone insertion of intramedullary nail and lag screw in the proximal right femur. IMPRESSION: Open reduction and internal fixation of the proximal right femur. FLUOROSCOPY TIME:  49 seconds C-arm fluoroscopic images were obtained intraoperatively and submitted for post operative interpretation. Electronically Signed   By: Lorriane Shire M.D.   On: 09/19/2019 20:52   Dg Femur, Min 2 Views Right  Result Date: 09/19/2019 CLINICAL DATA:  Insufficiency fracture of the proximal right femur. EXAM: RIGHT FEMUR 2 VIEWS; DG C-ARM 1-60 MIN COMPARISON:  MRI dated 09/18/2019 FINDINGS: Multiple C-arm images demonstrate the patient has undergone insertion of intramedullary nail and lag screw in the proximal right femur. IMPRESSION: Open reduction and internal fixation of the proximal right femur. FLUOROSCOPY TIME:  49 seconds C-arm fluoroscopic images were obtained intraoperatively and submitted for post operative interpretation. Electronically Signed   By: Lorriane Shire M.D.   On: 09/19/2019 20:52      Discharge Exam: Vitals:   09/26/19 0327 09/26/19 0747  BP: (!) 143/68 (!) 122/54  Pulse: 76 90  Resp: 14 17  Temp: 98.2 F (36.8 C) 97.8 F (36.6 C)  SpO2:  92% 91%    GENERAL: No acute distress.  Appears well.  HEENT: MMM.  Vision and hearing grossly intact.  NECK: Supple.  No apparent JVD.  RESP:  No IWOB. Good air movement bilaterally. CVS:  RRR. Heart sounds normal.  ABD/GI/GU: Bowel sounds present. Soft. Non tender.  MSK/EXT:  Moves extremities. No apparent deformity or edema.  SKIN: Dressing over RLE dry, clean and intact. NEURO: Awake, alert and oriented appropriately.  No gross deficit.  Coarse intention tremor. PSYCH: Calm. Normal affect.   The results of significant diagnostics from this hospitalization (including imaging, microbiology, ancillary and laboratory) are listed below for  reference.     Microbiology: Recent Results (from the past 240 hour(s))  SARS CORONAVIRUS 2 (TAT 6-24 HRS) Nasopharyngeal Nasopharyngeal Swab     Status: None   Collection Time: 09/17/19  7:19 PM   Specimen: Nasopharyngeal Swab  Result Value Ref Range Status   SARS Coronavirus 2 NEGATIVE NEGATIVE Final    Comment: (NOTE) SARS-CoV-2 target nucleic acids are NOT DETECTED. The SARS-CoV-2 RNA is generally detectable in upper and lower respiratory specimens during the acute phase of infection. Negative results do not preclude SARS-CoV-2 infection, do not rule out co-infections with other pathogens, and should not be used as the sole basis for treatment or other patient management decisions. Negative results must be combined with clinical observations, patient history, and epidemiological information. The expected result is Negative. Fact Sheet for Patients: SugarRoll.be Fact Sheet for Healthcare Providers: https://www.woods-mathews.com/ This test is not yet approved or cleared by the [place] FDA and  has been authorized for detection and/or diagnosis of SARS-CoV-2 by FDA under an Emergency Use Authorization (EUA). This EUA will remain  in effect (meaning this test can be used) for the duration of the COVID-19 declaration under Section 56 4(b)(1) of the Act, 21 U.S.C. section 360bbb-3(b)(1), unless the authorization is terminated or revoked sooner. Performed at [facility], [place] [address] [place],  [address]   Surgical pcr screen     Status: None   Collection Time: 09/18/19  2:37 PM   Specimen: Nasal Mucosa; Nasal Swab  Result Value Ref Range Status   MRSA, PCR NEGATIVE NEGATIVE Final   Staphylococcus aureus NEGATIVE NEGATIVE Final    Comment: (NOTE) The Xpert SA Assay (FDA approved for NASAL specimens in patients 36 years of age and older), is one component of a comprehensive surveillance program. It is not intended  to diagnose infection nor to guide or monitor treatment. Performed at [facility], [place] [address], [place], [name] [address]   SARS CORONAVIRUS 2 (TAT 6-24 HRS) Nasopharyngeal Nasopharyngeal Swab     Status: None   Collection Time: 09/23/19  9:48 AM   Specimen: Nasopharyngeal Swab  Result Value Ref Range Status   SARS Coronavirus 2 NEGATIVE NEGATIVE Final    Comment: (NOTE) SARS-CoV-2 target nucleic acids are NOT DETECTED. The SARS-CoV-2 RNA is generally detectable in upper and lower respiratory specimens during the acute phase of infection. Negative results do not preclude SARS-CoV-2 infection, do not rule out co-infections with other pathogens, and should not be used as the sole basis for treatment or other patient management decisions. Negative results must be combined with clinical observations, patient history, and epidemiological information. The expected result is Negative. Fact Sheet for Patients: SugarRoll.be Fact Sheet for Healthcare Providers: https://www.woods-mathews.com/ This test is not yet approved or cleared by the [place] FDA and  has been authorized for detection and/or diagnosis of SARS-CoV-2  by FDA under an Emergency Use Authorization (EUA). This EUA will remain  in effect (meaning this test can be used) for the duration of the COVID-19 declaration under Section 56 4(b)(1) of the Act, 21 U.S.C. section 360bbb-3(b)(1), unless the authorization is terminated or revoked sooner. Performed at [facility], [place] [address], [place], [place] [address]      Labs: BNP (last 3 results) Recent Labs    12/14/18 2237 03/25/19 1008  BNP 786.4* 0000000*   Basic Metabolic Panel: Recent Labs  Lab 09/20/19 0258 09/21/19 0308 09/22/19 0429  NA 139 140 137  K 4.9 4.1 4.3  CL 99 100 99  CO2 32 32 32  GLUCOSE 156* 92 91  BUN 15 19 12   CREATININE 0.96 1.03* 0.71  CALCIUM 9.0 9.0 8.7*  MG  --  1.6* 2.0     Liver Function Tests: Recent Labs  Lab 09/21/19 0308 09/22/19 0429  AST 19 19  ALT 8 8  ALKPHOS 88 94  BILITOT 0.9 0.8  PROT 5.4* 5.3*  ALBUMIN 2.5* 2.6*   No results for input(s): LIPASE, AMYLASE in the last 168 hours. No results for input(s): AMMONIA in the last 168 hours. CBC: Recent Labs  Lab 09/20/19 0258 09/22/19 0429 09/23/19 0252 09/24/19 0339  WBC 6.9  --   --   --   HGB 11.3* 10.1* 11.2* 10.9*  HCT 36.0 31.2* 35.2* 34.0*  MCV 103.4*  --   --   --   PLT 209  --   --   --    Cardiac Enzymes: No results for input(s): CKTOTAL, CKMB, CKMBINDEX, TROPONINI in the last 168 hours. BNP: Invalid input(s): POCBNP CBG: No results for input(s): GLUCAP in the last 168 hours. D-Dimer No results for input(s): DDIMER in the last 72 hours. Hgb A1c No results for input(s): HGBA1C in the last 72 hours. Lipid Profile No results for input(s): CHOL, HDL, LDLCALC, TRIG, CHOLHDL, LDLDIRECT in the last 72 hours. Thyroid function studies No results for input(s): TSH, T4TOTAL, T3FREE, THYROIDAB in the last 72 hours.  Invalid input(s): FREET3 Anemia work up No results for input(s): VITAMINB12, FOLATE, FERRITIN, TIBC, IRON, RETICCTPCT in the last 72 hours. Urinalysis    Component Value Date/Time   COLORURINE YELLOW 03/25/2019 1007   APPEARANCEUR CLEAR 03/25/2019 1007   LABSPEC 1.012 03/25/2019 1007   PHURINE 6.0 03/25/2019 1007   GLUCOSEU NEGATIVE 03/25/2019 1007   GLUCOSEU NEGATIVE 07/06/2009 1057   HGBUR NEGATIVE 03/25/2019 [place] 03/25/2019 [place] 03/25/2019 1007   PROTEINUR NEGATIVE 03/25/2019 1007   UROBILINOGEN 0.2 07/06/2009 1057   NITRITE NEGATIVE 03/25/2019 1007   LEUKOCYTESUR NEGATIVE 03/25/2019 1007   Sepsis Labs Invalid input(s): PROCALCITONIN,  WBC,  LACTICIDVEN   Time coordinating discharge: 35 minutes  SIGNED:  Mercy Riding, MD  [facility] 09/26/2019, 12:07 PM  If 7PM-7AM, please contact  night-coverage [email or web address] Password [email or web address]

## 2019-09-26 NOTE — Plan of Care (Signed)

## 2019-09-26 NOTE — TOC Transition Note (Signed)
Transition of Care [facility]) - CM/SW Discharge Note   Patient Details  Name: Jessica [name] MRN: [id number] Date of Birth: 23-May-1926  [facility]) CM/SW Contact:  Midge Minium MSN, RN, NCM-BC, ACM-RN [phone] Phone Number: 09/26/2019, 11:55 AM   Clinical Narrative:    Patient is medically stable to transition to [facility] for ST rehab. PASSR number provided to [facility] ([employer]), with the facility able to accept the patient today. CM updated Dr. Cyndia Skeeters and the patients daughter Jessica [name]. PTAR will be arranged for 1400.  Please call report to: [phone]; Room 306   Final next level of care: Skilled Nursing Facility Barriers to Discharge: No Barriers Identified   Patient Goals and CMS Choice Patient states their goals for this hospitalization and ongoing recovery are:: "to return home" CMS Medicare.gov Compare Post Acute Care list provided to:: Patient Choice offered to / list presented to : Patient, Adult Children  Discharge Placement              Patient chooses bed at: [place] Patient to be transferred to facility by: [place] Name of family member notified: Jessica [name] Patient and family notified of of transfer: 09/26/19  Discharge Plan and Services In-house Referral: NA Discharge Planning Services: CM Consult Post Acute Care Choice: [place]          DME Arranged: N/A DME Agency: NA       HH Arranged: NA HH Agency: NA        Social Determinants of Health (SDOH) Interventions     Readmission Risk Interventions Readmission Risk Prevention Plan 04/01/2019  Post Dischage Appt Complete  Medication Screening Complete  Transportation Screening Complete  Some recent data might be hidden

## 2019-09-26 NOTE — Care Management Important Message (Signed)
Important Message  Patient Details  Name: Jessica [name] MRN: [id number] Date of Birth: 1926-04-07   Medicare Important Message Given:  Yes     Memory Argue 09/26/2019, 1:43 PM

## 2019-09-26 NOTE — Care Management (Signed)
PASSR number received: [id number] [place] MSN, RN, NCM-BC, ACM-RN [phone]

## 2019-09-26 NOTE — Consult Note (Signed)
   [facility] CM Inpatient Consult   09/26/2019  Jessica [name] [name] [birth year] [id number]   Patient screened for [facility] Care Management follow up. Patient with a history of [facility] Care Management in the Medicare Next GEN ACO.    Electronic medical record reveals patient transition to a skilled nursing facility, [name].     Patient is per PT notes and Operative note 09/19/2019  a 85 yo admitted after recent fall with Rt hip fx s/p IM nail. Patient is eligible for [facility] Care Management services.   Plan: Will alert [facility] PAC RN of transitioning to [name] per inpatient [facility] team notes.  For questions,  please contact:   Natividad Brood, RN BSN [facility] Liaison  [phone] business mobile phone Toll free office [phone]  Fax number: [phone] [email or web address]

## 2019-09-26 NOTE — Progress Notes (Signed)
Physical Therapy Treatment Patient Details Name: Jessica [name] MRN: [id number] DOB: Jun 25, 1926 Today's Date: 09/26/2019    History of Present Illness 85 yo admitted after recent fall with Rt hip fx s/p IM nail. PMhx: COPD, CAD, CHF, TAVR, anxiety    PT Comments    Pt pleasant and willing to walk but states she lost a day thinking it was Sunday. Pt with assist for pivot to [facility] and max assist for pericare with improved transfers and gait tolerance today. Pt continues with increased sputum production and frequent reliance on yonker for suction. SpO2 93% on RA with gait and HR 63. D/C plan remains appropriate and will continue to follow to maximize independence and safety.     Follow Up Recommendations  Home health PT;Supervision/Assistance - 24 hour;SNF([facility] is ideal if possible. SNF if family declines ability to assist)     Equipment Recommendations  3in1 (PT)    Recommendations for Other Services       Precautions / Restrictions Precautions Precautions: Fall Restrictions RLE Weight Bearing: Weight bearing as tolerated    Mobility  Bed Mobility Overal bed mobility: Needs Assistance Bed Mobility: Supine to Sit     Supine to sit: Min guard     General bed mobility comments: HOb 35 degrees with use of rail and increased time, no physical assist  Transfers Overall transfer level: Needs assistance   Transfers: Sit to/from Stand Sit to Stand: Min guard Stand pivot transfers: Min assist       General transfer comment: cues for hand placement, guarding to stand and min physical assist to pivot bed to bSC  Ambulation/Gait Ambulation/Gait assistance: Min assist Gait Distance (Feet): 65 Feet Assistive device: Rolling walker (2 wheeled) Gait Pattern/deviations: Step-through pattern;Decreased stride length;Trunk flexed   Gait velocity interpretation: 1.31 - 2.62 ft/sec, indicative of limited community ambulator General Gait Details: min assist for balance  with posterior lean, cues for position in RW and management of RW   Stairs             Wheelchair Mobility    Modified Rankin (Stroke Patients Only)       Balance Overall balance assessment: Needs assistance Sitting-balance support: Feet supported Sitting balance-Leahy Scale: Good     Standing balance support: Bilateral upper extremity supported;During functional activity Standing balance-Leahy Scale: Poor                              Cognition Arousal/Alertness: Awake/alert Behavior During Therapy: WFL for tasks assessed/performed Overall Cognitive Status: Impaired/Different from baseline Area of Impairment: Memory;Safety/judgement                     Memory: Decreased short-term memory   Safety/Judgement: Decreased awareness of safety;Decreased awareness of deficits     General Comments: pt with constant sputum production and suction with yonker      Exercises General Exercises - Lower Extremity Long Arc Quad: AROM;Both;Seated;15 reps Hip Flexion/Marching: AROM;Both;15 reps;Seated    General Comments        Pertinent Vitals/Pain Pain Assessment: No/denies pain    Home Living                      Prior Function            PT Goals (current goals can now be found in the care plan section) Progress towards PT goals: Progressing toward goals    Frequency  Min 3X/week      PT Plan Current plan remains appropriate;Frequency needs to be updated    Co-evaluation              AM-PAC PT "6 Clicks" Mobility   Outcome Measure  Help needed turning from your back to your side while in a flat bed without using bedrails?: A Little Help needed moving from lying on your back to sitting on the side of a flat bed without using bedrails?: A Little Help needed moving to and from a bed to a chair (including a wheelchair)?: A Little Help needed standing up from a chair using your arms (e.g., wheelchair or bedside  chair)?: A Little Help needed to walk in hospital room?: A Little Help needed climbing 3-5 steps with a railing? : A Lot 6 Click Score: 17    End of Session Equipment Utilized During Treatment: Gait belt Activity Tolerance: Patient tolerated treatment well Patient left: with call bell/phone within reach;in chair;with chair alarm set;Other (comment)(with geomat) Nurse Communication: Mobility status PT Visit Diagnosis: Other abnormalities of gait and mobility (R26.89);History of falling (Z91.81)     Time: [id number] PT Time Calculation (min) (ACUTE ONLY): 33 min  Charges:  $Gait Training: 8-22 mins $Therapeutic Activity: 8-22 mins                     Lavinia Mcneely P, PT Acute Rehabilitation Services Pager: [phone] Office: [place] 09/26/2019, 8:37 AM

## 2019-09-26 NOTE — Progress Notes (Signed)
Pt belongings gathered, pt dressed and iv removed. Pt transported via [facility], RN 09/26/2019 2:32 PM

## 2019-09-28 DIAGNOSIS — J449 Chronic obstructive pulmonary disease, unspecified: Secondary | ICD-10-CM | POA: Diagnosis not present

## 2019-09-28 DIAGNOSIS — I2510 Atherosclerotic heart disease of native coronary artery without angina pectoris: Secondary | ICD-10-CM | POA: Diagnosis not present

## 2019-09-28 DIAGNOSIS — Z952 Presence of prosthetic heart valve: Secondary | ICD-10-CM | POA: Diagnosis not present

## 2019-09-28 DIAGNOSIS — J441 Chronic obstructive pulmonary disease with (acute) exacerbation: Secondary | ICD-10-CM | POA: Diagnosis not present

## 2019-10-05 ENCOUNTER — Emergency Department (HOSPITAL_COMMUNITY): Payer: Medicare Other

## 2019-10-05 ENCOUNTER — Other Ambulatory Visit: Payer: Self-pay

## 2019-10-05 ENCOUNTER — Encounter (HOSPITAL_COMMUNITY): Payer: Self-pay | Admitting: Emergency Medicine

## 2019-10-05 ENCOUNTER — Emergency Department (HOSPITAL_COMMUNITY)
Admission: EM | Admit: 2019-10-05 | Discharge: 2019-10-05 | Disposition: A | Discharge status: Home / Self Care | Payer: Medicare Other | Attending: Emergency Medicine | Admitting: Emergency Medicine

## 2019-10-05 DIAGNOSIS — Z79899 Other long term (current) drug therapy: Secondary | ICD-10-CM | POA: Insufficient documentation

## 2019-10-05 DIAGNOSIS — R0902 Hypoxemia: Secondary | ICD-10-CM | POA: Insufficient documentation

## 2019-10-05 DIAGNOSIS — Z87891 Personal history of nicotine dependence: Secondary | ICD-10-CM | POA: Insufficient documentation

## 2019-10-05 DIAGNOSIS — Z7982 Long term (current) use of aspirin: Secondary | ICD-10-CM | POA: Diagnosis not present

## 2019-10-05 DIAGNOSIS — R531 Weakness: Secondary | ICD-10-CM | POA: Diagnosis present

## 2019-10-05 DIAGNOSIS — F0390 Unspecified dementia without behavioral disturbance: Secondary | ICD-10-CM | POA: Diagnosis not present

## 2019-10-05 DIAGNOSIS — J449 Chronic obstructive pulmonary disease, unspecified: Secondary | ICD-10-CM | POA: Insufficient documentation

## 2019-10-05 DIAGNOSIS — I5032 Chronic diastolic (congestive) heart failure: Secondary | ICD-10-CM | POA: Diagnosis not present

## 2019-10-05 DIAGNOSIS — R0602 Shortness of breath: Secondary | ICD-10-CM | POA: Diagnosis not present

## 2019-10-05 DIAGNOSIS — Z20828 Contact with and (suspected) exposure to other viral communicable diseases: Secondary | ICD-10-CM | POA: Insufficient documentation

## 2019-10-05 DIAGNOSIS — I2510 Atherosclerotic heart disease of native coronary artery without angina pectoris: Secondary | ICD-10-CM | POA: Insufficient documentation

## 2019-10-05 LAB — BRAIN NATRIURETIC PEPTIDE: B Natriuretic Peptide: 105.5 pg/mL — ABNORMAL HIGH (ref 0.0–100.0)

## 2019-10-05 LAB — I-STAT CHEM 8, ED
BUN: 13 mg/dL (ref 8–23)
Calcium, Ion: 1.26 mmol/L (ref 1.15–1.40)
Chloride: 102 mmol/L (ref 98–111)
Creatinine, Ser: 0.7 mg/dL (ref 0.44–1.00)
Glucose, Bld: 84 mg/dL (ref 70–99)
HCT: 37 % (ref 36.0–46.0)
Hemoglobin: 12.6 g/dL (ref 12.0–15.0)
Potassium: 3.4 mmol/L — ABNORMAL LOW (ref 3.5–5.1)
Sodium: 141 mmol/L (ref 135–145)
TCO2: 31 mmol/L (ref 22–32)

## 2019-10-05 LAB — URINALYSIS, ROUTINE W REFLEX MICROSCOPIC
Bacteria, UA: NONE SEEN
Bilirubin Urine: NEGATIVE
Glucose, UA: NEGATIVE mg/dL
Ketones, ur: NEGATIVE mg/dL
Leukocytes,Ua: NEGATIVE
Nitrite: NEGATIVE
Protein, ur: NEGATIVE mg/dL
Specific Gravity, Urine: 1.004 — ABNORMAL LOW (ref 1.005–1.030)
pH: 6 (ref 5.0–8.0)

## 2019-10-05 LAB — CBC WITH DIFFERENTIAL/PLATELET
Abs Immature Granulocytes: 0.01 10*3/uL (ref 0.00–0.07)
Basophils Absolute: 0 10*3/uL (ref 0.0–0.1)
Basophils Relative: 0 %
Eosinophils Absolute: 0.1 10*3/uL (ref 0.0–0.5)
Eosinophils Relative: 3 %
HCT: 37.3 % (ref 36.0–46.0)
Hemoglobin: 11.7 g/dL — ABNORMAL LOW (ref 12.0–15.0)
Immature Granulocytes: 0 %
Lymphocytes Relative: 21 %
Lymphs Abs: 1 10*3/uL (ref 0.7–4.0)
MCH: 32.1 pg (ref 26.0–34.0)
MCHC: 31.4 g/dL (ref 30.0–36.0)
MCV: 102.5 fL — ABNORMAL HIGH (ref 80.0–100.0)
Monocytes Absolute: 0.3 10*3/uL (ref 0.1–1.0)
Monocytes Relative: 7 %
Neutro Abs: 3.2 10*3/uL (ref 1.7–7.7)
Neutrophils Relative %: 69 %
Platelets: 359 10*3/uL (ref 150–400)
RBC: 3.64 MIL/uL — ABNORMAL LOW (ref 3.87–5.11)
RDW: 14.3 % (ref 11.5–15.5)
WBC: 4.7 10*3/uL (ref 4.0–10.5)
nRBC: 0 % (ref 0.0–0.2)

## 2019-10-05 LAB — TROPONIN I (HIGH SENSITIVITY)
Troponin I (High Sensitivity): 11 ng/L (ref <18)
Troponin I (High Sensitivity): 4 ng/L (ref <18)

## 2019-10-05 LAB — POC SARS CORONAVIRUS 2 AG -  ED: SARS Coronavirus 2 Ag: NEGATIVE

## 2019-10-05 LAB — SARS CORONAVIRUS 2 (TAT 6-24 HRS): SARS Coronavirus 2: NEGATIVE

## 2019-10-05 NOTE — ED Provider Notes (Signed)
[facility] EMERGENCY DEPARTMENT Provider Note   CSN: [id number] Arrival date & time: 10/05/19  [id number]     History   Chief Complaint Chief Complaint  Patient presents with  . Weakness    HPI Jessica [name] is a 85 y.o. female with a past medical history significant for anxiety, fibromyalgia, pancreatic lesion, COPD, history of PE, chronic diastolic heart failure, CAD, and hyperlipidemia who presents to the ED via EMS due to hypoxia and lethargy. Patient unable to give HPI due to dementia and AMS. History obtained from [facility] and Alfonzo Beers at [facility] home. Per RN, patient was found to have O2 saturation in 80% this morning with extreme lethargy. MD was notified and placed an order for 4mg  of morphine and lasix which improved O2 saturation to 85%. Patient was then placed on 2L [place] which increased oxygen to 89%. Patient's daughter was called at the nursing home and preferred patient to be sent to the ED for further evaluation. Med tech, Stanton Kidney, confirmed that patient has been taking Lovenox daily and is scheduled to continue it until 10/26/19. Per EMS, patient was found to have O2 saturation at 90% and was alerted and oriented at baseline. Patient has a history of memory impaired. At bedside, patient is coughing periodically, but denies shortness of breath and chest pain.   Level 5 Caveat due to memory impairment  Spoke to daughter, Erin Hearing who states that the patient just moved to the nursing home for rehab after her hip surgeries for the past week. Daughter notes that the patient has been chronically on 2L [place] for sometime now, but the nursing home has not started patient back on it. Daughter notes that the patient has been acting "different" for the past 3 days. She states that patient has not been calling as much. Confirmed full code status with Daughter.  Chart Reviewed. Patient sees Dr. Ashby Dawes with pulmonary that manages her at home oxygen.   Past  Medical History:  Diagnosis Date  . Anxiety   . congenital nystagmus   . COPD (chronic obstructive pulmonary disease) ([place])   . Coronary artery disease   . DJD (degenerative joint disease)   . Fibromyalgia   . Low back pain syndrome   . Memory loss   . Other and unspecified hyperlipidemia   . Pancreatic lesion   . Pneumonia   . S/P TAVR (transcatheter aortic valve replacement)    26 mm Edwards Sapien 3 via the TF approach  . Severe aortic stenosis   . Venous insufficiency     Patient Active Problem List   Diagnosis Date Noted  . Pressure injury of skin 09/19/2019  . Acute right hip pain 09/17/2019  . S/P TAVR (transcatheter aortic valve replacement)   . Chronic respiratory failure ([place]) 12/15/2018  . Depression 12/15/2018  . CAD (coronary artery disease) 12/15/2018  . Chronic diastolic CHF (congestive heart failure) ([place]) 12/15/2018  . History of pulmonary embolism 08/25/2018  . Severe aortic stenosis   . Aortic atherosclerosis ([place]) 05/20/2018  . Insomnia 04/08/2016  . COPD (chronic obstructive pulmonary disease) ([place]) 05/07/2015  . Labial cyst 05/07/2015  . Exertional dyspnea 02/26/2015  . Vitamin D deficiency 01/14/2009  . Hyperlipidemia 01/14/2009  . DEGENERATIVE JOINT DISEASE 06/12/2008  . Fibromyalgia 12/15/2007  . Generalized anxiety disorder 12/14/2007    Past Surgical History:  Procedure Laterality Date  . ABDOMINAL HYSTERECTOMY    . ANTERIOR CERVICAL DISCECTOMY    . APPENDECTOMY    .  CARPAL TUNNEL RELEASE  12/2011   right arm  . CATARACT EXTRACTION    . CORONARY ATHERECTOMY  07/23/2018   PTCA/orbital atherectomy/DES x 1 proximal to mid LAD  . CORONARY ATHERECTOMY N/A 07/23/2018   Procedure: CORONARY ATHERECTOMY;  Surgeon: Burnell Blanks, MD;  Location: [facility];  Service: Cardiovascular;  Laterality: N/A;  . CORONARY STENT INTERVENTION    . CORONARY STENT INTERVENTION N/A 07/23/2018   Procedure: CORONARY STENT INTERVENTION;  Surgeon:  Burnell Blanks, MD;  Location: [facility];  Service: Cardiovascular;  Laterality: N/A;  . EYE SURGERY    . INTRAMEDULLARY (IM) NAIL INTERTROCHANTERIC Right 09/19/2019   Procedure: INTRAMEDULLARY (IM) NAIL INTERTROCHANTRIC;  Surgeon: Renette Butters, MD;  Location: [facility];  Service: Orthopedics;  Laterality: Right;  . LUMBAR LAMINECTOMY    . right knee arthroscopy    . right shoulder replacement  04/2009  . right shoulder surgery  2008   Dr. Noemi Chapel  . RIGHT/LEFT HEART CATH AND CORONARY ANGIOGRAPHY N/A 07/07/2018   Procedure: RIGHT/LEFT HEART CATH AND CORONARY ANGIOGRAPHY;  Surgeon: Burnell Blanks, MD;  Location: [facility];  Service: Cardiovascular;  Laterality: N/A;  . TEE WITHOUT CARDIOVERSION N/A 03/29/2019   Procedure: TRANSESOPHAGEAL ECHOCARDIOGRAM (TEE);  Surgeon: Burnell Blanks, MD;  Location: [facility];  Service: Open Heart Surgery;  Laterality: N/A;  . TRANSCATHETER AORTIC VALVE REPLACEMENT, TRANSFEMORAL N/A 03/29/2019   Procedure: TRANSCATHETER AORTIC VALVE REPLACEMENT, TRANSFEMORAL;  Surgeon: Burnell Blanks, MD;  Location: [facility];  Service: Open Heart Surgery;  Laterality: N/A;  . ULTRASOUND GUIDANCE FOR VASCULAR ACCESS  07/07/2018   Procedure: Ultrasound Guidance For Vascular Access;  Surgeon: Burnell Blanks, MD;  Location: [facility];  Service: Cardiovascular;;     OB History   No obstetric history on file.      Home Medications    Prior to Admission medications   Medication Sig Start Date End Date Taking? Authorizing Provider  acetaminophen (TYLENOL) 500 MG tablet Take 2 tablets (1,000 mg total) by mouth every 8 (eight) hours as needed for mild pain, moderate pain or headache (pain). 09/21/19   Mercy Riding, MD  albuterol (PROVENTIL) (2.5 MG/3ML) 0.083% nebulizer solution Take 3 mLs (2.5 mg total) by nebulization every 4 (four) hours as needed for wheezing or shortness of breath. DX: COPD J44.9 04/12/18   Flora Lipps, MD  albuterol (VENTOLIN HFA) 108 (90 Base) MCG/ACT inhaler Inhale 2 puffs into the lungs every 6 (six) hours as needed for wheezing. 08/29/19   Flora Lipps, MD  AMBULATORY NON FORMULARY MEDICATION Medication Name: Incentive spirometer Use 10-15 times per day 12/14/18   Flora Lipps, MD  arformoterol (BROVANA) 15 MCG/2ML NEBU Take 2 mLs (15 mcg total) by nebulization 2 (two) times daily. DX: COPD J44.9 07/27/17   Flora Lipps, MD  aspirin EC 81 MG tablet Take 1 tablet (81 mg total) by mouth daily. Patient taking differently: Take 81 mg by mouth at bedtime.  04/01/19   Eileen Stanford, PA-C  budesonide (PULMICORT) 0.5 MG/2ML nebulizer solution Take 2 mLs (0.5 mg total) by nebulization 2 (two) times daily. DX: COPD J44.9 07/09/17   Flora Lipps, MD  calcium-vitamin D (OSCAL WITH D) 500-200 MG-UNIT tablet Take 2 tablets by mouth daily with breakfast. 09/22/19   Mercy Riding, MD  clonazePAM (KLONOPIN) 0.5 MG tablet Take 1 tablet (0.5 mg total) by mouth 2 (two) times daily. 09/26/19 10/26/19  Mercy Riding, MD  enoxaparin (LOVENOX) 40 MG/0.4ML injection Inject 0.4 mLs (40 mg total) into the skin daily for 30 doses. For 30 days post op for DVT prophylaxis 09/19/19 10/19/19  Prudencio Burly III, PA-C  escitalopram (LEXAPRO) 20 MG tablet Take 1 tablet (20 mg total) by mouth daily. Patient taking differently: Take 20 mg by mouth every morning.  06/30/18   [name], [name], MD  ferrous sulfate 325 (65 FE) MG tablet Take 1 tablet (325 mg total) by mouth daily. Patient not taking: Reported on 09/17/2019 04/01/19 03/31/20  Eileen Stanford, PA-C  fluticasone [facility]) 50 MCG/ACT nasal spray Place 2 sprays into both nostrils daily. Patient taking differently: Place 2 sprays into both nostrils daily as needed for allergies or rhinitis.  12/18/18   Ghimire, Henreitta Leber, MD  furosemide (LASIX) 20 MG tablet Take 1 tablet (20 mg total) by mouth daily as needed for fluid or edema (sob or weight gain). 09/26/19  12/25/19  Mercy Riding, MD  HYDROcodone-acetaminophen (NORCO) 5-325 MG tablet Take 0.5-1 tablets by mouth every 6 (six) hours as needed for severe pain. 09/19/19   Prudencio Burly III, PA-C  loratadine (CLARITIN) 10 MG tablet Take 1 tablet (10 mg total) by mouth daily. Patient not taking: Reported on 09/17/2019 12/18/18   Jonetta Osgood, MD  polyethylene glycol (MIRALAX / GLYCOLAX) 17 g packet Take 17 g by mouth daily as needed for mild constipation. 09/21/19   Mercy Riding, MD  Respiratory Therapy Supplies (FLUTTER) DEVI 1 each by Does not apply route daily. 12/14/18   Flora Lipps, MD  senna-docusate (SENOKOT-S) 8.6-50 MG tablet Take 2 tablets by mouth 2 (two) times daily as needed for mild constipation or moderate constipation. 09/21/19   Mercy Riding, MD  simvastatin (ZOCOR) 20 MG tablet Take 1 tablet (20 mg total) by mouth at bedtime. 12/23/17   [name], MD  triamcinolone cream (KENALOG) 0.1 % Apply 1 application topically 2 (two) times daily as needed (rash). 08/10/19   [name], [name], MD  Vitamin D, Ergocalciferol, (DRISDOL) 1.25 MG (50000 UT) CAPS capsule Take 1 capsule (50,000 Units total) by mouth every 7 (seven) days. 09/28/19   Mercy Riding, MD    Family History Family History  Problem Relation Age of Onset  . Other Mother        broken hip  . Other Father        kidney problems  . Colon cancer Neg Hx   . Stomach cancer Neg Hx   . Rectal cancer Neg Hx   . Pancreatic cancer Neg Hx     Social History Social History   Tobacco Use  . Smoking status: Former Smoker    Packs/day: 2.00    Years: 30.00    Pack years: 60.00    Types: Cigarettes    Quit date: 10/28/1991    Years since quitting: 27.9  . Smokeless tobacco: Never Used  Substance Use Topics  . Alcohol use: Yes    Comment: 1 drink per night  . Drug use: No     Allergies   Contrast media [iodinated diagnostic agents] and Iohexol   Review of Systems Review of Systems  Unable to perform  ROS: Dementia     Physical Exam Updated Vital Signs BP 140/70   Pulse 73   Temp 97.6 F (36.4 C) (Oral)   Resp 14   Ht 5\' 4"  (1.626 m)   Wt 54.4 kg   SpO2 93%   BMI 20.60  kg/m   Physical Exam Vitals signs and nursing note reviewed.  Constitutional:      General: She is not in acute distress.    Appearance: She is not toxic-appearing.  HENT:     Head: Normocephalic.  Eyes:     Conjunctiva/sclera: Conjunctivae normal.  Neck:     Musculoskeletal: Neck supple.  Cardiovascular:     Rate and Rhythm: Normal rate and regular rhythm.     Pulses: Normal pulses.     Heart sounds: Normal heart sounds. No murmur. No friction rub. No gallop.   Pulmonary:     Effort: Pulmonary effort is normal.     Breath sounds: Wheezing and rhonchi present.  Abdominal:     General: Abdomen is flat. Bowel sounds are normal. There is no distension.     Palpations: Abdomen is soft.     Tenderness: There is no abdominal tenderness. There is no guarding or rebound.  Musculoskeletal:     Right lower leg: No edema.     Left lower leg: No edema.     Comments: Able to move all 4 extremities without difficulty. No lower extremity edema bilaterally. Negative Homans sign bilaterally. Distal sensation and pulses intact.  Skin:    General: Skin is warm and dry.     Comments: Well healing incision site on right hip region with no erythema or discharge  Neurological:     General: No focal deficit present.     Mental Status: She is alert. Mental status is at baseline.      ED Treatments / Results  Labs (all labs ordered are listed, but only abnormal results are displayed) Labs Reviewed  CBC WITH DIFFERENTIAL/PLATELET - Abnormal; Notable for the following components:      Result Value   RBC 3.64 (*)    Hemoglobin 11.7 (*)    MCV 102.5 (*)    All other components within normal limits  BRAIN NATRIURETIC PEPTIDE - Abnormal; Notable for the following components:   B Natriuretic Peptide 105.5 (*)    All  other components within normal limits  URINALYSIS, ROUTINE W REFLEX MICROSCOPIC - Abnormal; Notable for the following components:   Color, Urine STRAW (*)    APPearance HAZY (*)    Specific Gravity, Urine 1.004 (*)    Hgb urine dipstick SMALL (*)    All other components within normal limits  I-STAT CHEM 8, ED - Abnormal; Notable for the following components:   Potassium 3.4 (*)    All other components within normal limits  SARS CORONAVIRUS 2 (TAT 6-24 HRS)  URINE CULTURE  POC SARS CORONAVIRUS 2 AG -  ED  TROPONIN I (HIGH SENSITIVITY)  TROPONIN I (HIGH SENSITIVITY)    EKG EKG Interpretation  Date/Time:  Wednesday October 05 2019 06:34:22 EST Ventricular Rate:  70 PR Interval:    QRS Duration: 106 QT Interval:  429 QTC Calculation: 463 R Axis:   -40 Text Interpretation: Sinus rhythm Left ventricular hypertrophy Anterior infarct, old Confirmed by Dory Horn) on 10/05/2019 6:52:45 AM   Radiology Dg Chest Portable 1 View  Result Date: 10/05/2019 CLINICAL DATA:  Hypoxia. EXAM: PORTABLE CHEST 1 VIEW COMPARISON:  03/29/2019 FINDINGS: Patient slightly rotated to the left. Lungs are adequately inflated without focal airspace consolidation or effusion. Cardiomediastinal silhouette and remainder of the exam is unchanged. IMPRESSION: No acute cardiopulmonary disease. Electronically Signed   By: Marin Olp M.D.   On: 10/05/2019 07:23    Procedures Procedures (including critical care time)  Medications  Ordered in ED Medications - No data to display   Initial Impression / Assessment and Plan / ED Course  I have reviewed the triage vital signs and the nursing notes.  Pertinent labs & imaging results that were available during my care of the patient were reviewed by me and considered in my medical decision making (see chart for details).       85 year old female presents to the ED via EMS from [facility] where patient was found to be hypoxic with an O2 saturation at  80% early this morning.  MD at the nursing home was notified and patient was given 4 mg of IV morphine and Lasix which increased her O2 sats to 85%.  Patient was then placed on 2 L nasal cannula at nursing facility, but still brought in per patient's daughter's request for further evaluation.  Spoke to patient's daughter who states that patient is chronically on 2L of nasal cannula, but just recently moved to [facility] for rehab after a hip operation and has not been restarted on it.  Upon arrival, patient is afebrile, not tachycardic, O2 saturation at 93% on room air.  Patient in no acute distress and non-toxic-appearing.  Patient just recently underwent intramedullar nailing of the right hip on 09/18/2021 by Dr. Percell Miller.  Patient has been on Lovenox since the operation confirmed by med tech Stanton Kidney at the nursing home.  Suspect hypoxia is related to potential Covid infection vs. chronic COPD vs. congestive heart failure exacerbation vs. potential PE given her recent operation, but less likely due to Lovenox and not hypoxic or tachycardic here in the ED. Patient also does not have any lower extremity edema or tenderness. Will obtain routine labs, troponin, UA with urine culture, chest x-ray, EKG.  UA negative for signs of infection with mild amount of hematuria.  CBC reassuring with hemoglobin at 11.7 which appears to be around patient's baseline.  Initial troponin negative, will get delta troponin.  Rapid antigen Covid test negative, will send out for PCR test.  Chest x-ray personally reviewed which is negative for signs of infection and cardiomegaly.  EKG reviewed which illustrates sinus rhythm with no new signs of ischemia when compared to previous EKGs.  Will place patient back on 2 L nasal cannula and reassess oxygen saturation. BNP elevated at 105.5. Given that CXR is normal and patient does not appear fluid overloaded at this time, doubt congestive heart failure exacerbation at this time.   Delta  troponin negative. With normal delta troponin and no changed EKG doubt ACS is the cause of patient's reported hypoxia prior to arrival.   9:50 AM re-evaluated patient. Patient is currently on 2L [place] with O2 saturation between 98-100%. Spoke to Tammy at [facility] who notes that they have oxygen for patient, but they typically only use it if patient drops below 90%. I have requested to keep patient on 2L [place] all the time given Dr. Mathis Fare recommendation from previous office visit. Tammy states understanding and wrote note in patient's chart. Patient continues to be at 100% O2 saturation on 2L [place]. Will discharge patient to [facility] home on supplement 2L [place].   Discussed results and discharge instructions with daughter on the phone. She has been advised to call the nursing home again and make sure patient is chronically on 2L [place]. Strict ED precautions discussed with daughter. Daughter states understanding and agrees to plan. Patient discharged to [facility] in no acute distress and stable vitals   Discussed case  with Dr. Kathrynn Humble who evaluated patient and agrees with assessment and plan.    Jessica [name] was evaluated in Emergency Department on 10/05/2019 for the symptoms described in the history of present illness. She was evaluated in the context of the global COVID-19 pandemic, which necessitated consideration that the patient might be at risk for infection with the SARS-CoV-2 virus that causes COVID-19. Institutional protocols and algorithms that pertain to the evaluation of patients at risk for COVID-19 are in a state of rapid change based on information released by regulatory bodies including the CDC and federal and state organizations. These policies and algorithms were followed during the patient's care in the ED.  Final Clinical Impressions(s) / ED Diagnoses   Final diagnoses:  Hypoxia    ED Discharge Orders    None       Jonette Eva, PA-C 10/05/19 Merrill, Ankit, MD 10/07/19 1937

## 2019-10-05 NOTE — ED Notes (Signed)
Pt given turkey sandwich bag 

## 2019-10-05 NOTE — Discharge Instructions (Addendum)
Your labs and chest x-ray were normal today. I discussed with your daughter about your low oxygen and she notes that you are always on 2L nasal cannula prior to moving to rehab. Continue your at home oxygen. If you begin to develop severe central chest pain or worsening shortness of breath return to the ER. Your oxygen remained normal while here in the ED. Your COVID test is still pending. Continue to quarantine until you get your results. Return to the ER for new or worsening symptoms.

## 2019-10-05 NOTE — ED Triage Notes (Signed)
Pt arrived via [facility] from [facility]. Sent out for evaluation for hypoxia and lethargy after administration of morphine and lasix at nursing home around 0425. Per EMS patient was initially 90% RA. Pt alert and oriented (to baseline). Only complaint is mild malaise. She denies SOB at this time. VS: BP 134/62, SPO2 94% RA, HR 72, CBG 117.

## 2019-10-06 LAB — URINE CULTURE: Culture: <10000 — AB

## 2019-10-10 DIAGNOSIS — M84351A Stress fracture, right femur, initial encounter for fracture: Secondary | ICD-10-CM | POA: Diagnosis not present

## 2019-10-24 DIAGNOSIS — M84351D Stress fracture, right femur, subsequent encounter for fracture with routine healing: Secondary | ICD-10-CM | POA: Diagnosis not present

## 2019-10-24 DIAGNOSIS — Z9981 Dependence on supplemental oxygen: Secondary | ICD-10-CM | POA: Diagnosis not present

## 2019-10-24 DIAGNOSIS — M170 Bilateral primary osteoarthritis of knee: Secondary | ICD-10-CM | POA: Diagnosis not present

## 2019-10-24 DIAGNOSIS — I2510 Atherosclerotic heart disease of native coronary artery without angina pectoris: Secondary | ICD-10-CM | POA: Diagnosis not present

## 2019-10-24 DIAGNOSIS — Z7901 Long term (current) use of anticoagulants: Secondary | ICD-10-CM | POA: Diagnosis not present

## 2019-10-24 DIAGNOSIS — J9611 Chronic respiratory failure with hypoxia: Secondary | ICD-10-CM | POA: Diagnosis not present

## 2019-10-24 DIAGNOSIS — J449 Chronic obstructive pulmonary disease, unspecified: Secondary | ICD-10-CM | POA: Diagnosis not present

## 2019-10-24 DIAGNOSIS — F411 Generalized anxiety disorder: Secondary | ICD-10-CM | POA: Diagnosis not present

## 2019-10-24 DIAGNOSIS — M797 Fibromyalgia: Secondary | ICD-10-CM | POA: Diagnosis not present

## 2019-10-24 DIAGNOSIS — Z7951 Long term (current) use of inhaled steroids: Secondary | ICD-10-CM | POA: Diagnosis not present

## 2019-10-24 DIAGNOSIS — Z7982 Long term (current) use of aspirin: Secondary | ICD-10-CM | POA: Diagnosis not present

## 2019-10-24 DIAGNOSIS — I5032 Chronic diastolic (congestive) heart failure: Secondary | ICD-10-CM | POA: Diagnosis not present

## 2019-10-24 DIAGNOSIS — G4700 Insomnia, unspecified: Secondary | ICD-10-CM | POA: Diagnosis not present

## 2019-10-24 DIAGNOSIS — Z952 Presence of prosthetic heart valve: Secondary | ICD-10-CM | POA: Diagnosis not present

## 2019-10-24 DIAGNOSIS — E559 Vitamin D deficiency, unspecified: Secondary | ICD-10-CM | POA: Diagnosis not present

## 2019-10-24 DIAGNOSIS — D62 Acute posthemorrhagic anemia: Secondary | ICD-10-CM | POA: Diagnosis not present

## 2019-10-25 ENCOUNTER — Telehealth: Payer: Self-pay | Admitting: Family Medicine

## 2019-10-25 NOTE — Telephone Encounter (Signed)
Home Health Verbal Orders - Caller/Agency: [facility] Number: [phone] Requesting OT/PT/Skilled Nursing/Social Work/Speech Therapy: PT re: gait training, strengthening, balance, and coordination training. Frequency: 2x4wk, 1x2wk

## 2019-10-25 NOTE — Telephone Encounter (Signed)
Okay for verbal 

## 2019-10-26 DIAGNOSIS — M170 Bilateral primary osteoarthritis of knee: Secondary | ICD-10-CM | POA: Diagnosis not present

## 2019-10-26 DIAGNOSIS — F411 Generalized anxiety disorder: Secondary | ICD-10-CM | POA: Diagnosis not present

## 2019-10-26 DIAGNOSIS — I2510 Atherosclerotic heart disease of native coronary artery without angina pectoris: Secondary | ICD-10-CM | POA: Diagnosis not present

## 2019-10-26 DIAGNOSIS — I5032 Chronic diastolic (congestive) heart failure: Secondary | ICD-10-CM | POA: Diagnosis not present

## 2019-10-26 DIAGNOSIS — J449 Chronic obstructive pulmonary disease, unspecified: Secondary | ICD-10-CM | POA: Diagnosis not present

## 2019-10-26 DIAGNOSIS — M84351D Stress fracture, right femur, subsequent encounter for fracture with routine healing: Secondary | ICD-10-CM | POA: Diagnosis not present

## 2019-10-26 NOTE — Telephone Encounter (Signed)
Verbal given to [facility].

## 2019-10-26 NOTE — Telephone Encounter (Signed)
It is okay to give verbal authorization to requested services. Thanks, BJ 

## 2019-10-27 ENCOUNTER — Telehealth: Payer: Self-pay | Admitting: *Deleted

## 2019-10-27 DIAGNOSIS — M84351D Stress fracture, right femur, subsequent encounter for fracture with routine healing: Secondary | ICD-10-CM | POA: Diagnosis not present

## 2019-10-27 DIAGNOSIS — F411 Generalized anxiety disorder: Secondary | ICD-10-CM | POA: Diagnosis not present

## 2019-10-27 DIAGNOSIS — J449 Chronic obstructive pulmonary disease, unspecified: Secondary | ICD-10-CM | POA: Diagnosis not present

## 2019-10-27 DIAGNOSIS — I5032 Chronic diastolic (congestive) heart failure: Secondary | ICD-10-CM | POA: Diagnosis not present

## 2019-10-27 DIAGNOSIS — M170 Bilateral primary osteoarthritis of knee: Secondary | ICD-10-CM | POA: Diagnosis not present

## 2019-10-27 DIAGNOSIS — I2510 Atherosclerotic heart disease of native coronary artery without angina pectoris: Secondary | ICD-10-CM | POA: Diagnosis not present

## 2019-10-27 NOTE — Telephone Encounter (Signed)
Copied from [place] [id number]. Topic: General - Other >> Oct 27, 2019  1:37 PM Rainey Pines A wrote: [facility]  nurse called to get an order for a Transport wheelchair to be faxed to [facility] in [place]. Best contact [phone]

## 2019-10-31 ENCOUNTER — Other Ambulatory Visit: Payer: Self-pay | Admitting: Internal Medicine

## 2019-10-31 DIAGNOSIS — J449 Chronic obstructive pulmonary disease, unspecified: Secondary | ICD-10-CM

## 2019-10-31 DIAGNOSIS — J42 Unspecified chronic bronchitis: Secondary | ICD-10-CM

## 2019-10-31 NOTE — Telephone Encounter (Signed)
Rx given to PCP to sign, will fax after signature obtained.

## 2019-10-31 NOTE — Telephone Encounter (Signed)
It is ok to fax order for transport wheelchair as requested. Thanks, Jessica [name]

## 2019-10-31 NOTE — Telephone Encounter (Signed)
Order faxed to [facility] as requested.

## 2019-10-31 NOTE — Telephone Encounter (Signed)
Okay for order?

## 2019-11-01 ENCOUNTER — Other Ambulatory Visit: Payer: Self-pay

## 2019-11-01 DIAGNOSIS — I2510 Atherosclerotic heart disease of native coronary artery without angina pectoris: Secondary | ICD-10-CM | POA: Diagnosis not present

## 2019-11-01 DIAGNOSIS — M84351D Stress fracture, right femur, subsequent encounter for fracture with routine healing: Secondary | ICD-10-CM | POA: Diagnosis not present

## 2019-11-01 DIAGNOSIS — J449 Chronic obstructive pulmonary disease, unspecified: Secondary | ICD-10-CM

## 2019-11-01 DIAGNOSIS — I5032 Chronic diastolic (congestive) heart failure: Secondary | ICD-10-CM | POA: Diagnosis not present

## 2019-11-01 DIAGNOSIS — M170 Bilateral primary osteoarthritis of knee: Secondary | ICD-10-CM | POA: Diagnosis not present

## 2019-11-01 DIAGNOSIS — F411 Generalized anxiety disorder: Secondary | ICD-10-CM | POA: Diagnosis not present

## 2019-11-03 DIAGNOSIS — F411 Generalized anxiety disorder: Secondary | ICD-10-CM | POA: Diagnosis not present

## 2019-11-03 DIAGNOSIS — I5032 Chronic diastolic (congestive) heart failure: Secondary | ICD-10-CM | POA: Diagnosis not present

## 2019-11-03 DIAGNOSIS — M170 Bilateral primary osteoarthritis of knee: Secondary | ICD-10-CM | POA: Diagnosis not present

## 2019-11-03 DIAGNOSIS — M84351D Stress fracture, right femur, subsequent encounter for fracture with routine healing: Secondary | ICD-10-CM | POA: Diagnosis not present

## 2019-11-03 DIAGNOSIS — I2510 Atherosclerotic heart disease of native coronary artery without angina pectoris: Secondary | ICD-10-CM | POA: Diagnosis not present

## 2019-11-03 DIAGNOSIS — J449 Chronic obstructive pulmonary disease, unspecified: Secondary | ICD-10-CM | POA: Diagnosis not present

## 2019-11-07 DIAGNOSIS — M25551 Pain in right hip: Secondary | ICD-10-CM | POA: Diagnosis not present

## 2019-11-07 DIAGNOSIS — M545 Low back pain: Secondary | ICD-10-CM | POA: Diagnosis not present

## 2019-11-08 DIAGNOSIS — M84351D Stress fracture, right femur, subsequent encounter for fracture with routine healing: Secondary | ICD-10-CM | POA: Diagnosis not present

## 2019-11-08 DIAGNOSIS — F411 Generalized anxiety disorder: Secondary | ICD-10-CM | POA: Diagnosis not present

## 2019-11-08 DIAGNOSIS — I2510 Atherosclerotic heart disease of native coronary artery without angina pectoris: Secondary | ICD-10-CM | POA: Diagnosis not present

## 2019-11-08 DIAGNOSIS — M170 Bilateral primary osteoarthritis of knee: Secondary | ICD-10-CM | POA: Diagnosis not present

## 2019-11-08 DIAGNOSIS — J449 Chronic obstructive pulmonary disease, unspecified: Secondary | ICD-10-CM | POA: Diagnosis not present

## 2019-11-08 DIAGNOSIS — I5032 Chronic diastolic (congestive) heart failure: Secondary | ICD-10-CM | POA: Diagnosis not present

## 2019-11-10 DIAGNOSIS — F411 Generalized anxiety disorder: Secondary | ICD-10-CM | POA: Diagnosis not present

## 2019-11-10 DIAGNOSIS — I2510 Atherosclerotic heart disease of native coronary artery without angina pectoris: Secondary | ICD-10-CM | POA: Diagnosis not present

## 2019-11-10 DIAGNOSIS — I5032 Chronic diastolic (congestive) heart failure: Secondary | ICD-10-CM | POA: Diagnosis not present

## 2019-11-10 DIAGNOSIS — M170 Bilateral primary osteoarthritis of knee: Secondary | ICD-10-CM | POA: Diagnosis not present

## 2019-11-10 DIAGNOSIS — M84351D Stress fracture, right femur, subsequent encounter for fracture with routine healing: Secondary | ICD-10-CM | POA: Diagnosis not present

## 2019-11-10 DIAGNOSIS — J449 Chronic obstructive pulmonary disease, unspecified: Secondary | ICD-10-CM | POA: Diagnosis not present

## 2019-11-16 DIAGNOSIS — I5032 Chronic diastolic (congestive) heart failure: Secondary | ICD-10-CM | POA: Diagnosis not present

## 2019-11-16 DIAGNOSIS — J449 Chronic obstructive pulmonary disease, unspecified: Secondary | ICD-10-CM | POA: Diagnosis not present

## 2019-11-16 DIAGNOSIS — I2510 Atherosclerotic heart disease of native coronary artery without angina pectoris: Secondary | ICD-10-CM | POA: Diagnosis not present

## 2019-11-16 DIAGNOSIS — M84351D Stress fracture, right femur, subsequent encounter for fracture with routine healing: Secondary | ICD-10-CM | POA: Diagnosis not present

## 2019-11-16 DIAGNOSIS — F411 Generalized anxiety disorder: Secondary | ICD-10-CM | POA: Diagnosis not present

## 2019-11-16 DIAGNOSIS — M170 Bilateral primary osteoarthritis of knee: Secondary | ICD-10-CM | POA: Diagnosis not present

## 2019-11-17 DIAGNOSIS — J449 Chronic obstructive pulmonary disease, unspecified: Secondary | ICD-10-CM | POA: Diagnosis not present

## 2019-11-17 DIAGNOSIS — M84351D Stress fracture, right femur, subsequent encounter for fracture with routine healing: Secondary | ICD-10-CM | POA: Diagnosis not present

## 2019-11-17 DIAGNOSIS — M170 Bilateral primary osteoarthritis of knee: Secondary | ICD-10-CM | POA: Diagnosis not present

## 2019-11-17 DIAGNOSIS — I5032 Chronic diastolic (congestive) heart failure: Secondary | ICD-10-CM | POA: Diagnosis not present

## 2019-11-17 DIAGNOSIS — F411 Generalized anxiety disorder: Secondary | ICD-10-CM | POA: Diagnosis not present

## 2019-11-17 DIAGNOSIS — I2510 Atherosclerotic heart disease of native coronary artery without angina pectoris: Secondary | ICD-10-CM | POA: Diagnosis not present

## 2019-11-22 DIAGNOSIS — J449 Chronic obstructive pulmonary disease, unspecified: Secondary | ICD-10-CM | POA: Diagnosis not present

## 2019-11-22 DIAGNOSIS — M84351D Stress fracture, right femur, subsequent encounter for fracture with routine healing: Secondary | ICD-10-CM | POA: Diagnosis not present

## 2019-11-22 DIAGNOSIS — I2510 Atherosclerotic heart disease of native coronary artery without angina pectoris: Secondary | ICD-10-CM | POA: Diagnosis not present

## 2019-11-22 DIAGNOSIS — F411 Generalized anxiety disorder: Secondary | ICD-10-CM | POA: Diagnosis not present

## 2019-11-22 DIAGNOSIS — M170 Bilateral primary osteoarthritis of knee: Secondary | ICD-10-CM | POA: Diagnosis not present

## 2019-11-22 DIAGNOSIS — I5032 Chronic diastolic (congestive) heart failure: Secondary | ICD-10-CM | POA: Diagnosis not present

## 2019-11-23 DIAGNOSIS — D62 Acute posthemorrhagic anemia: Secondary | ICD-10-CM | POA: Diagnosis not present

## 2019-11-23 DIAGNOSIS — Z7982 Long term (current) use of aspirin: Secondary | ICD-10-CM | POA: Diagnosis not present

## 2019-11-23 DIAGNOSIS — Z7901 Long term (current) use of anticoagulants: Secondary | ICD-10-CM | POA: Diagnosis not present

## 2019-11-23 DIAGNOSIS — F411 Generalized anxiety disorder: Secondary | ICD-10-CM | POA: Diagnosis not present

## 2019-11-23 DIAGNOSIS — E559 Vitamin D deficiency, unspecified: Secondary | ICD-10-CM | POA: Diagnosis not present

## 2019-11-23 DIAGNOSIS — Z9981 Dependence on supplemental oxygen: Secondary | ICD-10-CM | POA: Diagnosis not present

## 2019-11-23 DIAGNOSIS — J449 Chronic obstructive pulmonary disease, unspecified: Secondary | ICD-10-CM | POA: Diagnosis not present

## 2019-11-23 DIAGNOSIS — J9611 Chronic respiratory failure with hypoxia: Secondary | ICD-10-CM | POA: Diagnosis not present

## 2019-11-23 DIAGNOSIS — I5032 Chronic diastolic (congestive) heart failure: Secondary | ICD-10-CM | POA: Diagnosis not present

## 2019-11-23 DIAGNOSIS — Z952 Presence of prosthetic heart valve: Secondary | ICD-10-CM | POA: Diagnosis not present

## 2019-11-23 DIAGNOSIS — I2510 Atherosclerotic heart disease of native coronary artery without angina pectoris: Secondary | ICD-10-CM | POA: Diagnosis not present

## 2019-11-23 DIAGNOSIS — G4700 Insomnia, unspecified: Secondary | ICD-10-CM | POA: Diagnosis not present

## 2019-11-23 DIAGNOSIS — Z7951 Long term (current) use of inhaled steroids: Secondary | ICD-10-CM | POA: Diagnosis not present

## 2019-11-23 DIAGNOSIS — M84351D Stress fracture, right femur, subsequent encounter for fracture with routine healing: Secondary | ICD-10-CM | POA: Diagnosis not present

## 2019-11-23 DIAGNOSIS — M170 Bilateral primary osteoarthritis of knee: Secondary | ICD-10-CM | POA: Diagnosis not present

## 2019-11-23 DIAGNOSIS — M797 Fibromyalgia: Secondary | ICD-10-CM | POA: Diagnosis not present

## 2019-11-25 ENCOUNTER — Ambulatory Visit: Payer: Self-pay

## 2019-11-28 DIAGNOSIS — M25551 Pain in right hip: Secondary | ICD-10-CM | POA: Diagnosis not present

## 2019-11-29 DIAGNOSIS — F411 Generalized anxiety disorder: Secondary | ICD-10-CM | POA: Diagnosis not present

## 2019-11-29 DIAGNOSIS — M170 Bilateral primary osteoarthritis of knee: Secondary | ICD-10-CM | POA: Diagnosis not present

## 2019-11-29 DIAGNOSIS — J449 Chronic obstructive pulmonary disease, unspecified: Secondary | ICD-10-CM | POA: Diagnosis not present

## 2019-11-29 DIAGNOSIS — M84351D Stress fracture, right femur, subsequent encounter for fracture with routine healing: Secondary | ICD-10-CM | POA: Diagnosis not present

## 2019-11-29 DIAGNOSIS — I2510 Atherosclerotic heart disease of native coronary artery without angina pectoris: Secondary | ICD-10-CM | POA: Diagnosis not present

## 2019-11-29 DIAGNOSIS — I5032 Chronic diastolic (congestive) heart failure: Secondary | ICD-10-CM | POA: Diagnosis not present

## 2019-12-01 ENCOUNTER — Ambulatory Visit: Payer: Medicare Other | Attending: Internal Medicine

## 2019-12-01 DIAGNOSIS — Z23 Encounter for immunization: Secondary | ICD-10-CM

## 2019-12-01 NOTE — Progress Notes (Signed)
   Covid-19 Vaccination Clinic  Name:  PITA MACFADYEN    MRN: [id number] DOB: 07/21/1926  12/01/2019  Ms. Crespi was observed post Covid-19 immunization for  15 minutes without incidence. She was provided with Vaccine Information Sheet and instruction to access the V-Safe system.   Ms. Zampino was instructed to call 911 with any severe reactions post vaccine: Marland Kitchen Difficulty breathing  . Swelling of your face and throat  . A fast heartbeat  . A bad rash all over your body  . Dizziness and weakness    Immunizations Administered    Name Date Dose VIS Date Route   Pfizer COVID-19 Vaccine 12/01/2019 12:40 PM 0.3 mL 10/07/2019 Intramuscular   Manufacturer: [place]   Lot: [id number]   [place]: [id number]

## 2019-12-03 ENCOUNTER — Other Ambulatory Visit: Payer: Self-pay | Admitting: Family Medicine

## 2019-12-03 DIAGNOSIS — G4700 Insomnia, unspecified: Secondary | ICD-10-CM

## 2019-12-03 DIAGNOSIS — F411 Generalized anxiety disorder: Secondary | ICD-10-CM

## 2019-12-05 NOTE — Telephone Encounter (Signed)
Last filled 09/09/2019, last OV 07/2019.

## 2019-12-27 ENCOUNTER — Ambulatory Visit: Payer: Medicare Other | Attending: Internal Medicine

## 2019-12-27 DIAGNOSIS — Z23 Encounter for immunization: Secondary | ICD-10-CM | POA: Insufficient documentation

## 2019-12-27 NOTE — Progress Notes (Signed)
   Covid-19 Vaccination Clinic  Name:  Jessica [name]    MRN: [id number] DOB: 1926/02/17  12/27/2019  [name] was observed post Covid-19 immunization for 15 minutes without incident. She was provided with Vaccine Information Sheet and instruction to access the V-Safe system.   [name] was instructed to call 911 with any severe reactions post vaccine: Marland Kitchen Difficulty breathing  . Swelling of face and throat  . A fast heartbeat  . A bad rash all over body  . Dizziness and weakness   Immunizations Administered    Name Date Dose VIS Date Route   Pfizer COVID-19 Vaccine 12/27/2019 10:52 AM 0.3 mL 10/07/2019 Intramuscular   Manufacturer: [place]   Lot: [id number]   [place]: [id number]

## 2020-01-10 DIAGNOSIS — H43821 Vitreomacular adhesion, right eye: Secondary | ICD-10-CM | POA: Diagnosis not present

## 2020-01-10 DIAGNOSIS — H353222 Exudative age-related macular degeneration, left eye, with inactive choroidal neovascularization: Secondary | ICD-10-CM | POA: Diagnosis not present

## 2020-01-10 DIAGNOSIS — H353213 Exudative age-related macular degeneration, right eye, with inactive scar: Secondary | ICD-10-CM | POA: Diagnosis not present

## 2020-01-10 DIAGNOSIS — H31093 Other chorioretinal scars, bilateral: Secondary | ICD-10-CM | POA: Diagnosis not present

## 2020-01-16 ENCOUNTER — Encounter: Payer: Self-pay | Admitting: Family Medicine

## 2020-01-16 ENCOUNTER — Ambulatory Visit (INDEPENDENT_AMBULATORY_CARE_PROVIDER_SITE_OTHER): Payer: Medicare Other | Admitting: Family Medicine

## 2020-01-16 VITALS — BP 124/80 | HR 96 | Resp 20 | Ht 64.0 in

## 2020-01-16 DIAGNOSIS — M5442 Lumbago with sciatica, left side: Secondary | ICD-10-CM | POA: Diagnosis not present

## 2020-01-16 DIAGNOSIS — G4700 Insomnia, unspecified: Secondary | ICD-10-CM | POA: Diagnosis not present

## 2020-01-16 DIAGNOSIS — J9610 Chronic respiratory failure, unspecified whether with hypoxia or hypercapnia: Secondary | ICD-10-CM

## 2020-01-16 DIAGNOSIS — I5032 Chronic diastolic (congestive) heart failure: Secondary | ICD-10-CM

## 2020-01-16 DIAGNOSIS — M5441 Lumbago with sciatica, right side: Secondary | ICD-10-CM | POA: Diagnosis not present

## 2020-01-16 DIAGNOSIS — I700 Atherosclerosis of aorta: Secondary | ICD-10-CM | POA: Diagnosis not present

## 2020-01-16 DIAGNOSIS — F411 Generalized anxiety disorder: Secondary | ICD-10-CM | POA: Diagnosis not present

## 2020-01-16 LAB — POCT URINALYSIS DIPSTICK
Blood, UA: NEGATIVE
Glucose, UA: NEGATIVE
Leukocytes, UA: NEGATIVE
Nitrite, UA: NEGATIVE
Protein, UA: POSITIVE — AB
Urobilinogen, UA: 2 E.U./dL — AB
pH, UA: 5.5 (ref 5.0–8.0)

## 2020-01-16 MED ORDER — GABAPENTIN 100 MG PO CAPS: 100.0000 mg | ORAL_CAPSULE | Freq: Three times a day (TID) | ORAL | 3 refills | Status: DC

## 2020-01-16 MED ORDER — HYDROCODONE-ACETAMINOPHEN 5-325 MG PO TABS: 0.5000 | ORAL_TABLET | Freq: Two times a day (BID) | ORAL | 0 refills | Status: DC | PRN

## 2020-01-16 MED ORDER — ALPRAZOLAM 0.5 MG PO TABS: 0.5000 mg | ORAL_TABLET | Freq: Two times a day (BID) | ORAL | 1 refills | Status: DC | PRN

## 2020-01-16 NOTE — Patient Instructions (Addendum)
A few things to remember from today's visit:  Opioid medications like hydrocodone could increase difficulty breathing, increase the risk for pneumonia, increase the risk for falls among some. Today I am recommending gabapentin 100 mg at bedtime. We could also try low-dose hydrocodone-Acetaminophen, medication you used in the past. Fall precautions.  Remember that she is on Xanax, which can also cause respiratory failure, mainly when taking with opioids.   Let me know if you would agree with palliative evaluation.  Please be sure medication list is accurate. If a new problem present, please set up appointment sooner than planned today.

## 2020-01-16 NOTE — Progress Notes (Signed)
ACUTE VISIT   HPI:  Chief Complaint  Patient presents with  . Back Pain    Jessica [name] is a 86 y.o. female, who is here today with her daughter complaining of 3-4 weeks of low back pain radiated to LE's.  Negative for fever, chills, skin rash, saddle anesthesia, or changes in bowel/bladder function. No history of trauma.  She was already evaluated by orthopedist. According to the daughter, she received an epidural injection, which did not seem to help. Her daughter takes hydrocodone-acetaminophen 7.5-325 mg, she gave her 1/2 tablet. Today pain is not as bad.  She is convinced she has a UTI.  Right stress fracture 08/2019. No falls recently.  Negative for dysuria, increased urine frequency, decreased urine output, or gross hematuria. She has not had abdominal pain, N/V, or changes in bowel habits.  Her daughter takes Hydrocodone-Acetaminophen 7.5-325 mg, she gave her 1/2 tab a couple times and it seemed to help.  Anxiety: Problem has been aggravated by recent health issues. She is on Lexapro 20 mg daily and Alprazola, 0.5 mg bid prn. Alprazolam also helps her sleep.  COPD: Productive cough,wheezing,and SOB exacerbated by minimal exertion and reported as stable. She is on Albuterol inh and when more severe symptoms neb treatments with Albuterol sl. She is also on Pulmicort neb bid and Brovana neb bid. She is on supplemental O2 as needed 2 LPM.  She has not had fever,chills, CP,or syncope. She follows with pulmonologist.  CAD s/p stent placement in 06/2018. Severe AS, s/p TAVR in 03/2019.   Review of Systems  Constitutional: Positive for activity change and fatigue. Negative for appetite change.  HENT: Negative for mouth sores and sore throat.   Eyes: Negative for redness and visual disturbance.  Cardiovascular: Negative for leg swelling.  Gastrointestinal: Negative for abdominal pain, blood in stool, nausea and vomiting.       Negative for changes in  bowel habits.  Genitourinary: Negative for difficulty urinating and vaginal bleeding.  Musculoskeletal: Positive for arthralgias, back pain and myalgias. Negative for joint swelling.  Skin: Negative for rash and wound.  Allergic/Immunologic: Positive for environmental allergies.  Neurological: Negative for facial asymmetry and headaches.  Hematological: Negative for adenopathy.  Psychiatric/Behavioral: Negative for confusion. The patient is nervous/anxious.   Rest see pertinent positives and negatives per HPI.   Current Outpatient Medications on File Prior to Visit  Medication Sig Dispense Refill  . acetaminophen (TYLENOL) 500 MG tablet Take 2 tablets (1,000 mg total) by mouth every 8 (eight) hours as needed for mild pain, moderate pain or headache (pain). 30 tablet 0  . albuterol (PROVENTIL) (2.5 MG/3ML) 0.083% nebulizer solution USE 1 VIAL IN NEBULIZER 6 TIMES DAILY - (for rescue) (Max 30 doses per month) 75 mL 11  . albuterol (VENTOLIN HFA) 108 (90 Base) MCG/ACT inhaler Inhale 2 puffs into the lungs every 6 (six) hours as needed for wheezing. 18 g 1  . AMBULATORY NON FORMULARY MEDICATION Medication Name: Incentive spirometer Use 10-15 times per day 1 each 0  . aspirin EC 81 MG tablet Take 1 tablet (81 mg total) by mouth daily. (Patient taking differently: Take 81 mg by mouth at bedtime. )    . BROVANA 15 MCG/2ML NEBU USE 1 VIAL  IN  NEBULIZER TWICE  DAILY - morning and evening 210 mL 11  . budesonide (PULMICORT) 0.5 MG/2ML nebulizer solution USE 1 VIAL  IN  NEBULIZER TWICE  DAILY - Rinse mouth after treatment 210 mL 11  .  calcium-vitamin D (OSCAL WITH D) 500-200 MG-UNIT tablet Take 2 tablets by mouth daily with breakfast. 180 tablet 0  . escitalopram (LEXAPRO) 20 MG tablet Take 1 tablet (20 mg total) by mouth daily. (Patient taking differently: Take 20 mg by mouth every morning. ) 90 tablet 1  . ferrous sulfate 325 (65 FE) MG tablet Take 1 tablet (325 mg total) by mouth daily. 90 tablet 3    . fluticasone (FLONASE) 50 MCG/ACT nasal spray Place 2 sprays into both nostrils daily. (Patient taking differently: Place 2 sprays into both nostrils daily as needed for allergies or rhinitis. ) 16 g 0  . loratadine (CLARITIN) 10 MG tablet Take 1 tablet (10 mg total) by mouth daily. 10 tablet 0  . polyethylene glycol (MIRALAX / GLYCOLAX) 17 g packet Take 17 g by mouth daily as needed for mild constipation. 14 each 0  . Respiratory Therapy Supplies (FLUTTER) DEVI 1 each by Does not apply route daily. 1 each 0  . senna-docusate (SENOKOT-S) 8.6-50 MG tablet Take 2 tablets by mouth 2 (two) times daily as needed for mild constipation or moderate constipation. 60 tablet 0  . simvastatin (ZOCOR) 20 MG tablet Take 1 tablet (20 mg total) by mouth at bedtime. 90 tablet 3  . triamcinolone cream (KENALOG) 0.1 % Apply 1 application topically 2 (two) times daily as needed (rash). 28.4 g 1  . Vitamin D, Ergocalciferol, (DRISDOL) 1.25 MG (50000 UT) CAPS capsule Take 1 capsule (50,000 Units total) by mouth every 7 (seven) days. 7 capsule 0  . furosemide (LASIX) 20 MG tablet Take 1 tablet (20 mg total) by mouth daily as needed for fluid or edema (sob or weight gain). 90 tablet 3   No current facility-administered medications on file prior to visit.    Past Medical History:  Diagnosis Date  . Anxiety   . congenital nystagmus   . COPD (chronic obstructive pulmonary disease) ([place])   . Coronary artery disease   . DJD (degenerative joint disease)   . Fibromyalgia   . Low back pain syndrome   . Memory loss   . Other and unspecified hyperlipidemia   . Pancreatic lesion   . Pneumonia   . S/P TAVR (transcatheter aortic valve replacement)    26 mm Edwards Sapien 3 via the TF approach  . Severe aortic stenosis   . Venous insufficiency    Allergies  Allergen Reactions  . Contrast Media [Iodinated Diagnostic Agents] Itching, Rash and Other (See Comments)    Hypotension, Skin turns red like a sunburn  .  Iohexol Itching, Rash and Other (See Comments)    Hypotension, Skin turns red like a sunburn    Social History   Socioeconomic History  . Marital status: Widowed    Spouse name: Not on file  . Number of children: 2  . Years of education: Not on file  . Highest education level: Not on file  Occupational History  . 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 Smoking status: Former Smoker    Packs/day: 2.00    Years: 30.00    Pack years: 60.00    Types: Cigarettes    Quit date: 10/28/1991    Years since quitting: 28.2  . Smokeless tobacco: Never Used  Substance and Sexual Activity  . Alcohol use: Yes    Comment: 1 drink per night  . Drug use: No  . Sexual activity: Not Currently  Other Topics Concern  . Not on file  Social History Narrative  1 sibling alive age 52   1 sibling alive age 33   1 sibling deceased age 52   1 sibling alive age 45   Social Determinants of Radio broadcast assistant Strain:   . Difficulty of Paying Living Expenses:   Food Insecurity:   . Worried About Charity fundraiser in the Last Year:   . Arboriculturist in the Last Year:   Transportation Needs:   . Film/video editor (Medical):   Marland Kitchen Lack of Transportation (Non-Medical):   Physical Activity:   . Days of Exercise per Week:   . Minutes of Exercise per Session:   Stress:   . Feeling of Stress :   Social Connections:   . Frequency of Communication with Friends and Family:   . Frequency of Social Gatherings with Friends and Family:   . Attends Religious Services:   . Active Member of Clubs or Organizations:   . Attends Archivist Meetings:   Marland Kitchen Marital Status:     Vitals:   01/16/20 1355  BP: 124/80  Pulse: 96  Resp: 20  SpO2: 92%   Body mass index is 20.6 kg/m.  Physical Exam  Nursing note and vitals reviewed. Constitutional: She appears well-developed and well-nourished. She does not appear ill. No distress.  HENT:  Head: Normocephalic and  atraumatic.  Mouth/Throat: Mucous membranes are normal. She has dentures.  Eyes: Conjunctivae are normal.  Cardiovascular: Normal rate and regular rhythm.  Respiratory: Effort normal. No respiratory distress. She has decreased breath sounds in the right upper field, the right middle field, the right lower field, the left upper field, the left middle field and the left lower field. She has no wheezes. She has no rhonchi. She has no rales.  GI: Soft. There is no abdominal tenderness.  Musculoskeletal:        General: Edema (Trace pitting LE edema,bilateral) present.     Lumbar back: No tenderness or bony tenderness.  Neurological: She is alert. She has normal strength. Gait abnormal.  SLR negative bilateral. She is in her wheel chair. Oriented x 3.  Skin: Skin is warm. No rash noted. No erythema.  Psychiatric: Her mood appears anxious.  Well groomed, good eye contact.   ASSESSMENT AND PLAN:  Jessica [name] was seen today for back pain.  Diagnoses and all orders for this visit:  Bilateral low back pain with bilateral sciatica, unspecified chronicity She is not a good candidate for surgical treatment. She has taken opioids in the past and well tolerated. She does not have many options in regard to analgesic therapy due to CV disease. She and her daughter understand side effects of opioids and the possibility of interaction with benzodiazepines. In her case we needs to be more cautious given ehr hx of respiratory failure/severe COPD.  Gabapentin may also help with LE pain,which seems to be radicular. Fall precautions.  -     gabapentin (NEURONTIN) 100 MG capsule; Take 1 capsule (100 mg total) by mouth 3 (three) times daily. -     HYDROcodone-acetaminophen (NORCO) 5-325 MG tablet; Take 0.5 tablets by mouth every 12 (twelve) hours as needed for severe pain. -     POCT urinalysis dipstick  Insomnia, unspecified type Continue Alprazolam 0.5 mg 1/2-1 tab at bedtime Good sleep hygiene also  recommended.  -     ALPRAZolam (XANAX) 0.5 MG tablet; Take 1 tablet (0.5 mg total) by mouth 2 (two) times daily as needed for anxiety.  Generalized anxiety disorder  Stable. We discussed side effects of medications. No changes in current management.  -     ALPRAZolam (XANAX) 0.5 MG tablet; Take 1 tablet (0.5 mg total) by mouth 2 (two) times daily as needed for anxiety.  Chronic diastolic (congestive) heart failure (HCC) Not having orthopnea or PND and no edema. Continue following with cardiologist.  Aortic atherosclerosis ([place]) On Simvastatin 20 mg daily and Aspirin 81 mg. She understands side effects of aspirine.  Chronic respiratory failure, unspecified whether with hypoxia or hypercapnia ([place]) Continue current management. O2 supplementation 2 LPM as needed. Per daughter report,it seems to be stable. Follows with pulmonologist.   Return in about 4 weeks (around 02/13/2020).   Rosmarie Esquibel G. [name], MD  [facility]. [place] office.   A few things to remember from today's visit:  Opioid medications like hydrocodone could increase difficulty breathing, increase the risk for pneumonia, increase the risk for falls among some. Today I am recommending gabapentin 100 mg at bedtime. We could also try low-dose hydrocodone-Acetaminophen, medication you used in the past. Fall precautions.  Remember that she is on Xanax, which can also cause respiratory failure, mainly when taking with opioids.   Let me know if you would agree with palliative evaluation.  Please be sure medication list is accurate. If a new problem present, please set up appointment sooner than planned today.

## 2020-02-02 ENCOUNTER — Telehealth: Payer: Self-pay | Admitting: Family Medicine

## 2020-02-02 NOTE — Telephone Encounter (Signed)
Message Routed to PCP for review and approval. 

## 2020-02-02 NOTE — Telephone Encounter (Signed)
Jessica from [facility] is requesting refill for pt for medication Hydrocodone. Last prescription insurance only paid for 7 since it had been awhile that it had been prescribed. Pharmacy had to void the other 13. Pt would now like a refill for 20 again since she only received 7.

## 2020-02-07 ENCOUNTER — Other Ambulatory Visit: Payer: Self-pay | Admitting: Family Medicine

## 2020-02-07 DIAGNOSIS — M5441 Lumbago with sciatica, right side: Secondary | ICD-10-CM

## 2020-02-07 DIAGNOSIS — M5442 Lumbago with sciatica, left side: Secondary | ICD-10-CM

## 2020-02-07 MED ORDER — HYDROCODONE-ACETAMINOPHEN 5-325 MG PO TABS: 0.5000 | ORAL_TABLET | Freq: Two times a day (BID) | ORAL | 0 refills | Status: DC | PRN

## 2020-02-07 NOTE — Telephone Encounter (Signed)
Rx sent. Remind pt to arrange f/u appt as recommended last visit to follow on pain medication. Thanks, BJ

## 2020-02-07 NOTE — Telephone Encounter (Signed)
Called patient and spoke to daughter and gave her message and set up follow up for 02/15/20. Daughter verbalized an understanding.

## 2020-02-11 ENCOUNTER — Other Ambulatory Visit: Payer: Self-pay | Admitting: Internal Medicine

## 2020-02-11 DIAGNOSIS — J449 Chronic obstructive pulmonary disease, unspecified: Secondary | ICD-10-CM

## 2020-02-14 ENCOUNTER — Telehealth: Payer: Self-pay | Admitting: Family Medicine

## 2020-02-14 NOTE — Chronic Care Management (AMB) (Signed)
  Chronic Care Management   Note  02/14/2020 Name: EASTLYN BRANCO MRN: [id number] DOB: Oct 17, 1926  MAKYNLEE QUINDE is a 86 y.o. year old female who is a primary care patient of [name], Malka So, MD. I reached out to [name] by phone today in response to a referral sent by Ms. Tawny Asal Loa's PCP, [name], [name], MD.   Ms. Saggio was given information about Chronic Care Management services today including:  1. CCM service includes personalized support from designated clinical staff supervised by her physician, including individualized plan of care and coordination with other care providers 2. 24/7 contact phone numbers for assistance for urgent and routine care needs. 3. Service will only be billed when office clinical staff spend 20 minutes or more in a month to coordinate care. 4. Only one practitioner may furnish and bill the service in a calendar month. 5. The patient may stop CCM services at any time (effective at the end of the month) by phone call to the office staff.   Patient agreed to services and verbal consent obtained.   Follow up plan:   Raynicia Dukes [employer] Scheduler

## 2020-02-15 ENCOUNTER — Ambulatory Visit (INDEPENDENT_AMBULATORY_CARE_PROVIDER_SITE_OTHER): Payer: Medicare Other | Admitting: Family Medicine

## 2020-02-15 ENCOUNTER — Encounter: Payer: Self-pay | Admitting: Family Medicine

## 2020-02-15 ENCOUNTER — Other Ambulatory Visit: Payer: Self-pay

## 2020-02-15 VITALS — BP 120/80 | HR 86 | Temp 97.6°F | Resp 20 | Ht 64.0 in | Wt 125.5 lb

## 2020-02-15 DIAGNOSIS — M5442 Lumbago with sciatica, left side: Secondary | ICD-10-CM | POA: Diagnosis not present

## 2020-02-15 DIAGNOSIS — M5441 Lumbago with sciatica, right side: Secondary | ICD-10-CM | POA: Diagnosis not present

## 2020-02-15 DIAGNOSIS — F411 Generalized anxiety disorder: Secondary | ICD-10-CM

## 2020-02-15 DIAGNOSIS — Z79899 Other long term (current) drug therapy: Secondary | ICD-10-CM | POA: Diagnosis not present

## 2020-02-15 MED ORDER — TIZANIDINE HCL 2 MG PO TABS: 1.0000 mg | ORAL_TABLET | Freq: Every evening | ORAL | 0 refills | Status: DC | PRN

## 2020-02-15 MED ORDER — GABAPENTIN 100 MG PO CAPS: 100.0000 mg | ORAL_CAPSULE | Freq: Two times a day (BID) | ORAL | 3 refills | Status: DC

## 2020-02-15 NOTE — Patient Instructions (Signed)
A few things to remember from today's visit:   Zanaflex added today, 1/2-1 tab at bedtime as needed. Continue gabapentin 100 mg 2 times daily. Hydrocodone-Acetaminophen 1/2 tb 2 times daily as needed.  All these meds can increase risk for falls and some can decrease respiratory drive,so we have to monitor closely.   Please be sure medication list is accurate. If a new problem present, please set up appointment sooner than planned today.

## 2020-02-15 NOTE — Progress Notes (Signed)
HPI:  [name] is a 86 y.o. female with hx of fibromyalgia,anxiety,CAD,COPD here today with her daughter for follow up.  Hearing loss,so her daughter helps me with interrogation.  She was last seen on 01/16/20, when Hydrocodone-Acetaminophen 5-325 mg was started to treat lower back pain with radiation to LE's. She takes Hydrocodone-Acetaminophen 1/2 tab bid as needed. She also started Gabapentin 100 mg , taking it bid. She has tolerated medications well.  Pain has improved. According to her daughter,she has "good days and bad days." Today is a good day,she is not having pain.  She is following with Dr Jessica [name], ortho. Her daughter would like a Rx for Zanaflex , states that she (daughter) has taken for back pain before and helped.  No changes in bowel habits,N/V,or changes in bowel/bladder function.  Anxiety: She is on Alprazolam 0.5 mg bid as needed and Lexapro 20 mg daily. Problem exacerbated by pain and other health problems. No depressed mood.  Review of Systems  Constitutional: Negative for activity change, appetite change and fever.  HENT: Negative for nosebleeds and sore throat.   Respiratory: Positive for cough, shortness of breath and wheezing.        COPD symptoms stable.  Gastrointestinal: Negative for abdominal pain.       Negative for changes in bowel habits.  Genitourinary: Negative for decreased urine volume, dysuria and hematuria.  Musculoskeletal: Positive for arthralgias, back pain and gait problem.  Skin: Negative for pallor and wound.  Allergic/Immunologic: Positive for environmental allergies.  Neurological: Negative for syncope and headaches.  Psychiatric/Behavioral: Positive for sleep disturbance. Negative for confusion. The patient is nervous/anxious.   Rest of ROS, see pertinent positives sand negatives in HPI  Current Outpatient Medications on File Prior to Visit  Medication Sig Dispense Refill  . acetaminophen (TYLENOL) 500 MG tablet  Take 2 tablets (1,000 mg total) by mouth every 8 (eight) hours as needed for mild pain, moderate pain or headache (pain). 30 tablet 0  . albuterol (PROVENTIL) (2.5 MG/3ML) 0.083% nebulizer solution USE 1 VIAL IN NEBULIZER 6 TIMES DAILY - (for rescue) (Max 30 doses per month) 75 mL 11  . albuterol (VENTOLIN HFA) 108 (90 Base) MCG/ACT inhaler TAKE 2 PUFFS BY MOUTH EVERY 6 HOURS AS NEEDED FOR WHEEZE 18 g 0  . ALPRAZolam (XANAX) 0.5 MG tablet Take 1 tablet (0.5 mg total) by mouth 2 (two) times daily as needed for anxiety. 60 tablet 1  . AMBULATORY NON FORMULARY MEDICATION Medication Name: Incentive spirometer Use 10-15 times per day 1 each 0  . aspirin EC 81 MG tablet Take 1 tablet (81 mg total) by mouth daily. (Patient taking differently: Take 81 mg by mouth at bedtime. )    . BROVANA 15 MCG/2ML NEBU USE 1 VIAL  IN  NEBULIZER TWICE  DAILY - morning and evening 210 mL 11  . budesonide (PULMICORT) 0.5 MG/2ML nebulizer solution USE 1 VIAL  IN  NEBULIZER TWICE  DAILY - Rinse mouth after treatment 210 mL 11  . calcium-vitamin D (OSCAL WITH D) 500-200 MG-UNIT tablet Take 2 tablets by mouth daily with breakfast. 180 tablet 0  . escitalopram (LEXAPRO) 20 MG tablet Take 1 tablet (20 mg total) by mouth daily. (Patient taking differently: Take 20 mg by mouth every morning. ) 90 tablet 1  . ferrous sulfate 325 (65 FE) MG tablet Take 1 tablet (325 mg total) by mouth daily. 90 tablet 3  . fluticasone (FLONASE) 50 MCG/ACT nasal spray Place 2 sprays  into both nostrils daily. (Patient taking differently: Place 2 sprays into both nostrils daily as needed for allergies or rhinitis. ) 16 g 0  . HYDROcodone-acetaminophen (NORCO) 5-325 MG tablet Take 0.5 tablets by mouth every 12 (twelve) hours as needed for severe pain. 20 tablet 0  . loratadine (CLARITIN) 10 MG tablet Take 1 tablet (10 mg total) by mouth daily. 10 tablet 0  . polyethylene glycol (MIRALAX / GLYCOLAX) 17 g packet Take 17 g by mouth daily as needed for mild  constipation. 14 each 0  . Respiratory Therapy Supplies (FLUTTER) DEVI 1 each by Does not apply route daily. 1 each 0  . senna-docusate (SENOKOT-S) 8.6-50 MG tablet Take 2 tablets by mouth 2 (two) times daily as needed for mild constipation or moderate constipation. 60 tablet 0  . simvastatin (ZOCOR) 20 MG tablet Take 1 tablet (20 mg total) by mouth at bedtime. 90 tablet 3  . triamcinolone cream (KENALOG) 0.1 % Apply 1 application topically 2 (two) times daily as needed (rash). 28.4 g 1  . Vitamin D, Ergocalciferol, (DRISDOL) 1.25 MG (50000 UT) CAPS capsule Take 1 capsule (50,000 Units total) by mouth every 7 (seven) days. 7 capsule 0  . furosemide (LASIX) 20 MG tablet Take 1 tablet (20 mg total) by mouth daily as needed for fluid or edema (sob or weight gain). 90 tablet 3   No current facility-administered medications on file prior to visit.     Past Medical History:  Diagnosis Date  . Anxiety   . congenital nystagmus   . COPD (chronic obstructive pulmonary disease) ([place])   . Coronary artery disease   . DJD (degenerative joint disease)   . Fibromyalgia   . Low back pain syndrome   . Memory loss   . Other and unspecified hyperlipidemia   . Pancreatic lesion   . Pneumonia   . S/P TAVR (transcatheter aortic valve replacement)    26 mm Edwards Sapien 3 via the TF approach  . Severe aortic stenosis   . Venous insufficiency    Allergies  Allergen Reactions  . Contrast Media [Iodinated Diagnostic Agents] Itching, Rash and Other (See Comments)    Hypotension, Skin turns red like a sunburn  . Iohexol Itching, Rash and Other (See Comments)    Hypotension, Skin turns red like a sunburn    Social History   Socioeconomic History  . Marital status: Widowed    Spouse name: Not on file  . Number of children: 2  . Years of education: Not on file  . Highest education level: Not on file  Occupational History  . 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 Smoking status:  Former Smoker    Packs/day: 2.00    Years: 30.00    Pack years: 60.00    Types: Cigarettes    Quit date: 10/28/1991    Years since quitting: 28.3  . Smokeless tobacco: Never Used  Substance and Sexual Activity  . Alcohol use: Yes    Comment: 1 drink per night  . Drug use: No  . Sexual activity: Not Currently  Other Topics Concern  . Not on file  Social History Narrative   1 sibling alive age 68   1 sibling alive age 22   1 sibling deceased age 82   1 sibling alive age 43   Social Determinants of Radio broadcast assistant Strain:   . Difficulty of Paying Living Expenses:   Food Insecurity:   . Worried About Estate manager/land agent  of Food in the Last Year:   . [place] in the Last Year:   Transportation Needs:   . Lack of Transportation (Medical):   Marland Kitchen Lack of Transportation (Non-Medical):   Physical Activity:   . Days of Exercise per Week:   . Minutes of Exercise per Session:   Stress:   . Feeling of Stress :   Social Connections:   . Frequency of Communication with Friends and Family:   . Frequency of Social Gatherings with Friends and Family:   . Attends Religious Services:   . Active Member of Clubs or Organizations:   . Attends Archivist Meetings:   Marland Kitchen Marital Status:     Vitals:   02/15/20 1009  BP: 120/80  Pulse: 86  Resp: 20  Temp: 97.6 F (36.4 C)  SpO2: 93%   Wt Readings from Last 3 Encounters:  02/15/20 125 lb 8 oz (56.9 kg)  10/05/19 120 lb (54.4 kg)  09/19/19 130 lb (59 kg)   Body mass index is 21.54 kg/m.   Physical Exam  Nursing note and vitals reviewed. Constitutional: She is oriented to person, place, and time. She appears well-developed and well-nourished. No distress.  HENT:  Head: Normocephalic and atraumatic.  Right Ear: Decreased hearing is noted.  Left Ear: Decreased hearing is noted.  Eyes: Pupils are equal, round, and reactive to light. Conjunctivae are normal.  Cardiovascular: Normal rate and regular rhythm.    Respiratory: Effort normal and breath sounds normal. No respiratory distress.  GI: Soft. She exhibits no mass. There is no abdominal tenderness.  Musculoskeletal:        General: No edema.     Lumbar back: No tenderness or bony tenderness.  Neurological: She is alert and oriented to person, place, and time. She has normal strength. No cranial nerve deficit. Gait abnormal.  In a wheel chair.  Skin: Skin is warm. No rash noted. No erythema.  Raised rounded lesion on back,mildly irritated from scratching,keratotic. See picture.  Psychiatric: Her mood appears anxious.  Well groomed.    ASSESSMENT AND PLAN:   [name] [facility] was seen today for follow-up.  1. Bilateral low back pain with bilateral sciatica, unspecified chronicity Improved. Zanaflex 2 mg , to take 1/2-1 tab at bedtime if needed. No changes in rest of medications. Med contract signed today. [place] controlled subst report reviewed.  - tiZANidine (ZANAFLEX) 2 MG tablet; Take 0.5-1 tablets (1-2 mg total) by mouth at bedtime as needed for muscle spasms.  Dispense: 30 tablet; Refill: 0 - gabapentin (NEURONTIN) 100 MG capsule; Take 1 capsule (100 mg total) by mouth 2 (two) times daily.  Dispense: 60 capsule; Refill: 3  2. Generalized anxiety disorder Stable. No changes in current management.  3. Long-term use of high-risk medication We discussed side effects of medications added recently and those she is already taking. Risk for falls,constipation,and respiratory complications. Her daughter is monitoring medications dose and for possible side effects.   Return in about 3 months (around 05/16/2020) for pain and anxiety.    Morayma Godown G. [name], MD  [facility]. [place] office.

## 2020-02-16 ENCOUNTER — Encounter: Payer: Self-pay | Admitting: Family Medicine

## 2020-03-03 ENCOUNTER — Other Ambulatory Visit: Payer: Self-pay | Admitting: Internal Medicine

## 2020-03-03 DIAGNOSIS — J449 Chronic obstructive pulmonary disease, unspecified: Secondary | ICD-10-CM

## 2020-03-09 ENCOUNTER — Telehealth: Payer: Self-pay | Admitting: Family Medicine

## 2020-03-09 NOTE — Telephone Encounter (Signed)
Pt calling to request refill for hydrocodone. Send to [facility] pharmacy on [address].

## 2020-03-12 NOTE — Telephone Encounter (Signed)
Patient need to schedule an ov for more refills. 

## 2020-03-13 ENCOUNTER — Ambulatory Visit: Payer: Medicare Other

## 2020-03-13 ENCOUNTER — Other Ambulatory Visit: Payer: Self-pay

## 2020-03-13 DIAGNOSIS — E785 Hyperlipidemia, unspecified: Secondary | ICD-10-CM

## 2020-03-13 DIAGNOSIS — J449 Chronic obstructive pulmonary disease, unspecified: Secondary | ICD-10-CM

## 2020-03-13 DIAGNOSIS — M5442 Lumbago with sciatica, left side: Secondary | ICD-10-CM

## 2020-03-13 DIAGNOSIS — I5032 Chronic diastolic (congestive) heart failure: Secondary | ICD-10-CM

## 2020-03-13 NOTE — Chronic Care Management (AMB) (Signed)
Chronic Care Management Pharmacy  Name: Jessica [name]  MRN: [id number] DOB: 1926/02/26  Initial Questions: 1. Have you seen any other providers since your last visit? NA 2. Any changes in your medicines or health? No   Chief Complaint/ HPI  Jessica [name],  86 y.o. , female presents for their Initial CCM visit with the clinical pharmacist via telephone due to COVID-19 Pandemic.  PCP : [name], [name], MD  Their chronic conditions include: COPD, HF, HLD, aortic atherosclerosis, CAD, depression/ anxiety, allergic rhinitis, back pain   Office Visits: 02/15/2020- [name], MD- Patient presented for office visit for follow up. Hydrocodone/ APAP was  Started on 01/16/20. It was reported pain improved, with some "good days and bad days" Tizanidine 2mg  as needed for muscle spasms was prescribed. No other changes made. Discussed side effects of medications (risk for falls, constipation, and respiratory complications). Patient to return in 3 months for pain and anxiety.   01/16/2020- [name], MD- Patient presented for office visit for back pain. Anxiety reported to be exacerbated by recent health problems. Patient evaluated by orthopedist and was given epidural injection. It was reported patient tolerated opioids and options may be limited due to CV disease. Gabapentin 100mg  and hydrocodone/ APAP 5/325mg  were refilled. Alprazolam 0.5mg  refilled. No other changes. Patient to follow up in 4 weeks.   Consult Visit: 12/09/200- ED visit- Patient presented with chief complaint of hypoxia.Patient already on 2 L of oxygen. No new oxygen requirements. Patient discharged to [facility] in no acute distress and stable vitals. Patient to continue on 2L of oxygen.   05/11/2019- Cardiology- Alonza Smoker, PA- Patient presented for follow up. Patient reported doing much better and doing more without getting tired. Patient to continue ASA indefinitely. CBC and BMET to be obtained today.    04/07/2019- Cardiology- Alonza Smoker, PA- Patient presented for follow up.Patient reported doing better in terms of breathing. Denied chest pain. Patient started on iron due to chronic anemia. Patient to continue ASA and obtain ECHO in 1 month and obtain BMET and CBC at that time.   Medications: Outpatient Encounter Medications as of 03/13/2020  Medication Sig  . acetaminophen (TYLENOL) 500 MG tablet Take 2 tablets (1,000 mg total) by mouth every 8 (eight) hours as needed for mild pain, moderate pain or headache (pain).  Marland Kitchen albuterol (PROVENTIL) (2.5 MG/3ML) 0.083% nebulizer solution USE 1 VIAL IN NEBULIZER 6 TIMES DAILY - (for rescue) (Max 30 doses per month)  . albuterol (VENTOLIN HFA) 108 (90 Base) MCG/ACT inhaler TAKE 2 PUFFS BY MOUTH EVERY 6 HOURS AS NEEDED FOR WHEEZE  . ALPRAZolam (XANAX) 0.5 MG tablet Take 1 tablet (0.5 mg total) by mouth 2 (two) times daily as needed for anxiety.  Marland Kitchen aspirin EC 81 MG tablet Take 1 tablet (81 mg total) by mouth daily. (Patient taking differently: Take 81 mg by mouth at bedtime. )  . BROVANA 15 MCG/2ML NEBU USE 1 VIAL  IN  NEBULIZER TWICE  DAILY - morning and evening  . budesonide (PULMICORT) 0.5 MG/2ML nebulizer solution USE 1 VIAL  IN  NEBULIZER TWICE  DAILY - Rinse mouth after treatment  . calcium-vitamin D (OSCAL WITH D) 500-200 MG-UNIT tablet Take 2 tablets by mouth daily with breakfast.  . escitalopram (LEXAPRO) 20 MG tablet Take 1 tablet (20 mg total) by mouth daily. (Patient taking differently: Take 20 mg by mouth every morning. )  . fluticasone (FLONASE) 50 MCG/ACT nasal spray Place 2 sprays into both nostrils daily. (Patient  taking differently: Place 2 sprays into both nostrils daily as needed for allergies or rhinitis. )  . furosemide (LASIX) 20 MG tablet Take 1 tablet (20 mg total) by mouth daily as needed for fluid or edema (sob or weight gain).  Marland Kitchen gabapentin (NEURONTIN) 100 MG capsule Take 1 capsule (100 mg total) by mouth 2 (two) times daily.   Marland Kitchen HYDROcodone-acetaminophen (NORCO) 5-325 MG tablet Take 0.5 tablets by mouth every 12 (twelve) hours as needed for severe pain.  Marland Kitchen senna-docusate (SENOKOT-S) 8.6-50 MG tablet Take 2 tablets by mouth 2 (two) times daily as needed for mild constipation or moderate constipation.  Marland Kitchen tiZANidine (ZANAFLEX) 2 MG tablet TAKE 0.5-1 TABLETS (1-2 MG TOTAL) BY MOUTH AT BEDTIME AS NEEDED FOR MUSCLE SPASMS.  . vitamin B-12 (CYANOCOBALAMIN) 1000 MCG tablet Take 1,000 mcg by mouth daily.  . [DISCONTINUED] HYDROcodone-acetaminophen (NORCO) 5-325 MG tablet Take 0.5 tablets by mouth every 12 (twelve) hours as needed for severe pain.  . [DISCONTINUED] tiZANidine (ZANAFLEX) 2 MG tablet Take 0.5-1 tablets (1-2 mg total) by mouth at bedtime as needed for muscle spasms.  . AMBULATORY NON FORMULARY MEDICATION Medication Name: Incentive spirometer Use 10-15 times per day  . ferrous sulfate 325 (65 FE) MG tablet Take 1 tablet (325 mg total) by mouth daily. (Patient not taking: Reported on 03/13/2020)  . loratadine (CLARITIN) 10 MG tablet Take 1 tablet (10 mg total) by mouth daily. (Patient not taking: Reported on 03/13/2020)  . polyethylene glycol (MIRALAX / GLYCOLAX) 17 g packet Take 17 g by mouth daily as needed for mild constipation. (Patient not taking: Reported on 03/13/2020)  . Respiratory Therapy Supplies (FLUTTER) DEVI 1 each by Does not apply route daily.  . simvastatin (ZOCOR) 20 MG tablet Take 1 tablet (20 mg total) by mouth at bedtime. (Patient not taking: Reported on 03/26/2020)  . triamcinolone cream (KENALOG) 0.1 % Apply 1 application topically 2 (two) times daily as needed (rash). (Patient not taking: Reported on 03/13/2020)  . Vitamin D, Ergocalciferol, (DRISDOL) 1.25 MG (50000 UT) CAPS capsule Take 1 capsule (50,000 Units total) by mouth every 7 (seven) days. (Patient not taking: Reported on 03/13/2020)   No facility-administered encounter medications on file as of 03/13/2020.     Current  Diagnosis/Assessment:  Goals Addressed            This Visit's Progress   . Pharmacy Care Plan       CARE PLAN ENTRY  Current Barriers:  . Chronic Disease Management support, education, and care coordination needs related to Hyperlipidemia, Heart Failure, COPD, and back pain   Hyperlipidemia . Pharmacist Clinical Goal(s): o Over the next 90 days, patient will work with PharmD and providers to maintain LDL goal < 70 . Current regimen:  o Simvastatin 20mg , 1 tablet at bedtime  . Interventions: o Recommended nighttime administration for simvastatin . Patient self care activities - Over the next 90 days, patient will: o Continue current medications.  o Schedule visit with Dr. [name] and obtain cholesterol levels.  Heart failure . Pharmacist Clinical Goal(s) o Over the next 90 days, patient will work with PharmD and providers to monitor fluid retention.  . Current regimen:  o Furosemide 20mg , 1 tablet once daily as needed for fluid or edema . Interventions: o We discussed weighing daily; if you gain more than 3 pounds in one day or 5 pounds in one week call your doctor . Patient self care activities - Over the next 90 days, patient will: o Continue current medications  and monitor fluid retention.  COPD . Pharmacist Clinical Goal(s) o Over the next 90 days, patient will work with PharmD and providers to Prevent worsening of shortness of breath and hospitalizations. . Current regimen:   Albuterol 0.083% nebulizer solution, use 1 vial in nebulizer six times daily  Albuterol (Ventolin HFA) 158mcg/ act inhaler, inhale 2 puffs every six hours as needed for wheezing   Brovana 43mcg/2 mL nebulizer solution, use 1 vial in nebulizer twice daily  Budesonide 0.5mg / 2 ml, use 1 vial twice daily . Patient self care activities o Patient will continue current medications.   Back pain . Pharmacist Clinical Goal(s) o Over the next 90 days, patient will work with PharmD and providers to  minimize pain level.  . Current regimen:   Gabapentin 100mg , 1 capsule twice daily   Hydrocodone/ APAP 5/325mg , 0.5 tablet every twelve hours as needed for severe pain   Tizanidine 2mg , 0.5 to 1 tablet at bedtime as needed for muscle spasms   Acetaminophen 500mg , 2 tablets every eight hours as needed for mild pain, moderate pain or headache . Interventions: o We discussed: maximum daily amount of APAP (3000mg / day)  . Patient self care activities o Patient will continue current medications.   Medication management . Pharmacist Clinical Goal(s): o Over the next 90 days, patient will work with PharmD and providers to maintain optimal medication adherence . [facility] . Interventions o Comprehensive medication review performed. o Continue current medication management strategy o For medication assistance, apply to [place] by:  - Call: [phone] - Website: [email or web address] . Patient self care activities - Over the next 90 days, patient will: o Take medications as prescribed o Report any questions or concerns to PharmD and/or provider(s)  Initial goal documentation       SDOH Interventions     Most Recent Value  SDOH Interventions  Financial Strain Interventions  Other (Comment) [Sending Extra Help information to apply.]  Transportation Interventions  Intervention Not Indicated       COPD   Daughter reports breathing has been stable.   Last spirometry score:  07/26/2018 FVC: 84% FEV1: 56% FEV1/FVC: 68%   Gold Grade: Gold 2 (FEV1 50-79%)  Eosinophil count:   Lab Results  Component Value Date/Time   EOSPCT 3 10/05/2019 06:30 AM  %                               Eos (Absolute):  Lab Results  Component Value Date/Time   EOSABS 0.1 10/05/2019 06:30 AM    Tobacco Status:  Social History   Tobacco Use  Smoking Status Former Smoker  . Packs/day: 2.00  . Years: 30.00  . Pack years: 60.00  . Types: Cigarettes  . Quit date:  10/28/1991  . Years since quitting: 28.4  Smokeless Tobacco Never Used    Patient has failed these meds in past: Advair   Patient is currently controlled on the following medications:   Albuterol 0.083% nebulizer solution, use 1 vial in nebulizer six times daily  Albuterol (Ventolin HFA) 121mcg/ act inhaler, inhale 2 puffs every six hours as needed for wheezing   Brovana 24mcg/2 mL nebulizer solution, use 1 vial in nebulizer twice daily  Budesonide 0.5mg / 2 ml, use 1 vial twice daily (after Brovana)   Using maintenance  regularly? Yes  - using twice daily.   Frequency of rescue inhaler use:   - albuterol nebs- will use it in the  morning (2x/ day)  - albuterol rescue inhaler- last use was last night.   We discussed:  proper inhaler technique  Plan Continue current medications   Heart Failure  Daughter reports watching fluid.   Type: Diastolic  Last ejection fraction: 60-65% (05/11/2019)   Patient is currently controlled on the following medications:   Furosemide 20mg , 1 tablet once daily as needed for fluid or edema   We discussed weighing daily; if you gain more than 3 pounds in one day or 5 pounds in one week call your doctor  Plan Continue current medications  Hyperlipidemia   Lipid Panel     Component Value Date/Time   CHOL 125 05/27/2017 1052   TRIG 52.0 05/27/2017 1052   HDL 54.70 05/27/2017 1052   CHOLHDL 2 05/27/2017 1052   VLDL 10.4 05/27/2017 1052   LDLCALC 60 05/27/2017 1052   LDLDIRECT 150.0 06/12/2008 0931     The ASCVD Risk score (Goff DC Jr., et al., 2013) failed to calculate for the following reasons:   The 2013 ASCVD risk score is only valid for ages 66 to 1   Patient has failed these meds in past: none   Patient is currently controlled on the following medications:   Simvastatin 20mg , 1 tablet at bedtime   We discussed:  - recommended nighttime administration of simvastatin    Plan Recommended lipid panel.  Continue current  medications  Aortic athersclerosis    Patient is currently controlled on the following medications:   Aspirin 81mg , 1 tablet once daily   Plan Continue current medications   Depression/ anxiety    Patient is currently controlled on the following medications:   Escitalopram (Lexapro) 20mg , 1 tablet once daily  Alprazolam 0.5mg , 1 tablet twice daily as needed for anxiety (taking every night; and additional dose in the day if needed)  Plan Continue current medications  Allergic rhinitis    Patient is currently controlled on the following medications:   Fluticasone (Flonase) 94mcg/act nasal spray, 2 sprays into both nostrils daily   Plan Continue current medications  Back pain   Daughter reports patient taking 0.5 tablet BID and pain has gotten a lot better. Right now taking gabapentin in the AM.   Patient is currently controlled on the following medications:   Gabapentin 100mg , 1 capsule twice daily   Hydrocodone/ APAP 5/325mg , 0.5 tablet every twelve hours as needed for severe pain   Tizanidine 2mg , 0.5 to 1 tablet at bedtime as needed for muscle spasms   Acetaminophen 500mg , 2 tablets every eight hours as needed for mild pain, moderate pain or headache  We discussed:  - maximum daily amount of APAP (3000mg / day)   Plan Continue current medications  Bone health   Last DEXA Scan: not found in chart   Vit D, 25-Hydroxy  Date Value Ref Range Status  09/19/2019 11.22 (L) 30 - 100 ng/mL Final    Comment:    (NOTE) Vitamin D deficiency has been defined by the [place]  and an Endocrine Society practice guideline as a level of serum 25-OH  vitamin D less than 20 ng/mL (1,2). The Endocrine Society went on to  further define vitamin D insufficiency as a level between 21 and 29  ng/mL (2). 1. IOM (Institute of Medicine). 2010. Dietary reference intakes for  calcium and D. [place]: The [employer]. 2. Holick MF, Binkley Gordon,  Bischoff-Ferrari HA, et al. Evaluation,  treatment, and prevention of vitamin D deficiency: an Endocrine  Society  clinical practice guideline, JCEM. 2011 Jul; 96(7): 1911-30. Performed at [facility], [place] [address] [place], [place] [address]      Patient is not a candidate for pharmacologic treatment   Patient was taking high dose of vitamin D2 50,000 units once weekly. Now taking vitamin in OTC supplement.    Calcium/ vitamin D 500mg /200 unit, 2 tablets daily with breakfast   Plan Continue current medications   Constipation   Patient is currently controlled on the following medications:   Miralax,17 gram daily as needed for mild constipation (currently not giving to her)   Senna- docusate (Senokot) 8.6-50mg , 2 tablets twice daily as needed for mild constipation or moderate constipation   Plan Continue current medications  Medication Management  Patient organizes medications: daughter uses pill box  [facility]  Adherence:  - no recent fill for simvastatin (daughter reports patient was in hospital and nursing home and mostly why she has extra supply)   Follow up Follow up visit with PharmD in  6 months.  Anson Crofts, PharmD Clinical Pharmacist [facility] [phone]

## 2020-03-14 ENCOUNTER — Other Ambulatory Visit: Payer: Self-pay | Admitting: *Deleted

## 2020-03-14 DIAGNOSIS — J449 Chronic obstructive pulmonary disease, unspecified: Secondary | ICD-10-CM

## 2020-03-14 DIAGNOSIS — I5032 Chronic diastolic (congestive) heart failure: Secondary | ICD-10-CM

## 2020-03-14 NOTE — Telephone Encounter (Signed)
Referral placed as requested.

## 2020-03-14 NOTE — Telephone Encounter (Signed)
Message Routed to PCP for review and approval. 

## 2020-03-16 ENCOUNTER — Other Ambulatory Visit: Payer: Self-pay | Admitting: Family Medicine

## 2020-03-16 DIAGNOSIS — M5442 Lumbago with sciatica, left side: Secondary | ICD-10-CM

## 2020-03-16 MED ORDER — HYDROCODONE-ACETAMINOPHEN 5-325 MG PO TABS: 0.5000 | ORAL_TABLET | Freq: Two times a day (BID) | ORAL | 0 refills | Status: DC | PRN

## 2020-03-16 NOTE — Telephone Encounter (Signed)
Rx sent. Thanks, BJ 

## 2020-03-19 NOTE — Telephone Encounter (Signed)
Noted  

## 2020-03-23 ENCOUNTER — Other Ambulatory Visit: Payer: Self-pay | Admitting: Family Medicine

## 2020-03-23 DIAGNOSIS — M5442 Lumbago with sciatica, left side: Secondary | ICD-10-CM

## 2020-03-26 NOTE — Patient Instructions (Addendum)
Visit Information  Goals Addressed            This Visit's Progress   . Pharmacy Care Plan       CARE PLAN ENTRY  Current Barriers:  . Chronic Disease Management support, education, and care coordination needs related to Hyperlipidemia, Heart Failure, COPD, and back pain   Hyperlipidemia . Pharmacist Clinical Goal(s): o Over the next 90 days, patient will work with PharmD and providers to maintain LDL goal < 70 . Current regimen:  o Simvastatin 20mg , 1 tablet at bedtime  . Interventions: o Recommended nighttime administration for simvastatin . Patient self care activities - Over the next 90 days, patient will: o Continue current medications.  o Schedule visit with Dr. [name] and obtain cholesterol levels.  Heart failure . Pharmacist Clinical Goal(s) o Over the next 90 days, patient will work with PharmD and providers to monitor fluid retention.  . Current regimen:  o Furosemide 20mg , 1 tablet once daily as needed for fluid or edema . Interventions: o We discussed weighing daily; if you gain more than 3 pounds in one day or 5 pounds in one week call your doctor . Patient self care activities - Over the next 90 days, patient will: o Continue current medications and monitor fluid retention.  COPD . Pharmacist Clinical Goal(s) o Over the next 90 days, patient will work with PharmD and providers to Prevent worsening of shortness of breath and hospitalizations. . Current regimen:   Albuterol 0.083% nebulizer solution, use 1 vial in nebulizer six times daily  Albuterol (Ventolin HFA) 122mcg/ act inhaler, inhale 2 puffs every six hours as needed for wheezing   Brovana 61mcg/2 mL nebulizer solution, use 1 vial in nebulizer twice daily  Budesonide 0.5mg / 2 ml, use 1 vial twice daily . Patient self care activities o Patient will continue current medications.   Back pain . Pharmacist Clinical Goal(s) o Over the next 90 days, patient will work with PharmD and providers to  minimize pain level.  . Current regimen:   Gabapentin 100mg , 1 capsule twice daily   Hydrocodone/ APAP 5/325mg , 0.5 tablet every twelve hours as needed for severe pain   Tizanidine 2mg , 0.5 to 1 tablet at bedtime as needed for muscle spasms   Acetaminophen 500mg , 2 tablets every eight hours as needed for mild pain, moderate pain or headache . Interventions: o We discussed: maximum daily amount of APAP (3000mg / day)  . Patient self care activities o Patient will continue current medications.   Medication management . Pharmacist Clinical Goal(s): o Over the next 90 days, patient will work with PharmD and providers to maintain optimal medication adherence . Current pharmacy: [facility] . Interventions o Comprehensive medication review performed. o Continue current medication management strategy o For medication assistance, apply to [facility] by:  - Call: [phone] - Website: [email or web address] . Patient self care activities - Over the next 90 days, patient will: o Take medications as prescribed o Report any questions or concerns to PharmD and/or provider(s)  Initial goal documentation        Ms. Mcgeachy was given information about Chronic Care Management services today including:  1. CCM service includes personalized support from designated clinical staff supervised by her physician, including individualized plan of care and coordination with other care providers 2. 24/7 contact phone numbers for assistance for urgent and routine care needs. 3. Standard insurance, coinsurance, copays and deductibles apply for chronic care management only during months in which we provide at least 20 minutes of  these services. Most insurances cover these services at 100%, however patients may be responsible for any copay, coinsurance and/or deductible if applicable. This service may help you avoid the need for more expensive face-to-face services. 4. Only one practitioner may  furnish and bill the service in a calendar month. 5. The patient may stop CCM services at any time (effective at the end of the month) by phone call to the office staff.  Patient agreed to services and verbal consent obtained.   The patient verbalized understanding of instructions provided today and agreed to receive a mailed copy of patient instruction and/or educational materials. Telephone follow up appointment with pharmacy team member scheduled for: 07/10/2020  Anson Crofts, PharmD Clinical Pharmacist [facility] [place] [phone]   Heart Failure, Self Care Heart failure is a serious condition. This sheet explains things you need to do to take care of yourself at home. To help you stay as healthy as possible, you may be asked to change your diet, take certain medicines, and make other changes in your life. Your doctor may also give you more specific instructions. If you have problems or questions, call your doctor. What are the risks? Having heart failure makes it more likely for you to have some problems. These problems can get worse if you do not take good care of yourself. Problems may include:  Blood clotting problems. This may cause a stroke.  Damage to the kidneys, liver, or lungs.  Abnormal heart rhythms. Supplies needed:  Scale for weighing yourself.  Blood pressure monitor.  Notebook.  Medicines. How to care for yourself when you have heart failure Medicines Take over-the-counter and prescription medicines only as told by your doctor. Take your medicines every day.  Do not stop taking your medicine unless your doctor tells you to do so.  Do not skip any medicines.  Get your prescriptions refilled before you run out of medicine. This is important. Eating and drinking   Eat heart-healthy foods. Talk with a diet specialist (dietitian) to create an eating plan.  Choose foods that: ? Have no trans fat. ? Are low in saturated fat and  cholesterol.  Choose healthy foods, such as: ? Fresh or frozen fruits and vegetables. ? Fish. ? Low-fat (lean) meats. ? Legumes, such as beans, peas, and lentils. ? Fat-free or low-fat dairy products. ? Whole-grain foods. ? High-fiber foods.  Limit salt (sodium) if told by your doctor. Ask your diet specialist to tell you which seasonings are healthy for your heart.  Cook in healthy ways instead of frying. Healthy ways of cooking include roasting, grilling, broiling, baking, poaching, steaming, and stir-frying.  Limit how much fluid you drink, if told by your doctor. Alcohol use  Do not drink alcohol if: ? Your doctor tells you not to drink. ? Your heart was damaged by alcohol, or you have very bad heart failure. ? You are pregnant, may be pregnant, or are planning to become pregnant.  If you drink alcohol: ? Limit how much you use to:  0-1 drink a day for women.  0-2 drinks a day for men. ? Be aware of how much alcohol is in your drink. In the [place] one drink equals one 12 oz bottle of beer (355 mL), one 5 oz glass of wine (148 mL), or one 1 oz glass of hard liquor (44 mL). Lifestyle   Do not use any products that contain nicotine or tobacco, such as cigarettes, e-cigarettes, and chewing tobacco. If you  need help quitting, ask your doctor. ? Do not use nicotine gum or patches before talking to your doctor.  Do not use illegal drugs.  Lose weight if told by your doctor.  Do physical activity if told by your doctor. Talk to your doctor before you begin an exercise if: ? You are an older adult. ? You have very bad heart failure.  Learn to manage stress. If you need help, ask your doctor.  Get rehab (rehabilitation) to help you stay independent and to help with your quality of life.  Plan time to rest when you get tired. Check weight and blood pressure   Weigh yourself every day. This will help you to know if fluid is building up in your body. ? Weigh yourself every  morning after you pee (urinate) and before you eat breakfast. ? Wear the same amount of clothing each time. ? Write down your daily weight. Give your record to your doctor.  Check and write down your blood pressure as told by your doctor.  Check your pulse as told by your doctor. Dealing with very hot and very cold weather  If it is very hot: ? Avoid activities that take a lot of energy. ? Use air conditioning or fans, or find a cooler place. ? Avoid caffeine and alcohol. ? Wear clothing that is loose-fitting, lightweight, and light-colored.  If it is very cold: ? Avoid activities that take a lot of energy. ? Layer your clothes. ? Wear mittens or gloves, a hat, and a scarf when you go outside. ? Avoid alcohol. Follow these instructions at home:  Stay up to date with shots (vaccines). Get pneumococcal and flu (influenza) shots.  Keep all follow-up visits as told by your doctor. This is important. Contact a doctor if:  You gain weight quickly.  You have increasing shortness of breath.  You cannot do your normal activities.  You get tired easily.  You cough a lot.  You don't feel like eating or feel like you may vomit (nauseous).  You become puffy (swell) in your hands, feet, ankles, or belly (abdomen).  You cannot sleep well because it is hard to breathe.  You feel like your heart is beating fast (palpitations).  You get dizzy when you stand up. Get help right away if:  You have trouble breathing.  You or someone else notices a change in your behavior, such as having trouble staying awake.  You have chest pain or discomfort.  You pass out (faint). These symptoms may be an emergency. Do not wait to see if the symptoms will go away. Get medical help right away. Call your local emergency services (911 in the [place] Do not drive yourself to the hospital. Summary  Heart failure is a serious condition. To care for yourself, you may have to change your diet, take  medicines, and make other lifestyle changes.  Take your medicines every day. Do not stop taking them unless your doctor tells you to do so.  Eat heart-healthy foods, such as fresh or frozen fruits and vegetables, fish, lean meats, legumes, fat-free or low-fat dairy products, and whole-grain or high-fiber foods.  Ask your doctor if you can drink alcohol. You may have to stop alcohol use if you have very bad heart failure.  Contact your doctor if you gain weight quickly or feel that your heart is beating too fast. Get help right away if you pass out, or have chest pain or trouble breathing. This information is not intended  to replace advice given to you by your health care provider. Make sure you discuss any questions you have with your health care provider. Document Revised: 01/25/2019 Document Reviewed: 01/26/2019 Elsevier Patient Education  [place].

## 2020-04-09 NOTE — Progress Notes (Signed)
[facility] [place]                                       Cardiology Office Note    Date:  04/11/2020   ID:  Jessica [name], DOB 1926/06/18, MRN [id number]  PCP:  [name], [name], MD  Cardiologist: [name], MD / Dr. [name] & Dr. Cyndia Bent (TAVR)  CC: 1 year s/p TAVR  History of Present Illness:  Jessica [name] is a 86 y.o. female with a history of COPD on home 02, CAD s/p DES to LAD (06/2018), HLD, DVT/PE on Eliquis, severe contrast dye allergy, and severe AS s/p TAVR (03/29/19) who presents for follow up.   The patient has knownsevere aortic stenosis who was worked up for consideration of TAVR in September 2019 after presenting with shortness of breath. Her cardiac catheterization on 07/07/2018 showed a heavily calcified 80% proximal LAD stenosis and she underwent atherectomy and stenting on 07/23/2018. Pre TAVRCT scan showed an incidental finding of an acute right lung pulmonary embolism and lower extremity Doppler showed right leg DVT. She was started on Eliquis and continued on Plavix for 6 months before considering TAVR. She had a follow-up echocardiogram on 03/17/2019 which showed an increase in the mean gradient across aortic valve to 53 mmHg with a peak gradient of 73 mmHg. Aortic valve area was 0.58 cm. Left ventricular ejection fraction was 60 to 65%.  She underwent successful TAVR with a 26 mm Edwards Sapien 3 THV via the TF approach on 03/29/19. Post operative echoshowed EF 50%, akinesis of basal inf wall, normally funcitoning TAVR with mean gradient 9.5 mm Hg and no PVL. She had worsening anemia down to 7.5. Eliqius was discontinued and she was started on a baby aspirin.   Today she presents to clinic for follow up. Here with [name] her daughter. Still has significant shortness of breath mostly due to ger COPD. [place] and broke her hip last November and has slowed down ever since. Does not do a lot. No CP. No LE edema, orthopnea  or PND. No dizziness or syncope. No blood in stool or urine. No palpitations.     Past Medical History:  Diagnosis Date  . Anxiety   . congenital nystagmus   . COPD (chronic obstructive pulmonary disease) ([place])   . Coronary artery disease   . DJD (degenerative joint disease)   . Fibromyalgia   . Low back pain syndrome   . Memory loss   . Other and unspecified hyperlipidemia   . Pancreatic lesion   . Pneumonia   . S/P TAVR (transcatheter aortic valve replacement)    26 mm Edwards Sapien 3 via the TF approach  . Severe aortic stenosis   . Venous insufficiency     Past Surgical History:  Procedure Laterality Date  . ABDOMINAL HYSTERECTOMY    . ANTERIOR CERVICAL DISCECTOMY    . APPENDECTOMY    . CARPAL TUNNEL RELEASE  12/2011   right arm  . CATARACT EXTRACTION    . CORONARY ATHERECTOMY  07/23/2018   PTCA/orbital atherectomy/DES x 1 proximal to mid LAD  . CORONARY ATHERECTOMY N/A 07/23/2018   Procedure: CORONARY ATHERECTOMY;  Surgeon: Burnell Blanks, MD;  Location: [facility];  Service: Cardiovascular;  Laterality: N/A;  . CORONARY STENT INTERVENTION    . CORONARY STENT INTERVENTION N/A 07/23/2018   Procedure:  CORONARY STENT INTERVENTION;  Surgeon: Burnell Blanks, MD;  Location: [facility];  Service: Cardiovascular;  Laterality: N/A;  . EYE SURGERY    . INTRAMEDULLARY (IM) NAIL INTERTROCHANTERIC Right 09/19/2019   Procedure: INTRAMEDULLARY (IM) NAIL INTERTROCHANTRIC;  Surgeon: Renette Butters, MD;  Location: [facility];  Service: Orthopedics;  Laterality: Right;  . LUMBAR LAMINECTOMY    . right knee arthroscopy    . right shoulder replacement  04/2009  . right shoulder surgery  2008   Dr. Noemi Chapel  . RIGHT/LEFT HEART CATH AND CORONARY ANGIOGRAPHY N/A 07/07/2018   Procedure: RIGHT/LEFT HEART CATH AND CORONARY ANGIOGRAPHY;  Surgeon: Burnell Blanks, MD;  Location: [facility];  Service: Cardiovascular;  Laterality: N/A;  . TEE WITHOUT  CARDIOVERSION N/A 03/29/2019   Procedure: TRANSESOPHAGEAL ECHOCARDIOGRAM (TEE);  Surgeon: Burnell Blanks, MD;  Location: [facility];  Service: Open Heart Surgery;  Laterality: N/A;  . TRANSCATHETER AORTIC VALVE REPLACEMENT, TRANSFEMORAL N/A 03/29/2019   Procedure: TRANSCATHETER AORTIC VALVE REPLACEMENT, TRANSFEMORAL;  Surgeon: Burnell Blanks, MD;  Location: [facility];  Service: Open Heart Surgery;  Laterality: N/A;  . ULTRASOUND GUIDANCE FOR VASCULAR ACCESS  07/07/2018   Procedure: Ultrasound Guidance For Vascular Access;  Surgeon: Burnell Blanks, MD;  Location: [facility];  Service: Cardiovascular;;    Current Medications: Outpatient Medications Prior to Visit  Medication Sig Dispense Refill  . acetaminophen (TYLENOL) 500 MG tablet Take 2 tablets (1,000 mg total) by mouth every 8 (eight) hours as needed for mild pain, moderate pain or headache (pain). 30 tablet 0  . albuterol (PROVENTIL) (2.5 MG/3ML) 0.083% nebulizer solution USE 1 VIAL IN NEBULIZER 6 TIMES DAILY - (for rescue) (Max 30 doses per month) 75 mL 11  . albuterol (VENTOLIN HFA) 108 (90 Base) MCG/ACT inhaler TAKE 2 PUFFS BY MOUTH EVERY 6 HOURS AS NEEDED FOR WHEEZE 18 g 0  . ALPRAZolam (XANAX) 0.5 MG tablet Take 1 tablet (0.5 mg total) by mouth 2 (two) times daily as needed for anxiety. 60 tablet 1  . AMBULATORY NON FORMULARY MEDICATION Medication Name: Incentive spirometer Use 10-15 times per day 1 each 0  . aspirin EC 81 MG tablet Take 1 tablet (81 mg total) by mouth daily.    Marland Kitchen BROVANA 15 MCG/2ML NEBU USE 1 VIAL  IN  NEBULIZER TWICE  DAILY - morning and evening 210 mL 11  . budesonide (PULMICORT) 0.5 MG/2ML nebulizer solution USE 1 VIAL  IN  NEBULIZER TWICE  DAILY - Rinse mouth after treatment 210 mL 11  . calcium-vitamin D (OSCAL WITH D) 500-200 MG-UNIT tablet Take 2 tablets by mouth daily with breakfast. 180 tablet 0  . escitalopram (LEXAPRO) 20 MG tablet Take 1 tablet (20 mg total) by mouth daily. 90 tablet 1   . fluticasone (FLONASE) 50 MCG/ACT nasal spray Place 2 sprays into both nostrils daily. 16 g 0  . furosemide (LASIX) 20 MG tablet Take 1 tablet (20 mg total) by mouth daily as needed for fluid or edema (sob or weight gain). 90 tablet 3  . gabapentin (NEURONTIN) 100 MG capsule Take 1 capsule (100 mg total) by mouth 2 (two) times daily. 60 capsule 3  . HYDROcodone-acetaminophen (NORCO) 5-325 MG tablet Take 0.5 tablets by mouth every 12 (twelve) hours as needed for severe pain. 20 tablet 0  . loratadine (CLARITIN) 10 MG tablet Take 1 tablet (10 mg total) by mouth daily. 10 tablet 0  . polyethylene glycol (MIRALAX / GLYCOLAX) 17 g packet Take  17 g by mouth daily as needed for mild constipation. 14 each 0  . Respiratory Therapy Supplies (FLUTTER) DEVI 1 each by Does not apply route daily. 1 each 0  . senna-docusate (SENOKOT-S) 8.6-50 MG tablet Take 2 tablets by mouth 2 (two) times daily as needed for mild constipation or moderate constipation. 60 tablet 0  . simvastatin (ZOCOR) 20 MG tablet Take 1 tablet (20 mg total) by mouth at bedtime. 90 tablet 3  . tiZANidine (ZANAFLEX) 2 MG tablet TAKE 0.5-1 TABLETS (1-2 MG TOTAL) BY MOUTH AT BEDTIME AS NEEDED FOR MUSCLE SPASMS. 30 tablet 0  . vitamin B-12 (CYANOCOBALAMIN) 1000 MCG tablet Take 1,000 mcg by mouth daily.    . ferrous sulfate 325 (65 FE) MG tablet Take 1 tablet (325 mg total) by mouth daily. (Patient not taking: Reported on 03/13/2020) 90 tablet 3  . triamcinolone cream (KENALOG) 0.1 % Apply 1 application topically 2 (two) times daily as needed (rash). (Patient not taking: Reported on 03/13/2020) 28.4 g 1  . Vitamin D, Ergocalciferol, (DRISDOL) 1.25 MG (50000 UT) CAPS capsule Take 1 capsule (50,000 Units total) by mouth every 7 (seven) days. (Patient not taking: Reported on 03/13/2020) 7 capsule 0   No facility-administered medications prior to visit.     Allergies:   Contrast media [iodinated diagnostic agents] and Iohexol   Social History    Socioeconomic History  . Marital status: Widowed    Spouse name: Not on file  . Number of children: 2  . Years of education: Not on file  . Highest education level: Not on file  Occupational History  . 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 Smoking status: Former Smoker    Packs/day: 2.00    Years: 30.00    Pack years: 60.00    Types: Cigarettes    Quit date: 10/28/1991    Years since quitting: 28.4  . Smokeless tobacco: Never Used  Vaping Use  . Vaping Use: Never used  Substance and Sexual Activity  . Alcohol use: Yes    Comment: 1 drink per night  . Drug use: No  . Sexual activity: Not Currently  Other Topics Concern  . Not on file  Social History Narrative   1 sibling alive age 4   1 sibling alive age 60   1 sibling deceased age 67   1 sibling alive age 69   Social Determinants of Health   Financial Resource Strain: Medium Risk  . Difficulty of Paying Living Expenses: Somewhat hard  Food Insecurity:   . Worried About Charity fundraiser in the Last Year:   . Arboriculturist in the Last Year:   Transportation Needs: No Transportation Needs  . Lack of Transportation (Medical): No  . Lack of Transportation (Non-Medical): No  Physical Activity:   . Days of Exercise per Week:   . Minutes of Exercise per Session:   Stress:   . Feeling of Stress :   Social Connections:   . Frequency of Communication with Friends and Family:   . Frequency of Social Gatherings with Friends and Family:   . Attends Religious Services:   . Active Member of Clubs or Organizations:   . Attends Archivist Meetings:   Marland Kitchen Marital Status:      Family History:  The patient's family history includes Other in her father and mother.       ROS:   Please see the history of present illness.    ROS  All other systems reviewed and are negative.   PHYSICAL EXAM:   VS:  BP 108/60   Pulse (!) 52   Ht 5\' 4"  (1.626 m)   Wt 132 lb (59.9 kg)   SpO2 96%   BMI 22.66  kg/m    GEN:  Elderly and frail appearing HEENT: normal Neck: no JVD or masses Cardiac: RRR; no murmurs, rubs, or gallops,no edema  Respiratory:  clear to auscultation bilaterally, normal work of breathing GI: soft, nontender, nondistended, + BS MS: no deformity or atrophy Skin: warm and dry, no rash Neuro:  Alert and Oriented x 3, Strength and sensation are intact Psych: euthymic mood, full affect   Wt Readings from Last 3 Encounters:  04/11/20 132 lb (59.9 kg)  02/15/20 125 lb 8 oz (56.9 kg)  10/05/19 120 lb (54.4 kg)      Studies/Labs Reviewed:   EKG:  EKG is NOT ordered today.    Recent Labs: 09/22/2019: ALT 8; Magnesium 2.0 10/05/2019: B Natriuretic Peptide 105.5; BUN 13; Creatinine, Ser 0.70; Hemoglobin 12.6; Platelets 359; Potassium 3.4; Sodium 141   Lipid Panel    Component Value Date/Time   CHOL 125 05/27/2017 1052   TRIG 52.0 05/27/2017 1052   HDL 54.70 05/27/2017 1052   CHOLHDL 2 05/27/2017 1052   VLDL 10.4 05/27/2017 1052   LDLCALC 60 05/27/2017 1052   LDLDIRECT 150.0 06/12/2008 0931    Additional studies/ records that were reviewed today include:  TAVR OPERATIVE NOTE   Date of Procedure:03/29/2019  Preoperative Diagnosis:Severe Aortic Stenosis   Postoperative Diagnosis:Same   Procedure:   Transcatheter Aortic Valve Replacement - PercutaneousRightTransfemoral Approach Edwards Sapien 3 THV (size 69mm, model # 9600TFX, serial # [id number])  Co-Surgeons:Bryan Alveria Apley, MD and Lauree Chandler, MD   Anesthesiologist:Kevin Conrad Hannah, MD  Echocardiographer:Mihai Croitoru, MD  Pre-operative Echo Findings: ? Severe aortic stenosis ? Normalleft ventricular systolic function  Post-operative Echo Findings: ? Noparavalvular leak ? Normalleft ventricular systolic function   _______________   Echo 03/30/2019: IMPRESSIONS 1. The  left ventricle has low normal systolic function, with an ejection fraction of 50-55%. The cavity size was normal. There is moderately increased left ventricular wall thickness. Left ventricular diastolic function could not be evaluated due to  nondiagnostic images. 2. There is akinesis of the basal inferior wall. 3. The right ventricle has normal systolic function. The cavity was normal. There is no increase in right ventricular wall thickness. Right ventricular systolic pressure is moderately elevated with an estimated pressure of 39.6 mmHg. 4. The mitral valve is degenerative. Mild thickening of the anterior mitral valve leaflet. Moderate calcification of the anterior mitral valve leaflet. There is moderate mitral annular calcification present. MV Mean grad: 4.0 mmHg, 5. - TAVR: S/P 41mm Edwards Sapien TAVR which is functioning normally with no perivalvular AI. The mean AVG is 9.73mmHg, Dimensionless index 0.53, AVA (VTI) 1.67cm2 and AV max 208 cm/s. 6. The interatrial septum appears to be lipomatous. 7. The inferior vena cava was dilated in size with <50% respiratory variability. 8. Moderate pleural effusion in the left lateral region.   _______________  Echo 05/11/19:  IMPRESSIONS  1. The left ventricle has normal systolic function with an ejection fraction of 60-65%. The cavity size was normal. Left ventricular diastolic Doppler parameters are consistent with impaired relaxation.  2. The right ventricle has normal systolic function. The cavity was normal. There is no increase in right ventricular wall thickness.  3. Moderate thickening of the mitral valve leaflet. Moderate calcification of the mitral valve  leaflet.  4. A 26 Edwards Sapien bioprosthetic aortic valve (TAVR) valve is present in the aortic position. Procedure Date: 03/29/2019 Normal aortic valve prosthesis.  5. Mild thickening of the aortic valve. Mild calcification of the aortic valve. Aortic valve regurgitation was not  assessed by color flow Doppler.  6. TAVR: 2.52m/s peak velocity, mean gradient 26mmHg. AVA 2.01cm squared.   ________________________  Echo 04/11/20 IMPRESSIONS  1. Hyperdynamic LV, EF 70-75%. Intracavitary gradient ~23 mmHG. Left ventricular ejection fraction, by estimation, is 70 to 75%. The left ventricle has hyperdynamic function. The left ventricle has no regional wall motion abnormalities. There is mild  concentric left ventricular hypertrophy. Indeterminate diastolic filling due to E-A fusion.  2. Right ventricular systolic function is normal. The right ventricular size is normal. There is mildly elevated pulmonary artery systolic pressure. The estimated right ventricular systolic pressure is 87.6 mmHg.  3. The mitral valve is degenerative. No evidence of mitral valve regurgitation. No evidence of mitral stenosis.  4. 26 mm S3. Vmax 2.1 m/s, MG 10 mmHG, EOA 1.55 cm2, DI 0.41. Normally functioning TAVR without paravalvular leak. The aortic valve has been repaired/replaced. Aortic valve regurgitation is not visualized. There is a 26 mm Edwards Sapien prosthetic  (TAVR) valve present in the aortic position. Procedure Date: 03/29/2019.  5. The inferior vena cava is normal in size with greater than 50% respiratory variability, suggesting right atrial pressure of 3 mmHg.  Comparison(s): No significant change from prior study.  ASSESSMENT & PLAN:   Severe AS s/p TAVR: has had a big improvement in her shortness of breath since TAVR but still has NYHA class III symptoms due to COPD. Echo today shows hyperdynamic EF 70-75% with intracavitary gradient of 62mm Hg, normally functioning TAVR with a mean gradient of 10 mmHg and no PVL. SBE prophylaxis discussed; the patient is edentulous and does not go to the dentist. Continue on aspirin 81 mg daily. Plan for follow up with Dr. [name] in 6 months.   Of note if the patient requires a future echo, it will need to be done in a 2 hour time slot as the  patient had a lot of body pain with laying flat and the different positions required for imaging as well as frequent coughing fits.   Medication Adjustments/Labs and Tests Ordered: Current medicines are reviewed at length with the patient today.  Concerns regarding medicines are outlined above.  Medication changes, Labs and Tests ordered today are listed in the Patient Instructions below. Patient Instructions  Medication Instructions:  Your provider recommends that you continue on your current medications as directed. Please refer to the Current Medication list given to you today.   *If you need a refill on your cardiac medications before your next appointment, please call your pharmacy*   Follow-Up: At [facility], you and your health needs are our priority.  As part of our continuing mission to provide you with exceptional heart care, we have created designated Provider Care Teams.  These Care Teams include your primary Cardiologist (physician) and Advanced Practice Providers (APPs -  Physician Assistants and Nurse Practitioners) who all work together to provide you with the care you need, when you need it. Your next appointment:   6 month(s) The format for your next appointment:   In Person Provider:    You may see [name], MD or one of the following Advanced Practice Providers on your designated Care Team:    Murray Hodgkins, NP  Christell Faith, PA-C  Marrianne Mood,  PA-C      Signed, [name], PA-C  04/11/2020 8:52 PM    [facility] [address], [place], [place]  [address] Phone: [phone]; Fax: [phone]

## 2020-04-11 ENCOUNTER — Encounter: Payer: Self-pay | Admitting: Physician Assistant

## 2020-04-11 ENCOUNTER — Ambulatory Visit (INDEPENDENT_AMBULATORY_CARE_PROVIDER_SITE_OTHER): Payer: Medicare Other | Admitting: Physician Assistant

## 2020-04-11 ENCOUNTER — Ambulatory Visit (HOSPITAL_COMMUNITY): Payer: Medicare Other | Attending: Cardiovascular Disease

## 2020-04-11 ENCOUNTER — Other Ambulatory Visit: Payer: Self-pay

## 2020-04-11 VITALS — BP 108/60 | HR 52 | Ht 64.0 in | Wt 132.0 lb

## 2020-04-11 DIAGNOSIS — Z86711 Personal history of pulmonary embolism: Secondary | ICD-10-CM | POA: Diagnosis not present

## 2020-04-11 DIAGNOSIS — D649 Anemia, unspecified: Secondary | ICD-10-CM | POA: Diagnosis not present

## 2020-04-11 DIAGNOSIS — J449 Chronic obstructive pulmonary disease, unspecified: Secondary | ICD-10-CM | POA: Diagnosis not present

## 2020-04-11 DIAGNOSIS — Z952 Presence of prosthetic heart valve: Secondary | ICD-10-CM

## 2020-04-11 DIAGNOSIS — I5032 Chronic diastolic (congestive) heart failure: Secondary | ICD-10-CM | POA: Diagnosis not present

## 2020-04-11 NOTE — Patient Instructions (Signed)
Medication Instructions:  Your provider recommends that you continue on your current medications as directed. Please refer to the Current Medication list given to you today.   *If you need a refill on your cardiac medications before your next appointment, please call your pharmacy*   Follow-Up: At [facility], you and your health needs are our priority.  As part of our continuing mission to provide you with exceptional heart care, we have created designated Provider Care Teams.  These Care Teams include your primary Cardiologist (physician) and Advanced Practice Providers (APPs -  Physician Assistants and Nurse Practitioners) who all work together to provide you with the care you need, when you need it. Your next appointment:   6 month(s) The format for your next appointment:   In Person Provider:    You may see [name], MD or one of the following Advanced Practice Providers on your designated Care Team:    Murray Hodgkins, NP  Christell Faith, PA-C  Marrianne Mood, PA-C

## 2020-04-13 ENCOUNTER — Other Ambulatory Visit: Payer: Self-pay | Admitting: Physician Assistant

## 2020-04-15 ENCOUNTER — Other Ambulatory Visit: Payer: Self-pay | Admitting: Family Medicine

## 2020-04-15 DIAGNOSIS — M5441 Lumbago with sciatica, right side: Secondary | ICD-10-CM

## 2020-04-21 ENCOUNTER — Other Ambulatory Visit: Payer: Self-pay | Admitting: Internal Medicine

## 2020-04-21 DIAGNOSIS — J449 Chronic obstructive pulmonary disease, unspecified: Secondary | ICD-10-CM

## 2020-04-24 ENCOUNTER — Other Ambulatory Visit: Payer: Self-pay

## 2020-04-24 DIAGNOSIS — J449 Chronic obstructive pulmonary disease, unspecified: Secondary | ICD-10-CM

## 2020-04-24 MED ORDER — ALBUTEROL SULFATE HFA 108 (90 BASE) MCG/ACT IN AERS: INHALATION_SPRAY | RESPIRATORY_TRACT | 1 refills | Status: DC

## 2020-04-24 NOTE — Telephone Encounter (Signed)
Pt's daughter, [name] [facility]) is requesting Rx for Ventolin.  Pt is past due for an appt, and is aware that an appt is needed prior to refills.  Pt has pending OV for 06/2020 with Dr. Mortimer Fries.  One month supply of ventolin with 1 refill has been sent to preferred pharmacy to get her by until her f/u. Nothing further is needed.

## 2020-04-26 ENCOUNTER — Other Ambulatory Visit: Payer: Self-pay | Admitting: Family Medicine

## 2020-04-26 DIAGNOSIS — F411 Generalized anxiety disorder: Secondary | ICD-10-CM

## 2020-04-26 DIAGNOSIS — G4700 Insomnia, unspecified: Secondary | ICD-10-CM

## 2020-05-18 ENCOUNTER — Other Ambulatory Visit: Payer: Self-pay | Admitting: Family Medicine

## 2020-05-18 DIAGNOSIS — M5442 Lumbago with sciatica, left side: Secondary | ICD-10-CM

## 2020-05-18 DIAGNOSIS — M5441 Lumbago with sciatica, right side: Secondary | ICD-10-CM

## 2020-06-15 ENCOUNTER — Other Ambulatory Visit: Payer: Self-pay | Admitting: Family Medicine

## 2020-06-15 ENCOUNTER — Other Ambulatory Visit: Payer: Self-pay | Admitting: Internal Medicine

## 2020-06-15 DIAGNOSIS — J449 Chronic obstructive pulmonary disease, unspecified: Secondary | ICD-10-CM

## 2020-06-15 DIAGNOSIS — M5441 Lumbago with sciatica, right side: Secondary | ICD-10-CM

## 2020-06-15 NOTE — Telephone Encounter (Signed)
Pt daughter called to say mother needs a refill on HYDROcodone-acetaminophen ([address]) 5-325 MG tablet   [facility], [place]  [name] [name] [address]  Phone:  [phone] Fax:  [phone]   Please call her when ordered  [phone]  Please advise

## 2020-06-15 NOTE — Telephone Encounter (Signed)
I do not mind to continue medication for pain management but she needs to follow as recommended. Given her age and the fact that she is taking Xanax + severe COPD, the risk for side effects is high. Thanks, BJ

## 2020-06-18 ENCOUNTER — Telehealth: Payer: Self-pay

## 2020-06-18 NOTE — Telephone Encounter (Signed)
Patient needs a f/u appt with Dr. [name] before she can refill her medication. I blocked the 3pm for tomorrow for her if they're able to come at that time.

## 2020-06-18 NOTE — Telephone Encounter (Signed)
Spoke to pt daughter [name] (on [name]) and she was ok with the 3pm tomorrow but will have to check with work. I have the pt scheduled

## 2020-06-18 NOTE — Telephone Encounter (Signed)
Noted, thanks!

## 2020-06-19 ENCOUNTER — Ambulatory Visit (INDEPENDENT_AMBULATORY_CARE_PROVIDER_SITE_OTHER): Payer: Medicare Other | Admitting: Family Medicine

## 2020-06-19 ENCOUNTER — Other Ambulatory Visit: Payer: Self-pay | Admitting: Family Medicine

## 2020-06-19 ENCOUNTER — Other Ambulatory Visit: Payer: Self-pay

## 2020-06-19 ENCOUNTER — Encounter: Payer: Self-pay | Admitting: Family Medicine

## 2020-06-19 VITALS — BP 100/60 | HR 61 | Temp 97.6°F | Resp 20 | Ht 64.0 in

## 2020-06-19 DIAGNOSIS — J449 Chronic obstructive pulmonary disease, unspecified: Secondary | ICD-10-CM

## 2020-06-19 DIAGNOSIS — F411 Generalized anxiety disorder: Secondary | ICD-10-CM | POA: Diagnosis not present

## 2020-06-19 DIAGNOSIS — G4700 Insomnia, unspecified: Secondary | ICD-10-CM

## 2020-06-19 DIAGNOSIS — M5442 Lumbago with sciatica, left side: Secondary | ICD-10-CM

## 2020-06-19 DIAGNOSIS — J9610 Chronic respiratory failure, unspecified whether with hypoxia or hypercapnia: Secondary | ICD-10-CM | POA: Diagnosis not present

## 2020-06-19 DIAGNOSIS — M5441 Lumbago with sciatica, right side: Secondary | ICD-10-CM

## 2020-06-19 DIAGNOSIS — E876 Hypokalemia: Secondary | ICD-10-CM | POA: Diagnosis not present

## 2020-06-19 MED ORDER — HYDROCODONE-ACETAMINOPHEN 5-325 MG PO TABS: 0.5000 | ORAL_TABLET | Freq: Two times a day (BID) | ORAL | 0 refills | Status: DC | PRN

## 2020-06-19 NOTE — Progress Notes (Signed)
HPI: [name] is a 86 y.o. female, who is here today with her daughter follow up.  Hearing loss, her daughter helps with interrogation.  She was last seen on 02/15/20.  Since her last visit she has followed with cardiologist.  Chronic pain: Fibromyalgia and back pain with radiculopathy. Pain radiated to LE, bilateral. Pain is severe.  Epidural injections did not help. In 12/2019 we started low dose Hydrocodone-Acetaminophen 5-325 mg 1/2 tab bid. Medication is helping with pain, she takes once daily in avarage, not daily. Gabapentin 100 mg bid.  She has tolerated medications well. He daughter wonders if a muscle relaxant may also help.  She uses a walker and a wheel chair at home. No falls since her last visit. Her daughter is with her most of the time.  Anxiety: She is on Alprazolam 0.5 mg bid and Lexapro 20 mg. Alprazolam helps her sleep. Sleeps 10-11 hours.  Negative for depression.  Severe COPD, no changes in cough or dyspnea. Intermittent wheezing. Productive cough with clear sputum. She is on Pulmicort neb bid, and Brovana 15 mcg neb bid , and Albuterol neb 6 times per day if needed.  Follows with pulmonologist.  Mild hypoK+. She takes Furosemide 20 mg daily. S/P TAVR. Echo 04/11/20: LVEF 70-75%.  Lab Results  Component Value Date   CREATININE 0.70 10/05/2019   BUN 13 10/05/2019   NA 141 10/05/2019   K 3.4 (L) 10/05/2019   CL 102 10/05/2019   CO2 32 09/22/2019    Review of Systems  Constitutional: Positive for fatigue. Negative for activity change, appetite change and fever.  HENT: Negative for mouth sores, nosebleeds and sore throat.   Gastrointestinal: Negative for abdominal pain, nausea and vomiting.       Negative for changes in bowel habits.  Genitourinary: Negative for decreased urine volume, dysuria and hematuria.  Musculoskeletal: Positive for arthralgias and gait problem.  Neurological: Negative for syncope and headaches.    Psychiatric/Behavioral: Negative for hallucinations. The patient is nervous/anxious.   Rest of ROS, see pertinent positives sand negatives in HPI  Current Outpatient Medications on File Prior to Visit  Medication Sig Dispense Refill  . acetaminophen (TYLENOL) 500 MG tablet Take 2 tablets (1,000 mg total) by mouth every 8 (eight) hours as needed for mild pain, moderate pain or headache (pain). 30 tablet 0  . albuterol (PROVENTIL) (2.5 MG/3ML) 0.083% nebulizer solution USE 1 VIAL IN NEBULIZER 6 TIMES DAILY - (for rescue) (Max 30 doses per month) 75 mL 11  . albuterol (VENTOLIN HFA) 108 (90 Base) MCG/ACT inhaler INHALE 2 PUFFS BY MOUTH EVERY 6 HOURS AS NEEDED FOR WHEEZE 18 g 0  . ALPRAZolam (XANAX) 0.5 MG tablet TAKE 1 TABLET (0.5 MG TOTAL) BY MOUTH 2 (TWO) TIMES DAILY AS NEEDED FOR ANXIETY. 60 tablet 2  . AMBULATORY NON FORMULARY MEDICATION Medication Name: Incentive spirometer Use 10-15 times per day 1 each 0  . aspirin EC 81 MG tablet Take 1 tablet (81 mg total) by mouth daily.    Marland Kitchen BROVANA 15 MCG/2ML NEBU USE 1 VIAL  IN  NEBULIZER TWICE  DAILY - morning and evening 210 mL 11  . budesonide (PULMICORT) 0.5 MG/2ML nebulizer solution USE 1 VIAL  IN  NEBULIZER TWICE  DAILY - Rinse mouth after treatment 210 mL 11  . calcium-vitamin D (OSCAL WITH D) 500-200 MG-UNIT tablet Take 2 tablets by mouth daily with breakfast. 180 tablet 0  . escitalopram (LEXAPRO) 20 MG tablet Take 1 tablet (20 mg  total) by mouth daily. 90 tablet 1  . fluticasone (FLONASE) 50 MCG/ACT nasal spray Place 2 sprays into both nostrils daily. 16 g 0  . furosemide (LASIX) 20 MG tablet TAKE 1 TABLET BY MOUTH EVERY DAY 90 tablet 3  . loratadine (CLARITIN) 10 MG tablet Take 1 tablet (10 mg total) by mouth daily. 10 tablet 0  . polyethylene glycol (MIRALAX / GLYCOLAX) 17 g packet Take 17 g by mouth daily as needed for mild constipation. 14 each 0  . Respiratory Therapy Supplies (FLUTTER) DEVI 1 each by Does not apply route daily. 1 each  0  . senna-docusate (SENOKOT-S) 8.6-50 MG tablet Take 2 tablets by mouth 2 (two) times daily as needed for mild constipation or moderate constipation. 60 tablet 0  . simvastatin (ZOCOR) 20 MG tablet Take 1 tablet (20 mg total) by mouth at bedtime. 90 tablet 3  . vitamin B-12 (CYANOCOBALAMIN) 1000 MCG tablet Take 1,000 mcg by mouth daily.     No current facility-administered medications on file prior to visit.   Past Medical History:  Diagnosis Date  . Anxiety   . congenital nystagmus   . COPD (chronic obstructive pulmonary disease) ([place])   . Coronary artery disease   . DJD (degenerative joint disease)   . Fibromyalgia   . Low back pain syndrome   . Memory loss   . Other and unspecified hyperlipidemia   . Pancreatic lesion   . Pneumonia   . S/P TAVR (transcatheter aortic valve replacement)    26 mm Edwards Sapien 3 via the TF approach  . Severe aortic stenosis   . Venous insufficiency    Allergies  Allergen Reactions  . Contrast Media [Iodinated Diagnostic Agents] Itching, Rash and Other (See Comments)    Hypotension, Skin turns red like a sunburn  . Iohexol Itching, Rash and Other (See Comments)    Hypotension, Skin turns red like a sunburn    Social History   Socioeconomic History  . Marital status: Widowed    Spouse name: Not on file  . Number of children: 2  . Years of education: Not on file  . Highest education level: Not on file  Occupational History  . 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 Smoking status: Former Smoker    Packs/day: 2.00    Years: 30.00    Pack years: 60.00    Types: Cigarettes    Quit date: 10/28/1991    Years since quitting: 28.6  . Smokeless tobacco: Never Used  Vaping Use  . Vaping Use: Never used  Substance and Sexual Activity  . Alcohol use: Yes    Comment: 1 drink per night  . Drug use: No  . Sexual activity: Not Currently  Other Topics Concern  . Not on file  Social History Narrative   1 sibling alive age  51   1 sibling alive age 2   1 sibling deceased age 72   1 sibling alive age 46   Social Determinants of Health   Financial Resource Strain: Medium Risk  . Difficulty of Paying Living Expenses: Somewhat hard  Food Insecurity:   . Worried About Charity fundraiser in the Last Year: Not on file  . Ran Out of Food in the Last Year: Not on file  Transportation Needs: No Transportation Needs  . Lack of Transportation (Medical): No  . Lack of Transportation (Non-Medical): No  Physical Activity:   . Days of Exercise per Week: Not on file  .  Minutes of Exercise per Session: Not on file  Stress:   . Feeling of Stress : Not on file  Social Connections:   . Frequency of Communication with Friends and Family: Not on file  . Frequency of Social Gatherings with Friends and Family: Not on file  . Attends Religious Services: Not on file  . Active Member of Clubs or Organizations: Not on file  . Attends Archivist Meetings: Not on file  . Marital Status: Not on file   Vitals:   06/19/20 1457  BP: 100/60  Pulse: 61  Resp: 20  Temp: 97.6 F (36.4 C)  SpO2: 90%   Body mass index is 22.66 kg/m.  Physical Exam Vitals and nursing note reviewed.  Constitutional:      General: She is not in acute distress.    Appearance: She is well-developed.  HENT:     Head: Normocephalic and atraumatic.  Eyes:     Conjunctiva/sclera: Conjunctivae normal.     Pupils: Pupils are equal, round, and reactive to light.  Cardiovascular:     Rate and Rhythm: Regular rhythm. Bradycardia present.     Heart sounds: No murmur heard.   Pulmonary:     Effort: Pulmonary effort is normal. No respiratory distress.     Breath sounds: Normal breath sounds.  Abdominal:     Palpations: Abdomen is soft.     Tenderness: There is no abdominal tenderness.  Lymphadenopathy:     Cervical: No cervical adenopathy.  Skin:    General: Skin is warm.     Findings: No erythema or rash.  Neurological:     Mental  Status: She is alert.     Comments: She is not oriented in time. In her wheel chair.  Psychiatric:        Mood and Affect: Affect normal. Mood is anxious.     Comments: Well groomed, good eye contact.   ASSESSMENT AND PLAN:   Ms. Jaiyah Beining [facility] was seen today for follow-up.  Orders Placed This Encounter  Procedures  . BASIC METABOLIC PANEL WITH GFR   Lab Results  Component Value Date   CREATININE 0.71 06/19/2020   BUN 14 06/19/2020   NA 142 06/19/2020   K 3.8 06/19/2020   CL 103 06/19/2020   CO2 32 06/19/2020    Chronic respiratory failure, unspecified whether with hypoxia or hypercapnia (HCC) Stable overall. Following with pulmonologist.  Bilateral low back pain with bilateral sciatica, unspecified chronicity We discussed side effects of opioid medications and risk of worsening respiratory problems when taken with benzo. She is tolerating medication well, her daughter manages medication and monitoring for side effects. Continue Gabapentin 100 mg bid. Zanaflex 2-4 mg added to take at bedtime as needed. Fall precautions. Med contract signed in 01/2020. PDMD revised.  -     HYDROcodone-acetaminophen (NORCO) 5-325 MG tablet; Take 0.5 tablets by mouth every 12 (twelve) hours as needed for severe pain. -     gabapentin (NEURONTIN) 100 MG capsule; Take 1 capsule (100 mg total) by mouth 2 (two) times daily.  Hypokalemia K+ rich diet recommended for now. Further recommendations according to BMP results.  Generalized anxiety disorder Stable. Continue Lexapro 20 mg and Alprazolam 0.5 mg bid prn.  Insomnia, unspecified type Well controlled. Good sleep hygiene. Continue Alprazolam 0.5 mg at bedtime.  Chronic obstructive pulmonary disease, unspecified COPD type (HCC) Severe. Problem is not well controlled but otherwise stable. Follows with pulmonologist.  Return in about 4 months (around 10/24/2020).  Jazz Rogala G. [name], MD  [facility]. [place]  office.  A few things to remember from today's visit:  No changes today. Caution with falls. I will see you back in 4-5 months.  If you need refills please call your pharmacy. Do not use My Chart to request refills or for acute issues that need immediate attention.    Please be sure medication list is accurate. If a new problem present, please set up appointment sooner than planned today.

## 2020-06-19 NOTE — Patient Instructions (Signed)
A few things to remember from today's visit:  No changes today. Caution with falls. I will see you back in 4-5 months.  If you need refills please call your pharmacy. Do not use My Chart to request refills or for acute issues that need immediate attention.    Please be sure medication list is accurate. If a new problem present, please set up appointment sooner than planned today.

## 2020-06-20 LAB — BASIC METABOLIC PANEL WITH GFR
BUN: 14 mg/dL (ref 7–25)
CO2: 32 mmol/L (ref 20–32)
Calcium: 10.1 mg/dL (ref 8.6–10.4)
Chloride: 103 mmol/L (ref 98–110)
Creat: 0.71 mg/dL (ref 0.60–0.88)
GFR, Est African American: 86 mL/min/{1.73_m2} (ref >=60)
GFR, Est Non African American: 74 mL/min/{1.73_m2} (ref >=60)
Glucose, Bld: 99 mg/dL (ref 65–99)
Potassium: 3.8 mmol/L (ref 3.5–5.3)
Sodium: 142 mmol/L (ref 135–146)

## 2020-06-23 MED ORDER — GABAPENTIN 100 MG PO CAPS: 100.0000 mg | ORAL_CAPSULE | Freq: Two times a day (BID) | ORAL | 3 refills | Status: DC

## 2020-07-10 ENCOUNTER — Telehealth: Payer: Medicare Other

## 2020-07-15 ENCOUNTER — Other Ambulatory Visit: Payer: Self-pay | Admitting: Family Medicine

## 2020-07-15 DIAGNOSIS — M5442 Lumbago with sciatica, left side: Secondary | ICD-10-CM

## 2020-07-24 ENCOUNTER — Ambulatory Visit: Payer: Medicare Other | Admitting: Internal Medicine

## 2020-08-18 ENCOUNTER — Other Ambulatory Visit: Payer: Self-pay | Admitting: Family Medicine

## 2020-08-18 DIAGNOSIS — M5442 Lumbago with sciatica, left side: Secondary | ICD-10-CM

## 2020-08-18 DIAGNOSIS — M5441 Lumbago with sciatica, right side: Secondary | ICD-10-CM

## 2020-08-30 ENCOUNTER — Other Ambulatory Visit: Payer: Self-pay | Admitting: Internal Medicine

## 2020-08-30 DIAGNOSIS — J449 Chronic obstructive pulmonary disease, unspecified: Secondary | ICD-10-CM

## 2020-08-30 DIAGNOSIS — J42 Unspecified chronic bronchitis: Secondary | ICD-10-CM

## 2020-08-31 ENCOUNTER — Other Ambulatory Visit: Payer: Self-pay

## 2020-08-31 ENCOUNTER — Ambulatory Visit (INDEPENDENT_AMBULATORY_CARE_PROVIDER_SITE_OTHER): Payer: Medicare Other | Admitting: Internal Medicine

## 2020-08-31 ENCOUNTER — Encounter: Payer: Self-pay | Admitting: Internal Medicine

## 2020-08-31 VITALS — BP 114/60 | HR 83 | Temp 97.1°F | Ht 64.0 in | Wt 113.2 lb

## 2020-08-31 DIAGNOSIS — J449 Chronic obstructive pulmonary disease, unspecified: Secondary | ICD-10-CM | POA: Diagnosis not present

## 2020-08-31 DIAGNOSIS — Z23 Encounter for immunization: Secondary | ICD-10-CM

## 2020-08-31 DIAGNOSIS — J9611 Chronic respiratory failure with hypoxia: Secondary | ICD-10-CM

## 2020-08-31 MED ORDER — PREDNISONE 10 MG PO TABS: ORAL_TABLET | ORAL | 1 refills | Status: DC

## 2020-08-31 NOTE — Progress Notes (Signed)
[facility] Consultation     Date: 08/31/2020,   MRN# [id number] Jessica [name] November 18, 1925  Former [place] Patient  PFT's 01/2015 Ratio 54%, Fev1 65%, DLCO 64% TLC 101%, RV 119% Interpretation: moderate COPD with BD response with hyperinflation and air trapping with diffusion impairment  PFT 03/31/2016 Ratio 52% FEV1 72% DLCO 70% TLC 88% RV 90% Interpretation: mild/moderate obstructive disease with BD response  ONO shows hypoxia-oxygen ordered  CHIEF COMPLAINT:   Follow up COPD   HISTORY OF PRESENT ILLNESS   86 yo white female former smoker, quit 35 years ago, has dx of COPD   COPD seems to be stable at this time progress shortness of breath and dyspnea exertion Related to advanced age Takes nebulizer therapy right now  Chronic oxygen therapy chronic hypoxic respiratory failure Has intermittent wheezing intermittent cough with chest congestion Patient does not use incentive spirometry or flutter valve often  Overall prognosis is poor  No exacerbation at this time No evidence of heart failure at this time No evidence or signs of infection at this time No respiratory distress No fevers, chills, nausea, vomiting, diarrhea No evidence of lower extremity edema No evidence hemoptysis  On oxygen -needs this to survive Current Medication:   Current Outpatient Medications:  .  acetaminophen (TYLENOL) 500 MG tablet, Take 2 tablets (1,000 mg total) by mouth every 8 (eight) hours as needed for mild pain, moderate pain or headache (pain)., Disp: 30 tablet, Rfl: 0 .  albuterol (PROVENTIL) (2.5 MG/3ML) 0.083% nebulizer solution, USE 1 VIAL IN NEBULIZER 6 TIMES DAILY - (for rescue) (Max 30 doses per month), Disp: 75 mL, Rfl: 11 .  albuterol (VENTOLIN HFA) 108 (90 Base) MCG/ACT inhaler, INHALE 2 PUFFS BY MOUTH EVERY 6 HOURS AS NEEDED FOR WHEEZE, Disp: 18 g, Rfl: 0 .  ALPRAZolam (XANAX) 0.5 MG tablet, TAKE 1 TABLET (0.5 MG TOTAL) BY MOUTH 2 (TWO) TIMES  DAILY AS NEEDED FOR ANXIETY., Disp: 60 tablet, Rfl: 2 .  AMBULATORY NON FORMULARY MEDICATION, Medication Name: Incentive spirometer Use 10-15 times per day, Disp: 1 each, Rfl: 0 .  aspirin EC 81 MG tablet, Take 1 tablet (81 mg total) by mouth daily., Disp: , Rfl:  .  BROVANA 15 MCG/2ML NEBU, USE 1 VIAL  IN  NEBULIZER TWICE  DAILY - morning and evening, Disp: 120 mL, Rfl: 1 .  budesonide (PULMICORT) 0.5 MG/2ML nebulizer solution, USE 1 VIAL  IN  NEBULIZER TWICE  DAILY - Rinse mouth after treatment, Disp: 120 mL, Rfl: 1 .  calcium-vitamin D (OSCAL WITH D) 500-200 MG-UNIT tablet, Take 2 tablets by mouth daily with breakfast., Disp: 180 tablet, Rfl: 0 .  escitalopram (LEXAPRO) 20 MG tablet, Take 1 tablet (20 mg total) by mouth daily., Disp: 90 tablet, Rfl: 1 .  fluticasone (FLONASE) 50 MCG/ACT nasal spray, Place 2 sprays into both nostrils daily., Disp: 16 g, Rfl: 0 .  furosemide (LASIX) 20 MG tablet, TAKE 1 TABLET BY MOUTH EVERY DAY, Disp: 90 tablet, Rfl: 3 .  gabapentin (NEURONTIN) 100 MG capsule, Take 1 capsule (100 mg total) by mouth 2 (two) times daily., Disp: 60 capsule, Rfl: 3 .  HYDROcodone-acetaminophen (NORCO) 5-325 MG tablet, Take 0.5 tablets by mouth every 12 (twelve) hours as needed for severe pain., Disp: 20 tablet, Rfl: 0 .  loratadine (CLARITIN) 10 MG tablet, Take 1 tablet (10 mg total) by mouth daily., Disp: 10 tablet, Rfl: 0 .  polyethylene glycol (MIRALAX / GLYCOLAX) 17 g packet, Take 17 g by  mouth daily as needed for mild constipation., Disp: 14 each, Rfl: 0 .  Respiratory Therapy Supplies (FLUTTER) DEVI, 1 each by Does not apply route daily., Disp: 1 each, Rfl: 0 .  senna-docusate (SENOKOT-S) 8.6-50 MG tablet, Take 2 tablets by mouth 2 (two) times daily as needed for mild constipation or moderate constipation., Disp: 60 tablet, Rfl: 0 .  simvastatin (ZOCOR) 20 MG tablet, Take 1 tablet (20 mg total) by mouth at bedtime., Disp: 90 tablet, Rfl: 3 .  tiZANidine (ZANAFLEX) 2 MG tablet,  TAKE 0.5-1 TABLETS (1-2 MG TOTAL) BY MOUTH AT BEDTIME AS NEEDED FOR MUSCLE SPASMS., Disp: 30 tablet, Rfl: 0 .  vitamin B-12 (CYANOCOBALAMIN) 1000 MCG tablet, Take 1,000 mcg by mouth daily., Disp: , Rfl:      ALLERGIES   Contrast media [iodinated diagnostic agents] and Iohexol     REVIEW OF SYSTEMS   Review of Systems  Constitutional: Negative for chills, fever, malaise/fatigue and weight loss.  HENT: Negative for congestion and hearing loss.   Respiratory: Positive for cough and sputum production. Negative for hemoptysis, shortness of breath and wheezing.        Chronic cough and chronic sputum production  Cardiovascular: Negative for chest pain, palpitations, orthopnea and leg swelling.  Gastrointestinal: Negative for heartburn and nausea.  Skin: Negative for rash.  Neurological: Negative for dizziness and headaches.  Psychiatric/Behavioral: Substance abuse: .vs   All other systems reviewed and are negative.  .vs BP 114/60 (BP Location: Left Arm, Patient Position: Sitting, Cuff Size: Normal)   Pulse 83   Temp (!) 97.1 F (36.2 C) (Temporal)   Ht 5\' 4"  (1.626 m)   Wt 113 lb 3.2 oz (51.3 kg)   SpO2 95%   BMI 19.43 kg/m    PHYSICAL EXAM   Physical Exam Constitutional:      General: She is not in acute distress.    Appearance: She is not diaphoretic.  Cardiovascular:     Rate and Rhythm: Normal rate and regular rhythm.     Heart sounds: Murmur heard.   Pulmonary:     Effort: Pulmonary effort is normal. No respiratory distress.     Breath sounds: Normal breath sounds. No stridor. No wheezing or rales.  Abdominal:     Palpations: Abdomen is soft.  Skin:    General: Skin is warm.  Neurological:     Mental Status: She is alert and oriented to person, place, and time.     Cranial Nerves: No cranial nerve deficit.        ASSESSMENT/PLAN   86 yo white female with Mild/Moderate COPD Gold Stage C  With chronic hypoxic resp failure, patient very frail, in the  setting of severe Aortic stenosis  Moderate COPD Gold stage D Progressive shortness of breath dyspnea exertion Switch to neb therapy Continue Pulmicort Brovana nebs Albuterol as needed Prednisone 10 mg daily for 10 days  Chronic bronchitis with mucus production Incentive spirometry and flutter valve as needed  Chronic hypoxic respiratory failure from COPD Continue oxygen as prescribed Uses and benefits from therapy Needs this for survival   Severe aortic stenosis Follow-up cardiology   COVID-19 EDUCATION: The signs and symptoms of COVID-19 were discussed with the patient and how to seek care for testing.  The importance of social distancing was discussed today. Hand Washing Techniques and avoid touching face was advised.     MEDICATION ADJUSTMENTS/LABS AND TESTS ORDERED:  Continue oxygen as prescribed  continue nebulizer as as prescribed   CURRENT MEDICATIONS REVIEWED  AT LENGTH WITH PATIENT TODAY   Patient satisfied with Plan of action and management. All questions answered  Follow up in 6 months  Total time spent 32 minutes  Corrin Parker, M.D.  [facility]  Medical Director [place] Director [facility] Cardio-Pulmonary Department

## 2020-08-31 NOTE — Patient Instructions (Signed)
Continue oxygen as prescribed  continue nebulizer as as prescribed

## 2020-09-03 ENCOUNTER — Telehealth: Payer: Self-pay | Admitting: Internal Medicine

## 2020-09-03 DIAGNOSIS — J42 Unspecified chronic bronchitis: Secondary | ICD-10-CM

## 2020-09-03 DIAGNOSIS — J449 Chronic obstructive pulmonary disease, unspecified: Secondary | ICD-10-CM

## 2020-09-03 NOTE — Telephone Encounter (Signed)
Called and spoke to patient's daughter, Jessica [name](DPR). Jessica [name] is requesting order for new nebulizer machine to be sent to [facility].  Order has been placed based off of last OV note. Nothing further needed.

## 2020-09-06 ENCOUNTER — Other Ambulatory Visit: Payer: Self-pay | Admitting: Family Medicine

## 2020-09-06 DIAGNOSIS — M5442 Lumbago with sciatica, left side: Secondary | ICD-10-CM

## 2020-09-06 NOTE — Telephone Encounter (Signed)
Patient daughter is calling and requesting a refill for HYDROcodone-acetaminophen ([place]) 5-325 MG tablet sent to [facility] [place], [address] [place] [place] Phone:  [phone] Fax:  [phone] CB is [phone]

## 2020-09-10 MED ORDER — HYDROCODONE-ACETAMINOPHEN 5-325 MG PO TABS: 0.5000 | ORAL_TABLET | Freq: Two times a day (BID) | ORAL | 0 refills | Status: DC | PRN

## 2020-09-20 ENCOUNTER — Other Ambulatory Visit: Payer: Self-pay | Admitting: Family Medicine

## 2020-09-20 DIAGNOSIS — M5442 Lumbago with sciatica, left side: Secondary | ICD-10-CM

## 2020-09-20 DIAGNOSIS — M5441 Lumbago with sciatica, right side: Secondary | ICD-10-CM

## 2020-10-01 ENCOUNTER — Other Ambulatory Visit: Payer: Self-pay | Admitting: Internal Medicine

## 2020-10-01 DIAGNOSIS — J42 Unspecified chronic bronchitis: Secondary | ICD-10-CM

## 2020-10-01 DIAGNOSIS — J449 Chronic obstructive pulmonary disease, unspecified: Secondary | ICD-10-CM

## 2020-10-01 NOTE — Progress Notes (Signed)
Cardiology Office Note  Date:  10/02/2020   ID:  Jessica [name], DOB 12/16/1925, MRN [id number]  PCP:  [name], [name], MD   Chief Complaint  Patient presents with  . office visit    6 month F/U-Patient reports pain in legs and feet; Meds verbally reviewed with patient.    HPI:  Jessica [name]  is an 86 y/o woman with  aortic valve stenosis, TAVR h/o HL,  fibromyalgia,  CAD, aortic athero on CT 2016 anxiety  moderate COPD/emphysema,  asthmatic bronchitis Previous smoking history but stopped over 30 years ago.  total knee replacement July 2013 by her report Mild gait instability, walks with a cane.  Previous history of falls with trauma to her face/forehead requiring stitches. She presents for follow-up of her TAVR  In follow-up today she presents in a wheelchair with family Weight drop 127 lb  2 years ago Today 110 pounds today Missing meals, appetite low Losing muscles Balance poor, on a walker.  Takes pain pill for leg pain, has not started gabapentin Lasix PRN  Macular degeneration, having shots into her eyes Used to work in furniture shop until past several years  Chronic cough/bronchitis On oxygen at home Does not use oxygen when going out Very cold hands today, difficulty getting oxygen saturation, running in the low 90  EKG personally reviewed by myself on todays visit Shows normal sinus rhythm rate 86 bpm nonspecific ST abnormality, PVC  Other past medical history reviewed Still working at [employer] , does not want to retire   she continues to work on Production assistant, radio Using a neb treatment, breathing better, spitting up, thick "stuff" Pulmonary, Dr. Mortimer Fries,   significant exposure to secondhand smoke from her daughter   Previous fall Valentine's Day 2017, fractured her right clavicle ( aortic calcification seen on chest x-ray  At that time)  no surgery was performed  difficulty with her memory per the family    said details of her story  over and over on her visit today  Echocardiogram April 2016 showing mild to moderate aortic valve stenosis Carotid ultrasound November 2015 showing mild bilateral carotid disease  Previous trip to the ER with CP in 8/11.  Had post hospital Myoview in 9/11 with normal EF and question of inferior ischemia.   medical therapy pursued at that time. Asymptomatic since then  No recurrent CP or undue dyspnea. Does get fatigued. No palpitations, CHF or syncope.      PMH:   has a past medical history of Anxiety, congenital nystagmus, COPD (chronic obstructive pulmonary disease) ([place]), Coronary artery disease, DJD (degenerative joint disease), Fibromyalgia, Low back pain syndrome, Memory loss, Other and unspecified hyperlipidemia, Pancreatic lesion, Pneumonia, S/P TAVR (transcatheter aortic valve replacement), Severe aortic stenosis, and Venous insufficiency.  PSH:    Past Surgical History:  Procedure Laterality Date  . ABDOMINAL HYSTERECTOMY    . ANTERIOR CERVICAL DISCECTOMY    . APPENDECTOMY    . CARPAL TUNNEL RELEASE  12/2011   right arm  . CATARACT EXTRACTION    . CORONARY ATHERECTOMY  07/23/2018   PTCA/orbital atherectomy/DES x 1 proximal to mid LAD  . CORONARY ATHERECTOMY N/A 07/23/2018   Procedure: CORONARY ATHERECTOMY;  Surgeon: Burnell Blanks, MD;  Location: [facility];  Service: Cardiovascular;  Laterality: N/A;  . CORONARY STENT INTERVENTION    . CORONARY STENT INTERVENTION N/A 07/23/2018   Procedure: CORONARY STENT INTERVENTION;  Surgeon: Burnell Blanks, MD;  Location: [facility];  Service: Cardiovascular;  Laterality: N/A;  . EYE SURGERY    . INTRAMEDULLARY (IM) NAIL INTERTROCHANTERIC Right 09/19/2019   Procedure: INTRAMEDULLARY (IM) NAIL INTERTROCHANTRIC;  Surgeon: Renette Butters, MD;  Location: [facility];  Service: Orthopedics;  Laterality: Right;  . LUMBAR LAMINECTOMY    . right knee arthroscopy    . right shoulder replacement  04/2009  . right  shoulder surgery  2008   Dr. Noemi Chapel  . RIGHT/LEFT HEART CATH AND CORONARY ANGIOGRAPHY N/A 07/07/2018   Procedure: RIGHT/LEFT HEART CATH AND CORONARY ANGIOGRAPHY;  Surgeon: Burnell Blanks, MD;  Location: [facility];  Service: Cardiovascular;  Laterality: N/A;  . TEE WITHOUT CARDIOVERSION N/A 03/29/2019   Procedure: TRANSESOPHAGEAL ECHOCARDIOGRAM (TEE);  Surgeon: Burnell Blanks, MD;  Location: [facility];  Service: Open Heart Surgery;  Laterality: N/A;  . TRANSCATHETER AORTIC VALVE REPLACEMENT, TRANSFEMORAL N/A 03/29/2019   Procedure: TRANSCATHETER AORTIC VALVE REPLACEMENT, TRANSFEMORAL;  Surgeon: Burnell Blanks, MD;  Location: [facility];  Service: Open Heart Surgery;  Laterality: N/A;  . ULTRASOUND GUIDANCE FOR VASCULAR ACCESS  07/07/2018   Procedure: Ultrasound Guidance For Vascular Access;  Surgeon: Burnell Blanks, MD;  Location: [facility];  Service: Cardiovascular;;    Current Outpatient Medications  Medication Sig Dispense Refill  . acetaminophen (TYLENOL) 500 MG tablet Take 2 tablets (1,000 mg total) by mouth every 8 (eight) hours as needed for mild pain, moderate pain or headache (pain). 30 tablet 0  . albuterol (PROVENTIL) (2.5 MG/3ML) 0.083% nebulizer solution USE 1 VIAL IN NEBULIZER 6 TIMES DAILY - (for rescue) (Max 30 doses per month) 75 mL 11  . albuterol (VENTOLIN HFA) 108 (90 Base) MCG/ACT inhaler INHALE 2 PUFFS BY MOUTH EVERY 6 HOURS AS NEEDED FOR WHEEZE 18 g 0  . ALPRAZolam (XANAX) 0.5 MG tablet TAKE 1 TABLET (0.5 MG TOTAL) BY MOUTH 2 (TWO) TIMES DAILY AS NEEDED FOR ANXIETY. 60 tablet 2  . AMBULATORY NON FORMULARY MEDICATION Medication Name: Incentive spirometer Use 10-15 times per day 1 each 0  . aspirin EC 81 MG tablet Take 1 tablet (81 mg total) by mouth daily.    Marland Kitchen BROVANA 15 MCG/2ML NEBU USE 1 VIAL  IN  NEBULIZER TWICE  DAILY - morning and evening 120 mL 11  . budesonide (PULMICORT) 0.5 MG/2ML nebulizer solution USE 1 VIAL  IN  NEBULIZER  TWICE  DAILY - Rinse mouth after treatment 120 mL 11  . calcium-vitamin D (OSCAL WITH D) 500-200 MG-UNIT tablet Take 2 tablets by mouth daily with breakfast. 180 tablet 0  . escitalopram (LEXAPRO) 20 MG tablet Take 1 tablet (20 mg total) by mouth daily. 90 tablet 1  . fluticasone (FLONASE) 50 MCG/ACT nasal spray Place 2 sprays into both nostrils daily. 16 g 0  . HYDROcodone-acetaminophen (NORCO) 5-325 MG tablet Take 0.5 tablets by mouth every 12 (twelve) hours as needed for severe pain. Follow up in 09/2020. 20 tablet 0  . loratadine (CLARITIN) 10 MG tablet Take 1 tablet (10 mg total) by mouth daily. 10 tablet 0  . polyethylene glycol (MIRALAX / GLYCOLAX) 17 g packet Take 17 g by mouth daily as needed for mild constipation. 14 each 0  . Respiratory Therapy Supplies (FLUTTER) DEVI 1 each by Does not apply route daily. 1 each 0  . senna-docusate (SENOKOT-S) 8.6-50 MG tablet Take 2 tablets by mouth 2 (two) times daily as needed for mild constipation or moderate constipation. 60 tablet 0  . simvastatin (ZOCOR) 20 MG tablet Take 1  tablet (20 mg total) by mouth at bedtime. 90 tablet 3  . tiZANidine (ZANAFLEX) 2 MG tablet TAKE 0.5-1 TABLETS (1-2 MG TOTAL) BY MOUTH AT BEDTIME AS NEEDED FOR MUSCLE SPASMS. 30 tablet 0  . vitamin B-12 (CYANOCOBALAMIN) 1000 MCG tablet Take 1,000 mcg by mouth daily.     No current facility-administered medications for this visit.     Allergies:   Contrast media [iodinated diagnostic agents] and Iohexol   Social History:  The patient  reports that she quit smoking about 28 years ago. Her smoking use included cigarettes. She has a 60.00 pack-year smoking history. She has never used smokeless tobacco. She reports previous alcohol use. She reports that she does not use drugs.   Family History:   family history includes Other in her father and mother.    Review of Systems: Review of Systems  HENT: Negative.   Respiratory: Positive for cough and shortness of breath.    Cardiovascular: Negative.   Gastrointestinal: Negative.   Musculoskeletal: Negative.        Diffuse leg pain bilaterally  Neurological: Positive for weakness.  Psychiatric/Behavioral: Negative.   All other systems reviewed and are negative.   PHYSICAL EXAM: VS:  BP 104/64 (BP Location: Right Arm, Patient Position: Sitting, Cuff Size: Normal)   Pulse 86   Ht 5\' 4"  (1.626 m)   Wt 110 lb (49.9 kg) Comment: wearing coat  BMI 18.88 kg/m  , BMI Body mass index is 18.88 kg/m. Constitutional:  oriented to person, place, and time. No distress.  Cough HENT:  Head: Grossly normal Eyes:  no discharge. No scleral icterus.  Neck: No JVD, no carotid bruits  Cardiovascular: Regular rate and rhythm, no murmurs appreciated Pulmonary/Chest: Scattered Rales Abdominal: Soft.  no distension.  no tenderness.  Musculoskeletal: Normal range of motion Neurological:  normal muscle tone. Coordination normal. No atrophy Skin: Skin warm and dry Psychiatric: normal affect, pleasant   Recent Labs: 10/05/2019: B Natriuretic Peptide 105.5; Hemoglobin 12.6; Platelets 359 06/19/2020: BUN 14; Creat 0.71; Potassium 3.8; Sodium 142    Lipid Panel Lab Results  Component Value Date   CHOL 125 05/27/2017   HDL 54.70 05/27/2017   LDLCALC 60 05/27/2017   TRIG 52.0 05/27/2017      Wt Readings from Last 3 Encounters:  10/02/20 110 lb (49.9 kg)  08/31/20 113 lb 3.2 oz (51.3 kg)  04/11/20 132 lb (59.9 kg)     ASSESSMENT AND PLAN:   Atherosclerosis of native coronary artery of native heart with angina pectoris with documented spasm ([place]) - Plan: EKG 12-Lead Currently with no symptoms of angina. No further workup at this time. Continue current medication regimen. Very sedentary  Mixed hyperlipidemia Cholesterol is at goal on the current lipid regimen. No changes to the medications were made.  Nonrheumatic aortic valve stenosis Severe stenosis, TAVR No acute symptoms, prior echocardiogram June  2021  Memory loss/dementia Hard of hearing, losing tremendous weight Family doing more for support  Chronic cough Continues to have chronic bronchitis, spitting into a cup Recommend family watch her saturations at home  COPD exacerbation (HCC) Cough seems particularly thick, no production of sputum in clinic but has lots of spitting up at home Seems to be chronic issue Recommend she use her nebulizers twice daily   Total encounter time more than 25 minutes  Greater than 50% was spent in counseling and coordination of care with the patient     Signed, Esmond Plants, M.D., Ph.D. 10/02/2020  [place],  [place] [phone]

## 2020-10-02 ENCOUNTER — Encounter: Payer: Self-pay | Admitting: Cardiovascular Disease

## 2020-10-02 ENCOUNTER — Ambulatory Visit (INDEPENDENT_AMBULATORY_CARE_PROVIDER_SITE_OTHER): Payer: Medicare Other | Admitting: Cardiovascular Disease

## 2020-10-02 ENCOUNTER — Other Ambulatory Visit: Payer: Self-pay

## 2020-10-02 VITALS — BP 104/64 | HR 86 | Ht 64.0 in | Wt 110.0 lb

## 2020-10-02 DIAGNOSIS — I2699 Other pulmonary embolism without acute cor pulmonale: Secondary | ICD-10-CM

## 2020-10-02 DIAGNOSIS — I350 Nonrheumatic aortic (valve) stenosis: Secondary | ICD-10-CM

## 2020-10-02 DIAGNOSIS — I5032 Chronic diastolic (congestive) heart failure: Secondary | ICD-10-CM

## 2020-10-02 DIAGNOSIS — Z86711 Personal history of pulmonary embolism: Secondary | ICD-10-CM

## 2020-10-02 DIAGNOSIS — D649 Anemia, unspecified: Secondary | ICD-10-CM

## 2020-10-02 DIAGNOSIS — J449 Chronic obstructive pulmonary disease, unspecified: Secondary | ICD-10-CM

## 2020-10-02 DIAGNOSIS — Z952 Presence of prosthetic heart valve: Secondary | ICD-10-CM

## 2020-10-02 DIAGNOSIS — I2510 Atherosclerotic heart disease of native coronary artery without angina pectoris: Secondary | ICD-10-CM | POA: Diagnosis not present

## 2020-10-02 NOTE — Patient Instructions (Signed)
Medication Instructions:  No changes  If you need a refill on your cardiac medications before your next appointment, please call your pharmacy.    Lab work: No new labs needed   If you have labs (blood work) drawn today and your tests are completely normal, you will receive your results only by: . MyChart Message (if you have MyChart) OR . A paper copy in the mail If you have any lab test that is abnormal or we need to change your treatment, we will call you to review the results.   Testing/Procedures: No new testing needed   Follow-Up: At [facility], you and your health needs are our priority.  As part of our continuing mission to provide you with exceptional heart care, we have created designated Provider Care Teams.  These Care Teams include your primary Cardiologist (physician) and Advanced Practice Providers (APPs -  Physician Assistants and Nurse Practitioners) who all work together to provide you with the care you need, when you need it.  . You will need a follow up appointment in 6 months  . Providers on your designated Care Team:   . Christopher Berge, NP . Ryan Dunn, PA-C . Jacquelyn Visser, PA-C  Any Other Special Instructions Will Be Listed Below (If Applicable).  COVID-19 Vaccine Information can be found at: [email or web address] For questions related to vaccine distribution or appointments, please email [email or web address] or call [phone]     

## 2020-10-03 ENCOUNTER — Ambulatory Visit: Payer: Medicare Other

## 2020-10-03 NOTE — Progress Notes (Deleted)
Virtual Visit via Telephone Note  I connected with  Jessica [name] on 10/03/20 at  1:45 PM EST by telephone and verified that I am speaking with the correct person using two identifiers.  Medicare Annual Wellness visit completed telephonically due to Covid-19 pandemic.   Persons participating in this call: This Health Coach and this patient.   Location: Patient: Home Provider: Office   I discussed the limitations, risks, security and privacy concerns of performing an evaluation and management service by telephone and the availability of in person appointments. The patient expressed understanding and agreed to proceed.  Unable to perform video visit due to video visit attempted and failed and/or patient does not have video capability.   Some vital signs may be absent or patient reported.   Jessica Brace, LPN    Subjective:   Jessica [name] is a 86 y.o. female who presents for an Initial Medicare Annual Wellness Visit.  Review of Systems           Objective:    There were no vitals filed for this visit. There is no height or weight on file to calculate BMI.  Advanced Directives 10/05/2019 09/19/2019 09/17/2019 09/17/2019 03/25/2019 12/29/2018 08/24/2018  Does Patient Have a Medical Advance Directive? Yes Yes Yes Yes Yes Yes Yes  Type of Advance Directive Healthcare Power of [place] of [place];Living will [place];Living will  Does patient want to make changes to medical advance directive? No - Patient declined No - Patient declined No - Patient declined Yes (ED - Information included in AVS) No - Patient declined No - Patient declined No - Patient declined  Copy of [place] in Chart? - - - - Yes - validated most recent copy scanned in chart (See row information) No - copy requested No - copy requested    Current Medications  (verified) Outpatient Encounter Medications as of 10/03/2020  Medication Sig  . acetaminophen (TYLENOL) 500 MG tablet Take 2 tablets (1,000 mg total) by mouth every 8 (eight) hours as needed for mild pain, moderate pain or headache (pain).  Marland Kitchen albuterol (PROVENTIL) (2.5 MG/3ML) 0.083% nebulizer solution USE 1 VIAL IN NEBULIZER 6 TIMES DAILY - (for rescue) (Max 30 doses per month)  . albuterol (VENTOLIN HFA) 108 (90 Base) MCG/ACT inhaler INHALE 2 PUFFS BY MOUTH EVERY 6 HOURS AS NEEDED FOR WHEEZE  . ALPRAZolam (XANAX) 0.5 MG tablet TAKE 1 TABLET (0.5 MG TOTAL) BY MOUTH 2 (TWO) TIMES DAILY AS NEEDED FOR ANXIETY.  Marland Kitchen AMBULATORY NON FORMULARY MEDICATION Medication Name: Incentive spirometer Use 10-15 times per day  . aspirin EC 81 MG tablet Take 1 tablet (81 mg total) by mouth daily.  Marland Kitchen BROVANA 15 MCG/2ML NEBU USE 1 VIAL  IN  NEBULIZER TWICE  DAILY - morning and evening  . budesonide (PULMICORT) 0.5 MG/2ML nebulizer solution USE 1 VIAL  IN  NEBULIZER TWICE  DAILY - Rinse mouth after treatment  . calcium-vitamin D (OSCAL WITH D) 500-200 MG-UNIT tablet Take 2 tablets by mouth daily with breakfast.  . escitalopram (LEXAPRO) 20 MG tablet Take 1 tablet (20 mg total) by mouth daily.  . fluticasone (FLONASE) 50 MCG/ACT nasal spray Place 2 sprays into both nostrils daily.  Marland Kitchen HYDROcodone-acetaminophen (NORCO) 5-325 MG tablet Take 0.5 tablets by mouth every 12 (twelve) hours as needed for severe pain. Follow up in 09/2020.  Marland Kitchen loratadine (CLARITIN) 10 MG tablet Take 1  tablet (10 mg total) by mouth daily.  . polyethylene glycol (MIRALAX / GLYCOLAX) 17 g packet Take 17 g by mouth daily as needed for mild constipation.  Marland Kitchen Respiratory Therapy Supplies (FLUTTER) DEVI 1 each by Does not apply route daily.  Marland Kitchen senna-docusate (SENOKOT-S) 8.6-50 MG tablet Take 2 tablets by mouth 2 (two) times daily as needed for mild constipation or moderate constipation.  . simvastatin (ZOCOR) 20 MG tablet Take 1 tablet (20 mg total) by  mouth at bedtime.  Marland Kitchen tiZANidine (ZANAFLEX) 2 MG tablet TAKE 0.5-1 TABLETS (1-2 MG TOTAL) BY MOUTH AT BEDTIME AS NEEDED FOR MUSCLE SPASMS.  . vitamin B-12 (CYANOCOBALAMIN) 1000 MCG tablet Take 1,000 mcg by mouth daily.   No facility-administered encounter medications on file as of 10/03/2020.    Allergies (verified) Contrast media [iodinated diagnostic agents] and Iohexol   History: Past Medical History:  Diagnosis Date  . Anxiety   . congenital nystagmus   . COPD (chronic obstructive pulmonary disease) ([place])   . Coronary artery disease   . DJD (degenerative joint disease)   . Fibromyalgia   . Low back pain syndrome   . Memory loss   . Other and unspecified hyperlipidemia   . Pancreatic lesion   . Pneumonia   . S/P TAVR (transcatheter aortic valve replacement)    26 mm Edwards Sapien 3 via the TF approach  . Severe aortic stenosis   . Venous insufficiency    Past Surgical History:  Procedure Laterality Date  . ABDOMINAL HYSTERECTOMY    . ANTERIOR CERVICAL DISCECTOMY    . APPENDECTOMY    . CARPAL TUNNEL RELEASE  12/2011   right arm  . CATARACT EXTRACTION    . CORONARY ATHERECTOMY  07/23/2018   PTCA/orbital atherectomy/DES x 1 proximal to mid LAD  . CORONARY ATHERECTOMY N/A 07/23/2018   Procedure: CORONARY ATHERECTOMY;  Surgeon: Burnell Blanks, MD;  Location: [facility];  Service: Cardiovascular;  Laterality: N/A;  . CORONARY STENT INTERVENTION    . CORONARY STENT INTERVENTION N/A 07/23/2018   Procedure: CORONARY STENT INTERVENTION;  Surgeon: Burnell Blanks, MD;  Location: [facility];  Service: Cardiovascular;  Laterality: N/A;  . EYE SURGERY    . INTRAMEDULLARY (IM) NAIL INTERTROCHANTERIC Right 09/19/2019   Procedure: INTRAMEDULLARY (IM) NAIL INTERTROCHANTRIC;  Surgeon: Renette Butters, MD;  Location: [facility];  Service: Orthopedics;  Laterality: Right;  . LUMBAR LAMINECTOMY    . right knee arthroscopy    . right shoulder replacement  04/2009   . right shoulder surgery  2008   Dr. Noemi Chapel  . RIGHT/LEFT HEART CATH AND CORONARY ANGIOGRAPHY N/A 07/07/2018   Procedure: RIGHT/LEFT HEART CATH AND CORONARY ANGIOGRAPHY;  Surgeon: Burnell Blanks, MD;  Location: [facility];  Service: Cardiovascular;  Laterality: N/A;  . TEE WITHOUT CARDIOVERSION N/A 03/29/2019   Procedure: TRANSESOPHAGEAL ECHOCARDIOGRAM (TEE);  Surgeon: Burnell Blanks, MD;  Location: [facility];  Service: Open Heart Surgery;  Laterality: N/A;  . TRANSCATHETER AORTIC VALVE REPLACEMENT, TRANSFEMORAL N/A 03/29/2019   Procedure: TRANSCATHETER AORTIC VALVE REPLACEMENT, TRANSFEMORAL;  Surgeon: Burnell Blanks, MD;  Location: [facility];  Service: Open Heart Surgery;  Laterality: N/A;  . ULTRASOUND GUIDANCE FOR VASCULAR ACCESS  07/07/2018   Procedure: Ultrasound Guidance For Vascular Access;  Surgeon: Burnell Blanks, MD;  Location: [facility];  Service: Cardiovascular;;   Family History  Problem Relation Age of Onset  . Other Mother        broken  hip  . Other Father        kidney problems  . Colon cancer Neg Hx   . Stomach cancer Neg Hx   . Rectal cancer Neg Hx   . Pancreatic cancer Neg Hx    Social History   Socioeconomic History  . Marital status: Widowed    Spouse name: Not on file  . Number of children: 2  . Years of education: Not on file  . Highest education level: Not on file  Occupational History  . 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 Smoking status: Former Smoker    Packs/day: 2.00    Years: 30.00    Pack years: 60.00    Types: Cigarettes    Quit date: 10/28/1991    Years since quitting: 28.9  . Smokeless tobacco: Never Used  Vaping Use  . Vaping Use: Never used  Substance and Sexual Activity  . Alcohol use: Not Currently    Comment: 1 drink per night  . Drug use: No  . Sexual activity: Not Currently  Other Topics Concern  . Not on file  Social History Narrative   9 siblings   Social  Determinants of Health   Financial Resource Strain: Medium Risk  . Difficulty of Paying Living Expenses: Somewhat hard  Food Insecurity:   . Worried About Charity fundraiser in the Last Year: Not on file  . Ran Out of Food in the Last Year: Not on file  Transportation Needs: No Transportation Needs  . Lack of Transportation (Medical): No  . Lack of Transportation (Non-Medical): No  Physical Activity:   . Days of Exercise per Week: Not on file  . Minutes of Exercise per Session: Not on file  Stress:   . Feeling of Stress : Not on file  Social Connections:   . Frequency of Communication with Friends and Family: Not on file  . Frequency of Social Gatherings with Friends and Family: Not on file  . Attends Religious Services: Not on file  . Active Member of Clubs or Organizations: Not on file  . Attends Archivist Meetings: Not on file  . Marital Status: Not on file    Tobacco Counseling Counseling given: Not Answered   Clinical Intake:                 Diabetic?No         Activities of Daily Living No flowsheet data found.  Patient Care Team: [name], [name], MD as PCP - General (Family Medicine) [name] November, MD as PCP - Cardiology (Cardiology) [name], MD as Consulting Physician (Cardiology) Earnie Larsson, [facility] as Pharmacist (Pharmacist)  Indicate any recent Medical Services you may have received from other than [facility] providers in the past year (date may be approximate).     Assessment:   This is a routine wellness examination for [place].  Hearing/Vision screen No exam data present  Dietary issues and exercise activities discussed:    Goals    . Pharmacy Care Plan     CARE PLAN ENTRY  Current Barriers:  . Chronic Disease Management support, education, and care coordination needs related to Hyperlipidemia, Heart Failure, COPD, and back pain   Hyperlipidemia . Pharmacist Clinical Goal(s): o Over the next 90 days,  patient will work with PharmD and providers to maintain LDL goal < 70 . Current regimen:  o Simvastatin 20mg , 1 tablet at bedtime  . Interventions: o Recommended nighttime administration for simvastatin . Patient  self care activities - Over the next 90 days, patient will: o Continue current medications.  o Schedule visit with Dr. [name] and obtain cholesterol levels.  Heart failure . Pharmacist Clinical Goal(s) o Over the next 90 days, patient will work with PharmD and providers to monitor fluid retention.  . Current regimen:  o Furosemide 20mg , 1 tablet once daily as needed for fluid or edema . Interventions: o We discussed weighing daily; if you gain more than 3 pounds in one day or 5 pounds in one week call your doctor . Patient self care activities - Over the next 90 days, patient will: o Continue current medications and monitor fluid retention.  COPD . Pharmacist Clinical Goal(s) o Over the next 90 days, patient will work with PharmD and providers to Prevent worsening of shortness of breath and hospitalizations. . Current regimen:   Albuterol 0.083% nebulizer solution, use 1 vial in nebulizer six times daily  Albuterol (Ventolin HFA) 174mcg/ act inhaler, inhale 2 puffs every six hours as needed for wheezing   Brovana 43mcg/2 mL nebulizer solution, use 1 vial in nebulizer twice daily  Budesonide 0.5mg / 2 ml, use 1 vial twice daily . Patient self care activities o Patient will continue current medications.   Back pain . Pharmacist Clinical Goal(s) o Over the next 90 days, patient will work with PharmD and providers to minimize pain level.  . Current regimen:   Gabapentin 100mg , 1 capsule twice daily   Hydrocodone/ APAP 5/325mg , 0.5 tablet every twelve hours as needed for severe pain   Tizanidine 2mg , 0.5 to 1 tablet at bedtime as needed for muscle spasms   Acetaminophen 500mg , 2 tablets every eight hours as needed for mild pain, moderate pain or  headache . Interventions: o We discussed: maximum daily amount of APAP (3000mg / day)  . Patient self care activities o Patient will continue current medications.   Medication management . Pharmacist Clinical Goal(s): o Over the next 90 days, patient will work with PharmD and providers to maintain optimal medication adherence . [facility] . Interventions o Comprehensive medication review performed. o Continue current medication management strategy o For medication assistance, apply to [place] by:  - Call: [phone] - Website: [email or web address] . Patient self care activities - Over the next 90 days, patient will: o Take medications as prescribed o Report any questions or concerns to PharmD and/or provider(s)  Initial goal documentation       Depression Screen PHQ 2/9 Scores 06/23/2020 08/10/2019 06/30/2018 04/08/2016 01/24/2015 01/13/2013  PHQ - 2 Score 1 1 0 0 0 0  PHQ- 9 Score 5 - - - - -    Fall Risk Fall Risk  06/23/2020 04/08/2016 01/24/2015 01/13/2013  Falls in the past year? 0 No No No  Number falls in past yr: 0 - - -  Injury with Fall? 0 - - -  Risk for fall due to : Impaired balance/gait;Medication side effect;Orthopedic patient - - -  Follow up Education provided - - -    FALL RISK PREVENTION PERTAINING TO THE HOME:  Any stairs in or around the home? {YES/NO:21197} If so, are there any without handrails? {YES/NO:21197} Home free of loose throw rugs in walkways, pet beds, electrical cords, etc? Yes  Adequate lighting in your home to reduce risk of falls? Yes   ASSISTIVE DEVICES UTILIZED TO PREVENT FALLS:  Life alert? {YES/NO:21197} Use of a cane, walker or w/c? {YES/NO:21197} Grab bars in the bathroom? {YES/NO:21197} Shower chair or bench in shower? {YES/NO:21197}  Elevated toilet seat or a handicapped toilet? {YES/NO:21197}  TIMED UP AND GO:  Was the test performed? No .    Cognitive Function:         Immunizations Immunization History  Administered Date(s) Administered  . Fluad Quad(high Dose 65+) 08/10/2019, 08/31/2020  . Influenza Split 07/17/2011, 08/09/2012, 07/21/2013  . Influenza Whole 08/16/2008, 09/12/2009, 07/18/2010  . Influenza, High Dose Seasonal PF 11/19/2016, 09/02/2017, 06/30/2018  . Influenza,inj,Quad PF,6+ Mos 09/06/2014  . Influenza-Unspecified 10/03/2015  . PFIZER SARS-COV-2 Vaccination 12/01/2019, 12/27/2019  . Pneumococcal Conjugate-13 09/06/2014  . Pneumococcal Polysaccharide-23 01/10/2009  . Tdap 01/16/2013  . Zoster 07/21/2013    TDAP status: Up to date  Flu Vaccine status: Up to date  Pneumococcal vaccine status: Up to date  Covid-19 vaccine status: Completed vaccines  Qualifies for Shingles Vaccine? Yes   Zostavax completed Yes   Shingrix Completed?: No.    Education has been provided regarding the importance of this vaccine. Patient has been advised to call insurance company to determine out of pocket expense if they have not yet received this vaccine. Advised may also receive vaccine at local pharmacy or Health Dept. Verbalized acceptance and understanding.  Screening Tests Health Maintenance  Topic Date Due  . TETANUS/TDAP  01/17/2023  . INFLUENZA VACCINE  Completed  . COVID-19 Vaccine  Completed  . PNA vac Low Risk Adult  Completed  . DEXA SCAN  Discontinued    Health Maintenance  There are no preventive care reminders to display for this patient.  Colorectal cancer screening: No longer required.   Mammogram status: No longer required due to age.  Additional Screening:  Hepatitis C Screening: {DOES NOT does:27190::"does not"} qualify; Completed ***  Vision Screening: Recommended annual ophthalmology exams for early detection of glaucoma and other disorders of the eye. Is the patient up to date with their annual eye exam?  {YES/NO:21197} Who is the provider or what is the name of the office in which the patient attends annual  eye exams? *** If pt is not established with a provider, would they like to be referred to a provider to establish care? {YES/NO:21197}.   Dental Screening: Recommended annual dental exams for proper oral hygiene  Community Resource Referral / Chronic Care Management: CRR required this visit?  {YES/NO:21197}  CCM required this visit?  {YES/NO:21197}     Plan:     I have personally reviewed and noted the following in the patient's chart:   . Medical and social history . Use of alcohol, tobacco or illicit drugs  . Current medications and supplements . Functional ability and status . Nutritional status . Physical activity . Advanced directives . List of other physicians . Hospitalizations, surgeries, and ER visits in previous 12 months . Vitals . Screenings to include cognitive, depression, and falls . Referrals and appointments  In addition, I have reviewed and discussed with patient certain preventive protocols, quality metrics, and best practice recommendations. A written personalized care plan for preventive services as well as general preventive health recommendations were provided to patient.     Jessica Brace, LPN   [id number]   Nurse Notes: ***

## 2020-10-05 ENCOUNTER — Other Ambulatory Visit: Payer: Self-pay | Admitting: Internal Medicine

## 2020-10-05 DIAGNOSIS — J449 Chronic obstructive pulmonary disease, unspecified: Secondary | ICD-10-CM

## 2020-10-08 DIAGNOSIS — Z23 Encounter for immunization: Secondary | ICD-10-CM | POA: Diagnosis not present

## 2020-10-10 ENCOUNTER — Other Ambulatory Visit: Payer: Self-pay | Admitting: Family Medicine

## 2020-10-10 DIAGNOSIS — F411 Generalized anxiety disorder: Secondary | ICD-10-CM

## 2020-10-10 DIAGNOSIS — G4700 Insomnia, unspecified: Secondary | ICD-10-CM

## 2020-10-10 MED ORDER — ESCITALOPRAM OXALATE 20 MG PO TABS: 20.0000 mg | ORAL_TABLET | Freq: Every day | ORAL | 1 refills | Status: DC

## 2020-10-10 NOTE — Telephone Encounter (Signed)
Patient is calling and requesting a refill for escitalopram (LEXAPRO) 20 MG tablet and ALPRAZolam (XANAX) 0.5 MG tablet [facility] [place] [place]  Phone:  [phone] Fax:  [phone]  CB is [phone]

## 2020-11-22 ENCOUNTER — Other Ambulatory Visit: Payer: Self-pay | Admitting: Family Medicine

## 2020-11-22 DIAGNOSIS — G4700 Insomnia, unspecified: Secondary | ICD-10-CM

## 2020-11-22 DIAGNOSIS — F411 Generalized anxiety disorder: Secondary | ICD-10-CM

## 2020-11-23 ENCOUNTER — Telehealth: Payer: Self-pay | Admitting: Family Medicine

## 2020-11-23 NOTE — Telephone Encounter (Signed)
Left message for patient to call back and schedule Medicare Annual Wellness Visit (AWV) either virtually or in office.   Last AWV no information please schedule at anytime with [facility] Nurse Health Advisor 1 or 2   This should be a 45 minute visit. 

## 2020-11-26 ENCOUNTER — Other Ambulatory Visit: Payer: Self-pay | Admitting: Internal Medicine

## 2020-11-26 ENCOUNTER — Other Ambulatory Visit: Payer: Self-pay | Admitting: Family Medicine

## 2020-11-26 DIAGNOSIS — G4700 Insomnia, unspecified: Secondary | ICD-10-CM

## 2020-11-26 DIAGNOSIS — J449 Chronic obstructive pulmonary disease, unspecified: Secondary | ICD-10-CM

## 2020-11-26 DIAGNOSIS — J42 Unspecified chronic bronchitis: Secondary | ICD-10-CM

## 2020-11-26 DIAGNOSIS — F411 Generalized anxiety disorder: Secondary | ICD-10-CM

## 2020-11-27 ENCOUNTER — Telehealth: Payer: Self-pay | Admitting: Family Medicine

## 2020-11-27 NOTE — Telephone Encounter (Signed)
Needs appointment

## 2020-11-27 NOTE — Telephone Encounter (Signed)
Pts daughter is calling in stating that the pt is out of Rx alprazolam Duanne Moron) 0.5 and hydrocodone-acetaminophen (NORCO) 5-325 MG  [facility] [address]

## 2020-11-27 NOTE — Telephone Encounter (Signed)
Patient is scheduled for a telephone visit 11/28/2020 at 3 PM

## 2020-11-28 ENCOUNTER — Telehealth (INDEPENDENT_AMBULATORY_CARE_PROVIDER_SITE_OTHER): Payer: Medicare Other | Admitting: Family Medicine

## 2020-11-28 ENCOUNTER — Encounter: Payer: Self-pay | Admitting: Family Medicine

## 2020-11-28 VITALS — Ht 64.0 in

## 2020-11-28 DIAGNOSIS — M5441 Lumbago with sciatica, right side: Secondary | ICD-10-CM | POA: Diagnosis not present

## 2020-11-28 DIAGNOSIS — F411 Generalized anxiety disorder: Secondary | ICD-10-CM

## 2020-11-28 DIAGNOSIS — M5442 Lumbago with sciatica, left side: Secondary | ICD-10-CM

## 2020-11-28 DIAGNOSIS — G4700 Insomnia, unspecified: Secondary | ICD-10-CM

## 2020-11-28 MED ORDER — HYDROCODONE-ACETAMINOPHEN 5-325 MG PO TABS: 0.5000 | ORAL_TABLET | Freq: Two times a day (BID) | ORAL | 0 refills | Status: DC | PRN

## 2020-11-28 MED ORDER — ALPRAZOLAM 0.5 MG PO TABS
0.5000 mg | ORAL_TABLET | Freq: Two times a day (BID) | ORAL | 3 refills | Status: DC | PRN
Start: 2020-11-28 — End: 2021-04-15

## 2020-11-28 NOTE — Progress Notes (Signed)
Virtual Visit via Telephone Note  I connected with [name] on 11/28/20 at  3:00 PM EST by telephone and verified that I am speaking with the correct person using two identifiers.   I discussed the limitations, risks, security and privacy concerns of performing an evaluation and management service by telephone and the availability of in person appointments. I also discussed with the patient that there may be a patient responsible charge related to this service. The patient expressed understanding and agreed to proceed.  Location patient: home Location provider: work or home office Participants present for the call: patient, provider Patient did not have a visit in the prior 7 days to address this/these issue(s).  History of Present Illness: Jessica [name] is a 87 yo female with hx of COPD,anxiety,chronic pain,CAD,and severe aortic stenosis s/p TVAR following on some of her chronic problems. Because [name]'s hearing loss, so her daughter provides hx No new problems since her last visit.  Bilateral back pain with radiculopathy: She is on Hydrocodone-Acetaminophen 5-325 mg 1/2 tab daily as needed. Her daughter manages her medications and stays with her when she takes it. It helps with acute episodes of severe legs pain. Negative for LE edema or erythema.  She does not take medication daily. She has not had side effects.  Using her walker around the house, no falls.  Anxiety: Exacerbated by respiratory symptoms. She is on Lexapro 20 mg daily and Alprazolam 0.5 mg bid prn. Alprazolam at bedtime helps her sleep. She has been on medication for many years. Problem is stable.  She has no concerns today.  Observations/Objective: Patient sounds cheerful and well on the phone. I do not appreciate any SOB. Speech and thought processing are grossly intact. Patient reported vitals:Ht 5\' 4"  (1.626 m)   BMI 18.88 kg/m    Assessment and Plan: 1. Insomnia, unspecified  type Alprazolam 0.5 mg at bedtime to continue. Good sleep hygiene.  - ALPRAZolam (XANAX) 0.5 MG tablet; Take 1 tablet (0.5 mg total) by mouth 2 (two) times daily as needed for anxiety.  Dispense: 60 tablet; Refill: 3  2. Bilateral low back pain with bilateral sciatica, unspecified chronicity Stable. Medication is helping with pain. We discussed side effects and risk when also taking opioids. Fall precautions.  - HYDROcodone-acetaminophen (NORCO) 5-325 MG tablet; Take 0.5 tablets by mouth every 12 (twelve) hours as needed for severe pain. Follow up in 09/2020.  Dispense: 20 tablet; Refill: 0  3. Generalized anxiety disorder Stable. She has tolerated medication well. She and her daughter understand the risk of side effects given her age and severe COPD among some. Continue same dose of Alprazolam and Lexapro.  - ALPRAZolam (XANAX) 0.5 MG tablet; Take 1 tablet (0.5 mg total) by mouth 2 (two) times daily as needed for anxiety.  Dispense: 60 tablet; Refill: 3   Follow Up Instructions: Return in about 4 months (around 03/28/2021) for anxiety and pain..  I did not refer this patient for an OV in the next 24 hours for this/these issue(s).  I discussed the assessment and treatment plan with the patient. [name] and her daughter were provided an opportunity to ask questions and all were answered. They agreed with the plan and demonstrated an understanding of the instructions.   The patient was advised to call back or seek an in-person evaluation if the symptoms worsen or if the condition fails to improve as anticipated.  I provided 10 minutes of non-face-to-face time during this encounter.   Darcus Edds [name], MD

## 2020-12-04 NOTE — Progress Notes (Signed)
Patient is scheduled for 03/29/2021 at 2 PM for a telephone visit

## 2021-01-10 ENCOUNTER — Telehealth: Payer: Self-pay | Admitting: Internal Medicine

## 2021-01-10 MED ORDER — AZITHROMYCIN 250 MG PO TABS: ORAL_TABLET | ORAL | 0 refills | Status: AC

## 2021-01-10 MED ORDER — PREDNISONE 20 MG PO TABS: 20.0000 mg | ORAL_TABLET | Freq: Every day | ORAL | 0 refills | Status: DC

## 2021-01-10 NOTE — Telephone Encounter (Signed)
Please prescribed Pred 20 mg daily for 7 days Z pak

## 2021-01-10 NOTE — Telephone Encounter (Signed)
Called and spoke to patient's daughter, sonji (DPR). [name] stated that patient is experiencing increased sob, chest tightness, mild wheezing and prod cough with clear sputum(baseline). Sx developed 2 days ago. She is using albuterol HFA 4-5x daily, Pulmicort BID and Brovana BID. She has had covid vaccines and flu shot.  [name] is requesting Rx for prednisone and abx.  Dr. Mortimer Fries, please advise. Thanks

## 2021-01-10 NOTE — Telephone Encounter (Signed)
Patient's daughter, [name](DPR) is aware of below recommendations. She voiced her understanding and had no further questions. Rx for prednisone and zpak has been sent to preferred pharmacy.  Nothing further needed at this time.

## 2021-02-01 ENCOUNTER — Other Ambulatory Visit: Payer: Self-pay | Admitting: Family Medicine

## 2021-02-01 DIAGNOSIS — M5442 Lumbago with sciatica, left side: Secondary | ICD-10-CM

## 2021-02-01 DIAGNOSIS — M5441 Lumbago with sciatica, right side: Secondary | ICD-10-CM

## 2021-02-01 NOTE — Telephone Encounter (Signed)
Patient daughter [name] states she has a very sore cyst on the inside of her arm near the shoulder.   I offered an appointment but she is requesting a call from Dr. [name] as to what she should do.  She states that she does not want to have to bring her in unless she knows for sure Dr. [name] would lance it.  Please advise.

## 2021-02-01 NOTE — Telephone Encounter (Signed)
It depends how big and deep it is we can do it here in the office. Warm compresses a few times during the day may help. She needs to be seen if lesion does not spontaneously drain or if it gets worse. Thanks, BJ

## 2021-02-03 ENCOUNTER — Other Ambulatory Visit: Payer: Self-pay

## 2021-02-03 ENCOUNTER — Emergency Department (HOSPITAL_COMMUNITY): Payer: Medicare Other

## 2021-02-03 ENCOUNTER — Encounter (HOSPITAL_COMMUNITY): Payer: Self-pay

## 2021-02-03 ENCOUNTER — Emergency Department (HOSPITAL_COMMUNITY)
Admission: EM | Admit: 2021-02-03 | Discharge: 2021-02-03 | Disposition: A | Discharge status: Home / Self Care | Payer: Medicare Other | Attending: Emergency Medicine | Admitting: Emergency Medicine

## 2021-02-03 DIAGNOSIS — Z20822 Contact with and (suspected) exposure to covid-19: Secondary | ICD-10-CM | POA: Diagnosis not present

## 2021-02-03 DIAGNOSIS — Z955 Presence of coronary angioplasty implant and graft: Secondary | ICD-10-CM | POA: Insufficient documentation

## 2021-02-03 DIAGNOSIS — Z8709 Personal history of other diseases of the respiratory system: Secondary | ICD-10-CM | POA: Insufficient documentation

## 2021-02-03 DIAGNOSIS — M7989 Other specified soft tissue disorders: Secondary | ICD-10-CM | POA: Diagnosis not present

## 2021-02-03 DIAGNOSIS — Z87891 Personal history of nicotine dependence: Secondary | ICD-10-CM | POA: Diagnosis not present

## 2021-02-03 DIAGNOSIS — J9611 Chronic respiratory failure with hypoxia: Secondary | ICD-10-CM

## 2021-02-03 DIAGNOSIS — L02413 Cutaneous abscess of right upper limb: Secondary | ICD-10-CM | POA: Diagnosis not present

## 2021-02-03 DIAGNOSIS — I5032 Chronic diastolic (congestive) heart failure: Secondary | ICD-10-CM | POA: Insufficient documentation

## 2021-02-03 DIAGNOSIS — Z7952 Long term (current) use of systemic steroids: Secondary | ICD-10-CM | POA: Diagnosis not present

## 2021-02-03 DIAGNOSIS — Z28310 Unvaccinated for covid-19: Secondary | ICD-10-CM | POA: Diagnosis not present

## 2021-02-03 DIAGNOSIS — R0689 Other abnormalities of breathing: Secondary | ICD-10-CM | POA: Diagnosis not present

## 2021-02-03 DIAGNOSIS — Z7982 Long term (current) use of aspirin: Secondary | ICD-10-CM | POA: Diagnosis not present

## 2021-02-03 DIAGNOSIS — I2510 Atherosclerotic heart disease of native coronary artery without angina pectoris: Secondary | ICD-10-CM | POA: Diagnosis not present

## 2021-02-03 DIAGNOSIS — F0390 Unspecified dementia without behavioral disturbance: Secondary | ICD-10-CM | POA: Insufficient documentation

## 2021-02-03 DIAGNOSIS — R059 Cough, unspecified: Secondary | ICD-10-CM | POA: Diagnosis not present

## 2021-02-03 DIAGNOSIS — R0902 Hypoxemia: Secondary | ICD-10-CM | POA: Diagnosis not present

## 2021-02-03 DIAGNOSIS — J80 Acute respiratory distress syndrome: Secondary | ICD-10-CM | POA: Diagnosis not present

## 2021-02-03 DIAGNOSIS — R0602 Shortness of breath: Secondary | ICD-10-CM | POA: Insufficient documentation

## 2021-02-03 DIAGNOSIS — Z9981 Dependence on supplemental oxygen: Secondary | ICD-10-CM | POA: Diagnosis not present

## 2021-02-03 DIAGNOSIS — J449 Chronic obstructive pulmonary disease, unspecified: Secondary | ICD-10-CM | POA: Insufficient documentation

## 2021-02-03 DIAGNOSIS — R069 Unspecified abnormalities of breathing: Secondary | ICD-10-CM | POA: Diagnosis not present

## 2021-02-03 LAB — CBC
HCT: 34.6 % — ABNORMAL LOW (ref 36.0–46.0)
Hemoglobin: 10.7 g/dL — ABNORMAL LOW (ref 12.0–15.0)
MCH: 31.3 pg (ref 26.0–34.0)
MCHC: 30.9 g/dL (ref 30.0–36.0)
MCV: 101.2 fL — ABNORMAL HIGH (ref 80.0–100.0)
Platelets: 236 10*3/uL (ref 150–400)
RBC: 3.42 MIL/uL — ABNORMAL LOW (ref 3.87–5.11)
RDW: 14.4 % (ref 11.5–15.5)
WBC: 10.2 10*3/uL (ref 4.0–10.5)
nRBC: 0 % (ref 0.0–0.2)

## 2021-02-03 LAB — BASIC METABOLIC PANEL
Anion gap: 7 (ref 5–15)
BUN: 27 mg/dL — ABNORMAL HIGH (ref 8–23)
CO2: 32 mmol/L (ref 22–32)
Calcium: 9.7 mg/dL (ref 8.9–10.3)
Chloride: 99 mmol/L (ref 98–111)
Creatinine, Ser: 0.82 mg/dL (ref 0.44–1.00)
GFR, Estimated: >60 mL/min (ref >60)
Glucose, Bld: 88 mg/dL (ref 70–99)
Potassium: 4.2 mmol/L (ref 3.5–5.1)
Sodium: 138 mmol/L (ref 135–145)

## 2021-02-03 LAB — RESP PANEL BY RT-PCR (FLU A&B, COVID) ARPGX2
Influenza A by PCR: NEGATIVE
Influenza B by PCR: NEGATIVE
SARS Coronavirus 2 by RT PCR: NEGATIVE

## 2021-02-03 MED ORDER — ACETAMINOPHEN 325 MG PO TABS
650.0000 mg | ORAL_TABLET | Freq: Once | ORAL | Status: AC
  Administered 2021-02-03: 650 mg via ORAL
  Filled 2021-02-03: qty 2

## 2021-02-03 MED ORDER — ALBUTEROL SULFATE (2.5 MG/3ML) 0.083% IN NEBU
5.0000 mg | INHALATION_SOLUTION | Freq: Once | RESPIRATORY_TRACT | Status: AC
  Administered 2021-02-03: 5 mg via RESPIRATORY_TRACT
  Filled 2021-02-03: qty 6

## 2021-02-03 MED ORDER — CEPHALEXIN 500 MG PO CAPS: 500.0000 mg | ORAL_CAPSULE | Freq: Three times a day (TID) | ORAL | 0 refills | Status: DC

## 2021-02-03 MED ORDER — CEPHALEXIN 250 MG PO CAPS
500.0000 mg | ORAL_CAPSULE | Freq: Once | ORAL | Status: AC
  Administered 2021-02-03: 500 mg via ORAL
  Filled 2021-02-03: qty 2

## 2021-02-03 MED ORDER — LIDOCAINE-EPINEPHRINE (PF) 2 %-1:200000 IJ SOLN
20.0000 mL | Freq: Once | INTRAMUSCULAR | Status: AC
  Administered 2021-02-03: 20 mL
  Filled 2021-02-03: qty 20

## 2021-02-03 MED ORDER — IPRATROPIUM BROMIDE 0.02 % IN SOLN
0.5000 mg | Freq: Once | RESPIRATORY_TRACT | Status: AC
  Administered 2021-02-03: 0.5 mg via RESPIRATORY_TRACT
  Filled 2021-02-03: qty 2.5

## 2021-02-03 NOTE — ED Notes (Signed)
Suture cart at bedside. Ready for MD

## 2021-02-03 NOTE — Discharge Instructions (Addendum)
It was our pleasure to provide your ER care today - we hope that you feel better.  Keep area very clean.  Follow up with primary care doctor, or urgent care, in 2-3 days time for wound check and packing removal. Take keflex (antibiotic) as prescribed. You may take acetaminophen or ibuprofen as need.  When travelling away from home, make sure to stay on your home oxygen at all times.   Return to ER if worse, new symptoms, spreading redness, increased swelling, severe pain, high fevers, increased trouble breathing, or other concern.

## 2021-02-03 NOTE — ED Provider Notes (Signed)
[name] Provider Note   CSN: [id number] Arrival date & time: 02/03/21  1709     History Chief Complaint  Patient presents with  . Shortness of Breath    SOB for past half hour, sent via urgent care, family on way here    [name] is a 87 y.o. female.  Patient went to urgent care for mass/cyst to right upper arm. Patient is on home o2, but was not on any oxygen at urgent care and they noticed that pulse ox is low, so they sent pt to ED. Patient very limited historian, ?dementia, level 5 caveat. Pt is noted to have productive cough, yellowish/green sputum. No chest pain. No leg pain or swelling.   The history is provided by the patient and the EMS personnel. The history is limited by the condition of the patient.  Shortness of Breath Associated symptoms: cough   Associated symptoms: no fever and no vomiting        Past Medical History:  Diagnosis Date  . Anxiety   . congenital nystagmus   . COPD (chronic obstructive pulmonary disease) ([place])   . Coronary artery disease   . DJD (degenerative joint disease)   . Fibromyalgia   . Low back pain syndrome   . Memory loss   . Other and unspecified hyperlipidemia   . Pancreatic lesion   . Pneumonia   . S/P TAVR (transcatheter aortic valve replacement)    26 mm Edwards Sapien 3 via the TF approach  . Severe aortic stenosis   . Venous insufficiency     Patient Active Problem List   Diagnosis Date Noted  . Pressure injury of skin 09/19/2019  . Acute right hip pain 09/17/2019  . S/P TAVR (transcatheter aortic valve replacement)   . Chronic respiratory failure ([place]) 12/15/2018  . Depression 12/15/2018  . CAD (coronary artery disease) 12/15/2018  . Chronic diastolic CHF (congestive heart failure) ([place]) 12/15/2018  . History of pulmonary embolism 08/25/2018  . Severe aortic stenosis   . Aortic atherosclerosis ([place]) 05/20/2018  . Insomnia 04/08/2016  . Chronic obstructive  pulmonary disease ([place]) 05/07/2015  . Labial cyst 05/07/2015  . Exertional dyspnea 02/26/2015  . Vitamin D deficiency 01/14/2009  . Hyperlipidemia 01/14/2009  . DEGENERATIVE JOINT DISEASE 06/12/2008  . Fibromyalgia 12/15/2007  . Generalized anxiety disorder 12/14/2007    Past Surgical History:  Procedure Laterality Date  . ABDOMINAL HYSTERECTOMY    . ANTERIOR CERVICAL DISCECTOMY    . APPENDECTOMY    . CARPAL TUNNEL RELEASE  12/2011   right arm  . CATARACT EXTRACTION    . CORONARY ATHERECTOMY  07/23/2018   PTCA/orbital atherectomy/DES x 1 proximal to mid LAD  . CORONARY ATHERECTOMY N/A 07/23/2018   Procedure: CORONARY ATHERECTOMY;  Surgeon: Burnell Blanks, MD;  Location: [facility];  Service: Cardiovascular;  Laterality: N/A;  . CORONARY STENT INTERVENTION    . CORONARY STENT INTERVENTION N/A 07/23/2018   Procedure: CORONARY STENT INTERVENTION;  Surgeon: Burnell Blanks, MD;  Location: [facility];  Service: Cardiovascular;  Laterality: N/A;  . EYE SURGERY    . INTRAMEDULLARY (IM) NAIL INTERTROCHANTERIC Right 09/19/2019   Procedure: INTRAMEDULLARY (IM) NAIL INTERTROCHANTRIC;  Surgeon: Renette Butters, MD;  Location: [facility];  Service: Orthopedics;  Laterality: Right;  . LUMBAR LAMINECTOMY    . right knee arthroscopy    . right shoulder replacement  04/2009  . right shoulder surgery  2008   Dr. Noemi Chapel  .  RIGHT/LEFT HEART CATH AND CORONARY ANGIOGRAPHY N/A 07/07/2018   Procedure: RIGHT/LEFT HEART CATH AND CORONARY ANGIOGRAPHY;  Surgeon: Burnell Blanks, MD;  Location: [facility];  Service: Cardiovascular;  Laterality: N/A;  . TEE WITHOUT CARDIOVERSION N/A 03/29/2019   Procedure: TRANSESOPHAGEAL ECHOCARDIOGRAM (TEE);  Surgeon: Burnell Blanks, MD;  Location: [facility];  Service: Open Heart Surgery;  Laterality: N/A;  . TRANSCATHETER AORTIC VALVE REPLACEMENT, TRANSFEMORAL N/A 03/29/2019   Procedure: TRANSCATHETER AORTIC VALVE REPLACEMENT,  TRANSFEMORAL;  Surgeon: Burnell Blanks, MD;  Location: [facility];  Service: Open Heart Surgery;  Laterality: N/A;  . ULTRASOUND GUIDANCE FOR VASCULAR ACCESS  07/07/2018   Procedure: Ultrasound Guidance For Vascular Access;  Surgeon: Burnell Blanks, MD;  Location: [facility];  Service: Cardiovascular;;     OB History   No obstetric history on file.     Family History  Problem Relation Age of Onset  . Other Mother        broken hip  . Other Father        kidney problems  . Colon cancer Neg Hx   . Stomach cancer Neg Hx   . Rectal cancer Neg Hx   . Pancreatic cancer Neg Hx     Social History   Tobacco Use  . Smoking status: Former Smoker    Packs/day: 2.00    Years: 30.00    Pack years: 60.00    Types: Cigarettes    Quit date: 10/28/1991    Years since quitting: 29.2  . Smokeless tobacco: Never Used  Vaping Use  . Vaping Use: Never used  Substance Use Topics  . Alcohol use: Not Currently    Comment: 1 drink per night  . Drug use: No    Home Medications Prior to Admission medications   Medication Sig Start Date End Date Taking? Authorizing Provider  acetaminophen (TYLENOL) 500 MG tablet Take 2 tablets (1,000 mg total) by mouth every 8 (eight) hours as needed for mild pain, moderate pain or headache (pain). 09/21/19   Mercy Riding, MD  albuterol (PROVENTIL) (2.5 MG/3ML) 0.083% nebulizer solution USE 1 VIAL IN NEBULIZER 6 TIMES DAILY - (for rescue) (Max 30 doses per month) 08/30/20   Kasa, Maretta Bees, MD  albuterol (VENTOLIN HFA) 108 (90 Base) MCG/ACT inhaler INHALE 2 PUFFS BY MOUTH EVERY 6 HOURS AS NEEDED FOR WHEEZING 10/05/20   Flora Lipps, MD  ALPRAZolam Duanne Moron) 0.5 MG tablet Take 1 tablet (0.5 mg total) by mouth 2 (two) times daily as needed for anxiety. 11/28/20   [name], [name], MD  AMBULATORY NON FORMULARY MEDICATION Medication Name: Incentive spirometer Use 10-15 times per day 12/14/18   Flora Lipps, MD  aspirin EC 81 MG tablet Take 1 tablet (81 mg  total) by mouth daily. 04/01/19   Eileen Stanford, PA-C  BROVANA 15 MCG/2ML NEBU USE 1 VIAL  IN  NEBULIZER TWICE  DAILY - morning and evening 11/26/20   Flora Lipps, MD  budesonide (PULMICORT) 0.5 MG/2ML nebulizer solution USE 1 VIAL  IN  NEBULIZER TWICE  DAILY - Rinse mouth after treatment 11/26/20   Flora Lipps, MD  calcium-vitamin D (OSCAL WITH D) 500-200 MG-UNIT tablet Take 2 tablets by mouth daily with breakfast. 09/22/19   Mercy Riding, MD  escitalopram (LEXAPRO) 20 MG tablet Take 1 tablet (20 mg total) by mouth daily. 10/10/20   [name], [name], MD  fluticasone (FLONASE) 50 MCG/ACT nasal spray Place 2 sprays into both nostrils daily. 12/18/18  Ghimire, Henreitta Leber, MD  HYDROcodone-acetaminophen (NORCO) 5-325 MG tablet Take 0.5 tablets by mouth every 12 (twelve) hours as needed for severe pain. Follow up in 09/2020. 11/28/20   [name], [name], MD  loratadine (CLARITIN) 10 MG tablet Take 1 tablet (10 mg total) by mouth daily. 12/18/18   Ghimire, Henreitta Leber, MD  polyethylene glycol (MIRALAX / GLYCOLAX) 17 g packet Take 17 g by mouth daily as needed for mild constipation. 09/21/19   Mercy Riding, MD  predniSONE (DELTASONE) 20 MG tablet Take 1 tablet (20 mg total) by mouth daily with breakfast. 01/10/21   Flora Lipps, MD  Respiratory Therapy Supplies (FLUTTER) DEVI 1 each by Does not apply route daily. 12/14/18   Flora Lipps, MD  senna-docusate (SENOKOT-S) 8.6-50 MG tablet Take 2 tablets by mouth 2 (two) times daily as needed for mild constipation or moderate constipation. 09/21/19   Mercy Riding, MD  simvastatin (ZOCOR) 20 MG tablet Take 1 tablet (20 mg total) by mouth at bedtime. 12/23/17   [name], MD  tiZANidine (ZANAFLEX) 2 MG tablet TAKE 0.5-1 TABLETS (1-2 MG TOTAL) BY MOUTH AT BEDTIME AS NEEDED FOR MUSCLE SPASMS. 09/24/20   [name], [name], MD  vitamin B-12 (CYANOCOBALAMIN) 1000 MCG tablet Take 1,000 mcg by mouth daily.    [provider]    Allergies    Contrast media  [iodinated diagnostic agents] and Iohexol  Review of Systems   Review of Systems  Unable to perform ROS: Dementia  Constitutional: Negative for fever.  Respiratory: Positive for cough and shortness of breath.   Gastrointestinal: Negative for vomiting.  Musculoskeletal:       Cyst/mass right upper arm.   level 5 caveat, poor historian, dementia  Physical Exam Updated Vital Signs BP (!) 118/103 (BP Location: Right Arm)   Pulse (!) 102   Temp 98.6 F (37 C) (Oral)   Resp (!) 32   Ht 1.626 m (5\' 4" )   Wt 40.8 kg   SpO2 100%   BMI 15.45 kg/m   Physical Exam Vitals and nursing note reviewed.  Constitutional:      Appearance: Normal appearance. She is well-developed.  HENT:     Head: Atraumatic.     Nose: Nose normal.     Mouth/Throat:     Mouth: Mucous membranes are moist.  Eyes:     General: No scleral icterus.    Conjunctiva/sclera: Conjunctivae normal.  Neck:     Trachea: No tracheal deviation.  Cardiovascular:     Rate and Rhythm: Normal rate and regular rhythm.     Pulses: Normal pulses.     Heart sounds: Normal heart sounds. No murmur heard. No friction rub. No gallop.   Pulmonary:     Effort: Pulmonary effort is normal. No respiratory distress.     Breath sounds: Wheezing present.  Abdominal:     General: Bowel sounds are normal. There is no distension.     Palpations: Abdomen is soft.     Tenderness: There is no abdominal tenderness.  Genitourinary:    Comments: No cva tenderness.  Musculoskeletal:     Cervical back: Normal range of motion and neck supple. No rigidity. No muscular tenderness.     Right lower leg: No edema.     Left lower leg: No edema.     Comments: ~ 8 cm soft, fleshy, mildly tender, mobile, non erythematous mass to right upper arm. Radial pulse 2+.   Skin:    General: Skin is warm  and dry.     Findings: No rash.  Neurological:     Mental Status: She is alert.     Comments: Alert, speech normal.   Psychiatric:        Mood and  Affect: Mood normal.     ED Results / Procedures / Treatments   Labs (all labs ordered are listed, but only abnormal results are displayed) Results for orders placed or performed during the hospital encounter of 02/03/21  Resp Panel by RT-PCR (Flu A&B, Covid) Nasopharyngeal Swab   Specimen: Nasopharyngeal Swab; Nasopharyngeal(NP) swabs in vial transport medium  Result Value Ref Range   SARS Coronavirus 2 by RT PCR NEGATIVE NEGATIVE   Influenza A by PCR NEGATIVE NEGATIVE   Influenza B by PCR NEGATIVE NEGATIVE  CBC  Result Value Ref Range   WBC 10.2 4.0 - 10.5 K/uL   RBC 3.42 (L) 3.87 - 5.11 MIL/uL   Hemoglobin 10.7 (L) 12.0 - 15.0 g/dL   HCT 34.6 (L) 36.0 - 46.0 %   MCV 101.2 (H) 80.0 - 100.0 fL   MCH 31.3 26.0 - 34.0 pg   MCHC 30.9 30.0 - 36.0 g/dL   RDW 14.4 11.5 - 15.5 %   Platelets 236 150 - 400 K/uL   nRBC 0.0 0.0 - 0.2 %  Basic metabolic panel  Result Value Ref Range   Sodium 138 135 - 145 mmol/L   Potassium 4.2 3.5 - 5.1 mmol/L   Chloride 99 98 - 111 mmol/L   CO2 32 22 - 32 mmol/L   Glucose, Bld 88 70 - 99 mg/dL   BUN 27 (H) 8 - 23 mg/dL   Creatinine, Ser 0.82 0.44 - 1.00 mg/dL   Calcium 9.7 8.9 - 10.3 mg/dL   GFR, Estimated >60 >60 mL/min   Anion gap 7 5 - 15   DG Chest Port 1 View  Result Date: 02/03/2021 CLINICAL DATA:  Shortness of breath. EXAM: PORTABLE CHEST 1 VIEW COMPARISON:  October 05, 2019 FINDINGS: Stable cardiac silhouette, mildly enlarged. Stable valvular prostheses. Calcific atherosclerotic disease and tortuosity of the aorta. No evidence of focal airspace consolidation. No acute osseous abnormalities. Right humeral prosthesis again noted. IMPRESSION: No evidence of focal airspace consolidation. Electronically Signed   By: Fidela Salisbury M.D.   On: 02/03/2021 18:26    EKG None  Radiology No results found.  Procedures .Marland KitchenIncision and Drainage  Date/Time: 02/03/2021 7:14 PM Performed by: Lajean Saver, MD Authorized by: Lajean Saver, MD    Consent:    Consent given by:  Patient Location:    Type:  Abscess   Location:  Upper extremity   Upper extremity location:  Arm   Arm location:  R upper arm Pre-procedure details:    Skin preparation:  Povidone-iodine Anesthesia:    Anesthesia method:  Local infiltration   Local anesthetic:  Lidocaine 2% WITH epi Procedure type:    Complexity:  Complex Procedure details:    Incision types:  Elliptical   Incision depth:  Subcutaneous   Wound management:  Probed and deloculated and irrigated with saline   Drainage:  Purulent   Drainage amount:  Copious   Wound treatment:  Wound left open and drain placed   Packing materials:  1/2 in iodoform gauze Post-procedure details:    Procedure completion:  Tolerated well, no immediate complications     Medications Ordered in ED Medications  albuterol (PROVENTIL) (2.5 MG/3ML) 0.083% nebulizer solution 5 mg (has no administration in time range)  ipratropium (ATROVENT) nebulizer solution  0.5 mg (has no administration in time range)  lidocaine-EPINEPHrine (XYLOCAINE W/EPI) 2 %-1:200000 (PF) injection 20 mL (has no administration in time range)    ED Course  I have reviewed the triage vital signs and the nursing notes.  Pertinent labs & imaging results that were available during my care of the patient were reviewed by me and considered in my medical decision making (see chart for details).    MDM Rules/Calculators/A&P                         Cxr. Labs. Continuous pulse ox and monitor.   Reviewed nursing notes and prior charts for additional history.   When patient placed on her normal o2, sats improve into 90s.   CXR reviewed/interpreted by me - no pna.  Labs reviewed/interpreted by me - chem normal.  Daughter present - she indicates pts breathing appears c/w baseline, uses 3-4 liters ns at home. Also says chronic cough is normal/baseline for patient.  Pt indicates pts current appearance, functional ability, mental status, all  at her baseline.   I and D abscess. Sterile dressing.   Acetaminophen po.   Keflex po.   Pt currently appears stable for d/c.      Final Clinical Impression(s) / ED Diagnoses Final diagnoses:  None    Rx / DC Orders ED Discharge Orders    None       Lajean Saver, MD 02/03/21 [phone]

## 2021-02-03 NOTE — ED Triage Notes (Signed)
Went to urgent care for a cyst on her right arm, no portable oxygen in urgent care so was on room air while waiting and desated to 74 % while waiting having to use accessory muscles with retractions, coughing up grey/green thick mucus, up to 95% on 6L/[place] with rate  Of 40, switched to NRB

## 2021-02-03 NOTE — ED Notes (Signed)
ED Provider at bedside. 

## 2021-02-04 MED ORDER — HYDROCODONE-ACETAMINOPHEN 5-325 MG PO TABS: 0.5000 | ORAL_TABLET | Freq: Two times a day (BID) | ORAL | 0 refills | Status: DC | PRN

## 2021-02-04 NOTE — Telephone Encounter (Signed)
I spoke with pt's daughter. She ended up taking pt to urgent care yesterday; pt's breathing was bad so they called the ambulance and she was taken to [facility]. They lanced the cyst at the ED and packed it; it was infected. Pt is on antibiotics now, pt's daughter will let us know if she doesn't improve. She is scheduled to have the packing taking out in 3 days. Pt needs a refill on her pain medication - last refill was 11/2020 and had an appointment in February. Rx pended for you.

## 2021-02-28 ENCOUNTER — Telehealth: Payer: Self-pay | Admitting: Internal Medicine

## 2021-02-28 MED ORDER — AZITHROMYCIN 250 MG PO TABS: ORAL_TABLET | ORAL | 0 refills | Status: AC

## 2021-02-28 MED ORDER — PREDNISONE 20 MG PO TABS: 20.0000 mg | ORAL_TABLET | Freq: Every day | ORAL | 0 refills | Status: DC

## 2021-02-28 NOTE — Telephone Encounter (Signed)
Rx for prednisone and zpak has been sent to preferred pharmacy.  [name](DPR) is aware and voiced her understanding.  Nothing further needed at this time.

## 2021-02-28 NOTE — Telephone Encounter (Signed)
Prednisone 20 mg daily x5 days, Azithromycin Z-Pak .

## 2021-02-28 NOTE — Telephone Encounter (Signed)
Called and spoke to patient's daughter, [name](DPR) [name] states that patient is experiencing chest congestion, prod cough with thick clear sputum and wheezing. Sx have been present 5-6d. Denied fever, chills or sweats.  Sob is baseline.  patient is using albuterol solution QID and Pulmicort BID with some relief in sx.  Not currently taking prednisone or abx.  She is fully vaccinated against covid and flu.  She would like a Rx for zpak and prednisone.   Dr. Patsey Berthold, please advise. Dr. Mortimer Fries is unavailable.

## 2021-03-09 ENCOUNTER — Other Ambulatory Visit: Payer: Self-pay | Admitting: Family Medicine

## 2021-03-09 DIAGNOSIS — L821 Other seborrheic keratosis: Secondary | ICD-10-CM

## 2021-03-14 ENCOUNTER — Telehealth: Payer: Self-pay | Admitting: Pharmacist

## 2021-03-14 NOTE — Chronic Care Management (AMB) (Addendum)
Chronic Care Management Pharmacy Assistant   Name: Jessica [name]  MRN: [id number] DOB: 06/17/1926  Reason for Encounter: Disease State/ General Assessment Call.   Conditions to be addressed/monitored: CHF, HLD and COPD   Recent office visits:  11/28/20 [name] MD (PCP) - video visit for insomnia and other chronic conditions. No medication changes. Follow up in 4 months.    Recent consult visits:  10/02/20 [name] MD (Cardiology) - seen for follow up TAVR and other issues. Discontinued furosemide 20mg , gabapentin 100mg  and prednisone 10mg . Follow up in 6 months.   08/31/20 Flora Lipps MD (Pulmonary Disease) - seen for follow up COPD and other chronic conditions. Patient started on Prednisone 10mg  for 10 days. Follow up in 6 months.    Hospital visits:  Medication Reconciliation was completed by comparing discharge summary, patient's EMR and Pharmacy list, and upon discussion with patient.  Patient visited [facility] on 02/03/21 due to abscess of right arm and respiratory failure. Sent to ER from Urgent Care same day [facility]. Was seen at urgent care by Enriqueta Shutter PA-C.  New?Medications Started at [facility] Discharge:?? -started Cephalexin 500mg  twice a day due to Abscess.  Medication Changes at Hospital Discharge: None.   Medications Discontinued at Hospital Discharge: None.   Medications that remain the same after Hospital Discharge:??  -All other medications will remain the same.    Medications: Outpatient Encounter Medications as of 03/14/2021  Medication Sig  . acetaminophen (TYLENOL) 500 MG tablet Take 2 tablets (1,000 mg total) by mouth every 8 (eight) hours as needed for mild pain, moderate pain or headache (pain).  Marland Kitchen albuterol (PROVENTIL) (2.5 MG/3ML) 0.083% nebulizer solution USE 1 VIAL IN NEBULIZER 6 TIMES DAILY - (for rescue) (Max 30 doses per month)  . albuterol (VENTOLIN HFA) 108 (90 Base) MCG/ACT  inhaler INHALE 2 PUFFS BY MOUTH EVERY 6 HOURS AS NEEDED FOR WHEEZING  . ALPRAZolam (XANAX) 0.5 MG tablet Take 1 tablet (0.5 mg total) by mouth 2 (two) times daily as needed for anxiety.  . AMBULATORY NON FORMULARY MEDICATION Medication Name: Incentive spirometer Use 10-15 times per day  . aspirin EC 81 MG tablet Take 1 tablet (81 mg total) by mouth daily.  Marland Kitchen BROVANA 15 MCG/2ML NEBU USE 1 VIAL  IN  NEBULIZER TWICE  DAILY - morning and evening  . budesonide (PULMICORT) 0.5 MG/2ML nebulizer solution USE 1 VIAL  IN  NEBULIZER TWICE  DAILY - Rinse mouth after treatment  . calcium-vitamin D (OSCAL WITH D) 500-200 MG-UNIT tablet Take 2 tablets by mouth daily with breakfast.  . cephALEXin (KEFLEX) 500 MG capsule Take 1 capsule (500 mg total) by mouth 3 (three) times daily.  Marland Kitchen escitalopram (LEXAPRO) 20 MG tablet Take 1 tablet (20 mg total) by mouth daily.  . fluticasone (FLONASE) 50 MCG/ACT nasal spray Place 2 sprays into both nostrils daily.  Marland Kitchen HYDROcodone-acetaminophen (NORCO) 5-325 MG tablet Take 0.5 tablets by mouth every 12 (twelve) hours as needed for severe pain.  Marland Kitchen loratadine (CLARITIN) 10 MG tablet Take 1 tablet (10 mg total) by mouth daily.  . polyethylene glycol (MIRALAX / GLYCOLAX) 17 g packet Take 17 g by mouth daily as needed for mild constipation.  . predniSONE (DELTASONE) 20 MG tablet Take 1 tablet (20 mg total) by mouth daily with breakfast.  . Respiratory Therapy Supplies (FLUTTER) DEVI 1 each by Does not apply route daily.  Marland Kitchen senna-docusate (SENOKOT-S) 8.6-50 MG tablet Take 2 tablets  by mouth 2 (two) times daily as needed for mild constipation or moderate constipation.  . simvastatin (ZOCOR) 20 MG tablet Take 1 tablet (20 mg total) by mouth at bedtime.  Marland Kitchen tiZANidine (ZANAFLEX) 2 MG tablet TAKE 0.5-1 TABLETS (1-2 MG TOTAL) BY MOUTH AT BEDTIME AS NEEDED FOR MUSCLE SPASMS.  . vitamin B-12 (CYANOCOBALAMIN) 1000 MCG tablet Take 1,000 mcg by mouth daily.   No facility-administered encounter  medications on file as of 03/14/2021.    . Current COPD regimen:    albuterol (PROVENTIL) (2.5 MG/3ML) 0.083% nebulizer solution - use in nebulizer 6 times per day.  albuterol (VENTOLIN HFA) 108 (90 Base) MCG/ACT inhaler - 2 puffs every 6 hours as needed.  Incentive spirometer -  Use 10-15 times per day.           BROVANA 15 MCG/2ML NEBU - use 1 vial in nebulizer twice daily. Morning and evening.  budesonide (PULMICORT) 0.5 MG/2ML nebulizer solution - use 1 vial in nebulizer twice daily.   predniSONE (DELTASONE) 20 MG tablet - take 1 tablet daily with breakfast. Patient is done with this for now but gets it refilled as needed.  Respiratory Therapy Supplies (FLUTTER) DEVI -  Use as needed.    . No flowsheet data found.  . Any recent hospitalizations or ED visits since last visit with CPP? Yes  . Reports COPD symptoms, including Increased shortness of breath , Shortness of breath at rest, Symptoms worse at night and Wheezing  . What recent interventions/DTPs have been made by any provider to improve breathing since last visit: None.   . Have you had exacerbation/flare-up since last visit? Yes  . What do you do when you are short of breath?  Adhere to COPD Action Plan, Rescue medication, Anxiety medication and Rest  Respiratory Devices/Equipment . Do you have a nebulizer? Yes  . Do you use a Peak Flow Meter? Yes  . Do you use a maintenance inhaler? No  . How often do you forget to use your daily inhaler? N/A  . Do you use a rescue inhaler? Yes  . How often do you use your rescue inhaler?  prn  . Do you use a spacer with your inhaler? No  Adherence Review: . Does the patient have >5 day gap between last estimated fill date for maintenance inhaler medications? No  Comprehensive medication review performed; Spoke to patient regarding cholesterol  Lipid Panel    Component Value Date/Time   CHOL 125 05/27/2017 1052   TRIG 52.0 05/27/2017 1052   HDL 54.70 05/27/2017  1052   LDLCALC 60 05/27/2017 1052   LDLDIRECT 150.0 06/12/2008 0931    10-year ASCVD risk score: The ASCVD Risk score Mikey Bussing DC Jr., et al., 2013) failed to calculate for the following reasons:   The 2013 ASCVD risk score is only valid for ages 70 to 28  . Current antihyperlipidemic regimen:    simvastatin (ZOCOR) 20 MG tablet - take once daily at bedtime.   . Previous antihyperlipidemic medications tried: None.   . ASCVD risk enhancing conditions: age >73 and DM  . What recent interventions/DTPs have been made by any provider to improve Cholesterol control since last CPP Visit: None.   . Any recent hospitalizations or ED visits since last visit with CPP? Yes  . What diet changes have been made to improve Cholesterol?  Patient has a steel rod per her daughter who is her caretaker that is pushing into her throat. Patient can eat some solid food if  chopped up small. Patient is mostly on a liquid diet. [name] gives patient ensures everyday and welch's fruit drinks as well as V8 vegetable juices so patient can get in fruits and vegetables. Patient does not like meat. [name] gives all of patients medications in apple sauce or yogurt. Patient chokes on water so she does not get much water.   . What exercise is being done to improve Cholesterol?  o Patient is not able to much due to her chronic COPD and getting her breathing worked up and anxiety. Patient can ambulate with help to toilet and back and forth ion the home. Patient uses oxygen as well as breathing treatments regularly.   Adherence Review: Does the patient have >5 day gap between last estimated fill dates? Yes  Notes:  Spoke with [name] patients daughter and went over all medications as listed. [name] states that she may have forgotten about the simvastatin and her mother needing to take that one and wants to know if she needs to get filled to give to patient.ALSO, [name] states that patient needs her pain medication refilled. [name]  states that patient is currently done with the prednisone but gets it here and there when patients breathing gets worse or patient gets more weak. [name] also states that the abscess patient was in ER for has resolved and has finished the cephalexin as well. Patient is not able to do much without help and is not able to eat a lot of solid foods. Patient is overdue for follow up with the clinical pharmacist. [name] was agreeable to 05/13/21 at 2:45pm by phone with [name]. Message sent to CPP about simvastatin, pain medication and scheduling appointment.   Star Rating Drugs:   Simvastatin 20mg  - [facility]  [place]  Clinical Pharmacist Assistant [phone]

## 2021-03-18 ENCOUNTER — Other Ambulatory Visit: Payer: Self-pay

## 2021-03-18 DIAGNOSIS — M5441 Lumbago with sciatica, right side: Secondary | ICD-10-CM

## 2021-03-18 MED ORDER — HYDROCODONE-ACETAMINOPHEN 5-325 MG PO TABS: 0.5000 | ORAL_TABLET | Freq: Two times a day (BID) | ORAL | 0 refills | Status: DC | PRN

## 2021-03-18 NOTE — Telephone Encounter (Signed)
-----   Message from [name], [facility] sent at 03/14/2021  4:18 PM EDT ----- Regarding: Hydrocone refill Hi,  Ms. Piccione's daughter is requesting a refill for her hydrocodone. Can you please send this in?  Thank you, Maddie

## 2021-03-18 NOTE — Telephone Encounter (Signed)
Last filled 02/04/21, last OV 11/28/20, next OV 04/01/21

## 2021-03-29 ENCOUNTER — Telehealth: Payer: Medicare Other | Admitting: Family Medicine

## 2021-04-01 ENCOUNTER — Telehealth: Payer: Medicare Other | Admitting: Family Medicine

## 2021-04-06 ENCOUNTER — Other Ambulatory Visit: Payer: Self-pay | Admitting: Family Medicine

## 2021-04-06 DIAGNOSIS — F411 Generalized anxiety disorder: Secondary | ICD-10-CM

## 2021-04-06 NOTE — Progress Notes (Signed)
Cardiology Office Note  Date:  04/08/2021   ID:  MELADY CHOW, DOB 19-Apr-1926, MRN [id number]  PCP:  [name], [name], MD   Chief Complaint  Patient presents with   6 month follow up     Patient c/o shortness of breath and coughing. Medications reviewed by the patient's daughter [name]).     HPI:  Ms. Kratky  is an 87 y/o woman with  aortic valve stenosis, TAVR h/o HL,  fibromyalgia,  CAD, aortic athero on CT 2016 anxiety  moderate COPD/emphysema,  asthmatic bronchitis Previous smoking history but stopped over 30 years ago.  total knee replacement July 2013 by her report Mild gait instability, walks with a cane.  Previous history of falls with trauma to her face/forehead requiring stitches. She presents for follow-up of her TAVR  Daughter presents today, gives hx Leg hurt, 1/2 xanax in the Am "Pee all the time", diaper, 1/2 pain pill Chronic SOB, "always spits up" ("been doing it for years") Lots of coughing yesterday, better today  Did not weigh today "Doin the best she can" Ensure 1 - 4 a day  Lasix PRN Chronic cough/bronchitis On oxygen   Long discussion with daughter concerning mom's condition Knows that she will need help soon  EKG personally reviewed by myself on todays visit Shows normal sinus rhythm rate 86 bpm nonspecific ST abnormality, PVC  Other past medical history reviewed Still working at [employer] , does not want to retire   she continues to work on Production assistant, radio Using a neb treatment, breathing better, spitting up, thick "stuff" Pulmonary, Dr. Mortimer Fries,   significant exposure to secondhand smoke from her daughter    Previous fall Valentine's Day 2017, fractured her right clavicle ( aortic calcification seen on chest x-ray  At that time)  no surgery was performed   difficulty with her memory per the family    said details of her story over and over on her visit today  Echocardiogram April 2016 showing mild to moderate aortic  valve stenosis Carotid ultrasound November 2015 showing mild bilateral carotid disease   Previous trip to the ER with CP in 8/11.  Had post hospital Myoview in 9/11 with normal EF and question of inferior ischemia.   medical therapy pursued at that time. Asymptomatic since then  No recurrent CP or undue dyspnea. Does get fatigued. No palpitations, CHF or syncope.        PMH:   has a past medical history of Anxiety, congenital nystagmus, COPD (chronic obstructive pulmonary disease) ([place]), Coronary artery disease, DJD (degenerative joint disease), Fibromyalgia, Low back pain syndrome, Memory loss, Other and unspecified hyperlipidemia, Pancreatic lesion, Pneumonia, S/P TAVR (transcatheter aortic valve replacement), Severe aortic stenosis, and Venous insufficiency.  PSH:    Past Surgical History:  Procedure Laterality Date   ABDOMINAL HYSTERECTOMY     ANTERIOR CERVICAL DISCECTOMY     APPENDECTOMY     CARPAL TUNNEL RELEASE  12/2011   right arm   CATARACT EXTRACTION     CORONARY ATHERECTOMY  07/23/2018   PTCA/orbital atherectomy/DES x 1 proximal to mid LAD   CORONARY ATHERECTOMY N/A 07/23/2018   Procedure: CORONARY ATHERECTOMY;  Surgeon: Burnell Blanks, MD;  Location: [place] [facility];  Service: Cardiovascular;  Laterality: N/A;   CORONARY STENT INTERVENTION     CORONARY STENT INTERVENTION N/A 07/23/2018   Procedure: CORONARY STENT INTERVENTION;  Surgeon: Burnell Blanks, MD;  Location: [place] [facility];  Service: Cardiovascular;  Laterality: N/A;  EYE SURGERY     INTRAMEDULLARY (IM) NAIL INTERTROCHANTERIC Right 09/19/2019   Procedure: INTRAMEDULLARY (IM) NAIL INTERTROCHANTRIC;  Surgeon: Renette Butters, MD;  Location: [place];  Service: Orthopedics;  Laterality: Right;   LUMBAR LAMINECTOMY     right knee arthroscopy     right shoulder replacement  04/2009   right shoulder surgery  2008   Dr. Noemi Chapel   RIGHT/LEFT HEART CATH AND CORONARY ANGIOGRAPHY N/A 07/07/2018    Procedure: RIGHT/LEFT HEART CATH AND CORONARY ANGIOGRAPHY;  Surgeon: Burnell Blanks, MD;  Location: [place] [facility];  Service: Cardiovascular;  Laterality: N/A;   TEE WITHOUT CARDIOVERSION N/A 03/29/2019   Procedure: TRANSESOPHAGEAL ECHOCARDIOGRAM (TEE);  Surgeon: Burnell Blanks, MD;  Location: [place];  Service: Open Heart Surgery;  Laterality: N/A;   TRANSCATHETER AORTIC VALVE REPLACEMENT, TRANSFEMORAL N/A 03/29/2019   Procedure: TRANSCATHETER AORTIC VALVE REPLACEMENT, TRANSFEMORAL;  Surgeon: Burnell Blanks, MD;  Location: [place];  Service: Open Heart Surgery;  Laterality: N/A;   ULTRASOUND GUIDANCE FOR VASCULAR ACCESS  07/07/2018   Procedure: Ultrasound Guidance For Vascular Access;  Surgeon: Burnell Blanks, MD;  Location: [place] [facility];  Service: Cardiovascular;;    Current Outpatient Medications  Medication Sig Dispense Refill   acetaminophen (TYLENOL) 500 MG tablet Take 2 tablets (1,000 mg total) by mouth every 8 (eight) hours as needed for mild pain, moderate pain or headache (pain). 30 tablet 0   albuterol (PROVENTIL) (2.5 MG/3ML) 0.083% nebulizer solution USE 1 VIAL IN NEBULIZER 6 TIMES DAILY - (for rescue) (Max 30 doses per month) 75 mL 11   albuterol (VENTOLIN HFA) 108 (90 Base) MCG/ACT inhaler INHALE 2 PUFFS BY MOUTH EVERY 6 HOURS AS NEEDED FOR WHEEZING 18 each 5   ALPRAZolam (XANAX) 0.5 MG tablet Take 1 tablet (0.5 mg total) by mouth 2 (two) times daily as needed for anxiety. 60 tablet 3   AMBULATORY NON FORMULARY MEDICATION Medication Name: Incentive spirometer Use 10-15 times per day 1 each 0   aspirin EC 81 MG tablet Take 1 tablet (81 mg total) by mouth daily.     BROVANA 15 MCG/2ML NEBU USE 1 VIAL  IN  NEBULIZER TWICE  DAILY - morning and evening 120 mL 11   budesonide (PULMICORT) 0.5 MG/2ML nebulizer solution USE 1 VIAL  IN  NEBULIZER TWICE  DAILY - Rinse mouth after treatment 120 mL 11   calcium-vitamin D (OSCAL WITH D) 500-200 MG-UNIT tablet  Take 2 tablets by mouth daily with breakfast. 180 tablet 0   escitalopram (LEXAPRO) 20 MG tablet TAKE 1 TABLET BY MOUTH EVERY DAY 90 tablet 0   fluticasone (FLONASE) 50 MCG/ACT nasal spray Place 2 sprays into both nostrils daily. 16 g 0   HYDROcodone-acetaminophen (NORCO) 5-325 MG tablet Take 0.5 tablets by mouth every 12 (twelve) hours as needed for severe pain. 20 tablet 0   loratadine (CLARITIN) 10 MG tablet Take 1 tablet (10 mg total) by mouth daily. 10 tablet 0   polyethylene glycol (MIRALAX / GLYCOLAX) 17 g packet Take 17 g by mouth daily as needed for mild constipation. 14 each 0   Respiratory Therapy Supplies (FLUTTER) DEVI 1 each by Does not apply route daily. 1 each 0   senna-docusate (SENOKOT-S) 8.6-50 MG tablet Take 2 tablets by mouth 2 (two) times daily as needed for mild constipation or moderate constipation. 60 tablet 0   simvastatin (ZOCOR) 20 MG tablet Take 1 tablet (20 mg total) by mouth at bedtime. 90 tablet 3  tiZANidine (ZANAFLEX) 2 MG tablet TAKE 0.5-1 TABLETS (1-2 MG TOTAL) BY MOUTH AT BEDTIME AS NEEDED FOR MUSCLE SPASMS. 30 tablet 0   vitamin B-12 (CYANOCOBALAMIN) 1000 MCG tablet Take 1,000 mcg by mouth daily.     No current facility-administered medications for this visit.     Allergies:   Contrast media [iodinated diagnostic agents] and Iohexol   Social History:  The patient  reports that she quit smoking about 29 years ago. Her smoking use included cigarettes. She has a 60.00 pack-year smoking history. She has never used smokeless tobacco. She reports previous alcohol use. She reports that she does not use drugs.   Family History:   family history includes Other in her father and mother.    Review of Systems: Review of Systems  Constitutional:  Positive for malaise/fatigue.  HENT: Negative.    Respiratory:  Positive for cough and shortness of breath.   Cardiovascular: Negative.   Gastrointestinal: Negative.   Musculoskeletal: Negative.        Diffuse leg  pain bilaterally  Neurological:  Positive for weakness.  Psychiatric/Behavioral: Negative.    All other systems reviewed and are negative.  PHYSICAL EXAM: VS:  BP 100/60 (BP Location: Left Arm, Patient Position: Sitting, Cuff Size: Normal)   Pulse 100   Ht 5\' 4"  (1.626 m)   Wt 98 lb (44.5 kg)   SpO2 90% Comment: 2 liters  BMI 16.82 kg/m  , BMI Body mass index is 16.82 kg/m. Constitutional:  oriented to person, place, and time. No distress. Very thin, wheelchair Hyperventilation today Hard of hearing HENT:  Head: Grossly normal Eyes:  no discharge. No scleral icterus.  Neck: No JVD, no carotid bruits  Cardiovascular: Regular rate and rhythm, no murmurs appreciated Pulmonary/Chest: Clear to auscultation bilaterally, no wheezes or rails Abdominal: Soft.  no distension.  no tenderness.  Musculoskeletal: Normal range of motion Neurological:  normal muscle tone. Coordination normal. No atrophy Skin: Skin warm and dry Psychiatric: normal affect, pleasant   Recent Labs: 02/03/2021: BUN 27; Creatinine, Ser 0.82; Hemoglobin 10.7; Platelets 236; Potassium 4.2; Sodium 138    Lipid Panel Lab Results  Component Value Date   CHOL 125 05/27/2017   HDL 54.70 05/27/2017   LDLCALC 60 05/27/2017   TRIG 52.0 05/27/2017      Wt Readings from Last 3 Encounters:  04/08/21 98 lb (44.5 kg)  02/03/21 90 lb (40.8 kg)  10/02/20 110 lb (49.9 kg)     ASSESSMENT AND PLAN:   Atherosclerosis of native coronary artery of native heart with angina pectoris with documented spasm ([place]) - Plan: EKG 12-Lead Very sedentary Atypical chest pain No further work-up at this time  Mixed hyperlipidemia Cholesterol is at goal on the current lipid regimen. No changes to the medications were made.  Nonrheumatic aortic valve stenosis Severe stenosis, TAVR prior echocardiogram June 2021  Memory loss/dementia Hard of hearing, losing tremendous weight Daughter takes care of her  Chronic cough Continues  to have chronic bronchitis, daughter monitoring Good days and bad days, today better than yesterday Less likely exacerbation of bronchitis  COPD exacerbation (HCC) Cough is chronic,  Thin sputum production  chronic issue nebulizers twice daily ("lives off it")   Total encounter time more than 25 minutes  Greater than 50% was spent in counseling and coordination of care with the patient    Signed, Esmond Plants, M.D., Ph.D. 04/08/2021  [place], [place]

## 2021-04-08 ENCOUNTER — Other Ambulatory Visit: Payer: Self-pay

## 2021-04-08 ENCOUNTER — Ambulatory Visit (INDEPENDENT_AMBULATORY_CARE_PROVIDER_SITE_OTHER): Payer: Medicare Other | Admitting: Cardiovascular Disease

## 2021-04-08 ENCOUNTER — Encounter: Payer: Self-pay | Admitting: Cardiovascular Disease

## 2021-04-08 VITALS — BP 100/60 | HR 100 | Ht 64.0 in | Wt 98.0 lb

## 2021-04-08 DIAGNOSIS — I5032 Chronic diastolic (congestive) heart failure: Secondary | ICD-10-CM

## 2021-04-08 DIAGNOSIS — Z952 Presence of prosthetic heart valve: Secondary | ICD-10-CM | POA: Diagnosis not present

## 2021-04-08 DIAGNOSIS — I350 Nonrheumatic aortic (valve) stenosis: Secondary | ICD-10-CM

## 2021-04-08 DIAGNOSIS — J449 Chronic obstructive pulmonary disease, unspecified: Secondary | ICD-10-CM

## 2021-04-08 DIAGNOSIS — I2699 Other pulmonary embolism without acute cor pulmonale: Secondary | ICD-10-CM

## 2021-04-08 DIAGNOSIS — D649 Anemia, unspecified: Secondary | ICD-10-CM | POA: Diagnosis not present

## 2021-04-08 NOTE — Patient Instructions (Signed)
Medication Instructions:  No changes  If you need a refill on your cardiac medications before your next appointment, please call your pharmacy.    Lab work: No new labs needed   If you have labs (blood work) drawn today and your tests are completely normal, you will receive your results only by: [place] (if you have MyChart) OR A paper copy in the mail If you have any lab test that is abnormal or we need to change your treatment, we will call you to review the results.   Testing/Procedures: No new testing needed   Follow-Up: At [facility], you and your health needs are our priority.  As part of our continuing mission to provide you with exceptional heart care, we have created designated Provider Care Teams.  These Care Teams include your primary Cardiologist (physician) and Advanced Practice Providers (APPs -  Physician Assistants and Nurse Practitioners) who all work together to provide you with the care you need, when you need it.  You will need a follow up appointment in 6 months, APP ok  Providers on your designated Care Team:   Murray Hodgkins, NP Christell Faith, PA-C Marrianne Mood, PA-C  Any Other Special Instructions Will Be Listed Below (If Applicable).  COVID-19 Vaccine Information can be found at: ShippingScam.co.uk For questions related to vaccine distribution or appointments, please email vaccine@[email or web address] or call [phone].

## 2021-04-11 ENCOUNTER — Other Ambulatory Visit: Payer: Self-pay | Admitting: Family Medicine

## 2021-04-11 DIAGNOSIS — F411 Generalized anxiety disorder: Secondary | ICD-10-CM

## 2021-04-11 DIAGNOSIS — G4700 Insomnia, unspecified: Secondary | ICD-10-CM

## 2021-04-12 NOTE — Telephone Encounter (Signed)
Last OV: 11/28/2020 Last refill: 11/28/2020  Disp: 60  R: 3 Future OV: 05/13/2021

## 2021-04-24 ENCOUNTER — Telehealth: Payer: Self-pay | Admitting: Family Medicine

## 2021-04-24 NOTE — Telephone Encounter (Signed)
Left message for patient to call back and schedule Medicare Annual Wellness Visit (AWV) either virtually or in office.   AWV-I PER [place] 10/27/09 please schedule at anytime with [facility] Nurse Health Advisor 1 or 2   This should be a 45 minute visit.

## 2021-05-10 ENCOUNTER — Telehealth (INDEPENDENT_AMBULATORY_CARE_PROVIDER_SITE_OTHER): Payer: Medicare Other | Admitting: Family Medicine

## 2021-05-10 ENCOUNTER — Encounter: Payer: Self-pay | Admitting: Family Medicine

## 2021-05-10 ENCOUNTER — Telehealth: Payer: Self-pay | Admitting: Pharmacist

## 2021-05-10 VITALS — Ht 64.0 in

## 2021-05-10 DIAGNOSIS — M5441 Lumbago with sciatica, right side: Secondary | ICD-10-CM

## 2021-05-10 DIAGNOSIS — M5442 Lumbago with sciatica, left side: Secondary | ICD-10-CM | POA: Diagnosis not present

## 2021-05-10 DIAGNOSIS — F411 Generalized anxiety disorder: Secondary | ICD-10-CM

## 2021-05-10 DIAGNOSIS — G4700 Insomnia, unspecified: Secondary | ICD-10-CM | POA: Diagnosis not present

## 2021-05-10 DIAGNOSIS — E785 Hyperlipidemia, unspecified: Secondary | ICD-10-CM | POA: Diagnosis not present

## 2021-05-10 MED ORDER — ALPRAZOLAM 0.5 MG PO TABS: 0.5000 mg | ORAL_TABLET | Freq: Two times a day (BID) | ORAL | 2 refills | Status: DC | PRN

## 2021-05-10 MED ORDER — HYDROCODONE-ACETAMINOPHEN 5-325 MG PO TABS: 0.5000 | ORAL_TABLET | Freq: Two times a day (BID) | ORAL | 0 refills | Status: DC | PRN

## 2021-05-10 NOTE — Chronic Care Management (AMB) (Signed)
    Chronic Care Management Pharmacy Assistant   Name: Jessica [name]  MRN: [id number] DOB: 1926/05/22  05/10/21 -  Called patient to remind of appointment with [name]) on (05/13/21 at 2:45pm via telephone.)  Please call daughters phone number [name].  Patient aware of appointment date, time, and type of appointment (either telephone or in person). Patient aware to have/bring all medications, supplements, blood pressure and/or blood sugar logs to visit.  Questions: Have you had any recent office visit or specialist visit outside of [facility]? No. Are there any concerns you would like to discuss during your office visit? [name] wants to know of her mother the patient should even have to worry about taking the Simvastatin anymore. Patient is not able to be active or eat solid foods and has someone with her 24/7. Patient does good to keep ensures down due to a metal rod pressing into the back of her neck.  Are you having any problems obtaining your medications? No issues at this time.  If patient has any PAP medications ask if they are having any problems getting their PAP medication or refill? No patient assistance at this time.   Notes: [facility] stated [facility] have no record of patients Simvastatin being filled. She stated their records only go back two years.  Care Gaps:  AWV -  message sent to [place] Zoster vaccines [facility]) - overdue  COVID - 19 vaccine - overdue since 01/24/20  Star Rating Drug:  Simvastatin 20mg  - [facility]  Any gaps in medications fill history? Yes.   [place]  Clinical Pharmacist Assistant [phone]

## 2021-05-10 NOTE — Progress Notes (Signed)
Virtual Visit via Telephone Note  I connected with Jessica [name]'s daughter on 05/10/21 at  3:00 PM EDT by telephone and verified that I am speaking with the correct person using two identifiers.   I discussed the limitations, risks, security and privacy concerns of performing an evaluation and management service by telephone and the availability of in person appointments. I also discussed with the patient that there may be a patient responsible charge related to this service. The patient expressed understanding and agreed to proceed.  Location patient: At the vet's office. Location provider: work or home office Participants present for the call: patient,daughter, provider Patient did not have a visit in the prior 7 days to address this/these issue(s). Chief Complaint  Patient presents with   Follow-up   History of Present Illness: Jessica [name] is a 87 yo female with hx of hearing loss,chronic respiratory failure/COPD,fibromyalgia,CHF,CAD,severe AS s/p TAVR in 03/2019, and anxiety following on some of her chronic medical problems.  Daughter is her care giver and provides history today. Last follow up on 11/28/20. She was evaluated in the ER on 02/03/21 because right arm abscess, I&D was performed and abx prescribed.Problem has resolved. COPD symptoms are stable and now following annually. She follows with pulmonologist. She has seen her cardiologist since her last visit.  HLD: She has not taken Simvastatin for about 2 years. She is on Aspirin 81 mg daily.  Lab Results  Component Value Date   CHOL 125 05/27/2017   HDL 54.70 05/27/2017   LDLCALC 60 05/27/2017   LDLDIRECT 150.0 06/12/2008   TRIG 52.0 05/27/2017   CHOLHDL 2 05/27/2017   Chronic back pain: Pain is severe at times, radiated to LE's. Exacerbated by prolonged standing/walking and alleviated by rest. Epidural injections did not help and she is not a surgical candidate. She uses a walker at home sometimes, she has a wheel  chair. No falls since her last visit.  She is on Hydrocodone-Acetaminophen 5-325 mg 1/2 tab bid prn.She does not take medication daily. Her daughter has not noted side effects. Hx of constipation , she takes OTC stool softener. She has bowel moves 2-3 times per week. Negative for abdominal pain,nausea,or vomiting.   Anxiety: She is on Lexapro 20 mg daily and Alprazolam 0.5 mg bid prn. Alprazolam helps her asleep. She take 1/2-1 tab during the day as needed. Trazodone did not help with sleep in the past.  She has been on medications for years and tolerating well. Anxiety is aggravated by health problems.  Observations/Objective: I do not appreciate any SOB. Patient reported vitals:Ht 5\' 4"  (1.626 m)   BMI 16.82 kg/m   Assessment and Plan:  Diagnoses and all orders for this visit:  Bilateral low back pain with bilateral sciatica, unspecified chronicity Stable. We reviewed some side effects of opioid meds and the risk of medication interaction with benzos. Low dose of hydrocodone-Acetaminophen is helping, so no changes. [place] controlled subs report reviewed. Med contract 01/2020, will renew next time in office. Fall precautions discussed.  -     HYDROcodone-acetaminophen (NORCO) 5-325 MG tablet; Take 0.5 tablets by mouth every 12 (twelve) hours as needed for severe pain.  Insomnia, unspecified type Alprazolam 0.5 mg at bedtime is helping, so continue it. Good sleep hygiene.  -     ALPRAZolam (XANAX) 0.5 MG tablet; Take 1 tablet (0.5 mg total) by mouth 2 (two) times daily as needed for anxiety.  Hyperlipidemia, unspecified hyperlipidemia type Continue non pharmacologic treatment.  Generalized anxiety disorder Stable. Continue  Lexapro 20 mg daily and Alprazolam 0.5 mg bid prn. Side effects of medications discussed.  -     ALPRAZolam (XANAX) 0.5 MG tablet; Take 1 tablet (0.5 mg total) by mouth 2 (two) times daily as needed for anxiety.  Follow Up Instructions:  Return in  about 4 months (around 09/10/2021) for anxiety and pain.  I did not refer this patient for an OV in the next 24 hours for this/these issue(s).  I discussed the assessment and treatment plan with the patient's daughter. They were provided an opportunity to ask questions and all were answered. They agreed with the plan and demonstrated an understanding of the instructions.  I provided 12 minutes of non-face-to-face time during this encounter.   Itzell Bendavid [name], MD

## 2021-05-11 ENCOUNTER — Encounter: Payer: Self-pay | Admitting: Family Medicine

## 2021-05-13 ENCOUNTER — Ambulatory Visit (INDEPENDENT_AMBULATORY_CARE_PROVIDER_SITE_OTHER): Payer: Medicare Other | Admitting: Pharmacist

## 2021-05-13 DIAGNOSIS — J449 Chronic obstructive pulmonary disease, unspecified: Secondary | ICD-10-CM

## 2021-05-13 DIAGNOSIS — J9610 Chronic respiratory failure, unspecified whether with hypoxia or hypercapnia: Secondary | ICD-10-CM

## 2021-05-13 NOTE — Progress Notes (Signed)
Chronic Care Management Pharmacy Note  05/26/2021 Name:  Jessica [name] MRN:  [id number] DOB:  1926/09/05  Summary: Pt is not checking BP or weighing herself at home  Recommendations/Changes made from today's visit: -Recommended daily weights to note changes from fluid build up  Plan: Follow up in 6 months   Subjective: Jessica [name] is an 87 y.o. year old female who is a primary patient of [name], Malka So, MD.  The CCM team was consulted for assistance with disease management and care coordination needs.    Engaged with patient by telephone for follow up visit in response to provider referral for pharmacy case management and/or care coordination services.   Consent to Services:  The patient was given information about Chronic Care Management services, agreed to services, and gave verbal consent prior to initiation of services.  Please see initial visit note for detailed documentation.   Patient Care Team: [name], Jessica G, MD as PCP - General (Family Medicine) [name] November, MD as PCP - Cardiology (Cardiology) [name], MD as Consulting Physician (Cardiology) [name], [facility] as Pharmacist (Pharmacist)  Recent office visits: 05/10/21 Jessica [name], MD: Patient presented for video visit for back pain. No medication changes.  11/28/20 Jessica [name] MD (PCP) - video visit for insomnia and other chronic conditions. No medication changes. Follow up in 4 months.   Recent consult visits: 04/08/21 Jessica Rogue, MD (cardiology): Patient presented for CHF follow up. No medication changes.  10/02/20 Jessica Rogue MD (Cardiology) - seen for follow up TAVR and other issues. Discontinued furosemide 50m, gabapentin 1090mand prednisone 1083mFollow up in 6 months.  Hospital visits: Medication Reconciliation was completed by comparing discharge summary, patient's EMR and Pharmacy list, and upon discussion with patient.   [facility] and respiratory failure. [name]   [facility] -started Cephalexin 500m61mice a day due to Abscess.   Medication Changes at Hospital Discharge: None.    Medications Discontinued at Hospital Discharge: None.    Medications that remain the same after Hospital Discharge:?? -All other medications will remain the same.       Objective:  Lab Results  Component Value Date   CREATININE 0.82 02/03/2021   BUN 27 (H) 02/03/2021   GFR 47.06 (L) 12/21/2018   GFRNONAA >60 02/03/2021   GFRAA 86 06/19/2020   NA 138 02/03/2021   K 4.2 02/03/2021   CALCIUM 9.7 02/03/2021   CO2 32 02/03/2021   GLUCOSE 88 02/03/2021    Lab Results  Component Value Date/Time   HGBA1C 5.3 03/25/2019 10:08 AM   HGBA1C 5.4 04/08/2016 09:38 AM   GFR 47.06 (L) 12/21/2018 03:36 PM   GFR 88.01 05/27/2017 10:52 AM    Last diabetic Eye exam: No results found for: HMDIABEYEEXA  Last diabetic Foot exam: No results found for: HMDIABFOOTEX   Lab Results  Component Value Date   CHOL 125 05/27/2017   HDL 54.70 05/27/2017   LDLCALC 60 05/27/2017   LDLDIRECT 150.0 06/12/2008   TRIG 52.0 05/27/2017   CHOLHDL 2 05/27/2017    Hepatic Function Latest Ref Rng & Units 09/22/2019 09/21/2019 03/25/2019  Total Protein 6.5 - 8.1 g/dL 5.3(L) 5.4(L) 6.0(L)  Albumin 3.5 - 5.0 g/dL 2.6(L) 2.5(L) 3.2(L)  AST 15 - 41 U/L  _0 ALT 0 - 44 U/L _1 Alk Phosphatase 38 - 126 U/L 94 88 96  Total Bilirubin 0.3 - 1.2 mg/dL 0.8 0.9 0.7  Bilirubin, Direct 0.0 - 0.3 mg/dL - - -    Lab Results  Component Value Date/Time   TSH 1.67 01/18/2014 11:24 AM   TSH 1.04 01/13/2013 10:25 AM    CBC Latest Ref Rng & Units 02/03/2021 10/05/2019 10/05/2019  WBC 4.0 - 10.5 K/uL 10.2 - 4.7  Hemoglobin 12.0 - 15.0 g/dL 10.7(L) 12.6 11.7(L)  Hematocrit  36.0 - 46.0 % 34.6(L) 37.0 37.3  Platelets 150 - 400 K/uL 236 - 359    Lab Results  Component Value Date/Time   VD25OH 11.22 (L) 09/19/2019 02:17 AM   VD25OH 43.52 03/11/2016 10:48 AM   VD25OH 47 01/18/2014 11:24 AM    Clinical ASCVD: Yes  The ASCVD Risk score Jessica Bussing DC Jr., et al., 2013) failed to calculate for the following reasons:   The 2013 ASCVD risk score is only valid for ages 2 to 52    Depression screen PHQ 2/9 05/10/2021 06/23/2020 08/10/2019  Decreased Interest 0 0 1  Down, Depressed, Hopeless 1 1 0  PHQ - 2 Score _2 Altered sleeping 2 0 -  Tired, decreased energy 2 3 -  Change in appetite 1 1 -  Feeling bad or failure about yourself  0 0 -  Trouble concentrating 1 0 -  Moving slowly or fidgety/restless 0 0 -  Suicidal thoughts 0 0 -  PHQ-9 Score 7 5 -  Difficult doing work/chores - Somewhat difficult -  Some recent data might be hidden      Social History   Tobacco Use  Smoking Status Former   Packs/day: 2.00   Years: 30.00   Pack years: 60.00   Types: Cigarettes   Quit date: 10/28/1991   Years since quitting: 29.5  Smokeless Tobacco Never   BP Readings from Last 3 Encounters:  04/08/21 100/60  02/03/21 110/69  10/02/20 104/64   Pulse Readings from Last 3 Encounters:  04/08/21 100  02/03/21 (!) 103  10/02/20 86   Wt Readings from Last 3 Encounters:  04/08/21 98 lb (44.5 kg)  02/03/21 90 lb (40.8 kg)  10/02/20 110 lb (49.9 kg)   BMI Readings from Last 3 Encounters:  05/10/21 16.82 kg/m  04/08/21 16.82 kg/m  02/03/21 15.45 kg/m    Assessment/Interventions: Review of patient past medical history, allergies, medications, health status, including review of consultants reports, laboratory and other test data, was performed as part of comprehensive evaluation and provision of chronic care management services.   SDOH:  (Social Determinants of Health) assessments and interventions performed: No  SDOH Screenings   Alcohol Screen: Not on  file  Depression (PHQ2-9): Medium Risk   PHQ-2 Score: 7  Financial Resource Strain: Not on file  Food Insecurity: Not on file  Housing: Not on file  Physical Activity: Not on file  Social Connections: Not on file  Stress: Not on file  Tobacco Use: Medium Risk   Smoking Tobacco Use: Former   Smokeless Tobacco Use: Never  Transportation Needs: Not on file    [place]  Allergies  Allergen Reactions   Contrast Media [Iodinated Diagnostic Agents] Itching, Rash and Other (See Comments)    Hypotension, Skin turns red like a sunburn   Iohexol Itching, Rash and Other (See Comments)    Hypotension, Skin turns red like a sunburn  Medications Reviewed Today     Reviewed by [name], Jessica G, MD (Physician) on 05/11/21 at [place] List Status: <None>   Medication Order Taking? Sig Documenting Provider Last Dose Status Informant  Discontinued 05/10/21 1610   albuterol (PROVENTIL) (2.5 MG/3ML) 0.083% nebulizer solution [id number] Yes USE 1 VIAL IN NEBULIZER 6 TIMES DAILY - (for rescue) (Max 30 doses per month) Kasa, Maretta Bees, MD Taking Active   albuterol (VENTOLIN HFA) 108 (90 Base) MCG/ACT inhaler [id number] Yes INHALE 2 PUFFS BY MOUTH EVERY 6 HOURS AS NEEDED FOR WHEEZING Kasa, Kurian, MD Taking Active   ALPRAZolam Duanne Moron) 0.5 MG tablet [id number]  Take 1 tablet (0.5 mg total) by mouth 2 (two) times daily as needed for anxiety. [name], Jessica G, MD  Active   Camillo Flaming MEDICATION [id number] Yes Medication Name: Incentive spirometer Use 10-15 times per day Flora Lipps, MD Taking Active Other  aspirin EC 81 MG tablet [id number] Yes Take 1 tablet (81 mg total) by mouth daily. Eileen Stanford, PA-C Taking Active Child  BROVANA 15 MCG/2ML NEBU [id number] Yes USE 1 VIAL  IN  NEBULIZER TWICE  DAILY - morning and evening Flora Lipps, MD Taking Active   budesonide (PULMICORT) 0.5 MG/2ML nebulizer solution [id number] Yes USE 1 VIAL  IN  NEBULIZER TWICE  DAILY - Rinse mouth after  treatment Flora Lipps, MD Taking Active   calcium-vitamin D (OSCAL WITH D) 500-200 MG-UNIT tablet [id number] Yes Take 2 tablets by mouth daily with breakfast. Mercy Riding, MD Taking Active   escitalopram (LEXAPRO) 20 MG tablet [id number] Yes TAKE 1 TABLET BY MOUTH EVERY DAY [name], Jessica G, MD Taking Active   fluticasone (FLONASE) 50 MCG/ACT nasal spray [id number] Yes Place 2 sprays into both nostrils daily. Jonetta Osgood, MD Taking Active Child  HYDROcodone-acetaminophen (NORCO) 5-325 MG tablet [id number]  Take 0.5 tablets by mouth every 12 (twelve) hours as needed for severe pain. [name], Jessica G, MD  Active   loratadine (CLARITIN) 10 MG tablet [id number] Yes Take 1 tablet (10 mg total) by mouth daily. Ghimire, Henreitta Leber, MD Taking Active Other  polyethylene glycol (MIRALAX / GLYCOLAX) 17 g packet [id number] Yes Take 17 g by mouth daily as needed for mild constipation. Mercy Riding, MD Taking Active   Respiratory Therapy Supplies (FLUTTER) DEVI [id number] Yes 1 each by Does not apply route daily. Flora Lipps, MD Taking Active Other  senna-docusate (SENOKOT-S) 8.6-50 MG tablet [id number] Yes Take 2 tablets by mouth 2 (two) times daily as needed for mild constipation or moderate constipation. Mercy Riding, MD Taking Active   Discontinued 05/10/21 1604 tiZANidine (ZANAFLEX) 2 MG tablet [id number] Yes TAKE 0.5-1 TABLETS (1-2 MG TOTAL) BY MOUTH AT BEDTIME AS NEEDED FOR MUSCLE SPASMS. [name], Jessica G, MD Taking Active   vitamin B-12 (CYANOCOBALAMIN) 1000 MCG tablet [id number] Yes Take 1,000 mcg by mouth daily. [provider] Taking Active             Patient Active Problem List   Diagnosis Date Noted   Pressure injury of skin 09/19/2019   Acute right hip pain 09/17/2019   S/P TAVR (transcatheter aortic valve replacement)    Chronic respiratory failure ([place]) 12/15/2018   Depression 12/15/2018   CAD (coronary artery disease) 12/15/2018   Chronic diastolic CHF (congestive heart failure)  ([place]) 12/15/2018   History of pulmonary embolism 08/25/2018   Severe aortic stenosis    Aortic atherosclerosis ([place]) 05/20/2018   Insomnia 04/08/2016   Chronic obstructive pulmonary disease ([place]) 05/07/2015  Labial cyst 05/07/2015   Exertional dyspnea 02/26/2015   Vitamin D deficiency 01/14/2009   Hyperlipidemia 01/14/2009   DEGENERATIVE JOINT DISEASE 06/12/2008   Fibromyalgia 12/15/2007   Generalized anxiety disorder 12/14/2007    Immunization History  Administered Date(s) Administered   Fluad Quad(high Dose 65+) 08/10/2019, 08/31/2020   Influenza Split 07/17/2011, 08/09/2012, 07/21/2013   Influenza Whole 08/16/2008, 09/12/2009, 07/18/2010   Influenza, High Dose Seasonal PF 11/19/2016, 09/02/2017, 06/30/2018   Influenza,inj,Quad PF,6+ Mos 09/06/2014   Influenza-Unspecified 10/03/2015   PFIZER(Purple Top)SARS-COV-2 Vaccination 12/01/2019, 12/27/2019   Pneumococcal Conjugate-13 09/06/2014   Pneumococcal Polysaccharide-23 01/10/2009   Tdap 01/16/2013   Zoster, Live 07/21/2013    Conditions to be addressed/monitored:  Hyperlipidemia, Heart Failure, Coronary Artery Disease, COPD, Depression, Anxiety, Allergic Rhinitis, and Insomnia, Back pain  Conditions addressed this visit: Pain, COPD, heart failure  Care Plan : [place]  Updates made by [name], [place] since 05/26/2021 12:00 AM     Problem: Problem: Hyperlipidemia, Heart Failure, Coronary Artery Disease, COPD, Depression, Anxiety, Allergic Rhinitis, and Insomnia, Back pain      Long-Range Goal: Patient-Specific Goal   Start Date: 05/13/2021  Expected End Date: 05/13/2022  This Visit's Progress: On track  Priority: High  Note:   Current Barriers:  Unable to independently monitor therapeutic efficacy  Pharmacist Clinical Goal(s):  Patient will achieve adherence to monitoring guidelines and medication adherence to achieve therapeutic efficacy through collaboration with PharmD and provider.    Interventions: 1:1 collaboration with [name], Jessica G, MD regarding development and update of comprehensive plan of care as evidenced by provider attestation and co-signature Inter-disciplinary care team collaboration (see longitudinal plan of care) Comprehensive medication review performed; medication list updated in electronic medical record  Hyperlipidemia: (LDL goal < 70) -Uncontrolled -Current treatment: No medications -Medications previously tried: simvastatin (no longer needed)  -Current dietary patterns: did not discuss -Current exercise habits: did not discuss -Educated on Cholesterol goals;  Importance of limiting foods high in cholesterol; -Counseled on diet and exercise extensively  CAD (Goal: prevent heart events) -Controlled -Current treatment  Aspirin 81 mg 1 tablet daily -Medications previously tried: none  -Recommended to continue current medication   Heart Failure (Goal: manage symptoms and prevent exacerbations) -Not ideally controlled -Last ejection fraction: 60-65% (Date: 05/11/2019) -HF type: Diastolic -NYHA Class: III (marked limitation of activity) -AHA HF Stage: C (Heart disease and symptoms present) -Current treatment: Furosemide 20 mg 1 tablet daily as needed -Medications previously tried: n/a  -Current home BP/HR readings: not monitoring regularly -Current dietary habits: limiting salt intake -Current exercise habits: not able to do exercise -Educated on Importance of weighing daily; if you gain more than 3 pounds in one day or 5 pounds in one week, call cardiology -Recommended to continue current medication  COPD (Goal: control symptoms and prevent exacerbations) -Controlled -Current treatment  Albuterol 0.083% nebulizer solution, use 1 vial in nebulizer six times daily Albuterol (Ventolin HFA) 139mg/ act inhaler, inhale 2 puffs every six hours as needed for wheezing Brovana 147m/2 mL nebulizer solution, use 1 vial in nebulizer twice  daily Budesonide 0.79m17m2 ml, use 1 vial twice daily (after Brovana) -Medications previously tried: Advair  -Gold Grade: Gold 2 (FEV1 50-79%) -Current COPD Classification:  B (high sx, <2 exacerbations/yr) -MMRC/CAT score: n/a -Pulmonary function testing: 07/26/2018 -Exacerbations requiring treatment in last 6 months: none -Patient reports consistent use of maintenance inhaler -Frequency of rescue inhaler use: 2-3 times daily -Counseled on Benefits of consistent maintenance inhaler use Differences between maintenance  and rescue inhalers -Recommended to continue current medication  Depression/Anxiety (Goal: minimize symptoms) -Controlled -Current treatment: Escitalopram (Lexapro) 18m, 1 tablet once daily Alprazolam 0.541m 1 tablet twice daily as needed for anxiety (taking every night; and additional dose in the day if needed) -Medications previously tried/failed: n/a -PHQ9: 7 -GAD7: none -Educated on Benefits of medication for symptom control -Recommended to continue current medication  Allergic rhinitis (Goal: minimize symptoms) -Controlled -Current treatment  Fluticasone (Flonase) 5042mact nasal spray, 2 sprays into both nostrils daily  Claritin 10 mg 1 tablet daily -Medications previously tried: none  -Recommended to continue current medication  Back pain (Goal: minimize pain) -Not ideally controlled -Current treatment  Hydrocodone/APAP 5/325m47m.5 tablet every twelve hours as needed for severe pain Tizanidine 2mg,70m5 to 1 tablet at bedtime as needed for muscle spasms  Acetaminophen 500mg,8mablets every eight hours as needed for mild pain, moderate pain or headache -Medications previously tried: n/a  -Counseled on separating Xanax from hydrocodone to avoid sedation/respiratory depression.    Health Maintenance -Vaccine gaps:  COVID booster, shingrix -Current therapy:  Miralax,17 gram daily as needed for mild constipation (currently not giving to her)  Senna-  docusate (Senokot) 8.6-50mg, 2 tablets twice daily as needed for mild constipation or moderate constipation  Vitamin B12 1000 mcg 1 tablet daily -Educated on Cost vs benefit of each product must be carefully weighed by individual consumer -Patient is satisfied with current therapy and denies issues -Recommended to continue current medication  Patient Goals/Self-Care Activities Patient will:  - take medications as prescribed check blood pressure weekly, document, and provide at future appointments weigh daily, and contact provider if weight gain of > 3 lbs in one day or > 5 lbs in one week  Follow Up Plan: Telephone follow up appointment with care management team member scheduled for: 6 months       Medication Assistance: None required.  Patient affirms current coverage meets needs.  Compliance/Adherence/Medication fill history: Care Gaps: Shingrix, COVID booster  Star-Rating Drugs: None  Patient's preferred pharmacy is:  [facility] [address] [place] [address]: [phone] [facility] [name] [address]: [phone] [address] [address]: [phone] pill box? No - worried about neighbors Pt endorses 99% compliance  We discussed: Current pharmacy is preferred with insurance plan and patient is satisfied with pharmacy services Patient decided to: Continue current medication management strategy  Care Plan and Follow Up Patient Decision:  Patient agrees to Care Plan and Follow-up.  Plan: Telephone follow up appointment with care management team member scheduled for:  6 months  MadeliJeni SallesmD, BCACP [facility] [phone]

## 2021-05-14 ENCOUNTER — Telehealth: Payer: Self-pay | Admitting: Internal Medicine

## 2021-05-15 MED ORDER — AZITHROMYCIN 250 MG PO TABS: ORAL_TABLET | ORAL | 0 refills | Status: AC

## 2021-05-15 MED ORDER — PREDNISONE 10 MG (21) PO TBPK: ORAL_TABLET | ORAL | 21 refills | Status: DC

## 2021-05-15 NOTE — Telephone Encounter (Signed)
Lm for patient.  

## 2021-05-15 NOTE — Telephone Encounter (Signed)
Spoke to patient's daughter, [name](DPR). [name] is requesting a Rx for zpak and prednisone.  Patient is experiencing chest congestion, prod cough with clear sputum and mild wheezing x2d.  Sob is baseline Denied fever, chills or sweats.  Using albuterol HFA TID, albuterol solution QID, pulmicort BID and Brovana BID.  Not using OTC meds to help with sx. She wears 2.5L cont. Spo2 is maintaining around 92%.  Dr. Patsey Berthold, please advise. Dr. Mortimer Fries is unavailable.

## 2021-05-15 NOTE — Telephone Encounter (Signed)
Patient's daughter, [name](DPR) is aware of recommendations and voiced her understanding. Zpak and prednisone has been sent to preferred pharmacy.  OV scheduled for 07/04/2021 at 2:30. Patient recently seen cardiology and will f/u in 1y. Nothing further needed at this time.

## 2021-05-15 NOTE — Telephone Encounter (Signed)
Okay to send in for prednisone taper and azithromycin.  Continue to monitor closely.  She is past due for follow-up was supposed to follow-up in 6 months from her last visit of November 2021.  Schedule with Dr. Mortimer Fries or nurse practitioner for follow-up.  Also make sure she has cardiology follow-up as she has significant cardiac issues.

## 2021-06-03 ENCOUNTER — Telehealth: Payer: Self-pay

## 2021-06-03 NOTE — Telephone Encounter (Signed)
Called patient x3 with no answer and no voice mail  Patient may reschedule next available appointment.  L.Layne Lebon,LPN

## 2021-06-03 NOTE — Progress Notes (Deleted)
Subjective:   Jessica [name] is a 87 y.o. female who presents for an Initial Medicare Annual Wellness Visit.  I connected with Noel Gerold today by telephone and verified that I am speaking with the correct person using two identifiers. Location patient: home Location provider: work Persons participating in the virtual visit: patient, provider.   I discussed the limitations, risks, security and privacy concerns of performing an evaluation and management service by telephone and the availability of in person appointments. I also discussed with the patient that there may be a patient responsible charge related to this service. The patient expressed understanding and verbally consented to this telephonic visit.    Interactive audio and video telecommunications were attempted between this provider and patient, however failed, due to patient having technical difficulties OR patient did not have access to video capability.  We continued and completed visit with audio only.    Review of Systems    N/a       Objective:    There were no vitals filed for this visit. There is no height or weight on file to calculate BMI.  Advanced Directives 02/03/2021 10/05/2019 09/19/2019 09/17/2019 09/17/2019 03/25/2019 12/29/2018  Does Patient Have a Medical Advance Directive? Unable to assess, patient is non-responsive or altered mental status Yes Yes Yes Yes Yes Yes  Type of Advance Directive - Healthcare Power of [place];Living will  Does patient want to make changes to medical advance directive? - No - Patient declined No - Patient declined No - Patient declined Yes (ED - Information included in AVS) No - Patient declined No - Patient declined  Copy of [place] in Chart? - - - - - Yes - validated most recent copy scanned in chart (See row information) No - copy requested     Current Medications (verified) Outpatient Encounter Medications as of 06/03/2021  Medication Sig   predniSONE (STERAPRED UNI-PAK 21 TAB) 10 MG (21) TBPK tablet Use as directed.   albuterol (PROVENTIL) (2.5 MG/3ML) 0.083% nebulizer solution USE 1 VIAL IN NEBULIZER 6 TIMES DAILY - (for rescue) (Max 30 doses per month)   albuterol (VENTOLIN HFA) 108 (90 Base) MCG/ACT inhaler INHALE 2 PUFFS BY MOUTH EVERY 6 HOURS AS NEEDED FOR WHEEZING   ALPRAZolam (XANAX) 0.5 MG tablet Take 1 tablet (0.5 mg total) by mouth 2 (two) times daily as needed for anxiety.   AMBULATORY NON FORMULARY MEDICATION Medication Name: Incentive spirometer Use 10-15 times per day   aspirin EC 81 MG tablet Take 1 tablet (81 mg total) by mouth daily.   BROVANA 15 MCG/2ML NEBU USE 1 VIAL  IN  NEBULIZER TWICE  DAILY - morning and evening   budesonide (PULMICORT) 0.5 MG/2ML nebulizer solution USE 1 VIAL  IN  NEBULIZER TWICE  DAILY - Rinse mouth after treatment   escitalopram (LEXAPRO) 20 MG tablet TAKE 1 TABLET BY MOUTH EVERY DAY   fluticasone (FLONASE) 50 MCG/ACT nasal spray Place 2 sprays into both nostrils daily.   HYDROcodone-acetaminophen (NORCO) 5-325 MG tablet Take 0.5 tablets by mouth every 12 (twelve) hours as needed for severe pain.   Respiratory Therapy Supplies (FLUTTER) DEVI 1 each by Does not apply route daily.   senna-docusate (SENOKOT-S) 8.6-50 MG tablet Take 2 tablets by mouth 2 (two) times daily as needed for mild constipation or moderate constipation.   tiZANidine (ZANAFLEX) 2 MG tablet TAKE 0.5-1 TABLETS (1-2 MG TOTAL) BY  MOUTH AT BEDTIME AS NEEDED FOR MUSCLE SPASMS.   vitamin B-12 (CYANOCOBALAMIN) 1000 MCG tablet Take 1,000 mcg by mouth daily.   No facility-administered encounter medications on file as of 06/03/2021.    Allergies (verified) Contrast media [iodinated diagnostic agents] and Iohexol   History: Past Medical History:  Diagnosis Date   Anxiety    congenital nystagmus    COPD (chronic  obstructive pulmonary disease) (HCC)    Coronary artery disease    DJD (degenerative joint disease)    Fibromyalgia    Low back pain syndrome    Memory loss    Other and unspecified hyperlipidemia    Pancreatic lesion    Pneumonia    S/P TAVR (transcatheter aortic valve replacement)    26 mm Edwards Sapien 3 via the TF approach   Severe aortic stenosis    Venous insufficiency    Past Surgical History:  Procedure Laterality Date   ABDOMINAL HYSTERECTOMY     ANTERIOR CERVICAL DISCECTOMY     APPENDECTOMY     CARPAL TUNNEL RELEASE  12/2011   right arm   CATARACT EXTRACTION     CORONARY ATHERECTOMY  07/23/2018   PTCA/orbital atherectomy/DES x 1 proximal to mid LAD   CORONARY ATHERECTOMY N/A 07/23/2018   Procedure: CORONARY ATHERECTOMY;  Surgeon: Burnell Blanks, MD;  Location: [facility];  Service: Cardiovascular;  Laterality: N/A;   CORONARY STENT INTERVENTION     CORONARY STENT INTERVENTION N/A 07/23/2018   Procedure: CORONARY STENT INTERVENTION;  Surgeon: Burnell Blanks, MD;  Location: [facility];  Service: Cardiovascular;  Laterality: N/A;   EYE SURGERY     INTRAMEDULLARY (IM) NAIL INTERTROCHANTERIC Right 09/19/2019   Procedure: INTRAMEDULLARY (IM) NAIL INTERTROCHANTRIC;  Surgeon: Renette Butters, MD;  Location: [facility];  Service: Orthopedics;  Laterality: Right;   LUMBAR LAMINECTOMY     right knee arthroscopy     right shoulder replacement  04/2009   right shoulder surgery  2008   Dr. Noemi Chapel   RIGHT/LEFT HEART CATH AND CORONARY ANGIOGRAPHY N/A 07/07/2018   Procedure: RIGHT/LEFT HEART CATH AND CORONARY ANGIOGRAPHY;  Surgeon: Burnell Blanks, MD;  Location: [facility];  Service: Cardiovascular;  Laterality: N/A;   TEE WITHOUT CARDIOVERSION N/A 03/29/2019   Procedure: TRANSESOPHAGEAL ECHOCARDIOGRAM (TEE);  Surgeon: Burnell Blanks, MD;  Location: [facility];  Service: Open Heart Surgery;  Laterality: N/A;   TRANSCATHETER AORTIC VALVE  REPLACEMENT, TRANSFEMORAL N/A 03/29/2019   Procedure: TRANSCATHETER AORTIC VALVE REPLACEMENT, TRANSFEMORAL;  Surgeon: Burnell Blanks, MD;  Location: [facility];  Service: Open Heart Surgery;  Laterality: N/A;   ULTRASOUND GUIDANCE FOR VASCULAR ACCESS  07/07/2018   Procedure: Ultrasound Guidance For Vascular Access;  Surgeon: Burnell Blanks, MD;  Location: [facility];  Service: Cardiovascular;;   Family History  Problem Relation Age of Onset   Other Mother        broken hip   Other Father        kidney problems   Colon cancer Neg Hx    Stomach cancer Neg Hx    Rectal cancer Neg Hx    Pancreatic cancer Neg Hx    Social History   Socioeconomic History   Marital status: Widowed    Spouse name: Not on file   Number of children: 2   Years of education: Not on file   Highest education level: Not on file  Occupational History   Occupation: Still works,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  Tobacco Use  Smoking status: Former    Packs/day: 2.00    Years: 30.00    Pack years: 60.00    Types: Cigarettes    Quit date: 10/28/1991    Years since quitting: 29.6   Smokeless tobacco: Never  Vaping Use   Vaping Use: Never used  Substance and Sexual Activity   Alcohol use: Not Currently    Comment: 1 drink per night   Drug use: No   Sexual activity: Not Currently  Other Topics Concern   Not on file  Social History Narrative   9 siblings   Social Determinants of Health   Financial Resource Strain: Not on file  Food Insecurity: Not on file  Transportation Needs: Not on file  Physical Activity: Not on file  Stress: Not on file  Social Connections: Not on file    Tobacco Counseling Counseling given: Not Answered   Clinical Intake:                 Diabetic?no         Activities of Daily Living No flowsheet data found.  Patient Care Team: [name], [name], MD as PCP - General (Family Medicine) [name] November, MD as PCP - Cardiology (Cardiology) [name], MD as Consulting Physician (Cardiology) [name], [facility] as Pharmacist (Pharmacist)  Indicate any recent Medical Services you may have received from other than [facility] providers in the past year (date may be approximate).     Assessment:   This is a routine wellness examination for [place].  Hearing/Vision screen No results found.  Dietary issues and exercise activities discussed:     Goals Addressed   None    Depression Screen PHQ 2/9 Scores 05/10/2021 06/23/2020 08/10/2019 06/30/2018 04/08/2016 01/24/2015 01/13/2013  PHQ - 2 Score '1 1 1 '$ 0 0 0 0  PHQ- 9 Score 7 5 - - - - -    Fall Risk Fall Risk  05/11/2021 06/23/2020 04/08/2016 01/24/2015 01/13/2013  Falls in the past year? 0 0 No No No  Number falls in past yr: 0 0 - - -  Injury with Fall? 0 0 - - -  Risk for fall due to : - Impaired balance/gait;Medication side effect;Orthopedic patient - - -  Follow up Education provided Education provided - - -    FALL RISK PREVENTION PERTAINING TO THE HOME:  Any stairs in or around the home? {YES/NO:21197} If so, are there any without handrails? {YES/NO:21197} Home free of loose throw rugs in walkways, pet beds, electrical cords, etc? {YES/NO:21197} Adequate lighting in your home to reduce risk of falls? {YES/NO:21197}  ASSISTIVE DEVICES UTILIZED TO PREVENT FALLS:  Life alert? {YES/NO:21197} Use of a cane, walker or w/c? {YES/NO:21197} Grab bars in the bathroom? {YES/NO:21197} Shower chair or bench in shower? {YES/NO:21197} Elevated toilet seat or a handicapped toilet? {YES/NO:21197}  TIMED UP AND GO:  Was the test performed? {YES/NO:21197}.  Length of time to ambulate 10 feet: *** sec.   {Appearance of GA:4730917  Cognitive Function:        Immunizations Immunization History  Administered Date(s) Administered   Fluad Quad(high Dose 65+) 08/10/2019, 08/31/2020   Influenza Split 07/17/2011, 08/09/2012, 07/21/2013   Influenza Whole 08/16/2008, 09/12/2009,  07/18/2010   Influenza, High Dose Seasonal PF 11/19/2016, 09/02/2017, 06/30/2018   Influenza,inj,Quad PF,6+ Mos 09/06/2014   Influenza-Unspecified 10/03/2015   PFIZER(Purple Top)SARS-COV-2 Vaccination 12/01/2019, 12/27/2019   Pneumococcal Conjugate-13 09/06/2014   Pneumococcal Polysaccharide-23 01/10/2009   Tdap 01/16/2013   Zoster, Live 07/21/2013    {  TDAP status:2101805}  {Flu Vaccine status:2101806}  {Pneumococcal vaccine status:2101807}  {Covid-19 vaccine status:2101808}  Qualifies for Shingles Vaccine? {YES/NO:21197}  Zostavax completed {YES/NO:21197}  {Shingrix Completed?:2101804}  Screening Tests Health Maintenance  Topic Date Due   COVID-19 Vaccine (3 - Pfizer risk series) 01/24/2020   INFLUENZA VACCINE  05/27/2021   TETANUS/TDAP  01/17/2023   PNA vac Low Risk Adult  Completed   HPV VACCINES  Aged Out   DEXA SCAN  Discontinued   Zoster Vaccines- Shingrix  Discontinued    Health Maintenance  Health Maintenance Due  Topic Date Due   COVID-19 Vaccine (3 - Pfizer risk series) 01/24/2020   INFLUENZA VACCINE  05/27/2021    {Colorectal cancer screening:2101809}  {Mammogram status:21018020}  {Bone Density status:21018021}  Lung Cancer Screening: (Low Dose CT Chest recommended if Age 40-80 years, 30 pack-year currently smoking OR have quit w/in 15years.) {DOES NOT does:27190::"does not"} qualify.   Lung Cancer Screening Referral: ***  Additional Screening:  Hepatitis C Screening: {DOES NOT does:27190::"does not"} qualify; Completed ***  Vision Screening: Recommended annual ophthalmology exams for early detection of glaucoma and other disorders of the eye. Is the patient up to date with their annual eye exam?  {YES/NO:21197} Who is the provider or what is the name of the office in which the patient attends annual eye exams? *** If pt is not established with a provider, would they like to be referred to a provider to establish care? {YES/NO:21197}.   Dental  Screening: Recommended annual dental exams for proper oral hygiene  Community Resource Referral / Chronic Care Management: CRR required this visit?  {YES/NO:21197}  CCM required this visit?  {YES/NO:21197}     Plan:     I have personally reviewed and noted the following in the patient's chart:   Medical and social history Use of alcohol, tobacco or illicit drugs  Current medications and supplements including opioid prescriptions. {Opioid [phone]} Functional ability and status Nutritional status Physical activity Advanced directives List of other physicians Hospitalizations, surgeries, and ER visits in previous 12 months Vitals Screenings to include cognitive, depression, and falls Referrals and appointments  In addition, I have reviewed and discussed with patient certain preventive protocols, quality metrics, and best practice recommendations. A written personalized care plan for preventive services as well as general preventive health recommendations were provided to patient.     Randel Pigg, LPN   [id number]   Nurse Notes: ***

## 2021-06-04 NOTE — Telephone Encounter (Signed)
Documented on spreadsheet 

## 2021-06-21 ENCOUNTER — Inpatient Hospital Stay (HOSPITAL_COMMUNITY)
Admission: EM | Admit: 2021-06-21 | Discharge: 2021-07-02 | DRG: 871 | Disposition: A | Discharge status: Hospice | Payer: Medicare Other | Attending: Family Medicine | Admitting: Family Medicine

## 2021-06-21 ENCOUNTER — Emergency Department (HOSPITAL_COMMUNITY): Payer: Medicare Other

## 2021-06-21 ENCOUNTER — Encounter (HOSPITAL_COMMUNITY): Payer: Self-pay | Admitting: Internal Medicine

## 2021-06-21 DIAGNOSIS — Z66 Do not resuscitate: Secondary | ICD-10-CM | POA: Diagnosis present

## 2021-06-21 DIAGNOSIS — Z681 Body mass index (BMI) 19 or less, adult: Secondary | ICD-10-CM | POA: Diagnosis not present

## 2021-06-21 DIAGNOSIS — J9690 Respiratory failure, unspecified, unspecified whether with hypoxia or hypercapnia: Secondary | ICD-10-CM | POA: Diagnosis not present

## 2021-06-21 DIAGNOSIS — E872 Acidosis, unspecified: Secondary | ICD-10-CM | POA: Diagnosis present

## 2021-06-21 DIAGNOSIS — J90 Pleural effusion, not elsewhere classified: Secondary | ICD-10-CM | POA: Diagnosis not present

## 2021-06-21 DIAGNOSIS — A419 Sepsis, unspecified organism: Secondary | ICD-10-CM | POA: Diagnosis not present

## 2021-06-21 DIAGNOSIS — R001 Bradycardia, unspecified: Secondary | ICD-10-CM | POA: Diagnosis present

## 2021-06-21 DIAGNOSIS — J690 Pneumonitis due to inhalation of food and vomit: Secondary | ICD-10-CM | POA: Diagnosis not present

## 2021-06-21 DIAGNOSIS — Z515 Encounter for palliative care: Secondary | ICD-10-CM

## 2021-06-21 DIAGNOSIS — J439 Emphysema, unspecified: Secondary | ICD-10-CM | POA: Diagnosis present

## 2021-06-21 DIAGNOSIS — R531 Weakness: Secondary | ICD-10-CM | POA: Diagnosis not present

## 2021-06-21 DIAGNOSIS — F411 Generalized anxiety disorder: Secondary | ICD-10-CM | POA: Diagnosis present

## 2021-06-21 DIAGNOSIS — I2510 Atherosclerotic heart disease of native coronary artery without angina pectoris: Secondary | ICD-10-CM

## 2021-06-21 DIAGNOSIS — J159 Unspecified bacterial pneumonia: Secondary | ICD-10-CM | POA: Diagnosis present

## 2021-06-21 DIAGNOSIS — Z86711 Personal history of pulmonary embolism: Secondary | ICD-10-CM

## 2021-06-21 DIAGNOSIS — Z952 Presence of prosthetic heart valve: Secondary | ICD-10-CM

## 2021-06-21 DIAGNOSIS — U071 COVID-19: Secondary | ICD-10-CM | POA: Diagnosis not present

## 2021-06-21 DIAGNOSIS — Z96611 Presence of right artificial shoulder joint: Secondary | ICD-10-CM | POA: Diagnosis present

## 2021-06-21 DIAGNOSIS — R627 Adult failure to thrive: Secondary | ICD-10-CM | POA: Diagnosis not present

## 2021-06-21 DIAGNOSIS — J80 Acute respiratory distress syndrome: Secondary | ICD-10-CM | POA: Diagnosis not present

## 2021-06-21 DIAGNOSIS — I5032 Chronic diastolic (congestive) heart failure: Secondary | ICD-10-CM | POA: Diagnosis not present

## 2021-06-21 DIAGNOSIS — J1282 Pneumonia due to coronavirus disease 2019: Secondary | ICD-10-CM | POA: Diagnosis present

## 2021-06-21 DIAGNOSIS — J189 Pneumonia, unspecified organism: Secondary | ICD-10-CM | POA: Diagnosis not present

## 2021-06-21 DIAGNOSIS — R634 Abnormal weight loss: Secondary | ICD-10-CM | POA: Diagnosis present

## 2021-06-21 DIAGNOSIS — Z91041 Radiographic dye allergy status: Secondary | ICD-10-CM

## 2021-06-21 DIAGNOSIS — E782 Mixed hyperlipidemia: Secondary | ICD-10-CM | POA: Diagnosis not present

## 2021-06-21 DIAGNOSIS — R1310 Dysphagia, unspecified: Secondary | ICD-10-CM | POA: Diagnosis not present

## 2021-06-21 DIAGNOSIS — J9601 Acute respiratory failure with hypoxia: Secondary | ICD-10-CM | POA: Diagnosis not present

## 2021-06-21 DIAGNOSIS — R404 Transient alteration of awareness: Secondary | ICD-10-CM | POA: Diagnosis not present

## 2021-06-21 DIAGNOSIS — Z7951 Long term (current) use of inhaled steroids: Secondary | ICD-10-CM

## 2021-06-21 DIAGNOSIS — Z79899 Other long term (current) drug therapy: Secondary | ICD-10-CM | POA: Diagnosis not present

## 2021-06-21 DIAGNOSIS — R4182 Altered mental status, unspecified: Secondary | ICD-10-CM | POA: Diagnosis not present

## 2021-06-21 DIAGNOSIS — Z9981 Dependence on supplemental oxygen: Secondary | ICD-10-CM

## 2021-06-21 DIAGNOSIS — E860 Dehydration: Secondary | ICD-10-CM | POA: Diagnosis present

## 2021-06-21 DIAGNOSIS — I499 Cardiac arrhythmia, unspecified: Secondary | ICD-10-CM | POA: Diagnosis not present

## 2021-06-21 DIAGNOSIS — I491 Atrial premature depolarization: Secondary | ICD-10-CM | POA: Diagnosis not present

## 2021-06-21 DIAGNOSIS — R918 Other nonspecific abnormal finding of lung field: Secondary | ICD-10-CM | POA: Diagnosis not present

## 2021-06-21 DIAGNOSIS — G9341 Metabolic encephalopathy: Secondary | ICD-10-CM | POA: Diagnosis not present

## 2021-06-21 DIAGNOSIS — M958 Other specified acquired deformities of musculoskeletal system: Secondary | ICD-10-CM | POA: Diagnosis not present

## 2021-06-21 DIAGNOSIS — R069 Unspecified abnormalities of breathing: Secondary | ICD-10-CM | POA: Diagnosis not present

## 2021-06-21 DIAGNOSIS — R1312 Dysphagia, oropharyngeal phase: Secondary | ICD-10-CM | POA: Diagnosis present

## 2021-06-21 DIAGNOSIS — R64 Cachexia: Secondary | ICD-10-CM | POA: Diagnosis present

## 2021-06-21 DIAGNOSIS — I959 Hypotension, unspecified: Secondary | ICD-10-CM | POA: Diagnosis not present

## 2021-06-21 DIAGNOSIS — J9621 Acute and chronic respiratory failure with hypoxia: Secondary | ICD-10-CM

## 2021-06-21 DIAGNOSIS — R9431 Abnormal electrocardiogram [ECG] [EKG]: Secondary | ICD-10-CM | POA: Diagnosis not present

## 2021-06-21 DIAGNOSIS — J449 Chronic obstructive pulmonary disease, unspecified: Secondary | ICD-10-CM | POA: Diagnosis not present

## 2021-06-21 DIAGNOSIS — I509 Heart failure, unspecified: Secondary | ICD-10-CM | POA: Diagnosis not present

## 2021-06-21 DIAGNOSIS — R6520 Severe sepsis without septic shock: Secondary | ICD-10-CM | POA: Diagnosis not present

## 2021-06-21 DIAGNOSIS — Z7982 Long term (current) use of aspirin: Secondary | ICD-10-CM

## 2021-06-21 DIAGNOSIS — Z981 Arthrodesis status: Secondary | ICD-10-CM

## 2021-06-21 DIAGNOSIS — Z87891 Personal history of nicotine dependence: Secondary | ICD-10-CM

## 2021-06-21 DIAGNOSIS — G4700 Insomnia, unspecified: Secondary | ICD-10-CM

## 2021-06-21 DIAGNOSIS — R0600 Dyspnea, unspecified: Secondary | ICD-10-CM | POA: Diagnosis not present

## 2021-06-21 DIAGNOSIS — R000 Tachycardia, unspecified: Secondary | ICD-10-CM | POA: Diagnosis not present

## 2021-06-21 DIAGNOSIS — R0902 Hypoxemia: Secondary | ICD-10-CM | POA: Diagnosis not present

## 2021-06-21 DIAGNOSIS — R0689 Other abnormalities of breathing: Secondary | ICD-10-CM | POA: Diagnosis not present

## 2021-06-21 DIAGNOSIS — E46 Unspecified protein-calorie malnutrition: Secondary | ICD-10-CM | POA: Diagnosis not present

## 2021-06-21 DIAGNOSIS — Z8616 Personal history of COVID-19: Secondary | ICD-10-CM | POA: Diagnosis not present

## 2021-06-21 DIAGNOSIS — Z955 Presence of coronary angioplasty implant and graft: Secondary | ICD-10-CM

## 2021-06-21 DIAGNOSIS — Z743 Need for continuous supervision: Secondary | ICD-10-CM | POA: Diagnosis not present

## 2021-06-21 DIAGNOSIS — I350 Nonrheumatic aortic (valve) stenosis: Secondary | ICD-10-CM | POA: Diagnosis not present

## 2021-06-21 LAB — COMPREHENSIVE METABOLIC PANEL
ALT: 22 U/L (ref 0–44)
AST: 39 U/L (ref 15–41)
Albumin: 2.8 g/dL — ABNORMAL LOW (ref 3.5–5.0)
Alkaline Phosphatase: 129 U/L — ABNORMAL HIGH (ref 38–126)
Anion gap: 14 (ref 5–15)
BUN: 31 mg/dL — ABNORMAL HIGH (ref 8–23)
CO2: 25 mmol/L (ref 22–32)
Calcium: 9.5 mg/dL (ref 8.9–10.3)
Chloride: 102 mmol/L (ref 98–111)
Creatinine, Ser: 0.75 mg/dL (ref 0.44–1.00)
GFR, Estimated: >60 mL/min (ref >60)
Glucose, Bld: 113 mg/dL — ABNORMAL HIGH (ref 70–99)
Potassium: 4.7 mmol/L (ref 3.5–5.1)
Sodium: 141 mmol/L (ref 135–145)
Total Bilirubin: 1.4 mg/dL — ABNORMAL HIGH (ref 0.3–1.2)
Total Protein: 6.9 g/dL (ref 6.5–8.1)

## 2021-06-21 LAB — CBC WITH DIFFERENTIAL/PLATELET
Abs Immature Granulocytes: 0.05 10*3/uL (ref 0.00–0.07)
Basophils Absolute: 0 10*3/uL (ref 0.0–0.1)
Basophils Relative: 0 %
Eosinophils Absolute: 0 10*3/uL (ref 0.0–0.5)
Eosinophils Relative: 0 %
HCT: 36.1 % (ref 36.0–46.0)
Hemoglobin: 11.2 g/dL — ABNORMAL LOW (ref 12.0–15.0)
Immature Granulocytes: 1 %
Lymphocytes Relative: 9 %
Lymphs Abs: 0.6 10*3/uL — ABNORMAL LOW (ref 0.7–4.0)
MCH: 30.8 pg (ref 26.0–34.0)
MCHC: 31 g/dL (ref 30.0–36.0)
MCV: 99.2 fL (ref 80.0–100.0)
Monocytes Absolute: 0.3 10*3/uL (ref 0.1–1.0)
Monocytes Relative: 5 %
Neutro Abs: 5.9 10*3/uL (ref 1.7–7.7)
Neutrophils Relative %: 85 %
Platelets: 342 10*3/uL (ref 150–400)
RBC: 3.64 MIL/uL — ABNORMAL LOW (ref 3.87–5.11)
RDW: 14.3 % (ref 11.5–15.5)
WBC: 6.9 10*3/uL (ref 4.0–10.5)
nRBC: 0 % (ref 0.0–0.2)

## 2021-06-21 LAB — TROPONIN I (HIGH SENSITIVITY)
Troponin I (High Sensitivity): 15 ng/L (ref <18)
Troponin I (High Sensitivity): 19 ng/L — ABNORMAL HIGH (ref <18)

## 2021-06-21 LAB — I-STAT VENOUS BLOOD GAS, ED
Acid-Base Excess: 9 mmol/L — ABNORMAL HIGH (ref 0.0–2.0)
Bicarbonate: 33.7 mmol/L — ABNORMAL HIGH (ref 20.0–28.0)
Calcium, Ion: 1.16 mmol/L (ref 1.15–1.40)
HCT: 33 % — ABNORMAL LOW (ref 36.0–46.0)
Hemoglobin: 11.2 g/dL — ABNORMAL LOW (ref 12.0–15.0)
O2 Saturation: 99 %
Potassium: 4.2 mmol/L (ref 3.5–5.1)
Sodium: 141 mmol/L (ref 135–145)
TCO2: 35 mmol/L — ABNORMAL HIGH (ref 22–32)
pCO2, Ven: 43.4 mmHg — ABNORMAL LOW (ref 44.0–60.0)
pH, Ven: 7.498 — ABNORMAL HIGH (ref 7.250–7.430)
pO2, Ven: 140 mmHg — ABNORMAL HIGH (ref 32.0–45.0)

## 2021-06-21 LAB — LACTIC ACID, PLASMA
Lactic Acid, Venous: 2.5 mmol/L (ref 0.5–1.9)
Lactic Acid, Venous: 1.6 mmol/L (ref 0.5–1.9)

## 2021-06-21 LAB — PROTIME-INR
INR: 1 (ref 0.8–1.2)
Prothrombin Time: 13.6 seconds (ref 11.4–15.2)

## 2021-06-21 LAB — APTT: aPTT: 25 seconds (ref 24–36)

## 2021-06-21 LAB — RESP PANEL BY RT-PCR (FLU A&B, COVID) ARPGX2
Influenza A by PCR: NEGATIVE
Influenza B by PCR: NEGATIVE
SARS Coronavirus 2 by RT PCR: POSITIVE — AB

## 2021-06-21 LAB — BRAIN NATRIURETIC PEPTIDE: B Natriuretic Peptide: 109.6 pg/mL — ABNORMAL HIGH (ref 0.0–100.0)

## 2021-06-21 MED ORDER — GUAIFENESIN-DM 100-10 MG/5ML PO SYRP: 10.0000 mL | ORAL_SOLUTION | ORAL | Status: DC | PRN

## 2021-06-21 MED ORDER — ENOXAPARIN SODIUM 30 MG/0.3ML IJ SOSY
30.0000 mg | PREFILLED_SYRINGE | INTRAMUSCULAR | Status: DC
  Administered 2021-06-22 – 2021-07-02 (×11): 30 mg via SUBCUTANEOUS
  Filled 2021-06-21 (×11): qty 0.3

## 2021-06-21 MED ORDER — METRONIDAZOLE 500 MG/100ML IV SOLN
500.0000 mg | Freq: Once | INTRAVENOUS | Status: AC
  Administered 2021-06-21: 500 mg via INTRAVENOUS
  Filled 2021-06-21: qty 100

## 2021-06-21 MED ORDER — ALBUTEROL SULFATE HFA 108 (90 BASE) MCG/ACT IN AERS
2.0000 | INHALATION_SPRAY | RESPIRATORY_TRACT | Status: DC | PRN
  Administered 2021-06-28 – 2021-07-02 (×2): 2 via RESPIRATORY_TRACT
  Filled 2021-06-21: qty 6.7

## 2021-06-21 MED ORDER — ACETAMINOPHEN 650 MG RE SUPP
650.0000 mg | Freq: Once | RECTAL | Status: AC
  Administered 2021-06-21: 650 mg via RECTAL
  Filled 2021-06-21: qty 1

## 2021-06-21 MED ORDER — SODIUM CHLORIDE 0.9 % IV SOLN: INTRAVENOUS | Status: AC

## 2021-06-21 MED ORDER — ALBUTEROL SULFATE HFA 108 (90 BASE) MCG/ACT IN AERS
2.0000 | INHALATION_SPRAY | Freq: Four times a day (QID) | RESPIRATORY_TRACT | Status: DC
  Administered 2021-06-23 – 2021-06-24 (×7): 2 via RESPIRATORY_TRACT
  Filled 2021-06-21: qty 6.7

## 2021-06-21 MED ORDER — SODIUM CHLORIDE 0.9 % IV SOLN
2.0000 g | INTRAVENOUS | Status: AC
  Administered 2021-06-22 – 2021-06-25 (×5): 2 g via INTRAVENOUS
  Filled 2021-06-21 (×5): qty 20

## 2021-06-21 MED ORDER — SODIUM CHLORIDE 0.9 % IV SOLN
500.0000 mg | INTRAVENOUS | Status: AC
  Administered 2021-06-21 – 2021-06-25 (×5): 500 mg via INTRAVENOUS
  Filled 2021-06-21 (×5): qty 500

## 2021-06-21 MED ORDER — SIMVASTATIN 20 MG PO TABS: 20.0000 mg | ORAL_TABLET | Freq: Every day | ORAL | Status: DC

## 2021-06-21 MED ORDER — ESCITALOPRAM OXALATE 20 MG PO TABS
20.0000 mg | ORAL_TABLET | Freq: Every day | ORAL | Status: DC
  Administered 2021-06-23 – 2021-07-02 (×10): 20 mg via ORAL
  Filled 2021-06-21 (×2): qty 1
  Filled 2021-06-21: qty 2
  Filled 2021-06-21 (×8): qty 1

## 2021-06-21 MED ORDER — SODIUM CHLORIDE 0.9 % IV SOLN
2.0000 g | Freq: Once | INTRAVENOUS | Status: AC
  Administered 2021-06-21: 2 g via INTRAVENOUS
  Filled 2021-06-21: qty 2

## 2021-06-21 MED ORDER — DEXAMETHASONE SODIUM PHOSPHATE 10 MG/ML IJ SOLN
6.0000 mg | INTRAMUSCULAR | Status: AC
  Administered 2021-06-22 – 2021-07-01 (×10): 6 mg via INTRAVENOUS
  Filled 2021-06-21 (×10): qty 1

## 2021-06-21 MED ORDER — SODIUM CHLORIDE 0.9 % IV BOLUS
500.0000 mL | Freq: Once | INTRAVENOUS | Status: AC
  Administered 2021-06-21: 500 mL via INTRAVENOUS

## 2021-06-21 MED ORDER — ONDANSETRON HCL 4 MG/2ML IJ SOLN
4.0000 mg | Freq: Four times a day (QID) | INTRAMUSCULAR | Status: DC | PRN
  Administered 2021-06-23: 4 mg via INTRAVENOUS
  Filled 2021-06-21: qty 2

## 2021-06-21 MED ORDER — VANCOMYCIN HCL IN DEXTROSE 1-5 GM/200ML-% IV SOLN
1000.0000 mg | Freq: Once | INTRAVENOUS | Status: AC
  Administered 2021-06-21: 1000 mg via INTRAVENOUS
  Filled 2021-06-21: qty 200

## 2021-06-21 MED ORDER — LACTATED RINGERS IV SOLN: INTRAVENOUS | Status: DC

## 2021-06-21 MED ORDER — SODIUM CHLORIDE 0.9 % IV SOLN
200.0000 mg | Freq: Once | INTRAVENOUS | Status: AC
  Administered 2021-06-22: 200 mg via INTRAVENOUS
  Filled 2021-06-21: qty 40

## 2021-06-21 MED ORDER — METHYLPREDNISOLONE SODIUM SUCC 125 MG IJ SOLR
125.0000 mg | Freq: Once | INTRAMUSCULAR | Status: AC
  Administered 2021-06-21: 125 mg via INTRAVENOUS
  Filled 2021-06-21: qty 2

## 2021-06-21 MED ORDER — ZINC SULFATE 220 (50 ZN) MG PO CAPS
220.0000 mg | ORAL_CAPSULE | Freq: Every day | ORAL | Status: DC
  Administered 2021-06-23 – 2021-07-02 (×10): 220 mg via ORAL
  Filled 2021-06-21 (×11): qty 1

## 2021-06-21 MED ORDER — ASPIRIN EC 81 MG PO TBEC
81.0000 mg | DELAYED_RELEASE_TABLET | Freq: Every day | ORAL | Status: DC
  Administered 2021-06-23 – 2021-07-02 (×10): 81 mg via ORAL
  Filled 2021-06-21 (×11): qty 1

## 2021-06-21 MED ORDER — SODIUM CHLORIDE 0.9 % IV BOLUS: 1000.0000 mL | Freq: Once | INTRAVENOUS | Status: DC

## 2021-06-21 MED ORDER — ONDANSETRON HCL 4 MG PO TABS: 4.0000 mg | ORAL_TABLET | Freq: Four times a day (QID) | ORAL | Status: DC | PRN

## 2021-06-21 MED ORDER — FLUTICASONE PROPIONATE 50 MCG/ACT NA SUSP
2.0000 | Freq: Every day | NASAL | Status: DC
  Administered 2021-06-23 – 2021-07-02 (×10): 2 via NASAL
  Filled 2021-06-21: qty 16

## 2021-06-21 MED ORDER — POLYETHYLENE GLYCOL 3350 17 G PO PACK: 17.0000 g | PACK | Freq: Every day | ORAL | Status: DC | PRN

## 2021-06-21 MED ORDER — FLUTICASONE FUROATE-VILANTEROL 200-25 MCG/INH IN AEPB
1.0000 | INHALATION_SPRAY | Freq: Every day | RESPIRATORY_TRACT | Status: DC
  Administered 2021-06-23 – 2021-07-02 (×10): 1 via RESPIRATORY_TRACT
  Filled 2021-06-21: qty 28

## 2021-06-21 MED ORDER — ASCORBIC ACID 500 MG PO TABS
500.0000 mg | ORAL_TABLET | Freq: Every day | ORAL | Status: DC
  Administered 2021-06-23 – 2021-07-02 (×10): 500 mg via ORAL
  Filled 2021-06-21 (×11): qty 1

## 2021-06-21 MED ORDER — ALPRAZOLAM 0.25 MG PO TABS
0.2500 mg | ORAL_TABLET | Freq: Two times a day (BID) | ORAL | Status: DC | PRN
  Administered 2021-06-22 – 2021-07-02 (×10): 0.25 mg via ORAL
  Filled 2021-06-21 (×11): qty 1

## 2021-06-21 MED ORDER — SODIUM CHLORIDE 0.9 % IV SOLN
100.0000 mg | Freq: Every day | INTRAVENOUS | Status: AC
  Administered 2021-06-22 – 2021-06-25 (×4): 100 mg via INTRAVENOUS
  Filled 2021-06-21 (×2): qty 100
  Filled 2021-06-21 (×2): qty 20

## 2021-06-21 MED ORDER — LACTATED RINGERS IV BOLUS (SEPSIS)
1000.0000 mL | Freq: Once | INTRAVENOUS | Status: AC
  Administered 2021-06-21: 1000 mL via INTRAVENOUS

## 2021-06-21 MED ORDER — ACETAMINOPHEN 325 MG PO TABS: 650.0000 mg | ORAL_TABLET | Freq: Four times a day (QID) | ORAL | Status: DC | PRN

## 2021-06-21 NOTE — ED Provider Notes (Signed)
[name] Provider Note   CSN: [id number] Arrival date & time: 06/21/21  1600     History Chief Complaint  Patient presents with   Respiratory Distress    Jessica [name] is a 87 y.o. female with past medical history significant for COPD on chronic 3 L of oxygen, s/p TAVR, prior PE not on anticoagulation who presents for evaluation of worsening shortness of breath.  Began Monday.  Family members positive with COVID.  Patient had home test which was negative on Monday.  Daughter gave multiple breathing treatments at home today without improvement.  EMS arrived patient hypoxic into low 70s, subsequently placed on nonrebreather with improvement into the low 90s.  Daughter states normal mentation.  Overall decreased p.o. intake over the last week.  Level 5 Caveat- Respiratory failure   3x albuterol at home  Collateral from daughter Erin Hearing at [phone]>>FULL CODE COVID in family 3x neb at home At baseline mentation No recent falls       HPI     Past Medical History:  Diagnosis Date   Anxiety    congenital nystagmus    COPD (chronic obstructive pulmonary disease) (HCC)    Coronary artery disease    DJD (degenerative joint disease)    Fibromyalgia    Low back pain syndrome    Memory loss    Other and unspecified hyperlipidemia    Pancreatic lesion    Pneumonia    S/P TAVR (transcatheter aortic valve replacement)    26 mm Edwards Sapien 3 via the TF approach   Severe aortic stenosis    Venous insufficiency     Patient Active Problem List   Diagnosis Date Noted   Pressure injury of skin 09/19/2019   Acute right hip pain 09/17/2019   S/P TAVR (transcatheter aortic valve replacement)    Chronic respiratory failure ([place]) 12/15/2018   Depression 12/15/2018   CAD (coronary artery disease) 12/15/2018   Chronic diastolic CHF (congestive heart failure) ([place]) 12/15/2018   History of pulmonary embolism 08/25/2018    Severe aortic stenosis    Aortic atherosclerosis ([place]) 05/20/2018   Insomnia 04/08/2016   Chronic obstructive pulmonary disease ([place]) 05/07/2015   Labial cyst 05/07/2015   Exertional dyspnea 02/26/2015   Vitamin D deficiency 01/14/2009   Hyperlipidemia 01/14/2009   DEGENERATIVE JOINT DISEASE 06/12/2008   Fibromyalgia 12/15/2007   Generalized anxiety disorder 12/14/2007    Past Surgical History:  Procedure Laterality Date   ABDOMINAL HYSTERECTOMY     ANTERIOR CERVICAL DISCECTOMY     APPENDECTOMY     CARPAL TUNNEL RELEASE  12/2011   right arm   CATARACT EXTRACTION     CORONARY ATHERECTOMY  07/23/2018   PTCA/orbital atherectomy/DES x 1 proximal to mid LAD   CORONARY ATHERECTOMY N/A 07/23/2018   Procedure: CORONARY ATHERECTOMY;  Surgeon: Burnell Blanks, MD;  Location: [facility];  Service: Cardiovascular;  Laterality: N/A;   CORONARY STENT INTERVENTION     CORONARY STENT INTERVENTION N/A 07/23/2018   Procedure: CORONARY STENT INTERVENTION;  Surgeon: Burnell Blanks, MD;  Location: [facility];  Service: Cardiovascular;  Laterality: N/A;   EYE SURGERY     INTRAMEDULLARY (IM) NAIL INTERTROCHANTERIC Right 09/19/2019   Procedure: INTRAMEDULLARY (IM) NAIL INTERTROCHANTRIC;  Surgeon: Renette Butters, MD;  Location: [facility];  Service: Orthopedics;  Laterality: Right;   LUMBAR LAMINECTOMY     right knee arthroscopy     right shoulder replacement  04/2009   right  shoulder surgery  2008   Dr. Noemi Chapel   RIGHT/LEFT HEART CATH AND CORONARY ANGIOGRAPHY N/A 07/07/2018   Procedure: RIGHT/LEFT HEART CATH AND CORONARY ANGIOGRAPHY;  Surgeon: Burnell Blanks, MD;  Location: [facility];  Service: Cardiovascular;  Laterality: N/A;   TEE WITHOUT CARDIOVERSION N/A 03/29/2019   Procedure: TRANSESOPHAGEAL ECHOCARDIOGRAM (TEE);  Surgeon: Burnell Blanks, MD;  Location: [facility];  Service: Open Heart Surgery;  Laterality: N/A;   TRANSCATHETER AORTIC VALVE  REPLACEMENT, TRANSFEMORAL N/A 03/29/2019   Procedure: TRANSCATHETER AORTIC VALVE REPLACEMENT, TRANSFEMORAL;  Surgeon: Burnell Blanks, MD;  Location: [facility];  Service: Open Heart Surgery;  Laterality: N/A;   ULTRASOUND GUIDANCE FOR VASCULAR ACCESS  07/07/2018   Procedure: Ultrasound Guidance For Vascular Access;  Surgeon: Burnell Blanks, MD;  Location: [facility];  Service: Cardiovascular;;     OB History   No obstetric history on file.     Family History  Problem Relation Age of Onset   Other Mother        broken hip   Other Father        kidney problems   Colon cancer Neg Hx    Stomach cancer Neg Hx    Rectal cancer Neg Hx    Pancreatic cancer Neg Hx     Social History   Tobacco Use   Smoking status: Former    Packs/day: 2.00    Years: 30.00    Pack years: 60.00    Types: Cigarettes    Quit date: 10/28/1991    Years since quitting: 29.6   Smokeless tobacco: Never  Vaping Use   Vaping Use: Never used  Substance Use Topics   Alcohol use: Not Currently    Comment: 1 drink per night   Drug use: No    Home Medications Prior to Admission medications   Medication Sig Start Date End Date Taking? Authorizing Provider  albuterol (PROVENTIL) (2.5 MG/3ML) 0.083% nebulizer solution USE 1 VIAL IN NEBULIZER 6 TIMES DAILY - (for rescue) (Max 30 doses per month) 08/30/20   Kasa, Maretta Bees, MD  albuterol (VENTOLIN HFA) 108 (90 Base) MCG/ACT inhaler INHALE 2 PUFFS BY MOUTH EVERY 6 HOURS AS NEEDED FOR WHEEZING 10/05/20   Flora Lipps, MD  ALPRAZolam Duanne Moron) 0.5 MG tablet Take 1 tablet (0.5 mg total) by mouth 2 (two) times daily as needed for anxiety. 05/10/21   [name], [name], MD  AMBULATORY NON FORMULARY MEDICATION Medication Name: Incentive spirometer Use 10-15 times per day 12/14/18   Flora Lipps, MD  aspirin EC 81 MG tablet Take 1 tablet (81 mg total) by mouth daily. 04/01/19   Eileen Stanford, PA-C  BROVANA 15 MCG/2ML NEBU USE 1 VIAL  IN  NEBULIZER TWICE  DAILY  - morning and evening 11/26/20   Flora Lipps, MD  budesonide (PULMICORT) 0.5 MG/2ML nebulizer solution USE 1 VIAL  IN  NEBULIZER TWICE  DAILY - Rinse mouth after treatment 11/26/20   Flora Lipps, MD  escitalopram (LEXAPRO) 20 MG tablet TAKE 1 TABLET BY MOUTH EVERY DAY 04/08/21   [name], [name], MD  fluticasone [facility]) 50 MCG/ACT nasal spray Place 2 sprays into both nostrils daily. 12/18/18   Ghimire, Henreitta Leber, MD  HYDROcodone-acetaminophen (NORCO) 5-325 MG tablet Take 0.5 tablets by mouth every 12 (twelve) hours as needed for severe pain. 05/10/21   [name], [name], MD  predniSONE (STERAPRED UNI-PAK 21 TAB) 10 MG (21) TBPK tablet Use as directed. 05/15/21   Tyler Pita, MD  Respiratory Therapy Supplies (FLUTTER) DEVI 1 each by Does not apply route daily. 12/14/18   Flora Lipps, MD  senna-docusate (SENOKOT-S) 8.6-50 MG tablet Take 2 tablets by mouth 2 (two) times daily as needed for mild constipation or moderate constipation. 09/21/19   Mercy Riding, MD  tiZANidine (ZANAFLEX) 2 MG tablet TAKE 0.5-1 TABLETS (1-2 MG TOTAL) BY MOUTH AT BEDTIME AS NEEDED FOR MUSCLE SPASMS. 09/24/20   [name], [name], MD  vitamin B-12 (CYANOCOBALAMIN) 1000 MCG tablet Take 1,000 mcg by mouth daily.    [provider]    Allergies    Contrast media [iodinated diagnostic agents] and Iohexol  Review of Systems   Review of Systems  Unable to perform ROS: Severe respiratory distress   Physical Exam Updated Vital Signs BP 133/67   Pulse (!) 115   Temp (!) 104.9 F (40.5 C) (Rectal)   Resp (!) 23   SpO2 100%   Physical Exam Constitutional:      General: She is in acute distress.     Appearance: She is ill-appearing and toxic-appearing. She is not diaphoretic.     Comments: Thin, frail, cachectic  HENT:     Head: Normocephalic and atraumatic.     Nose: No congestion or rhinorrhea.     Mouth/Throat:     Mouth: Mucous membranes are dry.  Cardiovascular:     Rate and Rhythm: Normal rate.      Pulses:          Radial pulses are 2+ on the right side and 2+ on the left side.       Dorsalis pedis pulses are 2+ on the right side and 2+ on the left side.  Pulmonary:     Comments: Course lung, 15L Non rebreather Abdominal:     General: Bowel sounds are normal.     Palpations: Abdomen is soft.  Musculoskeletal:        General: No swelling. Normal range of motion.     Right lower leg: No edema.     Left lower leg: No edema.  Skin:    General: Skin is warm.     Capillary Refill: Capillary refill takes less than 2 seconds.  Neurological:     Mental Status: Mental status is at baseline.     Comments: At baseline per EMS Moves extremities spontaneously No facial droop   ED Results / Procedures / Treatments   Labs (all labs ordered are listed, but only abnormal results are displayed) Labs Reviewed  RESP PANEL BY RT-PCR (FLU A&B, COVID) ARPGX2  CULTURE, BLOOD (ROUTINE X 2)  CULTURE, BLOOD (ROUTINE X 2)  URINE CULTURE  LACTIC ACID, PLASMA  LACTIC ACID, PLASMA  COMPREHENSIVE METABOLIC PANEL  CBC WITH DIFFERENTIAL/PLATELET  PROTIME-INR  APTT  URINALYSIS, ROUTINE W REFLEX MICROSCOPIC  BRAIN NATRIURETIC PEPTIDE  TROPONIN I (HIGH SENSITIVITY)    EKG EKG Interpretation  Date/Time:  Friday June 21 2021 16:08:04 EDT Ventricular Rate:  119 PR Interval:  149 QRS Duration: 94 QT Interval:  318 QTC Calculation: 448 R Axis:   53 Text Interpretation: Sinus tachycardia LVH with secondary repolarization abnormality Anterior infarct, old Confirmed by Dene Gentry [phone]) on 06/21/2021 4:17:55 PM  Radiology DG Chest Port 1 View  Result Date: 06/21/2021 CLINICAL DATA:  87 year old female with questionable sepsis EXAM: PORTABLE CHEST 1 VIEW COMPARISON:  02/03/2021 FINDINGS: Cardiomediastinal silhouette unchanged in size and contour. No evidence of central vascular congestion. No interlobular septal thickening. Changes of prior TAVR again noted. Increased reticulonodular  opacities of  the right lung. No pneumothorax or pleural effusion. No pneumothorax or pleural effusion. No acute displaced fracture. Surgical changes of the right glenohumeral joint. Chronic deformity of the right clavicle. IMPRESSION: Increased right-sided reticulonodular opacities, concerning for multifocal infection or possibly aspiration. Surgical changes of prior TAVR. Electronically Signed   By: Corrie Mckusick D.O.   On: 06/21/2021 16:37     ECHO 04/11/20  EF 70-75% normal TAVR  Procedures .Critical Care  Date/Time: 06/21/2021 8:39 PM Performed by: Nettie Elm, PA-C Authorized by: Nettie Elm, PA-C   Critical care provider statement:    Critical care time (minutes):  45   Critical care was necessary to treat or prevent imminent or life-threatening deterioration of the following conditions:  Sepsis and respiratory failure   Critical care was time spent personally by me on the following activities:  Discussions with consultants, evaluation of patient's response to treatment, examination of patient, ordering and performing treatments and interventions, ordering and review of laboratory studies, ordering and review of radiographic studies, pulse oximetry, re-evaluation of patient's condition, obtaining history from patient or surrogate and review of old charts   Medications Ordered in ED Medications  lactated ringers infusion (has no administration in time range)  lactated ringers bolus 1,000 mL (1,000 mLs Intravenous New Bag/Given 06/21/21 1627)  ceFEPIme (MAXIPIME) 2 g in sodium chloride 0.9 % 100 mL IVPB (2 g Intravenous New Bag/Given 06/21/21 1638)  metroNIDAZOLE (FLAGYL) IVPB 500 mg (500 mg Intravenous New Bag/Given 06/21/21 1639)  vancomycin (VANCOCIN) IVPB 1000 mg/200 mL premix (has no administration in time range)  acetaminophen (TYLENOL) suppository 650 mg (650 mg Rectal Given 06/21/21 1635)   ED Course  I have reviewed the triage vital signs and the nursing notes.  Pertinent labs  & imaging results that were available during my care of the patient were reviewed by me and considered in my medical decision making (see chart for details).  87 year old here for evaluation of shortness of breath.  On arrival patient is febrile, toxic and ill-appearing.  She is on 15 L via nonrebreather up from her baseline 3 L.  Coarse lung sounds throughout.  Nonfocal neuro exam.  Abdomen soft, nontender.  Family positive for COVID.  Long discussion with daughter via telephone on patient's arrival.  She reiterates multiple times that patient is FULL CODE.  Code sepsis called on arrival.  Broad-spectrum antibiotics as well as IV fluids administered.  Will obtain COVID test given known family history.  Labs and imaging personally reviewed and interpreted:  DG chest with multifocal PNA vs aspiration CBC without leukocytosis BNP 109 CMP glucose 113, BUN 31, alk phos 129, T bili 1.4 COVID positive VBG without acidosis, co2 43,  Lactic 2.5>>>1.6 Trop 15>>19.  Feel likely due to demand EKG without ischemic changes  Clinical Course as of 06/22/21 0003  Fri Jun 21, 2021  1845 BP into 36'I systolic with MAP at 60. Added IVF for 30/cc/kg bolus [BH]  1910 Recheck BP normal suspect earlier reading due to malpositioned BP cuff [BH]  1918 Recheck 7 L NRB, Oxygen at 98%. Congested>>Mouth breather cannot transition to [place] [BH]  2026 [place] with [facility] to admit [BH]    Clinical Course User Index [BH] Kymberly Blomberg A, PA-C   Discussed work-up with patient and family in room.  Respiratory status okay on 7 L via nonrebreather, unable to transition to nasal care due to congestion, mouth breathing.  Does intermittently appear anxious. I do not feel she needs BiPAP  at this time.  CONSULT with Dr. Marlyce Huge with [facility] who will evaluate for admission  The patient appears reasonably stabilized for admission considering the current resources, flow, and capabilities available in the ED at this time, and I doubt any  other [facility] requiring further screening and/or treatment in the ED prior to admission.   Patient seen by attending who agrees with above treatment, plan and disposition  MDM Rules/Calculators/A&P                            Final Clinical Impression(s) / ED Diagnoses Final diagnoses:  COVID  Acute on chronic respiratory failure with hypoxia [facility])    Rx / DC Orders ED Discharge Orders     None        Khalid Lacko A, PA-C 06/22/21 0005    Valarie Merino, MD 06/26/21 2241

## 2021-06-21 NOTE — Progress Notes (Signed)
RT note. Per MD patient placed on venti mask at 10L-45% sat 100%. Sat goal of 94%. RT will continue to monitor.

## 2021-06-21 NOTE — H&P (Signed)
History and Physical    Jessica [name] [facility] [id number] DOB: 1925/11/18 DOA: 06/21/2021  PCP: [name], [name], MD  Patient coming from: Home via EMS   Chief Complaint:  Chief Complaint  Patient presents with   Respiratory Distress     HPI:    87 year old female with past medical history of COPD, chronic respiratory failure, dysphagia, diastolic congestive heart failure (Echo 03/2020 EF 70-75%), generalized anxiety disorder, severe aortic regurgitation status post TAVR and coronary artery disease (cardiac cath 06/2018  with arthrectomy and DES to LAD) who presents to [facility] emergency department via EMS due to progressively worsening shortness of breath weakness and confusion.  Patient is extremely confused and is a poor historian at this point secondary to metabolic encephalopathy.  The majority the history has been obtained from the daughter via phone conversation.  Daughter explains that her and the rest of the family were COVID-positive over the past week.  She explains that her mother is vaccinated and boosted for COVID-19 but unfortunately she frequently shares a room with her mother over the span of time.  Approximately 2 to 3 days ago she noticed that her mother was beginning to exhibit increasing generalized weakness with associated increasing lethargy.  Shortly thereafter the patient began to exhibit worsening shortness of breath.  She states that over the span of the next few days she gave her mother frequent breathing treatments without improvement in her worsening shortness of breath.  She denies that her mother had any fevers in the days preceding her presentation.  Upon further questioning, daughter additionally explains that the patient frequently coughs when attempting to eat particularly as of late.  She is supposed to provide her mother with a mechanical soft diet with nectar thickened liquids but as of late in an effort to improve oral intake she began to provide  her mother with a straw to drink liquids even though she was advised not to.  Due to patient progressively worsening lethargy confusion and weakness EMS was contacted who promptly came to evaluate the patient and brought her into [facility] emergency department for evaluation.  In route, patient was placed on a nonrebreather mask.  Upon evaluation in the emergency department patient was found to be positive for COVID-19.  Chest x-ray was additionally performed and found to have right-sided pulmonary infiltrates, worse in the right lower lobe.  Patient was initially found to exhibit multiple SIRS criteria including markedly elevated fevers as high as 104.31F .  Patient was additionally found to exhibit a lactic acidosis of 2.5.  Patient was initiated on isotonic intravenous fluids and given broad-spectrum intravenous antibiotics.  The hospitalist group was then called to assess the patient for admission to the hospital.  Review of Systems:   Review of Systems  Unable to perform ROS: Mental status change   Past Medical History:  Diagnosis Date   Anxiety    congenital nystagmus    COPD (chronic obstructive pulmonary disease) (HCC)    Coronary artery disease    DJD (degenerative joint disease)    Fibromyalgia    Low back pain syndrome    Memory loss    Other and unspecified hyperlipidemia    Pancreatic lesion    Pneumonia    S/P TAVR (transcatheter aortic valve replacement)    26 mm Edwards Sapien 3 via the TF approach   Severe aortic stenosis    Venous insufficiency     Past Surgical History:  Procedure Laterality Date   ABDOMINAL  HYSTERECTOMY     ANTERIOR CERVICAL DISCECTOMY     APPENDECTOMY     CARPAL TUNNEL RELEASE  12/2011   right arm   CATARACT EXTRACTION     CORONARY ATHERECTOMY  07/23/2018   PTCA/orbital atherectomy/DES x 1 proximal to mid LAD   CORONARY ATHERECTOMY N/A 07/23/2018   Procedure: CORONARY ATHERECTOMY;  Surgeon: Burnell Blanks, MD;  Location:  [place] [facility];  Service: Cardiovascular;  Laterality: N/A;   CORONARY STENT INTERVENTION     CORONARY STENT INTERVENTION N/A 07/23/2018   Procedure: CORONARY STENT INTERVENTION;  Surgeon: Burnell Blanks, MD;  Location: [place] [facility];  Service: Cardiovascular;  Laterality: N/A;   EYE SURGERY     INTRAMEDULLARY (IM) NAIL INTERTROCHANTERIC Right 09/19/2019   Procedure: INTRAMEDULLARY (IM) NAIL INTERTROCHANTRIC;  Surgeon: Renette Butters, MD;  Location: [place];  Service: Orthopedics;  Laterality: Right;   LUMBAR LAMINECTOMY     right knee arthroscopy     right shoulder replacement  04/2009   right shoulder surgery  2008   Dr. Noemi Chapel   RIGHT/LEFT HEART CATH AND CORONARY ANGIOGRAPHY N/A 07/07/2018   Procedure: RIGHT/LEFT HEART CATH AND CORONARY ANGIOGRAPHY;  Surgeon: Burnell Blanks, MD;  Location: [place] [facility];  Service: Cardiovascular;  Laterality: N/A;   TEE WITHOUT CARDIOVERSION N/A 03/29/2019   Procedure: TRANSESOPHAGEAL ECHOCARDIOGRAM (TEE);  Surgeon: Burnell Blanks, MD;  Location: [place];  Service: Open Heart Surgery;  Laterality: N/A;   TRANSCATHETER AORTIC VALVE REPLACEMENT, TRANSFEMORAL N/A 03/29/2019   Procedure: TRANSCATHETER AORTIC VALVE REPLACEMENT, TRANSFEMORAL;  Surgeon: Burnell Blanks, MD;  Location: [place];  Service: Open Heart Surgery;  Laterality: N/A;   ULTRASOUND GUIDANCE FOR VASCULAR ACCESS  07/07/2018   Procedure: Ultrasound Guidance For Vascular Access;  Surgeon: Burnell Blanks, MD;  Location: [place] [facility];  Service: Cardiovascular;;     reports that she quit smoking about 29 years ago. Her smoking use included cigarettes. She has a 60.00 pack-year smoking history. She has never used smokeless tobacco. She reports that she does not currently use alcohol. She reports that she does not use drugs.  Allergies  Allergen Reactions   Contrast Media [Iodinated Diagnostic Agents] Itching, Rash and Other (See Comments)     Hypotension, Skin turns red like a sunburn   Iohexol Itching, Rash and Other (See Comments)    Hypotension, Skin turns red like a sunburn    Family History  Problem Relation Age of Onset   Other Mother        broken hip   Other Father        kidney problems   Colon cancer Neg Hx    Stomach cancer Neg Hx    Rectal cancer Neg Hx    Pancreatic cancer Neg Hx      Prior to Admission medications   Medication Sig Start Date End Date Taking? Authorizing Provider  albuterol (PROVENTIL) (2.5 MG/3ML) 0.083% nebulizer solution USE 1 VIAL IN NEBULIZER 6 TIMES DAILY - (for rescue) (Max 30 doses per month) 08/30/20   Kasa, Maretta Bees, MD  albuterol (VENTOLIN HFA) 108 (90 Base) MCG/ACT inhaler INHALE 2 PUFFS BY MOUTH EVERY 6 HOURS AS NEEDED FOR WHEEZING 10/05/20   Flora Lipps, MD  ALPRAZolam Duanne Moron) 0.5 MG tablet Take 1 tablet (0.5 mg total) by mouth 2 (two) times daily as needed for anxiety. 05/10/21   [name], [name], MD  AMBULATORY NON FORMULARY MEDICATION Medication Name: Incentive spirometer Use 10-15 times per day 12/14/18  Flora Lipps, MD  aspirin EC 81 MG tablet Take 1 tablet (81 mg total) by mouth daily. 04/01/19   Eileen Stanford, PA-C  BROVANA 15 MCG/2ML NEBU USE 1 VIAL  IN  NEBULIZER TWICE  DAILY - morning and evening 11/26/20   Flora Lipps, MD  budesonide (PULMICORT) 0.5 MG/2ML nebulizer solution USE 1 VIAL  IN  NEBULIZER TWICE  DAILY - Rinse mouth after treatment 11/26/20   Flora Lipps, MD  escitalopram (LEXAPRO) 20 MG tablet TAKE 1 TABLET BY MOUTH EVERY DAY 04/08/21   [name], [name], MD  fluticasone [facility]) 50 MCG/ACT nasal spray Place 2 sprays into both nostrils daily. 12/18/18   Ghimire, Henreitta Leber, MD  HYDROcodone-acetaminophen (NORCO) 5-325 MG tablet Take 0.5 tablets by mouth every 12 (twelve) hours as needed for severe pain. 05/10/21   [name], [name], MD  predniSONE (STERAPRED UNI-PAK 21 TAB) 10 MG (21) TBPK tablet Use as directed. 05/15/21   Tyler Pita, MD  Respiratory  Therapy Supplies (FLUTTER) DEVI 1 each by Does not apply route daily. 12/14/18   Flora Lipps, MD  senna-docusate (SENOKOT-S) 8.6-50 MG tablet Take 2 tablets by mouth 2 (two) times daily as needed for mild constipation or moderate constipation. 09/21/19   Mercy Riding, MD  tiZANidine (ZANAFLEX) 2 MG tablet TAKE 0.5-1 TABLETS (1-2 MG TOTAL) BY MOUTH AT BEDTIME AS NEEDED FOR MUSCLE SPASMS. 09/24/20   [name], [name], MD  vitamin B-12 (CYANOCOBALAMIN) 1000 MCG tablet Take 1,000 mcg by mouth daily.    [provider]    Physical Exam: Vitals:   06/21/21 2000 06/21/21 2015 06/21/21 2030 06/21/21 2045  BP: (!) 105/50 (!) 105/57 (!) 106/51 104/77  Pulse: 81 86 78 85  Resp: (!) 21 (!) 24 (!) 22 (!) 27  Temp:      TempSrc:      SpO2: 100% 100% 98% 99%    Constitutional: Extreme lethargic, arousable, disoriented, minimally verbal.  Patient is in respiratory distress.  Patient is cachectic. Skin: Dry flaky skin.  No rashes, no lesions, extremely poor skin turgor noted Eyes: Pupils are equally reactive to light.  No evidence of scleral icterus or conjunctival pallor.  ENMT: Extremely dry mucous membranes noted.  Posterior pharynx clear of any exudate or lesions.   Neck: normal, supple, no masses, no thyromegaly.  No evidence of jugular venous distension.   Respiratory: Scattered rhonchi bilaterally with bibasilar rales, worse in the right lower field.  No evidence of associated wheezing.  Patient is in notable respiratory distress with increased respiratory effort and evidence of accessory muscle use.   Cardiovascular: Tachycardic rate with regular rhythm, no murmurs / rubs / gallops. No extremity edema. 2+ pedal pulses. No carotid bruits.  Chest:   Nontender without crepitus or deformity.   Back:   Nontender without crepitus or deformity. Abdomen: Abdomen is soft and nontender.  No evidence of intra-abdominal masses.  Positive bowel sounds noted in all quadrants.   Musculoskeletal: Notable  poor muscle tone.  No joint deformity upper and lower extremities. Good ROM, no contractures. .  Neurologic: Patient is extremely lethargic but arousable.  Patient is minimally verbal and disoriented.  Patient intermittently follows commands.  Patient is moving all 4 extremities spontaneously.  Sensation is seemingly grossly intact. Psychiatric: Unable to assess due to severe lethargy and confusion.  Patient currently does not seem to possess insight as to her current situation.  Labs on Admission: I have personally reviewed following labs and imaging studies -  CBC: Recent Labs  Lab 06/21/21 1619 06/21/21 1800  WBC 6.9  --   NEUTROABS 5.9  --   HGB 11.2* 11.2*  HCT 36.1 33.0*  MCV 99.2  --   PLT 342  --    Basic Metabolic Panel: Recent Labs  Lab 06/21/21 1619 06/21/21 1800  NA 141 141  K 4.7 4.2  CL 102  --   CO2 25  --   GLUCOSE 113*  --   BUN 31*  --   CREATININE 0.75  --   CALCIUM 9.5  --    GFR: CrCl cannot be calculated (Unknown ideal weight.). Liver Function Tests: Recent Labs  Lab 06/21/21 1619  AST 39  ALT 22  ALKPHOS 129*  BILITOT 1.4*  PROT 6.9  ALBUMIN 2.8*   No results for input(s): LIPASE, AMYLASE in the last 168 hours. No results for input(s): AMMONIA in the last 168 hours. Coagulation Profile: Recent Labs  Lab 06/21/21 1636  INR 1.0   Cardiac Enzymes: No results for input(s): CKTOTAL, CKMB, CKMBINDEX, TROPONINI in the last 168 hours. BNP (last 3 results) No results for input(s): PROBNP in the last 8760 hours. HbA1C: No results for input(s): HGBA1C in the last 72 hours. CBG: No results for input(s): GLUCAP in the last 168 hours. Lipid Profile: No results for input(s): CHOL, HDL, LDLCALC, TRIG, CHOLHDL, LDLDIRECT in the last 72 hours. Thyroid Function Tests: No results for input(s): TSH, T4TOTAL, FREET4, T3FREE, THYROIDAB in the last 72 hours. Anemia Panel: No results for input(s): VITAMINB12, FOLATE, FERRITIN, TIBC, IRON, RETICCTPCT in  the last 72 hours. Urine analysis:    Component Value Date/Time   COLORURINE STRAW (A) 10/05/2019 0633   APPEARANCEUR HAZY (A) 10/05/2019 0633   LABSPEC 1.004 (L) 10/05/2019 0633   PHURINE 6.0 10/05/2019 0633   GLUCOSEU NEGATIVE 10/05/2019 0633   GLUCOSEU NEGATIVE 07/06/2009 1057   HGBUR SMALL (A) 10/05/2019 0633   BILIRUBINUR 3+ 01/16/2020 1448   KETONESUR NEGATIVE 10/05/2019 0633   PROTEINUR Positive (A) 01/16/2020 1448   PROTEINUR NEGATIVE 10/05/2019 0633   UROBILINOGEN 2.0 (A) 01/16/2020 1448   UROBILINOGEN 0.2 07/06/2009 1057   NITRITE Negative 01/16/2020 1448   NITRITE NEGATIVE 10/05/2019 0633   LEUKOCYTESUR Negative 01/16/2020 1448   LEUKOCYTESUR NEGATIVE 10/05/2019 [id number]    Radiological Exams on Admission - Personally Reviewed: DG Chest Port 1 View  Result Date: 06/21/2021 CLINICAL DATA:  87 year old female with questionable sepsis EXAM: PORTABLE CHEST 1 VIEW COMPARISON:  02/03/2021 FINDINGS: Cardiomediastinal silhouette unchanged in size and contour. No evidence of central vascular congestion. No interlobular septal thickening. Changes of prior TAVR again noted. Increased reticulonodular opacities of the right lung. No pneumothorax or pleural effusion. No pneumothorax or pleural effusion. No acute displaced fracture. Surgical changes of the right glenohumeral joint. Chronic deformity of the right clavicle. IMPRESSION: Increased right-sided reticulonodular opacities, concerning for multifocal infection or possibly aspiration. Surgical changes of prior TAVR. Electronically Signed   By: Corrie Mckusick D.O.   On: 06/21/2021 16:37    EKG: Personally reviewed.  Rhythm is sinus tachycardia with heart rate of 119 bpm.  Evidence of LVH.  No dynamic ST segment changes appreciated.  Assessment/Plan Principal Problem:   Acute on chronic respiratory failure with hypoxia (HCC) secondary to COVID-19 virus infection with superimposed aspiration pneumonia of the right lung  Patient presenting  with acute on chronic hypoxic respiratory failure multifactorial secondary to COVID-19 infection with likely superimposed aspiration pneumonia of the right l lung.  A To dress the  patient's COVID-19 infection, treating patient with a combination of remdesivir and Decadron To address the patient's suspected superimposed aspiration pneumonia patient is being treated with intravenous antibiotics.  Based on IDSA guidance, in the absence of empyema or abscess ceftriaxone and azithromycin are adequate. Provide patient with supplemental oxygen to achieve oxygen saturations of 94% considering COVID diagnosis. Scheduled bronchodilator therapy with albuterol via MDI.  Avoidance of nebulized treatments due to ongoing COVID infection. As needed antitussives Zinc and vitamin C supplementation Contact and airborne isolation.  Active Problems:    Sepsis with organ dysfunction [facility])  Patient exhibiting multiple SIRS criteria including extremely high fever of nearly 105 F in the emergency department.  Patient exhibiting multiple signs of organ dysfunction including lactic acidosis, encephalopathy and respiratory failure.  Progressive sepsis is likely secondary to right-sided aspiration pneumonia and COVID-19 infection. Aggressive intravenous volume resuscitation with isotonic fluids Both intravenous antibacterials as noted above as well as intravenous remdesivir Blood cultures have been obtained. Additionally planning on obtaining urinalysis and urine culture. Procalcitonin pending Placing patient in progressive care unit for close clinical monitoring.    Acute metabolic encephalopathy  Likely secondary to underlying infection, hypoxia and volume depletion Treating underlying illness and hopefully encephalopathy will spontaneously clear According to family, patient has no history of dementia and has no problems with cognition at baseline Will expand work-up if confusion and lethargy fail to improve with  improving infection    Lactic acidosis Lactic acidosis likely secondary to volume depletion and underlying infection Hydrating patient with intravenous isotonic fluids and intravenous antibiotics. Performing serial lactic acid levels to ensure downtrending and resolution    Mixed hyperlipidemia  Continuing home regimen of simvastatin 20 mg daily based on most recent cardiology note    Coronary artery disease involving native coronary artery of native heart without angina pectoris  No evidence of dynamic EKG change, no complaints of chest pain although patient is quite confused at this time. Monitoring patient on telemetry Continue home regimen of statin and aspirin therapy    Chronic diastolic CHF (congestive heart failure) (HCC)  No evidence of cardiogenic volume overload at this time  Dysphagia  Daughter reports longstanding history of dysphagia with patient need to be on mechanical soft diet with nectar thickened liquids at baseline Considering daughters statement patient has been exhibiting cough with meals as well of evidence of right-sided aspiration pneumonia on chest imaging, will ask for SLP repeat evaluation of swallow function   Code Status:  Full code Family Communication: Plan of care discussed with daughter via phone conversation.  Status is: Inpatient  Remains inpatient appropriate because:Ongoing diagnostic testing needed not appropriate for outpatient work up, IV treatments appropriate due to intensity of illness or inability to take PO, and Inpatient level of care appropriate due to severity of illness  Dispo: The patient is from: Home              Anticipated d/c is to: Home              Patient currently is not medically stable to d/c.   Difficult to place patient No        Vernelle Emerald MD [facility] Pager [phone]  If 7PM-7AM, please contact night-coverage [email or web address] Use universal [place] password for that web site. If  you do not have the password, please call the hospital operator.  06/21/2021, 10:02 PM

## 2021-06-21 NOTE — Progress Notes (Signed)
Elink following Code Sepsis. 

## 2021-06-22 ENCOUNTER — Other Ambulatory Visit: Payer: Self-pay

## 2021-06-22 LAB — URINALYSIS, ROUTINE W REFLEX MICROSCOPIC
Bilirubin Urine: NEGATIVE
Glucose, UA: NEGATIVE mg/dL
Hgb urine dipstick: NEGATIVE
Ketones, ur: NEGATIVE mg/dL
Leukocytes,Ua: NEGATIVE
Nitrite: NEGATIVE
Protein, ur: NEGATIVE mg/dL
Specific Gravity, Urine: 1.029 (ref 1.005–1.030)
pH: 6 (ref 5.0–8.0)

## 2021-06-22 LAB — CBC WITH DIFFERENTIAL/PLATELET
Abs Immature Granulocytes: 0.05 10*3/uL (ref 0.00–0.07)
Basophils Absolute: 0 10*3/uL (ref 0.0–0.1)
Basophils Relative: 0 %
Eosinophils Absolute: 0 10*3/uL (ref 0.0–0.5)
Eosinophils Relative: 0 %
HCT: 30.5 % — ABNORMAL LOW (ref 36.0–46.0)
Hemoglobin: 9.3 g/dL — ABNORMAL LOW (ref 12.0–15.0)
Immature Granulocytes: 1 %
Lymphocytes Relative: 4 %
Lymphs Abs: 0.4 10*3/uL — ABNORMAL LOW (ref 0.7–4.0)
MCH: 30.7 pg (ref 26.0–34.0)
MCHC: 30.5 g/dL (ref 30.0–36.0)
MCV: 100.7 fL — ABNORMAL HIGH (ref 80.0–100.0)
Monocytes Absolute: 0.1 10*3/uL (ref 0.1–1.0)
Monocytes Relative: 1 %
Neutro Abs: 8.6 10*3/uL — ABNORMAL HIGH (ref 1.7–7.7)
Neutrophils Relative %: 94 %
Platelets: 267 10*3/uL (ref 150–400)
RBC: 3.03 MIL/uL — ABNORMAL LOW (ref 3.87–5.11)
RDW: 14.1 % (ref 11.5–15.5)
WBC: 9.1 10*3/uL (ref 4.0–10.5)
nRBC: 0 % (ref 0.0–0.2)

## 2021-06-22 LAB — COMPREHENSIVE METABOLIC PANEL
ALT: 18 U/L (ref 0–44)
AST: 28 U/L (ref 15–41)
Albumin: 2.3 g/dL — ABNORMAL LOW (ref 3.5–5.0)
Alkaline Phosphatase: 105 U/L (ref 38–126)
Anion gap: 9 (ref 5–15)
BUN: 28 mg/dL — ABNORMAL HIGH (ref 8–23)
CO2: 26 mmol/L (ref 22–32)
Calcium: 8.6 mg/dL — ABNORMAL LOW (ref 8.9–10.3)
Chloride: 106 mmol/L (ref 98–111)
Creatinine, Ser: 0.74 mg/dL (ref 0.44–1.00)
GFR, Estimated: >60 mL/min (ref >60)
Glucose, Bld: 173 mg/dL — ABNORMAL HIGH (ref 70–99)
Potassium: 4.1 mmol/L (ref 3.5–5.1)
Sodium: 141 mmol/L (ref 135–145)
Total Bilirubin: 0.7 mg/dL (ref 0.3–1.2)
Total Protein: 6.1 g/dL — ABNORMAL LOW (ref 6.5–8.1)

## 2021-06-22 LAB — C-REACTIVE PROTEIN: CRP: 7.7 mg/dL — ABNORMAL HIGH (ref <1.0)

## 2021-06-22 LAB — PROTIME-INR
INR: 1.1 (ref 0.8–1.2)
Prothrombin Time: 14.1 seconds (ref 11.4–15.2)

## 2021-06-22 LAB — MAGNESIUM: Magnesium: 1.8 mg/dL (ref 1.7–2.4)

## 2021-06-22 LAB — PROCALCITONIN: Procalcitonin: 7.83 ng/mL

## 2021-06-22 LAB — D-DIMER, QUANTITATIVE: D-Dimer, Quant: 2.93 ug/mL-FEU — ABNORMAL HIGH (ref 0.00–0.50)

## 2021-06-22 LAB — APTT: aPTT: 31 seconds (ref 24–36)

## 2021-06-22 NOTE — Progress Notes (Signed)
Initial Nutrition Assessment  DOCUMENTATION CODES:   Not applicable  INTERVENTION:   If aggressive care desired, consider nutrition support  Magic cup TID with meals, each supplement provides 290 kcal and 9 grams of protein   NUTRITION DIAGNOSIS:   Increased nutrient needs related to acute illness as evidenced by estimated needs.  GOAL:   Patient will meet greater than or equal to 90% of their needs  MONITOR:   PO intake, Supplement acceptance, Weight trends, Labs, I & O's  REASON FOR ASSESSMENT:   Consult Assessment of nutrition requirement/status  ASSESSMENT:   Patient with PMH significant for COPD, chronic respiratory failure on 3L home oxygen, CHF, severe aortic regurgitation s/p TAVR, and CAD. Presents this admission with COVID PNA and aspiration PNA.  Patient hard of hearing. Unable to obtain nutrition history at this time. SLP evaluated patient this am and found patient is aspirating on all consistencies. She is high aspiration risk even with puree. Placed on D1 with honey thick liquids for comfort. Plan palliative meeting to discuss [place].   Weight noted to decline from 59.9 kg around one year ago to 44.5 kg on 04/08/21. Will need to obtain actual admission weight to assess for weight loss.   Medications: 500 mg vitamin C, decadron, 220 mg zinc sulfate Labs: reviewed   Diet Order:   Diet Order             DIET - DYS 1 Room service appropriate? Yes; Fluid consistency: Honey Thick  Diet effective now                   EDUCATION NEEDS:   Not appropriate for education at this time  Skin:  Skin Assessment: Reviewed RN Assessment  Last BM:  unknown  Height:   Ht Readings from Last 1 Encounters:  05/10/21 '5\' 4"'$  (1.626 m)    Weight:   Wt Readings from Last 1 Encounters:  04/08/21 44.5 kg   BMI:  There is no height or weight on file to calculate BMI.  Estimated Nutritional Needs:   Kcal:  1400-1600 kcal  Protein:  70-85 grams  Fluid:  >/=  1.4 L/day  Mariana Single MS, RD, LDN, CNSC Clinical Nutrition Pager listed in [place]

## 2021-06-22 NOTE — Sepsis Progress Note (Signed)
Duplicate order for code sepsis.  Original code ordered at 1610.

## 2021-06-22 NOTE — ED Notes (Signed)
Ghimire MD at bedside

## 2021-06-22 NOTE — ED Notes (Signed)
Attempted report x1. 

## 2021-06-22 NOTE — Progress Notes (Deleted)
Objective Swallowing Evaluation: Type of Study: Bedside Swallow Evaluation   Patient Details  Name: Jessica [name] MRN: [id number] Date of Birth: 30-Jul-1926  Today's Date: 06/22/2021 Time: SLP Start Time (ACUTE ONLY): 57 -SLP Stop Time (ACUTE ONLY): 1045  SLP Time Calculation (min) (ACUTE ONLY): 20 min   Past Medical History:  Past Medical History:  Diagnosis Date   Anxiety    congenital nystagmus    COPD (chronic obstructive pulmonary disease) (HCC)    Coronary artery disease    DJD (degenerative joint disease)    Fibromyalgia    Low back pain syndrome    Memory loss    Other and unspecified hyperlipidemia    Pancreatic lesion    Pneumonia    S/P TAVR (transcatheter aortic valve replacement)    26 mm Edwards Sapien 3 via the TF approach   Severe aortic stenosis    Venous insufficiency    Past Surgical History:  Past Surgical History:  Procedure Laterality Date   ABDOMINAL HYSTERECTOMY     ANTERIOR CERVICAL DISCECTOMY     APPENDECTOMY     CARPAL TUNNEL RELEASE  12/2011   right arm   CATARACT EXTRACTION     CORONARY ATHERECTOMY  07/23/2018   PTCA/orbital atherectomy/DES x 1 proximal to mid LAD   CORONARY ATHERECTOMY N/A 07/23/2018   Procedure: CORONARY ATHERECTOMY;  Surgeon: Burnell Blanks, MD;  Location: [facility];  Service: Cardiovascular;  Laterality: N/A;   CORONARY STENT INTERVENTION     CORONARY STENT INTERVENTION N/A 07/23/2018   Procedure: CORONARY STENT INTERVENTION;  Surgeon: Burnell Blanks, MD;  Location: [facility];  Service: Cardiovascular;  Laterality: N/A;   EYE SURGERY     INTRAMEDULLARY (IM) NAIL INTERTROCHANTERIC Right 09/19/2019   Procedure: INTRAMEDULLARY (IM) NAIL INTERTROCHANTRIC;  Surgeon: Renette Butters, MD;  Location: [facility];  Service: Orthopedics;  Laterality: Right;   LUMBAR LAMINECTOMY     right knee arthroscopy     right shoulder replacement  04/2009   right shoulder surgery  2008   Dr. Noemi Chapel    RIGHT/LEFT HEART CATH AND CORONARY ANGIOGRAPHY N/A 07/07/2018   Procedure: RIGHT/LEFT HEART CATH AND CORONARY ANGIOGRAPHY;  Surgeon: Burnell Blanks, MD;  Location: [facility];  Service: Cardiovascular;  Laterality: N/A;   TEE WITHOUT CARDIOVERSION N/A 03/29/2019   Procedure: TRANSESOPHAGEAL ECHOCARDIOGRAM (TEE);  Surgeon: Burnell Blanks, MD;  Location: [facility];  Service: Open Heart Surgery;  Laterality: N/A;   TRANSCATHETER AORTIC VALVE REPLACEMENT, TRANSFEMORAL N/A 03/29/2019   Procedure: TRANSCATHETER AORTIC VALVE REPLACEMENT, TRANSFEMORAL;  Surgeon: Burnell Blanks, MD;  Location: [facility];  Service: Open Heart Surgery;  Laterality: N/A;   ULTRASOUND GUIDANCE FOR VASCULAR ACCESS  07/07/2018   Procedure: Ultrasound Guidance For Vascular Access;  Surgeon: Burnell Blanks, MD;  Location: [facility];  Service: Cardiovascular;;   HPI: 87 year old female with past medical history of COPD, chronic respiratory failure, dysphagia, diastolic congestive heart failure (Echo 03/2020 EF 70-75%), generalized anxiety disorder, severe aortic regurgitation status post TAVR and coronary artery disease (cardiac cath 06/2018  with arthrectomy and DES to LAD) who presents to [facility] emergency department via EMS due to progressively worsening shortness of breath weakness and confusion. Per chart coughing when eating recently. MBS 2/20 sensed aspiration unable to clear, Dys 2/nectar recommended.   No data recorded   Assessment / Plan / Recommendation  CHL IP CLINICAL IMPRESSIONS 06/22/2021  Clinical Impression --  SLP Visit Diagnosis Dysphagia,  unspecified (R13.10)  Attention and concentration deficit following --  Frontal lobe and executive function deficit following --  Impact on safety and function Severe aspiration risk;Risk for inadequate nutrition/hydration      CHL IP TREATMENT RECOMMENDATION 06/22/2021  Treatment Recommendations Therapy as outlined in  treatment plan below     Prognosis 06/22/2021  Prognosis for Safe Diet Advancement (No Data)  Barriers to Reach Goals --  Barriers/Prognosis Comment --    CHL IP DIET RECOMMENDATION 12/17/2018  SLP Diet Recommendations --  Liquid Administration via --  Medication Administration --  Compensations Slow rate;Small sips/bites  Postural Changes --      CHL IP OTHER RECOMMENDATIONS 12/16/2018  Recommended Consults --  Oral Care Recommendations Oral care BID  Other Recommendations --      CHL IP FOLLOW UP RECOMMENDATIONS 06/22/2021  Follow up Recommendations (No Data)      CHL IP FREQUENCY AND DURATION 06/22/2021  Speech Therapy Frequency (ACUTE ONLY) min 1 x/week  Treatment Duration --           CHL IP ORAL PHASE 12/16/2018  Oral Phase WFL  Oral - Pudding Teaspoon --  Oral - Pudding Cup --  Oral - Honey Teaspoon --  Oral - Honey Cup --  Oral - Nectar Teaspoon --  Oral - Nectar Cup --  Oral - Nectar Straw --  Oral - Thin Teaspoon --  Oral - Thin Cup --  Oral - Thin Straw --  Oral - Puree --  Oral - Mech Soft --  Oral - Regular --  Oral - Multi-Consistency --  Oral - Pill --  Oral Phase - Comment --    CHL IP PHARYNGEAL PHASE 12/16/2018  Pharyngeal Phase Impaired  Pharyngeal- Pudding Teaspoon --  Pharyngeal --  Pharyngeal- Pudding Cup --  Pharyngeal --  Pharyngeal- Honey Teaspoon --  Pharyngeal --  Pharyngeal- Honey Cup --  Pharyngeal --  Pharyngeal- Nectar Teaspoon --  Pharyngeal --  Pharyngeal- Nectar Cup Delayed swallow initiation-vallecula;Delayed swallow initiation-pyriform sinuses;Penetration/Aspiration during swallow  Pharyngeal Material enters airway, remains ABOVE vocal cords then ejected out  Pharyngeal- Nectar Straw Delayed swallow initiation-vallecula;Delayed swallow initiation-pyriform sinuses;Penetration/Aspiration during swallow  Pharyngeal --  Pharyngeal- Thin Teaspoon --  Pharyngeal --  Pharyngeal- Thin Cup Delayed swallow  initiation-vallecula;Delayed swallow initiation-pyriform sinuses;Penetration/Aspiration during swallow;Penetration/Aspiration before swallow;Compensatory strategies attempted (with notebox)  Pharyngeal Material enters airway, CONTACTS cords and then ejected out;Material enters airway, passes BELOW cords then ejected out;Material enters airway, passes BELOW cords and not ejected out despite cough attempt by patient  Pharyngeal- Thin Straw Delayed swallow initiation-vallecula;Delayed swallow initiation-pyriform sinuses;Penetration/Aspiration before swallow;Penetration/Aspiration during swallow  Pharyngeal Material enters airway, CONTACTS cords and then ejected out;Material enters airway, passes BELOW cords then ejected out;Material enters airway, passes BELOW cords and not ejected out despite cough attempt by patient  Pharyngeal- Puree WFL  Pharyngeal --  Pharyngeal- Mechanical Soft WFL  Pharyngeal --  Pharyngeal- Regular WFL  Pharyngeal --  Pharyngeal- Multi-consistency --  Pharyngeal --  Pharyngeal- Pill Penetration/Aspiration before swallow  Pharyngeal Material enters airway, CONTACTS cords and then ejected out  Pharyngeal Comment --     CHL IP CERVICAL ESOPHAGEAL PHASE 12/16/2018  Cervical Esophageal Phase Impaired  Pudding Teaspoon --  Pudding Cup --  Honey Teaspoon --  Honey Cup --  Nectar Teaspoon --  Nectar Cup --  Nectar Straw --  Thin Teaspoon --  Thin Cup --  Thin Straw --  Puree --  Mechanical Soft --  Regular --  Multi-consistency --  Pill --  Cervical Esophageal Comment --     Houston Siren 06/22/2021, 12:15 PM   Orbie Pyo Colvin Caroli.Ed Risk analyst [phone] Office [phone]

## 2021-06-22 NOTE — Evaluation (Signed)
Bedside swallow evaluation  Patient Details Name: Jessica [name] MRN: [id number] DOB: 1926/08/07 Today's Date: 06/22/2021 Time: [id number] SLP Time Calculation (min) (ACUTE ONLY): 20 min  Problem List:  Patient Active Problem List   Diagnosis Date Noted   COVID-19 virus infection 06/21/2021   Aspiration pneumonia of right lung due to gastric secretions ([place]) 0000000   Acute metabolic encephalopathy 0000000   Lactic acidosis 06/21/2021   Sepsis with organ dysfunction ([place]) 06/21/2021   Dysphagia 06/21/2021   Pressure injury of skin 09/19/2019   Acute right hip pain 09/17/2019   S/P TAVR (transcatheter aortic valve replacement)    Acute on chronic respiratory failure with hypoxia ([place]) 12/15/2018   Depression 12/15/2018   Coronary artery disease involving native coronary artery of native heart without angina pectoris 12/15/2018   Chronic diastolic CHF (congestive heart failure) ([place]) 12/15/2018   History of pulmonary embolism 08/25/2018   Severe aortic stenosis    Aortic atherosclerosis ([place]) 05/20/2018   Insomnia 04/08/2016   Chronic obstructive pulmonary disease ([place]) 05/07/2015   Labial cyst 05/07/2015   Exertional dyspnea 02/26/2015   Vitamin D deficiency 01/14/2009   Mixed hyperlipidemia 01/14/2009   DEGENERATIVE JOINT DISEASE 06/12/2008   Fibromyalgia 12/15/2007   Generalized anxiety disorder 12/14/2007   Past Medical History:  Past Medical History:  Diagnosis Date   Anxiety    congenital nystagmus    COPD (chronic obstructive pulmonary disease) (HCC)    Coronary artery disease    DJD (degenerative joint disease)    Fibromyalgia    Low back pain syndrome    Memory loss    Other and unspecified hyperlipidemia    Pancreatic lesion    Pneumonia    S/P TAVR (transcatheter aortic valve replacement)    26 mm Edwards Sapien 3 via the TF approach   Severe aortic stenosis    Venous insufficiency    Past Surgical History:  Past Surgical History:  Procedure  Laterality Date   ABDOMINAL HYSTERECTOMY     ANTERIOR CERVICAL DISCECTOMY     APPENDECTOMY     CARPAL TUNNEL RELEASE  12/2011   right arm   CATARACT EXTRACTION     CORONARY ATHERECTOMY  07/23/2018   PTCA/orbital atherectomy/DES x 1 proximal to mid LAD   CORONARY ATHERECTOMY N/A 07/23/2018   Procedure: CORONARY ATHERECTOMY;  Surgeon: Burnell Blanks, MD;  Location: [facility];  Service: Cardiovascular;  Laterality: N/A;   CORONARY STENT INTERVENTION     CORONARY STENT INTERVENTION N/A 07/23/2018   Procedure: CORONARY STENT INTERVENTION;  Surgeon: Burnell Blanks, MD;  Location: [facility];  Service: Cardiovascular;  Laterality: N/A;   EYE SURGERY     INTRAMEDULLARY (IM) NAIL INTERTROCHANTERIC Right 09/19/2019   Procedure: INTRAMEDULLARY (IM) NAIL INTERTROCHANTRIC;  Surgeon: Renette Butters, MD;  Location: [facility];  Service: Orthopedics;  Laterality: Right;   LUMBAR LAMINECTOMY     right knee arthroscopy     right shoulder replacement  04/2009   right shoulder surgery  2008   Dr. Noemi Chapel   RIGHT/LEFT HEART CATH AND CORONARY ANGIOGRAPHY N/A 07/07/2018   Procedure: RIGHT/LEFT HEART CATH AND CORONARY ANGIOGRAPHY;  Surgeon: Burnell Blanks, MD;  Location: [facility];  Service: Cardiovascular;  Laterality: N/A;   TEE WITHOUT CARDIOVERSION N/A 03/29/2019   Procedure: TRANSESOPHAGEAL ECHOCARDIOGRAM (TEE);  Surgeon: Burnell Blanks, MD;  Location: [facility];  Service: Open Heart Surgery;  Laterality: N/A;   TRANSCATHETER AORTIC VALVE REPLACEMENT, TRANSFEMORAL N/A 03/29/2019   Procedure:  TRANSCATHETER AORTIC VALVE REPLACEMENT, TRANSFEMORAL;  Surgeon: Burnell Blanks, MD;  Location: [facility];  Service: Open Heart Surgery;  Laterality: N/A;   ULTRASOUND GUIDANCE FOR VASCULAR ACCESS  07/07/2018   Procedure: Ultrasound Guidance For Vascular Access;  Surgeon: Burnell Blanks, MD;  Location: [facility];  Service: Cardiovascular;;   HPI:   87 year old female with past medical history of COPD, chronic respiratory failure, dysphagia, diastolic congestive heart failure (Echo 03/2020 EF 70-75%), generalized anxiety disorder, severe aortic regurgitation status post TAVR and coronary artery disease (cardiac cath 06/2018  with arthrectomy and DES to LAD) who presents to [facility] emergency department via EMS due to progressively worsening shortness of breath weakness and confusion. Per chart coughing when eating recently. MBS 2/20 sensed aspiration unable to clear, Dys 2/nectar recommended.   Assessment / Plan / Recommendation Clinical Impression  Pt is very hard of hearing although if audible she can follow command. At rest she has open mouth posture, xerostomia. She exhibited discoordination wtih oral acceptance/closing mouth around spoon and propulsion with applesauce and 1/3 teaspoon water. Highly suspect aspiration with all consistencies indicated by immediate and delayed coughs, wet vocal quality. Laryngeal elevation was decreased subjectively wtih observing and palpation with multiple swallows. RR and SpO2 were in normal range but dyspneic post swallows given COVID. She is a high aspiration risk even with puree. Recommend continue NPO. ST plan to follow up Monday for plan (instrumental assessment or will there be discussion re: code status with MD and possibly comfort feeds? Continue oral care    SLP Assessment  SLP Visit Diagnosis: Dysphagia, unspecified (R13.10)    Follow Up Recommendations   (TBD)    Frequency and Duration min 1 x/week         SLP Evaluation Cognition  Orientation Level: Disoriented to person;Disoriented to time;Disoriented to place;Disoriented to situation       Comprehension       Expression     Oral / Motor  Oral Motor/Sensory Function Overall Oral Motor/Sensory Function: Generalized oral weakness   GO                    Houston Siren 06/22/2021, 12:21 PM

## 2021-06-22 NOTE — Progress Notes (Signed)
06/22/2021 Patient transfer from the emergency room to [place] at 1400. She is alert to self. Patient skin was assess she stage 1 on right buttock and stage 2 to coccyx. Foam dressing was placed on buttocks. She was placed on a progressive monitor and central monitor was aware patient was on unit. Rico Sheehan RN

## 2021-06-22 NOTE — ED Notes (Signed)
Placed Breakfast Order 

## 2021-06-22 NOTE — ED Triage Notes (Signed)
Pt arrived via [facility] from home. Per EMS, they were requested for severe respiratory distress which has improved greatly throughout their care. EMS reports tachypnea, low spo2, and tachycardia on initial contact. Pt is on 2L O2 via [place] at baseline and does home neb txs. Pt c/o SOB worsening since 1030 this morning which had no improvement with neb tx x2. EMS further reports pt's family advised pt has had COVID exposure approx. 7 days ago.

## 2021-06-22 NOTE — Progress Notes (Signed)
PROGRESS NOTE    Caton Wohlwend [facility]  [id number] DOB: May 04, 1926 DOA: 06/21/2021 PCP: [name], [name], MD    Brief Narrative:  87 year old with chronic debility, COPD and chronic hypoxemic respiratory failure on 3 L oxygen at home, chronic aspiration with dysphagia, diastolic heart failure, generalized anxiety disorder, severe aortic regurgitation and status post TAVR and coronary artery disease brought to the emergency room with progressive worsening shortness of breath, weakness and confusion.  Patient lives with her daughter who developed COVID-19 infection about 9 days ago and improved.  Patient started symptoms about 2 to 3 days ago with increasing lethargic.  In the emergency room, COVID-19 positive.  Chest x-ray with bilateral pneumonia right more than left.  Temperature 104.9.  Lactic acid 2.5.  Resuscitated with IV fluids.  Started on antibiotics and antiviral therapies and admitted to the hospital.   Assessment & Plan:   Principal Problem:   Acute on chronic respiratory failure with hypoxia (HCC) Active Problems:   Mixed hyperlipidemia   Coronary artery disease involving native coronary artery of native heart without angina pectoris   Chronic diastolic CHF (congestive heart failure) ([place])   COVID-19 virus infection   Aspiration pneumonia of right lung due to gastric secretions (HCC)   Acute metabolic encephalopathy   Lactic acidosis   Sepsis with organ dysfunction ([place])   Dysphagia  Acute on chronic hypoxemic respiratory failure uses 3 L oxygen at home. Pneumonia due to COVID-19 virus infection: Sepsis present on admission with organ dysfunction. Continue to monitor due to significant symptoms  chest physiotherapy, incentive spirometry, deep breathing exercises, sputum induction, mucolytic's and bronchodilators. Supplemental oxygen to keep saturations more than 90%. Covid directed therapy with , steroids, on dexamethasone remdesivir, day 2/5 antibiotics, on Rocephin and  azithromycin.  Suspected significant aspiration. Due to severity of symptoms, patient will need daily inflammatory markers, chest x-rays, liver function test to monitor and direct COVID-19 therapies.  Suspected aspiration pneumonia: Right more than left. Patient with chronic dysphagia and aspiration.  At home she is on nectar thick liquid. Seen by speech therapy, high risk of aspiration with obvious evidence of aspiration on attempted feed.  See goal of care discussion below.  Will allow honey thick liquid and pured diet with supervision. On Rocephin and azithromycin to cover for aspiration pneumonia.  Acute metabolic encephalopathy: Due to above.  We will continue to treat for infection and metabolic derangements.  Chronic diastolic heart failure: Well compensated.  Dehydrated now.  On maintenance IV fluids.  Coronary artery disease: No evidence of ischemia.  Currently on a statin and aspirin that we will continue.  Goal of care: Discussed in detail with patient's daughter on the phone about CODE STATUS and recommended DNR/DNI that she agreed on.  Patient desires to have all other treatment options available to be given. Patient is obviously aspirating all consistencies, she is asking for water.  Discussed with patient's daughter that giving any food or liquid is potentially dangerous, however it is not ethical to deprive her from eating for her comfort.  We decided to change her CODE STATUS to DNR/DNI and start trying honey thick liquids and pured diet with continuous close monitoring. Patient with extreme debility and lethargic with ongoing infections, baseline poor health status.  Will involve palliative care.  DVT prophylaxis: enoxaparin (LOVENOX) injection 30 mg Start: 06/22/21 1000   Code Status: DNR/DNI Family Communication: Daughter on the phone Disposition Plan: Status is: Inpatient  Remains inpatient appropriate because:IV treatments appropriate due to intensity of illness  or  inability to take PO and Inpatient level of care appropriate due to severity of illness  Dispo: The patient is from: Home              Anticipated d/c is to:  Unknown              Patient currently is not medically stable to d/c.   Difficult to place patient No         Consultants:  None  Procedures:  None  Antimicrobials:  Rocephin, azithromycin, remdesivir 8/26----   Subjective: Patient seen and examined.  She was still in the emergency room.  Difficult to appreciate her conversation because of dry mouth and hard of hearing.  Looks fairly comfortable.  She was asking for water.  Producing large amount of mucoid and sticky sputum. Patient was on 10 L of oxygen on my evaluation, 100% saturation, I tapered off to 5 L and she maintained good saturation.  Objective: Vitals:   06/22/21 1215 06/22/21 1230 06/22/21 1245 06/22/21 1322  BP: 125/68 104/90 115/66   Pulse: 67 64 72   Resp: (!) 26 (!) 24 (!) 23   Temp:    97.9 F (36.6 C)  TempSrc:    Oral  SpO2: 97% 98% 99%     Intake/Output Summary (Last 24 hours) at 06/22/2021 1340 Last data filed at 06/22/2021 0957 Gross per 24 hour  Intake 4240.71 ml  Output --  Net 4240.71 ml   There were no vitals filed for this visit.  Examination:  General: Extremely debilitated and cachectic, sick looking lady on 5 L oxygen. Cardiovascular: S1-S2 normal.  Regular rate rhythm. Respiratory: Bilateral conducted upper airway sounds.  No added sounds.  Currently on 5 L oxygen. Gastrointestinal: Soft and nontender.  Bowel sounds present. Ext: No swelling or edema. Neuro: Alert and awake but not oriented.  Difficult to understand conversation.  Generalized weakness but no focal deficits.    Data Reviewed: I have personally reviewed following labs and imaging studies  CBC: Recent Labs  Lab 06/21/21 1619 06/21/21 1800 06/22/21 0241  WBC 6.9  --  9.1  NEUTROABS 5.9  --  8.6*  HGB 11.2* 11.2* 9.3*  HCT 36.1 33.0* 30.5*  MCV  99.2  --  100.7*  PLT 342  --  99991111   Basic Metabolic Panel: Recent Labs  Lab 06/21/21 1619 06/21/21 1800 06/22/21 0241  NA 141 141 141  K 4.7 4.2 4.1  CL 102  --  106  CO2 25  --  26  GLUCOSE 113*  --  173*  BUN 31*  --  28*  CREATININE 0.75  --  0.74  CALCIUM 9.5  --  8.6*  MG  --   --  1.8   GFR: CrCl cannot be calculated (Unknown ideal weight.). Liver Function Tests: Recent Labs  Lab 06/21/21 1619 06/22/21 0241  AST 39 28  ALT 22 18  ALKPHOS 129* 105  BILITOT 1.4* 0.7  PROT 6.9 6.1*  ALBUMIN 2.8* 2.3*   No results for input(s): LIPASE, AMYLASE in the last 168 hours. No results for input(s): AMMONIA in the last 168 hours. Coagulation Profile: Recent Labs  Lab 06/21/21 1636 06/22/21 0241  INR 1.0 1.1   Cardiac Enzymes: No results for input(s): CKTOTAL, CKMB, CKMBINDEX, TROPONINI in the last 168 hours. BNP (last 3 results) No results for input(s): PROBNP in the last 8760 hours. HbA1C: No results for input(s): HGBA1C in the last 72 hours. CBG: No results for  input(s): GLUCAP in the last 168 hours. Lipid Profile: No results for input(s): CHOL, HDL, LDLCALC, TRIG, CHOLHDL, LDLDIRECT in the last 72 hours. Thyroid Function Tests: No results for input(s): TSH, T4TOTAL, FREET4, T3FREE, THYROIDAB in the last 72 hours. Anemia Panel: No results for input(s): VITAMINB12, FOLATE, FERRITIN, TIBC, IRON, RETICCTPCT in the last 72 hours. Sepsis Labs: Recent Labs  Lab 06/21/21 1610 06/21/21 1810 06/22/21 0241  PROCALCITON  --   --  7.83  LATICACIDVEN 2.5* 1.6  --     Recent Results (from the past 240 hour(s))  Blood Culture (routine x 2)     Status: None (Preliminary result)   Collection Time: 06/21/21  4:30 PM   Specimen: BLOOD  Result Value Ref Range Status   Specimen Description BLOOD BLOOD RIGHT FOREARM  Final   Special Requests   Final    BOTTLES DRAWN AEROBIC AND ANAEROBIC Blood Culture adequate volume   Culture   Final    NO GROWTH < 24  HOURS Performed at [facility], [place] [address] [place], [place] [address]    Report Status PENDING  Incomplete  Blood Culture (routine x 2)     Status: None (Preliminary result)   Collection Time: 06/21/21  4:35 PM   Specimen: BLOOD  Result Value Ref Range Status   Specimen Description BLOOD RIGHT ANTECUBITAL  Final   Special Requests   Final    BOTTLES DRAWN AEROBIC AND ANAEROBIC Blood Culture results may not be optimal due to an excessive volume of blood received in culture bottles   Culture   Final    NO GROWTH < 24 HOURS Performed at [facility], [place] [address], [place], [place] [address]    Report Status PENDING  Incomplete  Resp Panel by RT-PCR (Flu A&B, Covid) Nasopharyngeal Swab     Status: Abnormal   Collection Time: 06/21/21  4:40 PM   Specimen: Nasopharyngeal Swab; Nasopharyngeal(NP) swabs in vial transport medium  Result Value Ref Range Status   SARS Coronavirus 2 by RT PCR POSITIVE (A) NEGATIVE Final    Comment: RESULT CALLED TO, READ BACK BY AND VERIFIED WITH: C,MAS '@[id number]'$  06/21/21 EB (NOTE) SARS-CoV-2 target nucleic acids are DETECTED.  The SARS-CoV-2 RNA is generally detectable in upper respiratory specimens during the acute phase of infection. Positive results are indicative of the presence of the identified virus, but do not rule out bacterial infection or co-infection with other pathogens not detected by the test. Clinical correlation with patient history and other diagnostic information is necessary to determine patient infection status. The expected result is Negative.  Fact Sheet for Patients: EntrepreneurPulse.com.au  Fact Sheet for Healthcare Providers: IncredibleEmployment.be  This test is not yet approved or cleared by the [place] FDA and  has been authorized for detection and/or diagnosis of SARS-CoV-2 by FDA under an Emergency Use Authorization (EUA).  This EUA will remain in effect (meaning  this test can be used) for  the duration of  the COVID-19 declaration under Section 564(b)(1) of the Act, 21 U.S.C. section 360bbb-3(b)(1), unless the authorization is terminated or revoked sooner.     Influenza A by PCR NEGATIVE NEGATIVE Final   Influenza B by PCR NEGATIVE NEGATIVE Final    Comment: (NOTE) The Xpert Xpress SARS-CoV-2/FLU/RSV plus assay is intended as an aid in the diagnosis of influenza from Nasopharyngeal swab specimens and should not be used as a sole basis for treatment. Nasal washings and aspirates are unacceptable for Xpert Xpress SARS-CoV-2/FLU/RSV testing.  Fact Sheet for Patients: EntrepreneurPulse.com.au  Fact Sheet for Healthcare Providers: IncredibleEmployment.be  This test is not yet approved or cleared by the [place] FDA and has been authorized for detection and/or diagnosis of SARS-CoV-2 by FDA under an Emergency Use Authorization (EUA). This EUA will remain in effect (meaning this test can be used) for the duration of the COVID-19 declaration under Section 564(b)(1) of the Act, 21 U.S.C. section 360bbb-3(b)(1), unless the authorization is terminated or revoked.  Performed at [facility], [place] [address], [place], [place] [address]          Radiology Studies: DG Chest [place] 1 View  Result Date: 06/21/2021 CLINICAL DATA:  87 year old female with questionable sepsis EXAM: PORTABLE CHEST 1 VIEW COMPARISON:  02/03/2021 FINDINGS: Cardiomediastinal silhouette unchanged in size and contour. No evidence of central vascular congestion. No interlobular septal thickening. Changes of prior TAVR again noted. Increased reticulonodular opacities of the right lung. No pneumothorax or pleural effusion. No pneumothorax or pleural effusion. No acute displaced fracture. Surgical changes of the right glenohumeral joint. Chronic deformity of the right clavicle. IMPRESSION: Increased right-sided reticulonodular opacities,  concerning for multifocal infection or possibly aspiration. Surgical changes of prior TAVR. Electronically Signed   By: Corrie Mckusick D.O.   On: 06/21/2021 16:37        Scheduled Meds:  albuterol  2 puff Inhalation Q6H   vitamin C  500 mg Oral Daily   aspirin EC  81 mg Oral Daily   dexamethasone (DECADRON) injection  6 mg Intravenous Q24H   enoxaparin (LOVENOX) injection  30 mg Subcutaneous Q24H   escitalopram  20 mg Oral Daily   fluticasone  2 spray Each Nare Daily   fluticasone furoate-vilanterol  1 puff Inhalation Daily   simvastatin  20 mg Oral q1800   zinc sulfate  220 mg Oral Daily   Continuous Infusions:  sodium chloride 100 mL/hr at 06/22/21 1319   azithromycin Stopped (06/22/21 0155)   cefTRIAXone (ROCEPHIN)  IV Stopped (06/22/21 0155)   remdesivir 100 mg in NS 100 mL 100 mg (06/22/21 1321)     LOS: 1 day    Time spent: 35 minutes    Barb Merino, MD [facility] Pager [phone]

## 2021-06-22 NOTE — ED Notes (Signed)
Speech and RT at bedside to evaluate pt

## 2021-06-22 NOTE — ED Notes (Signed)
Went in to reassess patient, was coughing up thick brown mucus, did suction the mouth and back of throat

## 2021-06-22 NOTE — ED Notes (Signed)
Pt removed venti mask again. Called RT to see if pt can be placed on high flow/salter instead. RT to come assess pt.

## 2021-06-23 DIAGNOSIS — Z66 Do not resuscitate: Secondary | ICD-10-CM

## 2021-06-23 DIAGNOSIS — Z515 Encounter for palliative care: Secondary | ICD-10-CM

## 2021-06-23 DIAGNOSIS — R634 Abnormal weight loss: Secondary | ICD-10-CM

## 2021-06-23 LAB — CBC WITH DIFFERENTIAL/PLATELET
Abs Immature Granulocytes: 0.07 10*3/uL (ref 0.00–0.07)
Basophils Absolute: 0 10*3/uL (ref 0.0–0.1)
Basophils Relative: 0 %
Eosinophils Absolute: 0 10*3/uL (ref 0.0–0.5)
Eosinophils Relative: 0 %
HCT: 30.6 % — ABNORMAL LOW (ref 36.0–46.0)
Hemoglobin: 9.7 g/dL — ABNORMAL LOW (ref 12.0–15.0)
Immature Granulocytes: 1 %
Lymphocytes Relative: 9 %
Lymphs Abs: 0.7 10*3/uL (ref 0.7–4.0)
MCH: 30.9 pg (ref 26.0–34.0)
MCHC: 31.7 g/dL (ref 30.0–36.0)
MCV: 97.5 fL (ref 80.0–100.0)
Monocytes Absolute: 0.4 10*3/uL (ref 0.1–1.0)
Monocytes Relative: 5 %
Neutro Abs: 7.1 10*3/uL (ref 1.7–7.7)
Neutrophils Relative %: 85 %
Platelets: 324 10*3/uL (ref 150–400)
RBC: 3.14 MIL/uL — ABNORMAL LOW (ref 3.87–5.11)
RDW: 14.3 % (ref 11.5–15.5)
WBC: 8.4 10*3/uL (ref 4.0–10.5)
nRBC: 0 % (ref 0.0–0.2)

## 2021-06-23 LAB — COMPREHENSIVE METABOLIC PANEL
ALT: 16 U/L (ref 0–44)
AST: 26 U/L (ref 15–41)
Albumin: 2.3 g/dL — ABNORMAL LOW (ref 3.5–5.0)
Alkaline Phosphatase: 93 U/L (ref 38–126)
Anion gap: 8 (ref 5–15)
BUN: 31 mg/dL — ABNORMAL HIGH (ref 8–23)
CO2: 26 mmol/L (ref 22–32)
Calcium: 8.9 mg/dL (ref 8.9–10.3)
Chloride: 106 mmol/L (ref 98–111)
Creatinine, Ser: 0.64 mg/dL (ref 0.44–1.00)
GFR, Estimated: >60 mL/min (ref >60)
Glucose, Bld: 104 mg/dL — ABNORMAL HIGH (ref 70–99)
Potassium: 3.3 mmol/L — ABNORMAL LOW (ref 3.5–5.1)
Sodium: 140 mmol/L (ref 135–145)
Total Bilirubin: 0.4 mg/dL (ref 0.3–1.2)
Total Protein: 6.3 g/dL — ABNORMAL LOW (ref 6.5–8.1)

## 2021-06-23 LAB — URINE CULTURE: Culture: NO GROWTH

## 2021-06-23 LAB — D-DIMER, QUANTITATIVE: D-Dimer, Quant: 2.96 ug/mL-FEU — ABNORMAL HIGH (ref 0.00–0.50)

## 2021-06-23 LAB — MAGNESIUM: Magnesium: 1.9 mg/dL (ref 1.7–2.4)

## 2021-06-23 LAB — C-REACTIVE PROTEIN: CRP: 8.9 mg/dL — ABNORMAL HIGH (ref <1.0)

## 2021-06-23 MED ORDER — POTASSIUM CHLORIDE CRYS ER 20 MEQ PO TBCR
40.0000 meq | EXTENDED_RELEASE_TABLET | Freq: Every day | ORAL | Status: DC
  Administered 2021-06-23 – 2021-07-02 (×9): 40 meq via ORAL
  Filled 2021-06-23 (×10): qty 2

## 2021-06-23 NOTE — Consult Note (Signed)
Consultation Note Date: 06/23/2021   Patient Name: ZALA DINALLO  DOB: [birth year]  MRN: [id number]  Age / Sex: 87 y.o., female  PCP: [name], [name], MD Referring Physician: Nita Sells, MD  Reason for Consultation: Establishing goals of care and Psychosocial/spiritual support  HPI/Patient Profile: 87 y.o. female  admitted on 06/21/2021 with significant past medical history of COPD, chronic respiratory failure, dysphagia, diastolic congestive heart failure (Echo 03/2020 EF 70-75%), generalized anxiety disorder, severe aortic regurgitation status post TAVR and coronary artery disease (cardiac cath 06/2018  with arthrectomy and DES to LAD) who presents to [facility] emergency department via EMS due to progressively worsening shortness of breath weakness and confusion.  Patient is  a poor historian at this point secondary to metabolic encephalopathy.     Daughter explains that her and the rest of the family were COVID-positive over the past week.  She explains that her mother is vaccinated and boosted for COVID-19 but unfortunately she frequently shares a room with her mother over the span of time.  Approximately 2 to 3 days ago she noticed that her mother was beginning to exhibit increasing generalized weakness with associated increasing lethargy.  Shortly thereafter the patient began to exhibit worsening shortness of breath.  She states that over the span of the next few days she gave her mother frequent breathing treatments without improvement in her worsening shortness of breath.  She denies that her mother had any fevers in the days preceding her presentation.  Upon evaluation in the emergency department patient was found to be positive for COVID-19.  Chest x-ray was additionally performed and found to have right-sided pulmonary infiltrates, worse in the right lower lobe.  Patient was initially  found to exhibit multiple SIRS criteria including markedly elevated fevers as high as 104.92F .  Patient was additionally found to exhibit a lactic acidosis of 2.5.  Patient was initiated on isotonic intravenous fluids and given broad-spectrum intravenous antibiotics.   Daughter reports patient has had increasing difficulty with swallowing and a 25 pound weight loss over the last 9 months.  Her diet at home is strictly pured    Due to patient progressively worsening lethargy confusion and weakness EMS was contacted who promptly came to evaluate the patient and brought her into [facility] emergency department for evaluation.   Patient admitted for continued evaluation, treatment and stabilization.  Family face treatment option decisions, advanced directive decisions and anticipatory care needs.    Clinical Assessment and Goals of Care:  This NP Wadie Lessen reviewed medical records, received report from nursing team, and then spoke to daughter/Sonja Setzer by telephone   to discuss diagnosis, prognosis, GOC, EOL wishes disposition and options.  The above conversation was completed via telephone due to  restrictions during the COVID-19 pandemic. Thorough chart review and discussion with necessary members of the care team was completed as part of assessment. All issues were discussed and addressed but no physical exam was performed.    Concept of Palliative Care was introduced as  specialized medical care for people and their families living with serious illness.  If focuses on providing relief from the symptoms and stress of a serious illness.  The goal is to improve quality of life for both the patient and the family.  Values and goals of care important to patient and family were attempted to be elicited.  Created space and opportunity for family to explore thoughts and feelings regarding current medical situation.  Daughter verbalizes an understanding of the patient's fragility secondary  to advanced age and multiple comorbidities.    The difference between a aggressive medical intervention path  and a palliative comfort care path for this patient at this time was had.   Family open to treat the treatable and remain  hopeful for improvement,   A  discussion was had today regarding advanced directives.  Concepts specific to code status, artifical feeding and hydration, continued IV antibiotics and rehospitalization was had.       Questions and concerns addressed.  Patient  encouraged to call with questions or concerns.     PMT will continue to support holistically.   Meeting is planned for tomorrow at noon.  This nurse practitioner will meet daughter at the bedside.            NEXT OF KIN    SUMMARY OF RECOMMENDATIONS    Code Status/Advance Care Planning: DNR   Palliative Prophylaxis:  Aspiration, Bowel Regimen, Delirium Protocol, Frequent Pain Assessment, and Oral Care  Additional Recommendations (Limitations, Scope, Preferences): Full Scope Treatment  Psycho-social/Spiritual:  Desire for further Chaplaincy support:  no-declined   Prognosis:  Unable to determine  Discharge Planning: To Be Determined      Primary Diagnoses: Present on Admission:  COVID-19 virus infection  Aspiration pneumonia of right lung due to gastric secretions (HCC)  Acute metabolic encephalopathy  Lactic acidosis  Chronic diastolic CHF (congestive heart failure) ([place])  Sepsis with organ dysfunction ([place])   I have reviewed the medical record, interviewed the patient and family, and examined the patient. The following aspects are pertinent.  Past Medical History:  Diagnosis Date   Anxiety    congenital nystagmus    COPD (chronic obstructive pulmonary disease) (HCC)    Coronary artery disease    DJD (degenerative joint disease)    Fibromyalgia    Low back pain syndrome    Memory loss    Other and unspecified hyperlipidemia    Pancreatic lesion    Pneumonia     S/P TAVR (transcatheter aortic valve replacement)    26 mm Edwards Sapien 3 via the TF approach   Severe aortic stenosis    Venous insufficiency    Social History   Socioeconomic History   Marital status: Widowed    Spouse name: Not on file   Number of children: 2   Years of education: Not on file   Highest education level: Not on file  Occupational History   Occupation: Still works,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  Tobacco Use   Smoking status: Former    Packs/day: 2.00    Years: 30.00    Pack years: 60.00    Types: Cigarettes    Quit date: 10/28/1991    Years since quitting: 29.6   Smokeless tobacco: Never  Vaping Use   Vaping Use: Never used  Substance and Sexual Activity   Alcohol use: Not Currently    Comment: 1 drink per night   Drug use: No   Sexual activity: Not Currently  Other Topics Concern   Not  on file  Social History Narrative   9 siblings   Social Determinants of Radio broadcast assistant Strain: Not on file  Food Insecurity: Not on file  Transportation Needs: Not on file  Physical Activity: Not on file  Stress: Not on file  Social Connections: Not on file   Family History  Problem Relation Age of Onset   Other Mother        broken hip   Other Father        kidney problems   Colon cancer Neg Hx    Stomach cancer Neg Hx    Rectal cancer Neg Hx    Pancreatic cancer Neg Hx    Scheduled Meds:  albuterol  2 puff Inhalation Q6H   vitamin C  500 mg Oral Daily   aspirin EC  81 mg Oral Daily   dexamethasone (DECADRON) injection  6 mg Intravenous Q24H   enoxaparin (LOVENOX) injection  30 mg Subcutaneous Q24H   escitalopram  20 mg Oral Daily   fluticasone  2 spray Each Nare Daily   fluticasone furoate-vilanterol  1 puff Inhalation Daily   potassium chloride  40 mEq Oral Daily   simvastatin  20 mg Oral q1800   zinc sulfate  220 mg Oral Daily   Continuous Infusions:  azithromycin 500 mg (06/22/21 2300)   cefTRIAXone (ROCEPHIN)  IV 2 g (06/22/21 2202)    remdesivir 100 mg in NS 100 mL 100 mg (06/22/21 1321)   PRN Meds:.acetaminophen, albuterol, ALPRAZolam, guaiFENesin-dextromethorphan, ondansetron **OR** ondansetron (ZOFRAN) IV, polyethylene glycol Medications Prior to Admission:  Prior to Admission medications   Medication Sig Start Date End Date Taking? Authorizing Provider  albuterol (PROVENTIL) (2.5 MG/3ML) 0.083% nebulizer solution USE 1 VIAL IN NEBULIZER 6 TIMES DAILY - (for rescue) (Max 30 doses per month) 08/30/20  Yes Kasa, Kurian, MD  albuterol (VENTOLIN HFA) 108 (90 Base) MCG/ACT inhaler INHALE 2 PUFFS BY MOUTH EVERY 6 HOURS AS NEEDED FOR WHEEZING 10/05/20  Yes Kasa, Maretta Bees, MD  ALPRAZolam Duanne Moron) 0.5 MG tablet Take 1 tablet (0.5 mg total) by mouth 2 (two) times daily as needed for anxiety. 05/10/21  Yes [name], [name], MD  AMBULATORY NON FORMULARY MEDICATION Medication Name: Incentive spirometer Use 10-15 times per day 12/14/18  Yes Flora Lipps, MD  aspirin EC 81 MG tablet Take 1 tablet (81 mg total) by mouth daily. 04/01/19  Yes [name] R, PA-C  BROVANA 15 MCG/2ML NEBU USE 1 VIAL  IN  NEBULIZER TWICE  DAILY - morning and evening 11/26/20  Yes Kasa, Kurian, MD  budesonide (PULMICORT) 0.5 MG/2ML nebulizer solution USE 1 VIAL  IN  NEBULIZER TWICE  DAILY - Rinse mouth after treatment 11/26/20  Yes Kasa, Maretta Bees, MD  escitalopram (LEXAPRO) 20 MG tablet TAKE 1 TABLET BY MOUTH EVERY DAY 04/08/21  Yes [name], [name], MD  fluticasone (FLONASE) 50 MCG/ACT nasal spray Place 2 sprays into both nostrils daily. 12/18/18  Yes Ghimire, Henreitta Leber, MD  HYDROcodone-acetaminophen (NORCO) 5-325 MG tablet Take 0.5 tablets by mouth every 12 (twelve) hours as needed for severe pain. 05/10/21  Yes [name], [name], MD  Respiratory Therapy Supplies (FLUTTER) DEVI 1 each by Does not apply route daily. 12/14/18  Yes Kasa, Maretta Bees, MD  senna-docusate (SENOKOT-S) 8.6-50 MG tablet Take 2 tablets by mouth 2 (two) times daily as needed for mild constipation or moderate  constipation. 09/21/19  Yes Gonfa, Charlesetta Ivory, MD  tiZANidine (ZANAFLEX) 2 MG tablet TAKE 0.5-1 TABLETS (1-2 MG TOTAL) BY MOUTH  AT BEDTIME AS NEEDED FOR MUSCLE SPASMS. 09/24/20  Yes [name], [name], MD  vitamin B-12 (CYANOCOBALAMIN) 1000 MCG tablet Take 1,000 mcg by mouth daily.   Yes [provider]  predniSONE (STERAPRED UNI-PAK 21 TAB) 10 MG (21) TBPK tablet Use as directed. Patient not taking: No sig reported 05/15/21   Tyler Pita, MD   Allergies  Allergen Reactions   Contrast Media [Iodinated Diagnostic Agents] Itching, Rash and Other (See Comments)    Hypotension, Skin turns red like a sunburn   Iohexol Itching, Rash and Other (See Comments)    Hypotension, Skin turns red like a sunburn   Review of Systems  Unable to perform ROS: Other   Physical Exam  Vital Signs: BP (!) 152/53 (BP Location: Left Arm)   Pulse 61   Temp 98.6 F (37 C) (Oral)   Resp (!) 21   SpO2 100%  Pain Scale: 0-10   Pain Score: 0-No pain   SpO2: SpO2: 100 % O2 Device:SpO2: 100 % O2 Flow Rate: .O2 Flow Rate (L/min): 5 L/min  IO: Intake/output summary:  Intake/Output Summary (Last 24 hours) at 06/23/2021 0750 Last data filed at 06/23/2021 0442 Gross per 24 hour  Intake 740.71 ml  Output 100 ml  Net 640.71 ml    LBM:   Baseline Weight:   Most recent weight:       Palliative Assessment/Data: 30 % at best   Discussed with Dr Verlon Au  Time In: 1030 Time Out: 1140 Time Total: 70 minutes Greater than 50%  of this time was spent counseling and coordinating care related to the above assessment and plan.  Signed by: Wadie Lessen, NP   Please contact Palliative Medicine Team phone at [phone] for questions and concerns.  For individual provider: See Shea Evans

## 2021-06-23 NOTE — Progress Notes (Addendum)
PROGRESS NOTE   Jessica [name] [facility]  [id number] DOB: 06/10/1926 DOA: 06/21/2021 PCP: [name], [name], MD  Brief Narrative:  87 year old white female prior aortic valve stenosis status post TAVR 03/30/2019--aortic atherosclerosis based on CT 2016 status post DES 3X 16 2019 Dr. [name] (allergy to contrast dye), moderate COPD emphysema prior smoking followed by pulmonology, mild memory loss, HLD, previous right Fem # 09/25/21, right lung pulmonary embolism with right lower extremity DVT previously on Eliquis,?  Cystic pancreatic structure on abdominal CT 2019  Admit date 06/21/2021 with progressive shortness of breath confusion in the setting of recent diagnosis of dysphagia and increasing lethargy Came to emergency room found to be hypoxic into the 70s, COVID-positive CXR right-sided multinodular infection fever 104.9 lactic acid 2.5, hypotensive into the 80s with a MAP of 60 started on IV fluid bolus of  Hospital-Problem based course  Multifocal pneumonia probably aspiration Continue azithromycin and ceftriaxone narrowed to Levaquin in a.m. if continues to do well Aspiration precautions, thickened liquids-currently keep on dysphagia 1 until reevaluated by SLP 8/29 Mild COVID infection Inflammatory markers are minimally elevated Hold work-up for DVT/PE but consider the same dimer rises exponentially above 3.5 or CRP rises above 10 or 11 Complete remdesivir 8/30, continue steroids for at least 10 days  Chronic respiratory failure on home oxygen 3 L secondary to COPD emphysema Has been on hold oxygen for a while and has chronic underlying dysphagia Attempt to de-escalate oxygen nursing informed Continue Breo Ellipta 1 puff daily, Flonase 2 puffs, albuterol every 2 as needed CAD, aortic atherosclerosis Continue aspirin 81--not on beta-blocker not on ACE Mild bradycardia Noticed mainly while asleep-telemetry reviewed no pauses-monitor only at this time-discussed with family-poor candidate for  any work-up Prior right pulmonary embolism--no longer On DOAC Prior pancreatic structure Prior hip fractures----all to be followed as an outpatient    DVT prophylaxis: Lovenox prophylactic dosing Code Status: DNR confirmed Family Communication: Discussed with daughter [name] [phone] at the bedside Disposition:  Status is: Inpatient  Remains inpatient appropriate because:Hemodynamically unstable, Unsafe d/c plan, and IV treatments appropriate due to intensity of illness or inability to take PO  Dispo: The patient is from: Home              Anticipated d/c is to: SNF              Patient currently is not medically stable to d/c.   Difficult to place patient No       Consultants:  n  Procedures: n  Antimicrobials: azithri/ceftriax    Subjective:  Appears to be much better than she was previously Eating and drinking some of her dysphagia diet No chest No fever or chills. Usually needs significant assistance and lives with daughter-has a left Daughter asks for hospital bed Daughter thinks she is improved significantly  Objective: Vitals:   06/22/21 2132 06/23/21 0000 06/23/21 0313 06/23/21 0400  BP:      Pulse:      Resp:  17    Temp: (!) 97.1 F (36.2 C) (!) 96 F (35.6 C)  98.6 F (37 C)  TempSrc:  Axillary  Oral  SpO2: 100%  95%     Intake/Output Summary (Last 24 hours) at 06/23/2021 0728 Last data filed at 06/23/2021 0442 Gross per 24 hour  Intake 740.71 ml  Output 100 ml  Net 640.71 ml   There were no vitals filed for this visit.  Examination:  Alert pleasant EOMI NCAT no focal deficit edentulous course crepitations posterolaterally S 1  S2 no murmur telemetry reviewed sinus bradycardia noted abdomen soft scaphoid nontender no rebound no guarding ROM intact to lower extremities with no edema intact Psych is euthymic although she cannot orient well to date place or person she can tell me her daughter's name   Data Reviewed: personally reviewed    CBC    Component Value Date/Time   WBC 8.4 06/23/2021 0311   RBC 3.14 (L) 06/23/2021 0311   HGB 9.7 (L) 06/23/2021 0311   HGB 11.3 05/11/2019 1438   HCT 30.6 (L) 06/23/2021 0311   HCT 36.1 05/11/2019 1438   PLT 324 06/23/2021 0311   PLT 258 05/11/2019 1438   MCV 97.5 06/23/2021 0311   MCV 88 05/11/2019 1438   MCH 30.9 06/23/2021 0311   MCHC 31.7 06/23/2021 0311   RDW 14.3 06/23/2021 0311   RDW 17.0 (H) 05/11/2019 1438   LYMPHSABS 0.7 06/23/2021 0311   MONOABS 0.4 06/23/2021 0311   EOSABS 0.0 06/23/2021 0311   BASOSABS 0.0 06/23/2021 0311   CMP Latest Ref Rng & Units 06/23/2021 06/22/2021 06/21/2021  Glucose 70 - 99 mg/dL 104(H) 173(H) -  BUN 8 - 23 mg/dL 31(H) 28(H) -  Creatinine 0.44 - 1.00 mg/dL 0.64 0.74 -  Sodium 135 - 145 mmol/L 140 141 141  Potassium 3.5 - 5.1 mmol/L 3.3(L) 4.1 4.2  Chloride 98 - 111 mmol/L 106 106 -  CO2 22 - 32 mmol/L 26 26 -  Calcium 8.9 - 10.3 mg/dL 8.9 8.6(L) -  Total Protein 6.5 - 8.1 g/dL 6.3(L) 6.1(L) -  Total Bilirubin 0.3 - 1.2 mg/dL 0.4 0.7 -  Alkaline Phos 38 - 126 U/L 93 105 -  AST 15 - 41 U/L 26 28 -  ALT 0 - 44 U/L 16 18 -     Radiology Studies: DG Chest Port 1 View  Result Date: 06/21/2021 CLINICAL DATA:  87 year old female with questionable sepsis EXAM: PORTABLE CHEST 1 VIEW COMPARISON:  02/03/2021 FINDINGS: Cardiomediastinal silhouette unchanged in size and contour. No evidence of central vascular congestion. No interlobular septal thickening. Changes of prior TAVR again noted. Increased reticulonodular opacities of the right lung. No pneumothorax or pleural effusion. No pneumothorax or pleural effusion. No acute displaced fracture. Surgical changes of the right glenohumeral joint. Chronic deformity of the right clavicle. IMPRESSION: Increased right-sided reticulonodular opacities, concerning for multifocal infection or possibly aspiration. Surgical changes of prior TAVR. Electronically Signed   By: Corrie Mckusick D.O.   On:  06/21/2021 16:37     Scheduled Meds:  albuterol  2 puff Inhalation Q6H   vitamin C  500 mg Oral Daily   aspirin EC  81 mg Oral Daily   dexamethasone (DECADRON) injection  6 mg Intravenous Q24H   enoxaparin (LOVENOX) injection  30 mg Subcutaneous Q24H   escitalopram  20 mg Oral Daily   fluticasone  2 spray Each Nare Daily   fluticasone furoate-vilanterol  1 puff Inhalation Daily   simvastatin  20 mg Oral q1800   zinc sulfate  220 mg Oral Daily   Continuous Infusions:  azithromycin 500 mg (06/22/21 2300)   cefTRIAXone (ROCEPHIN)  IV 2 g (06/22/21 2202)   remdesivir 100 mg in NS 100 mL 100 mg (06/22/21 1321)     LOS: 2 days   Time spent: [place], MD [facility] To contact the attending provider between 7A-7P or the covering provider during after hours 7P-7A, please log into the web site [email or web address] and access using universal [place] password for  that web site. If you do not have the password, please call the hospital operator.  06/23/2021, 7:28 AM

## 2021-06-24 DIAGNOSIS — R531 Weakness: Secondary | ICD-10-CM

## 2021-06-24 LAB — CBC WITH DIFFERENTIAL/PLATELET
Abs Immature Granulocytes: 0.02 10*3/uL (ref 0.00–0.07)
Basophils Absolute: 0 10*3/uL (ref 0.0–0.1)
Basophils Relative: 0 %
Eosinophils Absolute: 0 10*3/uL (ref 0.0–0.5)
Eosinophils Relative: 0 %
HCT: 28.9 % — ABNORMAL LOW (ref 36.0–46.0)
Hemoglobin: 8.9 g/dL — ABNORMAL LOW (ref 12.0–15.0)
Immature Granulocytes: 1 %
Lymphocytes Relative: 12 %
Lymphs Abs: 0.5 10*3/uL — ABNORMAL LOW (ref 0.7–4.0)
MCH: 30.5 pg (ref 26.0–34.0)
MCHC: 30.8 g/dL (ref 30.0–36.0)
MCV: 99 fL (ref 80.0–100.0)
Monocytes Absolute: 0.3 10*3/uL (ref 0.1–1.0)
Monocytes Relative: 8 %
Neutro Abs: 3.1 10*3/uL (ref 1.7–7.7)
Neutrophils Relative %: 79 %
Platelets: 340 10*3/uL (ref 150–400)
RBC: 2.92 MIL/uL — ABNORMAL LOW (ref 3.87–5.11)
RDW: 14.3 % (ref 11.5–15.5)
WBC: 3.9 10*3/uL — ABNORMAL LOW (ref 4.0–10.5)
nRBC: 0 % (ref 0.0–0.2)

## 2021-06-24 LAB — COMPREHENSIVE METABOLIC PANEL
ALT: 16 U/L (ref 0–44)
AST: 20 U/L (ref 15–41)
Albumin: 2.2 g/dL — ABNORMAL LOW (ref 3.5–5.0)
Alkaline Phosphatase: 80 U/L (ref 38–126)
Anion gap: 5 (ref 5–15)
BUN: 28 mg/dL — ABNORMAL HIGH (ref 8–23)
CO2: 26 mmol/L (ref 22–32)
Calcium: 9 mg/dL (ref 8.9–10.3)
Chloride: 109 mmol/L (ref 98–111)
Creatinine, Ser: 0.61 mg/dL (ref 0.44–1.00)
GFR, Estimated: >60 mL/min (ref >60)
Glucose, Bld: 114 mg/dL — ABNORMAL HIGH (ref 70–99)
Potassium: 3.9 mmol/L (ref 3.5–5.1)
Sodium: 140 mmol/L (ref 135–145)
Total Bilirubin: 0.6 mg/dL (ref 0.3–1.2)
Total Protein: 5.9 g/dL — ABNORMAL LOW (ref 6.5–8.1)

## 2021-06-24 LAB — D-DIMER, QUANTITATIVE: D-Dimer, Quant: 2.3 ug/mL-FEU — ABNORMAL HIGH (ref 0.00–0.50)

## 2021-06-24 LAB — C-REACTIVE PROTEIN: CRP: 4.8 mg/dL — ABNORMAL HIGH (ref <1.0)

## 2021-06-24 LAB — MAGNESIUM: Magnesium: 1.8 mg/dL (ref 1.7–2.4)

## 2021-06-24 NOTE — Progress Notes (Signed)
PROGRESS NOTE    Jessica [name] [facility]  [id number] DOB: [birth year] DOA: 06/21/2021 PCP: [name], [name], MD    Brief Narrative:  87 year old with chronic debility, COPD and chronic hypoxemic respiratory failure on 3 L oxygen at home, chronic aspiration with dysphagia, diastolic heart failure, generalized anxiety disorder, severe aortic regurgitation and status post TAVR and coronary artery disease brought to the emergency room with progressive worsening shortness of breath, weakness and confusion.  Patient lives with her daughter who developed COVID-19 infection about 9 days ago and improved.  Patient started symptoms about 2 to 3 days ago with increasing lethargic.  In the emergency room, COVID-19 positive.  Chest x-ray with bilateral pneumonia right more than left.  Temperature 104.9.  Lactic acid 2.5.  Resuscitated with IV fluids.  Started on antibiotics and antiviral therapies and admitted to the hospital.   Assessment & Plan:   Principal Problem:   Acute on chronic respiratory failure with hypoxia (HCC) Active Problems:   Mixed hyperlipidemia   Coronary artery disease involving native coronary artery of native heart without angina pectoris   Chronic diastolic CHF (congestive heart failure) ([place])   COVID-19 virus infection   Aspiration pneumonia of right lung due to gastric secretions (HCC)   Acute metabolic encephalopathy   Lactic acidosis   Sepsis with organ dysfunction ([place])   Dysphagia  Acute on chronic hypoxemic respiratory failure uses 3 L oxygen at home.Pneumonia due to COVID-19 virus infection: Sepsis present on admission with organ dysfunction secondary to aspiration pneumonia. Continue chest physiotherapy, incentive spirometry, deep breathing exercises, sputum induction, mucolytic's and bronchodilators. Supplemental oxygen to keep saturations more than 90%. Covid directed therapy with , steroids, on dexamethasone remdesivir, day 4/5 antibiotics, on Rocephin and  azithromycin.  Suspected significant aspiration.  Inflammatory markers improving.  Patient very comfortable currently on 2 L of oxygen which is her baseline.  We will repeat chest x-ray tomorrow.  Acute metabolic encephalopathy: Due to above.  Currently resolved.  Chronic diastolic heart failure: Well compensated.  Dehydrated now.  On maintenance IV fluids.  Coronary artery disease: No evidence of ischemia.  Currently on a statin and aspirin.  Goal of care: Previous hospitalist had a long discussion with the daughter and after discussion and having palliative care on board, CODE STATUS was changed to DNR.  They decided to let her eat honey thick liquids and pured diet with continuous close monitoring. Palliative care on constant communications with family.  I personally spoke to the daughter today and she told me that she would like to pursue rehab/SNF for the patient.  DVT prophylaxis: enoxaparin (LOVENOX) injection 30 mg Start: 06/22/21 1000   Code Status: DNR/DNI Family Communication: Daughter on the phone Disposition Plan: Status is: Inpatient  Remains inpatient appropriate because:IV treatments appropriate due to intensity of illness or inability to take PO and Inpatient level of care appropriate due to severity of illness  Dispo: The patient is from: Home              Anticipated d/c is to: SNF              Patient currently is medically stable to d/c.   Difficult to place patient No         Consultants:  None  Procedures:  None  Antimicrobials:  Rocephin, azithromycin, remdesivir 8/26----   Subjective: Patient seen and examined this morning.  She was fully alert and oriented although she was very hard of hearing and the mask was also not helping  [name] having good communication.  Patient was oriented as well.  She denied any complaints.  Objective: Vitals:   06/23/21 2149 06/23/21 2332 06/24/21 0500 06/24/21 1015  BP: (!) 156/53 (!) 156/53 (!) 155/51 (!) 155/65   Pulse: (!) 43 (!) 57 (!) 43 (!) 33  Resp: '15 15 15   '$ Temp: 98 F (36.7 C) 98 F (36.7 C) 98 F (36.7 C)   TempSrc: Oral     SpO2: 100% 100% 100% 90%    Intake/Output Summary (Last 24 hours) at 06/24/2021 1602 Last data filed at 06/23/2021 2150 Gross per 24 hour  Intake --  Output 400 ml  Net -400 ml    There were no vitals filed for this visit.  Examination:  General exam: Appears calm and comfortable  Respiratory system: Clear to auscultation. Respiratory effort normal. Cardiovascular system: S1 & S2 heard, RRR. No JVD, murmurs, rubs, gallops or clicks. No pedal edema. Gastrointestinal system: Abdomen is nondistended, soft and nontender. No organomegaly or masses felt. Normal bowel sounds heard. Central nervous system: Alert and oriented. No focal neurological deficits. Extremities: Symmetric 5 x 5 power. Skin: No rashes, lesions or ulcers.   Data Reviewed: I have personally reviewed following labs and imaging studies  CBC: Recent Labs  Lab 06/21/21 1619 06/21/21 1800 06/22/21 0241 06/23/21 0311 06/24/21 0241  WBC 6.9  --  9.1 8.4 3.9*  NEUTROABS 5.9  --  8.6* 7.1 3.1  HGB 11.2* 11.2* 9.3* 9.7* 8.9*  HCT 36.1 33.0* 30.5* 30.6* 28.9*  MCV 99.2  --  100.7* 97.5 99.0  PLT 342  --  267 324 [id number]    Basic Metabolic Panel: Recent Labs  Lab 06/21/21 1619 06/21/21 1800 06/22/21 0241 06/23/21 0311 06/24/21 0241  NA 141 141 141 140 140  K 4.7 4.2 4.1 3.3* 3.9  CL 102  --  106 106 109  CO2 25  --  '26 26 26  '$ GLUCOSE 113*  --  173* 104* 114*  BUN 31*  --  28* 31* 28*  CREATININE 0.75  --  0.74 0.64 0.61  CALCIUM 9.5  --  8.6* 8.9 9.0  MG  --   --  1.8 1.9 1.8    GFR: CrCl cannot be calculated (Unknown ideal weight.). Liver Function Tests: Recent Labs  Lab 06/21/21 1619 06/22/21 0241 06/23/21 0311 06/24/21 0241  AST 39 '28 26 20  '$ ALT '22 18 16 16  '$ ALKPHOS 129* 105 93 80  BILITOT 1.4* 0.7 0.4 0.6  PROT 6.9 6.1* 6.3* 5.9*  ALBUMIN 2.8* 2.3* 2.3* 2.2*     No results for input(s): LIPASE, AMYLASE in the last 168 hours. No results for input(s): AMMONIA in the last 168 hours. Coagulation Profile: Recent Labs  Lab 06/21/21 1636 06/22/21 0241  INR 1.0 1.1    Cardiac Enzymes: No results for input(s): CKTOTAL, CKMB, CKMBINDEX, TROPONINI in the last 168 hours. BNP (last 3 results) No results for input(s): PROBNP in the last 8760 hours. HbA1C: No results for input(s): HGBA1C in the last 72 hours. CBG: No results for input(s): GLUCAP in the last 168 hours. Lipid Profile: No results for input(s): CHOL, HDL, LDLCALC, TRIG, CHOLHDL, LDLDIRECT in the last 72 hours. Thyroid Function Tests: No results for input(s): TSH, T4TOTAL, FREET4, T3FREE, THYROIDAB in the last 72 hours. Anemia Panel: No results for input(s): VITAMINB12, FOLATE, FERRITIN, TIBC, IRON, RETICCTPCT in the last 72 hours. Sepsis Labs: Recent Labs  Lab 06/21/21 1610 06/21/21 1810 06/22/21 0241  PROCALCITON  --   --  7.83  LATICACIDVEN 2.5* 1.6  --      Recent Results (from the past 240 hour(s))  Blood Culture (routine x 2)     Status: None (Preliminary result)   Collection Time: 06/21/21  4:30 PM   Specimen: BLOOD  Result Value Ref Range Status   Specimen Description BLOOD BLOOD RIGHT FOREARM  Final   Special Requests   Final    BOTTLES DRAWN AEROBIC AND ANAEROBIC Blood Culture adequate volume   Culture   Final    NO GROWTH 3 DAYS Performed at [facility], [place] [address], [place], [place] [address]    Report Status PENDING  Incomplete  Blood Culture (routine x 2)     Status: None (Preliminary result)   Collection Time: 06/21/21  4:35 PM   Specimen: BLOOD  Result Value Ref Range Status   Specimen Description BLOOD RIGHT ANTECUBITAL  Final   Special Requests   Final    BOTTLES DRAWN AEROBIC AND ANAEROBIC Blood Culture results may not be optimal due to an excessive volume of blood received in culture bottles   Culture   Final    NO GROWTH 3  DAYS Performed at [facility], [place] [address] [place], [place] [address]    Report Status PENDING  Incomplete  Resp Panel by RT-PCR (Flu A&B, Covid) Nasopharyngeal Swab     Status: Abnormal   Collection Time: 06/21/21  4:40 PM   Specimen: Nasopharyngeal Swab; Nasopharyngeal(NP) swabs in vial transport medium  Result Value Ref Range Status   SARS Coronavirus 2 by RT PCR POSITIVE (A) NEGATIVE Final    Comment: RESULT CALLED TO, READ BACK BY AND VERIFIED WITH: C,MAS '@1826'$  06/21/21 EB (NOTE) SARS-CoV-2 target nucleic acids are DETECTED.  The SARS-CoV-2 RNA is generally detectable in upper respiratory specimens during the acute phase of infection. Positive results are indicative of the presence of the identified virus, but do not rule out bacterial infection or co-infection with other pathogens not detected by the test. Clinical correlation with patient history and other diagnostic information is necessary to determine patient infection status. The expected result is Negative.  Fact Sheet for Patients: EntrepreneurPulse.com.au  Fact Sheet for Healthcare Providers: IncredibleEmployment.be  This test is not yet approved or cleared by the [place] FDA and  has been authorized for detection and/or diagnosis of SARS-CoV-2 by FDA under an Emergency Use Authorization (EUA).  This EUA will remain in effect (meaning this test can be used) for  the duration of  the COVID-19 declaration under Section 564(b)(1) of the Act, 21 U.S.C. section 360bbb-3(b)(1), unless the authorization is terminated or revoked sooner.     Influenza A by PCR NEGATIVE NEGATIVE Final   Influenza B by PCR NEGATIVE NEGATIVE Final    Comment: (NOTE) The Xpert Xpress SARS-CoV-2/FLU/RSV plus assay is intended as an aid in the diagnosis of influenza from Nasopharyngeal swab specimens and should not be used as a sole basis for treatment. Nasal washings and aspirates are  unacceptable for Xpert Xpress SARS-CoV-2/FLU/RSV testing.  Fact Sheet for Patients: EntrepreneurPulse.com.au  Fact Sheet for Healthcare Providers: IncredibleEmployment.be  This test is not yet approved or cleared by the [place] FDA and has been authorized for detection and/or diagnosis of SARS-CoV-2 by FDA under an Emergency Use Authorization (EUA). This EUA will remain in effect (meaning this test can be used) for the duration of the COVID-19 declaration under Section 564(b)(1) of the Act, 21 U.S.C. section 360bbb-3(b)(1), unless the authorization is terminated or  revoked.  Performed at [facility], [place] [address] [place], [place] [address]   Urine Culture     Status: None   Collection Time: 06/22/21  1:30 AM   Specimen: In/Out Cath Urine  Result Value Ref Range Status   Specimen Description IN/OUT CATH URINE  Final   Special Requests NONE  Final   Culture   Final    NO GROWTH Performed at [facility], [place] [address], [place], [place] [address]    Report Status 06/23/2021 FINAL  Final          Radiology Studies: No results found.      Scheduled Meds:  albuterol  2 puff Inhalation Q6H   vitamin C  500 mg Oral Daily   aspirin EC  81 mg Oral Daily   dexamethasone (DECADRON) injection  6 mg Intravenous Q24H   enoxaparin (LOVENOX) injection  30 mg Subcutaneous Q24H   escitalopram  20 mg Oral Daily   fluticasone  2 spray Each Nare Daily   fluticasone furoate-vilanterol  1 puff Inhalation Daily   potassium chloride  40 mEq Oral Daily   simvastatin  20 mg Oral q1800   zinc sulfate  220 mg Oral Daily   Continuous Infusions:  azithromycin 500 mg (06/23/21 2252)   cefTRIAXone (ROCEPHIN)  IV 2 g (06/23/21 2245)   remdesivir 100 mg in NS 100 mL 100 mg (06/24/21 1005)     LOS: 3 days    Time spent: 30 minutes  Darliss Cheney, MD [facility]

## 2021-06-24 NOTE — Progress Notes (Signed)
06/24/2021 Patient daughter does not want her mom take the Simvastatin (Zocor). Per daughter her mom have not been taking the medication. [facility] RN.

## 2021-06-24 NOTE — Progress Notes (Signed)
Patient ID: Jessica [name], female   DOB: January 07, 1926, 87 y.o.   MRN: [id number]    Progress Note from the Palliative Medicine Team at [facility]   [name]        Date: 06/24/2021 DOB: [birth year]  Age: 87 y.o. MRN#: [id number] Attending Physician: Darliss Cheney, MD Primary Care Physician: [name], [name], MD Admit Date: 06/21/2021   Medical records reviewed   87 y.o. female  admitted on 06/21/2021 with significant past medical history of COPD, chronic respiratory failure, dysphagia, diastolic congestive heart failure (Echo 03/2020 EF 70-75%), generalized anxiety disorder, severe aortic regurgitation status post TAVR and coronary artery disease (cardiac cath 06/2018  with arthrectomy and DES to LAD) who presents to [facility] emergency department via EMS due to progressively worsening shortness of breath weakness and confusion.    In  the emergency department patient was found to be positive for COVID-19.  Chest x-ray was additionally performed and found to have right-sided pulmonary infiltrates, worse in the right lower lobe.  Patient was initially found to exhibit multiple SIRS criteria including markedly elevated fevers as high as 104.54F .  Patient was additionally found to exhibit a lactic acidosis of 2.5.  Patient was initiated on isotonic intravenous fluids and given broad-spectrum intravenous antibiotics.    Daughter reports patient has had increasing difficulty with swallowing and a 25 pound weight loss over the last 9 months.  Her diet at home is strictly pured   Due to patient progressively worsening lethargy confusion and weakness EMS was contacted who promptly came to evaluate the patient and brought her into [facility] emergency department for evaluation.    Patient admitted for continued evaluation, treatment and stabilization.   Family face treatment option decisions, advanced directive decisions and anticipatory care needs.   This NP  visited patient at the bedside as a follow up to  yesterday's [place] and to meet with daughter as scheduled for continued conversation regarding  current medcial situation  Education offered regarding risks and benefits of artificial feeding/ PEG tube.  Education offered that even if a feeding tube was placed and the patient was diligent about maintaining n.p.o. status she is still at risk for aspiration from secretions.  Daughter reiterates that patient has had difficulty swallowing for many years, unintended weight loss of 25 pounds in the last year and vigorous cough for thick secretions for many years also.   Plan of Care: -DNR/DNI -No artificial nutrition at this point, family hopes that patient will regain her strength and return to baseline swallow -Family requests rehab on discharge if eligible/will write for PT eval and treat  Education offered on the difference between aggressive medical intervention path and a palliative comfort path for this patient at this time in this situation.  Education on hospice benefit offered.  Discussed with patient/daughter  the importance of continued conversation with family and their  medical providers regarding overall plan of care and treatment options,  ensuring decisions are within the context of the patients values and GOCs.  Questions and concerns addressed     Total time spent on the unit was 45 minutes  Greater than 50% of the time was spent in counseling and coordination of care  Wadie Lessen NP  Palliative Medicine Team Team Phone # [phone] Pager [phone]

## 2021-06-24 NOTE — Progress Notes (Signed)
  Speech Language Pathology Treatment: Dysphagia  Patient Details Name: Jessica [name] MRN: [id number] DOB: [birth year] Today's Date: 06/24/2021 Time: [id number] SLP Time Calculation (min) (ACUTE ONLY): 30 min  Assessment / Plan / Recommendation Clinical Impression  Pt was seen for dysphagia treatment. She was alert and cooperative during the session and requesting food and water. An NPO status was recommended by SLP on 8/27, but a dysphagia 1 diet with honey thick was subsequently initiated by Dr. Raelyn Mora following discussing with the pt's daughter. Pt inconsistently demonstrated significant coughing with puree solids. Coughing, either immediate or delayed, was noted with nectar and honey thick liquids, suggesting aspiration. A wet vocal quality was noted intermittently throughout the session and this improved with coughing and suctioning. Pt demonstrated impaired lingual manipulation of boluses; pt was often noted to allow puree and liquid boluses to fall to oropharynx/faucial pillars prior to demonstrating a closed-mouth posture or any lingual movement. Pt's coughing was most significant with honey thick liquids, full-tsp boluses of puree, and with nectar thick liquids via full tsp. Pt's risk of aspiration appears high at this time. However, per EMR, pt's family has been educated by MD regarding risks of aspiration and potential complications therewith, and pt's daughter would like to continue p.o. intake at this time for pt's comfort. Pt's current diet of dysphagia 1 solids and honey thick liquids will therefore be continued with strict observance of swallowing precautions. SLP will continue to follow pt to assess diet tolerance with compensatory strategies, to assess whether there would be any benefit in completing a repeat instrumental assessment, and  for family education regarding compensatory strategies.   HPI HPI: 87 year old female with past medical history of COPD, chronic respiratory  failure, dysphagia, diastolic congestive heart failure (Echo 03/2020 EF 70-75%), generalized anxiety disorder, severe aortic regurgitation status post TAVR and coronary artery disease (cardiac cath 06/2018  with arthrectomy and DES to LAD) who presents to [facility] emergency department via EMS due to progressively worsening shortness of breath weakness and confusion. Per chart coughing when eating recently. MBS 2/20 sensed aspiration unable to clear, Dys 2/nectar recommended.      SLP Plan  Continue with current plan of care       Recommendations  Diet recommendations: Dysphagia 1 (puree);Honey-thick liquid Liquids provided via: Cup;Teaspoon;No straw Medication Administration: Crushed with puree Supervision: Staff to assist with self feeding;Full supervision/cueing for compensatory strategies Compensations: Slow rate;Small sips/bites;Follow solids with liquid;Minimize environmental distractions Postural Changes and/or Swallow Maneuvers: Seated upright 90 degrees;Upright 30-60 min after meal                Oral Care Recommendations: Oral care QID;Staff/trained caregiver to provide oral care Follow up Recommendations:  (TBD) SLP Visit Diagnosis: Dysphagia, unspecified (R13.10) Plan: Continue with current plan of care       [name] I. Hardin Negus, [place], [place] Office number [phone] Pager [phone]                Horton Marshall 06/24/2021, 4:06 PM

## 2021-06-25 ENCOUNTER — Inpatient Hospital Stay (HOSPITAL_COMMUNITY): Payer: Medicare Other

## 2021-06-25 LAB — CBC WITH DIFFERENTIAL/PLATELET
Abs Immature Granulocytes: 0.05 10*3/uL (ref 0.00–0.07)
Basophils Absolute: 0 10*3/uL (ref 0.0–0.1)
Basophils Relative: 0 %
Eosinophils Absolute: 0 10*3/uL (ref 0.0–0.5)
Eosinophils Relative: 0 %
HCT: 28.8 % — ABNORMAL LOW (ref 36.0–46.0)
Hemoglobin: 9.3 g/dL — ABNORMAL LOW (ref 12.0–15.0)
Immature Granulocytes: 1 %
Lymphocytes Relative: 16 %
Lymphs Abs: 0.6 10*3/uL — ABNORMAL LOW (ref 0.7–4.0)
MCH: 30.9 pg (ref 26.0–34.0)
MCHC: 32.3 g/dL (ref 30.0–36.0)
MCV: 95.7 fL (ref 80.0–100.0)
Monocytes Absolute: 0.3 10*3/uL (ref 0.1–1.0)
Monocytes Relative: 9 %
Neutro Abs: 2.9 10*3/uL (ref 1.7–7.7)
Neutrophils Relative %: 74 %
Platelets: 357 10*3/uL (ref 150–400)
RBC: 3.01 MIL/uL — ABNORMAL LOW (ref 3.87–5.11)
RDW: 13.9 % (ref 11.5–15.5)
WBC: 3.9 10*3/uL — ABNORMAL LOW (ref 4.0–10.5)
nRBC: 0 % (ref 0.0–0.2)

## 2021-06-25 LAB — COMPREHENSIVE METABOLIC PANEL
ALT: 20 U/L (ref 0–44)
AST: 31 U/L (ref 15–41)
Albumin: 2.4 g/dL — ABNORMAL LOW (ref 3.5–5.0)
Alkaline Phosphatase: 78 U/L (ref 38–126)
Anion gap: 6 (ref 5–15)
BUN: 21 mg/dL (ref 8–23)
CO2: 27 mmol/L (ref 22–32)
Calcium: 9 mg/dL (ref 8.9–10.3)
Chloride: 106 mmol/L (ref 98–111)
Creatinine, Ser: 0.57 mg/dL (ref 0.44–1.00)
GFR, Estimated: >60 mL/min (ref >60)
Glucose, Bld: 69 mg/dL — ABNORMAL LOW (ref 70–99)
Potassium: 4.3 mmol/L (ref 3.5–5.1)
Sodium: 139 mmol/L (ref 135–145)
Total Bilirubin: 0.3 mg/dL (ref 0.3–1.2)
Total Protein: 5.9 g/dL — ABNORMAL LOW (ref 6.5–8.1)

## 2021-06-25 LAB — C-REACTIVE PROTEIN: CRP: 2.6 mg/dL — ABNORMAL HIGH (ref <1.0)

## 2021-06-25 LAB — MAGNESIUM: Magnesium: 1.7 mg/dL (ref 1.7–2.4)

## 2021-06-25 LAB — D-DIMER, QUANTITATIVE: D-Dimer, Quant: 2.05 ug/mL-FEU — ABNORMAL HIGH (ref 0.00–0.50)

## 2021-06-25 MED ORDER — ALBUTEROL SULFATE HFA 108 (90 BASE) MCG/ACT IN AERS
2.0000 | INHALATION_SPRAY | Freq: Two times a day (BID) | RESPIRATORY_TRACT | Status: DC
  Administered 2021-06-25 – 2021-06-28 (×7): 2 via RESPIRATORY_TRACT

## 2021-06-25 NOTE — Evaluation (Signed)
Occupational Therapy Evaluation Patient Details Name: Jessica [name] MRN: [id number] DOB: 1926/02/09 Today's Date: 06/25/2021    History of Present Illness 87 yo admitted 8/26 with SOB and lethargy Covid (+) with right lung infiltrates. PMhx: COPD on 3L at home, CHF, TAVR, anxiety, CAD, chronic dysphagia, RT hip IM nail   Clinical Impression   PTA patient was living with her daughter and grandson in a private residence and was requiring assist with ADLs/IADLs. Daughter and grandson provided 24hr supervision/assist and granddaughter who lives next door assisted with bathing at shower level (although patient sponge bathed for 1 week PTA 2/2 weakness). Prior to onset of COVID-19 patient was ambulating around the home with use of RW (daughter reports patient would often try to carry RW) and external assist for safety. Patient was able to feed herself. Patient currently requiring Min to Mod A grossly for observed ADLs including LB dressing. Patient also limited by deficits listed below including generalized weakness, SOB with SpO2 >94% on RA with mild activity, decreased activity tolerance and decreased standing balance and would benefit from continued acute OT services in prep for safe d/c to next level of care. Recommendation for return home with 24hr supervision/assist vs. SNF rehab. Family would like patient to d/c to SNF rehab. Patient on day 4 of 10 of hospital admission with risk for further deconditioning. OT will continue to follow acutely.      Follow Up Recommendations  Home health OT;Supervision/Assistance - 24 hour;SNF    Equipment Recommendations  Hospital bed    Recommendations for Other Services       Precautions / Restrictions Precautions Precautions: Fall Precaution Comments: HOH Restrictions Weight Bearing Restrictions: No      Mobility Bed Mobility Overal bed mobility: Needs Assistance Bed Mobility: Rolling;Supine to Sit;Sit to Supine Rolling: Supervision   Supine  to sit: Min assist Sit to supine: Supervision   General bed mobility comments: Able to rolling R<>L in supine with supervision A. Advanced BLE toward EOB without external assist. Min A to elevate trunk with HOB slighlty elevated. Supervision for return to supine. Min use of bed rail throguhout.    Transfers Overall transfer level: Needs assistance Equipment used: 1 person hand held assist Transfers: Sit to/from Stand Sit to Stand: Min assist         General transfer comment: Min A for sit to stand from EOB with HHA. Anterior bias with cues for bringing trunk to neutral.    Balance Overall balance assessment: Needs assistance Sitting-balance support: Single extremity supported;No upper extremity supported;Feet supported Sitting balance-Leahy Scale: Fair Sitting balance - Comments: Able to maintain static sitting balance at EOB for 8-10 minutes without external assist. No LOB with reaching outside of BOS.   Standing balance support: Single extremity supported;During functional activity Standing balance-Leahy Scale: Poor Standing balance comment: Reliant on external assist to maintain static standing balance.                           ADL either performed or assessed with clinical judgement   ADL Overall ADL's : Needs assistance/impaired Eating/Feeding: Set up               Upper Body Dressing : Minimal assistance;Sitting   Lower Body Dressing: Moderate assistance;Sit to/from stand Lower Body Dressing Details (indicate cue type and reason): Mod A to don footwear seated EOB.  Vision Baseline Vision/History: 1 Wears glasses Ability to See in Adequate Light: 0 Adequate Patient Visual Report: No change from baseline       Perception     Praxis      Pertinent Vitals/Pain Pain Assessment: No/denies pain     Hand Dominance Right   Extremity/Trunk Assessment Upper Extremity Assessment Upper Extremity Assessment: Generalized  weakness;LUE deficits/detail LUE Deficits / Details: AROM WFL. 4th and 5th digits of L hand fixed in extension.   Lower Extremity Assessment Lower Extremity Assessment: Defer to PT evaluation   Cervical / Trunk Assessment Cervical / Trunk Assessment: Kyphotic   Communication Communication Communication: HOH;Other (comment) (Absent dentation)   Cognition Arousal/Alertness: Awake/alert Behavior During Therapy: WFL for tasks assessed/performed Overall Cognitive Status: History of cognitive impairments - at baseline                                 General Comments: Patieant A&O to person only. Follows 1-step verbal commands with good accuracy.   General Comments  No family present in room. History obtained from daughter [name] via phone call. Patient on RA upon entry. Poor pleth with probe on ear. Probe relocated to 2nd digit on R hand. SpO2 98% at rest, HR 58bpm, BP 168/59 and RR 18. With bed mobility SpO2 94% on RA, RR 22-24 and HR 64bpm.    Exercises     Shoulder Instructions      Home Living Family/patient expects to be discharged to:: Private residence Living Arrangements: Children;Other (Comment) (Great grandson; granddaughter lives next door) Available Help at Discharge: Family;Available 24 hours/day;Other (Comment) (Daughter in a.m. and great grandson in afternoon) Type of Home: House Home Access: Ramped entrance     Home Layout: One level     Bathroom Shower/Tub: Tub/shower unit;Curtain   Bathroom Toilet: Handicapped height     Home Equipment: Bedside commode;Walker - 2 wheels;Walker - 4 wheels;Shower seat          Prior Functioning/Environment Level of Independence: Needs assistance  Gait / Transfers Assistance Needed: Prior to onset of COVID patient was ambulating with RW and assist from her daughter; unable to ambulate x1 week prior to admission ADL's / Homemaking Assistance Needed: Assist for bathing/dressing at shower level prior to onset of  illness; sponge bathing x2 weeks            OT Problem List: Decreased strength;Decreased activity tolerance;Impaired balance (sitting and/or standing);Decreased coordination;Decreased cognition;Decreased safety awareness;Decreased knowledge of use of DME or AE      OT Treatment/Interventions: Self-care/ADL training;Therapeutic exercise;Energy conservation;DME and/or AE instruction;Therapeutic activities;Cognitive remediation/compensation;Patient/family education;Balance training    OT Goals(Current goals can be found in the care plan section) Acute Rehab OT Goals Patient Stated Goal: Per daughter, for patient to get stronger before returning home. OT Goal Formulation: With patient/family Time For Goal Achievement: 07/09/21 Potential to Achieve Goals: Good ADL Goals Pt Will Perform Grooming: with supervision;sitting Pt Will Perform Upper Body Dressing: with set-up;sitting Pt Will Perform Lower Body Dressing: with min assist;sit to/from stand Pt Will Transfer to Toilet: with min assist;ambulating;bedside commode Pt Will Perform Toileting - Clothing Manipulation and hygiene: with min assist;sit to/from stand Additional ADL Goal #1: Patient will tolerate 15 minute of therapeutic activity indicating increased activity tolerance in prep for ADLs.  OT Frequency: Min 2X/week   Barriers to D/C:            Co-evaluation  AM-PAC OT "6 Clicks" Daily Activity     Outcome Measure Help from another person eating meals?: A Little Help from another person taking care of personal grooming?: A Little Help from another person toileting, which includes using toliet, bedpan, or urinal?: A Lot Help from another person bathing (including washing, rinsing, drying)?: A Lot Help from another person to put on and taking off regular upper body clothing?: A Little Help from another person to put on and taking off regular lower body clothing?: A Lot 6 Click Score: 15   End of Session  Equipment Utilized During Treatment: Gait belt Nurse Communication: Mobility status  Activity Tolerance: Patient limited by fatigue;Patient tolerated treatment well Patient left: in bed;with call bell/phone within reach;with bed alarm set  OT Visit Diagnosis: Unsteadiness on feet (R26.81);Muscle weakness (generalized) (M62.81)                Time: [id number] OT Time Calculation (min): 30 min Charges:  OT General Charges $OT Visit: 1 Visit OT Evaluation $OT Eval Moderate Complexity: 1 Mod OT Treatments $Self Care/Home Management : 8-22 mins  Vernette Moise H. OTR/L Supplemental OT, Department of rehab services [phone]  Laryah Neuser R H. 06/25/2021, 9:11 AM

## 2021-06-25 NOTE — Progress Notes (Signed)
PROGRESS NOTE    Jessica [name] [facility]  [id number] DOB: 1926-02-28 DOA: 06/21/2021 PCP: [name], [name], MD    Brief Narrative:  87 year old with chronic debility, COPD and chronic hypoxemic respiratory failure on 3 L oxygen at home, chronic aspiration with dysphagia, diastolic heart failure, generalized anxiety disorder, severe aortic regurgitation and status post TAVR and coronary artery disease brought to the emergency room with progressive worsening shortness of breath, weakness and confusion.  Patient lives with her daughter who developed COVID-19 infection about 9 days ago and improved.  Patient started symptoms about 2 to 3 days ago with increasing lethargic.  In the emergency room, COVID-19 positive.  Chest x-ray with bilateral pneumonia right more than left.  Temperature 104.9.  Lactic acid 2.5.  Resuscitated with IV fluids.  Started on antibiotics and antiviral therapies and admitted to the hospital.   Assessment & Plan:   Principal Problem:   Acute on chronic respiratory failure with hypoxia (HCC) Active Problems:   Mixed hyperlipidemia   Coronary artery disease involving native coronary artery of native heart without angina pectoris   Chronic diastolic CHF (congestive heart failure) ([place])   COVID-19 virus infection   Aspiration pneumonia of right lung due to gastric secretions (HCC)   Acute metabolic encephalopathy   Lactic acidosis   Sepsis with organ dysfunction ([place])   Dysphagia  Acute on chronic hypoxemic respiratory failure uses 3 L oxygen at home.Pneumonia due to COVID-19 virus infection: Sepsis present on admission with organ dysfunction secondary to aspiration pneumonia. Continue chest physiotherapy, incentive spirometry, deep breathing exercises, sputum induction, mucolytic's and bronchodilators. Supplemental oxygen to keep saturations more than 90%. Covid directed therapy with , steroids, on dexamethasone remdesivir, day 5/5 antibiotics, on Rocephin and  azithromycin.  Suspected significant aspiration.  Inflammatory markers improving.  Patient very comfortable currently on 2 L of oxygen which is her baseline.  Chest x-ray today shows new but small bilateral pleural effusions and no infiltrates.  Will be completing 5 days of Rocephin and Zithromax today.  Acute metabolic encephalopathy: Due to above.  Currently resolved.  Chronic diastolic heart failure: Well compensated.  Dehydrated now.   Coronary artery disease: No evidence of ischemia.  Currently on a statin and aspirin.  Goal of care: Previous hospitalist had a long discussion with the daughter and after discussion and having palliative care on board, CODE STATUS was changed to DNR.  They decided to let her eat honey thick liquids and pured diet with continuous close monitoring. Palliative care on constant communications with family.  I personally spoke to the daughter and she told me that she would like to pursue rehab/SNF for the patient.  DVT prophylaxis: enoxaparin (LOVENOX) injection 30 mg Start: 06/22/21 1000   Code Status: DNR/DNI Family Communication: Daughter on the phone Disposition Plan: Status is: Inpatient  Remains inpatient appropriate because: Unsafe DC plan  Dispo: The patient is from: Home              Anticipated d/c is to: SNF, unfortunately cannot be discharged until 07/01/2021 due to tested positive for COVID on 06/21/2021              Patient currently is medically stable to d/c.   Difficult to place patient No         Consultants:  None  Procedures:  None  Antimicrobials:  Rocephin, azithromycin, remdesivir 8/26----   Subjective: In and examined.  Alert and oriented but very hard of hearing, looks comfortable.  Objective: Vitals:   06/25/21  0256 06/25/21 0420 06/25/21 0734 06/25/21 1123  BP:  (!) 172/60 (!) 169/56   Pulse: (!) 59 (!) 45 (!) 57 77  Resp: 18 18 (!) 22   Temp: 98 F (36.7 C) 98 F (36.7 C) 98.1 F (36.7 C)   TempSrc: Oral  Oral Oral   SpO2: 95% 95% 96% (!) 86%  Weight:        Intake/Output Summary (Last 24 hours) at 06/25/2021 1157 Last data filed at 06/24/2021 2120 Gross per 24 hour  Intake --  Output 400 ml  Net -400 ml    Filed Weights   06/24/21 1706  Weight: 47.5 kg    Examination:  General exam: Appears calm and comfortable  Respiratory system: Clear to auscultation. Respiratory effort normal. Cardiovascular system: S1 & S2 heard, RRR. No JVD, murmurs, rubs, gallops or clicks. No pedal edema. Gastrointestinal system: Abdomen is nondistended, soft and nontender. No organomegaly or masses felt. Normal bowel sounds heard. Central nervous system: Alert and oriented. No focal neurological deficits. Extremities: Symmetric 5 x 5 power. Skin: No rashes, lesions or ulcers.    Data Reviewed: I have personally reviewed following labs and imaging studies  CBC: Recent Labs  Lab 06/21/21 1619 06/21/21 1800 06/22/21 0241 06/23/21 0311 06/24/21 0241 06/25/21 0239  WBC 6.9  --  9.1 8.4 3.9* 3.9*  NEUTROABS 5.9  --  8.6* 7.1 3.1 2.9  HGB 11.2* 11.2* 9.3* 9.7* 8.9* 9.3*  HCT 36.1 33.0* 30.5* 30.6* 28.9* 28.8*  MCV 99.2  --  100.7* 97.5 99.0 95.7  PLT 342  --  267 324 340 [id number]    Basic Metabolic Panel: Recent Labs  Lab 06/21/21 1619 06/21/21 1800 06/22/21 0241 06/23/21 0311 06/24/21 0241 06/25/21 0239  NA 141 141 141 140 140 139  K 4.7 4.2 4.1 3.3* 3.9 4.3  CL 102  --  106 106 109 106  CO2 25  --  '26 26 26 27  '$ GLUCOSE 113*  --  173* 104* 114* 69*  BUN 31*  --  28* 31* 28* 21  CREATININE 0.75  --  0.74 0.64 0.61 0.57  CALCIUM 9.5  --  8.6* 8.9 9.0 9.0  MG  --   --  1.8 1.9 1.8 1.7    GFR: Estimated Creatinine Clearance: 32.9 mL/min (by C-G formula based on SCr of 0.57 mg/dL). Liver Function Tests: Recent Labs  Lab 06/21/21 1619 06/22/21 0241 06/23/21 0311 06/24/21 0241 06/25/21 0239  AST 39 '28 26 20 31  '$ ALT '22 18 16 16 20  '$ ALKPHOS 129* 105 93 80 78  BILITOT 1.4* 0.7 0.4  0.6 0.3  PROT 6.9 6.1* 6.3* 5.9* 5.9*  ALBUMIN 2.8* 2.3* 2.3* 2.2* 2.4*    No results for input(s): LIPASE, AMYLASE in the last 168 hours. No results for input(s): AMMONIA in the last 168 hours. Coagulation Profile: Recent Labs  Lab 06/21/21 1636 06/22/21 0241  INR 1.0 1.1    Cardiac Enzymes: No results for input(s): CKTOTAL, CKMB, CKMBINDEX, TROPONINI in the last 168 hours. BNP (last 3 results) No results for input(s): PROBNP in the last 8760 hours. HbA1C: No results for input(s): HGBA1C in the last 72 hours. CBG: No results for input(s): GLUCAP in the last 168 hours. Lipid Profile: No results for input(s): CHOL, HDL, LDLCALC, TRIG, CHOLHDL, LDLDIRECT in the last 72 hours. Thyroid Function Tests: No results for input(s): TSH, T4TOTAL, FREET4, T3FREE, THYROIDAB in the last 72 hours. Anemia Panel: No results for input(s): VITAMINB12, FOLATE, FERRITIN, TIBC, IRON,  RETICCTPCT in the last 72 hours. Sepsis Labs: Recent Labs  Lab 06/21/21 1610 06/21/21 1810 06/22/21 0241  PROCALCITON  --   --  7.83  LATICACIDVEN 2.5* 1.6  --      Recent Results (from the past 240 hour(s))  Blood Culture (routine x 2)     Status: None (Preliminary result)   Collection Time: 06/21/21  4:30 PM   Specimen: BLOOD  Result Value Ref Range Status   Specimen Description BLOOD BLOOD RIGHT FOREARM  Final   Special Requests   Final    BOTTLES DRAWN AEROBIC AND ANAEROBIC Blood Culture adequate volume   Culture   Final    NO GROWTH 4 DAYS Performed at [facility], [place] [address] [place], [place] [address]    Report Status PENDING  Incomplete  Blood Culture (routine x 2)     Status: None (Preliminary result)   Collection Time: 06/21/21  4:35 PM   Specimen: BLOOD  Result Value Ref Range Status   Specimen Description BLOOD RIGHT ANTECUBITAL  Final   Special Requests   Final    BOTTLES DRAWN AEROBIC AND ANAEROBIC Blood Culture results may not be optimal due to an excessive volume of blood  received in culture bottles   Culture   Final    NO GROWTH 4 DAYS Performed at [facility], [place] [address] [place], [place] [address]    Report Status PENDING  Incomplete  Resp Panel by RT-PCR (Flu A&B, Covid) Nasopharyngeal Swab     Status: Abnormal   Collection Time: 06/21/21  4:40 PM   Specimen: Nasopharyngeal Swab; Nasopharyngeal(NP) swabs in vial transport medium  Result Value Ref Range Status   SARS Coronavirus 2 by RT PCR POSITIVE (A) NEGATIVE Final    Comment: RESULT CALLED TO, READ BACK BY AND VERIFIED WITH: C,MAS '@1826'$  06/21/21 EB (NOTE) SARS-CoV-2 target nucleic acids are DETECTED.  The SARS-CoV-2 RNA is generally detectable in upper respiratory specimens during the acute phase of infection. Positive results are indicative of the presence of the identified virus, but do not rule out bacterial infection or co-infection with other pathogens not detected by the test. Clinical correlation with patient history and other diagnostic information is necessary to determine patient infection status. The expected result is Negative.  Fact Sheet for Patients: EntrepreneurPulse.com.au  Fact Sheet for Healthcare Providers: IncredibleEmployment.be  This test is not yet approved or cleared by the [place] FDA and  has been authorized for detection and/or diagnosis of SARS-CoV-2 by FDA under an Emergency Use Authorization (EUA).  This EUA will remain in effect (meaning this test can be used) for  the duration of  the COVID-19 declaration under Section 564(b)(1) of the Act, 21 U.S.C. section 360bbb-3(b)(1), unless the authorization is terminated or revoked sooner.     Influenza A by PCR NEGATIVE NEGATIVE Final   Influenza B by PCR NEGATIVE NEGATIVE Final    Comment: (NOTE) The Xpert Xpress SARS-CoV-2/FLU/RSV plus assay is intended as an aid in the diagnosis of influenza from Nasopharyngeal swab specimens and should not be used as a  sole basis for treatment. Nasal washings and aspirates are unacceptable for Xpert Xpress SARS-CoV-2/FLU/RSV testing.  Fact Sheet for Patients: EntrepreneurPulse.com.au  Fact Sheet for Healthcare Providers: IncredibleEmployment.be  This test is not yet approved or cleared by the [place] FDA and has been authorized for detection and/or diagnosis of SARS-CoV-2 by FDA under an Emergency Use Authorization (EUA). This EUA will remain in effect (meaning this test  can be used) for the duration of the COVID-19 declaration under Section 564(b)(1) of the Act, 21 U.S.C. section 360bbb-3(b)(1), unless the authorization is terminated or revoked.  Performed at [facility], [place] [address] [place], [place] [address]   Urine Culture     Status: None   Collection Time: 06/22/21  1:30 AM   Specimen: In/Out Cath Urine  Result Value Ref Range Status   Specimen Description IN/OUT CATH URINE  Final   Special Requests NONE  Final   Culture   Final    NO GROWTH Performed at [facility], [place] [address], [place], [place] [address]    Report Status 06/23/2021 FINAL  Final          Radiology Studies: DG CHEST PORT 1 VIEW  Result Date: 06/25/2021 CLINICAL DATA:  COVID-19 positivity with respiratory failure EXAM: PORTABLE CHEST 1 VIEW COMPARISON:  06/21/2021 FINDINGS: Cardiac shadow is stable. Changes of prior TAVR are seen. Aortic calcifications are again noted. Some increase in the degree of left retrocardiac density is seen when compare with the prior exam. New small pleural effusions are noted bilaterally. No other focal infiltrate is seen. Skin fold is noted over the left chest. Postsurgical changes in the right shoulder are noted. IMPRESSION: New bilateral small effusions.  Mild left retrocardiac opacity. Electronically Signed   By: Inez Catalina M.D.   On: 06/25/2021 08:43        Scheduled Meds:  albuterol  2 puff Inhalation BID    vitamin C  500 mg Oral Daily   aspirin EC  81 mg Oral Daily   dexamethasone (DECADRON) injection  6 mg Intravenous Q24H   enoxaparin (LOVENOX) injection  30 mg Subcutaneous Q24H   escitalopram  20 mg Oral Daily   fluticasone  2 spray Each Nare Daily   fluticasone furoate-vilanterol  1 puff Inhalation Daily   potassium chloride  40 mEq Oral Daily   zinc sulfate  220 mg Oral Daily   Continuous Infusions:  azithromycin 500 mg (06/24/21 2344)   cefTRIAXone (ROCEPHIN)  IV 2 g (06/24/21 2342)     LOS: 4 days    Time spent: 26 minutes  Darliss Cheney, MD [facility]

## 2021-06-25 NOTE — Evaluation (Signed)
Physical Therapy Evaluation Patient Details Name: Jessica [name] MRN: [id number] DOB: [birth year] Today's Date: 06/25/2021   History of Present Illness  87 yo admitted 8/26 with SOB and lethargy Covid (+) with right lung infiltrates. PMhx: COPD on 3L at home, CHF, TAVR, anxiety, CAD, chronic dysphagia, RT hip IM nail  Clinical Impression  Pt pleasant and reports sore sacrum with desire to be OOB. Pt with mod assist for transfer and pivoting to [facility] and recliner. Pt with great difficulty maintaining standing for assisted pericare as pt quick to fatigue. Pt also with desaturation to 88% with transfers on RA and 85% during toileting with return to 93% at rest on RA. Pt with decreased strength, function, balance and ambulation who will benefit from acute therapy to maximize mobility, safety and function to decrease burden of care.   HR 50 at rest up to 70 with activity    Follow Up Recommendations SNF;Supervision/Assistance - 24 hour    Equipment Recommendations  Wheelchair (measurements PT);Wheelchair cushion (measurements PT)    Recommendations for Other Services       Precautions / Restrictions Precautions Precautions: Fall Precaution Comments: HOH, incontinent, covid      Mobility  Bed Mobility Overal bed mobility: Needs Assistance Bed Mobility: Supine to Sit     Supine to sit: Min assist;HOB elevated     General bed mobility comments: HOB 30 degrees with increased time and physical assist to lift trunk and scoot toward EOB    Transfers Overall transfer level: Needs assistance   Transfers: Sit to/from Stand;Stand Pivot Transfers Sit to Stand: Mod assist Stand pivot transfers: Mod assist       General transfer comment: mod assist to rise from bed, pivot to [facility], stand from [facility] with Rw for stability for pericare. Pt required 2 trials of standing due to rapid fatigue in standing for pericare. Pt then pivoted BSC to recliner  Ambulation/Gait                 Stairs            Wheelchair Mobility    Modified Rankin (Stroke Patients Only)       Balance Overall balance assessment: Needs assistance   Sitting balance-Leahy Scale: Fair Sitting balance - Comments: EOB and BSC without UE support with supervision for safety   Standing balance support: Bilateral upper extremity supported Standing balance-Leahy Scale: Poor Standing balance comment: external support and UE assist to maintain standing or RW                             Pertinent Vitals/Pain Pain Assessment: 0-10 Faces Pain Scale: Hurts little more Pain Location: sacrum Pain Descriptors / Indicators: Sore Pain Intervention(s): Limited activity within patient's tolerance;Repositioned;Other (comment) (positioned on pillow in chair)    Home Living Family/patient expects to be discharged to:: Private residence Living Arrangements: Children;Other (Comment) (Great grandson; granddaughter lives next door) Available Help at Discharge: Family;Available 24 hours/day;Other (Comment) (Daughter in a.m. and great grandson in afternoon) Type of Home: House Home Access: Ramped entrance     Home Layout: One level Home Equipment: Bedside commode;Walker - 2 wheels;Walker - 4 wheels;Shower seat      Prior Function Level of Independence: Needs assistance   Gait / Transfers Assistance Needed: Prior to onset of COVID patient was ambulating with RW and assist from her daughter; unable to ambulate x1 week prior to admission  ADL's / Homemaking Assistance Needed: Assist for bathing/dressing  at shower level prior to onset of illness; sponge bathing x2 weeks        Hand Dominance   Dominant Hand: Right    Extremity/Trunk Assessment   Upper Extremity Assessment Upper Extremity Assessment: Defer to OT evaluation    Lower Extremity Assessment Lower Extremity Assessment: Generalized weakness (pt grossly 2/5 with bil LE with functional mobility)    Cervical / Trunk  Assessment Cervical / Trunk Assessment: Kyphotic  Communication   Communication: HOH;Other (comment) (Absent dentation)  Cognition Arousal/Alertness: Awake/alert Behavior During Therapy: WFL for tasks assessed/performed Overall Cognitive Status: Difficult to assess                                 General Comments: HOH, following commands with increased time and assist for mobility      General Comments      Exercises     Assessment/Plan    PT Assessment Patient needs continued PT services  PT Problem List Decreased strength;Decreased mobility;Decreased safety awareness;Decreased activity tolerance;Decreased balance;Decreased knowledge of use of DME;Cardiopulmonary status limiting activity       PT Treatment Interventions DME instruction;Gait training;Functional mobility training;Therapeutic activities;Patient/family education;Balance training;Therapeutic exercise    PT Goals (Current goals can be found in the Care Plan section)  Acute Rehab PT Goals Patient Stated Goal: pt stating desire to return home but per family requesting SNF PT Goal Formulation: With patient Time For Goal Achievement: 07/09/21 Potential to Achieve Goals: Fair    Frequency Min 2X/week   Barriers to discharge Decreased caregiver support      Co-evaluation               AM-PAC PT "6 Clicks" Mobility  Outcome Measure Help needed turning from your back to your side while in a flat bed without using bedrails?: A Little Help needed moving from lying on your back to sitting on the side of a flat bed without using bedrails?: A Lot Help needed moving to and from a bed to a chair (including a wheelchair)?: A Lot Help needed standing up from a chair using your arms (e.g., wheelchair or bedside chair)?: A Lot Help needed to walk in hospital room?: Total Help needed climbing 3-5 steps with a railing? : Total 6 Click Score: 11    End of Session   Activity Tolerance: Patient tolerated  treatment well Patient left: in chair;with call bell/phone within reach;with chair alarm set Nurse Communication: Mobility status PT Visit Diagnosis: Other abnormalities of gait and mobility (R26.89);Difficulty in walking, not elsewhere classified (R26.2);Muscle weakness (generalized) (M62.81)    Time: [id number] PT Time Calculation (min) (ACUTE ONLY): 35 min   Charges:   PT Evaluation $PT Eval Moderate Complexity: 1 Mod PT Treatments $Therapeutic Activity: 8-22 mins        Rheya Minogue P, PT Acute Rehabilitation Services Pager: [phone] Office: [place] Abdul[place]heem [place] 06/25/2021, 12:19 PM

## 2021-06-26 ENCOUNTER — Inpatient Hospital Stay (HOSPITAL_COMMUNITY): Payer: Medicare Other

## 2021-06-26 LAB — CBC WITH DIFFERENTIAL/PLATELET
Abs Immature Granulocytes: 0.04 10*3/uL (ref 0.00–0.07)
Basophils Absolute: 0 10*3/uL (ref 0.0–0.1)
Basophils Relative: 0 %
Eosinophils Absolute: 0 10*3/uL (ref 0.0–0.5)
Eosinophils Relative: 0 %
HCT: 27.6 % — ABNORMAL LOW (ref 36.0–46.0)
Hemoglobin: 9.2 g/dL — ABNORMAL LOW (ref 12.0–15.0)
Immature Granulocytes: 1 %
Lymphocytes Relative: 18 %
Lymphs Abs: 0.6 10*3/uL — ABNORMAL LOW (ref 0.7–4.0)
MCH: 30.9 pg (ref 26.0–34.0)
MCHC: 33.3 g/dL (ref 30.0–36.0)
MCV: 92.6 fL (ref 80.0–100.0)
Monocytes Absolute: 0.3 10*3/uL (ref 0.1–1.0)
Monocytes Relative: 9 %
Neutro Abs: 2.3 10*3/uL (ref 1.7–7.7)
Neutrophils Relative %: 72 %
Platelets: 344 10*3/uL (ref 150–400)
RBC: 2.98 MIL/uL — ABNORMAL LOW (ref 3.87–5.11)
RDW: 13.8 % (ref 11.5–15.5)
WBC: 3.2 10*3/uL — ABNORMAL LOW (ref 4.0–10.5)
nRBC: 0 % (ref 0.0–0.2)

## 2021-06-26 LAB — CULTURE, BLOOD (ROUTINE X 2)
Culture: NO GROWTH
Culture: NO GROWTH
Special Requests: ADEQUATE

## 2021-06-26 LAB — C-REACTIVE PROTEIN: CRP: 1.4 mg/dL — ABNORMAL HIGH (ref <1.0)

## 2021-06-26 LAB — COMPREHENSIVE METABOLIC PANEL
ALT: 28 U/L (ref 0–44)
AST: 37 U/L (ref 15–41)
Albumin: 2.2 g/dL — ABNORMAL LOW (ref 3.5–5.0)
Alkaline Phosphatase: 74 U/L (ref 38–126)
Anion gap: 5 (ref 5–15)
BUN: 19 mg/dL (ref 8–23)
CO2: 27 mmol/L (ref 22–32)
Calcium: 8.8 mg/dL — ABNORMAL LOW (ref 8.9–10.3)
Chloride: 101 mmol/L (ref 98–111)
Creatinine, Ser: 0.56 mg/dL (ref 0.44–1.00)
GFR, Estimated: >60 mL/min (ref >60)
Glucose, Bld: 91 mg/dL (ref 70–99)
Potassium: 4.5 mmol/L (ref 3.5–5.1)
Sodium: 133 mmol/L — ABNORMAL LOW (ref 135–145)
Total Bilirubin: 0.6 mg/dL (ref 0.3–1.2)
Total Protein: 5.5 g/dL — ABNORMAL LOW (ref 6.5–8.1)

## 2021-06-26 LAB — D-DIMER, QUANTITATIVE: D-Dimer, Quant: 1.92 ug/mL-FEU — ABNORMAL HIGH (ref 0.00–0.50)

## 2021-06-26 LAB — MAGNESIUM: Magnesium: 1.6 mg/dL — ABNORMAL LOW (ref 1.7–2.4)

## 2021-06-26 MED ORDER — MAGNESIUM SULFATE 2 GM/50ML IV SOLN
2.0000 g | Freq: Once | INTRAVENOUS | Status: AC
  Administered 2021-06-26: 2 g via INTRAVENOUS
  Filled 2021-06-26: qty 50

## 2021-06-26 NOTE — Progress Notes (Signed)
Occupational Therapy Treatment Patient Details Name: Jessica [name] MRN: [id number] DOB: 04-Mar-1926 Today's Date: 06/26/2021    History of present illness 87 yo admitted 8/26 with SOB and lethargy Covid (+) with right lung infiltrates. PMhx: COPD on 3L at home, CHF, TAVR, anxiety, CAD, chronic dysphagia, RT hip IM nail   OT comments  Pt. Has decreased I and safety with ADLs and transfers. When therapist entered room her nurse was in there and stated her respiratory rate was 50 prior to nursing intervention. Pt. RR varied from 18 to 40 during session and o2 sats from 89 to 97. Pt. Session was in supine with hob elevated secondary to RR and O2 levels.   Follow Up Recommendations  Supervision/Assistance - 24 hour;SNF;Home health OT    Equipment Recommendations  Hospital bed    Recommendations for Other Services      Precautions / Restrictions Precautions Precautions: Fall Precaution Comments: HOH, incontinent, covid Restrictions Weight Bearing Restrictions: No       Mobility Bed Mobility Overal bed mobility: Needs Assistance   Rolling: Min assist              Transfers                      Balance                                           ADL either performed or assessed with clinical judgement   ADL Overall ADL's : Needs assistance/impaired Eating/Feeding: Set up   Grooming: Wash/dry hands;Wash/dry face;Set up;Bed level   Upper Body Bathing: Bed level;Maximal assistance   Lower Body Bathing: Total assistance;Bed level   Upper Body Dressing : Minimal assistance;Bed level   Lower Body Dressing: Total assistance;Bed level                 General ADL Comments: Pt. o2 and respitory rate was fluctuating so had pt. remain in supine with hob elevated.     Vision   Vision Assessment?: No apparent visual deficits   Perception     Praxis      Cognition Arousal/Alertness: Awake/alert Behavior During Therapy: WFL for tasks  assessed/performed Overall Cognitive Status: No family/caregiver present to determine baseline cognitive functioning                                 General Comments: HOH, following commands with increased time and assist for mobility        Exercises Exercises: General Upper Extremity General Exercises - Upper Extremity Shoulder Flexion: AAROM;Both;20 reps;Supine Shoulder Extension: AAROM;Both;20 reps;Supine Elbow Flexion: AROM;Both;20 reps Elbow Extension: AROM;Both;20 reps   Shoulder Instructions       General Comments      Pertinent Vitals/ Pain       Pain Assessment: No/denies pain Faces Pain Scale: No hurt  Home Living                                          Prior Functioning/Environment              Frequency  Min 2X/week        Progress Toward Goals  OT Goals(current goals can now be found in  the care plan section)  Progress towards OT goals: Progressing toward goals  Acute Rehab OT Goals Patient Stated Goal: get better OT Goal Formulation: With patient/family Time For Goal Achievement: 07/09/21 Potential to Achieve Goals: Good ADL Goals Pt Will Perform Grooming: with supervision;sitting Pt Will Perform Upper Body Dressing: with set-up;sitting Pt Will Perform Lower Body Dressing: with min assist;sit to/from stand Pt Will Transfer to Toilet: with min assist;ambulating;bedside commode Pt Will Perform Toileting - Clothing Manipulation and hygiene: with min assist;sit to/from stand Additional ADL Goal #1: Patient will tolerate 15 minute of therapeutic activity indicating increased activity tolerance in prep for ADLs.  Plan Discharge plan remains appropriate    Co-evaluation                 AM-PAC OT "6 Clicks" Daily Activity     Outcome Measure   Help from another person eating meals?: A Little Help from another person taking care of personal grooming?: A Little Help from another person toileting, which  includes using toliet, bedpan, or urinal?: Total Help from another person bathing (including washing, rinsing, drying)?: A Lot Help from another person to put on and taking off regular upper body clothing?: A Lot Help from another person to put on and taking off regular lower body clothing?: Total 6 Click Score: 12    End of Session    OT Visit Diagnosis: Unsteadiness on feet (R26.81);Muscle weakness (generalized) (M62.81)   Activity Tolerance Other (comment) (limted by rr and o2 sats)   Patient Left in bed;with call bell/phone within reach;with bed alarm set;Other (comment) (slp present)   Nurse Communication  (ok therapy, discussed respiratory status and that pt. was complaining of bottom hurting.)        Time: [id number] OT Time Calculation (min): 35 min  Charges: OT General Charges $OT Visit: 1 Visit OT Treatments $Self Care/Home Management : 8-22 mins $Therapeutic Exercise: 8-22 mins  Reece Packer OT/L    [place] 06/26/2021, 10:33 AM

## 2021-06-26 NOTE — Progress Notes (Signed)
Patient ID: Jessica [name], female   DOB: 05-31-1926, 87 y.o.   MRN: [id number]    Progress Note from the Palliative Medicine Team at [facility]   Patient Name: Jessica [name]        Date: 06/26/2021 DOB: [birth year]  Age: 87 y.o. MRN#: [id number] Attending Physician: Darliss Cheney, MD Primary Care Physician: [name], [name], MD Admit Date: 06/21/2021   Medical records reviewed   87 y.o. female  admitted on 06/21/2021 with significant past medical history of COPD, chronic respiratory failure, dysphagia, diastolic congestive heart failure (Echo 03/2020 EF 70-75%), generalized anxiety disorder, severe aortic regurgitation status post TAVR and coronary artery disease (cardiac cath 06/2018  with arthrectomy and DES to LAD) who presents to [facility] emergency department via EMS due to progressively worsening shortness of breath weakness and confusion.    In  the emergency department patient was found to be positive for COVID-19.  Chest x-ray was additionally performed and found to have right-sided pulmonary infiltrates, worse in the right lower lobe.  Patient was initially found to exhibit multiple SIRS criteria including markedly elevated fevers as high as 104.97F .  Patient was additionally found to exhibit a lactic acidosis of 2.5.  Patient was initiated on isotonic intravenous fluids and given broad-spectrum intravenous antibiotics.    Daughter reports patient has had increasing difficulty with swallowing and a 25 pound weight loss over the last 9 months.  Her diet at home is strictly pured   Due to patient progressively worsening lethargy confusion and weakness EMS was contacted who promptly came to evaluate the patient and brought her into [facility] emergency department for evaluation.    Patient admitted for continued evaluation, treatment and stabilization.   Family face treatment option decisions, advanced directive decisions and anticipatory care needs.   This NP  visited patient at the bedside as a follow up for palliative medicine needs and emotional support. Patient  is OOB to the chair and looks stronger today . Difficult to communicate 2/2 HOH  I spoke to  daughter on the phone  for continued conversation regarding  current medical situation  Education offered again regarding risks and benefits of artificial feeding/ PEG tube.  Education offered that even if a feeding tube was placed and the patient was diligent about maintaining n.p.o. status she is still at risk for aspiration from secretions.  Daughter reiterates that patient has had difficulty swallowing for many years, unintended weight loss of 25 pounds in the last year and vigorous cough for thick secretions for many years also.  No desire for artificial feeding now or in the future   Plan of Care: -DNR/DNI -No artificial feeding now or in the future, hoping that swallow will improve as strength improves    Family recognizes risk of aspiration  -Family requests rehab on discharge if eligible -recommend OP palliative services on discharge  Discussed with patient/daughter  the importance of continued conversation with family and the medical providers regarding overall plan of care and treatment options,  ensuring decisions are within the context of the patients values and GOCs.  Questions and concerns addressed     Total time spent on the unit was 35 minutes  Greater than 50% of the time was spent in counseling and coordination of care  Wadie Lessen NP  Palliative Medicine Team Team Phone # [phone] Pager [phone]

## 2021-06-26 NOTE — Progress Notes (Signed)
  Speech Language Pathology Treatment: Dysphagia  Patient Details Name: Jessica [name] MRN: [id number] DOB: 24-Sep-1926 Today's Date: 06/26/2021 Time: [id number] SLP Time Calculation (min) (ACUTE ONLY): 17 min  Assessment / Plan / Recommendation Clinical Impression  Pt was seen for dysphagia treatment. Pt's RN reported increased RR, signs of aspiration with p.o. intake and increased need for suctioning. Pt's daughter was contacted via phone and she reported that the pt consumed dinner yesterday without coughing, but that her chronic dysphagia is acutely worse with her present illness. Pt presented similarly to when she was last seen by this SLP on 8/29. She demonstrated limited bolus manipulation, and slightly increased signs of aspiration with puree; liquids were refused. Frequent coughing and suctioning was required throughout the session and SLP questions the relatedness of every instance of coughing to swallowing. Pt's daughter has verbalized understanding regarding the severity of pt's symptoms, and aspiration-related complications; she indicated that she is currently prioritizing her mother's desire to eat with understanding of the associated risks. A modified barium swallow study will be conducted to further assess physiology and to better guide treatment. It is currently scheduled for 1400 and pt's daughter is in agreement with this plan.   HPI HPI: 87 year old female with past medical history of COPD, chronic respiratory failure, dysphagia, diastolic congestive heart failure (Echo 03/2020 EF 70-75%), generalized anxiety disorder, severe aortic regurgitation status post TAVR and coronary artery disease (cardiac cath 06/2018  with arthrectomy and DES to LAD) who presents to [facility] emergency department via EMS due to progressively worsening shortness of breath weakness and confusion. Per chart coughing when eating recently. MBS 2/20 sensed aspiration unable to clear, Dys 2/nectar  recommended.      SLP Plan  Continue with current plan of care       Recommendations  Diet recommendations: Dysphagia 1 (puree);Honey-thick liquid Liquids provided via: Cup;Teaspoon;No straw Medication Administration: Crushed with puree Supervision: Staff to assist with self feeding;Full supervision/cueing for compensatory strategies Compensations: Slow rate;Small sips/bites;Follow solids with liquid;Minimize environmental distractions Postural Changes and/or Swallow Maneuvers: Seated upright 90 degrees;Upright 30-60 min after meal                Oral Care Recommendations: Oral care QID;Staff/trained caregiver to provide oral care Follow up Recommendations:  (TBD) SLP Visit Diagnosis: Dysphagia, unspecified (R13.10) Plan: Continue with current plan of care       Hasina Kreager I. Hardin Negus, [place], [place] Office number [phone] Pager [phone]                Horton Marshall 06/26/2021, 10:57 AM

## 2021-06-26 NOTE — Progress Notes (Signed)
Modified Barium Swallow Progress Note  Patient Details  Name: Jessica [name] MRN: [id number] Date of Birth: 01-Sep-1926  Today's Date: 06/26/2021  Modified Barium Swallow completed.  Full report located under Chart Review in the Imaging Section.  Brief recommendations include the following:  Clinical Impression  Pt presents with severe oropharyngeal dysphagia characterized by impaired bolus propulsion, reduced bolus cohesion, reduced lingual retraction, a pharyngeal delay, and reduced anterior laryngeal movement. She demonstrated premature spillage to the valleculae and pyriform sinuses, vallecular residue, pyriform sinus residue, and the swallow was often triggered with >50% of the bolus at the level of pyriform sinuses. Hardware from ACDF noted and facilitated anterior protrusion of the posterior pharyngeal wall with some mild residue. Aspiration (PAS 7) was noted during deglutition with honey thick and nectar thick liquids via cup, straw, or full tsp. Laryngeal invasion was improved to penetration (PAS 3,5) with 1/2 tsp boluses of honey thick liquids and with cued use of a chin tuck posture; however, pt demonstrated difficulty consistently using a chin tuck posture. Pt often sensed instances of penetration and her independent use of coughing was effective in expelling some of the penetrate prior to deglutition. Pt's cough was ineffective in mobilizing aspirate. Pt's swallow function is notably worse than during the last study on 12/16/18. Pt's risk of aspiration is judged to be high before, during, and after deglutition. Pt's aspiration risk has been thoroughly discussed with the pt's daughter by this SLP and various providers. Pt's current diet will be continued with known aspiration risk and with strict observance of swallowing precautions to attempt to reduce risk. SLP will continue to follow pt.   Swallow Evaluation Recommendations       SLP Diet Recommendations: Dysphagia 1 (Puree)  solids;Honey thick liquids   Liquid Administration via: Spoon;No straw   Medication Administration: Crushed with puree       Compensations: Slow rate;Small sips/bites;Follow solids with liquid;Minimize environmental distractions (1/2 tsp boluses of solids and liquids)   Postural Changes: Seated upright at 90 degrees   Oral Care Recommendations: Oral care BID;Staff/trained caregiver to provide oral care   Other Recommendations: Order thickener from [employer] I. Hardin Negus, [place], [place] Office number [phone] Pager [employer] 06/26/2021,3:52 PM

## 2021-06-26 NOTE — Progress Notes (Signed)
  Speech Language Pathology Treatment: Dysphagia  Patient Details Name: Jessica [name] MRN: [id number] DOB: [birth year] Today's Date: 06/26/2021 Time: [id number] SLP Time Calculation (min) (ACUTE ONLY): 32 min  Assessment / Plan / Recommendation Clinical Impression  Pt was seen for dysphagia treatment with her daughter present. Pt and her daughter were educated regarding the results of the modified barium swallow study, diet recommendations, and swallowing precautions. Video recording of the study was used to facilitate education and pt's daughter verbalized understanding regarding all areas of education. Following education, pt's daughter expressed, "I never knew how food could be getting into her lungs" and subsequently, without initiation of this topic by the SLP, asked, "Where would a feeding tube even go? I just don't get it.". Pt's dinner arrived during the session and pt was fed by the pt's daughter. Frequent encouragement was provided by family for pt to eat, but pt consumed ~50% of the meal. Following education, pt's daughter independently reduced bolus sizes to half-tsp. She demonstrated how she typically feeds the pt which included her presenting puree boluses in a spoon upside down. With this method, pt was able to demonstrate a labial seal, and less impaired A-P transport of solid boluses. Coughing was only noted once with purees when swallowing precautions were observed. Coughing persisted with honey thick liquids despite observance of precautions, likely since laryngeal invasion could not be eliminated with this method. Pt was able to demonstrate the chin tuck posture ~50% of the time with verbal and visual cues. Pt's nurse has been advised of all compensatory strategies recommended by SLP and used by pt's daughter and pt's sign in room has been updated. SLP will continue to follow pt.    HPI HPI: 87 year old female with past medical history of COPD, chronic respiratory failure, dysphagia,  diastolic congestive heart failure (Echo 03/2020 EF 70-75%), generalized anxiety disorder, severe aortic regurgitation status post TAVR and coronary artery disease (cardiac cath 06/2018  with arthrectomy and DES to LAD) who presents to [facility] emergency department via EMS due to progressively worsening shortness of breath weakness and confusion. Per chart coughing when eating recently. MBS 2/20 sensed aspiration unable to clear, Dys 2/nectar recommended.      SLP Plan  Continue with current plan of care       Recommendations  Diet recommendations: Dysphagia 1 (puree);Honey-thick liquid Liquids provided via: Teaspoon;No straw Medication Administration: Crushed with puree Supervision: Staff to assist with self feeding;Full supervision/cueing for compensatory strategies Compensations: Slow rate;Small sips/bites;Minimize environmental distractions;Chin tuck (present boluses in upside down spoon) Postural Changes and/or Swallow Maneuvers: Seated upright 90 degrees;Upright 30-60 min after meal                Oral Care Recommendations: Oral care QID;Staff/trained caregiver to provide oral care Follow up Recommendations:  (TBD) SLP Visit Diagnosis: Dysphagia, unspecified (R13.10) Plan: Continue with current plan of care       Sadye Kiernan I. Hardin Negus, [place], [place] Office number [phone] Pager [place] 06/26/2021, 5:56 PM

## 2021-06-26 NOTE — Progress Notes (Signed)
PROGRESS NOTE    Jessica [name] [facility]  [id number] DOB: [birth year] DOA: 06/21/2021 PCP: [name], [name], MD    Brief Narrative:  87 year old with chronic debility, COPD and chronic hypoxemic respiratory failure on 3 L oxygen at home, chronic aspiration with dysphagia, diastolic heart failure, generalized anxiety disorder, severe aortic regurgitation and status post TAVR and coronary artery disease brought to the emergency room with progressive worsening shortness of breath, weakness and confusion.  Patient lives with her daughter who developed COVID-19 infection about 9 days ago and improved.  Patient started symptoms about 2 to 3 days ago with increasing lethargic.  In the emergency room, COVID-19 positive.  Chest x-ray with bilateral pneumonia right more than left.  Temperature 104.9.  Lactic acid 2.5.  Resuscitated with IV fluids.  Started on antibiotics and antiviral therapies and admitted to the hospital.   Assessment & Plan:   Principal Problem:   Acute on chronic respiratory failure with hypoxia (HCC) Active Problems:   Mixed hyperlipidemia   Coronary artery disease involving native coronary artery of native heart without angina pectoris   Chronic diastolic CHF (congestive heart failure) ([place])   COVID-19 virus infection   Aspiration pneumonia of right lung due to gastric secretions (HCC)   Acute metabolic encephalopathy   Lactic acidosis   Sepsis with organ dysfunction ([place])   Dysphagia  Acute on chronic hypoxemic respiratory failure uses 3 L oxygen at home.Pneumonia due to COVID-19 virus infection: Sepsis present on admission with organ dysfunction secondary to aspiration pneumonia. Continue chest physiotherapy, incentive spirometry, deep breathing exercises, sputum induction, mucolytic's and bronchodilators. Supplemental oxygen to keep saturations more than 90%. Covid directed therapy with , Continue dexamethasone for total of 10 days, completing course on 07/02/2021. Completed  remdesivir 5 days on 06/25/2021. antibiotics, on Rocephin and azithromycin.  Suspected significant aspiration.  Inflammatory markers improving.  Patient very comfortable currently on 2 L of oxygen which is her baseline.  Chest x-ray today shows new but small bilateral pleural effusions and no infiltrates.  Completed Rocephin and Zithromax 5-day course on 06/25/2021.  Acute metabolic encephalopathy: Due to above.  Currently resolved.  Chronic diastolic heart failure: Well compensated.  Dehydrated now.   Coronary artery disease: No evidence of ischemia.  Currently on a statin and aspirin.  Hypomagnesemia: 1.6.  Will replace.  Goal of care: Previous hospitalist had a long discussion with the daughter and after discussion and having palliative care on board, CODE STATUS was changed to DNR.  They decided to let her eat honey thick liquids and pured diet with continuous close monitoring. Palliative care on constant communications with family.  I personally spoke to the daughter and she told me that she would like to pursue rehab/SNF for the patient.  DVT prophylaxis: enoxaparin (LOVENOX) injection 30 mg Start: 06/22/21 1000   Code Status: DNR/DNI Family Communication: None at bedside.  Discussed with daughter yesterday on the phone. Disposition Plan: Status is: Inpatient  Remains inpatient appropriate because: Unsafe DC plan  Dispo: The patient is from: Home              Anticipated d/c is to: SNF, unfortunately cannot be discharged until 07/02/2021 due to tested positive for COVID on 06/21/2021              Patient currently is medically stable to d/c.   Difficult to place patient No         Consultants:  None  Procedures:  None  Antimicrobials:  Rocephin, azithromycin, remdesivir 8/26----  Subjective: Seen and examined.  Alert and oriented but very hard of hearing.  No complaints.  Objective: Vitals:   06/25/21 1227 06/25/21 1600 06/25/21 2000 06/26/21 0812  BP: (!) 150/62  (!) 154/57  (!) 157/71  Pulse: (!) 57 (!) 58 69 (!) 52  Resp: '20 19  19  '$ Temp: 97.8 F (36.6 C) 97.8 F (36.6 C) 98 F (36.7 C) 97.9 F (36.6 C)  TempSrc: Oral Oral Oral Oral  SpO2: 96% 95% 95% 96%  Weight:       No intake or output data in the 24 hours ending 06/26/21 1039  Filed Weights   06/24/21 1706  Weight: 47.5 kg    Examination:  General exam: Appears calm and comfortable  Respiratory system: Clear to auscultation. Respiratory effort normal. Cardiovascular system: S1 & S2 heard, RRR. No JVD, murmurs, rubs, gallops or clicks. No pedal edema. Gastrointestinal system: Abdomen is nondistended, soft and nontender. No organomegaly or masses felt. Normal bowel sounds heard. Central nervous system: Alert and oriented. No focal neurological deficits. Extremities: Symmetric 5 x 5 power. Skin: No rashes, lesions or ulcers.   Data Reviewed: I have personally reviewed following labs and imaging studies  CBC: Recent Labs  Lab 06/22/21 0241 06/23/21 0311 06/24/21 0241 06/25/21 0239 06/26/21 0327  WBC 9.1 8.4 3.9* 3.9* 3.2*  NEUTROABS 8.6* 7.1 3.1 2.9 2.3  HGB 9.3* 9.7* 8.9* 9.3* 9.2*  HCT 30.5* 30.6* 28.9* 28.8* 27.6*  MCV 100.7* 97.5 99.0 95.7 92.6  PLT 267 324 340 357 [id number]    Basic Metabolic Panel: Recent Labs  Lab 06/22/21 0241 06/23/21 0311 06/24/21 0241 06/25/21 0239 06/26/21 0327  NA 141 140 140 139 133*  K 4.1 3.3* 3.9 4.3 4.5  CL 106 106 109 106 101  CO2 '26 26 26 27 27  '$ GLUCOSE 173* 104* 114* 69* 91  BUN 28* 31* 28* 21 19  CREATININE 0.74 0.64 0.61 0.57 0.56  CALCIUM 8.6* 8.9 9.0 9.0 8.8*  MG 1.8 1.9 1.8 1.7 1.6*    GFR: Estimated Creatinine Clearance: 32.9 mL/min (by C-G formula based on SCr of 0.56 mg/dL). Liver Function Tests: Recent Labs  Lab 06/22/21 0241 06/23/21 0311 06/24/21 0241 06/25/21 0239 06/26/21 0327  AST '28 26 20 31 '$ 37  ALT '18 16 16 20 28  '$ ALKPHOS 105 93 80 78 74  BILITOT 0.7 0.4 0.6 0.3 0.6  PROT 6.1* 6.3* 5.9* 5.9* 5.5*   ALBUMIN 2.3* 2.3* 2.2* 2.4* 2.2*    No results for input(s): LIPASE, AMYLASE in the last 168 hours. No results for input(s): AMMONIA in the last 168 hours. Coagulation Profile: Recent Labs  Lab 06/21/21 1636 06/22/21 0241  INR 1.0 1.1    Cardiac Enzymes: No results for input(s): CKTOTAL, CKMB, CKMBINDEX, TROPONINI in the last 168 hours. BNP (last 3 results) No results for input(s): PROBNP in the last 8760 hours. HbA1C: No results for input(s): HGBA1C in the last 72 hours. CBG: No results for input(s): GLUCAP in the last 168 hours. Lipid Profile: No results for input(s): CHOL, HDL, LDLCALC, TRIG, CHOLHDL, LDLDIRECT in the last 72 hours. Thyroid Function Tests: No results for input(s): TSH, T4TOTAL, FREET4, T3FREE, THYROIDAB in the last 72 hours. Anemia Panel: No results for input(s): VITAMINB12, FOLATE, FERRITIN, TIBC, IRON, RETICCTPCT in the last 72 hours. Sepsis Labs: Recent Labs  Lab 06/21/21 1610 06/21/21 1810 06/22/21 0241  PROCALCITON  --   --  7.83  LATICACIDVEN 2.5* 1.6  --      Recent Results (  from the past 240 hour(s))  Blood Culture (routine x 2)     Status: None   Collection Time: 06/21/21  4:30 PM   Specimen: BLOOD  Result Value Ref Range Status   Specimen Description BLOOD BLOOD RIGHT FOREARM  Final   Special Requests   Final    BOTTLES DRAWN AEROBIC AND ANAEROBIC Blood Culture adequate volume   Culture   Final    NO GROWTH 5 DAYS Performed at [facility], [address], [place], [place] [address]    Report Status 06/26/2021 FINAL  Final  Blood Culture (routine x 2)     Status: None   Collection Time: 06/21/21  4:35 PM   Specimen: BLOOD  Result Value Ref Range Status   Specimen Description BLOOD RIGHT ANTECUBITAL  Final   Special Requests   Final    BOTTLES DRAWN AEROBIC AND ANAEROBIC Blood Culture results may not be optimal due to an excessive volume of blood received in culture bottles   Culture   Final    NO GROWTH 5 DAYS Performed  at [facility], [place] [address] [place], [place] [address]    Report Status 06/26/2021 FINAL  Final  Resp Panel by RT-PCR (Flu A&B, Covid) Nasopharyngeal Swab     Status: Abnormal   Collection Time: 06/21/21  4:40 PM   Specimen: Nasopharyngeal Swab; Nasopharyngeal(NP) swabs in vial transport medium  Result Value Ref Range Status   SARS Coronavirus 2 by RT PCR POSITIVE (A) NEGATIVE Final    Comment: RESULT CALLED TO, READ BACK BY AND VERIFIED WITH: C,MAS '@1826'$  06/21/21 EB (NOTE) SARS-CoV-2 target nucleic acids are DETECTED.  The SARS-CoV-2 RNA is generally detectable in upper respiratory specimens during the acute phase of infection. Positive results are indicative of the presence of the identified virus, but do not rule out bacterial infection or co-infection with other pathogens not detected by the test. Clinical correlation with patient history and other diagnostic information is necessary to determine patient infection status. The expected result is Negative.  Fact Sheet for Patients: EntrepreneurPulse.com.au  Fact Sheet for Healthcare Providers: IncredibleEmployment.be  This test is not yet approved or cleared by the [place] FDA and  has been authorized for detection and/or diagnosis of SARS-CoV-2 by FDA under an Emergency Use Authorization (EUA).  This EUA will remain in effect (meaning this test can be used) for  the duration of  the COVID-19 declaration under Section 564(b)(1) of the Act, 21 U.S.C. section 360bbb-3(b)(1), unless the authorization is terminated or revoked sooner.     Influenza A by PCR NEGATIVE NEGATIVE Final   Influenza B by PCR NEGATIVE NEGATIVE Final    Comment: (NOTE) The Xpert Xpress SARS-CoV-2/FLU/RSV plus assay is intended as an aid in the diagnosis of influenza from Nasopharyngeal swab specimens and should not be used as a sole basis for treatment. Nasal washings and aspirates are unacceptable for  Xpert Xpress SARS-CoV-2/FLU/RSV testing.  Fact Sheet for Patients: EntrepreneurPulse.com.au  Fact Sheet for Healthcare Providers: IncredibleEmployment.be  This test is not yet approved or cleared by the [place] FDA and has been authorized for detection and/or diagnosis of SARS-CoV-2 by FDA under an Emergency Use Authorization (EUA). This EUA will remain in effect (meaning this test can be used) for the duration of the COVID-19 declaration under Section 564(b)(1) of the Act, 21 U.S.C. section 360bbb-3(b)(1), unless the authorization is terminated or revoked.  Performed at [facility], [place] [address] [place],  [address]  Urine Culture     Status: None   Collection Time: 06/22/21  1:30 AM   Specimen: In/Out Cath Urine  Result Value Ref Range Status   Specimen Description IN/OUT CATH URINE  Final   Special Requests NONE  Final   Culture   Final    NO GROWTH Performed at [facility], [place] [address], [place], [place] [address]    Report Status 06/23/2021 FINAL  Final          Radiology Studies: DG CHEST PORT 1 VIEW  Result Date: 06/25/2021 CLINICAL DATA:  COVID-19 positivity with respiratory failure EXAM: PORTABLE CHEST 1 VIEW COMPARISON:  06/21/2021 FINDINGS: Cardiac shadow is stable. Changes of prior TAVR are seen. Aortic calcifications are again noted. Some increase in the degree of left retrocardiac density is seen when compare with the prior exam. New small pleural effusions are noted bilaterally. No other focal infiltrate is seen. Skin fold is noted over the left chest. Postsurgical changes in the right shoulder are noted. IMPRESSION: New bilateral small effusions.  Mild left retrocardiac opacity. Electronically Signed   By: Inez Catalina M.D.   On: 06/25/2021 08:43        Scheduled Meds:  albuterol  2 puff Inhalation BID   vitamin C  500 mg Oral Daily   aspirin EC  81 mg Oral Daily   dexamethasone  (DECADRON) injection  6 mg Intravenous Q24H   enoxaparin (LOVENOX) injection  30 mg Subcutaneous Q24H   escitalopram  20 mg Oral Daily   fluticasone  2 spray Each Nare Daily   fluticasone furoate-vilanterol  1 puff Inhalation Daily   potassium chloride  40 mEq Oral Daily   zinc sulfate  220 mg Oral Daily   Continuous Infusions:  magnesium sulfate bolus IVPB 2 g (06/26/21 0947)     LOS: 5 days    Time spent: 25 minutes  Darliss Cheney, MD [facility]

## 2021-06-27 LAB — BASIC METABOLIC PANEL
Anion gap: 8 (ref 5–15)
BUN: 16 mg/dL (ref 8–23)
CO2: 27 mmol/L (ref 22–32)
Calcium: 8.8 mg/dL — ABNORMAL LOW (ref 8.9–10.3)
Chloride: 99 mmol/L (ref 98–111)
Creatinine, Ser: 0.6 mg/dL (ref 0.44–1.00)
GFR, Estimated: >60 mL/min (ref >60)
Glucose, Bld: 95 mg/dL (ref 70–99)
Potassium: 3.7 mmol/L (ref 3.5–5.1)
Sodium: 134 mmol/L — ABNORMAL LOW (ref 135–145)

## 2021-06-27 LAB — MAGNESIUM: Magnesium: 1.9 mg/dL (ref 1.7–2.4)

## 2021-06-27 NOTE — NC FL2 (Signed)
[place] LEVEL OF CARE SCREENING TOOL     IDENTIFICATION  Patient Name: Jessica [name] Birthdate: January 20, 1926 Sex: female Admission Date (Current Location): 06/21/2021  [facility] and [place] Number:  Herbalist and Address:  [facility] [place], [place] [address] [place], [place] [address]      Provider Number: [id number]  Attending Physician Name and Address:  Darliss Cheney, MD  Relative Name and Phone Number:  Ernst Spell Daughter [phone]    Current Level of Care: Hospital Recommended Level of Care: [place] Prior Approval Number:    Date Approved/Denied:   PASRR Number: [id number]  Discharge Plan: SNF    Current Diagnoses: Patient Active Problem List   Diagnosis Date Noted   COVID-19 virus infection 06/21/2021   Aspiration pneumonia of right lung due to gastric secretions ([place]) 0000000   Acute metabolic encephalopathy 0000000   Lactic acidosis 06/21/2021   Sepsis with organ dysfunction ([place]) 06/21/2021   Dysphagia 06/21/2021   Pressure injury of skin 09/19/2019   Acute right hip pain 09/17/2019   S/P TAVR (transcatheter aortic valve replacement)    Acute on chronic respiratory failure with hypoxia ([place]) 12/15/2018   Depression 12/15/2018   Coronary artery disease involving native coronary artery of native heart without angina pectoris 12/15/2018   Chronic diastolic CHF (congestive heart failure) ([place]) 12/15/2018   History of pulmonary embolism 08/25/2018   Severe aortic stenosis    Aortic atherosclerosis ([place]) 05/20/2018   Insomnia 04/08/2016   Chronic obstructive pulmonary disease ([place]) 05/07/2015   Labial cyst 05/07/2015   Exertional dyspnea 02/26/2015   Vitamin D deficiency 01/14/2009   Mixed hyperlipidemia 01/14/2009   DEGENERATIVE JOINT DISEASE 06/12/2008   Fibromyalgia 12/15/2007   Generalized anxiety disorder 12/14/2007    Orientation RESPIRATION BLADDER  Height & Weight     Self  O2 External catheter Weight: 104 lb 11.5 oz (47.5 kg) Height:     BEHAVIORAL SYMPTOMS/MOOD NEUROLOGICAL BOWEL NUTRITION STATUS      Incontinent Diet (see discharge summary)  AMBULATORY STATUS COMMUNICATION OF NEEDS Skin   Total Care Verbally Normal                       Personal Care Assistance Level of Assistance  Bathing, Feeding, Dressing Bathing Assistance: Limited assistance Feeding assistance: Limited assistance Dressing Assistance: Limited assistance     Functional Limitations Info  Sight, Hearing, Speech Sight Info: Adequate Hearing Info: Adequate Speech Info: Adequate    SPECIAL CARE FACTORS FREQUENCY  PT (By licensed PT), OT (By licensed OT)     PT Frequency: 5x week OT Frequency: 5x week            Contractures Contractures Info: Not present    Additional Factors Info  Code Status, Allergies Code Status Info: DNR Allergies Info: Contrast Media (Iodinated Diagnostic Agents), Iohexol           Current Medications (06/27/2021):  This is the current hospital active medication list Current Facility-Administered Medications  Medication Dose Route Frequency Provider Last Rate Last Admin   acetaminophen (TYLENOL) tablet 650 mg  650 mg Oral Q6H PRN Shalhoub, Sherryll Burger, MD       albuterol (VENTOLIN HFA) 108 (90 Base) MCG/ACT inhaler 2 puff  2 puff Inhalation Q2H PRN Shalhoub, Sherryll Burger, MD       albuterol (VENTOLIN HFA) 108 (90 Base) MCG/ACT inhaler 2 puff  2 puff Inhalation BID Darliss Cheney, MD  2 puff at 06/27/21 0931   ALPRAZolam Duanne Moron) tablet 0.25 mg  0.25 mg Oral BID PRN Vernelle Emerald, MD   0.25 mg at 06/26/21 2313   ascorbic acid (VITAMIN C) tablet 500 mg  500 mg Oral Daily Shalhoub, Sherryll Burger, MD   500 mg at 06/27/21 0935   aspirin EC tablet 81 mg  81 mg Oral Daily Vernelle Emerald, MD   81 mg at 06/27/21 0935   dexamethasone (DECADRON) injection 6 mg  6 mg Intravenous Q24H Vernelle Emerald, MD   6 mg at 06/27/21  0935   enoxaparin (LOVENOX) injection 30 mg  30 mg Subcutaneous Q24H Vernelle Emerald, MD   30 mg at 06/27/21 0936   escitalopram (LEXAPRO) tablet 20 mg  20 mg Oral Daily Shalhoub, Sherryll Burger, MD   20 mg at 06/27/21 0936   fluticasone (FLONASE) 50 MCG/ACT nasal spray 2 spray  2 spray Each Nare Daily Shalhoub, Sherryll Burger, MD   2 spray at 06/27/21 0935   fluticasone furoate-vilanterol (BREO ELLIPTA) 200-25 MCG/INH 1 puff  1 puff Inhalation Daily Shalhoub, Sherryll Burger, MD   1 puff at 06/27/21 0931   guaiFENesin-dextromethorphan (ROBITUSSIN DM) 100-10 MG/5ML syrup 10 mL  10 mL Oral Q4H PRN Shalhoub, Sherryll Burger, MD       ondansetron [facility] tablet 4 mg  4 mg Oral Q6H PRN Shalhoub, Sherryll Burger, MD       Or   ondansetron [facility]) injection 4 mg  4 mg Intravenous Q6H PRN Shalhoub, Sherryll Burger, MD   4 mg at 06/23/21 0839   polyethylene glycol (MIRALAX / GLYCOLAX) packet 17 g  17 g Oral Daily PRN Shalhoub, Sherryll Burger, MD       potassium chloride SA (KLOR-CON) CR tablet 40 mEq  40 mEq Oral Daily Nita Sells, MD   40 mEq at 06/25/21 [id number]   zinc sulfate capsule 220 mg  220 mg Oral Daily Vernelle Emerald, MD   220 mg at 06/27/21 0935     Discharge Medications: Please see discharge summary for a list of discharge medications.  Relevant Imaging Results:  Relevant Lab Results:   Additional Information SSN [id number].  Pt is vaccinated for covid with 2 boosters.  Joanne Chars, LCSW

## 2021-06-27 NOTE — TOC Initial Note (Signed)
Transition of Care [facility]) - Initial/Assessment Note    Patient Details  Name: Jessica [name] MRN: [id number] Date of Birth: Nov 05, 1925  Transition of Care [facility]) CM/SW Contact:    Joanne Chars, LCSW Phone Number: 06/27/2021, 1:00 PM  Clinical Narrative:     Pt with confusion and in covid isolation.  Assessment completed with pt daughter, [name].  Pt lives with daughter with other family members nearby for support.  Daughter is requesting SNF placement, pt has been to [facility] in past, which would be first choice.  Permission given to fax referral in hub.  Choice document will be placed in room by RN.  Daughter reports she also just had covid and is visiting with pt, can get document there. Current DME in home: walker, wheelchair, shower chair.  Pt is vaccinated for covid with both boosters.              Expected Discharge Plan: Skilled Nursing Facility Barriers to Discharge: Other (must enter comment), SNF Pending bed offer (covid positive, in isolation)   Patient Goals and CMS Choice   CMS Medicare.gov Compare Post Acute Care list provided to:: Patient Represenative (must comment) Choice offered to / list presented to : Adult Children  Expected Discharge Plan and Services Expected Discharge Plan: [place] Choice: [place] arrangements for the past 2 months: Single Family Home                                      Prior Living Arrangements/Services Living arrangements for the past 2 months: Single Family Home Lives with:: Adult Children Patient language and need for interpreter reviewed:: No        Need for Family Participation in Patient Care: Yes (Comment) Care giver support system in place?: Yes (comment) Current home services: Other (comment) (na) Criminal Activity/Legal Involvement Pertinent to Current Situation/Hospitalization: No - Comment as needed  Activities of Daily Living Home Assistive  Devices/Equipment: Wheelchair, Environmental consultant (specify type) (schooter) ADL Screening (condition at time of admission) Patient's cognitive ability adequate to safely complete daily activities?: No Is the patient deaf or have difficulty hearing?: Yes (Hard of hearing) Does the patient have difficulty seeing, even when wearing glasses/contacts?: No Does the patient have difficulty concentrating, remembering, or making decisions?: No Patient able to express need for assistance with ADLs?: Yes Does the patient have difficulty dressing or bathing?: Yes Independently performs ADLs?: Yes (appropriate for developmental age) Does the patient have difficulty walking or climbing stairs?: Yes Weakness of Legs: Both Weakness of Arms/Hands: Both  Permission Sought/Granted                  Emotional Assessment Appearance::  (Pt in covid isolation.  No face to face contact) Attitude/Demeanor/Rapport: Unable to Assess Affect (typically observed): Unable to Assess Orientation: : Oriented to Self Alcohol / Substance Use: Other (comment) Psych Involvement: No (comment)  Admission diagnosis:  Acute on chronic respiratory failure with hypoxia ([place]) [J96.21] COVID [U07.1] COVID-19 virus infection [U07.1] Patient Active Problem List   Diagnosis Date Noted   COVID-19 virus infection 06/21/2021   Aspiration pneumonia of right lung due to gastric secretions ([place]) 0000000   Acute metabolic encephalopathy 0000000   Lactic acidosis 06/21/2021   Sepsis with organ dysfunction ([place]) 06/21/2021   Dysphagia 06/21/2021   Pressure injury of skin 09/19/2019   Acute right hip  pain 09/17/2019   S/P TAVR (transcatheter aortic valve replacement)    Acute on chronic respiratory failure with hypoxia ([place]) 12/15/2018   Depression 12/15/2018   Coronary artery disease involving native coronary artery of native heart without angina pectoris 12/15/2018   Chronic diastolic CHF (congestive heart failure) ([place]) 12/15/2018    History of pulmonary embolism 08/25/2018   Severe aortic stenosis    Aortic atherosclerosis ([place]) 05/20/2018   Insomnia 04/08/2016   Chronic obstructive pulmonary disease ([place]) 05/07/2015   Labial cyst 05/07/2015   Exertional dyspnea 02/26/2015   Vitamin D deficiency 01/14/2009   Mixed hyperlipidemia 01/14/2009   DEGENERATIVE JOINT DISEASE 06/12/2008   Fibromyalgia 12/15/2007   Generalized anxiety disorder 12/14/2007   PCP:  [name], [name], MD Pharmacy:   [facility] [phone] [place], [name] [facility] [place] [id number] [name] [id number] Phone: [phone] Fax: [phone]  [facility] [address] [name] [address] Phone: [phone] Fax: [phone]  [place], [address] [place] [place] [address] Phone: [phone] Fax: [phone]     Social Determinants of Health (SDOH) Interventions    Readmission Risk Interventions Readmission Risk Prevention Plan 04/01/2019  Post Dischage Appt Complete  Medication Screening Complete  Transportation Screening Complete  Some recent data might be hidden

## 2021-06-27 NOTE — Progress Notes (Signed)
Physical Therapy Treatment Patient Details Name: Jessica [name] MRN: [id number] DOB: 12-Sep-1926 Today's Date: 06/27/2021    History of Present Illness 87 yo admitted 8/26 with SOB and lethargy Covid (+) with right lung infiltrates. PMhx: COPD on 3L at home, CHF, TAVR, anxiety, CAD, chronic dysphagia, RT hip IM nail    PT Comments    Pt pleasant and eager to get OOB. Pt with apperance of increased respiratory effort however reports no difficulty with RR 18-24 and SPO2 92-94% on RA with drop to 89% only when holding breath during BM. Pt with slightly increased standing tolerance but continues to fatigue quickly at can only tolerate brief pivot at this time. Pt with assist for pericare and pillow in chair for sacrum. Pt appreciative of therapy and mobility with RN present during session.   HR 68   Follow Up Recommendations  SNF;Supervision/Assistance - 24 hour     Equipment Recommendations  Wheelchair (measurements PT);Wheelchair cushion (measurements PT)    Recommendations for Other Services       Precautions / Restrictions Precautions Precautions: Fall Precaution Comments: HOH, incontinent, covid    Mobility  Bed Mobility Overal bed mobility: Needs Assistance Bed Mobility: Supine to Sit     Supine to sit: Min assist     General bed mobility comments: HOB 40 degrees with assist to move legs to EOB and scoot to EOb with mod multimodal cues and increased time    Transfers Overall transfer level: Needs assistance   Transfers: Sit to/from Stand;Stand Pivot Transfers Sit to Stand: Min assist Stand pivot transfers: Mod assist       General transfer comment: min assist to stand from bed and BSC with mod assist to pivot bed to [facility] with bil UE assist on therapist. Mod assist with use of RW to pivot BSC to recliner with assist to direct. Pt with slightly increased standing tolerance for activity today  Ambulation/Gait             General Gait Details: not yet  able   Stairs             Wheelchair Mobility    Modified Rankin (Stroke Patients Only)       Balance Overall balance assessment: Needs assistance   Sitting balance-Leahy Scale: Fair Sitting balance - Comments: EOB and BSC without UE support with supervision for safety   Standing balance support: Bilateral upper extremity supported Standing balance-Leahy Scale: Poor Standing balance comment: external support and UE assist to maintain standing or RW                            Cognition Arousal/Alertness: Awake/alert Behavior During Therapy: WFL for tasks assessed/performed Overall Cognitive Status: No family/caregiver present to determine baseline cognitive functioning                                 General Comments: HOH, following commands with increased time and assist for mobility      Exercises General Exercises - Lower Extremity Long Arc Quad: AROM;Both;Seated;15 reps Hip Flexion/Marching: AROM;Both;Seated;15 reps    General Comments        Pertinent Vitals/Pain Pain Assessment: No/denies pain Faces Pain Scale: No hurt    Home Living                      Prior Function  PT Goals (current goals can now be found in the care plan section) Progress towards PT goals: Progressing toward goals    Frequency    Min 2X/week      PT Plan Current plan remains appropriate    Co-evaluation              AM-PAC PT "6 Clicks" Mobility   Outcome Measure  Help needed turning from your back to your side while in a flat bed without using bedrails?: A Little Help needed moving from lying on your back to sitting on the side of a flat bed without using bedrails?: A Lot Help needed moving to and from a bed to a chair (including a wheelchair)?: A Lot Help needed standing up from a chair using your arms (e.g., wheelchair or bedside chair)?: A Little Help needed to walk in hospital room?: Total Help needed  climbing 3-5 steps with a railing? : Total 6 Click Score: 12    End of Session   Activity Tolerance: Patient tolerated treatment well Patient left: in chair;with call bell/phone within reach;with chair alarm set Nurse Communication: Mobility status PT Visit Diagnosis: Other abnormalities of gait and mobility (R26.89);Difficulty in walking, not elsewhere classified (R26.2);Muscle weakness (generalized) (M62.81)     Time: [id number] PT Time Calculation (min) (ACUTE ONLY): 35 min  Charges:  $Therapeutic Exercise: 8-22 mins $Therapeutic Activity: 8-22 mins                     Mathan Darroch P, PT Acute Rehabilitation Services Pager: [phone] Office: [place] [facility] 06/27/2021, 1:52 PM

## 2021-06-27 NOTE — Progress Notes (Signed)
  Speech Language Pathology Treatment: Dysphagia  Patient Details Name: Jessica [name] MRN: [id number] DOB: 06-10-1926 Today's Date: 06/27/2021 Time: [id number] SLP Time Calculation (min) (ACUTE ONLY): 12 min  Assessment / Plan / Recommendation Clinical Impression  Pt was seen for dysphagia treatment. She reported that she ate breakfast and has been tolerating it well, but her reliability as a historian is questioned. She consumed puree solids and honey thick liquids via half-tsp boluses using compensatory strategies outlined below. Intermittent coughing was noted, suggesting continued penetration/aspiration. Pt was able to demonstrate functional labial stripping with upside down spoon and demonstrate A-P transport with a mild delay, but cues were not required. With observance of swallowing precautions, pt's tolerance of p.o. intake has improved compared to when she was first seen by this SLP on 8/29 and reduced signs of aspiration are noted. Pt does still remain at high risk of aspiration and swallowing precautions should be strictly observed to reduce aspiration risk. Further acute skilled SLP services are not clinically indicated at this time; however, SLP services are recommended at next level of care (i.e., SNF) for dysphagia management.   HPI HPI: 87 year old female with past medical history of COPD, chronic respiratory failure, dysphagia, diastolic congestive heart failure (Echo 03/2020 EF 70-75%), generalized anxiety disorder, severe aortic regurgitation status post TAVR and coronary artery disease (cardiac cath 06/2018  with arthrectomy and DES to LAD) who presents to [facility] emergency department via EMS due to progressively worsening shortness of breath weakness and confusion. Per chart coughing when eating recently. MBS 2/20 sensed aspiration unable to clear, Dys 2/nectar recommended.      SLP Plan  Continue with current plan of care       Recommendations  Liquids provided  via: Teaspoon;No straw Medication Administration: Crushed with puree Supervision: Staff to assist with self feeding;Full supervision/cueing for compensatory strategies Compensations: Slow rate;Small sips/bites;Minimize environmental distractions;Chin tuck (present boluses in upside down spoon) Postural Changes and/or Swallow Maneuvers: Seated upright 90 degrees;Upright 30-60 min after meal                Oral Care Recommendations: Oral care QID;Staff/trained caregiver to provide oral care Follow up Recommendations: Skilled Nursing facility SLP Visit Diagnosis: Dysphagia, unspecified (R13.10) Plan: Continue with current plan of care       Dara Beidleman I. Hardin Negus, [place], [place] Office number [phone] Pager [place] 06/27/2021, 10:55 AM

## 2021-06-27 NOTE — Progress Notes (Signed)
PROGRESS NOTE    Jessica [name] [facility]  [id number] DOB: Dec 14, 1925 DOA: 06/21/2021 PCP: [name], [name], MD    Brief Narrative:  87 year old with chronic debility, COPD and chronic hypoxemic respiratory failure on 3 L oxygen at home, chronic aspiration with dysphagia, diastolic heart failure, generalized anxiety disorder, severe aortic regurgitation and status post TAVR and coronary artery disease brought to the emergency room with progressive worsening shortness of breath, weakness and confusion.  Patient lives with her daughter who developed COVID-19 infection about 9 days ago and improved.  Patient started symptoms about 2 to 3 days ago with increasing lethargic.  In the emergency room, COVID-19 positive.  Chest x-ray with bilateral pneumonia right more than left.  Temperature 104.9.  Lactic acid 2.5.  Resuscitated with IV fluids.  Started on antibiotics and antiviral therapies and admitted to the hospital.   Assessment & Plan:   Principal Problem:   Acute on chronic respiratory failure with hypoxia (HCC) Active Problems:   Mixed hyperlipidemia   Coronary artery disease involving native coronary artery of native heart without angina pectoris   Chronic diastolic CHF (congestive heart failure) ([place])   COVID-19 virus infection   Aspiration pneumonia of right lung due to gastric secretions (HCC)   Acute metabolic encephalopathy   Lactic acidosis   Sepsis with organ dysfunction ([place])   Dysphagia  Acute on chronic hypoxemic respiratory failure uses 3 L oxygen at home.Pneumonia due to COVID-19 virus infection: Sepsis present on admission with organ dysfunction secondary to aspiration pneumonia.  Patient completed 5 days remdesivir on 06/25/2021, completed Rocephin and Zithromax for aspiration pneumonia on 06/25/2021, continues to be on dexamethasone for total of 10 days ending on 07/02/2021. Continue chest physiotherapy, incentive spirometry, deep breathing exercises, sputum induction, mucolytic's  and bronchodilators. Inflammatory markers improving.  Patient very comfortable currently on 2 L of oxygen which is her baseline.   Acute metabolic encephalopathy: Due to above.  Currently resolved.  Chronic diastolic heart failure: Well compensated.  Dehydrated now.   Coronary artery disease: No evidence of ischemia.  Currently on a statin and aspirin.  Hypomagnesemia: Replaced.  Magnesium level pending today.  Goal of care: Previous hospitalist had a long discussion with the daughter and after discussion and having palliative care on board, CODE STATUS was changed to DNR.  They decided to let her eat honey thick liquids and pured diet with continuous close monitoring. Palliative care on constant communications with family.  I personally spoke to the daughter and she told me that she would like to pursue rehab/SNF for the patient.  DVT prophylaxis: enoxaparin (LOVENOX) injection 30 mg Start: 06/22/21 1000   Code Status: DNR/DNI Family Communication: None at bedside.  Discussed with daughter yesterday on the phone. Disposition Plan: Status is: Inpatient  Remains inpatient appropriate because: Unsafe DC plan  Dispo: The patient is from: Home              Anticipated d/c is to: SNF, unfortunately cannot be discharged until 07/02/2021 due to tested positive for COVID on 06/21/2021              Patient currently is medically stable to d/c.   Difficult to place patient No         Consultants:  None  Procedures:  None  Antimicrobials:  Rocephin, azithromycin, remdesivir 8/26----   Subjective: Seen and examined.  Status quo.  Alert and oriented but very hard of hearing.  Objective: Vitals:   06/26/21 [id number] 06/26/21 1959 06/26/21 2349 06/27/21 0408  BP: (!) 157/71 (!) 114/42 (!) 109/44 (!) 162/78  Pulse: (!) 52 67 61 75  Resp: '19 15 12 '$ (!) 21  Temp: 97.9 F (36.6 C) 98 F (36.7 C) 98 F (36.7 C) 97.8 F (36.6 C)  TempSrc: Oral Oral Oral Oral  SpO2: 96% 100% 98% 100%   Weight:        Intake/Output Summary (Last 24 hours) at 06/27/2021 1055 Last data filed at 06/26/2021 2007 Gross per 24 hour  Intake --  Output 800 ml  Net -800 ml    Filed Weights   06/24/21 1706  Weight: 47.5 kg    Examination:  General exam: Appears calm and comfortable  Respiratory system: Clear to auscultation. Respiratory effort normal. Cardiovascular system: S1 & S2 heard, RRR. No JVD, murmurs, rubs, gallops or clicks. No pedal edema. Gastrointestinal system: Abdomen is nondistended, soft and nontender. No organomegaly or masses felt. Normal bowel sounds heard. Central nervous system: Alert and oriented. No focal neurological deficits. Extremities: Symmetric 5 x 5 power. Skin: No rashes, lesions or ulcers.     Data Reviewed: I have personally reviewed following labs and imaging studies  CBC: Recent Labs  Lab 06/22/21 0241 06/23/21 0311 06/24/21 0241 06/25/21 0239 06/26/21 0327  WBC 9.1 8.4 3.9* 3.9* 3.2*  NEUTROABS 8.6* 7.1 3.1 2.9 2.3  HGB 9.3* 9.7* 8.9* 9.3* 9.2*  HCT 30.5* 30.6* 28.9* 28.8* 27.6*  MCV 100.7* 97.5 99.0 95.7 92.6  PLT 267 324 340 357 [id number]    Basic Metabolic Panel: Recent Labs  Lab 06/22/21 0241 06/23/21 0311 06/24/21 0241 06/25/21 0239 06/26/21 0327  NA 141 140 140 139 133*  K 4.1 3.3* 3.9 4.3 4.5  CL 106 106 109 106 101  CO2 '26 26 26 27 27  '$ GLUCOSE 173* 104* 114* 69* 91  BUN 28* 31* 28* 21 19  CREATININE 0.74 0.64 0.61 0.57 0.56  CALCIUM 8.6* 8.9 9.0 9.0 8.8*  MG 1.8 1.9 1.8 1.7 1.6*    GFR: Estimated Creatinine Clearance: 32.9 mL/min (by C-G formula based on SCr of 0.56 mg/dL). Liver Function Tests: Recent Labs  Lab 06/22/21 0241 06/23/21 0311 06/24/21 0241 06/25/21 0239 06/26/21 0327  AST '28 26 20 31 '$ 37  ALT '18 16 16 20 28  '$ ALKPHOS 105 93 80 78 74  BILITOT 0.7 0.4 0.6 0.3 0.6  PROT 6.1* 6.3* 5.9* 5.9* 5.5*  ALBUMIN 2.3* 2.3* 2.2* 2.4* 2.2*    No results for input(s): LIPASE, AMYLASE in the last 168 hours. No  results for input(s): AMMONIA in the last 168 hours. Coagulation Profile: Recent Labs  Lab 06/21/21 1636 06/22/21 0241  INR 1.0 1.1    Cardiac Enzymes: No results for input(s): CKTOTAL, CKMB, CKMBINDEX, TROPONINI in the last 168 hours. BNP (last 3 results) No results for input(s): PROBNP in the last 8760 hours. HbA1C: No results for input(s): HGBA1C in the last 72 hours. CBG: No results for input(s): GLUCAP in the last 168 hours. Lipid Profile: No results for input(s): CHOL, HDL, LDLCALC, TRIG, CHOLHDL, LDLDIRECT in the last 72 hours. Thyroid Function Tests: No results for input(s): TSH, T4TOTAL, FREET4, T3FREE, THYROIDAB in the last 72 hours. Anemia Panel: No results for input(s): VITAMINB12, FOLATE, FERRITIN, TIBC, IRON, RETICCTPCT in the last 72 hours. Sepsis Labs: Recent Labs  Lab 06/21/21 1610 06/21/21 1810 06/22/21 0241  PROCALCITON  --   --  7.83  LATICACIDVEN 2.5* 1.6  --      Recent Results (from the past 240 hour(s))  Blood Culture (  routine x 2)     Status: None   Collection Time: 06/21/21  4:30 PM   Specimen: BLOOD  Result Value Ref Range Status   Specimen Description BLOOD BLOOD RIGHT FOREARM  Final   Special Requests   Final    BOTTLES DRAWN AEROBIC AND ANAEROBIC Blood Culture adequate volume   Culture   Final    NO GROWTH 5 DAYS Performed at [facility], [address], [place], [place] [address]    Report Status 06/26/2021 FINAL  Final  Blood Culture (routine x 2)     Status: None   Collection Time: 06/21/21  4:35 PM   Specimen: BLOOD  Result Value Ref Range Status   Specimen Description BLOOD RIGHT ANTECUBITAL  Final   Special Requests   Final    BOTTLES DRAWN AEROBIC AND ANAEROBIC Blood Culture results may not be optimal due to an excessive volume of blood received in culture bottles   Culture   Final    NO GROWTH 5 DAYS Performed at [facility], [place] [address], [place], [place] [address]    Report Status 06/26/2021 FINAL  Final   Resp Panel by RT-PCR (Flu A&B, Covid) Nasopharyngeal Swab     Status: Abnormal   Collection Time: 06/21/21  4:40 PM   Specimen: Nasopharyngeal Swab; Nasopharyngeal(NP) swabs in vial transport medium  Result Value Ref Range Status   SARS Coronavirus 2 by RT PCR POSITIVE (A) NEGATIVE Final    Comment: RESULT CALLED TO, READ BACK BY AND VERIFIED WITH: C,MAS '@1826'$  06/21/21 EB (NOTE) SARS-CoV-2 target nucleic acids are DETECTED.  The SARS-CoV-2 RNA is generally detectable in upper respiratory specimens during the acute phase of infection. Positive results are indicative of the presence of the identified virus, but do not rule out bacterial infection or co-infection with other pathogens not detected by the test. Clinical correlation with patient history and other diagnostic information is necessary to determine patient infection status. The expected result is Negative.  Fact Sheet for Patients: EntrepreneurPulse.com.au  Fact Sheet for Healthcare Providers: IncredibleEmployment.be  This test is not yet approved or cleared by the [place] FDA and  has been authorized for detection and/or diagnosis of SARS-CoV-2 by FDA under an Emergency Use Authorization (EUA).  This EUA will remain in effect (meaning this test can be used) for  the duration of  the COVID-19 declaration under Section 564(b)(1) of the Act, 21 U.S.C. section 360bbb-3(b)(1), unless the authorization is terminated or revoked sooner.     Influenza A by PCR NEGATIVE NEGATIVE Final   Influenza B by PCR NEGATIVE NEGATIVE Final    Comment: (NOTE) The Xpert Xpress SARS-CoV-2/FLU/RSV plus assay is intended as an aid in the diagnosis of influenza from Nasopharyngeal swab specimens and should not be used as a sole basis for treatment. Nasal washings and aspirates are unacceptable for Xpert Xpress SARS-CoV-2/FLU/RSV testing.  Fact Sheet for  Patients: EntrepreneurPulse.com.au  Fact Sheet for Healthcare Providers: IncredibleEmployment.be  This test is not yet approved or cleared by the [place] FDA and has been authorized for detection and/or diagnosis of SARS-CoV-2 by FDA under an Emergency Use Authorization (EUA). This EUA will remain in effect (meaning this test can be used) for the duration of the COVID-19 declaration under Section 564(b)(1) of the Act, 21 U.S.C. section 360bbb-3(b)(1), unless the authorization is terminated or revoked.  Performed at [facility], [place] [address], [place], [place] [address]   Urine Culture     Status:  None   Collection Time: 06/22/21  1:30 AM   Specimen: In/Out Cath Urine  Result Value Ref Range Status   Specimen Description IN/OUT CATH URINE  Final   Special Requests NONE  Final   Culture   Final    NO GROWTH Performed at [facility], [place] [address] [place], [place] [address]    Report Status 06/23/2021 FINAL  Final          Radiology Studies: DG Swallowing Func-Speech Pathology  Result Date: 06/26/2021 Table formatting from the original result was not included. Objective Swallowing Evaluation: Type of Study: Bedside Swallow Evaluation  Patient Details Name: Jessica [name] MRN: [id number] Date of Birth: 1926/07/21 Today's Date: 06/26/2021 Time: SLP Start Time (ACUTE ONLY): 49 -SLP Stop Time (ACUTE ONLY): 1500 SLP Time Calculation (min) (ACUTE ONLY): 25 min Past Medical History: Past Medical History: Diagnosis Date  Anxiety   congenital nystagmus   COPD (chronic obstructive pulmonary disease) (HCC)   Coronary artery disease   DJD (degenerative joint disease)   Fibromyalgia   Low back pain syndrome   Memory loss   Other and unspecified hyperlipidemia   Pancreatic lesion   Pneumonia   S/P TAVR (transcatheter aortic valve replacement)   26 mm Edwards Sapien 3 via the TF approach  Severe aortic stenosis   Venous insufficiency   Past Surgical History: Past Surgical History: Procedure Laterality Date  ABDOMINAL HYSTERECTOMY    ANTERIOR CERVICAL DISCECTOMY    APPENDECTOMY    CARPAL TUNNEL RELEASE  12/2011  right arm  CATARACT EXTRACTION    CORONARY ATHERECTOMY  07/23/2018  PTCA/orbital atherectomy/DES x 1 proximal to mid LAD  CORONARY ATHERECTOMY N/A 07/23/2018  Procedure: CORONARY ATHERECTOMY;  Surgeon: Burnell Blanks, MD;  Location: [facility];  Service: Cardiovascular;  Laterality: N/A;  CORONARY STENT INTERVENTION    CORONARY STENT INTERVENTION N/A 07/23/2018  Procedure: CORONARY STENT INTERVENTION;  Surgeon: Burnell Blanks, MD;  Location: [facility] [facility];  Service: Cardiovascular;  Laterality: N/A;  EYE SURGERY    INTRAMEDULLARY (IM) NAIL INTERTROCHANTERIC Right 09/19/2019  Procedure: INTRAMEDULLARY (IM) NAIL INTERTROCHANTRIC;  Surgeon: Renette Butters, MD;  Location: [facility];  Service: Orthopedics;  Laterality: Right;  LUMBAR LAMINECTOMY    right knee arthroscopy    right shoulder replacement  04/2009  right shoulder surgery  2008  Dr. Noemi Chapel  RIGHT/LEFT HEART CATH AND CORONARY ANGIOGRAPHY N/A 07/07/2018  Procedure: RIGHT/LEFT HEART CATH AND CORONARY ANGIOGRAPHY;  Surgeon: Burnell Blanks, MD;  Location: [facility];  Service: Cardiovascular;  Laterality: N/A;  TEE WITHOUT CARDIOVERSION N/A 03/29/2019  Procedure: TRANSESOPHAGEAL ECHOCARDIOGRAM (TEE);  Surgeon: Burnell Blanks, MD;  Location: [place];  Service: Open Heart Surgery;  Laterality: N/A;  TRANSCATHETER AORTIC VALVE REPLACEMENT, TRANSFEMORAL N/A 03/29/2019  Procedure: TRANSCATHETER AORTIC VALVE REPLACEMENT, TRANSFEMORAL;  Surgeon: Burnell Blanks, MD;  Location: [facility];  Service: Open Heart Surgery;  Laterality: N/A;  ULTRASOUND GUIDANCE FOR VASCULAR ACCESS  07/07/2018  Procedure: Ultrasound Guidance For Vascular Access;  Surgeon: Burnell Blanks, MD;  Location: [facility];  Service: Cardiovascular;; HPI:  87 year old female with past medical history of COPD, chronic respiratory failure, dysphagia, diastolic congestive heart failure (Echo 03/2020 EF 70-75%), generalized anxiety disorder, severe aortic regurgitation status post TAVR and coronary artery disease (cardiac cath 06/2018  with arthrectomy and DES to LAD) who presents to [facility] emergency department via EMS due to progressively worsening shortness of breath weakness and confusion. Per chart  coughing when eating recently. MBS 12/16/18: moderate pharyngeal dysphagia marked by delayed initiation of swallow at pyriforms/vallecula, inconsistent clearance of penetration/aspiration, and suspicion of esophageal dysfunction; sensed aspiration unable to clear, Dys 2/nectar recommended.  No data recorded Assessment / Plan / Recommendation CHL IP CLINICAL IMPRESSIONS 06/26/2021 Clinical Impression Pt presents with severe oropharyngeal dysphagia characterized by impaired bolus propulsion, reduced bolus cohesion, reduced lingual retraction, a pharyngeal delay, and reduced anterior laryngeal movement. She demonstrated premature spillage to the valleculae and pyriform sinuses, vallecular residue, pyriform sinus residue, and the swallow was often triggered with >50% of the bolus at the level of pyriform sinuses. Hardware from ACDF noted and facilitated anterior protrusion of the posterior pharyngeal wall with some mild residue. Aspiration (PAS 7) was noted during deglutition with honey thick and nectar thick liquids via cup, straw, or full tsp. Laryngeal invasion was improved to penetration (PAS 3,5) with 1/2 tsp boluses of honey thick liquids and with cued use of a chin tuck posture; however, pt demonstrated difficulty consistently using a chin tuck posture. Pt often sensed instances of penetration and her independent use of coughing was effective in expelling some of the penetrate prior to deglutition. Pt's cough was ineffective in mobilizing aspirate. Pt's swallow  function is notably worse than during the last study on 12/16/18. Pt's risk of aspiration is judged to be high before, during, and after deglutition. Pt's aspiration risk has been thoroughly discussed with the pt's daughter by this SLP and various providers. Pt's current diet will be continued with known aspiration risk and with strict observance of swallowing precautions to attempt to reduce risk. SLP will continue to follow pt. SLP Visit Diagnosis Dysphagia, unspecified (R13.10) Attention and concentration deficit following -- Frontal lobe and executive function deficit following -- Impact on safety and function Severe aspiration risk;Risk for inadequate nutrition/hydration   CHL IP TREATMENT RECOMMENDATION 06/26/2021 Treatment Recommendations Therapy as outlined in treatment plan below   Prognosis 06/26/2021 Prognosis for Safe Diet Advancement Guarded Barriers to Reach Goals Cognitive deficits;Severity of deficits;Time post onset Barriers/Prognosis Comment -- CHL IP DIET RECOMMENDATION 06/26/2021 SLP Diet Recommendations Dysphagia 1 (Puree) solids;Honey thick liquids Liquid Administration via Spoon;No straw Medication Administration Crushed with puree Compensations Slow rate;Small sips/bites;Follow solids with liquid;Minimize environmental distractions Postural Changes Seated upright at 90 degrees   CHL IP OTHER RECOMMENDATIONS 06/26/2021 Recommended Consults -- Oral Care Recommendations Oral care BID;Staff/trained caregiver to provide oral care Other Recommendations Order thickener from pharmacy   CHL IP FOLLOW UP RECOMMENDATIONS 06/26/2021 Follow up Recommendations Skilled Nursing facility   [facility] IP FREQUENCY AND DURATION 06/26/2021 Speech Therapy Frequency (ACUTE ONLY) min 2x/week Treatment Duration 2 weeks      CHL IP ORAL PHASE 06/26/2021 Oral Phase Impaired Oral - Pudding Teaspoon -- Oral - Pudding Cup -- Oral - Honey Teaspoon Decreased bolus cohesion;Premature spillage;Reduced posterior propulsion Oral - Honey Cup  Decreased bolus cohesion;Premature spillage;Reduced posterior propulsion Oral - Nectar Teaspoon Decreased bolus cohesion;Premature spillage;Reduced posterior propulsion Oral - Nectar Cup Decreased velopharyngeal closure Oral - Nectar Straw -- Oral - Thin Teaspoon -- Oral - Thin Cup -- Oral - Thin Straw -- Oral - Puree Decreased bolus cohesion;Premature spillage;Reduced posterior propulsion Oral - Mech Soft -- Oral - Regular -- Oral - Multi-Consistency -- Oral - Pill -- Oral Phase - Comment --  CHL IP PHARYNGEAL PHASE 06/26/2021 Pharyngeal Phase Impaired Pharyngeal- Pudding Teaspoon -- Pharyngeal -- Pharyngeal- Pudding Cup -- Pharyngeal -- Pharyngeal- Honey Teaspoon -- Pharyngeal -- Pharyngeal- Honey Cup Pharyngeal residue - valleculae;Pharyngeal residue -  pyriform;Moderate aspiration;Significant aspiration (Amount);Penetration/Aspiration during swallow;Penetration/Apiration after swallow;Penetration/Aspiration before swallow;Delayed swallow initiation-pyriform sinuses;Reduced anterior laryngeal mobility;Reduced tongue base retraction Pharyngeal Material enters airway, passes BELOW cords and not ejected out despite cough attempt by patient Pharyngeal- Nectar Teaspoon Pharyngeal residue - valleculae;Pharyngeal residue - pyriform;Moderate aspiration;Significant aspiration (Amount);Penetration/Aspiration during swallow;Penetration/Apiration after swallow;Penetration/Aspiration before swallow;Delayed swallow initiation-pyriform sinuses;Reduced tongue base retraction;Reduced anterior laryngeal mobility Pharyngeal Material enters airway, passes BELOW cords and not ejected out despite cough attempt by patient;Material enters airway, CONTACTS cords and not ejected out;Material enters airway, remains ABOVE vocal cords and not ejected out Pharyngeal- Nectar Cup Pharyngeal residue - valleculae;Pharyngeal residue - pyriform;Moderate aspiration;Significant aspiration (Amount);Penetration/Aspiration during  swallow;Penetration/Apiration after swallow;Penetration/Aspiration before swallow;Delayed swallow initiation-pyriform sinuses;Reduced anterior laryngeal mobility;Reduced tongue base retraction Pharyngeal Material enters airway, passes BELOW cords and not ejected out despite cough attempt by patient Pharyngeal- Nectar Straw -- Pharyngeal -- Pharyngeal- Thin Teaspoon -- Pharyngeal -- Pharyngeal- Thin Cup -- Pharyngeal -- Pharyngeal- Thin Straw -- Pharyngeal -- Pharyngeal- Puree Pharyngeal residue - valleculae;Pharyngeal residue - pyriform;Delayed swallow initiation-pyriform sinuses;Reduced tongue base retraction;Reduced anterior laryngeal mobility Pharyngeal -- Pharyngeal- Mechanical Soft -- Pharyngeal -- Pharyngeal- Regular -- Pharyngeal -- Pharyngeal- Multi-consistency -- Pharyngeal -- Pharyngeal- Pill -- Pharyngeal -- Pharyngeal Comment --  CHL IP CERVICAL ESOPHAGEAL PHASE 12/16/2018 Cervical Esophageal Phase Impaired Pudding Teaspoon -- Pudding Cup -- Honey Teaspoon -- Honey Cup -- Nectar Teaspoon -- Nectar Cup -- Nectar Straw -- Thin Teaspoon -- Thin Cup -- Thin Straw -- Puree -- Mechanical Soft -- Regular -- Multi-consistency -- Pill -- Cervical Esophageal Comment -- Shanika I. Hardin Negus, [place], [place] Office number [phone] Pager [place] 06/26/2021, 3:59 PM                   Scheduled Meds:  albuterol  2 puff Inhalation BID   vitamin C  500 mg Oral Daily   aspirin EC  81 mg Oral Daily   dexamethasone (DECADRON) injection  6 mg Intravenous Q24H   enoxaparin (LOVENOX) injection  30 mg Subcutaneous Q24H   escitalopram  20 mg Oral Daily   fluticasone  2 spray Each Nare Daily   fluticasone furoate-vilanterol  1 puff Inhalation Daily   potassium chloride  40 mEq Oral Daily   zinc sulfate  220 mg Oral Daily   Continuous Infusions:     LOS: 6 days    Time spent: 25 minutes  Darliss Cheney, MD [facility]

## 2021-06-28 MED ORDER — ADULT MULTIVITAMIN W/MINERALS CH
1.0000 | ORAL_TABLET | Freq: Every day | ORAL | Status: DC
  Administered 2021-06-28 – 2021-07-02 (×5): 1 via ORAL
  Filled 2021-06-28 (×5): qty 1

## 2021-06-28 NOTE — Progress Notes (Signed)
PROGRESS NOTE    Atavia Vanbelle [facility] ([facility])  [id number] DOB: [birth year] DOA: 06/21/2021 PCP: [name], [name], MD    Brief Narrative:  87 year old with chronic debility, COPD and chronic hypoxemic respiratory failure on 3 L oxygen at home, chronic aspiration with dysphagia, diastolic heart failure, generalized anxiety disorder, severe aortic regurgitation and status post TAVR and coronary artery disease brought to the emergency room with progressive worsening shortness of breath, weakness and confusion.  Patient lives with her daughter who developed COVID-19 infection about 9 days ago and improved.  Patient started symptoms about 2 to 3 days ago with increasing lethargic.  In the emergency room, COVID-19 positive.  Chest x-ray with bilateral pneumonia right more than left.  Temperature 104.9.  Lactic acid 2.5.  Resuscitated with IV fluids.  Started on antibiotics and antiviral therapies and admitted to the hospital.   Assessment & Plan:   Principal Problem:   Acute on chronic respiratory failure with hypoxia (HCC) Active Problems:   Mixed hyperlipidemia   Coronary artery disease involving native coronary artery of native heart without angina pectoris   Chronic diastolic CHF (congestive heart failure) ([place])   COVID-19 virus infection   Aspiration pneumonia of right lung due to gastric secretions (HCC)   Acute metabolic encephalopathy   Lactic acidosis   Sepsis with organ dysfunction ([place])   Dysphagia  Acute on chronic hypoxemic respiratory failure uses 3 L oxygen at home.Pneumonia due to COVID-19 virus infection: Sepsis present on admission with organ dysfunction secondary to aspiration pneumonia.  Patient completed 5 days remdesivir on 06/25/2021, completed Rocephin and Zithromax for aspiration pneumonia on 06/25/2021, continues to be on dexamethasone for total of 10 days ending on 07/02/2021. Continue chest physiotherapy, incentive spirometry, deep breathing exercises, sputum induction, mucolytic's  and bronchodilators. Inflammatory markers improving.  Patient very comfortable currently on 2 L of oxygen which is her baseline.   Acute metabolic encephalopathy: Due to above.  Currently resolved.  Chronic diastolic heart failure: Well compensated.  Dehydrated now.   Coronary artery disease: No evidence of ischemia.  Currently on a statin and aspirin.  Hypomagnesemia: Resolved  Chronic dysphagia: SLP on board.  MBS done during this admission.  On dysphagia 1 diet.  Goal of care: Previous hospitalist had a long discussion with the daughter and after discussion and having palliative care on board, CODE STATUS was changed to DNR.  They decided to let her eat honey thick liquids and pured diet with continuous close monitoring. Palliative care on constant communications with family.  I personally spoke to the daughter and she told me that she would like to pursue rehab/SNF for the patient.  DVT prophylaxis: enoxaparin (LOVENOX) injection 30 mg Start: 06/22/21 1000   Code Status: DNR/DNI Family Communication: None at bedside.   Disposition Plan: Status is: Inpatient  Remains inpatient appropriate because: Unsafe DC plan  Dispo: The patient is from: Home              Anticipated d/c is to: SNF, unfortunately cannot be discharged until 07/02/2021 due to tested positive for COVID on 06/21/2021              Patient currently is medically stable to d/c.   Difficult to place patient No         Consultants:  None  Procedures:  None  Antimicrobials:  Rocephin, azithromycin, remdesivir 8/26----   Subjective: Seen and examined.  No change from yesterday.  Alert and oriented but very hard of hearing.  No complaints.  She  was not playing comfortably in her bed and she was asking for help.  Charge nurse was asked to send patient's nurse in the room.  Objective: Vitals:   06/27/21 1200 06/27/21 1600 06/27/21 1940 06/27/21 2000  BP: (!) 104/53 110/62 (!) 105/46   Pulse: 64 63 65 66   Resp: (!) '24 19 17   '$ Temp:  97.9 F (36.6 C) 97.8 F (36.6 C) 98 F (36.7 C)  TempSrc:  Oral Oral Oral  SpO2: 95% 98% 100% 99%  Weight:       No intake or output data in the 24 hours ending 06/28/21 1028  Filed Weights   06/24/21 1706  Weight: 47.5 kg    Examination:  General exam: Appears calm and comfortable  Respiratory system: Clear to auscultation. Respiratory effort normal. Cardiovascular system: S1 & S2 heard, RRR. No JVD, murmurs, rubs, gallops or clicks. No pedal edema. Gastrointestinal system: Abdomen is nondistended, soft and nontender. No organomegaly or masses felt. Normal bowel sounds heard. Central nervous system: Alert and oriented. No focal neurological deficits. Extremities: Symmetric 5 x 5 power.   Data Reviewed: I have personally reviewed following labs and imaging studies  CBC: Recent Labs  Lab 06/22/21 0241 06/23/21 0311 06/24/21 0241 06/25/21 0239 06/26/21 0327  WBC 9.1 8.4 3.9* 3.9* 3.2*  NEUTROABS 8.6* 7.1 3.1 2.9 2.3  HGB 9.3* 9.7* 8.9* 9.3* 9.2*  HCT 30.5* 30.6* 28.9* 28.8* 27.6*  MCV 100.7* 97.5 99.0 95.7 92.6  PLT 267 324 340 357 [id number]    Basic Metabolic Panel: Recent Labs  Lab 06/23/21 0311 06/24/21 0241 06/25/21 0239 06/26/21 0327 06/27/21 0958  NA 140 140 139 133* 134*  K 3.3* 3.9 4.3 4.5 3.7  CL 106 109 106 101 99  CO2 '26 26 27 27 27  '$ GLUCOSE 104* 114* 69* 91 95  BUN 31* 28* '21 19 16  '$ CREATININE 0.64 0.61 0.57 0.56 0.60  CALCIUM 8.9 9.0 9.0 8.8* 8.8*  MG 1.9 1.8 1.7 1.6* 1.9    GFR: Estimated Creatinine Clearance: 32.9 mL/min (by C-G formula based on SCr of 0.6 mg/dL). Liver Function Tests: Recent Labs  Lab 06/22/21 0241 06/23/21 0311 06/24/21 0241 06/25/21 0239 06/26/21 0327  AST '28 26 20 31 '$ 37  ALT '18 16 16 20 28  '$ ALKPHOS 105 93 80 78 74  BILITOT 0.7 0.4 0.6 0.3 0.6  PROT 6.1* 6.3* 5.9* 5.9* 5.5*  ALBUMIN 2.3* 2.3* 2.2* 2.4* 2.2*    No results for input(s): LIPASE, AMYLASE in the last 168 hours. No  results for input(s): AMMONIA in the last 168 hours. Coagulation Profile: Recent Labs  Lab 06/21/21 1636 06/22/21 0241  INR 1.0 1.1    Cardiac Enzymes: No results for input(s): CKTOTAL, CKMB, CKMBINDEX, TROPONINI in the last 168 hours. BNP (last 3 results) No results for input(s): PROBNP in the last 8760 hours. HbA1C: No results for input(s): HGBA1C in the last 72 hours. CBG: No results for input(s): GLUCAP in the last 168 hours. Lipid Profile: No results for input(s): CHOL, HDL, LDLCALC, TRIG, CHOLHDL, LDLDIRECT in the last 72 hours. Thyroid Function Tests: No results for input(s): TSH, T4TOTAL, FREET4, T3FREE, THYROIDAB in the last 72 hours. Anemia Panel: No results for input(s): VITAMINB12, FOLATE, FERRITIN, TIBC, IRON, RETICCTPCT in the last 72 hours. Sepsis Labs: Recent Labs  Lab 06/21/21 1610 06/21/21 1810 06/22/21 0241  PROCALCITON  --   --  7.83  LATICACIDVEN 2.5* 1.6  --      Recent Results (from the past  240 hour(s))  Blood Culture (routine x 2)     Status: None   Collection Time: 06/21/21  4:30 PM   Specimen: BLOOD  Result Value Ref Range Status   Specimen Description BLOOD BLOOD RIGHT FOREARM  Final   Special Requests   Final    BOTTLES DRAWN AEROBIC AND ANAEROBIC Blood Culture adequate volume   Culture   Final    NO GROWTH 5 DAYS Performed at [facility], [address] [place], [place] [address]    Report Status 06/26/2021 FINAL  Final  Blood Culture (routine x 2)     Status: None   Collection Time: 06/21/21  4:35 PM   Specimen: BLOOD  Result Value Ref Range Status   Specimen Description BLOOD RIGHT ANTECUBITAL  Final   Special Requests   Final    BOTTLES DRAWN AEROBIC AND ANAEROBIC Blood Culture results may not be optimal due to an excessive volume of blood received in culture bottles   Culture   Final    NO GROWTH 5 DAYS Performed at [facility], [place] [address], [place], [place] [address]    Report Status 06/26/2021 FINAL  Final   Resp Panel by RT-PCR (Flu A&B, Covid) Nasopharyngeal Swab     Status: Abnormal   Collection Time: 06/21/21  4:40 PM   Specimen: Nasopharyngeal Swab; Nasopharyngeal(NP) swabs in vial transport medium  Result Value Ref Range Status   SARS Coronavirus 2 by RT PCR POSITIVE (A) NEGATIVE Final    Comment: RESULT CALLED TO, READ BACK BY AND VERIFIED WITH: C,MAS '@1826'$  06/21/21 EB (NOTE) SARS-CoV-2 target nucleic acids are DETECTED.  The SARS-CoV-2 RNA is generally detectable in upper respiratory specimens during the acute phase of infection. Positive results are indicative of the presence of the identified virus, but do not rule out bacterial infection or co-infection with other pathogens not detected by the test. Clinical correlation with patient history and other diagnostic information is necessary to determine patient infection status. The expected result is Negative.  Fact Sheet for Patients: EntrepreneurPulse.com.au  Fact Sheet for Healthcare Providers: IncredibleEmployment.be  This test is not yet approved or cleared by the [place] FDA and  has been authorized for detection and/or diagnosis of SARS-CoV-2 by FDA under an Emergency Use Authorization (EUA).  This EUA will remain in effect (meaning this test can be used) for  the duration of  the COVID-19 declaration under Section 564(b)(1) of the Act, 21 U.S.C. section 360bbb-3(b)(1), unless the authorization is terminated or revoked sooner.     Influenza A by PCR NEGATIVE NEGATIVE Final   Influenza B by PCR NEGATIVE NEGATIVE Final    Comment: (NOTE) The Xpert Xpress SARS-CoV-2/FLU/RSV plus assay is intended as an aid in the diagnosis of influenza from Nasopharyngeal swab specimens and should not be used as a sole basis for treatment. Nasal washings and aspirates are unacceptable for Xpert Xpress SARS-CoV-2/FLU/RSV testing.  Fact Sheet for  Patients: EntrepreneurPulse.com.au  Fact Sheet for Healthcare Providers: IncredibleEmployment.be  This test is not yet approved or cleared by the [place] FDA and has been authorized for detection and/or diagnosis of SARS-CoV-2 by FDA under an Emergency Use Authorization (EUA). This EUA will remain in effect (meaning this test can be used) for the duration of the COVID-19 declaration under Section 564(b)(1) of the Act, 21 U.S.C. section 360bbb-3(b)(1), unless the authorization is terminated or revoked.  Performed at [facility], [place] [address], [place], [place] [address]   Urine Culture  Status: None   Collection Time: 06/22/21  1:30 AM   Specimen: In/Out Cath Urine  Result Value Ref Range Status   Specimen Description IN/OUT CATH URINE  Final   Special Requests NONE  Final   Culture   Final    NO GROWTH Performed at [facility], [place] [address] [place], [place] [address]    Report Status 06/23/2021 FINAL  Final          Radiology Studies: DG Swallowing Func-Speech Pathology  Result Date: 06/26/2021 Table formatting from the original result was not included. Objective Swallowing Evaluation: Type of Study: Bedside Swallow Evaluation  Patient Details Name: LILIJANA STOCUM MRN: [id number] Date of Birth: 10-03-1926 Today's Date: 06/26/2021 Time: SLP Start Time (ACUTE ONLY): 90 -SLP Stop Time (ACUTE ONLY): 1500 SLP Time Calculation (min) (ACUTE ONLY): 25 min Past Medical History: Past Medical History: Diagnosis Date  Anxiety   congenital nystagmus   COPD (chronic obstructive pulmonary disease) (HCC)   Coronary artery disease   DJD (degenerative joint disease)   Fibromyalgia   Low back pain syndrome   Memory loss   Other and unspecified hyperlipidemia   Pancreatic lesion   Pneumonia   S/P TAVR (transcatheter aortic valve replacement)   26 mm Edwards Sapien 3 via the TF approach  Severe aortic stenosis   Venous insufficiency   Past Surgical History: Past Surgical History: Procedure Laterality Date  ABDOMINAL HYSTERECTOMY    ANTERIOR CERVICAL DISCECTOMY    APPENDECTOMY    CARPAL TUNNEL RELEASE  12/2011  right arm  CATARACT EXTRACTION    CORONARY ATHERECTOMY  07/23/2018  PTCA/orbital atherectomy/DES x 1 proximal to mid LAD  CORONARY ATHERECTOMY N/A 07/23/2018  Procedure: CORONARY ATHERECTOMY;  Surgeon: Burnell Blanks, MD;  Location: [place] [facility];  Service: Cardiovascular;  Laterality: N/A;  CORONARY STENT INTERVENTION    CORONARY STENT INTERVENTION N/A 07/23/2018  Procedure: CORONARY STENT INTERVENTION;  Surgeon: Burnell Blanks, MD;  Location: [place] [facility];  Service: Cardiovascular;  Laterality: N/A;  EYE SURGERY    INTRAMEDULLARY (IM) NAIL INTERTROCHANTERIC Right 09/19/2019  Procedure: INTRAMEDULLARY (IM) NAIL INTERTROCHANTRIC;  Surgeon: Renette Butters, MD;  Location: [place];  Service: Orthopedics;  Laterality: Right;  LUMBAR LAMINECTOMY    right knee arthroscopy    right shoulder replacement  04/2009  right shoulder surgery  2008  Dr. Noemi Chapel  RIGHT/LEFT HEART CATH AND CORONARY ANGIOGRAPHY N/A 07/07/2018  Procedure: RIGHT/LEFT HEART CATH AND CORONARY ANGIOGRAPHY;  Surgeon: Burnell Blanks, MD;  Location: [place] [facility];  Service: Cardiovascular;  Laterality: N/A;  TEE WITHOUT CARDIOVERSION N/A 03/29/2019  Procedure: TRANSESOPHAGEAL ECHOCARDIOGRAM (TEE);  Surgeon: Burnell Blanks, MD;  Location: [place];  Service: Open Heart Surgery;  Laterality: N/A;  TRANSCATHETER AORTIC VALVE REPLACEMENT, TRANSFEMORAL N/A 03/29/2019  Procedure: TRANSCATHETER AORTIC VALVE REPLACEMENT, TRANSFEMORAL;  Surgeon: Burnell Blanks, MD;  Location: [place];  Service: Open Heart Surgery;  Laterality: N/A;  ULTRASOUND GUIDANCE FOR VASCULAR ACCESS  07/07/2018  Procedure: Ultrasound Guidance For Vascular Access;  Surgeon: Burnell Blanks, MD;  Location: [place];  Service: Cardiovascular;; HPI:  87 year old female with past medical history of COPD, chronic respiratory failure, dysphagia, diastolic congestive heart failure (Echo 03/2020 EF 70-75%), generalized anxiety disorder, severe aortic regurgitation status post TAVR and coronary artery disease (cardiac cath 06/2018  with arthrectomy and DES to LAD) who presents to [facility] emergency department via EMS due to progressively worsening shortness of breath weakness and confusion. Per  chart coughing when eating recently. MBS 12/16/18: moderate pharyngeal dysphagia marked by delayed initiation of swallow at pyriforms/vallecula, inconsistent clearance of penetration/aspiration, and suspicion of esophageal dysfunction; sensed aspiration unable to clear, Dys 2/nectar recommended.  No data recorded Assessment / Plan / Recommendation CHL IP CLINICAL IMPRESSIONS 06/26/2021 Clinical Impression Pt presents with severe oropharyngeal dysphagia characterized by impaired bolus propulsion, reduced bolus cohesion, reduced lingual retraction, a pharyngeal delay, and reduced anterior laryngeal movement. She demonstrated premature spillage to the valleculae and pyriform sinuses, vallecular residue, pyriform sinus residue, and the swallow was often triggered with >50% of the bolus at the level of pyriform sinuses. Hardware from ACDF noted and facilitated anterior protrusion of the posterior pharyngeal wall with some mild residue. Aspiration (PAS 7) was noted during deglutition with honey thick and nectar thick liquids via cup, straw, or full tsp. Laryngeal invasion was improved to penetration (PAS 3,5) with 1/2 tsp boluses of honey thick liquids and with cued use of a chin tuck posture; however, pt demonstrated difficulty consistently using a chin tuck posture. Pt often sensed instances of penetration and her independent use of coughing was effective in expelling some of the penetrate prior to deglutition. Pt's cough was ineffective in mobilizing aspirate. Pt's swallow  function is notably worse than during the last study on 12/16/18. Pt's risk of aspiration is judged to be high before, during, and after deglutition. Pt's aspiration risk has been thoroughly discussed with the pt's daughter by this SLP and various providers. Pt's current diet will be continued with known aspiration risk and with strict observance of swallowing precautions to attempt to reduce risk. SLP will continue to follow pt. SLP Visit Diagnosis Dysphagia, unspecified (R13.10) Attention and concentration deficit following -- Frontal lobe and executive function deficit following -- Impact on safety and function Severe aspiration risk;Risk for inadequate nutrition/hydration   CHL IP TREATMENT RECOMMENDATION 06/26/2021 Treatment Recommendations Therapy as outlined in treatment plan below   Prognosis 06/26/2021 Prognosis for Safe Diet Advancement Guarded Barriers to Reach Goals Cognitive deficits;Severity of deficits;Time post onset Barriers/Prognosis Comment -- CHL IP DIET RECOMMENDATION 06/26/2021 SLP Diet Recommendations Dysphagia 1 (Puree) solids;Honey thick liquids Liquid Administration via Spoon;No straw Medication Administration Crushed with puree Compensations Slow rate;Small sips/bites;Follow solids with liquid;Minimize environmental distractions Postural Changes Seated upright at 90 degrees   CHL IP OTHER RECOMMENDATIONS 06/26/2021 Recommended Consults -- Oral Care Recommendations Oral care BID;Staff/trained caregiver to provide oral care Other Recommendations Order thickener from pharmacy   CHL IP FOLLOW UP RECOMMENDATIONS 06/26/2021 Follow up Recommendations Skilled Nursing facility   [facility] IP FREQUENCY AND DURATION 06/26/2021 Speech Therapy Frequency (ACUTE ONLY) min 2x/week Treatment Duration 2 weeks      CHL IP ORAL PHASE 06/26/2021 Oral Phase Impaired Oral - Pudding Teaspoon -- Oral - Pudding Cup -- Oral - Honey Teaspoon Decreased bolus cohesion;Premature spillage;Reduced posterior propulsion Oral - Honey Cup  Decreased bolus cohesion;Premature spillage;Reduced posterior propulsion Oral - Nectar Teaspoon Decreased bolus cohesion;Premature spillage;Reduced posterior propulsion Oral - Nectar Cup Decreased velopharyngeal closure Oral - Nectar Straw -- Oral - Thin Teaspoon -- Oral - Thin Cup -- Oral - Thin Straw -- Oral - Puree Decreased bolus cohesion;Premature spillage;Reduced posterior propulsion Oral - Mech Soft -- Oral - Regular -- Oral - Multi-Consistency -- Oral - Pill -- Oral Phase - Comment --  CHL IP PHARYNGEAL PHASE 06/26/2021 Pharyngeal Phase Impaired Pharyngeal- Pudding Teaspoon -- Pharyngeal -- Pharyngeal- Pudding Cup -- Pharyngeal -- Pharyngeal- Honey Teaspoon -- Pharyngeal -- Pharyngeal- Honey Cup Pharyngeal residue - valleculae;Pharyngeal residue -  pyriform;Moderate aspiration;Significant aspiration (Amount);Penetration/Aspiration during swallow;Penetration/Apiration after swallow;Penetration/Aspiration before swallow;Delayed swallow initiation-pyriform sinuses;Reduced anterior laryngeal mobility;Reduced tongue base retraction Pharyngeal Material enters airway, passes BELOW cords and not ejected out despite cough attempt by patient Pharyngeal- Nectar Teaspoon Pharyngeal residue - valleculae;Pharyngeal residue - pyriform;Moderate aspiration;Significant aspiration (Amount);Penetration/Aspiration during swallow;Penetration/Apiration after swallow;Penetration/Aspiration before swallow;Delayed swallow initiation-pyriform sinuses;Reduced tongue base retraction;Reduced anterior laryngeal mobility Pharyngeal Material enters airway, passes BELOW cords and not ejected out despite cough attempt by patient;Material enters airway, CONTACTS cords and not ejected out;Material enters airway, remains ABOVE vocal cords and not ejected out Pharyngeal- Nectar Cup Pharyngeal residue - valleculae;Pharyngeal residue - pyriform;Moderate aspiration;Significant aspiration (Amount);Penetration/Aspiration during  swallow;Penetration/Apiration after swallow;Penetration/Aspiration before swallow;Delayed swallow initiation-pyriform sinuses;Reduced anterior laryngeal mobility;Reduced tongue base retraction Pharyngeal Material enters airway, passes BELOW cords and not ejected out despite cough attempt by patient Pharyngeal- Nectar Straw -- Pharyngeal -- Pharyngeal- Thin Teaspoon -- Pharyngeal -- Pharyngeal- Thin Cup -- Pharyngeal -- Pharyngeal- Thin Straw -- Pharyngeal -- Pharyngeal- Puree Pharyngeal residue - valleculae;Pharyngeal residue - pyriform;Delayed swallow initiation-pyriform sinuses;Reduced tongue base retraction;Reduced anterior laryngeal mobility Pharyngeal -- Pharyngeal- Mechanical Soft -- Pharyngeal -- Pharyngeal- Regular -- Pharyngeal -- Pharyngeal- Multi-consistency -- Pharyngeal -- Pharyngeal- Pill -- Pharyngeal -- Pharyngeal Comment --  CHL IP CERVICAL ESOPHAGEAL PHASE 12/16/2018 Cervical Esophageal Phase Impaired Pudding Teaspoon -- Pudding Cup -- Honey Teaspoon -- Honey Cup -- Nectar Teaspoon -- Nectar Cup -- Nectar Straw -- Thin Teaspoon -- Thin Cup -- Thin Straw -- Puree -- Mechanical Soft -- Regular -- Multi-consistency -- Pill -- Cervical Esophageal Comment -- Shanika I. Hardin Negus, [place], [place] Office number [phone] Pager [place] 06/26/2021, 3:59 PM                   Scheduled Meds:  albuterol  2 puff Inhalation BID   vitamin C  500 mg Oral Daily   aspirin EC  81 mg Oral Daily   dexamethasone (DECADRON) injection  6 mg Intravenous Q24H   enoxaparin (LOVENOX) injection  30 mg Subcutaneous Q24H   escitalopram  20 mg Oral Daily   fluticasone  2 spray Each Nare Daily   fluticasone furoate-vilanterol  1 puff Inhalation Daily   potassium chloride  40 mEq Oral Daily   zinc sulfate  220 mg Oral Daily   Continuous Infusions:     LOS: 7 days    Time spent: 24 minutes  Darliss Cheney, MD [facility]

## 2021-06-28 NOTE — TOC Progression Note (Signed)
Transition of Care [facility]) - Progression Note    Patient Details  Name: Jessica [name] MRN: [id number] Date of Birth: 24-Jun-1926  Transition of Care [facility]) CM/SW Contact  Joanne Chars, LCSW Phone Number: 06/28/2021, 10:39 AM  Clinical Narrative:    CSW spoke with Claiborne Billings at [facility] can potentially offer bed Monday once pt is out of quarantine. CSW LM with Kitty at [facility] asking if they can offer once out of quarantine.      Expected Discharge Plan: Skilled Nursing Facility Barriers to Discharge: Other (must enter comment), SNF Pending bed offer (covid positive, in isolation)  Expected Discharge Plan and Services Expected Discharge Plan: [place] Choice: [place] arrangements for the past 2 months: Single Family Home                                       Social Determinants of Health (SDOH) Interventions    Readmission Risk Interventions Readmission Risk Prevention Plan 04/01/2019  Post Dischage Appt Complete  Medication Screening Complete  Transportation Screening Complete  Some recent data might be hidden

## 2021-06-28 NOTE — Progress Notes (Signed)
Occupational Therapy Treatment Patient Details Name: Jessica [name] MRN: [id number] DOB: 07/23/1926 Today's Date: 06/28/2021    History of present illness 87 yo admitted 8/26 with SOB and lethargy Covid (+) with right lung infiltrates. PMhx: COPD on 3L at home, CHF, TAVR, anxiety, CAD, chronic dysphagia, RT hip IM nail   OT comments  Patient met lying supine in bed in apparent distress. Nasal cannula misaligned, SpO2 74%, RR in 30's and HR 90-100's. Nasal cannula adjusted. Noted gurgling with patient unable to recall how to use suction. Hand over hand assist for suction of secretions (noted very weak cough). With suction and cues for pursed lip breathing SpO2 improved to >93%, RR decreased to low 20's and HR returned to 70-80's. Attempted to assist patient with lunch meal but coughing ensued after each small spoon of nectar thick liquids. Meal terminated. RN made aware of events. Patient perseverating on calling her daughter. Phone call made at end of session but patient with great difficulty hearing. This Probation officer able to tell patient what daughter was saying on phone including her plans to visit patient later this date. OT will continue to follow acutely.     Follow Up Recommendations  Supervision/Assistance - 24 hour;SNF;Home health OT    Equipment Recommendations  Hospital bed    Recommendations for Other Services      Precautions / Restrictions Precautions Precautions: Fall Precaution Comments: HOH, incontinent, covid Restrictions Weight Bearing Restrictions: No       Mobility Bed Mobility Overal bed mobility: Needs Assistance             General bed mobility comments: Deferred    Transfers Overall transfer level: Needs assistance               General transfer comment: Deferred    Balance                                           ADL either performed or assessed with clinical judgement   ADL Overall ADL's : Needs  assistance/impaired Eating/Feeding: Maximal assistance Eating/Feeding Details (indicate cue type and reason): Max A to take spoons of nectar thick liquid. Coughing and gurgling immediately after. Meal time terminated and RN notified.                                         Vision       Perception     Praxis      Cognition Arousal/Alertness: Awake/alert Behavior During Therapy: WFL for tasks assessed/performed Overall Cognitive Status: No family/caregiver present to determine baseline cognitive functioning                                 General Comments: HOH, fpllows 1-step verbal commands with increased cues        Exercises     Shoulder Instructions       General Comments Patient with misaligned [place] upon entry with SpO2 74%, HR in 90's-100's and RR 25-34. Nasal cannula adjusted. Patient with gurgling sounds. Required hand over hand assist for use of suction to clear secretions. SpO2 returned to 92-94%. RR 22 with cues for pursed lip breathing. HR returned to 70's.    Pertinent Vitals/ Pain  Pain Assessment: Faces Faces Pain Scale: Hurts a little bit Pain Location: chest with coughing Pain Descriptors / Indicators: Sore Pain Intervention(s): Monitored during session  Home Living                                          Prior Functioning/Environment              Frequency  Min 2X/week        Progress Toward Goals  OT Goals(current goals can now be found in the care plan section)  Progress towards OT goals: Not progressing toward goals - comment (Refer above for session limitations)  Acute Rehab OT Goals Patient Stated Goal: get better OT Goal Formulation: With patient/family Time For Goal Achievement: 07/09/21 Potential to Achieve Goals: Good ADL Goals Pt Will Perform Grooming: with supervision;sitting Pt Will Perform Upper Body Dressing: with set-up;sitting Pt Will Perform Lower Body Dressing:  with min assist;sit to/from stand Pt Will Transfer to Toilet: with min assist;ambulating;bedside commode Pt Will Perform Toileting - Clothing Manipulation and hygiene: with min assist;sit to/from stand Additional ADL Goal #1: Patient will tolerate 15 minute of therapeutic activity indicating increased activity tolerance in prep for ADLs.  Plan Discharge plan remains appropriate;Frequency remains appropriate    Co-evaluation                 AM-PAC OT "6 Clicks" Daily Activity     Outcome Measure   Help from another person eating meals?: A Lot Help from another person taking care of personal grooming?: A Little Help from another person toileting, which includes using toliet, bedpan, or urinal?: Total Help from another person bathing (including washing, rinsing, drying)?: A Lot Help from another person to put on and taking off regular upper body clothing?: A Lot Help from another person to put on and taking off regular lower body clothing?: Total 6 Click Score: 11    End of Session Equipment Utilized During Treatment: Oxygen (3L)  OT Visit Diagnosis: Unsteadiness on feet (R26.81);Muscle weakness (generalized) (M62.81)   Activity Tolerance Other (comment) (Limited by fatigue, SOB, increased RR, decreased saturations and increased HR.)   Patient Left in bed;with call bell/phone within reach;with bed alarm set   Nurse Communication Other (comment) (Vitals and inability to manage suction.)        Time: 0272-5366 OT Time Calculation (min): 36 min  Charges: OT General Charges $OT Visit: 1 Visit OT Treatments $Self Care/Home Management : 23-37 mins  Jessica [name] H. OTR/L Supplemental OT, Department of rehab services [phone]   Jessica [name] R H. 06/28/2021, 12:48 PM

## 2021-06-28 NOTE — Progress Notes (Signed)
Nutrition Follow-up  DOCUMENTATION CODES:   Underweight  INTERVENTION:  -Continue Magic cup TID with meals, each supplement provides 290 kcal and 9 grams of protein -MVI with minerals daily  NUTRITION DIAGNOSIS:   Increased nutrient needs related to acute illness as evidenced by estimated needs.  ongoing  GOAL:   Patient will meet greater than or equal to 90% of their needs  progressing  MONITOR:   PO intake, Supplement acceptance, Weight trends, Labs, I & O's  REASON FOR ASSESSMENT:   Consult Assessment of nutrition requirement/status  ASSESSMENT:   Patient with PMH significant for COPD, chronic respiratory failure on 3L home oxygen, CHF, severe aortic regurgitation s/p TAVR, and CAD. Presents this admission with COVID PNA and aspiration PNA.  Pt is medically stable to discharge per MD but is unable to discharge until 9/6 due to testing positive for COVID on 8/26.   PMT evaluated pt on 8/31 and noted that pt would not want artificial feeding now or in the future. Pt on dysphagia 1 diet with honey thick liquids. SLP following.   Pt's weight was updated on 8/29, note 14.1% increase in weight over 4 months, though pt is still underweight as evidenced by BMI of 17.97.   PO intake: 25-100% x 2 recorded meals  Medications: vitamin c, decadron, klor-con, zinc sulfate Labs: Na 134 (L)  Diet Order:   Diet Order             DIET - DYS 1 Room service appropriate? Yes; Fluid consistency: Honey Thick  Diet effective now                   EDUCATION NEEDS:   Not appropriate for education at this time  Skin:  Skin Assessment: Reviewed RN Assessment  Last BM:  8/31  Height:   Ht Readings from Last 1 Encounters:  05/10/21 '5\' 4"'$  (1.626 m)    Weight:  Wt Readings from Last 3 Encounters:  06/24/21 47.5 kg  04/08/21 44.5 kg  02/03/21 40.8 kg    BMI:  Body mass index is 17.97 kg/m.  Estimated Nutritional Needs:   Kcal:  1400-1600 kcal  Protein:  70-85  grams  Fluid:  >/= 1.4 L/day    Larkin Ina, MS, RD, LDN (she/her/hers) RD pager number and weekend/on-call pager number located in [place].

## 2021-06-29 NOTE — Progress Notes (Signed)
PROGRESS NOTE    Jessica [name] [facility]  [id number] DOB: 1925/11/11 DOA: 06/21/2021 PCP: [name], [name], MD    Brief Narrative:  87 year old with chronic debility, COPD and chronic hypoxemic respiratory failure on 3 L oxygen at home, chronic aspiration with dysphagia, diastolic heart failure, generalized anxiety disorder, severe aortic regurgitation and status post TAVR and coronary artery disease brought to the emergency room with progressive worsening shortness of breath, weakness and confusion.  Patient lives with her daughter who developed COVID-19 infection about 9 days ago and improved.  Patient started symptoms about 2 to 3 days ago with increasing lethargic.  In the emergency room, COVID-19 positive.  Chest x-ray with bilateral pneumonia right more than left.  Temperature 104.9.  Lactic acid 2.5.  Resuscitated with IV fluids.  Started on antibiotics and antiviral therapies and admitted to the hospital.   Assessment & Plan:   Principal Problem:   Acute on chronic respiratory failure with hypoxia (HCC) Active Problems:   Mixed hyperlipidemia   Coronary artery disease involving native coronary artery of native heart without angina pectoris   Chronic diastolic CHF (congestive heart failure) ([place])   COVID-19 virus infection   Aspiration pneumonia of right lung due to gastric secretions (HCC)   Acute metabolic encephalopathy   Lactic acidosis   Sepsis with organ dysfunction ([place])   Dysphagia  Acute on chronic hypoxemic respiratory failure uses 3 L oxygen at home.Pneumonia due to COVID-19 virus infection: Sepsis present on admission with organ dysfunction secondary to aspiration pneumonia.  Patient completed 5 days remdesivir on 06/25/2021, completed Rocephin and Zithromax for aspiration pneumonia on 06/25/2021, continues to be on dexamethasone for total of 10 days ending on 07/02/2021. Continue chest physiotherapy, incentive spirometry, deep breathing exercises, sputum induction, mucolytic's  and bronchodilators. Inflammatory markers improving.  Patient very comfortable currently on 2 L of oxygen which is her baseline.   Acute metabolic encephalopathy: Due to above.  Currently resolved.  Chronic diastolic heart failure: Well compensated.  Dehydrated now.   Coronary artery disease: No evidence of ischemia.  Currently on a statin and aspirin.  Hypomagnesemia: Replaced.  Magnesium level pending today.  Goal of care: Previous hospitalist had a long discussion with the daughter and after discussion and having palliative care on board, CODE STATUS was changed to DNR.  They decided to let her eat honey thick liquids and pured diet with continuous close monitoring. Palliative care on constant communications with family.  I personally spoke to the daughter and she told me that she would like to pursue rehab/SNF for the patient.  DVT prophylaxis: enoxaparin (LOVENOX) injection 30 mg Start: 06/22/21 1000   Code Status: DNR/DNI Family Communication: None at bedside.  Discussed with daughter yesterday on the phone. Disposition Plan: Status is: Inpatient  Remains inpatient appropriate because: Unsafe DC plan  Dispo: The patient is from: Home              Anticipated d/c is to: SNF, per TOC note, she can potentially get a bed offer on Monday for discharge.              Patient currently is medically stable to d/c.   Difficult to place patient No         Consultants:  None  Procedures:  None  Antimicrobials:  Rocephin, azithromycin, remdesivir 8/26----   Subjective: Seen and examined.  No complaints.  Alert and oriented and very hard of hearing.  Objective: Vitals:   06/28/21 2100 06/29/21 0443 06/29/21 0817 06/29/21 1022  BP: (!) 118/52 132/74 (!) 145/50 133/60  Pulse:  63 62 67  Resp: 20  20 (!) 26  Temp: 98.2 F (36.8 C) 98.3 F (36.8 C) 98.4 F (36.9 C) (!) 97.1 F (36.2 C)  TempSrc:  Oral Axillary Oral  SpO2:   100% 97%  Weight:       No intake or  output data in the 24 hours ending 06/29/21 1115  Filed Weights   06/24/21 1706  Weight: 47.5 kg    Examination:  General exam: Appears calm and comfortable  Respiratory system: Clear to auscultation. Respiratory effort normal. Cardiovascular system: S1 & S2 heard, RRR. No JVD, murmurs, rubs, gallops or clicks. No pedal edema. Gastrointestinal system: Abdomen is nondistended, soft and nontender. No organomegaly or masses felt. Normal bowel sounds heard. Central nervous system: Alert and oriented. No focal neurological deficits. Extremities: Symmetric 5 x 5 power.   Data Reviewed: I have personally reviewed following labs and imaging studies  CBC: Recent Labs  Lab 06/23/21 0311 06/24/21 0241 06/25/21 0239 06/26/21 0327  WBC 8.4 3.9* 3.9* 3.2*  NEUTROABS 7.1 3.1 2.9 2.3  HGB 9.7* 8.9* 9.3* 9.2*  HCT 30.6* 28.9* 28.8* 27.6*  MCV 97.5 99.0 95.7 92.6  PLT 324 340 357 [id number]    Basic Metabolic Panel: Recent Labs  Lab 06/23/21 0311 06/24/21 0241 06/25/21 0239 06/26/21 0327 06/27/21 0958  NA 140 140 139 133* 134*  K 3.3* 3.9 4.3 4.5 3.7  CL 106 109 106 101 99  CO2 '26 26 27 27 27  '$ GLUCOSE 104* 114* 69* 91 95  BUN 31* 28* '21 19 16  '$ CREATININE 0.64 0.61 0.57 0.56 0.60  CALCIUM 8.9 9.0 9.0 8.8* 8.8*  MG 1.9 1.8 1.7 1.6* 1.9    GFR: Estimated Creatinine Clearance: 32.9 mL/min (by C-G formula based on SCr of 0.6 mg/dL). Liver Function Tests: Recent Labs  Lab 06/23/21 0311 06/24/21 0241 06/25/21 0239 06/26/21 0327  AST '26 20 31 '$ 37  ALT '16 16 20 28  '$ ALKPHOS 93 80 78 74  BILITOT 0.4 0.6 0.3 0.6  PROT 6.3* 5.9* 5.9* 5.5*  ALBUMIN 2.3* 2.2* 2.4* 2.2*    No results for input(s): LIPASE, AMYLASE in the last 168 hours. No results for input(s): AMMONIA in the last 168 hours. Coagulation Profile: No results for input(s): INR, PROTIME in the last 168 hours.  Cardiac Enzymes: No results for input(s): CKTOTAL, CKMB, CKMBINDEX, TROPONINI in the last 168 hours. BNP (last  3 results) No results for input(s): PROBNP in the last 8760 hours. HbA1C: No results for input(s): HGBA1C in the last 72 hours. CBG: No results for input(s): GLUCAP in the last 168 hours. Lipid Profile: No results for input(s): CHOL, HDL, LDLCALC, TRIG, CHOLHDL, LDLDIRECT in the last 72 hours. Thyroid Function Tests: No results for input(s): TSH, T4TOTAL, FREET4, T3FREE, THYROIDAB in the last 72 hours. Anemia Panel: No results for input(s): VITAMINB12, FOLATE, FERRITIN, TIBC, IRON, RETICCTPCT in the last 72 hours. Sepsis Labs: No results for input(s): PROCALCITON, LATICACIDVEN in the last 168 hours.   Recent Results (from the past 240 hour(s))  Blood Culture (routine x 2)     Status: None   Collection Time: 06/21/21  4:30 PM   Specimen: BLOOD  Result Value Ref Range Status   Specimen Description BLOOD BLOOD RIGHT FOREARM  Final   Special Requests   Final    BOTTLES DRAWN AEROBIC AND ANAEROBIC Blood Culture adequate volume   Culture   Final    NO  GROWTH 5 DAYS Performed at [facility], [place] [address], [place], [place] [address]    Report Status 06/26/2021 FINAL  Final  Blood Culture (routine x 2)     Status: None   Collection Time: 06/21/21  4:35 PM   Specimen: BLOOD  Result Value Ref Range Status   Specimen Description BLOOD RIGHT ANTECUBITAL  Final   Special Requests   Final    BOTTLES DRAWN AEROBIC AND ANAEROBIC Blood Culture results may not be optimal due to an excessive volume of blood received in culture bottles   Culture   Final    NO GROWTH 5 DAYS Performed at [facility], [place] [address], [place], [place] [address]    Report Status 06/26/2021 FINAL  Final  Resp Panel by RT-PCR (Flu A&B, Covid) Nasopharyngeal Swab     Status: Abnormal   Collection Time: 06/21/21  4:40 PM   Specimen: Nasopharyngeal Swab; Nasopharyngeal(NP) swabs in vial transport medium  Result Value Ref Range Status   SARS Coronavirus 2 by RT PCR POSITIVE (A) NEGATIVE Final     Comment: RESULT CALLED TO, READ BACK BY AND VERIFIED WITH: C,MAS '@1826'$  06/21/21 EB (NOTE) SARS-CoV-2 target nucleic acids are DETECTED.  The SARS-CoV-2 RNA is generally detectable in upper respiratory specimens during the acute phase of infection. Positive results are indicative of the presence of the identified virus, but do not rule out bacterial infection or co-infection with other pathogens not detected by the test. Clinical correlation with patient history and other diagnostic information is necessary to determine patient infection status. The expected result is Negative.  Fact Sheet for Patients: EntrepreneurPulse.com.au  Fact Sheet for Healthcare Providers: IncredibleEmployment.be  This test is not yet approved or cleared by the [place] FDA and  has been authorized for detection and/or diagnosis of SARS-CoV-2 by FDA under an Emergency Use Authorization (EUA).  This EUA will remain in effect (meaning this test can be used) for  the duration of  the COVID-19 declaration under Section 564(b)(1) of the Act, 21 U.S.C. section 360bbb-3(b)(1), unless the authorization is terminated or revoked sooner.     Influenza A by PCR NEGATIVE NEGATIVE Final   Influenza B by PCR NEGATIVE NEGATIVE Final    Comment: (NOTE) The Xpert Xpress SARS-CoV-2/FLU/RSV plus assay is intended as an aid in the diagnosis of influenza from Nasopharyngeal swab specimens and should not be used as a sole basis for treatment. Nasal washings and aspirates are unacceptable for Xpert Xpress SARS-CoV-2/FLU/RSV testing.  Fact Sheet for Patients: EntrepreneurPulse.com.au  Fact Sheet for Healthcare Providers: IncredibleEmployment.be  This test is not yet approved or cleared by the [place] FDA and has been authorized for detection and/or diagnosis of SARS-CoV-2 by FDA under an Emergency Use Authorization (EUA). This EUA will  remain in effect (meaning this test can be used) for the duration of the COVID-19 declaration under Section 564(b)(1) of the Act, 21 U.S.C. section 360bbb-3(b)(1), unless the authorization is terminated or revoked.  Performed at [facility], [place] [address] [place], [place] [address]   Urine Culture     Status: None   Collection Time: 06/22/21  1:30 AM   Specimen: In/Out Cath Urine  Result Value Ref Range Status   Specimen Description IN/OUT CATH URINE  Final   Special Requests NONE  Final   Culture   Final    NO GROWTH Performed at [facility], [place] [address], [place], [place] [address]    Report Status  06/23/2021 FINAL  Final      Radiology Studies: No results found.   Scheduled Meds:  vitamin C  500 mg Oral Daily   aspirin EC  81 mg Oral Daily   dexamethasone (DECADRON) injection  6 mg Intravenous Q24H   enoxaparin (LOVENOX) injection  30 mg Subcutaneous Q24H   escitalopram  20 mg Oral Daily   fluticasone  2 spray Each Nare Daily   fluticasone furoate-vilanterol  1 puff Inhalation Daily   multivitamin with minerals  1 tablet Oral Daily   potassium chloride  40 mEq Oral Daily   zinc sulfate  220 mg Oral Daily   Continuous Infusions:    LOS: 8 days   Time spent: 24 minutes  Darliss Cheney, MD [facility]

## 2021-06-30 NOTE — Progress Notes (Signed)
PROGRESS NOTE    Jessica [name] [facility]  [id number] DOB: 1926/03/18 DOA: 06/21/2021 PCP: [name], [name], MD    Brief Narrative:  87 year old with chronic debility, COPD and chronic hypoxemic respiratory failure on 3 L oxygen at home, chronic aspiration with dysphagia, diastolic heart failure, generalized anxiety disorder, severe aortic regurgitation and status post TAVR and coronary artery disease brought to the emergency room with progressive worsening shortness of breath, weakness and confusion.  Patient lives with her daughter who developed COVID-19 infection about 9 days ago and improved.  Patient started symptoms about 2 to 3 days ago with increasing lethargic.  In the emergency room, COVID-19 positive.  Chest x-ray with bilateral pneumonia right more than left.  Temperature 104.9.  Lactic acid 2.5.  Resuscitated with IV fluids.  Started on antibiotics and antiviral therapies and admitted to the hospital.   Assessment & Plan:   Principal Problem:   Acute on chronic respiratory failure with hypoxia (HCC) Active Problems:   Mixed hyperlipidemia   Coronary artery disease involving native coronary artery of native heart without angina pectoris   Chronic diastolic CHF (congestive heart failure) ([place])   COVID-19 virus infection   Aspiration pneumonia of right lung due to gastric secretions (HCC)   Acute metabolic encephalopathy   Lactic acidosis   Sepsis with organ dysfunction ([place])   Dysphagia  Acute on chronic hypoxemic respiratory failure uses 3 L oxygen at home.Pneumonia due to COVID-19 virus infection: Sepsis present on admission with organ dysfunction secondary to aspiration pneumonia.  Patient completed 5 days remdesivir on 06/25/2021, completed Rocephin and Zithromax for aspiration pneumonia on 06/25/2021, continues to be on dexamethasone for total of 10 days ending on 07/02/2021. Continue chest physiotherapy, incentive spirometry, deep breathing exercises, sputum induction, mucolytic's  and bronchodilators. Inflammatory markers improving.  Patient very comfortable currently on 2 L of oxygen which is her baseline.   Acute metabolic encephalopathy: Due to above.  Currently resolved.  Chronic diastolic heart failure: Well compensated.  Dehydrated now.   Coronary artery disease: No evidence of ischemia.  Currently on a statin and aspirin.  Hypomagnesemia: Replaced.  Magnesium level pending today.  Goal of care: Previous hospitalist had a long discussion with the daughter and after discussion and having palliative care on board, CODE STATUS was changed to DNR.  They decided to let her eat honey thick liquids and pured diet with continuous close monitoring. Palliative care on constant communications with family.  I personally spoke to the daughter and she told me that she would like to pursue rehab/SNF for the patient.  DVT prophylaxis: enoxaparin (LOVENOX) injection 30 mg Start: 06/22/21 1000   Code Status: DNR/DNI Family Communication: None at bedside.   Disposition Plan: Status is: Inpatient  Remains inpatient appropriate because: Unsafe DC plan  Dispo: The patient is from: Home              Anticipated d/c is to: SNF, per TOC note, she can potentially get a bed offer on Monday for discharge.              Patient currently is medically stable to d/c.   Difficult to place patient No         Consultants:  None  Procedures:  None  Antimicrobials:  Rocephin, azithromycin, remdesivir 8/26----   Subjective: Patient seen and examined.  Alert and oriented, very hard of hearing.  Comfortable. Objective: Vitals:   06/29/21 1952 06/30/21 0021 06/30/21 0346 06/30/21 0800  BP: (!) 145/56 (!) 146/63 (!) 141/65 Marland Kitchen)  149/72  Pulse:  (!) 50 (!) 53 65  Resp: '15  14 20  '$ Temp: 98.5 F (36.9 C) 98 F (36.7 C) 97.8 F (36.6 C)   TempSrc: Oral Axillary Axillary   SpO2:    95%  Weight:        Intake/Output Summary (Last 24 hours) at 06/30/2021 1041 Last data filed  at 06/30/2021 0349 Gross per 24 hour  Intake --  Output 350 ml  Net -350 ml    Filed Weights   06/24/21 1706  Weight: 47.5 kg    Examination:  General exam: Appears calm and comfortable  Respiratory system: Clear to auscultation. Respiratory effort normal. Cardiovascular system: S1 & S2 heard, RRR. No JVD, murmurs, rubs, gallops or clicks. No pedal edema. Gastrointestinal system: Abdomen is nondistended, soft and nontender. No organomegaly or masses felt. Normal bowel sounds heard. Central nervous system: Alert and oriented. No focal neurological deficits. Extremities: Symmetric 5 x 5 power. Skin: No rashes, lesions or ulcers.    Data Reviewed: I have personally reviewed following labs and imaging studies  CBC: Recent Labs  Lab 06/24/21 0241 06/25/21 0239 06/26/21 0327  WBC 3.9* 3.9* 3.2*  NEUTROABS 3.1 2.9 2.3  HGB 8.9* 9.3* 9.2*  HCT 28.9* 28.8* 27.6*  MCV 99.0 95.7 92.6  PLT 340 357 [id number]    Basic Metabolic Panel: Recent Labs  Lab 06/24/21 0241 06/25/21 0239 06/26/21 0327 06/27/21 0958  NA 140 139 133* 134*  K 3.9 4.3 4.5 3.7  CL 109 106 101 99  CO2 '26 27 27 27  '$ GLUCOSE 114* 69* 91 95  BUN 28* '21 19 16  '$ CREATININE 0.61 0.57 0.56 0.60  CALCIUM 9.0 9.0 8.8* 8.8*  MG 1.8 1.7 1.6* 1.9    GFR: Estimated Creatinine Clearance: 32.9 mL/min (by C-G formula based on SCr of 0.6 mg/dL). Liver Function Tests: Recent Labs  Lab 06/24/21 0241 06/25/21 0239 06/26/21 0327  AST 20 31 37  ALT '16 20 28  '$ ALKPHOS 80 78 74  BILITOT 0.6 0.3 0.6  PROT 5.9* 5.9* 5.5*  ALBUMIN 2.2* 2.4* 2.2*    No results for input(s): LIPASE, AMYLASE in the last 168 hours. No results for input(s): AMMONIA in the last 168 hours. Coagulation Profile: No results for input(s): INR, PROTIME in the last 168 hours.  Cardiac Enzymes: No results for input(s): CKTOTAL, CKMB, CKMBINDEX, TROPONINI in the last 168 hours. BNP (last 3 results) No results for input(s): PROBNP in the last 8760  hours. HbA1C: No results for input(s): HGBA1C in the last 72 hours. CBG: No results for input(s): GLUCAP in the last 168 hours. Lipid Profile: No results for input(s): CHOL, HDL, LDLCALC, TRIG, CHOLHDL, LDLDIRECT in the last 72 hours. Thyroid Function Tests: No results for input(s): TSH, T4TOTAL, FREET4, T3FREE, THYROIDAB in the last 72 hours. Anemia Panel: No results for input(s): VITAMINB12, FOLATE, FERRITIN, TIBC, IRON, RETICCTPCT in the last 72 hours. Sepsis Labs: No results for input(s): PROCALCITON, LATICACIDVEN in the last 168 hours.   Recent Results (from the past 240 hour(s))  Blood Culture (routine x 2)     Status: None   Collection Time: 06/21/21  4:30 PM   Specimen: BLOOD  Result Value Ref Range Status   Specimen Description BLOOD BLOOD RIGHT FOREARM  Final   Special Requests   Final    BOTTLES DRAWN AEROBIC AND ANAEROBIC Blood Culture adequate volume   Culture   Final    NO GROWTH 5 DAYS Performed at [facility],  [address] [place], [place] [address]    Report Status 06/26/2021 FINAL  Final  Blood Culture (routine x 2)     Status: None   Collection Time: 06/21/21  4:35 PM   Specimen: BLOOD  Result Value Ref Range Status   Specimen Description BLOOD RIGHT ANTECUBITAL  Final   Special Requests   Final    BOTTLES DRAWN AEROBIC AND ANAEROBIC Blood Culture results may not be optimal due to an excessive volume of blood received in culture bottles   Culture   Final    NO GROWTH 5 DAYS Performed at [facility], [place] [address] [place], [place] [address]    Report Status 06/26/2021 FINAL  Final  Resp Panel by RT-PCR (Flu A&B, Covid) Nasopharyngeal Swab     Status: Abnormal   Collection Time: 06/21/21  4:40 PM   Specimen: Nasopharyngeal Swab; Nasopharyngeal(NP) swabs in vial transport medium  Result Value Ref Range Status   SARS Coronavirus 2 by RT PCR POSITIVE (A) NEGATIVE Final    Comment: RESULT CALLED TO, READ BACK BY AND VERIFIED WITH: C,MAS  '@1826'$  06/21/21 EB (NOTE) SARS-CoV-2 target nucleic acids are DETECTED.  The SARS-CoV-2 RNA is generally detectable in upper respiratory specimens during the acute phase of infection. Positive results are indicative of the presence of the identified virus, but do not rule out bacterial infection or co-infection with other pathogens not detected by the test. Clinical correlation with patient history and other diagnostic information is necessary to determine patient infection status. The expected result is Negative.  Fact Sheet for Patients: EntrepreneurPulse.com.au  Fact Sheet for Healthcare Providers: IncredibleEmployment.be  This test is not yet approved or cleared by the [place] FDA and  has been authorized for detection and/or diagnosis of SARS-CoV-2 by FDA under an Emergency Use Authorization (EUA).  This EUA will remain in effect (meaning this test can be used) for  the duration of  the COVID-19 declaration under Section 564(b)(1) of the Act, 21 U.S.C. section 360bbb-3(b)(1), unless the authorization is terminated or revoked sooner.     Influenza A by PCR NEGATIVE NEGATIVE Final   Influenza B by PCR NEGATIVE NEGATIVE Final    Comment: (NOTE) The Xpert Xpress SARS-CoV-2/FLU/RSV plus assay is intended as an aid in the diagnosis of influenza from Nasopharyngeal swab specimens and should not be used as a sole basis for treatment. Nasal washings and aspirates are unacceptable for Xpert Xpress SARS-CoV-2/FLU/RSV testing.  Fact Sheet for Patients: EntrepreneurPulse.com.au  Fact Sheet for Healthcare Providers: IncredibleEmployment.be  This test is not yet approved or cleared by the [place] FDA and has been authorized for detection and/or diagnosis of SARS-CoV-2 by FDA under an Emergency Use Authorization (EUA). This EUA will remain in effect (meaning this test can be used) for the duration of  the COVID-19 declaration under Section 564(b)(1) of the Act, 21 U.S.C. section 360bbb-3(b)(1), unless the authorization is terminated or revoked.  Performed at [facility], [place] [address], [place], [place] [address]   Urine Culture     Status: None   Collection Time: 06/22/21  1:30 AM   Specimen: In/Out Cath Urine  Result Value Ref Range Status   Specimen Description IN/OUT CATH URINE  Final   Special Requests NONE  Final   Culture   Final    NO GROWTH Performed at [facility], [place] [address] [place], [place] [address]    Report Status 06/23/2021 FINAL  Final  Radiology Studies: No results found.   Scheduled Meds:  vitamin C  500 mg Oral Daily   aspirin EC  81 mg Oral Daily   dexamethasone (DECADRON) injection  6 mg Intravenous Q24H   enoxaparin (LOVENOX) injection  30 mg Subcutaneous Q24H   escitalopram  20 mg Oral Daily   fluticasone  2 spray Each Nare Daily   fluticasone furoate-vilanterol  1 puff Inhalation Daily   multivitamin with minerals  1 tablet Oral Daily   potassium chloride  40 mEq Oral Daily   zinc sulfate  220 mg Oral Daily   Continuous Infusions:    LOS: 9 days   Time spent: 25 minutes  Darliss Cheney, MD [facility]

## 2021-06-30 NOTE — TOC Progression Note (Addendum)
Transition of Care [facility]) - Progression Note    Patient Details  Name: KINLEA GEHO MRN: [id number] Date of Birth: August 20, 1926  [facility] CM/SW Contact  Ina Homes, [place] Phone Number: 06/30/2021, 11:00 AM  Clinical Narrative:     SW spoke with pts daughter [name] regarding SNF's. [name] still prefers [facility] as that is where pt's family is at and pt has been there in the past. [name]'s second choice would be [facility]. [name] reports she will attempt to contact [facility].  SW left VM with admissions ([facility] [phone]) to review referral   Expected Discharge Plan: Skilled Nursing Facility Barriers to Discharge: Other (must enter comment), SNF Pending bed offer (covid positive, in isolation)  Expected Discharge Plan and Services Expected Discharge Plan: [place] Choice: [place] arrangements for the past 2 months: Single Family Home                                       Social Determinants of Health (SDOH) Interventions    Readmission Risk Interventions Readmission Risk Prevention Plan 04/01/2019  Post Dischage Appt Complete  Medication Screening Complete  Transportation Screening Complete  Some recent data might be hidden

## 2021-07-01 DIAGNOSIS — R627 Adult failure to thrive: Secondary | ICD-10-CM

## 2021-07-01 LAB — CBC WITH DIFFERENTIAL/PLATELET
Abs Immature Granulocytes: 0.03 10*3/uL (ref 0.00–0.07)
Basophils Absolute: 0 10*3/uL (ref 0.0–0.1)
Basophils Relative: 0 %
Eosinophils Absolute: 0 10*3/uL (ref 0.0–0.5)
Eosinophils Relative: 0 %
HCT: 36.3 % (ref 36.0–46.0)
Hemoglobin: 11.6 g/dL — ABNORMAL LOW (ref 12.0–15.0)
Immature Granulocytes: 0 %
Lymphocytes Relative: 18 %
Lymphs Abs: 1.2 10*3/uL (ref 0.7–4.0)
MCH: 30.4 pg (ref 26.0–34.0)
MCHC: 32 g/dL (ref 30.0–36.0)
MCV: 95.3 fL (ref 80.0–100.0)
Monocytes Absolute: 0.5 10*3/uL (ref 0.1–1.0)
Monocytes Relative: 7 %
Neutro Abs: 5.1 10*3/uL (ref 1.7–7.7)
Neutrophils Relative %: 75 %
Platelets: 418 10*3/uL — ABNORMAL HIGH (ref 150–400)
RBC: 3.81 MIL/uL — ABNORMAL LOW (ref 3.87–5.11)
RDW: 14.6 % (ref 11.5–15.5)
WBC: 6.8 10*3/uL (ref 4.0–10.5)
nRBC: 0 % (ref 0.0–0.2)

## 2021-07-01 LAB — BASIC METABOLIC PANEL
Anion gap: 7 (ref 5–15)
BUN: 27 mg/dL — ABNORMAL HIGH (ref 8–23)
CO2: 27 mmol/L (ref 22–32)
Calcium: 9.6 mg/dL (ref 8.9–10.3)
Chloride: 100 mmol/L (ref 98–111)
Creatinine, Ser: 0.66 mg/dL (ref 0.44–1.00)
GFR, Estimated: >60 mL/min (ref >60)
Glucose, Bld: 91 mg/dL (ref 70–99)
Potassium: 4.2 mmol/L (ref 3.5–5.1)
Sodium: 134 mmol/L — ABNORMAL LOW (ref 135–145)

## 2021-07-01 LAB — MAGNESIUM: Magnesium: 1.9 mg/dL (ref 1.7–2.4)

## 2021-07-01 MED ORDER — ALPRAZOLAM 0.5 MG PO TABS: 0.5000 mg | ORAL_TABLET | Freq: Two times a day (BID) | ORAL | 2 refills | Status: DC | PRN

## 2021-07-01 NOTE — Plan of Care (Signed)

## 2021-07-01 NOTE — Progress Notes (Signed)
Patient ID: Jessica [name], female   DOB: 1926-05-21, 87 y.o.   MRN: [id number]    Progress Note from the Palliative Medicine Team at [facility]   Patient Name: Jessica [name] Jessica [name]        Date: 07/01/2021 DOB: 03-Jan-1926  Age: 87 y.o. MRN#: [id number] Attending Physician: Darliss Cheney, MD Primary Care Physician: [name], [name], MD Admit Date: 06/21/2021   Medical records reviewed   87 y.o. female  admitted on 06/21/2021 with significant past medical history of COPD, chronic respiratory failure, dysphagia, diastolic congestive heart failure (Echo 03/2020 EF 70-75%), generalized anxiety disorder, severe aortic regurgitation status post TAVR and coronary artery disease (cardiac cath 06/2018  with arthrectomy and DES to LAD) who presents to [facility] emergency department via EMS due to progressively worsening shortness of breath weakness and confusion.    In  the emergency department patient was found to be positive for COVID-19.  Chest x-ray was additionally performed and found to have right-sided pulmonary infiltrates, worse in the right lower lobe.  Patient was initially found to exhibit multiple SIRS criteria including markedly elevated fevers as high as 104.68F .  Patient was additionally found to exhibit a lactic acidosis of 2.5.  Patient was initiated on isotonic intravenous fluids and given broad-spectrum intravenous antibiotics.    Daughter reports patient has had increasing difficulty with swallowing and a 25 pound weight loss over the last 9 months.  Her diet at home is strictly pured   Due to patient progressively worsening lethargy confusion and weakness EMS was contacted who promptly came to evaluate the patient and brought her into [facility] emergency department for evaluation.    Patient admitted for continued evaluation, treatment and stabilization.     Today is day 9 of this hospitalization.   Secondary to patient's frailty and overall failure to thrive family  continue to face treatment option decisions, advanced directive decisions and anticipatory care needs.   This NP visited patient at the bedside as a follow up for palliative medicine needs and emotional support.  Patient is alert and wants to communicate however it is difficult 2/2 HOH and COVID-19 PPE  I spoke to  daughter on the phone  for continued conversation regarding  current medical situation  Education offered again on patient's high risk for aspiration and likely decompensation secondary to the patient's inability to support her self from a hydration and nutritional standpoint.  She is high risk for pneumonia secondary to aspiration.  Daughter clearly understands the patient's current medical issues, she remains hopeful for improvement however no desire for artificial feeding at his time.  This decision is very pragmatic understanding that comfort, quality and dignity are a priority for this patient at this time in her life.   Plan of Care: -DNR/DNI -No artificial feeding now or in the future, hoping that swallow will improve as strength improves    Family recognizes risk of aspiration  -Family requests rehab on discharge-notified that [facility] has a bed for her tomorrow -recommend OP palliative services on discharge   Education offered on hospice benefit       [place] documented today in [place].  MOST form left in room for daughter to complete at [facility]  Discussed with patient/daughter  the importance of continued conversation with family and the medical providers regarding overall plan of care and treatment options,  ensuring decisions are within the context of the patients values and GOCs.  Questions and concerns addressed  Total time spent on the unit was 35 minutes  Greater than 50% of the time was spent in counseling and coordination of care  Wadie Lessen NP  Palliative Medicine Team Team Phone # [phone] Pager [phone]

## 2021-07-01 NOTE — TOC Progression Note (Signed)
Transition of Care [facility]) - Progression Note    Patient Details  Name: Jessica [name] MRN: [id number] Date of Birth: 1925/11/14  [facility] CM/SW Contact  Joanne Chars, LCSW Phone Number: 07/01/2021, 12:20 PM  Clinical Narrative:   Pt received bed offer from [facility], which is the first choice of the family.  Daughter [name] informed and does want to accept.  [facility] unable to accept for today, will plan on DC to [facility] tomorrow.      Expected Discharge Plan: Skilled Nursing Facility Barriers to Discharge: Other (must enter comment), SNF Pending bed offer (covid positive, in isolation)  Expected Discharge Plan and Services Expected Discharge Plan: [place] Choice: [place] arrangements for the past 2 months: Single Family Home Expected Discharge Date: 07/01/21                                     Social Determinants of Health (SDOH) Interventions    Readmission Risk Interventions Readmission Risk Prevention Plan 04/01/2019  Post Dischage Appt Complete  Medication Screening Complete  Transportation Screening Complete  Some recent data might be hidden

## 2021-07-01 NOTE — Progress Notes (Signed)
   07/01/21 0351  Assess: MEWS Score  Temp 98.9 F (37.2 C)  BP 118/64  Pulse Rate (!) 49  ECG Heart Rate (!) 49  Resp 13  Level of Consciousness Alert  SpO2 100 %  O2 Device HFNC  Assess: MEWS Score  MEWS Temp 0  MEWS Systolic 0  MEWS Pulse 1  MEWS RR 1  MEWS LOC 0  MEWS Score 2  MEWS Score Color Yellow  Assess: if the MEWS score is Yellow or Red  Were vital signs taken at a resting state? Yes  Focused Assessment Change from prior assessment (see assessment flowsheet)  Early Detection of Sepsis Score *See Row Information* Low  MEWS guidelines implemented *See Row Information* Yes  Treat  MEWS Interventions Other (Comment) (runs low when asleep)  Pain Scale 0-10  Pain Score 0  Take Vital Signs  Increase Vital Sign Frequency  Yellow: Q 2hr X 2 then Q 4hr X 2, if remains yellow, continue Q 4hrs  Escalate  MEWS: Escalate Yellow: discuss with charge nurse/RN and consider discussing with provider and RRT  Notify: Charge Nurse/RN  Name of Charge Nurse/RN Notified Estill Bamberg  Date Charge Nurse/RN Notified 07/01/21  Time Charge Nurse/RN Notified 0440  Notify: Provider  Provider Name/Title NA  Patient is resting and asymptomatic, HR runs low when asleep, will continue to monitor

## 2021-07-01 NOTE — Progress Notes (Signed)
PROGRESS NOTE    Lalinda Khamvongsa [facility]  [id number] DOB: 20-Oct-1926 DOA: 06/21/2021 PCP: [name], [name], MD    Brief Narrative:  87 year old with chronic debility, COPD and chronic hypoxemic respiratory failure on 3 L oxygen at home, chronic aspiration with dysphagia, diastolic heart failure, generalized anxiety disorder, severe aortic regurgitation and status post TAVR and coronary artery disease brought to the emergency room with progressive worsening shortness of breath, weakness and confusion.  Patient lives with her daughter who developed COVID-19 infection about 9 days ago and improved.  Patient started symptoms about 2 to 3 days ago with increasing lethargic.  In the emergency room, COVID-19 positive.  Chest x-ray with bilateral pneumonia right more than left.  Temperature 104.9.  Lactic acid 2.5.  Resuscitated with IV fluids.  Started on antibiotics and antiviral therapies and admitted to the hospital.   Assessment & Plan:   Principal Problem:   Acute on chronic respiratory failure with hypoxia (HCC) Active Problems:   Mixed hyperlipidemia   Coronary artery disease involving native coronary artery of native heart without angina pectoris   Chronic diastolic CHF (congestive heart failure) ([place])   COVID-19 virus infection   Aspiration pneumonia of right lung due to gastric secretions (HCC)   Acute metabolic encephalopathy   Lactic acidosis   Sepsis with organ dysfunction ([place])   Dysphagia  Acute on chronic hypoxemic respiratory failure uses 3 L oxygen at home.Pneumonia due to COVID-19 virus infection: Sepsis present on admission with organ dysfunction secondary to aspiration pneumonia.  Patient completed 5 days remdesivir on 06/25/2021, completed Rocephin and Zithromax for aspiration pneumonia on 06/25/2021, continues to be on dexamethasone for total of 10 days ending on 07/02/2021. Continue chest physiotherapy, incentive spirometry, deep breathing exercises, sputum induction, mucolytic's  and bronchodilators. Inflammatory markers improving.  Patient very comfortable currently on 2 L of oxygen which is her baseline.   Acute metabolic encephalopathy: Due to above.  Currently resolved.  Chronic diastolic heart failure: Well compensated.  Dehydrated now.   Coronary artery disease: No evidence of ischemia.  Currently on a statin and aspirin.  Hypomagnesemia: Replaced.  Magnesium level pending today.  Goal of care: Previous hospitalist had a long discussion with the daughter and after discussion and having palliative care on board, CODE STATUS was changed to DNR.  They decided to let her eat honey thick liquids and pured diet with continuous close monitoring. Palliative care on constant communications with family.  I personally spoke to the daughter and she told me that she would like to pursue rehab/SNF for the patient.  DVT prophylaxis: enoxaparin (LOVENOX) injection 30 mg Start: 06/22/21 1000   Code Status: DNR/DNI Family Communication: None at bedside.   Disposition Plan: Status is: Inpatient  Remains inpatient appropriate because: Unsafe DC plan  Dispo: The patient is from: Home              Anticipated d/c is to: SNF, per [facility], she has been accepted to SNF but they are not able to take her until tomorrow.              Patient currently is medically stable to d/c.   Difficult to place patient No         Consultants:  None  Procedures:  None  Antimicrobials:  Rocephin, azithromycin, remdesivir 8/26----   Subjective: Seen and examined.  She has no complaints. Objective: Vitals:   06/30/21 2350 07/01/21 0351 07/01/21 0500 07/01/21 0803  BP: (!) 151/73 118/64  128/63  Pulse: Marland Kitchen)  57 (!) 49 (!) 59 72  Resp: 16 13  (!) 25  Temp: 97.9 F (36.6 C) 98.9 F (37.2 C)  98.2 F (36.8 C)  TempSrc: Axillary Axillary  Axillary  SpO2:  100%  100%  Weight:       No intake or output data in the 24 hours ending 07/01/21 1210  Filed Weights   06/24/21 1706   Weight: 47.5 kg    Examination:  General exam: Appears calm and comfortable  Respiratory system: Clear to auscultation. Respiratory effort normal. Cardiovascular system: S1 & S2 heard, RRR. No JVD, murmurs, rubs, gallops or clicks. No pedal edema. Gastrointestinal system: Abdomen is nondistended, soft and nontender. No organomegaly or masses felt. Normal bowel sounds heard. Central nervous system: Alert and oriented. No focal neurological deficits. Extremities: Symmetric 5 x 5 power. Skin: No rashes, lesions or ulcers.     Data Reviewed: I have personally reviewed following labs and imaging studies  CBC: Recent Labs  Lab 06/25/21 0239 06/26/21 0327 07/01/21 0814  WBC 3.9* 3.2* 6.8  NEUTROABS 2.9 2.3 5.1  HGB 9.3* 9.2* 11.6*  HCT 28.8* 27.6* 36.3  MCV 95.7 92.6 95.3  PLT 357 344 418*    Basic Metabolic Panel: Recent Labs  Lab 06/25/21 0239 06/26/21 0327 06/27/21 0958 07/01/21 0814  NA 139 133* 134* 134*  K 4.3 4.5 3.7 4.2  CL 106 101 99 100  CO2 '27 27 27 27  '$ GLUCOSE 69* 91 95 91  BUN '21 19 16 '$ 27*  CREATININE 0.57 0.56 0.60 0.66  CALCIUM 9.0 8.8* 8.8* 9.6  MG 1.7 1.6* 1.9 1.9    GFR: Estimated Creatinine Clearance: 32.9 mL/min (by C-G formula based on SCr of 0.66 mg/dL). Liver Function Tests: Recent Labs  Lab 06/25/21 0239 06/26/21 0327  AST 31 37  ALT 20 28  ALKPHOS 78 74  BILITOT 0.3 0.6  PROT 5.9* 5.5*  ALBUMIN 2.4* 2.2*    No results for input(s): LIPASE, AMYLASE in the last 168 hours. No results for input(s): AMMONIA in the last 168 hours. Coagulation Profile: No results for input(s): INR, PROTIME in the last 168 hours.  Cardiac Enzymes: No results for input(s): CKTOTAL, CKMB, CKMBINDEX, TROPONINI in the last 168 hours. BNP (last 3 results) No results for input(s): PROBNP in the last 8760 hours. HbA1C: No results for input(s): HGBA1C in the last 72 hours. CBG: No results for input(s): GLUCAP in the last 168 hours. Lipid Profile: No  results for input(s): CHOL, HDL, LDLCALC, TRIG, CHOLHDL, LDLDIRECT in the last 72 hours. Thyroid Function Tests: No results for input(s): TSH, T4TOTAL, FREET4, T3FREE, THYROIDAB in the last 72 hours. Anemia Panel: No results for input(s): VITAMINB12, FOLATE, FERRITIN, TIBC, IRON, RETICCTPCT in the last 72 hours. Sepsis Labs: No results for input(s): PROCALCITON, LATICACIDVEN in the last 168 hours.   Recent Results (from the past 240 hour(s))  Blood Culture (routine x 2)     Status: None   Collection Time: 06/21/21  4:30 PM   Specimen: BLOOD  Result Value Ref Range Status   Specimen Description BLOOD BLOOD RIGHT FOREARM  Final   Special Requests   Final    BOTTLES DRAWN AEROBIC AND ANAEROBIC Blood Culture adequate volume   Culture   Final    NO GROWTH 5 DAYS Performed at [facility], [address], [place], [place] [address]    Report Status 06/26/2021 FINAL  Final  Blood Culture (routine x 2)     Status: None  Collection Time: 06/21/21  4:35 PM   Specimen: BLOOD  Result Value Ref Range Status   Specimen Description BLOOD RIGHT ANTECUBITAL  Final   Special Requests   Final    BOTTLES DRAWN AEROBIC AND ANAEROBIC Blood Culture results may not be optimal due to an excessive volume of blood received in culture bottles   Culture   Final    NO GROWTH 5 DAYS Performed at [facility] [address], [place], [place] [address]    Report Status 06/26/2021 FINAL  Final  Resp Panel by RT-PCR (Flu A&B, Covid) Nasopharyngeal Swab     Status: Abnormal   Collection Time: 06/21/21  4:40 PM   Specimen: Nasopharyngeal Swab; Nasopharyngeal(NP) swabs in vial transport medium  Result Value Ref Range Status   SARS Coronavirus 2 by RT PCR POSITIVE (A) NEGATIVE Final    Comment: RESULT CALLED TO, READ BACK BY AND VERIFIED WITH: C,MAS '@[id number]'$  06/21/21 EB (NOTE) SARS-CoV-2 target nucleic acids are DETECTED.  The SARS-CoV-2 RNA is generally detectable in upper respiratory specimens  during the acute phase of infection. Positive results are indicative of the presence of the identified virus, but do not rule out bacterial infection or co-infection with other pathogens not detected by the test. Clinical correlation with patient history and other diagnostic information is necessary to determine patient infection status. The expected result is Negative.  Fact Sheet for Patients: EntrepreneurPulse.com.au  Fact Sheet for Healthcare Providers: IncredibleEmployment.be  This test is not yet approved or cleared by the [place] FDA and  has been authorized for detection and/or diagnosis of SARS-CoV-2 by FDA under an Emergency Use Authorization (EUA).  This EUA will remain in effect (meaning this test can be used) for  the duration of  the COVID-19 declaration under Section 564(b)(1) of the Act, 21 U.S.C. section 360bbb-3(b)(1), unless the authorization is terminated or revoked sooner.     Influenza A by PCR NEGATIVE NEGATIVE Final   Influenza B by PCR NEGATIVE NEGATIVE Final    Comment: (NOTE) The Xpert Xpress SARS-CoV-2/FLU/RSV plus assay is intended as an aid in the diagnosis of influenza from Nasopharyngeal swab specimens and should not be used as a sole basis for treatment. Nasal washings and aspirates are unacceptable for Xpert Xpress SARS-CoV-2/FLU/RSV testing.  Fact Sheet for Patients: EntrepreneurPulse.com.au  Fact Sheet for Healthcare Providers: IncredibleEmployment.be  This test is not yet approved or cleared by the [place] FDA and has been authorized for detection and/or diagnosis of SARS-CoV-2 by FDA under an Emergency Use Authorization (EUA). This EUA will remain in effect (meaning this test can be used) for the duration of the COVID-19 declaration under Section 564(b)(1) of the Act, 21 U.S.C. section 360bbb-3(b)(1), unless the authorization is terminated  or revoked.  Performed at [facility], [place] [address] [place], [place] [address]   Urine Culture     Status: None   Collection Time: 06/22/21  1:30 AM   Specimen: In/Out Cath Urine  Result Value Ref Range Status   Specimen Description IN/OUT CATH URINE  Final   Special Requests NONE  Final   Culture   Final    NO GROWTH Performed at [facility], [place] [address] [place], [place] [address]    Report Status 06/23/2021 FINAL  Final      Radiology Studies: No results found.   Scheduled Meds:  vitamin C  500 mg Oral Daily   aspirin EC  81 mg Oral Daily   enoxaparin (LOVENOX)  injection  30 mg Subcutaneous Q24H   escitalopram  20 mg Oral Daily   fluticasone  2 spray Each Nare Daily   fluticasone furoate-vilanterol  1 puff Inhalation Daily   multivitamin with minerals  1 tablet Oral Daily   potassium chloride  40 mEq Oral Daily   zinc sulfate  220 mg Oral Daily   Continuous Infusions:    LOS: 10 days   Time spent: 24 minutes  Darliss Cheney, MD [facility]

## 2021-07-02 ENCOUNTER — Encounter (HOSPITAL_COMMUNITY): Payer: Self-pay

## 2021-07-02 ENCOUNTER — Inpatient Hospital Stay (HOSPITAL_COMMUNITY)
Admission: EM | Admit: 2021-07-02 | Discharge: 2021-07-05 | Disposition: A | Discharge status: Hospice | Payer: Medicare Other | Source: Skilled Nursing Facility | Attending: Internal Medicine | Admitting: Internal Medicine

## 2021-07-02 ENCOUNTER — Emergency Department (HOSPITAL_COMMUNITY): Payer: Medicare Other

## 2021-07-02 DIAGNOSIS — E559 Vitamin D deficiency, unspecified: Secondary | ICD-10-CM | POA: Diagnosis present

## 2021-07-02 DIAGNOSIS — Z955 Presence of coronary angioplasty implant and graft: Secondary | ICD-10-CM

## 2021-07-02 DIAGNOSIS — Z7951 Long term (current) use of inhaled steroids: Secondary | ICD-10-CM

## 2021-07-02 DIAGNOSIS — R1312 Dysphagia, oropharyngeal phase: Secondary | ICD-10-CM | POA: Diagnosis present

## 2021-07-02 DIAGNOSIS — I2510 Atherosclerotic heart disease of native coronary artery without angina pectoris: Secondary | ICD-10-CM | POA: Diagnosis present

## 2021-07-02 DIAGNOSIS — Z952 Presence of prosthetic heart valve: Secondary | ICD-10-CM

## 2021-07-02 DIAGNOSIS — E782 Mixed hyperlipidemia: Secondary | ICD-10-CM | POA: Diagnosis present

## 2021-07-02 DIAGNOSIS — R64 Cachexia: Secondary | ICD-10-CM | POA: Diagnosis present

## 2021-07-02 DIAGNOSIS — Z66 Do not resuscitate: Secondary | ICD-10-CM | POA: Diagnosis present

## 2021-07-02 DIAGNOSIS — J690 Pneumonitis due to inhalation of food and vomit: Secondary | ICD-10-CM | POA: Diagnosis present

## 2021-07-02 DIAGNOSIS — Z515 Encounter for palliative care: Secondary | ICD-10-CM

## 2021-07-02 DIAGNOSIS — Z90710 Acquired absence of both cervix and uterus: Secondary | ICD-10-CM

## 2021-07-02 DIAGNOSIS — M797 Fibromyalgia: Secondary | ICD-10-CM | POA: Diagnosis present

## 2021-07-02 DIAGNOSIS — Z86711 Personal history of pulmonary embolism: Secondary | ICD-10-CM

## 2021-07-02 DIAGNOSIS — Z681 Body mass index (BMI) 19 or less, adult: Secondary | ICD-10-CM

## 2021-07-02 DIAGNOSIS — I5032 Chronic diastolic (congestive) heart failure: Secondary | ICD-10-CM | POA: Diagnosis present

## 2021-07-02 DIAGNOSIS — F411 Generalized anxiety disorder: Secondary | ICD-10-CM | POA: Diagnosis present

## 2021-07-02 DIAGNOSIS — Z79899 Other long term (current) drug therapy: Secondary | ICD-10-CM

## 2021-07-02 DIAGNOSIS — J449 Chronic obstructive pulmonary disease, unspecified: Secondary | ICD-10-CM | POA: Diagnosis present

## 2021-07-02 DIAGNOSIS — Z7982 Long term (current) use of aspirin: Secondary | ICD-10-CM

## 2021-07-02 DIAGNOSIS — U071 COVID-19: Secondary | ICD-10-CM

## 2021-07-02 DIAGNOSIS — Z8616 Personal history of COVID-19: Secondary | ICD-10-CM

## 2021-07-02 DIAGNOSIS — J9621 Acute and chronic respiratory failure with hypoxia: Secondary | ICD-10-CM | POA: Diagnosis present

## 2021-07-02 DIAGNOSIS — Z87891 Personal history of nicotine dependence: Secondary | ICD-10-CM

## 2021-07-02 DIAGNOSIS — J9601 Acute respiratory failure with hypoxia: Secondary | ICD-10-CM

## 2021-07-02 LAB — I-STAT ARTERIAL BLOOD GAS, ED
Acid-Base Excess: 2 mmol/L (ref 0.0–2.0)
Bicarbonate: 25.8 mmol/L (ref 20.0–28.0)
Calcium, Ion: 1.3 mmol/L (ref 1.15–1.40)
HCT: 35 % — ABNORMAL LOW (ref 36.0–46.0)
Hemoglobin: 11.9 g/dL — ABNORMAL LOW (ref 12.0–15.0)
O2 Saturation: 95 %
Patient temperature: 97.9
Potassium: 4.7 mmol/L (ref 3.5–5.1)
Sodium: 138 mmol/L (ref 135–145)
TCO2: 27 mmol/L (ref 22–32)
pCO2 arterial: 35 mmHg (ref 32.0–48.0)
pH, Arterial: 7.475 — ABNORMAL HIGH (ref 7.350–7.450)
pO2, Arterial: 68 mmHg — ABNORMAL LOW (ref 83.0–108.0)

## 2021-07-02 LAB — D-DIMER, QUANTITATIVE: D-Dimer, Quant: 1.41 ug/mL-FEU — ABNORMAL HIGH (ref 0.00–0.50)

## 2021-07-02 MED ORDER — ONDANSETRON HCL 4 MG PO TABS: 4.0000 mg | ORAL_TABLET | Freq: Four times a day (QID) | ORAL | Status: DC | PRN

## 2021-07-02 MED ORDER — POLYVINYL ALCOHOL 1.4 % OP SOLN: 1.0000 [drp] | Freq: Four times a day (QID) | OPHTHALMIC | Status: DC | PRN

## 2021-07-02 MED ORDER — GLYCOPYRROLATE 1 MG PO TABS
1.0000 mg | ORAL_TABLET | ORAL | Status: DC | PRN
  Filled 2021-07-02: qty 1

## 2021-07-02 MED ORDER — ONDANSETRON 4 MG PO TBDP: 4.0000 mg | ORAL_TABLET | Freq: Four times a day (QID) | ORAL | Status: DC | PRN

## 2021-07-02 MED ORDER — SODIUM CHLORIDE 0.9 % IV SOLN
1.0000 g | Freq: Once | INTRAVENOUS | Status: AC
  Administered 2021-07-02: 1 g via INTRAVENOUS
  Filled 2021-07-02: qty 1

## 2021-07-02 MED ORDER — MORPHINE SULFATE (CONCENTRATE) 10 MG/0.5ML PO SOLN
5.0000 mg | ORAL | Status: DC | PRN
Start: 2021-07-02 — End: 2021-07-06

## 2021-07-02 MED ORDER — HALOPERIDOL 0.5 MG PO TABS
0.5000 mg | ORAL_TABLET | ORAL | Status: DC | PRN
  Filled 2021-07-02: qty 1

## 2021-07-02 MED ORDER — SENNOSIDES-DOCUSATE SODIUM 8.6-50 MG PO TABS: 1.0000 | ORAL_TABLET | Freq: Every evening | ORAL | Status: DC | PRN

## 2021-07-02 MED ORDER — ACETAMINOPHEN 650 MG RE SUPP: 650.0000 mg | Freq: Four times a day (QID) | RECTAL | Status: DC | PRN

## 2021-07-02 MED ORDER — ONDANSETRON HCL 4 MG/2ML IJ SOLN: 4.0000 mg | Freq: Four times a day (QID) | INTRAMUSCULAR | Status: DC | PRN

## 2021-07-02 MED ORDER — VANCOMYCIN HCL IN DEXTROSE 1-5 GM/200ML-% IV SOLN
1000.0000 mg | Freq: Once | INTRAVENOUS | Status: AC
  Administered 2021-07-02: 1000 mg via INTRAVENOUS
  Filled 2021-07-02: qty 200

## 2021-07-02 MED ORDER — SODIUM CHLORIDE 0.9 % IV SOLN: 1.0000 g | Freq: Three times a day (TID) | INTRAVENOUS | Status: DC

## 2021-07-02 MED ORDER — GLYCOPYRROLATE 0.2 MG/ML IJ SOLN: 0.2000 mg | INTRAMUSCULAR | Status: DC | PRN

## 2021-07-02 MED ORDER — LORAZEPAM 2 MG/ML IJ SOLN
1.0000 mg | INTRAMUSCULAR | Status: DC | PRN
  Administered 2021-07-03 – 2021-07-05 (×5): 1 mg via INTRAVENOUS
  Filled 2021-07-02 (×5): qty 1

## 2021-07-02 MED ORDER — HALOPERIDOL LACTATE 2 MG/ML PO CONC
0.5000 mg | ORAL | Status: DC | PRN
  Filled 2021-07-02: qty 0.3

## 2021-07-02 MED ORDER — MORPHINE SULFATE (PF) 2 MG/ML IV SOLN
1.0000 mg | INTRAVENOUS | Status: DC | PRN
  Administered 2021-07-03 – 2021-07-05 (×6): 1 mg via INTRAVENOUS
  Filled 2021-07-02 (×7): qty 1

## 2021-07-02 MED ORDER — LORAZEPAM 2 MG/ML PO CONC: 1.0000 mg | ORAL | Status: DC | PRN

## 2021-07-02 MED ORDER — BIOTENE DRY MOUTH MT LIQD
15.0000 mL | OROMUCOSAL | Status: DC | PRN
  Administered 2021-07-05: 15 mL via TOPICAL

## 2021-07-02 MED ORDER — LORAZEPAM 2 MG/ML IJ SOLN
0.5000 mg | Freq: Once | INTRAMUSCULAR | Status: AC
  Administered 2021-07-02: 0.5 mg via INTRAVENOUS
  Filled 2021-07-02: qty 1

## 2021-07-02 MED ORDER — ACETAMINOPHEN 650 MG RE SUPP
650.0000 mg | Freq: Four times a day (QID) | RECTAL | Status: DC | PRN
Start: 2021-07-02 — End: 2021-07-02

## 2021-07-02 MED ORDER — DIPHENHYDRAMINE HCL 50 MG/ML IJ SOLN
25.0000 mg | Freq: Once | INTRAMUSCULAR | Status: AC
  Administered 2021-07-02: 25 mg via INTRAVENOUS
  Filled 2021-07-02: qty 1

## 2021-07-02 MED ORDER — VANCOMYCIN HCL 500 MG/100ML IV SOLN: 500.0000 mg | INTRAVENOUS | Status: DC

## 2021-07-02 MED ORDER — ACETAMINOPHEN 325 MG PO TABS: 650.0000 mg | ORAL_TABLET | Freq: Four times a day (QID) | ORAL | Status: DC | PRN

## 2021-07-02 MED ORDER — HEPARIN SODIUM (PORCINE) 5000 UNIT/ML IJ SOLN
5000.0000 [IU] | Freq: Three times a day (TID) | INTRAMUSCULAR | Status: DC
  Administered 2021-07-02: 5000 [IU] via SUBCUTANEOUS
  Filled 2021-07-02: qty 1

## 2021-07-02 MED ORDER — METHYLPREDNISOLONE SODIUM SUCC 125 MG IJ SOLR
125.0000 mg | Freq: Once | INTRAMUSCULAR | Status: AC
  Administered 2021-07-02: 125 mg via INTRAVENOUS
  Filled 2021-07-02: qty 2

## 2021-07-02 MED ORDER — HALOPERIDOL LACTATE 5 MG/ML IJ SOLN: 0.5000 mg | INTRAMUSCULAR | Status: DC | PRN

## 2021-07-02 MED ORDER — SODIUM CHLORIDE 0.9 % IV SOLN: 2.0000 g | Freq: Two times a day (BID) | INTRAVENOUS | Status: DC

## 2021-07-02 MED ORDER — LORAZEPAM 1 MG PO TABS: 1.0000 mg | ORAL_TABLET | ORAL | Status: DC | PRN

## 2021-07-02 NOTE — TOC Transition Note (Signed)
Transition of Care [facility]) - CM/SW Discharge Note   Patient Details  Name: Jessica [name] MRN: [id number] Date of Birth: [birth year]  [facility] CM/SW Contact:  Joanne Chars, LCSW Phone Number: 07/02/2021, 11:04 AM   Clinical Narrative:   Pt discharging to [place] room 202.  RN call report to [phone].     Final next level of care: Skilled Nursing Facility Barriers to Discharge: Barriers Resolved   Patient Goals and CMS Choice   CMS Medicare.gov Compare Post Acute Care list provided to:: Patient Represenative (must comment) Choice offered to / list presented to : Adult Children  Discharge Placement              Patient chooses bed at:  ([facility]) Patient to be transferred to facility by: [facility] Name of family member notified: daughter [name] Patient and family notified of of transfer: 07/02/21  Discharge Plan and Services     Post Acute Care Choice: [place]                               Social Determinants of Health (SDOH) Interventions     Readmission Risk Interventions Readmission Risk Prevention Plan 04/01/2019  Post Dischage Appt Complete  Medication Screening Complete  Transportation Screening Complete  Some recent data might be hidden

## 2021-07-02 NOTE — Discharge Summary (Signed)
Physician Discharge Summary  Jessica [name] [id number] DOB: 1926/01/23 DOA: 06/21/2021  PCP: [name], [name], MD  Admit date: 06/21/2021 Discharge date: 07/02/2021 30 Day Unplanned Readmission Risk Score    Flowsheet Row ED to Hosp-Admission (Current) from 06/21/2021 in [place] [facility] [place] [facility]  30 Day Unplanned Readmission Risk Score (%) 19.8 Filed at 07/02/2021 0400       This score is the patient's risk of an unplanned readmission within 30 days of being discharged (0 -100%). The score is based on dignosis, age, lab data, medications, orders, and past utilization.   Low:  0-14.9   Medium: 15-21.9   High: 22-29.9   Extreme: 30 and above          Admitted From: Home Disposition: SNF  Recommendations for Outpatient Follow-up:  Follow up with PCP in 1-2 weeks Please obtain BMP/CBC in one week Please follow up with your PCP on the following pending results: Unresulted Labs (From admission, onward)    None         Home Health: None Equipment/Devices: None  Discharge Condition: Stable CODE STATUS: DNR Diet recommendation: Dysphagia 1 diet  Subjective: Seen and examined.  Alert and oriented.  Very hard of hearing.  No complaints.  Looks comfortable.  Brief/Interim Summary: 87 year old with chronic debility, COPD and chronic hypoxemic respiratory failure on 3 L oxygen at home, chronic aspiration with dysphagia, diastolic heart failure, generalized anxiety disorder, severe aortic regurgitation and status post TAVR and coronary artery disease brought to the emergency room with progressive worsening shortness of breath, weakness and confusion.  Patient lives with her daughter who developed COVID-19 infection about 9 days prior to patient's admission and improved.  Patient started symptoms about 2 to 3 days before admission with increasing lethargic.  In the emergency room, COVID-19 positive.  Chest x-ray with bilateral pneumonia right more than left.  Temperature 104.9.   Lactic acid 2.5.  Resuscitated with IV fluids.  Started on antibiotics and antiviral therapies and admitted to the hospital.  Acute on chronic hypoxemic respiratory failure uses 3 L oxygen at home.Pneumonia due to COVID-19 virus infection: Sepsis present on admission with organ dysfunction secondary to aspiration pneumonia.  Patient completed 5 days remdesivir on 06/25/2021, completed Rocephin and Zithromax for aspiration pneumonia on 06/25/2021, and 10 days of dexamethasone today.  Patient has remained on 2 to 3 L of oxygen since last 6 to 7 days which is her baseline.  Patient was kept in the hospital only because SNF would not take her until she is 10 days past COVID test.  She qualified for 87 discharge yesterday however due to long weekend, SNF did not have staffing for new admission so she is being discharged today in a stable condition.  Acute metabolic encephalopathy: Due to above.  Currently resolved.  Chronic diastolic heart failure: Well compensated.  Dehydrated now.   Coronary artery disease: No evidence of ischemia.  Currently on a statin and aspirin.   Hypomagnesemia: Replaced.    Goal of care: Previous hospitalist had a long discussion with the daughter and after discussion and having palliative care on board, CODE STATUS was changed to DNR.  They decided to let her eat honey thick liquids and pured diet with continuous close monitoring. Palliative care on constant communications with family.  I personally spoke to the daughter and she told me that she would like to pursue rehab/SNF for the patient.  Palliative care was on board as well.  Discharge Diagnoses:  Principal Problem:  Acute on chronic respiratory failure with hypoxia (HCC) Active Problems:   Mixed hyperlipidemia   Coronary artery disease involving native coronary artery of native heart without angina pectoris   Chronic diastolic CHF (congestive heart failure) ([place])   COVID-19 virus infection   Aspiration pneumonia of  right lung due to gastric secretions (HCC)   Acute metabolic encephalopathy   Lactic acidosis   Sepsis with organ dysfunction [facility])   Dysphagia    Discharge Instructions   Allergies as of 07/02/2021       Reactions   Contrast Media [iodinated Diagnostic Agents] Itching, Rash, Other (See Comments)   Hypotension, Skin turns red like a sunburn   Iohexol Itching, Rash, Other (See Comments)   Hypotension, Skin turns red like a sunburn        Medication List     STOP taking these medications    HYDROcodone-acetaminophen 5-325 MG tablet Commonly known as: Norco   predniSONE 10 MG (21) Tbpk tablet Commonly known as: STERAPRED UNI-PAK 21 TAB   tiZANidine 2 MG tablet Commonly known as: ZANAFLEX       TAKE these medications    albuterol (2.5 MG/3ML) 0.083% nebulizer solution Commonly known as: PROVENTIL USE 1 VIAL IN NEBULIZER 6 TIMES DAILY - (for rescue) (Max 30 doses per month)   albuterol 108 (90 Base) MCG/ACT inhaler Commonly known as: VENTOLIN HFA INHALE 2 PUFFS BY MOUTH EVERY 6 HOURS AS NEEDED FOR WHEEZING   ALPRAZolam 0.5 MG tablet Commonly known as: XANAX Take 1 tablet (0.5 mg total) by mouth 2 (two) times daily as needed for anxiety.   AMBULATORY NON FORMULARY MEDICATION Medication Name: Incentive spirometer Use 10-15 times per day   aspirin EC 81 MG tablet Take 1 tablet (81 mg total) by mouth daily.   Brovana 15 MCG/2ML Nebu Generic drug: arformoterol USE 1 VIAL  IN  NEBULIZER TWICE  DAILY - morning and evening   budesonide 0.5 MG/2ML nebulizer solution Commonly known as: PULMICORT USE 1 VIAL  IN  NEBULIZER TWICE  DAILY - Rinse mouth after treatment   escitalopram 20 MG tablet Commonly known as: LEXAPRO TAKE 1 TABLET BY MOUTH EVERY DAY   fluticasone 50 MCG/ACT nasal spray Commonly known as: FLONASE Place 2 sprays into both nostrils daily.   Flutter Devi 1 each by Does not apply route daily.   senna-docusate 8.6-50 MG tablet Commonly known  as: Senokot-S Take 2 tablets by mouth 2 (two) times daily as needed for mild constipation or moderate constipation.   vitamin B-12 1000 MCG tablet Commonly known as: CYANOCOBALAMIN Take 1,000 mcg by mouth daily.        Follow-up Information     [name], [name], MD Follow up in 1 week(s).   Specialty: Family Medicine Contact information: [place] [address] [phone]         [name], MD .   Specialty: Cardiology Contact information: [place] [address] [phone]                Allergies  Allergen Reactions   Contrast Media [Iodinated Diagnostic Agents] Itching, Rash and Other (See Comments)    Hypotension, Skin turns red like a sunburn   Iohexol Itching, Rash and Other (See Comments)    Hypotension, Skin turns red like a sunburn    Consultations: Palliative care   Procedures/Studies: DG CHEST PORT 1 VIEW  Result Date: 06/25/2021 CLINICAL DATA:  COVID-19 positivity with respiratory failure EXAM: PORTABLE  CHEST 1 VIEW COMPARISON:  06/21/2021 FINDINGS: Cardiac shadow is stable. Changes of prior TAVR are seen. Aortic calcifications are again noted. Some increase in the degree of left retrocardiac density is seen when compare with the prior exam. New small pleural effusions are noted bilaterally. No other focal infiltrate is seen. Skin fold is noted over the left chest. Postsurgical changes in the right shoulder are noted. IMPRESSION: New bilateral small effusions.  Mild left retrocardiac opacity. Electronically Signed   By: Inez Catalina M.D.   On: 06/25/2021 08:43   DG Chest Port 1 View  Result Date: 06/21/2021 CLINICAL DATA:  87 year old female with questionable sepsis EXAM: PORTABLE CHEST 1 VIEW COMPARISON:  02/03/2021 FINDINGS: Cardiomediastinal silhouette unchanged in size and contour. No evidence of central vascular congestion. No interlobular septal thickening. Changes of prior TAVR again  noted. Increased reticulonodular opacities of the right lung. No pneumothorax or pleural effusion. No pneumothorax or pleural effusion. No acute displaced fracture. Surgical changes of the right glenohumeral joint. Chronic deformity of the right clavicle. IMPRESSION: Increased right-sided reticulonodular opacities, concerning for multifocal infection or possibly aspiration. Surgical changes of prior TAVR. Electronically Signed   By: Corrie Mckusick D.O.   On: 06/21/2021 16:37   DG Swallowing Func-Speech Pathology  Result Date: 06/26/2021 Table formatting from the original result was not included. Objective Swallowing Evaluation: Type of Study: Bedside Swallow Evaluation  Patient Details Name: ZENITH DISS MRN: [id number] Date of Birth: 02-01-1926 Today's Date: 06/26/2021 Time: SLP Start Time (ACUTE ONLY): 66 -SLP Stop Time (ACUTE ONLY): 1500 SLP Time Calculation (min) (ACUTE ONLY): 25 min Past Medical History: Past Medical History: Diagnosis Date  Anxiety   congenital nystagmus   COPD (chronic obstructive pulmonary disease) (HCC)   Coronary artery disease   DJD (degenerative joint disease)   Fibromyalgia   Low back pain syndrome   Memory loss   Other and unspecified hyperlipidemia   Pancreatic lesion   Pneumonia   S/P TAVR (transcatheter aortic valve replacement)   26 mm Edwards Sapien 3 via the TF approach  Severe aortic stenosis   Venous insufficiency  Past Surgical History: Past Surgical History: Procedure Laterality Date  ABDOMINAL HYSTERECTOMY    ANTERIOR CERVICAL DISCECTOMY    APPENDECTOMY    CARPAL TUNNEL RELEASE  12/2011  right arm  CATARACT EXTRACTION    CORONARY ATHERECTOMY  07/23/2018  PTCA/orbital atherectomy/DES x 1 proximal to mid LAD  CORONARY ATHERECTOMY N/A 07/23/2018  Procedure: CORONARY ATHERECTOMY;  Surgeon: Burnell Blanks, MD;  Location: [facility];  Service: Cardiovascular;  Laterality: N/A;  CORONARY STENT INTERVENTION    CORONARY STENT INTERVENTION N/A 07/23/2018   Procedure: CORONARY STENT INTERVENTION;  Surgeon: Burnell Blanks, MD;  Location: [place] [facility];  Service: Cardiovascular;  Laterality: N/A;  EYE SURGERY    INTRAMEDULLARY (IM) NAIL INTERTROCHANTERIC Right 09/19/2019  Procedure: INTRAMEDULLARY (IM) NAIL INTERTROCHANTRIC;  Surgeon: Renette Butters, MD;  Location: [facility];  Service: Orthopedics;  Laterality: Right;  LUMBAR LAMINECTOMY    right knee arthroscopy    right shoulder replacement  04/2009  right shoulder surgery  2008  Dr. Noemi Chapel  RIGHT/LEFT HEART CATH AND CORONARY ANGIOGRAPHY N/A 07/07/2018  Procedure: RIGHT/LEFT HEART CATH AND CORONARY ANGIOGRAPHY;  Surgeon: Burnell Blanks, MD;  Location: [facility];  Service: Cardiovascular;  Laterality: N/A;  TEE WITHOUT CARDIOVERSION N/A 03/29/2019  Procedure: TRANSESOPHAGEAL ECHOCARDIOGRAM (TEE);  Surgeon: Burnell Blanks, MD;  Location: [facility];  Service: Open Heart Surgery;  Laterality: N/A;  TRANSCATHETER AORTIC VALVE REPLACEMENT, TRANSFEMORAL N/A 03/29/2019  Procedure: TRANSCATHETER AORTIC VALVE REPLACEMENT, TRANSFEMORAL;  Surgeon: Burnell Blanks, MD;  Location: [facility];  Service: Open Heart Surgery;  Laterality: N/A;  ULTRASOUND GUIDANCE FOR VASCULAR ACCESS  07/07/2018  Procedure: Ultrasound Guidance For Vascular Access;  Surgeon: Burnell Blanks, MD;  Location: [facility];  Service: Cardiovascular;; HPI: 87 year old female with past medical history of COPD, chronic respiratory failure, dysphagia, diastolic congestive heart failure (Echo 03/2020 EF 70-75%), generalized anxiety disorder, severe aortic regurgitation status post TAVR and coronary artery disease (cardiac cath 06/2018  with arthrectomy and DES to LAD) who presents to [facility] emergency department via EMS due to progressively worsening shortness of breath weakness and confusion. Per chart coughing when eating recently. MBS 12/16/18: moderate pharyngeal dysphagia marked by delayed initiation of  swallow at pyriforms/vallecula, inconsistent clearance of penetration/aspiration, and suspicion of esophageal dysfunction; sensed aspiration unable to clear, Dys 2/nectar recommended.  No data recorded Assessment / Plan / Recommendation CHL IP CLINICAL IMPRESSIONS 06/26/2021 Clinical Impression Pt presents with severe oropharyngeal dysphagia characterized by impaired bolus propulsion, reduced bolus cohesion, reduced lingual retraction, a pharyngeal delay, and reduced anterior laryngeal movement. She demonstrated premature spillage to the valleculae and pyriform sinuses, vallecular residue, pyriform sinus residue, and the swallow was often triggered with >50% of the bolus at the level of pyriform sinuses. Hardware from ACDF noted and facilitated anterior protrusion of the posterior pharyngeal wall with some mild residue. Aspiration (PAS 7) was noted during deglutition with honey thick and nectar thick liquids via cup, straw, or full tsp. Laryngeal invasion was improved to penetration (PAS 3,5) with 1/2 tsp boluses of honey thick liquids and with cued use of a chin tuck posture; however, pt demonstrated difficulty consistently using a chin tuck posture. Pt often sensed instances of penetration and her independent use of coughing was effective in expelling some of the penetrate prior to deglutition. Pt's cough was ineffective in mobilizing aspirate. Pt's swallow function is notably worse than during the last study on 12/16/18. Pt's risk of aspiration is judged to be high before, during, and after deglutition. Pt's aspiration risk has been thoroughly discussed with the pt's daughter by this SLP and various providers. Pt's current diet will be continued with known aspiration risk and with strict observance of swallowing precautions to attempt to reduce risk. SLP will continue to follow pt. SLP Visit Diagnosis Dysphagia, unspecified (R13.10) Attention and concentration deficit following -- Frontal lobe and executive  function deficit following -- Impact on safety and function Severe aspiration risk;Risk for inadequate nutrition/hydration   CHL IP TREATMENT RECOMMENDATION 06/26/2021 Treatment Recommendations Therapy as outlined in treatment plan below   Prognosis 06/26/2021 Prognosis for Safe Diet Advancement Guarded Barriers to Reach Goals Cognitive deficits;Severity of deficits;Time post onset Barriers/Prognosis Comment -- CHL IP DIET RECOMMENDATION 06/26/2021 SLP Diet Recommendations Dysphagia 1 (Puree) solids;Honey thick liquids Liquid Administration via Spoon;No straw Medication Administration Crushed with puree Compensations Slow rate;Small sips/bites;Follow solids with liquid;Minimize environmental distractions Postural Changes Seated upright at 90 degrees   CHL IP OTHER RECOMMENDATIONS 06/26/2021 Recommended Consults -- Oral Care Recommendations Oral care BID;Staff/trained caregiver to provide oral care Other Recommendations Order thickener from pharmacy   CHL IP FOLLOW UP RECOMMENDATIONS 06/26/2021 Follow up Recommendations Skilled Nursing facility   [facility] IP FREQUENCY AND DURATION 06/26/2021 Speech Therapy Frequency (ACUTE ONLY) min 2x/week Treatment Duration 2 weeks      CHL IP ORAL PHASE 06/26/2021 Oral Phase Impaired Oral - Pudding Teaspoon -- Oral -  Pudding Cup -- Oral - Honey Teaspoon Decreased bolus cohesion;Premature spillage;Reduced posterior propulsion Oral - Honey Cup Decreased bolus cohesion;Premature spillage;Reduced posterior propulsion Oral - Nectar Teaspoon Decreased bolus cohesion;Premature spillage;Reduced posterior propulsion Oral - Nectar Cup Decreased velopharyngeal closure Oral - Nectar Straw -- Oral - Thin Teaspoon -- Oral - Thin Cup -- Oral - Thin Straw -- Oral - Puree Decreased bolus cohesion;Premature spillage;Reduced posterior propulsion Oral - Mech Soft -- Oral - Regular -- Oral - Multi-Consistency -- Oral - Pill -- Oral Phase - Comment --  CHL IP PHARYNGEAL PHASE 06/26/2021 Pharyngeal Phase Impaired  Pharyngeal- Pudding Teaspoon -- Pharyngeal -- Pharyngeal- Pudding Cup -- Pharyngeal -- Pharyngeal- Honey Teaspoon -- Pharyngeal -- Pharyngeal- Honey Cup Pharyngeal residue - valleculae;Pharyngeal residue - pyriform;Moderate aspiration;Significant aspiration (Amount);Penetration/Aspiration during swallow;Penetration/Apiration after swallow;Penetration/Aspiration before swallow;Delayed swallow initiation-pyriform sinuses;Reduced anterior laryngeal mobility;Reduced tongue base retraction Pharyngeal Material enters airway, passes BELOW cords and not ejected out despite cough attempt by patient Pharyngeal- Nectar Teaspoon Pharyngeal residue - valleculae;Pharyngeal residue - pyriform;Moderate aspiration;Significant aspiration (Amount);Penetration/Aspiration during swallow;Penetration/Apiration after swallow;Penetration/Aspiration before swallow;Delayed swallow initiation-pyriform sinuses;Reduced tongue base retraction;Reduced anterior laryngeal mobility Pharyngeal Material enters airway, passes BELOW cords and not ejected out despite cough attempt by patient;Material enters airway, CONTACTS cords and not ejected out;Material enters airway, remains ABOVE vocal cords and not ejected out Pharyngeal- Nectar Cup Pharyngeal residue - valleculae;Pharyngeal residue - pyriform;Moderate aspiration;Significant aspiration (Amount);Penetration/Aspiration during swallow;Penetration/Apiration after swallow;Penetration/Aspiration before swallow;Delayed swallow initiation-pyriform sinuses;Reduced anterior laryngeal mobility;Reduced tongue base retraction Pharyngeal Material enters airway, passes BELOW cords and not ejected out despite cough attempt by patient Pharyngeal- Nectar Straw -- Pharyngeal -- Pharyngeal- Thin Teaspoon -- Pharyngeal -- Pharyngeal- Thin Cup -- Pharyngeal -- Pharyngeal- Thin Straw -- Pharyngeal -- Pharyngeal- Puree Pharyngeal residue - valleculae;Pharyngeal residue - pyriform;Delayed swallow initiation-pyriform  sinuses;Reduced tongue base retraction;Reduced anterior laryngeal mobility Pharyngeal -- Pharyngeal- Mechanical Soft -- Pharyngeal -- Pharyngeal- Regular -- Pharyngeal -- Pharyngeal- Multi-consistency -- Pharyngeal -- Pharyngeal- Pill -- Pharyngeal -- Pharyngeal Comment --  CHL IP CERVICAL ESOPHAGEAL PHASE 12/16/2018 Cervical Esophageal Phase Impaired Pudding Teaspoon -- Pudding Cup -- Honey Teaspoon -- Honey Cup -- Nectar Teaspoon -- Nectar Cup -- Nectar Straw -- Thin Teaspoon -- Thin Cup -- Thin Straw -- Puree -- Mechanical Soft -- Regular -- Multi-consistency -- Pill -- Cervical Esophageal Comment -- Shanika I. Hardin Negus, [place], [place] Office number [phone] Pager [place] 06/26/2021, 3:59 PM                Discharge Exam: Vitals:   07/01/21 2352 07/02/21 0335  BP: 121/61 (!) 119/57  Pulse:    Resp: 18   Temp: 97.9 F (36.6 C) 98 F (36.7 C)  SpO2:     Vitals:   07/01/21 1600 07/01/21 2048 07/01/21 2352 07/02/21 0335  BP: 114/63 (!) 105/56 121/61 (!) 119/57  Pulse: 74     Resp: 20  18   Temp: 98.2 F (36.8 C) 97.9 F (36.6 C) 97.9 F (36.6 C) 98 F (36.7 C)  TempSrc: Axillary Axillary Axillary Axillary  SpO2: 99%     Weight:        General: Pt is alert, awake, not in acute distress Cardiovascular: RRR, S1/S2 +, no rubs, no gallops Respiratory: CTA bilaterally, no wheezing, no rhonchi Abdominal: Soft, NT, ND, bowel sounds + Extremities: no edema, no cyanosis    The results of significant diagnostics from this hospitalization (including imaging, microbiology, ancillary and laboratory) are listed below for reference.     Microbiology: No  results found for this or any previous visit (from the past 240 hour(s)).   Labs: BNP (last 3 results) Recent Labs    06/21/21 1619  BNP [id number]*   Basic Metabolic Panel: Recent Labs  Lab 06/26/21 0327 06/27/21 0958 07/01/21 0814  NA 133* 134* 134*  K 4.5 3.7 4.2  CL 101 99  100  CO2 '27 27 27  '$ GLUCOSE 91 95 91  BUN 19 16 27*  CREATININE 0.56 0.60 0.66  CALCIUM 8.8* 8.8* 9.6  MG 1.6* 1.9 1.9   Liver Function Tests: Recent Labs  Lab 06/26/21 0327  AST 37  ALT 28  ALKPHOS 74  BILITOT 0.6  PROT 5.5*  ALBUMIN 2.2*   No results for input(s): LIPASE, AMYLASE in the last 168 hours. No results for input(s): AMMONIA in the last 168 hours. CBC: Recent Labs  Lab 06/26/21 0327 07/01/21 0814  WBC 3.2* 6.8  NEUTROABS 2.3 5.1  HGB 9.2* 11.6*  HCT 27.6* 36.3  MCV 92.6 95.3  PLT 344 418*   Cardiac Enzymes: No results for input(s): CKTOTAL, CKMB, CKMBINDEX, TROPONINI in the last 168 hours. BNP: Invalid input(s): POCBNP CBG: No results for input(s): GLUCAP in the last 168 hours. D-Dimer No results for input(s): DDIMER in the last 72 hours. Hgb A1c No results for input(s): HGBA1C in the last 72 hours. Lipid Profile No results for input(s): CHOL, HDL, LDLCALC, TRIG, CHOLHDL, LDLDIRECT in the last 72 hours. Thyroid function studies No results for input(s): TSH, T4TOTAL, T3FREE, THYROIDAB in the last 72 hours.  Invalid input(s): FREET3 Anemia work up No results for input(s): VITAMINB12, FOLATE, FERRITIN, TIBC, IRON, RETICCTPCT in the last 72 hours. Urinalysis    Component Value Date/Time   COLORURINE AMBER (A) 06/21/2021 1610   APPEARANCEUR HAZY (A) 06/21/2021 1610   LABSPEC 1.029 06/21/2021 1610   PHURINE 6.0 06/21/2021 1610   GLUCOSEU NEGATIVE 06/21/2021 1610   GLUCOSEU NEGATIVE 07/06/2009 1057   HGBUR NEGATIVE 06/21/2021 1610   BILIRUBINUR NEGATIVE 06/21/2021 1610   BILIRUBINUR 3+ 01/16/2020 1448   KETONESUR NEGATIVE 06/21/2021 1610   PROTEINUR NEGATIVE 06/21/2021 1610   UROBILINOGEN 2.0 (A) 01/16/2020 1448   UROBILINOGEN 0.2 07/06/2009 1057   NITRITE NEGATIVE 06/21/2021 1610   LEUKOCYTESUR NEGATIVE 06/21/2021 1610   Sepsis Labs Invalid input(s): PROCALCITONIN,  WBC,  LACTICIDVEN Microbiology No results found for this or any previous  visit (from the past 240 hour(s)).   Time coordinating discharge: Over 30 minutes  SIGNED:   Darliss Cheney, MD  [facility] 07/02/2021, 7:38 AM  If 7PM-7AM, please contact night-coverage [email or web address]

## 2021-07-02 NOTE — ED Notes (Signed)
Pt daughter states pt has been very anxious. Pt has not stopped moving on the bed. Pt appears jittery. Pt daughter states pt takes Xanax for anxiety. Will notify MD.

## 2021-07-02 NOTE — ED Provider Notes (Signed)
[facility]GENCY DEPARTMENT Provider Note   CSN: [id number] Arrival date & time: 07/02/21  1456     History Chief Complaint  Patient presents with   Shortness of Breath    Jessica [name] is a 87 y.o. female.  HPI Level 5 caveat secondary to memory loss from nursing home.   87 year old female presents today via EMS Reports that patient was diagnosed with COVID-pneumonia on August 26, discharged from hospital to SNF today.   She was sent here today for worsening shortness of breath with oxygen saturations of 87 to 88% on 3 L/min.  Oxygen was increased to 4 L/min and oxygen saturations increased to 97%. Discussed care with Claiborne Billings, nurse from labs.  She states patient was arrived via transport.  She was on pulse ox, oxygen, and heart rate monitor.  Her sats dropped to 87% for about 15 minutes.  Patient was having increased respiratory rate with mouth breathing heart rate increased to 120.  They sent her back to the hospital secondary to this. Daughter Jessica [name] is now at bedside    Past Medical History:  Diagnosis Date   Anxiety    congenital nystagmus    COPD (chronic obstructive pulmonary disease) (HCC)    Coronary artery disease    DJD (degenerative joint disease)    Fibromyalgia    Low back pain syndrome    Memory loss    Other and unspecified hyperlipidemia    Pancreatic lesion    Pneumonia    S/P TAVR (transcatheter aortic valve replacement)    26 mm Edwards Sapien 3 via the TF approach   Severe aortic stenosis    Venous insufficiency     Patient Active Problem List   Diagnosis Date Noted   COVID-19 virus infection 06/21/2021   Aspiration pneumonia of right lung due to gastric secretions ([place]) 0000000   Acute metabolic encephalopathy 0000000   Lactic acidosis 06/21/2021   Sepsis with organ dysfunction ([place]) 06/21/2021   Dysphagia 06/21/2021   Pressure injury of skin 09/19/2019   Acute right hip pain 09/17/2019   S/P TAVR (transcatheter  aortic valve replacement)    Acute on chronic respiratory failure with hypoxia ([place]) 12/15/2018   Depression 12/15/2018   Coronary artery disease involving native coronary artery of native heart without angina pectoris 12/15/2018   Chronic diastolic CHF (congestive heart failure) ([place]) 12/15/2018   History of pulmonary embolism 08/25/2018   Severe aortic stenosis    Aortic atherosclerosis ([place]) 05/20/2018   Insomnia 04/08/2016   Chronic obstructive pulmonary disease ([place]) 05/07/2015   Labial cyst 05/07/2015   Exertional dyspnea 02/26/2015   Vitamin D deficiency 01/14/2009   Mixed hyperlipidemia 01/14/2009   DEGENERATIVE JOINT DISEASE 06/12/2008   Fibromyalgia 12/15/2007   Generalized anxiety disorder 12/14/2007    Past Surgical History:  Procedure Laterality Date   ABDOMINAL HYSTERECTOMY     ANTERIOR CERVICAL DISCECTOMY     APPENDECTOMY     CARPAL TUNNEL RELEASE  12/2011   right arm   CATARACT EXTRACTION     CORONARY ATHERECTOMY  07/23/2018   PTCA/orbital atherectomy/DES x 1 proximal to mid LAD   CORONARY ATHERECTOMY N/A 07/23/2018   Procedure: CORONARY ATHERECTOMY;  Surgeon: Burnell Blanks, MD;  Location: [facility];  Service: Cardiovascular;  Laterality: N/A;   CORONARY STENT INTERVENTION     CORONARY STENT INTERVENTION N/A 07/23/2018   Procedure: CORONARY STENT INTERVENTION;  Surgeon: Burnell Blanks, MD;  Location: [facility];  Service: Cardiovascular;  Laterality: N/A;   EYE SURGERY     INTRAMEDULLARY (IM) NAIL INTERTROCHANTERIC Right 09/19/2019   Procedure: INTRAMEDULLARY (IM) NAIL INTERTROCHANTRIC;  Surgeon: Renette Butters, MD;  Location: [facility];  Service: Orthopedics;  Laterality: Right;   LUMBAR LAMINECTOMY     right knee arthroscopy     right shoulder replacement  04/2009   right shoulder surgery  2008   Dr. Noemi Chapel   RIGHT/LEFT HEART CATH AND CORONARY ANGIOGRAPHY N/A 07/07/2018   Procedure: RIGHT/LEFT HEART CATH AND CORONARY  ANGIOGRAPHY;  Surgeon: Burnell Blanks, MD;  Location: [facility];  Service: Cardiovascular;  Laterality: N/A;   TEE WITHOUT CARDIOVERSION N/A 03/29/2019   Procedure: TRANSESOPHAGEAL ECHOCARDIOGRAM (TEE);  Surgeon: Burnell Blanks, MD;  Location: [facility];  Service: Open Heart Surgery;  Laterality: N/A;   TRANSCATHETER AORTIC VALVE REPLACEMENT, TRANSFEMORAL N/A 03/29/2019   Procedure: TRANSCATHETER AORTIC VALVE REPLACEMENT, TRANSFEMORAL;  Surgeon: Burnell Blanks, MD;  Location: [facility];  Service: Open Heart Surgery;  Laterality: N/A;   ULTRASOUND GUIDANCE FOR VASCULAR ACCESS  07/07/2018   Procedure: Ultrasound Guidance For Vascular Access;  Surgeon: Burnell Blanks, MD;  Location: [facility];  Service: Cardiovascular;;     OB History   No obstetric history on file.     Family History  Problem Relation Age of Onset   Other Mother        broken hip   Other Father        kidney problems   Colon cancer Neg Hx    Stomach cancer Neg Hx    Rectal cancer Neg Hx    Pancreatic cancer Neg Hx     Social History   Tobacco Use   Smoking status: Former    Packs/day: 2.00    Years: 30.00    Pack years: 60.00    Types: Cigarettes    Quit date: 10/28/1991    Years since quitting: 29.6   Smokeless tobacco: Never  Vaping Use   Vaping Use: Never used  Substance Use Topics   Alcohol use: Not Currently    Comment: 1 drink per night   Drug use: No    Home Medications Prior to Admission medications   Medication Sig Start Date End Date Taking? Authorizing Provider  albuterol (PROVENTIL) (2.5 MG/3ML) 0.083% nebulizer solution USE 1 VIAL IN NEBULIZER 6 TIMES DAILY - (for rescue) (Max 30 doses per month) 08/30/20   Kasa, Maretta Bees, MD  albuterol (VENTOLIN HFA) 108 (90 Base) MCG/ACT inhaler INHALE 2 PUFFS BY MOUTH EVERY 6 HOURS AS NEEDED FOR WHEEZING 10/05/20   Flora Lipps, MD  ALPRAZolam Duanne Moron) 0.5 MG tablet Take 1 tablet (0.5 mg total) by mouth 2 (two) times  daily as needed for anxiety. 07/01/21   Darliss Cheney, MD  AMBULATORY NON FORMULARY MEDICATION Medication Name: Incentive spirometer Use 10-15 times per day 12/14/18   Flora Lipps, MD  aspirin EC 81 MG tablet Take 1 tablet (81 mg total) by mouth daily. 04/01/19   Eileen Stanford, PA-C  BROVANA 15 MCG/2ML NEBU USE 1 VIAL  IN  NEBULIZER TWICE  DAILY - morning and evening 11/26/20   Flora Lipps, MD  budesonide (PULMICORT) 0.5 MG/2ML nebulizer solution USE 1 VIAL  IN  NEBULIZER TWICE  DAILY - Rinse mouth after treatment 11/26/20   Flora Lipps, MD  escitalopram (LEXAPRO) 20 MG tablet TAKE 1 TABLET BY MOUTH EVERY DAY 04/08/21   [name], [name], MD  fluticasone [facility]) 50 MCG/ACT nasal spray Place 2  sprays into both nostrils daily. 12/18/18   Ghimire, Henreitta Leber, MD  Respiratory Therapy Supplies (FLUTTER) DEVI 1 each by Does not apply route daily. 12/14/18   Flora Lipps, MD  senna-docusate (SENOKOT-S) 8.6-50 MG tablet Take 2 tablets by mouth 2 (two) times daily as needed for mild constipation or moderate constipation. 09/21/19   Mercy Riding, MD  vitamin B-12 (CYANOCOBALAMIN) 1000 MCG tablet Take 1,000 mcg by mouth daily.    [provider]    Allergies    Contrast media [iodinated diagnostic agents] and Iohexol  Review of Systems   Review of Systems  Physical Exam Updated Vital Signs BP 130/66 (BP Location: Right Arm)   Pulse 81   Temp 97.9 F (36.6 C) (Axillary)   Resp (!) 27   Ht 1.626 m ('5\' 4"'$ )   Wt 47.5 kg   SpO2 100%   BMI 17.97 kg/m   Physical Exam Vitals and nursing note reviewed.  Constitutional:      Appearance: She is well-developed.  HENT:     Head: Normocephalic.     Mouth/Throat:     Mouth: Mucous membranes are moist.  Eyes:     Pupils: Pupils are equal, round, and reactive to light.  Cardiovascular:     Rate and Rhythm: Normal rate and regular rhythm.  Pulmonary:     Effort: Tachypnea present.     Breath sounds: Examination of the right-lower field  reveals rhonchi. Examination of the left-lower field reveals rhonchi. Decreased breath sounds and rhonchi present.  Abdominal:     General: Bowel sounds are normal.     Palpations: Abdomen is soft.  Musculoskeletal:     Cervical back: Normal range of motion.  Neurological:     Mental Status: She is alert.    ED Results / Procedures / Treatments   Labs (all labs ordered are listed, but only abnormal results are displayed) Labs Reviewed - No data to display  EKG EKG Interpretation  Date/Time:  Tuesday July 02 2021 15:02:51 EDT Ventricular Rate:  86 PR Interval:  126 QRS Duration: 94 QT Interval:  384 QTC Calculation: 460 R Axis:   20 Text Interpretation: Sinus rhythm lvh with repolarization abnormality Anterior infarct, old No significant change since last tracing 21 June 2021 Confirmed by Pattricia Boss [phone]) on 07/02/2021 3:40:24 PM  Radiology DG Chest Port 1 View  Result Date: 07/02/2021 CLINICAL DATA:  Dyspnea.  Recent diagnosis of COVID pneumonia. EXAM: PORTABLE CHEST 1 VIEW COMPARISON:  06/25/2021 FINDINGS: Stable cardiomediastinal contours. There are patchy airspace densities within the left mid lung which appear new from the previous exam. Diffuse opacification of the retrocardiac left lower lung is also noted, similar to 06/25/2021. Visualized osseous structures are unremarkable. IMPRESSION: 1. New patchy airspace densities within the left mid lung. 2. Persistent retrocardiac left lower lung opacification which may reflect atelectasis and/or airspace disease. Electronically Signed   By: Kerby Moors M.D.   On: 07/02/2021 17:38    Procedures Procedures   Medications Ordered in ED Medications - No data to display  ED Course  I have reviewed the triage vital signs and the nursing notes.  Pertinent labs & imaging results that were available during my care of the patient were reviewed by me and considered in my medical decision making (see chart for  details).  Patient with recent covid infection just d/c'd today.  D-dimer at 1.41- age adjusted wnl    MDM Rules/Calculators/A&P  87 year old female discharged today after 10-day admission for COVID.  She has had some episodes of dyspnea, coughing, and desaturations.  Chest x-J[place]reel [place] here shows new patchy airspace densities within the left midlung. Cefepime and vanc ordered Discussed with Dr. Posey Pronto who will see for admission Final Clinical Impression(s) / ED Diagnoses Final diagnoses:  None   HCAP Rx / DC Orders ED Discharge Orders     None        Pattricia Boss, MD 07/02/21 2346

## 2021-07-02 NOTE — H&P (Signed)
History and Physical    [name] [id number] DOB: July 13, 1926 DOA: 07/02/2021  PCP: [name], [name], MD  Patient coming from: SNF  I have personally briefly reviewed patient's old medical records in [place]  Chief Complaint: Hypoxia  HPI: Jessica [name] is a 87 y.o. female with medical history significant for chronic debility, COPD, chronic hypoxic respiratory failure on 3 L supplemental O2 at home, severe oropharyngeal dysphagia with chronic aspiration, CAD, chronic diastolic CHF, severe aortic stenosis s/p TAVR who presented to the ED from SNF for evaluation of acute on chronic hypoxia.  Patient is unable to provide any history at time of admission and is otherwise obtained by EDP, chart review, and patient's daughter by phone.  Patient just admitted 06/21/2021 and discharged earlier this morning 07/02/2021.  She was admitted for sepsis and acute on chronic hypoxic respiratory failure due to COVID-19 and aspiration pneumonias in the setting of COPD.  She completed 5 days IV remdesivir, 10 days dexamethasone, and course of ceftriaxone and azithromycin.  She has remained on 2-3 L of supplemental oxygen over the last 6-7 days while in hospital.  She had extra day in hospital due to SNF policy of not excepting her back until 10 days past COVID test.  Patient was discharged to SNF earlier this morning and apparently on arrival she was reporting increased shortness of breath.  O2 saturation was reportedly 87-88% on her baseline 3 L and improved to 97% on 4 L O2 via [place].  On exam, patient is lying in bed with mouth wide open displaying increased respiratory effort.  She has eyes open but is not tracking, speaking, or interactive.  I had a long discussion with patient's daughter [name]) by phone regarding her prognosis.  Patient appears to be approaching end-of-life and I am not sure that she will survive this hospitalization.  So when she does confirm that patient is DNR.  I discussed  focusing on comfort and limiting unnecessary interventions that may delay the inevitable.  Daughter wants to consider continue antibiotics tonight.  She asked me to speak to her daughter-in-law, Liz Malady [id number]) who currently lives in [place] and has medical background.  I discussed with Marzetta Board that it appears [name] is approaching end-of-life and unlikely to survive hospitalization.  Marzetta Board agrees that we should focus on comfort measures and she will relay our conversation to [employer].  ED Course:  Initial vitals showed BP 130/66, pulse 81, RR 27, temp 97.9 F, SPO2 100% on 4 L supplemental O2 via [place].  Labs show D-dimer 1.41 (0.93 when age-adjusted) which has been trending down from labs during admission.  I-STAT ABG showed pH 7.475, PCO2 35, PO2 68.  Portable chest x-ray showed new patchy airspace density within the left midlung with persistent retrocardiac left lower lobe opacification which may reflect atelectasis and/or airspace disease.  Patient was given IV Solu-Medrol 125 mg, IV Ativan 0.5 mg, IV vancomycin and cefepime.  The hospitalist service was consulted to admit for further evaluation and management.  Review of Systems:  Unable to obtain full review of systems as patient is not interactive.  Past Medical History:  Diagnosis Date   Anxiety    congenital nystagmus    COPD (chronic obstructive pulmonary disease) (HCC)    Coronary artery disease    DJD (degenerative joint disease)    Fibromyalgia    Low back pain syndrome    Memory loss    Other and unspecified hyperlipidemia    Pancreatic lesion  Pneumonia    S/P TAVR (transcatheter aortic valve replacement)    26 mm Edwards Sapien 3 via the TF approach   Severe aortic stenosis    Venous insufficiency     Past Surgical History:  Procedure Laterality Date   ABDOMINAL HYSTERECTOMY     ANTERIOR CERVICAL DISCECTOMY     APPENDECTOMY     CARPAL TUNNEL RELEASE  12/2011   right arm   CATARACT EXTRACTION      CORONARY ATHERECTOMY  07/23/2018   PTCA/orbital atherectomy/DES x 1 proximal to mid LAD   CORONARY ATHERECTOMY N/A 07/23/2018   Procedure: CORONARY ATHERECTOMY;  Surgeon: Burnell Blanks, MD;  Location: [facility];  Service: Cardiovascular;  Laterality: N/A;   CORONARY STENT INTERVENTION     CORONARY STENT INTERVENTION N/A 07/23/2018   Procedure: CORONARY STENT INTERVENTION;  Surgeon: Burnell Blanks, MD;  Location: [facility];  Service: Cardiovascular;  Laterality: N/A;   EYE SURGERY     INTRAMEDULLARY (IM) NAIL INTERTROCHANTERIC Right 09/19/2019   Procedure: INTRAMEDULLARY (IM) NAIL INTERTROCHANTRIC;  Surgeon: Renette Butters, MD;  Location: [facility];  Service: Orthopedics;  Laterality: Right;   LUMBAR LAMINECTOMY     right knee arthroscopy     right shoulder replacement  04/2009   right shoulder surgery  2008   Dr. Noemi Chapel   RIGHT/LEFT HEART CATH AND CORONARY ANGIOGRAPHY N/A 07/07/2018   Procedure: RIGHT/LEFT HEART CATH AND CORONARY ANGIOGRAPHY;  Surgeon: Burnell Blanks, MD;  Location: [facility];  Service: Cardiovascular;  Laterality: N/A;   TEE WITHOUT CARDIOVERSION N/A 03/29/2019   Procedure: TRANSESOPHAGEAL ECHOCARDIOGRAM (TEE);  Surgeon: Burnell Blanks, MD;  Location: [facility];  Service: Open Heart Surgery;  Laterality: N/A;   TRANSCATHETER AORTIC VALVE REPLACEMENT, TRANSFEMORAL N/A 03/29/2019   Procedure: TRANSCATHETER AORTIC VALVE REPLACEMENT, TRANSFEMORAL;  Surgeon: Burnell Blanks, MD;  Location: [facility];  Service: Open Heart Surgery;  Laterality: N/A;   ULTRASOUND GUIDANCE FOR VASCULAR ACCESS  07/07/2018   Procedure: Ultrasound Guidance For Vascular Access;  Surgeon: Burnell Blanks, MD;  Location: [facility];  Service: Cardiovascular;;    Social History:  reports that she quit smoking about 29 years ago. Her smoking use included cigarettes. She has a 60.00 pack-year smoking history. She has never used smokeless  tobacco. She reports that she does not currently use alcohol. She reports that she does not use drugs.  Allergies  Allergen Reactions   Contrast Media [Iodinated Diagnostic Agents] Itching, Rash and Other (See Comments)    Hypotension, Skin turns red like a sunburn   Iohexol Itching, Rash and Other (See Comments)    Hypotension, Skin turns red like a sunburn    Family History  Problem Relation Age of Onset   Other Mother        broken hip   Other Father        kidney problems   Colon cancer Neg Hx    Stomach cancer Neg Hx    Rectal cancer Neg Hx    Pancreatic cancer Neg Hx      Prior to Admission medications   Medication Sig Start Date End Date Taking? Authorizing Provider  albuterol (PROVENTIL) (2.5 MG/3ML) 0.083% nebulizer solution USE 1 VIAL IN NEBULIZER 6 TIMES DAILY - (for rescue) (Max 30 doses per month) 08/30/20   Kasa, Maretta Bees, MD  albuterol (VENTOLIN HFA) 108 (90 Base) MCG/ACT inhaler INHALE 2 PUFFS BY MOUTH EVERY 6 HOURS AS NEEDED FOR WHEEZING 10/05/20  Flora Lipps, MD  ALPRAZolam Duanne Moron) 0.5 MG tablet Take 1 tablet (0.5 mg total) by mouth 2 (two) times daily as needed for anxiety. 07/01/21   Darliss Cheney, MD  AMBULATORY NON FORMULARY MEDICATION Medication Name: Incentive spirometer Use 10-15 times per day 12/14/18   Flora Lipps, MD  aspirin EC 81 MG tablet Take 1 tablet (81 mg total) by mouth daily. 04/01/19   Eileen Stanford, PA-C  BROVANA 15 MCG/2ML NEBU USE 1 VIAL  IN  NEBULIZER TWICE  DAILY - morning and evening 11/26/20   Flora Lipps, MD  budesonide (PULMICORT) 0.5 MG/2ML nebulizer solution USE 1 VIAL  IN  NEBULIZER TWICE  DAILY - Rinse mouth after treatment 11/26/20   Flora Lipps, MD  escitalopram (LEXAPRO) 20 MG tablet TAKE 1 TABLET BY MOUTH EVERY DAY 04/08/21   [name], [name], MD  fluticasone [facility]) 50 MCG/ACT nasal spray Place 2 sprays into both nostrils daily. 12/18/18   Ghimire, Henreitta Leber, MD  Respiratory Therapy Supplies (FLUTTER) DEVI 1 each by Does not  apply route daily. 12/14/18   Flora Lipps, MD  senna-docusate (SENOKOT-S) 8.6-50 MG tablet Take 2 tablets by mouth 2 (two) times daily as needed for mild constipation or moderate constipation. 09/21/19   Mercy Riding, MD  vitamin B-12 (CYANOCOBALAMIN) 1000 MCG tablet Take 1,000 mcg by mouth daily.    [provider]    Physical Exam: Vitals:   07/02/21 1745 07/02/21 1915 07/02/21 2000 07/02/21 2045  BP: 123/84 108/63 114/62 101/65  Pulse: 74 81 73 68  Resp: (!) '25 17 18 18  '$ Temp:      TempSrc:      SpO2: 98% 97% 100% 98%  Weight:      Height:      Exam limited due to decreased level of interaction Constitutional: Cachectic chronically ill-appearing elderly woman laying in bed with mouth wide open, eyes open but not tracking or interactive Eyes: PERRL, lids and conjunctivae normal ENMT: Mucous membranes are dry. Posterior pharynx clear of any exudate or lesions.edentulous Neck: normal, supple, no masses. Respiratory: clear to auscultation anteriorly Cardiovascular: Regular rate and rhythm, no murmurs / rubs / gallops. No extremity edema. 2+ pedal pulses. Abdomen: no tenderness, no masses palpated. No hepatosplenomegaly. Musculoskeletal: no clubbing / cyanosis. No joint deformity upper and lower extremities.  Muscle wasting throughout Skin: no rashes, lesions, ulcers. No induration Neurologic: Minimally interactive, withdraws to noxious stimuli Psychiatric: Minimally interactive, withdraws to noxious stimuli, not conversant, not following commands  Labs on Admission: I have personally reviewed following labs and imaging studies  CBC: Recent Labs  Lab 06/26/21 0327 07/01/21 0814 07/02/21 1802  WBC 3.2* 6.8  --   NEUTROABS 2.3 5.1  --   HGB 9.2* 11.6* 11.9*  HCT 27.6* 36.3 35.0*  MCV 92.6 95.3  --   PLT 344 418*  --    Basic Metabolic Panel: Recent Labs  Lab 06/26/21 0327 06/27/21 0958 07/01/21 0814 07/02/21 1802  NA 133* 134* 134* 138  K 4.5 3.7 4.2 4.7  CL  101 99 100  --   CO2 '27 27 27  '$ --   GLUCOSE 91 95 91  --   BUN 19 16 27*  --   CREATININE 0.56 0.60 0.66  --   CALCIUM 8.8* 8.8* 9.6  --   MG 1.6* 1.9 1.9  --    GFR: Estimated Creatinine Clearance: 32.9 mL/min (by C-G formula based on SCr of 0.66 mg/dL). Liver Function Tests: Recent Labs  Lab  06/26/21 0327  AST 37  ALT 28  ALKPHOS 74  BILITOT 0.6  PROT 5.5*  ALBUMIN 2.2*   No results for input(s): LIPASE, AMYLASE in the last 168 hours. No results for input(s): AMMONIA in the last 168 hours. Coagulation Profile: No results for input(s): INR, PROTIME in the last 168 hours. Cardiac Enzymes: No results for input(s): CKTOTAL, CKMB, CKMBINDEX, TROPONINI in the last 168 hours. BNP (last 3 results) No results for input(s): PROBNP in the last 8760 hours. HbA1C: No results for input(s): HGBA1C in the last 72 hours. CBG: No results for input(s): GLUCAP in the last 168 hours. Lipid Profile: No results for input(s): CHOL, HDL, LDLCALC, TRIG, CHOLHDL, LDLDIRECT in the last 72 hours. Thyroid Function Tests: No results for input(s): TSH, T4TOTAL, FREET4, T3FREE, THYROIDAB in the last 72 hours. Anemia Panel: No results for input(s): VITAMINB12, FOLATE, FERRITIN, TIBC, IRON, RETICCTPCT in the last 72 hours. Urine analysis:    Component Value Date/Time   COLORURINE AMBER (A) 06/21/2021 1610   APPEARANCEUR HAZY (A) 06/21/2021 1610   LABSPEC 1.029 06/21/2021 1610   PHURINE 6.0 06/21/2021 1610   GLUCOSEU NEGATIVE 06/21/2021 1610   GLUCOSEU NEGATIVE 07/06/2009 1057   HGBUR NEGATIVE 06/21/2021 1610   BILIRUBINUR NEGATIVE 06/21/2021 1610   BILIRUBINUR 3+ 01/16/2020 [place] 06/21/2021 1610   PROTEINUR NEGATIVE 06/21/2021 1610   UROBILINOGEN 2.0 (A) 01/16/2020 1448   UROBILINOGEN 0.2 07/06/2009 1057   NITRITE NEGATIVE 06/21/2021 1610   LEUKOCYTESUR NEGATIVE 06/21/2021 1610    Radiological Exams on Admission: DG Chest Port 1 View  Result Date: 07/02/2021 CLINICAL  DATA:  Dyspnea.  Recent diagnosis of COVID pneumonia. EXAM: PORTABLE CHEST 1 VIEW COMPARISON:  06/25/2021 FINDINGS: Stable cardiomediastinal contours. There are patchy airspace densities within the left mid lung which appear new from the previous exam. Diffuse opacification of the retrocardiac left lower lung is also noted, similar to 06/25/2021. Visualized osseous structures are unremarkable. IMPRESSION: 1. New patchy airspace densities within the left mid lung. 2. Persistent retrocardiac left lower lung opacification which may reflect atelectasis and/or airspace disease. Electronically Signed   By: Kerby Moors M.D.   On: 07/02/2021 17:38    EKG: Personally reviewed. Normal sinus rhythm without acute ischemic changes.  Tachycardia resolved when compared to prior.  Assessment/Plan Principal Problem:   Acute on chronic respiratory failure with hypoxia (HCC) Active Problems:   Generalized anxiety disorder   Chronic obstructive pulmonary disease (HCC)   Coronary artery disease involving native coronary artery of native heart without angina pectoris   Chronic diastolic CHF (congestive heart failure) (HCC)   Jessica [name] is a 87 y.o. female with medical history significant for chronic debility, COPD, chronic hypoxic respiratory failure on 3 L supplemental O2 at home, severe oropharyngeal dysphagia with chronic aspiration, CAD, chronic diastolic CHF, severe aortic stenosis s/p TAVR who is admitted with acute on chronic respiratory failure with hypoxia.  End-of-life care due to acute on chronic respiratory failure with hypoxia in setting of recent COVID-19 and aspiration pneumonias superimposed on COPD: Patient recent prolonged admission for sepsis and acute on chronic respiratory failure due to COVID-19 pneumonia, aspiration pneumonia in the setting of COPD.  Patient discharged to SNF and immediately return to ED for recurrent hypoxia.  She is lying in bed with eyes and mouth open with increased  respiratory effort and not interactive except for grimace with noxious stimuli.  I had long separate discussions with patient's daughter, [name], and [name]'s daughter-in-law Liz Malady regarding  patient's poor prognosis and that she appears to be actively dying and approaching end-of-life.  CODE STATUS is confirmed to be DNR.  Focus will maintain on comfort and we will transition to full comfort care measures and hold further antibiotics or unnecessary interventions.  Given the current situation I think it is appropriate to allow patient's daughter to visit with her while in hospital.  Severe oropharyngeal dysphagia with chronic aspiration: Chronic diastolic CHF CAD Severe aortic stenosis s/p TAVR Anxiety  DVT prophylaxis: None Code Status: DNR Family Communication: Discussed with patient's daughter, [name], and [name]'s daughter-in-law Electronics engineer by phone Disposition Plan: From SNF, anticipate in-hospital death Consults called: None Level of care: Med-Surg Admission status:  Status is: Inpatient  Remains inpatient appropriate because: Comfort care  Dispo: The patient is from: SNF              Anticipated d/c is to:  Anticipate in-hospital death              Patient currently is not medically stable to d/c.     Zada Finders MD [facility]  If 7PM-7AM, please contact night-coverage [email or web address]  07/02/2021, 10:03 PM

## 2021-07-02 NOTE — ED Triage Notes (Signed)
Dx with covid pneumonia on 8/26. DC back to nursing home recently. Sent here for worsening sob. 87-88% on 3LPM. Increased to 4LPM with 97% on the monitor. Oriented at baseline.

## 2021-07-02 NOTE — Plan of Care (Signed)
  Problem: Health Behavior/Discharge Planning: Goal: Ability to manage health-related needs will improve Outcome: Progressing   Problem: Clinical Measurements: Goal: Ability to maintain clinical measurements within normal limits will improve Outcome: Progressing Goal: Will remain free from infection Outcome: Progressing Goal: Diagnostic test results will improve Outcome: Progressing Goal: Respiratory complications will improve Outcome: Progressing Goal: Cardiovascular complication will be avoided Outcome: Progressing   Problem: Activity: Goal: Risk for activity intolerance will decrease Outcome: Progressing   Problem: Nutrition: Goal: Adequate nutrition will be maintained Outcome: Progressing   Problem: Coping: Goal: Level of anxiety will decrease Outcome: Progressing   Problem: Elimination: Goal: Will not experience complications related to bowel motility Outcome: Progressing Goal: Will not experience complications related to urinary retention Outcome: Progressing   Problem: Pain Managment: Goal: General experience of comfort will improve Outcome: Progressing   Problem: Safety: Goal: Ability to remain free from injury will improve Outcome: Progressing   Problem: Skin Integrity: Goal: Risk for impaired skin integrity will decrease Outcome: Progressing   Problem: Education: Goal: Knowledge of General Education information will improve Description: Including pain rating scale, medication(s)/side effects and non-pharmacologic comfort measures Outcome: Not Met (add Reason)

## 2021-07-02 NOTE — Progress Notes (Signed)
Pt had issues with swallowing meds. I would recommend a close watch while pt eats and drinks.

## 2021-07-02 NOTE — Progress Notes (Signed)
Report given to Caryl Pina RN at [facility].

## 2021-07-02 NOTE — ED Notes (Signed)
Waiting for pharmacy to send IV antibiotics.

## 2021-07-02 NOTE — Progress Notes (Signed)
Daughter, [name], called and updated about pt transfer to SNF.

## 2021-07-02 NOTE — Progress Notes (Addendum)
Pharmacy Antibiotic Note  Jessica [name] is a 87 y.o. female admitted on 07/02/2021 with COVID pneumonia.  Pharmacy has been consulted for vancomycin dosing. Patient presented in ED today (9/6) after being discharged this morning from recent admission (8/26-9/6) for sepsis secondary to aspiration pneumonia.   SCr - 0.66, at baseline Afebrile, WBC - 8.6  Plan: Cefepime 1gm x1 given in ED Vancomycin 1000 mg IV x1 Vancomycin 500 mg IV every 24 hours.  Goal trough 15-20 mcg/mL. (Goal AUC 400-550, estimated AUC 460.6, SCr used 0.8) Vancomycin levels when indicated Monitor renal function, clinical status, MRSA PCR for de-escalation  Height: '5\' 4"'$  (162.6 cm) Weight: 47.5 kg (104 lb 11.5 oz) IBW/kg (Calculated) : 54.7  Temp (24hrs), Avg:98.1 F (36.7 C), Min:97.9 F (36.6 C), Max:98.7 F (37.1 C)  Recent Labs  Lab 06/26/21 0327 06/27/21 0958 07/01/21 0814  WBC 3.2*  --  6.8  CREATININE 0.56 0.60 0.66    Estimated Creatinine Clearance: 32.9 mL/min (by C-G formula based on SCr of 0.66 mg/dL).    Allergies  Allergen Reactions   Contrast Media [Iodinated Diagnostic Agents] Itching, Rash and Other (See Comments)    Hypotension, Skin turns red like a sunburn   Iohexol Itching, Rash and Other (See Comments)    Hypotension, Skin turns red like a sunburn    Antimicrobials this admission: Patient was discharged this morning (9/6) - returned to ED 9/6 CTX 8/26 >> 8/30 Azithromycin 8/26 >> 8/30  Cefepime 1gm x1 given in ED 9/6 Vancomycin 9/6 >> 9/14  Dose adjustments this admission: N/A  Microbiology results: none  Thank you for allowing pharmacy to be a part of this patient's care.  Laurey Arrow, PharmD PGY1 Pharmacy Resident 07/02/2021  6:31 PM  Please check [email or web address] for unit-specific pharmacy phone numbers.

## 2021-07-03 DIAGNOSIS — I5032 Chronic diastolic (congestive) heart failure: Secondary | ICD-10-CM

## 2021-07-03 NOTE — Progress Notes (Addendum)
Manufacturing engineer [facility]) Hospital Liaison note.    4:10pm: Chart has been reviewed by [facility] MD: Pt deemed eligible for [facility].   Received request from [place] for family interest in [facility]. Chart and pt information under review by [facility] physician.  Hospice eligibility pending at this time.  [facility] is unable to offer a room today. Hospital Liaison will follow up tomorrow or sooner if a room becomes available. Please do not hesitate to call with questions.    Thank you for the opportunity to participate in this patient's care.  Domenic Moras, BSN, RN [facility] Liaison (listed on [place] under Hospice[facility])    [phone] (24h on call)

## 2021-07-03 NOTE — Consult Note (Signed)
   [facility] Inpatient Consult   07/03/2021  Jessica [name] [facility] 1926-06-25 [id number]  [facility] [AC[facility]] Patient: Medicare CMS DCE   Patient screened for less than 7 days readmission hospitalization with noted extreme high risk score for unplanned readmission risk.  Review of patient's medical record reveals patient is being followed by palliative care for hospice noted.   Plan:  Continue to follow progress and disposition to assess for post hospital care management needs.  Will sign off when appropriate.  For questions contact:   Natividad Brood, RN BSN [facility] Liaison  [phone] business mobile phone Toll free office [phone]  Fax number: [phone] [email or web address]

## 2021-07-03 NOTE — Progress Notes (Signed)
Nutrition Brief Note  Chart reviewed. Patient triggered Malnutrition Screening Tool (MST).  Pt now transitioning to comfort care.   No further nutrition interventions planned at this time.   Please re-consult as needed.   Derrel Nip, RD, LDN (she/her/hers) Registered Dietitian I After-Hours/Weekend Pager # in [place]

## 2021-07-03 NOTE — TOC Initial Note (Signed)
Transition of Care [facility]) - Initial/Assessment Note    Patient Details  Name: Jessica [name] MRN: [id number] Date of Birth: 25-Sep-1926  Transition of Care [facility]) CM/SW Contact:    Benard Halsted, LCSW Phone Number: 07/03/2021, 12:17 PM  Clinical Narrative:                 CSW received consult for hospice placement. CSW spoke with patient's daughter at bedside. She reported preference for [facility] since she lives very close to them. CSW provided supportive listening as she lamented the quick decline of patient and reassured her that staff will work to keep patient comfortable. CSW sent referral to [place] with [facility].   Expected Discharge Plan: [place] Barriers to Discharge: Hospice Bed not available   Patient Goals and CMS Choice Patient states their goals for this hospitalization and ongoing recovery are:: Comfort CMS Medicare.gov Compare Post Acute Care list provided to:: Patient Represenative (must comment) Choice offered to / list presented to : Adult Children  Expected Discharge Plan and Services Expected Discharge Plan: [place] In-house Referral: Clinical Social Work, Hospice / [place] Acute Care Choice: Hospice Living arrangements for the past 2 months: [place], [place]                                      Prior Living Arrangements/Services Living arrangements for the past 2 months: [place], [place] Lives with:: Adult Children Patient language and need for interpreter reviewed:: Yes        Need for Family Participation in Patient Care: Yes (Comment) Care giver support system in place?: Yes (comment)   Criminal Activity/Legal Involvement Pertinent to Current Situation/Hospitalization: No - Comment as needed  Activities of Daily Living      Permission Sought/Granted Permission sought to share information with : Facility Sport and exercise psychologist,  Family Supports Permission granted to share information with : Yes, Verbal Permission Granted  Share Information with NAME: [name]  Permission granted to share info w AGENCY: Hospice  Permission granted to share info w Relationship: Daughter  Permission granted to share info w Contact Information: [phone]  Emotional Assessment   Attitude/Demeanor/Rapport: Unable to Assess Affect (typically observed): Unable to Assess Orientation: :  (unable to assess) Alcohol / Substance Use: Not Applicable Psych Involvement: No (comment)  Admission diagnosis:  Acute on chronic respiratory failure with hypoxia (HCC) [J96.21] Patient Active Problem List   Diagnosis Date Noted   COVID-19 virus infection 06/21/2021   Aspiration pneumonia of right lung due to gastric secretions ([place]) 0000000   Acute metabolic encephalopathy 0000000   Lactic acidosis 06/21/2021   Sepsis with organ dysfunction ([place]) 06/21/2021   Dysphagia 06/21/2021   Pressure injury of skin 09/19/2019   Acute right hip pain 09/17/2019   S/P TAVR (transcatheter aortic valve replacement)    Acute on chronic respiratory failure with hypoxia ([place]) 12/15/2018   Depression 12/15/2018   Coronary artery disease involving native coronary artery of native heart without angina pectoris 12/15/2018   Chronic diastolic CHF (congestive heart failure) ([place]) 12/15/2018   History of pulmonary embolism 08/25/2018   Severe aortic stenosis    Aortic atherosclerosis ([place]) 05/20/2018   Insomnia 04/08/2016   Chronic obstructive pulmonary disease ([place]) 05/07/2015   Labial cyst 05/07/2015   Exertional dyspnea 02/26/2015   Vitamin D deficiency 01/14/2009   Mixed hyperlipidemia 01/14/2009  DEGENERATIVE JOINT DISEASE 06/12/2008   Fibromyalgia 12/15/2007   Generalized anxiety disorder 12/14/2007   PCP:  [name], [name], MD Pharmacy:   [facility] [phone] [place], [place] [id number] [name] [id number] Phone:  [phone] Fax: [phone]  [facility] [address] [name] [address] Phone: [phone] Fax: [phone]  [place], [place] [place] [address] Phone: [phone] Fax: [phone]     Social Determinants of Health (SDOH) Interventions    Readmission Risk Interventions Readmission Risk Prevention Plan 04/01/2019  Post Dischage Appt Complete  Medication Screening Complete  Transportation Screening Complete  Some recent data might be hidden

## 2021-07-03 NOTE — Progress Notes (Addendum)
PROGRESS NOTE        PATIENT DETAILS Name: Jessica [name] Age: 87 y.o. Sex: female Date of Birth: 1926/06/12 Admit Date: 07/02/2021 Admitting Physician Lenore Cordia, MD [id number], Malka So, MD  Brief Narrative: Patient is a 87 y.o. female dysphagia with history of chronic aspiration, COPD on 3 L of oxygen at home, HFpEF, severe aortic stenosis-s/p TAVR, chronic debility-was just discharged from the hospital on 9/6 to SNF after being treated for sepsis/COVID-pneumonia/aspiration pneumonia-brought back to the hospital for worsening hypoxemia-thought to be due to aspiration pneumonitis.  Significant events: 8/26-9/6>> hospitalization for COVID-pneumonia/aspiration pneumonia discharged to SNF. 9/6>> brought back to the ED from SNF due to worsening hypoxemia.  After discussion with family by admitting MD-transitioned to full comfort measures.  Significant studies: 9/6>> CXR: New patchy airspace opacity in the left midlung.  Antimicrobial therapy: None  Microbiology data: None  Procedures : None  Consults: None  DVT Prophylaxis : Not needed as comfort measures in effect.   Subjective: Barely arousable-gurgling and accumulating secretions.  Assessment/Plan: Acute on chronic hypoxic respiratory failure due to worsening aspiration pneumonitis: Full comfort measures in effect-I have asked social work to see if patient can be transferred to [place].  She is barely arousable this morning-and is accumulating secretions.  Severe oropharyngeal dysphagia: Okay for comfort feeds  History of COPD with chronic hypoxic respiratory failure  HFpEF: Euvolemic  Severe aortic stenosis-s/p TAVR  CAD: No anginal symptoms  Palliative care: DNR in place-admitting MD spoke with family-has been transitioned to full comfort measures.  This MD spoke with daughter at bedside-explained that patient was clearly at end-of-life-and that plans were for possible transfer  to residential hospice when bed available.  Continue as needed narcotics/Ativan-she seems comfortable.  Estimated body mass index is 14.49 kg/m as calculated from the following:   Height as of this encounter: '5\' 4"'$  (1.626 m).   Weight as of this encounter: 38.3 kg.     Diet: Diet Order     None        Code Status: DNR  Family Communication: Daughter at bedside.  Disposition Plan: Status is: Inpatient  Remains inpatient appropriate because:Inpatient level of care appropriate due to severity of illness  Dispo: The patient is from: Home              Anticipated d/c is to: Home              Patient currently is not medically stable to d/c.   Difficult to place patient No   Barriers to Discharge: Awaiting residential hospice bed.  Antimicrobial agents: Anti-infectives (From admission, onward)    Start     Dose/Rate Route Frequency Ordered Stop   07/03/21 1800  vancomycin (VANCOREADY) IVPB 500 mg/100 mL  Status:  Discontinued        500 mg 100 mL/hr over 60 Minutes Intravenous Every 24 hours 07/02/21 1837 07/02/21 2200   07/03/21 0500  ceFEPIme (MAXIPIME) 2 g in sodium chloride 0.9 % 100 mL IVPB  Status:  Discontinued        2 g 200 mL/hr over 30 Minutes Intravenous Every 12 hours 07/02/21 2136 07/02/21 2200   07/03/21 0200  ceFEPIme (MAXIPIME) 1 g in sodium chloride 0.9 % 100 mL IVPB  Status:  Discontinued        1 g 200 mL/hr over 30 Minutes  Intravenous Every 8 hours 07/02/21 2132 07/02/21 2135   07/02/21 1830  ceFEPIme (MAXIPIME) 1 g in sodium chloride 0.9 % 100 mL IVPB        1 g 200 mL/hr over 30 Minutes Intravenous  Once 07/02/21 1816 07/02/21 2014   07/02/21 1830  vancomycin (VANCOCIN) IVPB 1000 mg/200 mL premix        1,000 mg 200 mL/hr over 60 Minutes Intravenous  Once 07/02/21 1828 07/02/21 2106        Time spent: 35 minutes-Greater than 50% of this time was spent in counseling, explanation of diagnosis, planning of further management, and coordination  of care.  MEDICATIONS: Scheduled Meds: Continuous Infusions: PRN Meds:.acetaminophen **OR** acetaminophen, antiseptic oral rinse, glycopyrrolate **OR** glycopyrrolate **OR** glycopyrrolate, haloperidol **OR** haloperidol **OR** haloperidol lactate, LORazepam **OR** LORazepam **OR** LORazepam, morphine injection, morphine CONCENTRATE **OR** morphine CONCENTRATE, ondansetron **OR** ondansetron (ZOFRAN) IV, polyvinyl alcohol   PHYSICAL EXAM: Vital signs: Vitals:   07/02/21 2215 07/02/21 2300 07/03/21 0008 07/03/21 0900  BP: (!) 96/56 105/69 (!) 98/59   Pulse: 70 73 65 65  Resp: 20 (!) 22 20   Temp:   98.1 F (36.7 C)   TempSrc:   Axillary   SpO2: 97% 95% 93% 100%  Weight:   38.3 kg   Height:       Filed Weights   07/02/21 1504 07/03/21 0008  Weight: 47.5 kg 38.3 kg   Body mass index is 14.49 kg/m.   Gen Exam: Barely arousable-appears comfortable. HEENT:atraumatic, normocephalic Chest: Some transmitted upper airway sounds. CVS:S1S2 regular Abdomen:soft non tender, non distended Extremities:no edema Neurology: Unable to evaluate. Skin: no rash  I have personally reviewed following labs and imaging studies  LABORATORY DATA: CBC: Recent Labs  Lab 07/01/21 0814 07/02/21 1802  WBC 6.8  --   NEUTROABS 5.1  --   HGB 11.6* 11.9*  HCT 36.3 35.0*  MCV 95.3  --   PLT 418*  --     Basic Metabolic Panel: Recent Labs  Lab 06/27/21 0958 07/01/21 0814 07/02/21 1802  NA 134* 134* 138  K 3.7 4.2 4.7  CL 99 100  --   CO2 27 27  --   GLUCOSE 95 91  --   BUN 16 27*  --   CREATININE 0.60 0.66  --   CALCIUM 8.8* 9.6  --   MG 1.9 1.9  --     GFR: Estimated Creatinine Clearance: 26.6 mL/min (by C-G formula based on SCr of 0.66 mg/dL).  Liver Function Tests: No results for input(s): AST, ALT, ALKPHOS, BILITOT, PROT, ALBUMIN in the last 168 hours. No results for input(s): LIPASE, AMYLASE in the last 168 hours. No results for input(s): AMMONIA in the last 168  hours.  Coagulation Profile: No results for input(s): INR, PROTIME in the last 168 hours.  Cardiac Enzymes: No results for input(s): CKTOTAL, CKMB, CKMBINDEX, TROPONINI in the last 168 hours.  BNP (last 3 results) No results for input(s): PROBNP in the last 8760 hours.  Lipid Profile: No results for input(s): CHOL, HDL, LDLCALC, TRIG, CHOLHDL, LDLDIRECT in the last 72 hours.  Thyroid Function Tests: No results for input(s): TSH, T4TOTAL, FREET4, T3FREE, THYROIDAB in the last 72 hours.  Anemia Panel: No results for input(s): VITAMINB12, FOLATE, FERRITIN, TIBC, IRON, RETICCTPCT in the last 72 hours.  Urine analysis:    Component Value Date/Time   COLORURINE AMBER (A) 06/21/2021 1610   APPEARANCEUR HAZY (A) 06/21/2021 1610   LABSPEC 1.029 06/21/2021 1610   PHURINE  6.0 06/21/2021 1610   GLUCOSEU NEGATIVE 06/21/2021 1610   GLUCOSEU NEGATIVE 07/06/2009 1057   HGBUR NEGATIVE 06/21/2021 [place] 06/21/2021 1610   BILIRUBINUR 3+ 01/16/2020 [place] 06/21/2021 1610   PROTEINUR NEGATIVE 06/21/2021 1610   UROBILINOGEN 2.0 (A) 01/16/2020 1448   UROBILINOGEN 0.2 07/06/2009 1057   NITRITE NEGATIVE 06/21/2021 1610   LEUKOCYTESUR NEGATIVE 06/21/2021 1610    Sepsis Labs: Lactic Acid, Venous    Component Value Date/Time   LATICACIDVEN 1.6 06/21/2021 1810    MICROBIOLOGY: No results found for this or any previous visit (from the past 240 hour(s)).  RADIOLOGY STUDIES/RESULTS: DG Chest Port 1 View  Result Date: 07/02/2021 CLINICAL DATA:  Dyspnea.  Recent diagnosis of COVID pneumonia. EXAM: PORTABLE CHEST 1 VIEW COMPARISON:  06/25/2021 FINDINGS: Stable cardiomediastinal contours. There are patchy airspace densities within the left mid lung which appear new from the previous exam. Diffuse opacification of the retrocardiac left lower lung is also noted, similar to 06/25/2021. Visualized osseous structures are unremarkable. IMPRESSION: 1. New patchy  airspace densities within the left mid lung. 2. Persistent retrocardiac left lower lung opacification which may reflect atelectasis and/or airspace disease. Electronically Signed   By: Kerby Moors M.D.   On: 07/02/2021 17:38     LOS: 1 day   Oren Binet, MD  [facility]    To contact the attending provider between 7A-7P or the covering provider during after hours 7P-7A, please log into the web site [email or web address] and access using universal [place] password for that web site. If you do not have the password, please call the hospital operator.  07/03/2021, 12:01 PM

## 2021-07-04 ENCOUNTER — Ambulatory Visit: Payer: Medicare Other | Admitting: Internal Medicine

## 2021-07-04 NOTE — Progress Notes (Signed)
[facility] ([facility])  There is not a bed at [facility] today.  Updated TOC and dtr.  [facility] will update once bed status changes.  Thank you, Venia Carbon RN, BSN, [facility] Liaison

## 2021-07-04 NOTE — Progress Notes (Signed)
PROGRESS NOTE        PATIENT DETAILS Name: Jessica [name] Age: 87 y.o. Sex: female Date of Birth: 10-02-1926 Admit Date: 07/02/2021 Admitting Physician Lenore Cordia, MD [id number], Malka So, MD  Brief Narrative: Patient is a 87 y.o. female dysphagia with history of chronic aspiration, COPD on 3 L of oxygen at home, HFpEF, severe aortic stenosis-s/p TAVR, chronic debility-was just discharged from the hospital on 9/6 to SNF after being treated for sepsis/COVID-pneumonia/aspiration pneumonia-brought back to the hospital for worsening hypoxemia-thought to be due to aspiration pneumonitis.  Significant events: 8/26-9/6>> hospitalization for COVID-pneumonia/aspiration pneumonia discharged to SNF. 9/6>> brought back to the ED from SNF due to worsening hypoxemia.  After discussion with family by admitting MD-transitioned to full comfort measures.  Significant studies: 9/6>> CXR: New patchy airspace opacity in the left midlung.  Antimicrobial therapy: None  Microbiology data: None  Procedures : None  Consults: None  DVT Prophylaxis : Not needed as comfort measures in effect.   Subjective: Confused-was very short of breath this morning.  Given Ativan for comfort.  Assessment/Plan: Acute on chronic hypoxic respiratory failure due to worsening aspiration pneumonitis: Continue full comfort measures-awaiting residential hospice bed.  Severe oropharyngeal dysphagia: Okay for comfort feeds  History of COPD with chronic hypoxic respiratory failure  HFpEF: Euvolemic  Severe aortic stenosis-s/p TAVR  CAD: No anginal symptoms  Palliative care: DNR in place-admitting MD spoke with family-has been transitioned to full comfort measures.  This MD spoke with daughter at bedside on 9/7-explained that patient was clearly at end-of-life-and that plans were for possible transfer to residential hospice when bed available.  Continue as needed narcotics/Ativan-she  seems comfortable.  Estimated body mass index is 14.49 kg/m as calculated from the following:   Height as of this encounter: '5\' 4"'$  (1.626 m).   Weight as of this encounter: 38.3 kg.     Diet: Diet Order     None        Code Status: DNR  Family Communication: Daughter at bedside.  Disposition Plan: Status is: Inpatient  Remains inpatient appropriate because:Inpatient level of care appropriate due to severity of illness  Dispo: The patient is from: Home              Anticipated d/c is to: Home              Patient currently is not medically stable to d/c.   Difficult to place patient No   Barriers to Discharge: Awaiting residential hospice bed.  Antimicrobial agents: Anti-infectives (From admission, onward)    Start     Dose/Rate Route Frequency Ordered Stop   07/03/21 1800  vancomycin (VANCOREADY) IVPB 500 mg/100 mL  Status:  Discontinued        500 mg 100 mL/hr over 60 Minutes Intravenous Every 24 hours 07/02/21 1837 07/02/21 2200   07/03/21 0500  ceFEPIme (MAXIPIME) 2 g in sodium chloride 0.9 % 100 mL IVPB  Status:  Discontinued        2 g 200 mL/hr over 30 Minutes Intravenous Every 12 hours 07/02/21 2136 07/02/21 2200   07/03/21 0200  ceFEPIme (MAXIPIME) 1 g in sodium chloride 0.9 % 100 mL IVPB  Status:  Discontinued        1 g 200 mL/hr over 30 Minutes Intravenous Every 8 hours 07/02/21 2132 07/02/21 2135   07/02/21 1830  ceFEPIme (MAXIPIME) 1 g in sodium chloride 0.9 % 100 mL IVPB        1 g 200 mL/hr over 30 Minutes Intravenous  Once 07/02/21 1816 07/02/21 2014   07/02/21 1830  vancomycin (VANCOCIN) IVPB 1000 mg/200 mL premix        1,000 mg 200 mL/hr over 60 Minutes Intravenous  Once 07/02/21 1828 07/02/21 2106        Time spent: 15 minutes-Greater than 50% of this time was spent in counseling, explanation of diagnosis, planning of further management, and coordination of care.  MEDICATIONS: Scheduled Meds: Continuous Infusions: PRN  Meds:.acetaminophen **OR** acetaminophen, antiseptic oral rinse, glycopyrrolate **OR** glycopyrrolate **OR** glycopyrrolate, haloperidol **OR** haloperidol **OR** haloperidol lactate, LORazepam **OR** LORazepam **OR** LORazepam, morphine injection, morphine CONCENTRATE **OR** morphine CONCENTRATE, ondansetron **OR** ondansetron (ZOFRAN) IV, polyvinyl alcohol   PHYSICAL EXAM: Vital signs: Vitals:   07/02/21 2300 07/03/21 0008 07/03/21 0900 07/04/21 1000  BP: 105/69 (!) 98/59  110/67  Pulse: 73 65 65 69  Resp: (!) '22 20  20  '$ Temp:  98.1 F (36.7 C)  98.5 F (36.9 C)  TempSrc:  Axillary  Axillary  SpO2: 95% 93% 100%   Weight:  38.3 kg    Height:       Filed Weights   07/02/21 1504 07/03/21 0008  Weight: 47.5 kg 38.3 kg   Body mass index is 14.49 kg/m.   Appears comfortable-not in any distress..  I have personally reviewed following labs and imaging studies  LABORATORY DATA: CBC: Recent Labs  Lab 07/01/21 0814 07/02/21 1802  WBC 6.8  --   NEUTROABS 5.1  --   HGB 11.6* 11.9*  HCT 36.3 35.0*  MCV 95.3  --   PLT 418*  --      Basic Metabolic Panel: Recent Labs  Lab 07/01/21 0814 07/02/21 1802  NA 134* 138  K 4.2 4.7  CL 100  --   CO2 27  --   GLUCOSE 91  --   BUN 27*  --   CREATININE 0.66  --   CALCIUM 9.6  --   MG 1.9  --      GFR: Estimated Creatinine Clearance: 26.6 mL/min (by C-G formula based on SCr of 0.66 mg/dL).  Liver Function Tests: No results for input(s): AST, ALT, ALKPHOS, BILITOT, PROT, ALBUMIN in the last 168 hours. No results for input(s): LIPASE, AMYLASE in the last 168 hours. No results for input(s): AMMONIA in the last 168 hours.  Coagulation Profile: No results for input(s): INR, PROTIME in the last 168 hours.  Cardiac Enzymes: No results for input(s): CKTOTAL, CKMB, CKMBINDEX, TROPONINI in the last 168 hours.  BNP (last 3 results) No results for input(s): PROBNP in the last 8760 hours.  Lipid Profile: No results for  input(s): CHOL, HDL, LDLCALC, TRIG, CHOLHDL, LDLDIRECT in the last 72 hours.  Thyroid Function Tests: No results for input(s): TSH, T4TOTAL, FREET4, T3FREE, THYROIDAB in the last 72 hours.  Anemia Panel: No results for input(s): VITAMINB12, FOLATE, FERRITIN, TIBC, IRON, RETICCTPCT in the last 72 hours.  Urine analysis:    Component Value Date/Time   COLORURINE AMBER (A) 06/21/2021 1610   APPEARANCEUR HAZY (A) 06/21/2021 1610   LABSPEC 1.029 06/21/2021 1610   PHURINE 6.0 06/21/2021 1610   GLUCOSEU NEGATIVE 06/21/2021 1610   GLUCOSEU NEGATIVE 07/06/2009 1057   HGBUR NEGATIVE 06/21/2021 1610   BILIRUBINUR NEGATIVE 06/21/2021 1610   BILIRUBINUR 3+ 01/16/2020 [place] 06/21/2021 1610   PROTEINUR NEGATIVE 06/21/2021  1610   UROBILINOGEN 2.0 (A) 01/16/2020 1448   UROBILINOGEN 0.2 07/06/2009 1057   NITRITE NEGATIVE 06/21/2021 1610   LEUKOCYTESUR NEGATIVE 06/21/2021 1610    Sepsis Labs: Lactic Acid, Venous    Component Value Date/Time   LATICACIDVEN 1.6 06/21/2021 1810    MICROBIOLOGY: No results found for this or any previous visit (from the past 240 hour(s)).  RADIOLOGY STUDIES/RESULTS: DG Chest Port 1 View  Result Date: 07/02/2021 CLINICAL DATA:  Dyspnea.  Recent diagnosis of COVID pneumonia. EXAM: PORTABLE CHEST 1 VIEW COMPARISON:  06/25/2021 FINDINGS: Stable cardiomediastinal contours. There are patchy airspace densities within the left mid lung which appear new from the previous exam. Diffuse opacification of the retrocardiac left lower lung is also noted, similar to 06/25/2021. Visualized osseous structures are unremarkable. IMPRESSION: 1. New patchy airspace densities within the left mid lung. 2. Persistent retrocardiac left lower lung opacification which may reflect atelectasis and/or airspace disease. Electronically Signed   By: Kerby Moors M.D.   On: 07/02/2021 17:38     LOS: 2 days   Oren Binet, MD  [facility]    To contact the  attending provider between 7A-7P or the covering provider during after hours 7P-7A, please log into the web site [email or web address] and access using universal [place] password for that web site. If you do not have the password, please call the hospital operator.  07/04/2021, 1:56 PM

## 2021-07-05 ENCOUNTER — Other Ambulatory Visit: Payer: Self-pay | Admitting: Family Medicine

## 2021-07-05 DIAGNOSIS — J449 Chronic obstructive pulmonary disease, unspecified: Secondary | ICD-10-CM

## 2021-07-05 DIAGNOSIS — F411 Generalized anxiety disorder: Secondary | ICD-10-CM

## 2021-07-05 MED ORDER — GLYCOPYRROLATE 1 MG PO TABS: 1.0000 mg | ORAL_TABLET | ORAL | Status: AC | PRN

## 2021-07-05 MED ORDER — LORAZEPAM 1 MG PO TABS: 1.0000 mg | ORAL_TABLET | ORAL | 0 refills | Status: AC | PRN

## 2021-07-05 MED ORDER — MORPHINE SULFATE (CONCENTRATE) 10 MG/0.5ML PO SOLN: 5.0000 mg | ORAL | 0 refills | Status: AC | PRN

## 2021-07-05 NOTE — Progress Notes (Signed)
Manufacturing engineer [facility])  [facility] has a bed for Jessica [name] today and her dtr would like to proceed with transporting her.  Consents have to be completed before transport can be arranged. I will update TOC once consents are completed.  RN staff, please call report to [phone], bed is assigned when report is called.  Venia Carbon RN, BSN, [facility] Liaison

## 2021-07-05 NOTE — Discharge Summary (Signed)
PATIENT DETAILS Name: Jessica [name] Age: 87 y.o. Sex: female Date of Birth: February 18, 1926 MRN: [id number]. Admitting Physician: Lenore Cordia, MD [id number], Malka So, MD  Admit Date: 07/02/2021 Discharge date: 07/05/2021  Recommendations for Outpatient Follow-up:  Optimize comfort measures.  Admitted From:  Home  Disposition: [place]: No  Equipment/Devices: None  Discharge Condition: Stable  CODE STATUS: DNR  Diet recommendation:  Diet Order             Diet - low sodium heart healthy                    Brief Narrative: Patient is a 87 y.o. female dysphagia with history of chronic aspiration, COPD on 3 L of oxygen at home, HFpEF, severe aortic stenosis-s/p TAVR, chronic debility-was just discharged from the hospital on 9/6 to SNF after being treated for sepsis/COVID-pneumonia/aspiration pneumonia-brought back to the hospital for worsening hypoxemia-thought to be due to aspiration pneumonitis.   Significant events: 8/26-9/6>> hospitalization for COVID-pneumonia/aspiration pneumonia discharged to SNF. 9/6>> brought back to the ED from SNF due to worsening hypoxemia.  After discussion with family by admitting MD-transitioned to full comfort measures.   Significant studies: 9/6>> CXR: New patchy airspace opacity in the left midlung.   Antimicrobial therapy: None   Microbiology data: None   Procedures : None   Consults: None    Brief Hospital Course: Acute on chronic hypoxic respiratory failure due to worsening aspiration pneumonitis: Continue full comfort measures-awaiting residential hospice bed.   Severe oropharyngeal dysphagia: Okay for comfort feeds   History of COPD with chronic hypoxic respiratory failure   HFpEF: Euvolemic   Severe aortic stenosis-s/p TAVR   CAD: No anginal symptoms   Palliative care: DNR in place-admitting MD spoke with family-has been transitioned to full comfort measures.  This MD spoke  with daughter at bedside on 9/7-explained that patient was clearly at end-of-life-and that plans were for possible transfer to residential hospice when bed available.  Continue as needed narcotics/Ativan-she seems comfortable.   RN pressure injury documentation: Pressure Injury 09/18/19 Coccyx Mid;Lower Stage I -  Intact skin with non-blanchable redness of a localized area usually over a bony prominence. (Active)  09/18/19 0940  Location: Coccyx  Location Orientation: Mid;Lower  Staging: Stage I -  Intact skin with non-blanchable redness of a localized area usually over a bony prominence.  Wound Description (Comments):   Present on Admission:     Procedures None  Discharge Diagnoses:  Principal Problem:   Acute on chronic respiratory failure with hypoxia (HCC) Active Problems:   Generalized anxiety disorder   Chronic obstructive pulmonary disease (HCC)   Coronary artery disease involving native coronary artery of native heart without angina pectoris   Chronic diastolic CHF (congestive heart failure) ([place])   Discharge Instructions:  Activity:  As tolerated   Discharge Instructions     Diet - low sodium heart healthy   Complete by: As directed    Increase activity slowly   Complete by: As directed       Allergies as of 07/05/2021       Reactions   Contrast Media [iodinated Diagnostic Agents] Itching, Rash, Other (See Comments)   Hypotension, Skin turns red like a sunburn   Iohexol Itching, Rash, Other (See Comments)   Hypotension, Skin turns red like a sunburn        Medication List     STOP taking these medications    albuterol (2.5 MG/3ML)  0.083% nebulizer solution Commonly known as: PROVENTIL   albuterol 108 (90 Base) MCG/ACT inhaler Commonly known as: VENTOLIN HFA   ALPRAZolam 0.5 MG tablet Commonly known as: XANAX   AMBULATORY NON FORMULARY MEDICATION   aspirin EC 81 MG tablet   Brovana 15 MCG/2ML Nebu Generic drug: arformoterol   budesonide  0.5 MG/2ML nebulizer solution Commonly known as: PULMICORT   escitalopram 20 MG tablet Commonly known as: LEXAPRO   fluticasone 50 MCG/ACT nasal spray Commonly known as: FLONASE   Flutter Devi   senna-docusate 8.6-50 MG tablet Commonly known as: Senokot-S   vitamin B-12 1000 MCG tablet Commonly known as: CYANOCOBALAMIN       TAKE these medications    glycopyrrolate 1 MG tablet Commonly known as: ROBINUL Take 1 tablet (1 mg total) by mouth every 4 (four) hours as needed (excessive secretions).   LORazepam 1 MG tablet Commonly known as: ATIVAN Take 1 tablet (1 mg total) by mouth every 4 (four) hours as needed for anxiety.   morphine CONCENTRATE 10 MG/0.5ML Soln concentrated solution Take 0.25 mLs (5 mg total) by mouth every 2 (two) hours as needed for moderate pain (or dyspnea).        Follow-up Information     [name], [name], MD Follow up.   Specialty: Family Medicine Why: As needed Contact information: [name] [address] [phone]         [name], MD .   Specialty: Cardiology Contact information: [address] [place] [address] [phone]                Allergies  Allergen Reactions   Contrast Media [Iodinated Diagnostic Agents] Itching, Rash and Other (See Comments)    Hypotension, Skin turns red like a sunburn   Iohexol Itching, Rash and Other (See Comments)    Hypotension, Skin turns red like a sunburn      Consultations:  None   Other Procedures/Studies: DG Chest Port 1 View  Result Date: 07/02/2021 CLINICAL DATA:  Dyspnea.  Recent diagnosis of COVID pneumonia. EXAM: PORTABLE CHEST 1 VIEW COMPARISON:  06/25/2021 FINDINGS: Stable cardiomediastinal contours. There are patchy airspace densities within the left mid lung which appear new from the previous exam. Diffuse opacification of the retrocardiac left lower lung is also noted, similar to 06/25/2021. Visualized osseous  structures are unremarkable. IMPRESSION: 1. New patchy airspace densities within the left mid lung. 2. Persistent retrocardiac left lower lung opacification which may reflect atelectasis and/or airspace disease. Electronically Signed   By: Kerby Moors M.D.   On: 07/02/2021 17:38   DG CHEST PORT 1 VIEW  Result Date: 06/25/2021 CLINICAL DATA:  COVID-19 positivity with respiratory failure EXAM: PORTABLE CHEST 1 VIEW COMPARISON:  06/21/2021 FINDINGS: Cardiac shadow is stable. Changes of prior TAVR are seen. Aortic calcifications are again noted. Some increase in the degree of left retrocardiac density is seen when compare with the prior exam. New small pleural effusions are noted bilaterally. No other focal infiltrate is seen. Skin fold is noted over the left chest. Postsurgical changes in the right shoulder are noted. IMPRESSION: New bilateral small effusions.  Mild left retrocardiac opacity. Electronically Signed   By: Inez Catalina M.D.   On: 06/25/2021 08:43   DG Chest Port 1 View  Result Date: 06/21/2021 CLINICAL DATA:  87 year old female with questionable sepsis EXAM: PORTABLE CHEST 1 VIEW COMPARISON:  02/03/2021 FINDINGS: Cardiomediastinal silhouette unchanged in size and contour. No evidence of central vascular congestion.  No interlobular septal thickening. Changes of prior TAVR again noted. Increased reticulonodular opacities of the right lung. No pneumothorax or pleural effusion. No pneumothorax or pleural effusion. No acute displaced fracture. Surgical changes of the right glenohumeral joint. Chronic deformity of the right clavicle. IMPRESSION: Increased right-sided reticulonodular opacities, concerning for multifocal infection or possibly aspiration. Surgical changes of prior TAVR. Electronically Signed   By: Corrie Mckusick D.O.   On: 06/21/2021 16:37   DG Swallowing Func-Speech Pathology  Result Date: 06/26/2021 Table formatting from the original result was not included. Objective Swallowing  Evaluation: Type of Study: Bedside Swallow Evaluation  Patient Details Name: SWANZETTA STEPHEN MRN: [id number] Date of Birth: [birth year] Today's Date: 06/26/2021 Time: SLP Start Time (ACUTE ONLY): 64 -SLP Stop Time (ACUTE ONLY): 1500 SLP Time Calculation (min) (ACUTE ONLY): 25 min Past Medical History: Past Medical History: Diagnosis Date  Anxiety   congenital nystagmus   COPD (chronic obstructive pulmonary disease) (HCC)   Coronary artery disease   DJD (degenerative joint disease)   Fibromyalgia   Low back pain syndrome   Memory loss   Other and unspecified hyperlipidemia   Pancreatic lesion   Pneumonia   S/P TAVR (transcatheter aortic valve replacement)   26 mm Edwards Sapien 3 via the TF approach  Severe aortic stenosis   Venous insufficiency  Past Surgical History: Past Surgical History: Procedure Laterality Date  ABDOMINAL HYSTERECTOMY    ANTERIOR CERVICAL DISCECTOMY    APPENDECTOMY    CARPAL TUNNEL RELEASE  12/2011  right arm  CATARACT EXTRACTION    CORONARY ATHERECTOMY  07/23/2018  PTCA/orbital atherectomy/DES x 1 proximal to mid LAD  CORONARY ATHERECTOMY N/A 07/23/2018  Procedure: CORONARY ATHERECTOMY;  Surgeon: Burnell Blanks, MD;  Location: [facility];  Service: Cardiovascular;  Laterality: N/A;  CORONARY STENT INTERVENTION    CORONARY STENT INTERVENTION N/A 07/23/2018  Procedure: CORONARY STENT INTERVENTION;  Surgeon: Burnell Blanks, MD;  Location: [facility];  Service: Cardiovascular;  Laterality: N/A;  EYE SURGERY    INTRAMEDULLARY (IM) NAIL INTERTROCHANTERIC Right 09/19/2019  Procedure: INTRAMEDULLARY (IM) NAIL INTERTROCHANTRIC;  Surgeon: Renette Butters, MD;  Location: [facility];  Service: Orthopedics;  Laterality: Right;  LUMBAR LAMINECTOMY    right knee arthroscopy    right shoulder replacement  04/2009  right shoulder surgery  2008  Dr. Noemi Chapel  RIGHT/LEFT HEART CATH AND CORONARY ANGIOGRAPHY N/A 07/07/2018  Procedure: RIGHT/LEFT HEART CATH AND CORONARY ANGIOGRAPHY;  Surgeon:  Burnell Blanks, MD;  Location: [facility];  Service: Cardiovascular;  Laterality: N/A;  TEE WITHOUT CARDIOVERSION N/A 03/29/2019  Procedure: TRANSESOPHAGEAL ECHOCARDIOGRAM (TEE);  Surgeon: Burnell Blanks, MD;  Location: [facility];  Service: Open Heart Surgery;  Laterality: N/A;  TRANSCATHETER AORTIC VALVE REPLACEMENT, TRANSFEMORAL N/A 03/29/2019  Procedure: TRANSCATHETER AORTIC VALVE REPLACEMENT, TRANSFEMORAL;  Surgeon: Burnell Blanks, MD;  Location: [facility];  Service: Open Heart Surgery;  Laterality: N/A;  ULTRASOUND GUIDANCE FOR VASCULAR ACCESS  07/07/2018  Procedure: Ultrasound Guidance For Vascular Access;  Surgeon: Burnell Blanks, MD;  Location: [facility];  Service: Cardiovascular;; HPI: 87 year old female with past medical history of COPD, chronic respiratory failure, dysphagia, diastolic congestive heart failure (Echo 03/2020 EF 70-75%), generalized anxiety disorder, severe aortic regurgitation status post TAVR and coronary artery disease (cardiac cath 06/2018  with arthrectomy and DES to LAD) who presents to [facility] emergency department via EMS due to progressively worsening shortness of breath weakness and confusion. Per chart coughing when eating recently.  MBS 12/16/18: moderate pharyngeal dysphagia marked by delayed initiation of swallow at pyriforms/vallecula, inconsistent clearance of penetration/aspiration, and suspicion of esophageal dysfunction; sensed aspiration unable to clear, Dys 2/nectar recommended.  No data recorded Assessment / Plan / Recommendation CHL IP CLINICAL IMPRESSIONS 06/26/2021 Clinical Impression Pt presents with severe oropharyngeal dysphagia characterized by impaired bolus propulsion, reduced bolus cohesion, reduced lingual retraction, a pharyngeal delay, and reduced anterior laryngeal movement. She demonstrated premature spillage to the valleculae and pyriform sinuses, vallecular residue, pyriform sinus residue, and the swallow  was often triggered with >50% of the bolus at the level of pyriform sinuses. Hardware from ACDF noted and facilitated anterior protrusion of the posterior pharyngeal wall with some mild residue. Aspiration (PAS 7) was noted during deglutition with honey thick and nectar thick liquids via cup, straw, or full tsp. Laryngeal invasion was improved to penetration (PAS 3,5) with 1/2 tsp boluses of honey thick liquids and with cued use of a chin tuck posture; however, pt demonstrated difficulty consistently using a chin tuck posture. Pt often sensed instances of penetration and her independent use of coughing was effective in expelling some of the penetrate prior to deglutition. Pt's cough was ineffective in mobilizing aspirate. Pt's swallow function is notably worse than during the last study on 12/16/18. Pt's risk of aspiration is judged to be high before, during, and after deglutition. Pt's aspiration risk has been thoroughly discussed with the pt's daughter by this SLP and various providers. Pt's current diet will be continued with known aspiration risk and with strict observance of swallowing precautions to attempt to reduce risk. SLP will continue to follow pt. SLP Visit Diagnosis Dysphagia, unspecified (R13.10) Attention and concentration deficit following -- Frontal lobe and executive function deficit following -- Impact on safety and function Severe aspiration risk;Risk for inadequate nutrition/hydration   CHL IP TREATMENT RECOMMENDATION 06/26/2021 Treatment Recommendations Therapy as outlined in treatment plan below   Prognosis 06/26/2021 Prognosis for Safe Diet Advancement Guarded Barriers to Reach Goals Cognitive deficits;Severity of deficits;Time post onset Barriers/Prognosis Comment -- CHL IP DIET RECOMMENDATION 06/26/2021 SLP Diet Recommendations Dysphagia 1 (Puree) solids;Honey thick liquids Liquid Administration via Spoon;No straw Medication Administration Crushed with puree Compensations Slow rate;Small  sips/bites;Follow solids with liquid;Minimize environmental distractions Postural Changes Seated upright at 90 degrees   CHL IP OTHER RECOMMENDATIONS 06/26/2021 Recommended Consults -- Oral Care Recommendations Oral care BID;Staff/trained caregiver to provide oral care Other Recommendations Order thickener from pharmacy   CHL IP FOLLOW UP RECOMMENDATIONS 06/26/2021 Follow up Recommendations Skilled Nursing facility   [facility] IP FREQUENCY AND DURATION 06/26/2021 Speech Therapy Frequency (ACUTE ONLY) min 2x/week Treatment Duration 2 weeks      CHL IP ORAL PHASE 06/26/2021 Oral Phase Impaired Oral - Pudding Teaspoon -- Oral - Pudding Cup -- Oral - Honey Teaspoon Decreased bolus cohesion;Premature spillage;Reduced posterior propulsion Oral - Honey Cup Decreased bolus cohesion;Premature spillage;Reduced posterior propulsion Oral - Nectar Teaspoon Decreased bolus cohesion;Premature spillage;Reduced posterior propulsion Oral - Nectar Cup Decreased velopharyngeal closure Oral - Nectar Straw -- Oral - Thin Teaspoon -- Oral - Thin Cup -- Oral - Thin Straw -- Oral - Puree Decreased bolus cohesion;Premature spillage;Reduced posterior propulsion Oral - Mech Soft -- Oral - Regular -- Oral - Multi-Consistency -- Oral - Pill -- Oral Phase - Comment --  CHL IP PHARYNGEAL PHASE 06/26/2021 Pharyngeal Phase Impaired Pharyngeal- Pudding Teaspoon -- Pharyngeal -- Pharyngeal- Pudding Cup -- Pharyngeal -- Pharyngeal- Honey Teaspoon -- Pharyngeal -- Pharyngeal- Honey Cup Pharyngeal residue - valleculae;Pharyngeal residue - pyriform;Moderate aspiration;Significant aspiration (Amount);Penetration/Aspiration  during swallow;Penetration/Apiration after swallow;Penetration/Aspiration before swallow;Delayed swallow initiation-pyriform sinuses;Reduced anterior laryngeal mobility;Reduced tongue base retraction Pharyngeal Material enters airway, passes BELOW cords and not ejected out despite cough attempt by patient Pharyngeal- Nectar Teaspoon Pharyngeal  residue - valleculae;Pharyngeal residue - pyriform;Moderate aspiration;Significant aspiration (Amount);Penetration/Aspiration during swallow;Penetration/Apiration after swallow;Penetration/Aspiration before swallow;Delayed swallow initiation-pyriform sinuses;Reduced tongue base retraction;Reduced anterior laryngeal mobility Pharyngeal Material enters airway, passes BELOW cords and not ejected out despite cough attempt by patient;Material enters airway, CONTACTS cords and not ejected out;Material enters airway, remains ABOVE vocal cords and not ejected out Pharyngeal- Nectar Cup Pharyngeal residue - valleculae;Pharyngeal residue - pyriform;Moderate aspiration;Significant aspiration (Amount);Penetration/Aspiration during swallow;Penetration/Apiration after swallow;Penetration/Aspiration before swallow;Delayed swallow initiation-pyriform sinuses;Reduced anterior laryngeal mobility;Reduced tongue base retraction Pharyngeal Material enters airway, passes BELOW cords and not ejected out despite cough attempt by patient Pharyngeal- Nectar Straw -- Pharyngeal -- Pharyngeal- Thin Teaspoon -- Pharyngeal -- Pharyngeal- Thin Cup -- Pharyngeal -- Pharyngeal- Thin Straw -- Pharyngeal -- Pharyngeal- Puree Pharyngeal residue - valleculae;Pharyngeal residue - pyriform;Delayed swallow initiation-pyriform sinuses;Reduced tongue base retraction;Reduced anterior laryngeal mobility Pharyngeal -- Pharyngeal- Mechanical Soft -- Pharyngeal -- Pharyngeal- Regular -- Pharyngeal -- Pharyngeal- Multi-consistency -- Pharyngeal -- Pharyngeal- Pill -- Pharyngeal -- Pharyngeal Comment --  CHL IP CERVICAL ESOPHAGEAL PHASE 12/16/2018 Cervical Esophageal Phase Impaired Pudding Teaspoon -- Pudding Cup -- Honey Teaspoon -- Honey Cup -- Nectar Teaspoon -- Nectar Cup -- Nectar Straw -- Thin Teaspoon -- Thin Cup -- Thin Straw -- Puree -- Mechanical Soft -- Regular -- Multi-consistency -- Pill -- Cervical Esophageal Comment -- Shanika I. Hardin Negus, [place],  [place] Office number [phone] Pager [place] 06/26/2021, 3:59 PM                TODAY-DAY OF DISCHARGE:  Subjective:   Noel Gerold today remains comfortable.  Objective:   Blood pressure 127/72, pulse 80, temperature 97.6 F (36.4 C), temperature source Axillary, resp. rate 18, height '5\' 4"'$  (1.626 m), weight 38.3 kg, SpO2 100 %.  Intake/Output Summary (Last 24 hours) at 07/05/2021 1039 Last data filed at 07/05/2021 0832 Gross per 24 hour  Intake --  Output 100 ml  Net -100 ml   Filed Weights   07/02/21 1504 07/03/21 0008  Weight: 47.5 kg 38.3 kg    Exam: Sedated-comfortable.   PERTINENT RADIOLOGIC STUDIES: No results found.   PERTINENT LAB RESULTS: CBC: Recent Labs    07/02/21 1802  HGB 11.9*  HCT 35.0*   CMET CMP     Component Value Date/Time   NA 138 07/02/2021 1802   NA 143 05/11/2019 1438   K 4.7 07/02/2021 1802   CL 100 07/01/2021 0814   CO2 27 07/01/2021 0814   GLUCOSE 91 07/01/2021 0814   BUN 27 (H) 07/01/2021 0814   BUN 8 (L) 05/11/2019 1438   CREATININE 0.66 07/01/2021 0814   CREATININE 0.71 06/19/2020 1536   CALCIUM 9.6 07/01/2021 0814   PROT 5.5 (L) 06/26/2021 0327   ALBUMIN 2.2 (L) 06/26/2021 0327   AST 37 06/26/2021 0327   ALT 28 06/26/2021 0327   ALKPHOS 74 06/26/2021 0327   BILITOT 0.6 06/26/2021 0327   GFRNONAA >60 07/01/2021 0814   GFRNONAA 74 06/19/2020 1536   GFRAA 86 06/19/2020 1536    GFR Estimated Creatinine Clearance: 26.6 mL/min (by C-G formula based on SCr of 0.66 mg/dL). No results for input(s): LIPASE, AMYLASE in the last 72 hours. No results for input(s): CKTOTAL, CKMB, CKMBINDEX, TROPONINI in the last 72 hours. Invalid input(s): [place]  07/02/21 1608  DDIMER 1.41*   No results for input(s): HGBA1C in the last 72 hours. No results for input(s): CHOL, HDL, LDLCALC, TRIG, CHOLHDL, LDLDIRECT in the last 72 hours. No results for input(s): TSH,  T4TOTAL, T3FREE, THYROIDAB in the last 72 hours.  Invalid input(s): FREET3 No results for input(s): VITAMINB12, FOLATE, FERRITIN, TIBC, IRON, RETICCTPCT in the last 72 hours. Coags: No results for input(s): INR in the last 72 hours.  Invalid input(s): PT Microbiology: No results found for this or any previous visit (from the past 240 hour(s)).  FURTHER DISCHARGE INSTRUCTIONS:  Get Medicines reviewed and adjusted: Please take all your medications with you for your next visit with your Primary MD  Laboratory/radiological data: Please request your Primary MD to go over all hospital tests and procedure/radiological results at the follow up, please ask your Primary MD to get all Hospital records sent to his/her office.  In some cases, they will be blood work, cultures and biopsy results pending at the time of your discharge. Please request that your primary care M.D. goes through all the records of your hospital data and follows up on these results.  Also Note the following: If you experience worsening of your admission symptoms, develop shortness of breath, life threatening emergency, suicidal or homicidal thoughts you must seek medical attention immediately by calling 911 or calling your MD immediately  if symptoms less severe.  You must read complete instructions/literature along with all the possible adverse reactions/side effects for all the Medicines you take and that have been prescribed to you. Take any new Medicines after you have completely understood and accpet all the possible adverse reactions/side effects.   Do not drive when taking Pain medications or sleeping medications (Benzodaizepines)  Do not take more than prescribed Pain, Sleep and Anxiety Medications. It is not advisable to combine anxiety,sleep and pain medications without talking with your primary care practitioner  Special Instructions: If you have smoked or chewed Tobacco  in the last 2 yrs please stop smoking,  stop any regular Alcohol  and or any Recreational drug use.  Wear Seat belts while driving.  Please note: You were cared for by a hospitalist during your hospital stay. Once you are discharged, your primary care physician will handle any further medical issues. Please note that NO REFILLS for any discharge medications will be authorized once you are discharged, as it is imperative that you return to your primary care physician (or establish a relationship with a primary care physician if you do not have one) for your post hospital discharge needs so that they can reassess your need for medications and monitor your lab values.  Total Time spent coordinating discharge including counseling, education and face to face time equals 35 minutes.  SignedOren Binet 07/05/2021 10:39 AM

## 2021-07-05 NOTE — Progress Notes (Signed)
Manufacturing engineer (ACC)  Consents completed, [facility] is ready for Ms. Maiello.  Updated TOC manager.  Thank you, Venia Carbon RN, BSN, [facility] Liaison

## 2021-07-05 NOTE — Progress Notes (Signed)
Notified daughter [name] that patient is transferring to [facility] with [facility] now.

## 2021-07-05 NOTE — TOC Transition Note (Signed)
Transition of Care [facility]) - CM/SW Discharge Note   Patient Details  Name: Jessica [name] MRN: [id number] Date of Birth: Jan 26, 1926  Transition of Care [facility]) CM/SW Contact:  Benard Halsted, LCSW Phone Number: 07/05/2021, 11:57 AM   Clinical Narrative:    Patient will DC to: [facility] Anticipated DC date: 07/05/21 Family notified: Daughter Transport by: Corey Harold   Per MD patient ready for DC to Hospice. RN to call report prior to discharge [phone]). RN, patient, patient's family, and facility notified of DC. Discharge Summary sent to facility. DC packet on chart. Ambulance transport requested for patient.   CSW will sign off for now as social work intervention is no longer needed. Please consult [name] again if new needs arise.     Final next level of care: [place] Barriers to Discharge: Barriers Resolved   Patient Goals and CMS Choice Patient states their goals for this hospitalization and ongoing recovery are:: Comfort CMS Medicare.gov Compare Post Acute Care list provided to:: Patient Represenative (must comment) Choice offered to / list presented to : Adult Children  Discharge Placement              Patient chooses bed at: Other - please specify in the comment section below: ([facility]) Patient to be transferred to facility by: [place] Name of family member notified: Daughter Patient and family notified of of transfer: 07/05/21  Discharge Plan and Services In-house Referral: Clinical Social Work, Hospice / Palliative Care   Post Acute Care Choice: Hospice                               Social Determinants of Health (SDOH) Interventions     Readmission Risk Interventions Readmission Risk Prevention Plan 04/01/2019  Post Dischage Appt Complete  Medication Screening Complete  Transportation Screening Complete  Some recent data might be hidden

## 2021-07-05 NOTE — Progress Notes (Signed)
Pt transferred via [facility] to [facility] place with 2L of O2 in place. D/c pt's IV per [facility] place request. Pt has no belongings at bedside. Racheal Patches RN

## 2021-07-05 NOTE — Progress Notes (Signed)
[place] and gave report to [place], Therapist, sports. All questions answered. Will continue to monitor pt. Racheal Patches RN

## 2021-07-16 ENCOUNTER — Telehealth: Payer: Self-pay | Admitting: Family Medicine

## 2021-07-16 NOTE — Telephone Encounter (Signed)
I called to schedule patient AWV. I spoke with Jessica [name] patient daughter.  She stated patient passed away 08-02-21

## 2021-07-22 ENCOUNTER — Ambulatory Visit: Payer: Medicare Other | Admitting: Physician Assistant

## 2021-07-27 DEATH — deceased

## 2021-11-06 ENCOUNTER — Telehealth: Payer: Medicare Other
